# Patient Record
Sex: Female | Born: 1970
Health system: Southern US, Community
[De-identification: ages and names within clinical notes are randomized; demographics above are authoritative.]

## PROBLEM LIST (undated history)

## (undated) DIAGNOSIS — I639 Cerebral infarction, unspecified: Secondary | ICD-10-CM

## (undated) DIAGNOSIS — O34219 Maternal care for unspecified type scar from previous cesarean delivery: Secondary | ICD-10-CM

## (undated) DIAGNOSIS — F445 Conversion disorder with seizures or convulsions: Secondary | ICD-10-CM

## (undated) DIAGNOSIS — G43909 Migraine, unspecified, not intractable, without status migrainosus: Secondary | ICD-10-CM

## (undated) DIAGNOSIS — E785 Hyperlipidemia, unspecified: Secondary | ICD-10-CM

## (undated) DIAGNOSIS — D649 Anemia, unspecified: Secondary | ICD-10-CM

## (undated) DIAGNOSIS — F419 Anxiety disorder, unspecified: Secondary | ICD-10-CM

## (undated) DIAGNOSIS — G4730 Sleep apnea, unspecified: Secondary | ICD-10-CM

## (undated) DIAGNOSIS — F329 Major depressive disorder, single episode, unspecified: Secondary | ICD-10-CM

## (undated) DIAGNOSIS — F32A Depression, unspecified: Secondary | ICD-10-CM

## (undated) DIAGNOSIS — R0600 Dyspnea, unspecified: Secondary | ICD-10-CM

## (undated) DIAGNOSIS — G2581 Restless legs syndrome: Secondary | ICD-10-CM

## (undated) DIAGNOSIS — K589 Irritable bowel syndrome without diarrhea: Secondary | ICD-10-CM

## (undated) DIAGNOSIS — I10 Essential (primary) hypertension: Secondary | ICD-10-CM

## (undated) DIAGNOSIS — K219 Gastro-esophageal reflux disease without esophagitis: Secondary | ICD-10-CM

## (undated) HISTORY — DX: Migraine, unspecified, not intractable, without status migrainosus: G43.909

## (undated) HISTORY — DX: Gastro-esophageal reflux disease without esophagitis: K21.9

## (undated) HISTORY — PX: DILATION AND CURETTAGE OF UTERUS: SHX78

## (undated) HISTORY — DX: Hyperlipidemia, unspecified: E78.5

## (undated) HISTORY — DX: Irritable bowel syndrome, unspecified: K58.9

## (undated) HISTORY — DX: Maternal care for unspecified type scar from previous cesarean delivery: O34.219

---

## 1997-02-18 HISTORY — PX: HERNIA REPAIR: SHX51

## 1997-04-01 ENCOUNTER — Inpatient Hospital Stay (HOSPITAL_COMMUNITY): Admission: AD | Admit: 1997-04-01 | Discharge: 1997-04-02 | Payer: Self-pay | Admitting: Obstetrics and Gynecology

## 1997-04-18 ENCOUNTER — Inpatient Hospital Stay (HOSPITAL_COMMUNITY): Admission: AD | Admit: 1997-04-18 | Discharge: 1997-04-20 | Payer: Self-pay | Admitting: Obstetrics and Gynecology

## 1997-04-21 ENCOUNTER — Inpatient Hospital Stay (HOSPITAL_COMMUNITY): Admission: AD | Admit: 1997-04-21 | Discharge: 1997-04-21 | Payer: Self-pay | Admitting: Obstetrics and Gynecology

## 1997-05-26 ENCOUNTER — Other Ambulatory Visit: Admission: RE | Admit: 1997-05-26 | Discharge: 1997-05-26 | Payer: Self-pay | Admitting: Obstetrics and Gynecology

## 1997-11-17 ENCOUNTER — Encounter: Admission: RE | Admit: 1997-11-17 | Discharge: 1998-02-08 | Payer: Self-pay | Admitting: Anesthesiology

## 1997-11-23 ENCOUNTER — Encounter: Payer: Self-pay | Admitting: Anesthesiology

## 1997-11-23 ENCOUNTER — Ambulatory Visit (HOSPITAL_COMMUNITY): Admission: RE | Admit: 1997-11-23 | Discharge: 1997-11-23 | Payer: Self-pay | Admitting: Anesthesiology

## 2000-02-28 ENCOUNTER — Other Ambulatory Visit: Admission: RE | Admit: 2000-02-28 | Discharge: 2000-02-28 | Payer: Self-pay | Admitting: Obstetrics and Gynecology

## 2001-04-09 ENCOUNTER — Other Ambulatory Visit: Admission: RE | Admit: 2001-04-09 | Discharge: 2001-04-09 | Payer: Self-pay | Admitting: Obstetrics and Gynecology

## 2001-06-16 ENCOUNTER — Ambulatory Visit (HOSPITAL_COMMUNITY): Admission: RE | Admit: 2001-06-16 | Discharge: 2001-06-16 | Payer: Self-pay | Admitting: Obstetrics and Gynecology

## 2002-02-01 ENCOUNTER — Other Ambulatory Visit: Admission: RE | Admit: 2002-02-01 | Discharge: 2002-02-01 | Payer: Self-pay | Admitting: Obstetrics and Gynecology

## 2002-02-20 ENCOUNTER — Inpatient Hospital Stay (HOSPITAL_COMMUNITY): Admission: AD | Admit: 2002-02-20 | Discharge: 2002-02-20 | Payer: Self-pay | Admitting: Obstetrics and Gynecology

## 2002-02-20 ENCOUNTER — Encounter: Payer: Self-pay | Admitting: Obstetrics and Gynecology

## 2003-03-16 ENCOUNTER — Other Ambulatory Visit: Admission: RE | Admit: 2003-03-16 | Discharge: 2003-03-16 | Payer: Self-pay | Admitting: Obstetrics and Gynecology

## 2003-03-18 ENCOUNTER — Ambulatory Visit (HOSPITAL_COMMUNITY): Admission: RE | Admit: 2003-03-18 | Discharge: 2003-03-18 | Payer: Self-pay | Admitting: Obstetrics and Gynecology

## 2004-02-19 HISTORY — PX: KNEE ARTHROSCOPY: SHX127

## 2004-02-28 ENCOUNTER — Ambulatory Visit: Payer: Self-pay

## 2004-04-19 ENCOUNTER — Observation Stay: Payer: Self-pay | Admitting: Obstetrics & Gynecology

## 2004-04-27 ENCOUNTER — Inpatient Hospital Stay: Payer: Self-pay

## 2004-08-22 ENCOUNTER — Ambulatory Visit: Payer: Self-pay | Admitting: Unknown Physician Specialty

## 2004-09-28 ENCOUNTER — Ambulatory Visit: Payer: Self-pay | Admitting: Unknown Physician Specialty

## 2004-09-30 ENCOUNTER — Emergency Department: Payer: Self-pay | Admitting: Emergency Medicine

## 2005-06-01 ENCOUNTER — Ambulatory Visit: Payer: Self-pay | Admitting: Family Medicine

## 2005-07-10 ENCOUNTER — Emergency Department (HOSPITAL_COMMUNITY): Admission: EM | Admit: 2005-07-10 | Discharge: 2005-07-10 | Payer: Self-pay | Admitting: Emergency Medicine

## 2005-08-01 ENCOUNTER — Ambulatory Visit: Payer: Self-pay | Admitting: Family Medicine

## 2005-08-27 ENCOUNTER — Ambulatory Visit: Payer: Self-pay | Admitting: Family Medicine

## 2006-01-09 ENCOUNTER — Inpatient Hospital Stay: Payer: Self-pay | Admitting: Obstetrics & Gynecology

## 2006-10-14 ENCOUNTER — Ambulatory Visit: Payer: Self-pay | Admitting: Family Medicine

## 2007-01-21 ENCOUNTER — Ambulatory Visit: Payer: Self-pay | Admitting: Obstetrics & Gynecology

## 2007-01-22 ENCOUNTER — Ambulatory Visit: Payer: Self-pay | Admitting: Obstetrics & Gynecology

## 2008-01-09 ENCOUNTER — Ambulatory Visit: Payer: Self-pay

## 2008-01-15 ENCOUNTER — Observation Stay: Payer: Self-pay | Admitting: Obstetrics & Gynecology

## 2008-01-20 ENCOUNTER — Inpatient Hospital Stay: Payer: Self-pay

## 2008-03-23 ENCOUNTER — Ambulatory Visit: Payer: Self-pay | Admitting: Surgery

## 2008-03-28 ENCOUNTER — Ambulatory Visit: Payer: Self-pay | Admitting: Surgery

## 2008-03-29 ENCOUNTER — Ambulatory Visit: Payer: Self-pay | Admitting: Surgery

## 2008-08-05 ENCOUNTER — Ambulatory Visit: Payer: Self-pay | Admitting: Family Medicine

## 2008-09-02 ENCOUNTER — Ambulatory Visit: Payer: Self-pay | Admitting: Family Medicine

## 2008-11-14 ENCOUNTER — Encounter: Payer: Self-pay | Admitting: Family Medicine

## 2008-11-18 ENCOUNTER — Encounter: Payer: Self-pay | Admitting: Family Medicine

## 2008-12-19 ENCOUNTER — Encounter: Payer: Self-pay | Admitting: Family Medicine

## 2009-03-07 ENCOUNTER — Ambulatory Visit: Payer: Self-pay | Admitting: Family Medicine

## 2009-07-13 ENCOUNTER — Encounter: Payer: Self-pay | Admitting: Obstetrics and Gynecology

## 2009-08-10 ENCOUNTER — Encounter: Payer: Self-pay | Admitting: Maternal & Fetal Medicine

## 2009-08-18 ENCOUNTER — Encounter: Payer: Self-pay | Admitting: Maternal & Fetal Medicine

## 2009-11-17 ENCOUNTER — Inpatient Hospital Stay: Payer: Self-pay | Admitting: Obstetrics and Gynecology

## 2009-11-21 LAB — PATHOLOGY REPORT

## 2011-02-15 ENCOUNTER — Ambulatory Visit: Payer: Self-pay | Admitting: Vascular Surgery

## 2011-02-19 HISTORY — DX: Maternal care for unspecified type scar from previous cesarean delivery: O34.219

## 2011-02-19 LAB — HM MAMMOGRAPHY: HM MAMMO: NORMAL

## 2011-02-19 NOTE — L&D Delivery Note (Signed)
Delivery Note At 12:57 PM a viable female was delivered via Vaginal, Spontaneous Delivery (Presentation: Left Occiput Anterior).  APGAR: 7,8 ; weight 5 lb 5 oz (2410 g).   Placenta status: Manual removal of placenta with succenturiate lobe , Cord: 3 vessels with the following complications: None.  Cord pH: pending Cord blood collected for private collection.  Anesthesia: Epidural  Episiotomy: None Lacerations: None Suture Repair: n/a Est. Blood Loss (mL): 400  Mom to postpartum.  Baby to NICU.  Alicia Mattie H. 01/24/2012, 1:25 PM

## 2011-02-27 ENCOUNTER — Ambulatory Visit: Payer: Self-pay | Admitting: Obstetrics & Gynecology

## 2011-07-25 ENCOUNTER — Observation Stay: Payer: Self-pay

## 2011-07-25 LAB — ELECTROLYTE PANEL
Anion Gap: 9 (ref 7–16)
Chloride: 107 mmol/L (ref 98–107)
Co2: 25 mmol/L (ref 21–32)
Potassium: 4 mmol/L (ref 3.5–5.1)
Sodium: 141 mmol/L (ref 136–145)

## 2011-07-25 LAB — TSH: Thyroid Stimulating Horm: 0.386 u[IU]/mL — ABNORMAL LOW

## 2012-01-17 ENCOUNTER — Inpatient Hospital Stay: Payer: Self-pay | Admitting: Obstetrics & Gynecology

## 2012-01-18 ENCOUNTER — Inpatient Hospital Stay (HOSPITAL_COMMUNITY)
Admission: AD | Admit: 2012-01-18 | Discharge: 2012-01-25 | DRG: 372 | Disposition: A | Discharge status: Home / Self Care | Payer: BC Managed Care – PPO | Source: Ambulatory Visit | Attending: Family Medicine | Admitting: Family Medicine

## 2012-01-18 ENCOUNTER — Inpatient Hospital Stay (HOSPITAL_COMMUNITY): Payer: BC Managed Care – PPO

## 2012-01-18 ENCOUNTER — Encounter (HOSPITAL_COMMUNITY): Payer: Self-pay | Admitting: Family Medicine

## 2012-01-18 DIAGNOSIS — O34219 Maternal care for unspecified type scar from previous cesarean delivery: Secondary | ICD-10-CM | POA: Diagnosis present

## 2012-01-18 DIAGNOSIS — O09529 Supervision of elderly multigravida, unspecified trimester: Secondary | ICD-10-CM

## 2012-01-18 DIAGNOSIS — O411090 Infection of amniotic sac and membranes, unspecified, unspecified trimester, not applicable or unspecified: Secondary | ICD-10-CM

## 2012-01-18 DIAGNOSIS — O4290 Premature rupture of membranes, unspecified as to length of time between rupture and onset of labor, unspecified weeks of gestation: Secondary | ICD-10-CM

## 2012-01-18 DIAGNOSIS — O42919 Preterm premature rupture of membranes, unspecified as to length of time between rupture and onset of labor, unspecified trimester: Secondary | ICD-10-CM

## 2012-01-18 LAB — OB RESULTS CONSOLE ABO/RH: RH Type: POSITIVE

## 2012-01-18 LAB — CBC WITH DIFFERENTIAL/PLATELET
Basophil #: 0 10*3/uL (ref 0.0–0.1)
Eosinophil %: 0.3 %
Lymphocyte #: 0.9 10*3/uL — ABNORMAL LOW (ref 1.0–3.6)
MCV: 78 fL — ABNORMAL LOW (ref 80–100)
Monocyte #: 0.3 x10 3/mm (ref 0.2–0.9)
Neutrophil %: 87.5 %
Platelet: 251 10*3/uL (ref 150–440)
RBC: 3.67 10*6/uL — ABNORMAL LOW (ref 3.80–5.20)
RDW: 16 % — ABNORMAL HIGH (ref 11.5–14.5)
WBC: 9.1 10*3/uL (ref 3.6–11.0)

## 2012-01-18 LAB — RPR: RPR Ser Ql: NONREACTIVE

## 2012-01-18 LAB — WET PREP, GENITAL: Trich, Wet Prep: NONE SEEN

## 2012-01-18 LAB — OB RESULTS CONSOLE GBS: GBS: NEGATIVE

## 2012-01-18 LAB — CBC
Hemoglobin: 9 g/dL — ABNORMAL LOW (ref 12.0–15.0)
MCH: 25.8 pg — ABNORMAL LOW (ref 26.0–34.0)
Platelets: 205 10*3/uL (ref 150–400)
RBC: 3.49 MIL/uL — ABNORMAL LOW (ref 3.87–5.11)
WBC: 12.9 10*3/uL — ABNORMAL HIGH (ref 4.0–10.5)

## 2012-01-18 LAB — CREATININE, SERUM: GFR calc Af Amer: >90 mL/min (ref >90)

## 2012-01-18 LAB — OB RESULTS CONSOLE PLATELET COUNT: Platelets: 251 10*3/uL

## 2012-01-18 LAB — OB RESULTS CONSOLE HGB/HCT, BLOOD: Hemoglobin: 10 g/dL

## 2012-01-18 LAB — RAPID HIV SCREEN (WH-MAU): Rapid HIV Screen: NONREACTIVE

## 2012-01-18 LAB — OB RESULTS CONSOLE HIV ANTIBODY (ROUTINE TESTING): HIV: NONREACTIVE

## 2012-01-18 LAB — OB RESULTS CONSOLE ANTIBODY SCREEN: Antibody Screen: NEGATIVE

## 2012-01-18 LAB — PREPARE RBC (CROSSMATCH)

## 2012-01-18 MED ORDER — LACTATED RINGERS IV SOLN: 500.0000 mL | INTRAVENOUS | Status: DC | PRN

## 2012-01-18 MED ORDER — BETAMETHASONE SOD PHOS & ACET 6 (3-3) MG/ML IJ SUSP
12.0000 mg | Freq: Once | INTRAMUSCULAR | Status: AC
  Administered 2012-01-18: 12 mg via INTRAMUSCULAR
  Filled 2012-01-18: qty 2

## 2012-01-18 MED ORDER — LACTATED RINGERS IV SOLN: INTRAVENOUS | Status: DC

## 2012-01-18 MED ORDER — OXYCODONE-ACETAMINOPHEN 5-325 MG PO TABS: 1.0000 | ORAL_TABLET | Freq: Once | ORAL | Status: DC

## 2012-01-18 MED ORDER — CITRIC ACID-SODIUM CITRATE 334-500 MG/5ML PO SOLN: 30.0000 mL | ORAL | Status: DC | PRN

## 2012-01-18 MED ORDER — BUTORPHANOL TARTRATE 1 MG/ML IJ SOLN
2.0000 mg | Freq: Once | INTRAMUSCULAR | Status: AC
Start: 2012-01-18 — End: 2012-01-18
  Administered 2012-01-18: 2 mg via INTRAVENOUS

## 2012-01-18 MED ORDER — IBUPROFEN 600 MG PO TABS: 600.0000 mg | ORAL_TABLET | Freq: Four times a day (QID) | ORAL | Status: DC | PRN

## 2012-01-18 MED ORDER — FLEET ENEMA 7-19 GM/118ML RE ENEM: 1.0000 | ENEMA | RECTAL | Status: DC | PRN

## 2012-01-18 MED ORDER — LACTATED RINGERS IV SOLN
INTRAVENOUS | Status: DC
  Administered 2012-01-18 – 2012-01-19 (×3): via INTRAVENOUS

## 2012-01-18 MED ORDER — OXYCODONE HCL 5 MG PO TABS
5.0000 mg | ORAL_TABLET | Freq: Once | ORAL | Status: DC
Start: 2012-01-18 — End: 2012-01-18

## 2012-01-18 MED ORDER — ONDANSETRON HCL 4 MG/2ML IJ SOLN: 4.0000 mg | Freq: Four times a day (QID) | INTRAMUSCULAR | Status: DC | PRN

## 2012-01-18 MED ORDER — CLINDAMYCIN PHOSPHATE 900 MG/50ML IV SOLN
900.0000 mg | Freq: Three times a day (TID) | INTRAVENOUS | Status: DC
  Administered 2012-01-18 – 2012-01-20 (×7): 900 mg via INTRAVENOUS
  Filled 2012-01-18 (×8): qty 50

## 2012-01-18 MED ORDER — OXYTOCIN BOLUS FROM INFUSION: 500.0000 mL | INTRAVENOUS | Status: DC

## 2012-01-18 MED ORDER — ZOLPIDEM TARTRATE 5 MG PO TABS
5.0000 mg | ORAL_TABLET | Freq: Every evening | ORAL | Status: DC | PRN
  Administered 2012-01-18 – 2012-01-23 (×5): 5 mg via ORAL
  Filled 2012-01-18 (×5): qty 1

## 2012-01-18 MED ORDER — DEXTROSE 5 % IV SOLN
160.0000 mg | Freq: Three times a day (TID) | INTRAVENOUS | Status: DC
  Administered 2012-01-18 – 2012-01-20 (×6): 160 mg via INTRAVENOUS
  Filled 2012-01-18 (×8): qty 4

## 2012-01-18 MED ORDER — OXYTOCIN 40 UNITS IN LACTATED RINGERS INFUSION - SIMPLE MED: 62.5000 mL/h | INTRAVENOUS | Status: DC

## 2012-01-18 MED ORDER — AZITHROMYCIN 500 MG PO TABS
1000.0000 mg | ORAL_TABLET | Freq: Once | ORAL | Status: AC
  Administered 2012-01-18: 1000 mg via ORAL
  Filled 2012-01-18: qty 2

## 2012-01-18 MED ORDER — BUTORPHANOL TARTRATE 1 MG/ML IJ SOLN
INTRAMUSCULAR | Status: AC
  Administered 2012-01-18: 2 mg via INTRAVENOUS
  Filled 2012-01-18: qty 2

## 2012-01-18 MED ORDER — LIDOCAINE HCL (PF) 1 % IJ SOLN: 30.0000 mL | INTRAMUSCULAR | Status: DC | PRN

## 2012-01-18 MED ORDER — OXYCODONE-ACETAMINOPHEN 5-325 MG PO TABS: 1.0000 | ORAL_TABLET | ORAL | Status: DC | PRN

## 2012-01-18 MED ORDER — ACETAMINOPHEN 325 MG PO TABS: 650.0000 mg | ORAL_TABLET | ORAL | Status: DC | PRN

## 2012-01-18 NOTE — Consult Note (Signed)
Neonatology Consult  Note:  At the request of the patients obstetrician Dr. Shawnie Pons I met with the surrogate parents of the patient (surrogate mother asleep secondary to stadol).  Birth mother is a 41 y.o. G11 P8 female with EDC 03/07/12 at 33.[redacted] weeks gestation who was admitted for PPROM. Her current obstetrical history is significant for advanced maternal age, prior c-section for twins in 2011, and placenta succenturia.   Pt is a surrogate mother; biological mother with FH of tetrology of Fallot. Fetal echo was scheduled in Sept at [facility] but records not immediately available.  Is on latency antibiotics and betamethasone with the first dose given at [facility] prior to txfer, at 2215 on 11/29. We reviewed initial delivery room management, including CPAP, [place], and low but certainly possible need for intubation for surfactant administration.  We discussed feeding immaturity and need for full po intake with multiple days of good weight gain and no apnea or bradycardia before discharge.  We reviewed increased risk of jaundice, infection, and temperature instability.   Discussed likely length of stay.  Couple have a 47 year old daughter who was a former 33 week prematurity, who is doing well now.  She was in the NICU about 1 month and had problems related to jaundice, feeding and aeneas.  Also have a 41 year old at home.  Reside in [place]. Thank you for allowing [place] to participate in her care.  Please call with questions. Face to face time 30 min.  John Giovanni, DO  Neonatologist

## 2012-01-18 NOTE — Progress Notes (Signed)
Patient ID: Alicia [name], female   DOB: [birth year], 41 y.o.   MRN: [id number] Called to evaluate patient complaining of lowe abdominal pain radiating to both flank. Patient describes generalized cramping pain, no contractions with the addition of a sharp pain in lower abdomen. She reports good fetal movement, no vaginal bleeding.  Filed Vitals:   01/18/12 0826  BP: 111/77  Pulse: 111  Temp: 98.1 F (36.7 C)  Resp: 18   Abd: soft, gravid, NT, non-tender over lower abdomen, no rebound, no guarding Ext: NT, equal in size FHT: baseline 120, moderate variability, +accels, no decels Toco: no contractions  A/P 41 yo with PPROM at 33 weeks and plans to [facility] -FHT category I - will give stadol for pain management - continue close monitoring - Discussed concern for uterine rupture given location of pain. Also discussed possibility of repeat c-section without trial of labor. Patient understands but remains very hesitant about repeat c-section and desires to wait it out a little more

## 2012-01-18 NOTE — Progress Notes (Signed)
ANTIBIOTIC CONSULT NOTE - INITIAL  Pharmacy Consult for gentamicin Indication: PPROM  Allergies  Allergen Reactions  . Penicillins Hives and Swelling    Patient Measurements: Height: 5\' 8"  (172.7 cm) Weight: 185 lb (83.915 kg) IBW/kg (Calculated) : 63.9  Adjusted Body Weight: 70 kg  Vital Signs: Temp: 98 F (36.7 C) (11/30 0609) Temp src: Oral (11/30 0609) BP: 109/57 mmHg (11/30 0609) Pulse Rate: 105  (11/30 0609)  Labs:  Basename 01/18/12 0020  WBC --  HGB 10.0  PLT 251  LABCREA --  CREATININE --   Microbiology: Recent Results (from the past 720 hour(s))  WET PREP, GENITAL     Status: Abnormal   Collection Time   01/18/12  5:35 AM      Component Value Range Status Comment   Yeast Wet Prep HPF POC NONE SEEN  NONE SEEN Final    Trich, Wet Prep NONE SEEN  NONE SEEN Final    Clue Cells Wet Prep HPF POC RARE (*) NONE SEEN Final    WBC, Wet Prep HPF POC FEW (*) NONE SEEN Final MODERATE BACTERIA SEEN   Medications started at [facility] prior to transfer: Clindamycin 900 mg IV q8h last dose at 2300 on 11/29  Gentamicin last dose at 0018 on 11/30 (dose not known at this time)  Assessment: Pt is a 41 yo G11P9 at 33w gestation admitted at [facility] for PPROM. Pt transferred to [facility] due to full NICU at [facility]. Being continued on clindamycin and gentamicin for PPROM.   Goal of Therapy:  Gentamicin peak 6-8 mcg/ml  Trough <1 mcg/ml  Plan:  1. Gentamicin 160mg  IV q8h, first dose at 0800 on 11/30 2. Will check SCr with am labs 3. Will check levels if continued after 3 days  Alicia [name] [place] 01/18/2012,6:16 AM

## 2012-01-18 NOTE — Progress Notes (Signed)
Biological parents at the bedside - providing support to the mother.

## 2012-01-18 NOTE — Progress Notes (Signed)
Patient reports feeling much better. Pain has subsided. She reports good fetal movement, occasional contractions, persistent leakage of fluid and no vaginal bleeding.  Filed Vitals:   01/18/12 1521  BP: 115/47  Pulse: 119  Temp: 98.1 F (36.7 C)  Resp: 20    Abd: Soft, gravid, NT, no reproducible pain on exam Ext: NT, equal in size FHT: baseline 120, moderate variability, +accels, no decels Toco: rare contractions  A/P 41 yo at 33w with PPROM and previous c-section with desire to TOLAC - Fetal status remains category I - Continue current management care - Will return to regular diet  - Second dose of steroids this evening - will check CBG in am

## 2012-01-18 NOTE — H&P (Signed)
I examined pt and agree with documentation above and resident plan of care. MUHAMMAD,Dan Dissinger  

## 2012-01-18 NOTE — Progress Notes (Signed)
Patient doing well this morning, reports occasional contractions. Good fetal movement, no vaginal bleeding.  Filed Vitals:   01/18/12 0703  BP: 110/52  Pulse: 100  Temp:   Resp: 18   Physical exam:  Abd: soft, gravid, NT Ext: NT, equal in size EFM: baseline 125, moderate variability, positive accels, no decels Toco: rare contractions  A/P 41 yo [id number] at 33 weeks with PPROM - Continue monitoring for si/sx of chorio - Continue latency antibiotics- will add 1 dose of azythromycin 1 gm - Second dose of BMX due this evening - Patient with previous c-section secondary to breech twins and desires TOLAC. Reviewed risks and benefits of TOLAC. Patient verbalized understanding and wishes to proceed. Will have patient sign the consent form.

## 2012-01-18 NOTE — H&P (Signed)
Subjective:  GAYLYN BERISH is a 41 y.o. G11 P24 female with EDC 03/07/12 at 33.[redacted] weeks gestation who is being admitted for PPROM.  Her current obstetrical history is significant for advanced maternal age, prior c-section for twins in 2011, and placenta succenturia. Pt presented to [facility] and was transferred here to [facility] due to [facility] N[facility] being full; neonatalogist there recommended transfer and pt was accepted by Dr. Shawnie Pons. Patient reports contractions since around 0200, no bleeding and pain only with contractions, rated 1/10. Fetal Movement: normal.   Followed by Consuella Lose in [place]. Reports no complications with this pregnancy.  ROS: As above. Additionally denies headache/blurry vision, fever/chillls, RUQ pain, new/worse swelling. Denies bleeding. ROM as above.  Objective:   Vital signs in last 24 hours: Temp:  [98.6 F (37 C)] 98.6 F (37 C) (11/30 0405) Pulse Rate:  [101] 101  (11/30 0405) Resp:  [16] 16  (11/30 0405) BP: (97)/(71) 97/71 mmHg (11/30 0405)  General:   alert, cooperative and no distress  Skin:   normal  HEENT:  PERRLA and extra ocular movement intact  Lungs:   clear to auscultation bilaterally and no wheezes  Heart:   regular rate and rhythm, S1, S2 normal, no murmur, click, rub or gallop  Breasts:   exam deferred  Abdomen:  soft, non-tender; bowel sounds normal; no masses,  no organomegaly  Pelvis:  adequate for delivery  FHT: 120 BPM, moderate variability, accels present, rare variable decels  Uterine Size: size equals dates  Presentations: unsure  Cervix: Digital exam deferred due to PPROM; cx visualized 1-2cm by sterile speculum exam                       Lab Review  A, Rh+  AFP:NML  [facility] prenatal screen negative for trisomy 21, 18, and 13  One hour GTT: Normal    Assessment/Plan:  41 y.o. [id number] at 33.[redacted] weeks gestation, presenting with PPROM.  Pregnancy complicated by [facility], prior c-section for twins in 2011, and placenta  succenturia. Pt is a surrogate mother; biological mother with FH of tetrology of Fallot. Fetal echo was scheduled in Sept at [facility] but records not immediately available.  Plan: -admit to birthing suites -abx for PPROM: gentamycin and clindamycin -betamethasone for fetal lung maturity; first dose at [facility] prior to txfer, at 2215 on 11/29 -[place] for presentation -labs  -rapid GBS and HIV screens, RPR  -wet prep, GC/Chlamydia -expectant management  Above was discussed with Roney Marion, CNM and Dr. Shawnie Pons. Bobbye Morton, MD PGY-1, [facility]

## 2012-01-19 DIAGNOSIS — O34219 Maternal care for unspecified type scar from previous cesarean delivery: Secondary | ICD-10-CM

## 2012-01-19 DIAGNOSIS — O4290 Premature rupture of membranes, unspecified as to length of time between rupture and onset of labor, unspecified weeks of gestation: Principal | ICD-10-CM

## 2012-01-19 LAB — GLUCOSE, CAPILLARY: Glucose-Capillary: 132 mg/dL — ABNORMAL HIGH (ref 70–99)

## 2012-01-19 MED ORDER — ACETAMINOPHEN 325 MG PO TABS
650.0000 mg | ORAL_TABLET | Freq: Four times a day (QID) | ORAL | Status: DC | PRN
  Administered 2012-01-19 – 2012-01-24 (×4): 650 mg via ORAL
  Filled 2012-01-19 (×4): qty 2

## 2012-01-19 MED ORDER — GLYBURIDE 2.5 MG PO TABS
2.5000 mg | ORAL_TABLET | Freq: Two times a day (BID) | ORAL | Status: DC
  Administered 2012-01-19 – 2012-01-20 (×4): 2.5 mg via ORAL
  Filled 2012-01-19 (×5): qty 1

## 2012-01-19 MED ORDER — DOCUSATE SODIUM 100 MG PO CAPS
100.0000 mg | ORAL_CAPSULE | Freq: Every day | ORAL | Status: DC
  Administered 2012-01-19 – 2012-01-23 (×5): 100 mg via ORAL
  Filled 2012-01-19 (×5): qty 1

## 2012-01-19 MED ORDER — CALCIUM CARBONATE ANTACID 500 MG PO CHEW: 2.0000 | CHEWABLE_TABLET | ORAL | Status: DC | PRN

## 2012-01-19 MED ORDER — PRENATAL MULTIVITAMIN CH
1.0000 | ORAL_TABLET | Freq: Every day | ORAL | Status: DC
  Administered 2012-01-19 – 2012-01-23 (×5): 1 via ORAL
  Filled 2012-01-19 (×5): qty 1

## 2012-01-19 NOTE — Progress Notes (Signed)
Pt. Sitting on side of bed, back from BR for void & nose bleed - (humidfier needed - per pt.).EFM adjusted - FHR - 130's.

## 2012-01-19 NOTE — Progress Notes (Signed)
Patient ID: Alicia [name], female   DOB: September 14, 1970, 41 y.o.   MRN: [id number] FACULTY PRACTICE ANTEPARTUM(COMPREHENSIVE) NOTE  Alicia [name] is a 41 y.o. [id number] at [redacted]w[redacted]d who is admitted for PPROM.   Fetal presentation is cephalic. Length of Stay:  1  Days  Subjective: Patient reports having a "rough night" secondary to lack of sleep Patient reports the fetal movement as active. Patient reports uterine contraction  activity as none. Patient reports  vaginal bleeding as none. Patient describes fluid per vagina as Other clear and occasionally light pink when she wipes.  Vitals:  Blood pressure 135/70, pulse 140, temperature 98.5 F (36.9 C), temperature source Oral, resp. rate 20, height 5\' 8"  (1.727 m), weight 83.915 kg (185 lb), SpO2 96.00%. Physical Examination:  General appearance - alert, well appearing, and in no distress Fundal Height:  size equals dates Pelvic Exam:  examination not indicated Cervical Exam: Not evaluated.  Extremities: no edema, redness or tenderness in the calves or thighs with DTRs 2+ bilaterally Membranes:ruptured  Fetal Monitoring:  Baseline: 120 bpm, Variability: Good {> 6 bpm), Accelerations: Reactive, Decelerations: Absent and TOCO: 2-3 contractions over night  Labs:  Recent Results (from the past 24 hour(s))  ABO/RH   Collection Time   01/18/12  6:34 AM      Component Value Range   ABO/RH(D) A POS    RPR   Collection Time   01/18/12  6:40 AM      Component Value Range   RPR NON REACTIVE  NON REACTIVE  TYPE AND SCREEN   Collection Time   01/18/12  6:40 AM      Component Value Range   ABO/RH(D) A POS     Antibody Screen NEG     Sample Expiration 01/21/2012     Unit Number [id number]     Blood Component Type RED CELLS,LR     Unit division 00     Status of Unit ALLOCATED     Transfusion Status OK TO TRANSFUSE     Crossmatch Result Compatible     Unit Number [id number]     Blood Component Type RED CELLS,LR     Unit  division 00     Status of Unit ALLOCATED     Transfusion Status OK TO TRANSFUSE     Crossmatch Result Compatible    RAPID HIV SCREEN [facility])   Collection Time   01/18/12  6:40 AM      Component Value Range   SUDS Rapid HIV Screen NON REACTIVE  NON REACTIVE  CREATININE, SERUM   Collection Time   01/18/12  6:40 AM      Component Value Range   Creatinine, Ser 0.55  0.50 - 1.10 mg/dL   GFR calc non Af Amer >90  >90 mL/min   GFR calc Af Amer >90  >90 mL/min  PREPARE RBC (CROSSMATCH)   Collection Time   01/18/12  7:00 AM      Component Value Range   Order Confirmation ORDER PROCESSED BY BLOOD BANK    CBC   Collection Time   01/18/12  1:36 PM      Component Value Range   WBC 12.9 (*) 4.0 - 10.5 K/uL   RBC 3.49 (*) 3.87 - 5.11 MIL/uL   Hemoglobin 9.0 (*) 12.0 - 15.0 g/dL   HCT 13.2 (*) 44.0 - 10.2 %   MCV 79.7  78.0 - 100.0 fL   MCH 25.8 (*) 26.0 - 34.0 pg   MCHC 32.4  30.0 -  36.0 g/dL   RDW 16.1 (*) 09.6 - 04.5 %   Platelets 205  150 - 400 K/uL  GLUCOSE, CAPILLARY   Collection Time   01/19/12  5:59 AM      Component Value Range   Glucose-Capillary 132 (*) 70 - 99 mg/dL    Imaging Studies:     Currently EPIC will not allow sonographic studies to automatically populate into notes.  In the meantime, copy and paste results into note or free text.  Medications:  Scheduled    . [COMPLETED] azithromycin  1,000 mg Oral Once  . [COMPLETED] betamethasone acetate-betamethasone sodium phosphate  12 mg Intramuscular Once  . [COMPLETED] butorphanol  2 mg Intravenous Once  . clindamycin (CLEOCIN) IV  900 mg Intravenous Q8H  . gentamicin  160 mg Intravenous Q8H  . [DISCONTINUED] oxyCODONE  5 mg Oral Once  . [DISCONTINUED] oxyCODONE-acetaminophen  1 tablet Oral Once   I have reviewed the patient's current medications.  ASSESSMENT: There is no problem list on file for this patient.   PLAN: 41 yo with PPROM at [redacted]w[redacted]d s/p betamethasone yesterday evening - Fetal status category I -  No si/sx of chorioamnionitis - Abdominal pain from yesterday is resolved - fasting CBG 132 - Continue latency antibiotics and close monitoring  [name] 01/19/2012,6:29 AM

## 2012-01-20 LAB — GLUCOSE, CAPILLARY
Glucose-Capillary: 80 mg/dL (ref 70–99)
Glucose-Capillary: 129 mg/dL — ABNORMAL HIGH (ref 70–99)
Glucose-Capillary: 72 mg/dL (ref 70–99)
Glucose-Capillary: 91 mg/dL (ref 70–99)

## 2012-01-20 MED ORDER — CEPHALEXIN 500 MG PO CAPS
500.0000 mg | ORAL_CAPSULE | Freq: Four times a day (QID) | ORAL | Status: DC
  Administered 2012-01-20 – 2012-01-23 (×15): 500 mg via ORAL
  Filled 2012-01-20 (×18): qty 1

## 2012-01-20 NOTE — Progress Notes (Signed)
Patient ID: Alicia [name], female   DOB: 1971/01/11, 41 y.o.   MRN: [id number]  FACULTY PRACTICE ANTEPARTUM(COMPREHENSIVE) NOTE  Alicia [name] is a 41 y.o. [id number] at [redacted]w[redacted]d  who is admitted for PPROM .   Length of Stay:  2  Days  Subjective: Patient feels better.  Splept better.  No abdominal pain. Patient reports the fetal movement as active. Patient reports uterine contraction  activity as none. Patient reports  vaginal bleeding as none. Patient describes fluid per vagina as Clear.  Vitals:  Blood pressure 100/51, pulse 99, temperature 97.7 F (36.5 C), temperature source Oral, resp. rate 18, height 5\' 8"  (1.727 m), weight 83.915 kg (185 lb), SpO2 99.00%. Physical Examination:  General appearance - alert, well appearing, and in no distress Mental status - normal mood, behavior, speech, dress, motor activity, and thought processes Abdomen - soft, no fundal tenderness Extremities - no edema, redness or tenderness in the calves or thighs, Homan's sign negative bilaterally  Fetal Monitoring:  Baseline: 130 bpm, Variability: Good {> 6 bpm), Accelerations: Reactive and Decelerations: Absent  Labs:  Recent Results (from the past 24 hour(s))  GLUCOSE, CAPILLARY   Collection Time   01/19/12 11:58 AM      Component Value Range   Glucose-Capillary 112 (*) 70 - 99 mg/dL  GLUCOSE, CAPILLARY   Collection Time   01/19/12  2:27 PM      Component Value Range   Glucose-Capillary 99  70 - 99 mg/dL  GLUCOSE, CAPILLARY   Collection Time   01/19/12  8:27 PM      Component Value Range   Glucose-Capillary 129 (*) 70 - 99 mg/dL  GLUCOSE, CAPILLARY   Collection Time   01/20/12  7:04 AM      Component Value Range   Glucose-Capillary 80  70 - 99 mg/dL     Medications:  Scheduled    . cephALEXin  500 mg Oral Q6H  . docusate sodium  100 mg Oral Daily  . glyBURIDE  2.5 mg Oral Q12H  . prenatal multivitamin  1 tablet Oral Daily  . [DISCONTINUED] clindamycin (CLEOCIN) IV  900 mg  Intravenous Q8H  . [DISCONTINUED] gentamicin  160 mg Intravenous Q8H    ASSESSMENT: Patient Active Problem List  Diagnosis  . Preterm premature rupture of membranes (PPROM) delivered, current hospitalization  . Previous cesarean delivery, delivered, with or without mention of antepartum condition    PLAN: Alicia [name] is a 41 y.o. [id number] at [redacted]w[redacted]d  who is admitted for PPROM No evidence of chorioamnionitis. CBG controlled on glyburide.  If fasting drops, will decrease evening dose to 1.25 mg Change antibiotics to po.  Pt has remote history of hives, itching at age 35 from PCN.  Discussed with patient very low cross reactivity to cephalosporins.  We will try pt on Keflex 500 qid.  If there is any allergic reaction then change to clndamycin 300 mg tid.  Ellinore Merced H. 01/20/2012,7:16 AM

## 2012-01-21 LAB — CBC
Hemoglobin: 8.7 g/dL — ABNORMAL LOW (ref 12.0–15.0)
MCH: 25.5 pg — ABNORMAL LOW (ref 26.0–34.0)
MCV: 80.6 fL (ref 78.0–100.0)
Platelets: 231 10*3/uL (ref 150–400)
RBC: 3.41 MIL/uL — ABNORMAL LOW (ref 3.87–5.11)
WBC: 10.5 10*3/uL (ref 4.0–10.5)

## 2012-01-21 LAB — TYPE AND SCREEN
ABO/RH(D): A POS
Antibody Screen: NEGATIVE
Unit division: 0

## 2012-01-21 LAB — GLUCOSE, CAPILLARY
Glucose-Capillary: 122 mg/dL — ABNORMAL HIGH (ref 70–99)
Glucose-Capillary: 119 mg/dL — ABNORMAL HIGH (ref 70–99)

## 2012-01-21 MED ORDER — GLYBURIDE 1.25 MG PO TABS
1.2500 mg | ORAL_TABLET | Freq: Two times a day (BID) | ORAL | Status: DC
Start: 2012-01-21 — End: 2012-01-24
  Administered 2012-01-21: 11:00:00 via ORAL
  Administered 2012-01-21 – 2012-01-22 (×2): 2.5 mg via ORAL
  Administered 2012-01-22 – 2012-01-23 (×3): 1.25 mg via ORAL
  Filled 2012-01-21 (×9): qty 1

## 2012-01-21 NOTE — Progress Notes (Signed)
Pt reports having more discharge than usual and appears to be pinkish in color, which is a change for her. Dr. Debroah Loop notified and pt placed on EFM and toco.

## 2012-01-21 NOTE — Progress Notes (Signed)
Received call from biological mother/Maureen who states her attorney is requesting a fax number in order to send the hospital a copy of the official birth order.  CSW gave her the fax number to [employer] and asked bedside RN to place it in patient's paper chart when it comes through.

## 2012-01-21 NOTE — Progress Notes (Signed)
01/21/12 1200  Clinical Encounter Type  Visited With Patient (pt is surrogate; adoptive mother Seward Speck also pres)  Visit Type Initial;Spiritual support;Social support  Referral From Nurse  Recommendations Spiritual Care will follow pt and adoptive family for support  Spiritual Encounters  Spiritual Needs Emotional  Stress Factors  Patient Stress Factors Loss of control (Struggling with feelings about unexpected complications)    Made initial visit with Nicole Cella, who is the surrogate mother of this baby, and Seward Speck, who is the adoptive mother.  Marisue Humble is an OB from [place] and has two children at home.  The complications of this pregnancy have brought Nicole Cella and Marisue Humble closer together physically and emotionally as they navigate new territory and get more deeply acquainted.  Provided pastoral listening and reflection, intro to chaplain services, and encouragement to consider whether there is any kind of blessing or ritual that would be helpful to any configuration of family through the planned delivery on Saturday and NICU stay thereafter.  Both Jaylee and Marisue Humble expressed gratitude.  Spiritual Care will continue to follow for support.  [address] [place], [place] [phone]

## 2012-01-21 NOTE — Clinical Social Work Psychosocial (Signed)
    Clinical Social Work Department BRIEF PSYCHOSOCIAL ASSESSMENT 01/20/2012  Patient:  Alicia [name], Alicia [name]     Account Number:  [id number]     Admit date:  01/18/2012  Clinical Social Worker:  Almeta Monas  Date/Time:  01/20/2012 02:30 PM  Referred by:  RN  Date Referred:  01/20/2012 Referred for  Other - See comment   Other Referral:   Surrogacy   Interview type:  Patient Other interview type:    PSYCHOSOCIAL DATA Living Status:  FAMILY Admitted from facility:   Level of care:   Primary support name:  Charlton Haws Primary support relationship to patient:  SPOUSE Degree of support available:   Good supports    CURRENT CONCERNS Current Concerns  None Noted   Other Concerns:    SOCIAL WORK ASSESSMENT / PLAN CSW met with surrogate mother and biological mother to provide support as surrogate mother has now been hospitalized until delivery.  CSW ensured that biological mother has necessary paperwork for the surrogacy.   Assessment/plan status:  Psychosocial Support/Ongoing Assessment of Needs Other assessment/ plan:   Information/referral to community resources:   None needed    PATIENT'S/FAMILY'S RESPONSE TO PLAN OF CARE: Surrogate mother and biological mother are both in good spirits.  They were scheduled to appear before the judge to sign paperwork this Thursday, but since surrogate mother has been hospitalized, they are requesting that paperwork be signed and notorized by CSW in order for them to submit it to the judge.  CSW notorized signatures for surrogate parents and biological mother.  Biological father is in [place] and will have his signature notorized as well.  Both parties have good supports and appear to be coping well with the situation.  Biological mother states her daughter was born prematurely at 97 weeks and spent a month in the NICU in [place].  She and her husband are prepared to travel back and forth from [place] to [place] to be with baby and their two children at  home.  All parties were appreciative of CSW's visit and assistance and state no further questions or needs at this time.  Biological mother understands that CSW will need a copy of the official birth order once it is signed by the judge.

## 2012-01-21 NOTE — Progress Notes (Signed)
Patient ID: ATIYAH BAUER, female   DOB: Aug 18, 1970, 41 y.o.   MRN: [id number]  FACULTY PRACTICE ANTEPARTUM(COMPREHENSIVE) NOTE  DANNIELLE BASKINS is a 41 y.o. [id number] at [redacted]w[redacted]d  who is admitted for PPROM.   Length of Stay:  3  Days  Subjective: Pt is feeling well. Patient reports the fetal movement as active. Patient reports uterine contraction  activity as none. Patient reports  vaginal bleeding as none. Patient describes fluid per vagina as Other blood tinged fluid (pad examined and it is light pink).  Vitals:  Blood pressure 100/48, pulse 100, temperature 97.9 F (36.6 C), temperature source Oral, resp. rate 18, height 5\' 8"  (1.727 m), weight 83.915 kg (185 lb), SpO2 99.00%. Physical Examination:  General appearance - alert, well appearing, and in no distress Mental status - normal mood, behavior, speech, dress, motor activity, and thought processes Chest - clear to auscultation, no wheezes, rales or rhonchi, symmetric air entry Heart - normal rate and regular rhythm Abdomen - soft, no fundal tenderness Extremities - no pedal edema noted, no edema, redness or tenderness in the calves or thighs, Homan's sign negative bilaterally  Fetal Monitoring:  Baseline: 140 bpm, Variability: Good {> 6 bpm), Accelerations: Reactive and Decelerations: Absent  Labs:  Recent Results (from the past 24 hour(s))  GLUCOSE, CAPILLARY   Collection Time   01/20/12 10:39 AM      Component Value Range   Glucose-Capillary 129 (*) 70 - 99 mg/dL   Comment 1 Notify RN     Comment 2 Documented in Chart    GLUCOSE, CAPILLARY   Collection Time   01/20/12  4:19 PM      Component Value Range   Glucose-Capillary 72  70 - 99 mg/dL   Comment 1 Notify RN     Comment 2 Documented in Chart    GLUCOSE, CAPILLARY   Collection Time   01/20/12  7:32 PM      Component Value Range   Glucose-Capillary 91  70 - 99 mg/dL   Comment 1 Notify RN     Comment 2 Documented in Chart    GLUCOSE, CAPILLARY   Collection  Time   01/21/12  6:19 AM      Component Value Range   Glucose-Capillary 65 (*) 70 - 99 mg/dL    Medications:  Scheduled    . cephALEXin  500 mg Oral Q6H  . docusate sodium  100 mg Oral Daily  . glyBURIDE  1.25 mg Oral Q12H  . prenatal multivitamin  1 tablet Oral Daily  . [DISCONTINUED] glyBURIDE  2.5 mg Oral Q12H    ASSESSMENT: Patient Active Problem List  Diagnosis  . Preterm premature rupture of membranes (PPROM) delivered, current hospitalization  . Previous cesarean delivery, delivered, with or without mention of antepartum condition   CBG (last 3)   Basename 01/21/12 0619 01/20/12 1932 01/20/12 1619  GLUCAP 65* 91 72      PLAN: MARGUERITA STAPP is a 41 y.o. [id number] at [redacted]w[redacted]d  who is admitted for PPROM.   1.  Pt tolerating oral Keflex well. 2.  CBG are on the low side.  Will decrease glyburide to 1.25 mg bid.  Will d/c if cbg remain low. 3.  Discussed induction on Saturday morning at 4 a.m. with foley bulb and pitocin.  LEGGETT,KELLY H. 01/21/2012,10:15 AM

## 2012-01-21 NOTE — Progress Notes (Signed)
Pt's fasting CBG was 65. Pt asymptomatic. Pt given juice to drink. No acute distress noted at this time.  Will continue to monitor pt.

## 2012-01-21 NOTE — Progress Notes (Signed)
UR completed 

## 2012-01-22 LAB — VITAMIN B12: Vitamin B-12: 260 pg/mL (ref 211–911)

## 2012-01-22 LAB — GLUCOSE, CAPILLARY
Glucose-Capillary: 86 mg/dL (ref 70–99)
Glucose-Capillary: 162 mg/dL — ABNORMAL HIGH (ref 70–99)
Glucose-Capillary: 129 mg/dL — ABNORMAL HIGH (ref 70–99)
Glucose-Capillary: 128 mg/dL — ABNORMAL HIGH (ref 70–99)

## 2012-01-22 LAB — FERRITIN: Ferritin: 4 ng/mL — ABNORMAL LOW (ref 10–291)

## 2012-01-22 MED ORDER — FERUMOXYTOL INJECTION 510 MG/17 ML
510.0000 mg | Freq: Once | INTRAVENOUS | Status: AC
  Administered 2012-01-22: 510 mg via INTRAVENOUS
  Filled 2012-01-22: qty 17

## 2012-01-22 MED ORDER — LACTATED RINGERS IV SOLN
Freq: Once | INTRAVENOUS | Status: AC
Start: 2012-01-22 — End: 2012-01-23
  Administered 2012-01-23: 23:00:00 via INTRAVENOUS

## 2012-01-22 MED ORDER — TETANUS-DIPHTH-ACELL PERTUSSIS 5-2.5-18.5 LF-MCG/0.5 IM SUSP
0.5000 mL | Freq: Once | INTRAMUSCULAR | Status: AC
  Administered 2012-01-23: 0.5 mL via INTRAMUSCULAR
  Filled 2012-01-22: qty 0.5

## 2012-01-22 NOTE — Progress Notes (Signed)
Patient ID: SHANEEKA SCARBORO, female   DOB: 11-26-1970, 41 y.o.   MRN: [id number]  FACULTY PRACTICE ANTEPARTUM(COMPREHENSIVE) NOTE  MEEKAH MATH is a 41 y.o. [id number] at [redacted]w[redacted]d  who is admitted for PPROM .   Length of Stay:  4  Days  Subjective: No Complaints Patient reports the fetal movement as active. Patient reports uterine contraction  activity as none. Patient reports  vaginal bleeding as none. Patient describes fluid per vagina as Clear.  Vitals:  Blood pressure 104/51, pulse 97, temperature 97.9 F (36.6 C), temperature source Oral, resp. rate 20, height 5\' 8"  (1.727 m), weight 175 lb 12.8 oz (79.742 kg), SpO2 99.00%. Physical Examination:  General appearance - alert, well appearing, and in no distress Abdomen - soft, nontender Extremities - no edema, redness or tenderness in the calves or thighs, Homan's sign negative bilaterally  Fetal Monitoring:  Baseline: 130 bpm, Variability: Good {> 6 bpm), Accelerations: Reactive and Decelerations: Absent  Labs:  Recent Results (from the past 24 hour(s))  GLUCOSE, CAPILLARY   Collection Time   01/21/12  3:25 PM      Component Value Range   Glucose-Capillary 122 (*) 70 - 99 mg/dL   Comment 1 Notify RN     Comment 2 Documented in Chart    GLUCOSE, CAPILLARY   Collection Time   01/21/12  7:53 PM      Component Value Range   Glucose-Capillary 119 (*) 70 - 99 mg/dL   Comment 1 Notify RN     Comment 2 Documented in Chart    GLUCOSE, CAPILLARY   Collection Time   01/22/12  6:50 AM      Component Value Range   Glucose-Capillary 86  70 - 99 mg/dL  GLUCOSE, CAPILLARY   Collection Time   01/22/12 11:14 AM      Component Value Range   Glucose-Capillary 162 (*) 70 - 99 mg/dL   Comment 1 Notify RN     Comment 2 Documented in Chart     CBC    Component Value Date/Time   WBC 10.5 01/21/2012 1003   RBC 3.41* 01/21/2012 1003   HGB 8.7* 01/21/2012 1003   HGB 10.0 01/18/2012 0020   HCT 27.5* 01/21/2012 1003   PLT 231 01/21/2012  1003   PLT 251 01/18/2012 0020   MCV 80.6 01/21/2012 1003   MCH 25.5* 01/21/2012 1003   MCHC 31.6 01/21/2012 1003   RDW 16.5* 01/21/2012 1003    Medications:  Scheduled    . cephALEXin  500 mg Oral Q6H  . docusate sodium  100 mg Oral Daily  . ferumoxytol  510 mg Intravenous Once  . glyBURIDE  1.25 mg Oral Q12H  . prenatal multivitamin  1 tablet Oral Daily  . TDaP  0.5 mL Intramuscular Once    ASSESSMENT: Patient Active Problem List  Diagnosis  . Premature rupture of membranes in pregnancy, antepartum  . Previous cesarean delivery, delivered, with or without mention of antepartum condition    PLAN: CARMALITA WAKEFIELD is a 41 y.o. [id number] at [redacted]w[redacted]d  who is admitted for PPROM  1.  Continue antibiotics 2.  Anemia--will give one dose of IV iron 3.  Dtap today 4.  Flu tomorrow 5.  Plan induction on Saturday morning.  Donnajean Chesnut H. 01/22/2012,2:29 PM

## 2012-01-22 NOTE — Progress Notes (Signed)
Pt requesting NOT to keep and maintain IV site.  No orders at this time to maintain IV site.  IV removed intact.

## 2012-01-22 NOTE — Progress Notes (Signed)
IV started to give medication through.

## 2012-01-23 LAB — CBC WITH DIFFERENTIAL/PLATELET
Basophils Absolute: 0 10*3/uL (ref 0.0–0.1)
Basophils Relative: 0 % (ref 0–1)
Eosinophils Absolute: 0 10*3/uL (ref 0.0–0.7)
MCH: 25.4 pg — ABNORMAL LOW (ref 26.0–34.0)
MCHC: 31.4 g/dL (ref 30.0–36.0)
Neutro Abs: 7.2 10*3/uL (ref 1.7–7.7)
Neutrophils Relative %: 80 % — ABNORMAL HIGH (ref 43–77)
Platelets: 237 10*3/uL (ref 150–400)

## 2012-01-23 LAB — GLUCOSE, CAPILLARY
Glucose-Capillary: 79 mg/dL (ref 70–99)
Glucose-Capillary: 75 mg/dL (ref 70–99)
Glucose-Capillary: 99 mg/dL (ref 70–99)

## 2012-01-23 MED ORDER — CLINDAMYCIN PHOSPHATE 900 MG/50ML IV SOLN
900.0000 mg | Freq: Three times a day (TID) | INTRAVENOUS | Status: DC
  Filled 2012-01-23: qty 50

## 2012-01-23 MED ORDER — TERBUTALINE SULFATE 1 MG/ML IJ SOLN: 0.2500 mg | Freq: Once | INTRAMUSCULAR | Status: AC | PRN

## 2012-01-23 MED ORDER — OXYTOCIN 40 UNITS IN LACTATED RINGERS INFUSION - SIMPLE MED
1.0000 m[IU]/min | INTRAVENOUS | Status: DC
  Administered 2012-01-23: 1 m[IU]/min via INTRAVENOUS
  Administered 2012-01-24: 9 m[IU]/min via INTRAVENOUS
  Filled 2012-01-23: qty 1000

## 2012-01-23 MED ORDER — GENTAMICIN SULFATE 40 MG/ML IJ SOLN
Freq: Three times a day (TID) | INTRAVENOUS | Status: DC
  Administered 2012-01-23 – 2012-01-24 (×2): via INTRAVENOUS
  Filled 2012-01-23 (×4): qty 4.25

## 2012-01-23 MED ORDER — ACETAMINOPHEN 325 MG PO TABS
650.0000 mg | ORAL_TABLET | ORAL | Status: DC
  Administered 2012-01-24: 650 mg via ORAL
  Filled 2012-01-23: qty 2

## 2012-01-23 NOTE — Progress Notes (Addendum)
Alicia [name] is a 42 y.o. [id number] at [redacted]w[redacted]d admitted for PPROM. Now with low grade temp and abdominal/uterine tenderness.   Subjective:   Objective: BP 73/48  Pulse 154  Temp 99.3 F (37.4 C) (Oral)  Resp 18  Ht 5\' 8"  (1.727 m)  Wt 79.742 kg (175 lb 12.8 oz)  BMI 26.73 kg/m2  SpO2 99%      FHT:  FHR: 145 bpm, variability: moderate,  accelerations:  Present,  decelerations:  Absent UC:   none SVE:   Dilation: 1 Effacement (%): Thick Station: -2 Exam by:: Makenzy Krist,cnm  Labs: Lab Results  Component Value Date   WBC 9.0 01/23/2012   HGB 9.1* 01/23/2012   HCT 29.0* 01/23/2012   MCV 81.0 01/23/2012   PLT 237 01/23/2012    Assessment / Plan: IOL 2/2 PROM with presumed chorio  Labor: Foley bulb inserted. Will start pitocin Preeclampsia:  NA Fetal Wellbeing:  Category I Pain Control:  Labor support without medications and desires epidural when needed.  I/D:  n/a Anticipated MOD:  NSVD  Tawnya Crook 01/23/2012, 10:39 PM

## 2012-01-23 NOTE — Progress Notes (Signed)
Patient ID: Alicia [name], female   DOB: [birth year], 41 y.o.   MRN: [id number]  I was called to her room because she was feeling some shaking chills and her temp has changed to 99. She was given tylenol by the nurse. On exam I find her left upper uterus to be very tender. We have discussed the treatment of chorioamnionitis is delivery and antibiotics. The baby currently looks great. U/S at bedside confirms vertex lie.

## 2012-01-23 NOTE — Progress Notes (Signed)
ANTIBIOTIC CONSULT NOTE - INITIAL  Pharmacy Consult for Gentamicin Indication: R/O Chorioamnionitis  Allergies  Allergen Reactions  . Penicillins Hives and Swelling    Patient Measurements: Height: 5\' 8"  (172.7 cm) Weight: 175 lb 12.8 oz (79.742 kg) IBW/kg (Calculated) : 63.9  Adjusted Body Weight: 68.6 kg  Vital Signs: Temp: 99.3 F (37.4 C) (12/05 2112) Temp src: Oral (12/05 2112) BP: 95/47 mmHg (12/05 1952) Pulse Rate: 112  (12/05 1952) I  Labs:  Basename 01/23/12 0915 01/21/12 1003  WBC 9.0 10.5  HGB 9.1* 8.7*  PLT 237 231  LABCREA -- --  CREATININE -- --   Estimated Creatinine Clearance: 102.6 ml/min (by C-G formula based on Cr of 0.55).   Medical History: History reviewed. No pertinent past medical history.  Medications:  Gent/Clindamycin for [facility] 11/30-12/2/13. Clindamycin 900mg  iv q8h restarted 12-5 @ 2200. Assessment: 41yo F at 33weeks admitted with PPROM (transfer from [facility]) and now developed maternal temp while on oral Keflex. Gentamicin and Clindamycin re-started for presumed chorioamnionitis. Goal of Therapy:  Gentamicin peaks 6-8 mcg/ml and troughs < 7mcg/ml.  Plan:  1. Gentamicin 170mg  iv q8h (mixed with Clindamycin). 2. Will continue to follow and assess need for levels as clinically indicated.  Thanks! Claybon Jabs 01/23/2012,10:08 PM

## 2012-01-23 NOTE — Progress Notes (Signed)
Patient ID: Alicia [name], female   DOB: [birth year], 41 y.o.   MRN: [id number]  FACULTY PRACTICE ANTEPARTUM(COMPREHENSIVE) NOTE  Alicia [name] is a 41 y.o. [id number] at [redacted]w[redacted]d  who is admitted for PPROM.   Length of Stay:  5  Days  Subjective: Patient has mild abdominal tenderness. Patient reports the fetal movement as active. Patient reports uterine contraction  activity as none. Patient reports  vaginal bleeding as blood tinged fulid.. Patient describes fluid per vagina as Other pink tinge but mostly clear.  Vitals:  Blood pressure 99/44, pulse 80, temperature 98.3 F (36.8 C), temperature source Oral, resp. rate 20, height 5\' 8"  (1.727 m), weight 175 lb 12.8 oz (79.742 kg), SpO2 99.00%. Physical Examination:  General appearance - alert, well appearing, and in no distress Abdomen - soft, very mild tenderness on fundus but overall a benign exam. No guarding or rebound Extremities - no edema, redness or tenderness in the calves or thighs, Homan's sign negative bilaterally  Fetal Monitoring:  Baseline: 130 bpm, Variability: Good {> 6 bpm), Accelerations: Reactive and Decelerations: one varialbe with one contraction on NST pm on 01/22/12  Labs:  Recent Results (from the past 24 hour(s))  GLUCOSE, CAPILLARY   Collection Time   01/22/12 11:14 AM      Component Value Range   Glucose-Capillary 162 (*) 70 - 99 mg/dL   Comment 1 Notify RN     Comment 2 Documented in Chart    FERRITIN   Collection Time   01/22/12  2:25 PM      Component Value Range   Ferritin 4 (*) 10 - 291 ng/mL  VITAMIN B12   Collection Time   01/22/12  2:25 PM      Component Value Range   Vitamin B-12 260  211 - 911 pg/mL  GLUCOSE, CAPILLARY   Collection Time   01/22/12  3:43 PM      Component Value Range   Glucose-Capillary 129 (*) 70 - 99 mg/dL  GLUCOSE, CAPILLARY   Collection Time   01/22/12  9:50 PM      Component Value Range   Glucose-Capillary 128 (*) 70 - 99 mg/dL  GLUCOSE, CAPILLARY   Collection  Time   01/23/12  6:52 AM      Component Value Range   Glucose-Capillary 79  70 - 99 mg/dL    Medications:  Scheduled    . cephALEXin  500 mg Oral Q6H  . docusate sodium  100 mg Oral Daily  . [COMPLETED] ferumoxytol  510 mg Intravenous Once  . glyBURIDE  1.25 mg Oral Q12H  . lactated ringers   Intravenous Once  . prenatal multivitamin  1 tablet Oral Daily  . TDaP  0.5 mL Intramuscular Once    ASSESSMENT: Patient Active Problem List  Diagnosis  . Premature rupture of membranes in pregnancy, antepartum  . Previous cesarean delivery, delivered, with or without mention of antepartum condition    PLAN: RONAN DION is a 41 y.o. [id number] at [redacted]w[redacted]d  who is admitted for PPROM  1.  Continue bid NST 2.  Iron deficiency anemia.  Ferritin = 4.  S/p IV iron yesterday. 3.  Still needs tDap (was ordered yesterday). 4.  CBC 5.  Will watch for signs of chorio and deliver if indicated.  LEGGETT,KELLY H. 01/23/2012,8:18 AM

## 2012-01-24 ENCOUNTER — Encounter (HOSPITAL_COMMUNITY): Payer: Self-pay | Admitting: *Deleted

## 2012-01-24 ENCOUNTER — Encounter (HOSPITAL_COMMUNITY): Payer: Self-pay | Admitting: Anesthesiology

## 2012-01-24 ENCOUNTER — Inpatient Hospital Stay (HOSPITAL_COMMUNITY): Payer: BC Managed Care – PPO | Admitting: Anesthesiology

## 2012-01-24 LAB — HEPATITIS B SURFACE ANTIGEN: Hepatitis B Surface Ag: NEGATIVE

## 2012-01-24 LAB — RPR: RPR Ser Ql: NONREACTIVE

## 2012-01-24 MED ORDER — DIPHENHYDRAMINE HCL 50 MG/ML IJ SOLN: 12.5000 mg | INTRAMUSCULAR | Status: DC | PRN

## 2012-01-24 MED ORDER — SODIUM BICARBONATE 8.4 % IV SOLN
INTRAVENOUS | Status: DC | PRN
  Administered 2012-01-24: 5 mL via EPIDURAL

## 2012-01-24 MED ORDER — BENZOCAINE-MENTHOL 20-0.5 % EX AERO: 1.0000 "application " | INHALATION_SPRAY | CUTANEOUS | Status: DC | PRN

## 2012-01-24 MED ORDER — PHENYLEPHRINE 40 MCG/ML (10ML) SYRINGE FOR IV PUSH (FOR BLOOD PRESSURE SUPPORT): 80.0000 ug | PREFILLED_SYRINGE | INTRAVENOUS | Status: DC | PRN

## 2012-01-24 MED ORDER — IBUPROFEN 600 MG PO TABS
600.0000 mg | ORAL_TABLET | Freq: Four times a day (QID) | ORAL | Status: DC | PRN
  Administered 2012-01-24: 600 mg via ORAL
  Filled 2012-01-24: qty 1

## 2012-01-24 MED ORDER — DIPHENHYDRAMINE HCL 25 MG PO CAPS: 25.0000 mg | ORAL_CAPSULE | Freq: Four times a day (QID) | ORAL | Status: DC | PRN

## 2012-01-24 MED ORDER — CLINDAMYCIN PHOSPHATE 900 MG/50ML IV SOLN: 900.0000 mg | Freq: Three times a day (TID) | INTRAVENOUS | Status: DC

## 2012-01-24 MED ORDER — EPHEDRINE 5 MG/ML INJ
10.0000 mg | INTRAVENOUS | Status: DC | PRN
  Filled 2012-01-24: qty 4

## 2012-01-24 MED ORDER — BUPIVACAINE HCL (PF) 0.25 % IJ SOLN
INTRAMUSCULAR | Status: DC | PRN
  Administered 2012-01-24 (×2): 5 mL via EPIDURAL

## 2012-01-24 MED ORDER — ONDANSETRON HCL 4 MG/2ML IJ SOLN: 4.0000 mg | Freq: Four times a day (QID) | INTRAMUSCULAR | Status: DC | PRN

## 2012-01-24 MED ORDER — PRENATAL MULTIVITAMIN CH
1.0000 | ORAL_TABLET | Freq: Every day | ORAL | Status: DC
  Administered 2012-01-24: 1 via ORAL
  Filled 2012-01-24: qty 1

## 2012-01-24 MED ORDER — IBUPROFEN 600 MG PO TABS
600.0000 mg | ORAL_TABLET | Freq: Four times a day (QID) | ORAL | Status: DC
  Administered 2012-01-24 – 2012-01-25 (×3): 600 mg via ORAL
  Filled 2012-01-24 (×3): qty 1

## 2012-01-24 MED ORDER — MEASLES, MUMPS & RUBELLA VAC ~~LOC~~ INJ: 0.5000 mL | INJECTION | Freq: Once | SUBCUTANEOUS | Status: DC

## 2012-01-24 MED ORDER — EPHEDRINE 5 MG/ML INJ: 10.0000 mg | INTRAVENOUS | Status: DC | PRN

## 2012-01-24 MED ORDER — TETANUS-DIPHTH-ACELL PERTUSSIS 5-2.5-18.5 LF-MCG/0.5 IM SUSP: 0.5000 mL | Freq: Once | INTRAMUSCULAR | Status: DC

## 2012-01-24 MED ORDER — FENTANYL CITRATE 0.05 MG/ML IJ SOLN
INTRAMUSCULAR | Status: AC
  Filled 2012-01-24: qty 2

## 2012-01-24 MED ORDER — GENTAMICIN SULFATE 40 MG/ML IJ SOLN
160.0000 mg | Freq: Three times a day (TID) | INTRAVENOUS | Status: DC
  Filled 2012-01-24 (×2): qty 4

## 2012-01-24 MED ORDER — ONDANSETRON HCL 4 MG PO TABS: 4.0000 mg | ORAL_TABLET | ORAL | Status: DC | PRN

## 2012-01-24 MED ORDER — ONDANSETRON HCL 4 MG/2ML IJ SOLN: 4.0000 mg | INTRAMUSCULAR | Status: DC | PRN

## 2012-01-24 MED ORDER — LACTATED RINGERS IV SOLN: 500.0000 mL | INTRAVENOUS | Status: DC | PRN

## 2012-01-24 MED ORDER — GENTAMICIN SULFATE 40 MG/ML IJ SOLN
Freq: Three times a day (TID) | INTRAVENOUS | Status: DC
  Administered 2012-01-24 – 2012-01-25 (×2): via INTRAVENOUS
  Filled 2012-01-24 (×3): qty 4

## 2012-01-24 MED ORDER — FENTANYL CITRATE 0.05 MG/ML IJ SOLN
100.0000 ug | Freq: Once | INTRAMUSCULAR | Status: AC
  Administered 2012-01-24: 100 ug via EPIDURAL

## 2012-01-24 MED ORDER — LIDOCAINE HCL (PF) 1 % IJ SOLN: 30.0000 mL | INTRAMUSCULAR | Status: DC | PRN

## 2012-01-24 MED ORDER — LANOLIN HYDROUS EX OINT: TOPICAL_OINTMENT | CUTANEOUS | Status: DC | PRN

## 2012-01-24 MED ORDER — LACTATED RINGERS IV SOLN
INTRAVENOUS | Status: DC
  Administered 2012-01-24: 09:00:00 via INTRAVENOUS

## 2012-01-24 MED ORDER — DIBUCAINE 1 % RE OINT: 1.0000 "application " | TOPICAL_OINTMENT | RECTAL | Status: DC | PRN

## 2012-01-24 MED ORDER — PHENYLEPHRINE 40 MCG/ML (10ML) SYRINGE FOR IV PUSH (FOR BLOOD PRESSURE SUPPORT)
80.0000 ug | PREFILLED_SYRINGE | INTRAVENOUS | Status: DC | PRN
  Filled 2012-01-24: qty 5

## 2012-01-24 MED ORDER — OXYTOCIN BOLUS FROM INFUSION: 500.0000 mL | INTRAVENOUS | Status: DC

## 2012-01-24 MED ORDER — FENTANYL 2.5 MCG/ML BUPIVACAINE 1/10 % EPIDURAL INFUSION (WH - ANES)
14.0000 mL/h | INTRAMUSCULAR | Status: DC
  Administered 2012-01-24: 14 mL/h via EPIDURAL
  Filled 2012-01-24 (×2): qty 125

## 2012-01-24 MED ORDER — WITCH HAZEL-GLYCERIN EX PADS: 1.0000 "application " | MEDICATED_PAD | CUTANEOUS | Status: DC | PRN

## 2012-01-24 MED ORDER — CITRIC ACID-SODIUM CITRATE 334-500 MG/5ML PO SOLN: 30.0000 mL | ORAL | Status: DC | PRN

## 2012-01-24 MED ORDER — OXYCODONE-ACETAMINOPHEN 5-325 MG PO TABS
1.0000 | ORAL_TABLET | ORAL | Status: DC | PRN
  Administered 2012-01-24 – 2012-01-25 (×4): 1 via ORAL
  Filled 2012-01-24: qty 1
  Filled 2012-01-24: qty 2
  Filled 2012-01-24 (×2): qty 1

## 2012-01-24 MED ORDER — OXYTOCIN 40 UNITS IN LACTATED RINGERS INFUSION - SIMPLE MED: 62.5000 mL/h | INTRAVENOUS | Status: DC

## 2012-01-24 MED ORDER — SIMETHICONE 80 MG PO CHEW: 80.0000 mg | CHEWABLE_TABLET | ORAL | Status: DC | PRN

## 2012-01-24 MED ORDER — LACTATED RINGERS IV SOLN
500.0000 mL | Freq: Once | INTRAVENOUS | Status: AC
  Administered 2012-01-24: 500 mL via INTRAVENOUS

## 2012-01-24 MED ORDER — OXYCODONE-ACETAMINOPHEN 5-325 MG PO TABS
1.0000 | ORAL_TABLET | ORAL | Status: DC | PRN
  Administered 2012-01-24: 2 via ORAL
  Filled 2012-01-24: qty 2

## 2012-01-24 NOTE — Anesthesia Postprocedure Evaluation (Signed)
  Anesthesia Post-op Note  Patient: Alicia [name]  Procedure(s) Performed: * No procedures listed *  Patient Location: Women's Unit  Anesthesia Type:Epidural  Level of Consciousness: awake  Airway and Oxygen Therapy: Patient Spontanous Breathing  Post-op Pain: mild  Post-op Assessment: Patient's Cardiovascular Status Stable and Respiratory Function Stable  Post-op Vital Signs: stable  Complications: No apparent anesthesia complications

## 2012-01-24 NOTE — Anesthesia Preprocedure Evaluation (Signed)
Anesthesia Evaluation  Patient identified by MRN, date of birth, ID band Patient awake    Reviewed: Allergy & Precautions, H&P , NPO status , Patient's Chart, lab work & pertinent test results  Airway Mallampati: I TM Distance: >3 FB Neck ROM: full    Dental No notable dental hx.    Pulmonary neg pulmonary ROS,    Pulmonary exam normal       Cardiovascular negative cardio ROS      Neuro/Psych negative neurological ROS  negative psych ROS   GI/Hepatic negative GI ROS, Neg liver ROS,   Endo/Other  negative endocrine ROS  Renal/GU negative Renal ROS  negative genitourinary   Musculoskeletal negative musculoskeletal ROS (+)   Abdominal Normal abdominal exam  (+)   Peds negative pediatric ROS (+)  Hematology negative hematology ROS (+)   Anesthesia Other Findings   Reproductive/Obstetrics (+) Pregnancy                           Anesthesia Physical Anesthesia Plan  ASA: II  Anesthesia Plan: Epidural   Post-op Pain Management:    Induction:   Airway Management Planned:   Additional Equipment:   Intra-op Plan:   Post-operative Plan:   Informed Consent: I have reviewed the patients History and Physical, chart, labs and discussed the procedure including the risks, benefits and alternatives for the proposed anesthesia with the patient or authorized representative who has indicated his/her understanding and acceptance.     Plan Discussed with:   Anesthesia Plan Comments:         Anesthesia Quick Evaluation  

## 2012-01-24 NOTE — Progress Notes (Signed)
Alicia [name] is a 41 y.o. [id number] at [redacted]w[redacted]d admitted for PPROM at 33 weeks.  Subjective: Pt comfortable with epidural.  Having some pain between shoulder blades.  Objective: BP 121/63  Pulse 115  Temp 99 F (37.2 C) (Oral)  Resp 18  Ht 5\' 8"  (1.727 m)  Wt 175 lb 12.8 oz (79.742 kg)  BMI 26.73 kg/m2  SpO2 99%      FHT:  FHR: 145 bpm, variability: moderate,  accelerations:  Present,  decelerations:  Present occasional variable UC:   regular, every 4 minutes; MVUs are 150 SVE:   Dilation: 5.5 Effacement (%): 50 Station: -3 Exam by:: Alicia [name]  Labs: Lab Results  Component Value Date   WBC 9.0 01/23/2012   HGB 9.1* 01/23/2012   HCT 29.0* 01/23/2012   MCV 81.0 01/23/2012   PLT 237 01/23/2012    Assessment / Plan: Induction of labor due to chorioamnioitis,  progressing well on pitocin  Labor: slow progression, not having adequate contraction yet. Preeclampsia:  no signs or symptoms of toxicity Fetal Wellbeing:  Category I Pain Control:  Epidural I/D:  temp 99.  On gent and clinda Anticipated MOD:  NSVD  Alicia [name] H. 01/24/2012, 10:00 AM

## 2012-01-25 LAB — CBC
MCH: 25.6 pg — ABNORMAL LOW (ref 26.0–34.0)
MCV: 79.5 fL (ref 78.0–100.0)
Platelets: 175 10*3/uL (ref 150–400)
RDW: 16.6 % — ABNORMAL HIGH (ref 11.5–15.5)

## 2012-01-25 LAB — BASIC METABOLIC PANEL
BUN: 7 mg/dL (ref 6–23)
CO2: 26 mEq/L (ref 19–32)
Calcium: 9.2 mg/dL (ref 8.4–10.5)
Creatinine, Ser: 0.88 mg/dL (ref 0.50–1.10)
GFR calc Af Amer: >90 mL/min (ref >90)

## 2012-01-25 MED ORDER — OXYCODONE-ACETAMINOPHEN 5-325 MG PO TABS: 1.0000 | ORAL_TABLET | ORAL | Status: DC | PRN

## 2012-01-25 MED ORDER — IBUPROFEN 600 MG PO TABS: 600.0000 mg | ORAL_TABLET | Freq: Four times a day (QID) | ORAL | Status: DC

## 2012-01-25 NOTE — Discharge Summary (Signed)
Obstetric Discharge Summary Reason for Admission: rupture of membranes Prenatal Procedures: ultrasound Intrapartum Procedures: spontaneous vaginal delivery Postpartum Procedures: manual placenta extraction Complications-Operative and Postpartum: as above Hemoglobin  Date Value Range Status  01/25/2012 10.0* 12.0 - 15.0 g/dL Final  40/98/1191 47.8   Final     HCT  Date Value Range Status  01/25/2012 31.1* 36.0 - 46.0 % Final    Physical Exam:  General: alert, cooperative and no distress Lochia: appropriate Uterine Fundus: firm Incision: na DVT Evaluation: No evidence of DVT seen on physical exam.  Discharge Diagnoses: preterm delivery  Discharge Information: Date: 01/25/2012 Activity: pelvic rest Diet: routine Medications: Ibuprofen and Percocet Condition: stable Instructions: refer to practice specific booklet Discharge to: home Follow-up Information    Follow up with Physician at [facility]. In 6 weeks.         Newborn Data: Live born female  Birth Weight: 5 lb 5 oz (2410 g) APGAR: 7, 8  Home with surrogate delivery.  EURE,LUTHER H 01/25/2012, 9:16 AM

## 2012-01-25 NOTE — Plan of Care (Signed)
Problem: Phase I Progression Outcomes Goal: Initial discharge plan identified Outcome: Completed/Met Date Met:  01/25/12 Pain control understands Self care F/u care

## 2012-01-25 NOTE — Progress Notes (Signed)
Discharge instructions reviewed with patient.  Patient states understanding of home care and signs/symptoms to report to MD.  No home equipment needed.  Ambulated to car with staff without incident and discharged home with husband.

## 2012-01-27 LAB — TYPE AND SCREEN
ABO/RH(D): A POS
Antibody Screen: NEGATIVE
Unit division: 0
Unit division: 0

## 2012-08-18 LAB — HM PAP SMEAR: HM PAP: NORMAL

## 2012-11-06 ENCOUNTER — Ambulatory Visit (INDEPENDENT_AMBULATORY_CARE_PROVIDER_SITE_OTHER): Payer: BC Managed Care – PPO | Admitting: Internal Medicine

## 2012-11-06 ENCOUNTER — Encounter: Payer: Self-pay | Admitting: Internal Medicine

## 2012-11-06 VITALS — BP 112/72 | HR 99 | Ht 69.0 in | Wt 170.2 lb

## 2012-11-06 DIAGNOSIS — R002 Palpitations: Secondary | ICD-10-CM

## 2012-11-06 DIAGNOSIS — R079 Chest pain, unspecified: Secondary | ICD-10-CM

## 2012-11-06 DIAGNOSIS — R0600 Dyspnea, unspecified: Secondary | ICD-10-CM

## 2012-11-06 DIAGNOSIS — R0609 Other forms of dyspnea: Secondary | ICD-10-CM

## 2012-11-06 NOTE — Progress Notes (Signed)
OFFICE NOTE  Chief Complaint:  Chest pain, epigastric pain, dyspnea on exertion  Primary Care Physician: Tommy Rainwater, MD  HPI:  Alicia [name] is a pleasant 42 year old female with a history of 3 pregnancies, the second was 20 and the superior chest there is a history of anxiety, insomnia, questionable IBS and restless leg syndrome, GERD and hyperlipidemia not on medication. She is concerned about symptoms of chest pressure and associated shortness of breath with exertion. She reports her shortness of breath is significant enough that she avoids going up stairs in her house. She also reports some substernal chest heaviness as well as some upper mid epigastric pain. In 2007 she had similar symptoms and underwent an abdominal ultrasound. I looked for results of that was only able to find an ultrasound in 2005 which was essentially normal. At that time however, she reported that she was told that her artery and vein "twisted or wrapped around each other". The organs are located on the right side to the abdominal cavity therefore I think sinus inversus is unlikely. Interestingly, there is a history of congenital heart disease in her father. She also reported significant difficulty with each pregnancy including marked shortness of breath and tachycardia. In 2011 she underwent an echocardiogram which was reportedly normal. She is here for evaluation of possible congenital heart disease.  PMHx:  Past Medical History  Diagnosis Date  . Hyperlipidemia   . IBS (irritable bowel syndrome)   . GERD (gastroesophageal reflux disease)     Past Surgical History  Procedure Laterality Date  . Cesarean section  2011  . Knee surgery  2006  . Hernia repair  1999  . Dilation and curettage of uterus  2003, 2005, 2008    FAMHx:  Family History  Problem Relation Age of Onset  . Hypertension Mother   . Hyperlipidemia Mother   . Heart Problems Father     hole in heart  . Prostate cancer Maternal  Grandfather     SOCHx:   reports that she has never smoked. She has never used smokeless tobacco. She reports that she does not drink alcohol or use illicit drugs.  ALLERGIES:  Allergies  Allergen Reactions  . Penicillins Hives and Swelling    ROS: A comprehensive review of systems was negative except for: Constitutional: positive for fatigue Cardiovascular: positive for dyspnea Gastrointestinal: positive for abdominal pain and reflux symptoms Behavioral/Psych: positive for anxiety  HOME MEDS: Current Outpatient Prescriptions  Medication Sig Dispense Refill  . ALPRAZolam (XANAX) 0.5 MG tablet Take 0.5 mg by mouth as needed.      . cyclobenzaprine (FLEXERIL) 10 MG tablet Take 10 mg by mouth daily.      . fenofibrate micronized (ANTARA) 130 MG capsule Take 130 mg by mouth daily before breakfast.      . ibuprofen (ADVIL,MOTRIN) 600 MG tablet Take 1 tablet (600 mg total) by mouth every 6 (six) hours.  30 tablet  0  . KARIVA 0.15-0.02/0.01 MG (21/5) tablet daily.      . Multiple Vitamins-Minerals (CENTRUM PO) Take by mouth daily.      Marland Kitchen PARoxetine (PAXIL) 10 MG tablet Take 10 mg by mouth daily.      Marland Kitchen zolpidem (AMBIEN) 10 MG tablet Take 1 tablet by mouth at bedtime as needed.       No current facility-administered medications for this visit.    LABS/IMAGING: No results found for this or any previous visit (from the past 48 hour(s)). No results found.  VITALS:  BP 112/72  Pulse 99  Ht 5\' 9"  (1.753 m)  Wt 170 lb 3.2 oz (77.202 kg)  BMI 25.12 kg/m2  EXAM: General appearance: alert and no distress Neck: no adenopathy, no carotid bruit, no JVD, supple, symmetrical, trachea midline and thyroid not enlarged, symmetric, no tenderness/mass/nodules Lungs: clear to auscultation bilaterally Heart: regular rate and rhythm, S1, S2 normal, no murmur, click, rub or gallop Abdomen: soft, non-tender; bowel sounds normal; no masses,  no organomegaly Extremities: extremities normal,  atraumatic, no cyanosis or edema Pulses: 2+ and symmetric Skin: Skin color, texture, turgor normal. No rashes or lesions Neurologic: Grossly normal  EKG: Sinus rhythm at 99, fusion beat  ASSESSMENT: 1. Progressive dyspnea on exertion 2. Chest and upper abdominal pain 3. Anxiety  PLAN: 1.   Mrs. Alicia [name] is describing increasing shortness of breath which is lifestyle limiting. Alicia [name] describes intermittent chest heaviness and a separate upper mid epigastric pain. These are not necessarily associated with exertion, relieved by rest or after a meal. She is concerned given her father's history of congenital heart disease that there may have been some congenital heart defect that is missed. She has very few risk factors for acquired coronary artery disease. She was on hormone therapy to help with pregnancies given her advanced gestational age. This could be a risk factor for heart disease. I do not appreciate any murmurs on exam today. However given her dyspnea, would like to have her undergo an echocardiogram with bubble study as well as a cardiometabolic stress test. A plan to see her back to discuss results of these studies when they're completed.  Chrystie Nose, MD, [facility] Attending Cardiologist [facility]  HILTY,Kenneth C 11/06/2012, 1:26 PM

## 2012-11-06 NOTE — Patient Instructions (Addendum)
Your physician has requested that you have an echocardiogram. Echocardiography is a painless test that uses sound waves to create images of your heart. It provides your doctor with information about the size and shape of your heart and how well your heart's chambers and valves are working. This procedure takes approximately one hour. There are no restrictions for this procedure.  Dr. Rennis Golden has ordered a metabolic test that also looks at your lung function.   The Cardiopulmonary Exercise Test is a highly sensitive, non-invasive stress test. It is considered a stress test because the exercise stresses your body's systems by making them work faster and harder. A disease or condition that affects the heart, lungs or muscles will limit how much faster and harder these systems can work. A CPET assesses how well the heart, lungs, and muscles are working individually, and how these systems are working in unison. Your heart and lungs work together to deliver oxygen to your muscles, where it is used to make energy, and to remove carbon dioxide from your body.  The full cardiopulmonary system is assessed during a CPET by measuring the amount of oxygen your body is using, the amount of carbon dioxide it is producing, your breathing pattern, and electrocardiogram (EKG) while you are riding a stationary bicycle.  The traditional treadmill stress test only relies on the EKG, which only partially assesses the heart and nothing else. Besides detecting problems in multiple body systems, the CPET is also used to monitor changes in your disease condition, the effect of certain medications on your body, and if medical therapy is improving your condition  A Pulmonary Function Test is a series of five different tests that measure the different volumes within your lungs, how easily you can move air in and out of your lungs, and how well your lungs are able to transfer oxygen into the rest of your body. The results of these tests  can help your doctor assess how your lungs are functioning at rest. If there is a problem, the results will help your doctor determine what is wrong and the appropriate course of treatment.   We will schedule a follow up visit with Dr. Rennis Golden after he has reviewed the results of these tests.

## 2012-11-13 ENCOUNTER — Ambulatory Visit: Payer: Self-pay | Admitting: Obstetrics & Gynecology

## 2012-11-16 ENCOUNTER — Encounter (INDEPENDENT_AMBULATORY_CARE_PROVIDER_SITE_OTHER): Payer: BC Managed Care – PPO

## 2012-11-16 DIAGNOSIS — R0602 Shortness of breath: Secondary | ICD-10-CM

## 2012-11-17 ENCOUNTER — Encounter: Payer: Self-pay | Admitting: Internal Medicine

## 2012-11-18 ENCOUNTER — Ambulatory Visit (HOSPITAL_COMMUNITY)
Admission: RE | Admit: 2012-11-18 | Discharge: 2012-11-18 | Disposition: A | Discharge status: Home / Self Care | Payer: BC Managed Care – PPO | Source: Ambulatory Visit | Attending: Cardiology | Admitting: Cardiology

## 2012-11-18 DIAGNOSIS — R0609 Other forms of dyspnea: Secondary | ICD-10-CM | POA: Insufficient documentation

## 2012-11-18 DIAGNOSIS — R0989 Other specified symptoms and signs involving the circulatory and respiratory systems: Secondary | ICD-10-CM | POA: Insufficient documentation

## 2012-11-18 DIAGNOSIS — R0600 Dyspnea, unspecified: Secondary | ICD-10-CM

## 2012-11-18 DIAGNOSIS — R079 Chest pain, unspecified: Secondary | ICD-10-CM

## 2012-11-18 DIAGNOSIS — R002 Palpitations: Secondary | ICD-10-CM | POA: Insufficient documentation

## 2012-11-18 NOTE — Progress Notes (Signed)
2D Echo Performed 11/18/2012    Rafeal Skibicki, RCS  

## 2012-11-25 ENCOUNTER — Encounter: Payer: Self-pay | Admitting: Internal Medicine

## 2012-11-25 ENCOUNTER — Ambulatory Visit (INDEPENDENT_AMBULATORY_CARE_PROVIDER_SITE_OTHER): Payer: BC Managed Care – PPO | Admitting: Internal Medicine

## 2012-11-25 VITALS — BP 112/68 | HR 84 | Ht 68.0 in | Wt 168.8 lb

## 2012-11-25 DIAGNOSIS — R0600 Dyspnea, unspecified: Secondary | ICD-10-CM

## 2012-11-25 DIAGNOSIS — R0609 Other forms of dyspnea: Secondary | ICD-10-CM

## 2012-11-25 DIAGNOSIS — R079 Chest pain, unspecified: Secondary | ICD-10-CM

## 2012-11-25 DIAGNOSIS — R000 Tachycardia, unspecified: Secondary | ICD-10-CM

## 2012-11-25 MED ORDER — METOPROLOL SUCCINATE ER 25 MG PO TB24: 12.5000 mg | ORAL_TABLET | Freq: Every day | ORAL | Status: DC

## 2012-11-25 NOTE — Progress Notes (Signed)
OFFICE NOTE  Chief Complaint:  Chest pain, epigastric pain, dyspnea on exertion  Primary Care Physician: Tommy Rainwater, MD  HPI:  Alicia [name] is a pleasant 42 year old female with a history of 3 pregnancies, the second was 20 and the superior chest there is a history of anxiety, insomnia, questionable IBS and restless leg syndrome, GERD and hyperlipidemia not on medication. She is concerned about symptoms of chest pressure and associated shortness of breath with exertion. She reports her shortness of breath is significant enough that she avoids going up stairs in her house. She also reports some substernal chest heaviness as well as some upper mid epigastric pain. In 2007 she had similar symptoms and underwent an abdominal ultrasound. I looked for results of that was only able to find an ultrasound in 2005 which was essentially normal. At that time however, she reported that she was told that her artery and vein "twisted or wrapped around each other". The organs are located on the right side to the abdominal cavity therefore I think sinus inversus is unlikely. Interestingly, there is a history of congenital heart disease in her father. She also reported significant difficulty with each pregnancy including marked shortness of breath and tachycardia. In 2011 she underwent an echocardiogram which was reportedly normal. She is here for evaluation of possible congenital heart disease.  Ms. Gaylyn Cheers underwent an echocardiogram and a CPET with PFTs. Her echocardiogram was performed on 11/18/2012. This demonstrated normal systolic and diastolic function with normal chamber sizes and no valvular abnormalities. She also underwent CPET with full PFTs.  Her PFTs were normal including normal FEV1, FEV1/BC, VC, TLC, and DLCO.  With regards to metabolic testing, she exercised to an RER of 1.08, peak the VO2 was 67%, peak heart rate was 90%. He heart rate in the VO2 curves showed no specific ischemic  response. However there was a sharp increase in heart rate throughout exercise without a clear change in slope at the anaerobic threshold. EKG demonstrated PVCs with exercise she experienced lightheadedness at peak exercise.  This is consistent with the symptoms that she feels when she is walking up a long flight of stairs or doing heavy exertion. Those episodes are associated with some dizziness and/or lightheadedness and a feeling of possibly passing out.  PMHx:  Past Medical History  Diagnosis Date  . Hyperlipidemia   . IBS (irritable bowel syndrome)   . GERD (gastroesophageal reflux disease)     Past Surgical History  Procedure Laterality Date  . Cesarean section  2011  . Knee surgery  2006  . Hernia repair  1999  . Dilation and curettage of uterus  2003, 2005, 2008    FAMHx:  Family History  Problem Relation Age of Onset  . Hypertension Mother   . Hyperlipidemia Mother   . Heart Problems Father     hole in heart  . Prostate cancer Maternal Grandfather     SOCHx:   reports that she has never smoked. She has never used smokeless tobacco. She reports that she does not drink alcohol or use illicit drugs.  ALLERGIES:  Allergies  Allergen Reactions  . Penicillins Hives and Swelling    ROS: A comprehensive review of systems was negative except for: Constitutional: positive for fatigue Cardiovascular: positive for dyspnea Gastrointestinal: positive for abdominal pain and reflux symptoms Behavioral/Psych: positive for anxiety  HOME MEDS: Current Outpatient Prescriptions  Medication Sig Dispense Refill  . ALPRAZolam (XANAX) 0.5 MG tablet Take 0.5 mg by mouth as  needed.      . cyclobenzaprine (FLEXERIL) 10 MG tablet Take 10 mg by mouth daily.      . fenofibrate micronized (ANTARA) 130 MG capsule Take 130 mg by mouth daily before breakfast.      . ibuprofen (ADVIL,MOTRIN) 600 MG tablet Take 1 tablet (600 mg total) by mouth every 6 (six) hours.  30 tablet  0  . KARIVA  0.15-0.02/0.01 MG (21/5) tablet daily.      . metoprolol succinate (TOPROL XL) 25 MG 24 hr tablet Take 0.5 tablets (12.5 mg total) by mouth daily.  45 tablet  11  . Multiple Vitamins-Minerals (CENTRUM PO) Take by mouth daily.      Marland Kitchen PARoxetine (PAXIL) 10 MG tablet Take 10 mg by mouth daily.      Marland Kitchen zolpidem (AMBIEN) 10 MG tablet Take 1 tablet by mouth at bedtime as needed.       No current facility-administered medications for this visit.    LABS/IMAGING: No results found for this or any previous visit (from the past 48 hour(s)). No results found.  VITALS: BP 112/68  Pulse 84  Ht 5\' 8"  (1.727 m)  Wt 168 lb 12.8 oz (76.567 kg)  BMI 25.67 kg/m2  EXAM: deferred  EKG: deferred  ASSESSMENT: 1. Progressive dyspnea on exertion - low risk cardiometabolic testing and normal echocardiogram 2. Chest and upper abdominal pain - atypical, sharp, unlikely angina due to low risk stress test 3. Anxiety  PLAN: 1.   Mrs. Alicia [name] has a low risk met test, but she did have a low PO2. Her heart rate was quite high with exercise and there was a significant gap between her heart rate and VO2 curves, mostly due to elevated heart rate. She did have PVCs noted with exercise and did become somewhat dizzy at the end of her exercise. She continues to have sharp, quick "electric" chest pains which come and go. These did not seem to be angina and are reassuring due to a low risk stress test. I think she can work on increasing her VO2 was some exercise and better conditioning as well as mild weight loss. In the short term, I think she would benefit from a beta blocker to lower heart rate, increased diastolic filling time and suppress some of her PVCs. Given her low normal blood pressure, I would like to start at a low dose to see if she tolerates it. I will prescribe Toprol-XL 12.5 mg daily. Plan to see her back in one month to see she's had any improvement in her symptoms.  Chrystie Nose, MD, [facility] Attending  Cardiologist [facility]  HILTY,Kenneth C 11/25/2012, 6:03 PM

## 2012-11-25 NOTE — Patient Instructions (Signed)
Your physician has recommended you make the following change in your medication: START taking metoprolol succinate 12.5 mg once daily (1/2 of a 25mg  tablet)  Your physician recommends that you schedule a follow-up appointment in: 1 month.

## 2013-01-07 LAB — HM COLONOSCOPY

## 2013-03-21 HISTORY — PX: ABDOMINOPLASTY: SHX5355

## 2013-08-30 LAB — HEMOGLOBIN A1C: Hgb A1c MFr Bld: 5.7 % (ref 4.0–6.0)

## 2013-08-30 LAB — LIPID PANEL
Cholesterol: 206 mg/dL — AB (ref 0–200)
HDL: 40 mg/dL (ref 35–70)
LDL Cholesterol: 114 mg/dL
Triglycerides: 261 mg/dL — AB (ref 40–160)

## 2013-09-01 DIAGNOSIS — R7989 Other specified abnormal findings of blood chemistry: Secondary | ICD-10-CM | POA: Insufficient documentation

## 2013-09-01 DIAGNOSIS — E229 Hyperfunction of pituitary gland, unspecified: Secondary | ICD-10-CM

## 2013-09-21 ENCOUNTER — Ambulatory Visit: Payer: Self-pay | Admitting: Family Medicine

## 2013-12-20 ENCOUNTER — Encounter: Payer: Self-pay | Admitting: Internal Medicine

## 2014-01-18 HISTORY — PX: REDUCTION MAMMAPLASTY: SUR839

## 2014-06-28 NOTE — H&P (Signed)
L&D Evaluation:  History:   HPI 95MW [id number] at 8121w6d by D=1st trimester U/S who is a surrogate carrier for a couple in [place] and conceived via IVF and embryo transfer, presents with c/o LOF tonight.  No contractions or bleeding.  Good FM.  [facility], AMA (biological mom 44yo) harmony testing negative, prior C/S x 1 preceeded by 7 vaginal deliveries, plans VBAC for this pregnancy.  Biological family history of heart defect, but fetal echo negative @ 22wks.   A+, GBS unknown.    Presents with leaking fluid    Patient's Medical History No Chronic Illness  AMA    Patient's Surgical History Previous C-Section    Medications Pre Natal Vitamins    Allergies PCN    Social History none    Family History Non-Contributory   ROS:   ROS All systems were reviewed.  HEENT, CNS, GI, GU, Respiratory, CV, Renal and Musculoskeletal systems were found to be normal.   Exam:   Vital Signs stable    General no apparent distress    Mental Status clear    Chest normal effort    Abdomen gravid, non-tender    Estimated Fetal Weight Average for gestational age    Pelvic no external lesions, cervix not examined    Mebranes Ruptured, gross rupture of membranes, + nitrazine    Description clear    FHT normal rate with no decels    Ucx absent    Skin dry   Impression:   Impression Grand multip at 7721w6d with PPROM   Plan:   Comments - BMZ x 2 - latentcy antibiotics clinda and gent IV x 2d then clinda PO x 5d (due to pcn allergy) - monitor on L&D x 24hrs for fetal surveillance and contractions, if remains reassuring will then plan to transfer to floor for continued inpatient management - daily monitoring for signs of labor and/or chorio - will plan for delivery at 34wks, unless labor, chorio or non-reassuring surveillance - pt aware of full SCN situation, desires to [facility] and if needed will transfer baby after delivery/stabilization - Dr. Tiburcio PeaHarris aware per pt  request   Electronic Signatures: Garnette GunnerStansbury Clipp, Ali LoweEryn K (MD)  (Signed [phone]-Nov-13 22:47)  Authored: L&D Evaluation   Last Updated: 29-Nov-13 22:47 by Garnette GunnerStansbury Clipp, Ali LoweEryn K (MD)

## 2014-08-14 ENCOUNTER — Encounter: Payer: Self-pay | Admitting: Family Medicine

## 2014-08-14 DIAGNOSIS — G2581 Restless legs syndrome: Secondary | ICD-10-CM | POA: Insufficient documentation

## 2014-08-14 DIAGNOSIS — E559 Vitamin D deficiency, unspecified: Secondary | ICD-10-CM | POA: Insufficient documentation

## 2014-08-14 DIAGNOSIS — K219 Gastro-esophageal reflux disease without esophagitis: Secondary | ICD-10-CM | POA: Insufficient documentation

## 2014-08-14 DIAGNOSIS — E663 Overweight: Secondary | ICD-10-CM | POA: Insufficient documentation

## 2014-08-14 DIAGNOSIS — M5136 Other intervertebral disc degeneration, lumbar region: Secondary | ICD-10-CM | POA: Insufficient documentation

## 2014-08-14 DIAGNOSIS — M2669 Other specified disorders of temporomandibular joint: Secondary | ICD-10-CM | POA: Insufficient documentation

## 2014-08-14 DIAGNOSIS — R739 Hyperglycemia, unspecified: Secondary | ICD-10-CM | POA: Insufficient documentation

## 2014-08-14 DIAGNOSIS — K648 Other hemorrhoids: Secondary | ICD-10-CM | POA: Insufficient documentation

## 2014-08-14 DIAGNOSIS — K581 Irritable bowel syndrome with constipation: Secondary | ICD-10-CM | POA: Insufficient documentation

## 2014-08-14 DIAGNOSIS — H9319 Tinnitus, unspecified ear: Secondary | ICD-10-CM | POA: Insufficient documentation

## 2014-08-14 DIAGNOSIS — E781 Pure hyperglyceridemia: Secondary | ICD-10-CM | POA: Insufficient documentation

## 2014-08-14 DIAGNOSIS — G4700 Insomnia, unspecified: Secondary | ICD-10-CM | POA: Insufficient documentation

## 2014-08-14 DIAGNOSIS — F329 Major depressive disorder, single episode, unspecified: Secondary | ICD-10-CM | POA: Insufficient documentation

## 2014-08-15 ENCOUNTER — Encounter: Payer: Self-pay | Admitting: Family Medicine

## 2014-08-15 ENCOUNTER — Encounter (INDEPENDENT_AMBULATORY_CARE_PROVIDER_SITE_OTHER): Payer: Self-pay

## 2014-08-15 ENCOUNTER — Ambulatory Visit (INDEPENDENT_AMBULATORY_CARE_PROVIDER_SITE_OTHER): Payer: BLUE CROSS/BLUE SHIELD | Admitting: Family Medicine

## 2014-08-15 VITALS — BP 116/58 | HR 118 | Temp 98.9°F | Resp 16 | Ht 68.0 in | Wt 180.2 lb

## 2014-08-15 DIAGNOSIS — E229 Hyperfunction of pituitary gland, unspecified: Secondary | ICD-10-CM

## 2014-08-15 DIAGNOSIS — Z79899 Other long term (current) drug therapy: Secondary | ICD-10-CM | POA: Diagnosis not present

## 2014-08-15 DIAGNOSIS — G8929 Other chronic pain: Secondary | ICD-10-CM | POA: Diagnosis not present

## 2014-08-15 DIAGNOSIS — M542 Cervicalgia: Secondary | ICD-10-CM | POA: Diagnosis not present

## 2014-08-15 DIAGNOSIS — K219 Gastro-esophageal reflux disease without esophagitis: Secondary | ICD-10-CM | POA: Diagnosis not present

## 2014-08-15 DIAGNOSIS — F418 Other specified anxiety disorders: Secondary | ICD-10-CM | POA: Diagnosis not present

## 2014-08-15 DIAGNOSIS — K581 Irritable bowel syndrome with constipation: Secondary | ICD-10-CM

## 2014-08-15 DIAGNOSIS — G4700 Insomnia, unspecified: Secondary | ICD-10-CM

## 2014-08-15 DIAGNOSIS — E781 Pure hyperglyceridemia: Secondary | ICD-10-CM

## 2014-08-15 DIAGNOSIS — K589 Irritable bowel syndrome without diarrhea: Secondary | ICD-10-CM | POA: Diagnosis not present

## 2014-08-15 DIAGNOSIS — R000 Tachycardia, unspecified: Secondary | ICD-10-CM

## 2014-08-15 DIAGNOSIS — E559 Vitamin D deficiency, unspecified: Secondary | ICD-10-CM

## 2014-08-15 DIAGNOSIS — F329 Major depressive disorder, single episode, unspecified: Secondary | ICD-10-CM

## 2014-08-15 DIAGNOSIS — F419 Anxiety disorder, unspecified: Secondary | ICD-10-CM

## 2014-08-15 DIAGNOSIS — F32A Depression, unspecified: Secondary | ICD-10-CM

## 2014-08-15 DIAGNOSIS — R7989 Other specified abnormal findings of blood chemistry: Secondary | ICD-10-CM

## 2014-08-15 MED ORDER — ZOLPIDEM TARTRATE 10 MG PO TABS: 10.0000 mg | ORAL_TABLET | Freq: Every evening | ORAL | Status: DC | PRN

## 2014-08-15 MED ORDER — PAROXETINE HCL 10 MG PO TABS: 10.0000 mg | ORAL_TABLET | Freq: Two times a day (BID) | ORAL | Status: DC

## 2014-08-15 MED ORDER — ALPRAZOLAM 0.5 MG PO TABS: 0.5000 mg | ORAL_TABLET | Freq: Two times a day (BID) | ORAL | Status: DC

## 2014-08-15 MED ORDER — PANTOPRAZOLE SODIUM 40 MG PO TBEC: 40.0000 mg | DELAYED_RELEASE_TABLET | Freq: Every day | ORAL | Status: DC

## 2014-08-15 MED ORDER — CYCLOBENZAPRINE HCL 10 MG PO TABS: 10.0000 mg | ORAL_TABLET | Freq: Every day | ORAL | Status: DC
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
## 2014-08-25 LAB — LIPID PANEL
Cholesterol: 215 mg/dL — AB (ref 0–200)
HDL: 38 mg/dL (ref 35–70)
LDL Cholesterol: 116 mg/dL
Triglycerides: 303 mg/dL — AB (ref 40–160)

## 2014-08-25 LAB — BASIC METABOLIC PANEL
BUN: 18 mg/dL (ref 4–21)
Creatinine: 0.9 mg/dL (ref 0.5–1.1)
Glucose: 120 mg/dL
Sodium: 140 mmol/L (ref 137–147)

## 2014-08-25 LAB — HEMOGLOBIN A1C: Hgb A1c MFr Bld: 6 % (ref 4.0–6.0)

## 2014-08-29 ENCOUNTER — Other Ambulatory Visit: Payer: Self-pay | Admitting: Family Medicine

## 2014-08-29 ENCOUNTER — Encounter: Payer: Self-pay | Admitting: Family Medicine

## 2014-08-29 DIAGNOSIS — D649 Anemia, unspecified: Secondary | ICD-10-CM

## 2014-08-29 DIAGNOSIS — D500 Iron deficiency anemia secondary to blood loss (chronic): Secondary | ICD-10-CM | POA: Insufficient documentation

## 2014-08-29 NOTE — Progress Notes (Signed)
[facility] on [facility] additional bloodwork. Notified patient of blood work results.

## 2014-09-06 ENCOUNTER — Encounter: Payer: Self-pay | Admitting: Family Medicine

## 2014-09-12 ENCOUNTER — Encounter: Payer: Self-pay | Admitting: Family Medicine

## 2014-11-22 ENCOUNTER — Ambulatory Visit (INDEPENDENT_AMBULATORY_CARE_PROVIDER_SITE_OTHER): Payer: BLUE CROSS/BLUE SHIELD | Admitting: Family Medicine

## 2014-11-22 ENCOUNTER — Encounter: Payer: Self-pay | Admitting: Family Medicine

## 2014-11-22 VITALS — BP 106/58 | HR 134 | Temp 98.7°F | Resp 18 | Ht 68.0 in | Wt 183.5 lb

## 2014-11-22 DIAGNOSIS — N920 Excessive and frequent menstruation with regular cycle: Secondary | ICD-10-CM

## 2014-11-22 DIAGNOSIS — Z23 Encounter for immunization: Secondary | ICD-10-CM | POA: Diagnosis not present

## 2014-11-22 DIAGNOSIS — R42 Dizziness and giddiness: Secondary | ICD-10-CM

## 2014-11-22 DIAGNOSIS — D500 Iron deficiency anemia secondary to blood loss (chronic): Secondary | ICD-10-CM | POA: Diagnosis not present

## 2014-11-22 NOTE — Progress Notes (Signed)
Name: Alicia [name]   MRN: [id number]    DOB: [birth year]   Date:11/22/2014       Progress Note  Subjective  Chief Complaint  Chief Complaint  Patient presents with  . Medication Refill    follow-up  . Anemia    low iron reading  . Depression  . Gastrophageal Reflux  . Insomnia    HPI  Iron deficiency anemia: going on for many years, however worse since in 2013 when she had a blood transfusion in 01/2012 while pregnant with surrogate baby.  She still has heavy cycles, lasts 4 days, and symptoms of iron deficiency anemia are severe now. Fatigue, palpitation, near syncope ( vision closes in like she is falling asleep for a brief second - usually when driving and worse the week after her cycle), denies pica, but has irritability and decrease in exercise tolerance. She had a hgb checked at work last week and it was 7.0  GERD: had EGD back in 2013, we will send her back to Dr. Shelle Iron because of severe iron deficiency anemi   Patient Active Problem List   Diagnosis Date Noted  . Menorrhagia with regular cycle 11/22/2014  . Iron deficiency anemia due to chronic blood loss 08/29/2014  . Insomnia, persistent 08/14/2014  . Anxiety and depression 08/14/2014  . Temporomandibular joint sounds on opening and/or closing the jaw 08/14/2014  . Degenerative disc disease, lumbar 08/14/2014  . Bleeding internal hemorrhoids 08/14/2014  . Gastric reflux 08/14/2014  . Blood glucose elevated 08/14/2014  . Irritable bowel syndrome with constipation 08/14/2014  . Hypertriglyceridemia 08/14/2014  . Overweight 08/14/2014  . Restless legs syndrome 08/14/2014  . Tinnitus 08/14/2014  . Vitamin D deficiency 08/14/2014  . Tachycardia 11/25/2012  . DOE (dyspnea on exertion) 11/06/2012  . Previous cesarean delivery, delivered, with or without mention of antepartum condition 01/19/2012    Past Surgical History  Procedure Laterality Date  . Cesarean section  2011  . Knee surgery  2006  . Hernia  repair  1999  . Dilation and curettage of uterus  2003, 2005, 2008    Family History  Problem Relation Age of Onset  . Hypertension Mother   . Hyperlipidemia Mother   . Heart Problems Father     hole in heart  . Prostate cancer Maternal Grandfather     Social History   Social History  . Marital Status: Married    Spouse Name: N/A  . Number of Children: 4  . 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 Smoking status: Never Smoker   . Smokeless tobacco: Never Used  . Alcohol Use: No  . Drug Use: No  . Sexual Activity:    Partners: Male   Other Topics Concern  . Not on file   Social History Narrative     Current outpatient prescriptions:  .  ALPRAZolam (XANAX) 0.5 MG tablet, Take 1 tablet (0.5 mg total) by mouth 2 (two) times daily., Disp: 60 tablet, Rfl: 3 .  cyclobenzaprine (FLEXERIL) 10 MG tablet, Take 1 tablet (10 mg total) by mouth daily., Disp: 30 tablet, Rfl: 5 .  ibuprofen (ADVIL,MOTRIN) 600 MG tablet, Take 1 tablet (600 mg total) by mouth every 6 (six) hours., Disp: 30 tablet, Rfl: 0 .  Multiple Vitamins-Minerals (CENTRUM PO), Take by mouth daily., Disp: , Rfl:  .  pantoprazole (PROTONIX) 40 MG tablet, Take 1 tablet (40 mg total) by mouth daily., Disp: 30  tablet, Rfl: 5 .  PARoxetine (PAXIL) 10 MG tablet, Take 1 tablet (10 mg total) by mouth 2 (two) times daily., Disp: 60 tablet, Rfl: 5 .  polyethylene glycol (MIRALAX / GLYCOLAX) packet, Take 17 g by mouth daily., Disp: , Rfl:  .  zolpidem (AMBIEN) 10 MG tablet, Take 1 tablet (10 mg total) by mouth at bedtime as needed., Disp: 30 tablet, Rfl: 5  Allergies  Allergen Reactions  . Azithromycin Diarrhea  . Penicillins Hives and Swelling     ROS  Constitutional: Negative for fever or weight change.  Respiratory: Negative for cough , mild  shortness of breath.   Cardiovascular: Negative for chest pain , positive for  palpitations.   Gastrointestinal: Negative for abdominal pain, no bowel changes.  Musculoskeletal: Negative for gait problem or joint swelling.  Skin: Negative for rash.  Neurological: positive for dizziness , negative for headache.  No other specific complaints in a complete review of systems (except as listed in HPI above).  Objective  Filed Vitals:   11/22/14 0811  BP: 106/58  Pulse: 134  Temp: 98.7 F (37.1 C)  TempSrc: Oral  Resp: 18  Height:  (1.727 m)  Weight: 183 lb 8 oz (83.235 kg)  SpO2: 97%    Body mass index is 27.91 kg/(m^2).  Physical Exam  Constitutional: Patient appears well-developed and well-nourished. No distress. Not pale HEENT: head atraumatic, normocephalic, pupils equal and reactive to light, neck supple, throat within normal limits Cardiovascular: tachycardia ( TSH ordered in July - she will faxed me results - done at her work )  regular rhythm and normal heart sounds.  No murmur heard. No BLE edema. Normal capillary refill Pulmonary/Chest: Effort normal and breath sounds normal. No respiratory distress. Abdominal: Soft.  There is no tenderness. Psychiatric: Patient has a normal mood and affect. behavior is normal. Judgment and thought content normal.  Recent Results (from the past 2160 hour(s))  Basic metabolic panel     Status: None   Collection Time: 08/25/14 12:00 AM  Result Value Ref Range   Glucose 120 mg/dL  Lipid panel     Status: Abnormal   Collection Time: 08/25/14 12:00 AM  Result Value Ref Range   Triglycerides 303 (A) 40 - 160 mg/dL   Cholesterol 098 (A) 0 - 200 mg/dL   HDL 38 35 - 70 mg/dL   LDL Cholesterol 119 mg/dL  Hemoglobin J4N     Status: None   Collection Time: 08/25/14 12:00 AM  Result Value Ref Range   Hgb A1c MFr Bld 6.0 4.0 - 6.0 %  Basic metabolic panel     Status: None   Collection Time: 08/25/14 12:00 AM  Result Value Ref Range   BUN 18 4 - 21 mg/dL   Creatinine 0.9 0.5 - 1.1 mg/dL   Sodium 829 562 - 130 mmol/L      PHQ2/9: Depression screen [facility] 2/9 11/22/2014 08/15/2014  Decreased Interest 0 0  Down, Depressed, Hopeless 0 0  PHQ - 2 Score 0 0     Fall Risk: Fall Risk  11/22/2014 08/15/2014  Falls in the past year? No Yes  Number falls in past yr: - 1  Injury with Fall? - Yes     Functional Status Survey: Is the patient deaf or have difficulty hearing?: No Does the patient have difficulty seeing, even when wearing glasses/contacts?: Yes (contacts/glasses) Does the patient have difficulty concentrating, remembering, or making decisions?: No Does the patient have difficulty walking or climbing stairs?: No  Does the patient have difficulty dressing or bathing?: No Does the patient have difficulty doing errands alone such as visiting a doctor's office or shopping?: No   Assessment & Plan  1. Menorrhagia with regular cycle  Keep follow up with GYN, endometrial ablation is a good idea  2. Needs flu shot  - Flu Vaccine QUAD 36+ mos PF IM (Fluarix & Fluzone Quad PF)  3. Iron deficiency anemia due to chronic blood loss  Unable to take any po supplementation, makes her IBS worse, refer to hematologist for possible iron infusion  - Ambulatory referral to Gastroenterology - Ambulatory referral to Hematology  4. Dizziness  Likely from iron deficiency anemia, advised her not to drive, at least in am's when symptoms are severe. If no resolution after hgb level improves we will refer her to neuro.

## 2014-11-30 ENCOUNTER — Inpatient Hospital Stay: Payer: BLUE CROSS/BLUE SHIELD | Attending: Oncology | Admitting: Oncology

## 2014-11-30 VITALS — BP 100/65 | HR 98 | Temp 99.1°F | Resp 16 | Wt 186.3 lb

## 2014-11-30 DIAGNOSIS — Z809 Family history of malignant neoplasm, unspecified: Secondary | ICD-10-CM | POA: Diagnosis not present

## 2014-11-30 DIAGNOSIS — K219 Gastro-esophageal reflux disease without esophagitis: Secondary | ICD-10-CM | POA: Diagnosis not present

## 2014-11-30 DIAGNOSIS — Z79899 Other long term (current) drug therapy: Secondary | ICD-10-CM | POA: Diagnosis not present

## 2014-11-30 DIAGNOSIS — R5383 Other fatigue: Secondary | ICD-10-CM | POA: Diagnosis not present

## 2014-11-30 DIAGNOSIS — R531 Weakness: Secondary | ICD-10-CM | POA: Diagnosis not present

## 2014-11-30 DIAGNOSIS — D509 Iron deficiency anemia, unspecified: Secondary | ICD-10-CM | POA: Insufficient documentation

## 2014-11-30 DIAGNOSIS — K589 Irritable bowel syndrome without diarrhea: Secondary | ICD-10-CM | POA: Insufficient documentation

## 2014-11-30 DIAGNOSIS — E785 Hyperlipidemia, unspecified: Secondary | ICD-10-CM | POA: Diagnosis not present

## 2014-11-30 NOTE — Progress Notes (Signed)
Patient has history of anemia and received a blood transfusion in 01/2012 she is not able to tolerate oral iron due to causing stomach pain also constipation.  The symptoms that she has are fatigue, muscle pain at night, and also reports episodes of syncope.

## 2014-12-07 ENCOUNTER — Inpatient Hospital Stay: Payer: BLUE CROSS/BLUE SHIELD

## 2014-12-07 VITALS — BP 100/65 | HR 92 | Temp 97.2°F | Resp 16

## 2014-12-07 DIAGNOSIS — D500 Iron deficiency anemia secondary to blood loss (chronic): Secondary | ICD-10-CM

## 2014-12-07 DIAGNOSIS — D509 Iron deficiency anemia, unspecified: Secondary | ICD-10-CM | POA: Diagnosis not present

## 2014-12-07 MED ORDER — SODIUM CHLORIDE 0.9 % IV SOLN
510.0000 mg | Freq: Once | INTRAVENOUS | Status: AC
  Administered 2014-12-07: 510 mg via INTRAVENOUS
  Filled 2014-12-07: qty 17

## 2014-12-07 MED ORDER — SODIUM CHLORIDE 0.9 % IV SOLN
Freq: Once | INTRAVENOUS | Status: AC
  Administered 2014-12-07: 14:00:00 via INTRAVENOUS
  Filled 2014-12-07: qty 1000

## 2014-12-14 ENCOUNTER — Inpatient Hospital Stay: Payer: BLUE CROSS/BLUE SHIELD

## 2014-12-14 VITALS — BP 127/81 | HR 90 | Temp 98.0°F | Resp 18

## 2014-12-14 DIAGNOSIS — D500 Iron deficiency anemia secondary to blood loss (chronic): Secondary | ICD-10-CM

## 2014-12-14 DIAGNOSIS — D509 Iron deficiency anemia, unspecified: Secondary | ICD-10-CM | POA: Diagnosis not present

## 2014-12-14 MED ORDER — SODIUM CHLORIDE 0.9 % IV SOLN
Freq: Once | INTRAVENOUS | Status: AC
  Administered 2014-12-14: 14:00:00 via INTRAVENOUS
  Filled 2014-12-14: qty 1000

## 2014-12-14 MED ORDER — SODIUM CHLORIDE 0.9 % IV SOLN
510.0000 mg | Freq: Once | INTRAVENOUS | Status: AC
  Administered 2014-12-14: 510 mg via INTRAVENOUS
  Filled 2014-12-14: qty 17

## 2014-12-14 NOTE — Progress Notes (Signed)
[facility]  Telephone:([phone]) [phone] [phone]  ID: Farris Has OB: 16-Mar-1970  MR#: [id number]  QIO#:[id number]  Patient Care Team: Alba Cory, MD as PCP - General (Family Medicine)  CHIEF COMPLAINT:  Chief Complaint  Patient presents with  . New Evaluation  . Anemia    INTERVAL HISTORY: Patient is a 44 year old female who has a long-standing history of iron deficiency anemia requiring a blood transfusion in December 2013. She has been tried on oral iron supplementation but could not tolerate secondary to abdominal pain and constipation. Currently, she feels weak and fatigued but otherwise feels well. She has no neurologic complaints. She denies any recent fevers or illnesses. She has no chest pain or shortness of breath. She denies any nausea, vomiting, constipation, or diarrhea. She has no urinary complaints. Patient offers no further specific complaints today.  REVIEW OF SYSTEMS:   Review of Systems  Constitutional: Positive for malaise/fatigue.  Respiratory: Negative.  Negative for shortness of breath.   Cardiovascular: Negative.  Negative for chest pain.  Gastrointestinal: Negative.  Negative for blood in stool and melena.  Musculoskeletal: Negative.   Neurological: Positive for weakness.    As per HPI. Otherwise, a complete review of systems is negatve.  PAST MEDICAL HISTORY: Past Medical History  Diagnosis Date  . Hyperlipidemia   . IBS (irritable bowel syndrome)   . GERD (gastroesophageal reflux disease)     PAST SURGICAL HISTORY: Past Surgical History  Procedure Laterality Date  . Cesarean section  2011  . Knee surgery  2006  . Hernia repair  1999  . Dilation and curettage of uterus  2003, 2005, 2008    FAMILY HISTORY Family History  Problem Relation Age of Onset  . Hypertension Mother   . Hyperlipidemia Mother   . Heart Problems Father     hole in heart  . Prostate cancer Maternal Grandfather         ADVANCED DIRECTIVES:    HEALTH MAINTENANCE: Social History  Substance Use Topics  . Smoking status: Never Smoker   . Smokeless tobacco: Never Used  . Alcohol Use: No     Colonoscopy:  PAP:  Bone density:  Lipid panel:  Allergies  Allergen Reactions  . Azithromycin Diarrhea  . Penicillins Hives and Swelling    Current Outpatient Prescriptions  Medication Sig Dispense Refill  . ALPRAZolam (XANAX) 0.5 MG tablet Take 1 tablet (0.5 mg total) by mouth 2 (two) times daily. 60 tablet 3  . cyclobenzaprine (FLEXERIL) 10 MG tablet Take 1 tablet (10 mg total) by mouth daily. 30 tablet 5  . ibuprofen (ADVIL,MOTRIN) 600 MG tablet Take 1 tablet (600 mg total) by mouth every 6 (six) hours. 30 tablet 0  . Multiple Vitamins-Minerals (CENTRUM PO) Take by mouth daily.    . pantoprazole (PROTONIX) 40 MG tablet Take 1 tablet (40 mg total) by mouth daily. 30 tablet 5  . PARoxetine (PAXIL) 10 MG tablet Take 1 tablet (10 mg total) by mouth 2 (two) times daily. 60 tablet 5  . polyethylene glycol (MIRALAX / GLYCOLAX) packet Take 17 g by mouth daily.    Marland Kitchen zolpidem (AMBIEN) 10 MG tablet Take 1 tablet (10 mg total) by mouth at bedtime as needed. 30 tablet 5   No current facility-administered medications for this visit.    OBJECTIVE: Filed Vitals:   11/30/14 0854  BP: 100/65  Pulse: 98  Temp: 99.1 F (37.3 C)  Resp: 16     Body mass index is 28.33 kg/(m^2).  ECOG FS:0 - Asymptomatic  General: Well-developed, well-nourished, no acute distress. Eyes: Pink conjunctiva, anicteric sclera. HEENT: Normocephalic, moist mucous membranes, clear oropharnyx. Lungs: Clear to auscultation bilaterally. Heart: Regular rate and rhythm. No rubs, murmurs, or gallops. Abdomen: Soft, nontender, nondistended. No organomegaly noted, normoactive bowel sounds. Musculoskeletal: No edema, cyanosis, or clubbing. Neuro: Alert, answering all questions appropriately. Cranial nerves grossly intact. Skin: No  rashes or petechiae noted. Psych: Normal affect. Lymphatics: No cervical, calvicular, axillary or inguinal LAD.   LAB RESULTS:  Lab Results  Component Value Date   NA 140 08/25/2014   K 4.0 01/25/2012   CL 104 01/25/2012   CO2 26 01/25/2012   GLUCOSE 73 01/25/2012   BUN 18 08/25/2014   CREATININE 0.9 08/25/2014   CALCIUM 9.2 01/25/2012   GFRNONAA 81* 01/25/2012   GFRAA >90 01/25/2012    Lab Results  Component Value Date   WBC 8.9 01/25/2012   NEUTROABS 7.2 01/23/2012   HGB 10.0* 01/25/2012   HCT 31.1* 01/25/2012   MCV 79.5 01/25/2012   PLT 175 01/25/2012     STUDIES: No results found.  ASSESSMENT: Iron deficiency anemia.  PLAN:    1. Iron deficiency anemia: Recent hemoglobin of patient's primary care was found to be decreased. Her iron stores are also decreased with an iron saturation of 7%, total iron of 26, and a ferritin of 14. Patient will definitely benefit from 510 mg IV Feraheme. Return to clinic in 1 and 2 weeks for her infusions. Patient will then return to clinic in 10 weeks for repeat laboratory work and further evaluation. We will do a full anemia workup at that time as well.  Patient expressed understanding and was in agreement with this plan. She also understands that She can call clinic at any time with any questions, concerns, or complaints.    Jeralyn Ruthsimothy J Lenville Hibberd, MD   12/14/2014 4:17 PM

## 2014-12-16 ENCOUNTER — Encounter: Payer: Self-pay | Admitting: Family Medicine

## 2014-12-16 ENCOUNTER — Ambulatory Visit (INDEPENDENT_AMBULATORY_CARE_PROVIDER_SITE_OTHER): Payer: BLUE CROSS/BLUE SHIELD | Admitting: Family Medicine

## 2014-12-16 ENCOUNTER — Ambulatory Visit: Payer: BLUE CROSS/BLUE SHIELD | Admitting: Family Medicine

## 2014-12-16 VITALS — BP 118/68 | HR 105 | Temp 98.1°F | Resp 20 | Ht 68.0 in | Wt 176.9 lb

## 2014-12-16 DIAGNOSIS — F4321 Adjustment disorder with depressed mood: Secondary | ICD-10-CM | POA: Insufficient documentation

## 2014-12-16 DIAGNOSIS — F430 Acute stress reaction: Secondary | ICD-10-CM | POA: Diagnosis not present

## 2014-12-16 MED ORDER — ALPRAZOLAM ER 0.5 MG PO TB24: 0.5000 mg | ORAL_TABLET | Freq: Every day | ORAL | Status: DC

## 2014-12-16 NOTE — Progress Notes (Signed)
Name: Alicia [name]   MRN: [id number]    DOB: Aug 13, 1970   Date:12/16/2014       Progress Note  Subjective  Chief Complaint  Chief Complaint  Patient presents with  . Depression    death of husband, wants to talk    HPI  Acute grieve/stress : patient left before I was able to see her, but I called her on her cell phone. She told me her husband as healthy, but developed acute onset of severe headache and died within 24 hours. He was diagnosed with a brain aneurysm.  She has been taking her antidepressant medication and Alprazolam as prescribed, but is very anxious. Feels like her heart is always pounding in her chest. Feeling very nervous and anxious. Discussed options and we will try Alprazolam XR, advised her to come in to be seen if no improvement. I apologized for not being able to see her. We tried to work her in, however my last patient was a physical that required a procedure.   Patient Active Problem List   Diagnosis Date Noted  . Grieving 12/16/2014  . Acute stress reaction 12/16/2014  . Menorrhagia with regular cycle 11/22/2014  . Iron deficiency anemia due to chronic blood loss 08/29/2014  . Insomnia, persistent 08/14/2014  . Anxiety and depression 08/14/2014  . Temporomandibular joint sounds on opening and/or closing the jaw 08/14/2014  . Degenerative disc disease, lumbar 08/14/2014  . Bleeding internal hemorrhoids 08/14/2014  . Gastric reflux 08/14/2014  . Blood glucose elevated 08/14/2014  . Irritable bowel syndrome with constipation 08/14/2014  . Hypertriglyceridemia 08/14/2014  . Overweight 08/14/2014  . Restless legs syndrome 08/14/2014  . Tinnitus 08/14/2014  . Vitamin D deficiency 08/14/2014  . Tachycardia 11/25/2012  . DOE (dyspnea on exertion) 11/06/2012  . Previous cesarean delivery, delivered, with or without mention of antepartum condition 01/19/2012    Past Surgical History  Procedure Laterality Date  . Cesarean section  2011  . Knee  surgery  2006  . Hernia repair  1999  . Dilation and curettage of uterus  2003, 2005, 2008    Family History  Problem Relation Age of Onset  . Hypertension Mother   . Hyperlipidemia Mother   . Heart Problems Father     hole in heart  . Prostate cancer Maternal Grandfather     Social History   Social History  . Marital Status: Married    Spouse Name: N/A  . Number of Children: 4  . 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 Smoking status: Never Smoker   . Smokeless tobacco: Never Used  . Alcohol Use: No  . Drug Use: No  . Sexual Activity:    Partners: Male   Other Topics Concern  . Not on file   Social History Narrative     Current outpatient prescriptions:  .  ALPRAZolam (XANAX XR) 0.5 MG 24 hr tablet, Take 1 tablet (0.5 mg total) by mouth daily., Disp: 30 tablet, Rfl: 0 .  ALPRAZolam (XANAX) 0.5 MG tablet, Take 1 tablet (0.5 mg total) by mouth 2 (two) times daily., Disp: 60 tablet, Rfl: 3 .  cyclobenzaprine (FLEXERIL) 10 MG tablet, Take 1 tablet (10 mg total) by mouth daily., Disp: 30 tablet, Rfl: 5 .  ibuprofen (ADVIL,MOTRIN) 600 MG tablet, Take 1 tablet (600 mg total) by mouth every 6 (six) hours., Disp: 30 tablet, Rfl: 0 .  Multiple Vitamins-Minerals (CENTRUM PO), Take  by mouth daily., Disp: , Rfl:  .  pantoprazole (PROTONIX) 40 MG tablet, Take 1 tablet (40 mg total) by mouth daily., Disp: 30 tablet, Rfl: 5 .  PARoxetine (PAXIL) 10 MG tablet, Take 1 tablet (10 mg total) by mouth 2 (two) times daily., Disp: 60 tablet, Rfl: 5 .  polyethylene glycol (MIRALAX / GLYCOLAX) packet, Take 17 g by mouth daily., Disp: , Rfl:  .  zolpidem (AMBIEN) 10 MG tablet, Take 1 tablet (10 mg total) by mouth at bedtime as needed., Disp: 30 tablet, Rfl: 5  Allergies  Allergen Reactions  . Azithromycin Diarrhea  . Penicillins Hives and Swelling     ROS  Not done  Objective  Filed Vitals:   12/16/14 1156   BP: 118/68  Pulse: 105  Temp: 98.1 F (36.7 C)  TempSrc: Oral  Resp: 20  Height: 5\' 8"  (1.727 m)  Weight: 176 lb 14.4 oz (80.241 kg)  SpO2: 97%    Body mass index is 26.9 kg/(m^2).  Physical Exam  Not done  PHQ2/9: [facility] 11/22/2014 08/15/2014  Decreased Interest 0 0  Down, Depressed, Hopeless 0 0  PHQ - 2 Score 0 0    Fall Risk: Fall Risk  11/22/2014 08/15/2014  Falls in the past year? No Yes  Number falls in past yr: - 1  Injury with Fall? - Yes        Assessment & Plan  1. Grieving  - ALPRAZolam (XANAX XR) 0.5 MG 24 hr tablet; Take 1 tablet (0.5 mg total) by mouth daily.  Dispense: 30 tablet; Refill: 0  2. Acute stress reaction  - ALPRAZolam (XANAX XR) 0.5 MG 24 hr tablet; Take 1 tablet (0.5 mg total) by mouth daily.  Dispense: 30 tablet; Refill: 0  No charge, not seen. Phone encounter

## 2015-01-11 ENCOUNTER — Other Ambulatory Visit: Payer: Self-pay | Admitting: Family Medicine

## 2015-01-11 NOTE — Telephone Encounter (Signed)
Patient requesting refill. 

## 2015-01-30 ENCOUNTER — Encounter
Admission: RE | Admit: 2015-01-30 | Discharge: 2015-01-30 | Disposition: A | Discharge status: Home / Self Care | Payer: BLUE CROSS/BLUE SHIELD | Source: Ambulatory Visit | Attending: Obstetrics & Gynecology | Admitting: Obstetrics & Gynecology

## 2015-01-30 DIAGNOSIS — Z8249 Family history of ischemic heart disease and other diseases of the circulatory system: Secondary | ICD-10-CM | POA: Diagnosis not present

## 2015-01-30 DIAGNOSIS — K589 Irritable bowel syndrome without diarrhea: Secondary | ICD-10-CM | POA: Diagnosis not present

## 2015-01-30 DIAGNOSIS — Z880 Allergy status to penicillin: Secondary | ICD-10-CM | POA: Diagnosis not present

## 2015-01-30 DIAGNOSIS — N852 Hypertrophy of uterus: Secondary | ICD-10-CM | POA: Diagnosis not present

## 2015-01-30 DIAGNOSIS — N920 Excessive and frequent menstruation with regular cycle: Secondary | ICD-10-CM | POA: Diagnosis not present

## 2015-01-30 DIAGNOSIS — Z79899 Other long term (current) drug therapy: Secondary | ICD-10-CM | POA: Diagnosis not present

## 2015-01-30 DIAGNOSIS — D649 Anemia, unspecified: Secondary | ICD-10-CM | POA: Diagnosis not present

## 2015-01-30 HISTORY — DX: Anxiety disorder, unspecified: F41.9

## 2015-01-30 HISTORY — DX: Major depressive disorder, single episode, unspecified: F32.9

## 2015-01-30 HISTORY — DX: Depression, unspecified: F32.A

## 2015-01-30 HISTORY — DX: Anemia, unspecified: D64.9

## 2015-01-30 HISTORY — DX: Restless legs syndrome: G25.81

## 2015-01-30 LAB — CBC
HCT: 39.8 % (ref 35.0–47.0)
HEMOGLOBIN: 13.5 g/dL (ref 12.0–16.0)
MCH: 29.4 pg (ref 26.0–34.0)
MCHC: 34 g/dL (ref 32.0–36.0)
MCV: 86.3 fL (ref 80.0–100.0)
Platelets: 242 10*3/uL (ref 150–440)
RBC: 4.61 MIL/uL (ref 3.80–5.20)
RDW: 19.9 % — ABNORMAL HIGH (ref 11.5–14.5)
WBC: 8.1 10*3/uL (ref 3.6–11.0)

## 2015-01-30 LAB — ABO/RH: ABO/RH(D): A POS

## 2015-01-30 LAB — TYPE AND SCREEN
ABO/RH(D): A POS
ANTIBODY SCREEN: NEGATIVE

## 2015-01-30 NOTE — Patient Instructions (Signed)
  Your procedure is scheduled on: February 02, 2015 (Thursday) Report to Day Surgery.[facility](Medical Mall) Second Floor To find out your arrival time please call [phone] between 1PM - 3PM on February 01, 2015 (Wednesday).  Remember: Instructions that are not followed completely may result in serious medical risk, up to and including death, or upon the discretion of your surgeon and anesthesiologist your surgery may need to be rescheduled.    __x__ 1. Do not eat food or drink liquids after midnight. No gum chewing or hard candies.     ____ 2. No Alcohol for 24 hours before or after surgery.   ____ 3. Bring all medications with you on the day of surgery if instructed.    __x__ 4. Notify your doctor if there is any change in your medical condition     (cold, fever, infections).     Do not wear jewelry, make-up, hairpins, clips or nail polish.  Do not wear lotions, powders, or perfumes. You may wear deodorant.  Do not shave 48 hours prior to surgery. Men may shave face and neck.  Do not bring valuables to the hospital.    [facility] not [facility] for any belongings or valuables.               Contacts, dentures or bridgework may not be worn into surgery.  Leave your suitcase in the car. After surgery it may be brought to your room.  For patients admitted to the hospital, discharge time is determined by your                treatment team.   Patients discharged the day of surgery will not be allowed to drive home.   Please read over the following fact sheets that you were given:   Surgical Site Infection Prevention   ____ Take these medicines the morning of surgery with A SIP OF WATER:    1. Protonix  2. Xanax   3.  ____ Fleet Enema (as directed)   __x__ Use CHG Soap as directed  ____ Use inhalers on the day of surgery  ____ Stop metformin 2 days prior to surgery    ____ Take 1/2 of usual insulin dose the night before surgery and none on the morning of surgery.   ____  Stop Coumadin/Plavix/aspirin on   __x__ Stop Anti-inflammatories on (Stop Ibuprofen NOW)Tylenol ok to take for pain if needed)   ____ Stop supplements until after surgery.    ____ Bring C-Pap to the hospital.

## 2015-02-02 ENCOUNTER — Encounter
Admission: RE | Disposition: A | Discharge status: Home / Self Care | Payer: Self-pay | Source: Ambulatory Visit | Attending: Obstetrics & Gynecology

## 2015-02-02 ENCOUNTER — Ambulatory Visit: Payer: BLUE CROSS/BLUE SHIELD | Admitting: Anesthesiology

## 2015-02-02 ENCOUNTER — Observation Stay
Admission: RE | Admit: 2015-02-02 | Discharge: 2015-02-03 | Disposition: A | Discharge status: Home / Self Care | Payer: BLUE CROSS/BLUE SHIELD | Source: Ambulatory Visit | Attending: Obstetrics & Gynecology | Admitting: Obstetrics & Gynecology

## 2015-02-02 DIAGNOSIS — K589 Irritable bowel syndrome without diarrhea: Secondary | ICD-10-CM | POA: Insufficient documentation

## 2015-02-02 DIAGNOSIS — Z90710 Acquired absence of both cervix and uterus: Secondary | ICD-10-CM | POA: Diagnosis present

## 2015-02-02 DIAGNOSIS — Z79899 Other long term (current) drug therapy: Secondary | ICD-10-CM | POA: Insufficient documentation

## 2015-02-02 DIAGNOSIS — N920 Excessive and frequent menstruation with regular cycle: Secondary | ICD-10-CM | POA: Diagnosis not present

## 2015-02-02 DIAGNOSIS — N852 Hypertrophy of uterus: Secondary | ICD-10-CM | POA: Insufficient documentation

## 2015-02-02 DIAGNOSIS — Z880 Allergy status to penicillin: Secondary | ICD-10-CM | POA: Insufficient documentation

## 2015-02-02 DIAGNOSIS — D500 Iron deficiency anemia secondary to blood loss (chronic): Secondary | ICD-10-CM | POA: Diagnosis present

## 2015-02-02 DIAGNOSIS — Z8249 Family history of ischemic heart disease and other diseases of the circulatory system: Secondary | ICD-10-CM | POA: Insufficient documentation

## 2015-02-02 DIAGNOSIS — D649 Anemia, unspecified: Secondary | ICD-10-CM | POA: Insufficient documentation

## 2015-02-02 HISTORY — PX: LAPAROSCOPIC BILATERAL SALPINGECTOMY: SHX5889

## 2015-02-02 HISTORY — PX: CYSTOSCOPY: SHX5120

## 2015-02-02 HISTORY — PX: LAPAROSCOPIC HYSTERECTOMY: SHX1926

## 2015-02-02 LAB — POCT PREGNANCY, URINE: PREG TEST UR: NEGATIVE

## 2015-02-02 SURGERY — HYSTERECTOMY, TOTAL, LAPAROSCOPIC
Anesthesia: General

## 2015-02-02 MED ORDER — PROPOFOL 10 MG/ML IV BOLUS
INTRAVENOUS | Status: DC | PRN
  Administered 2015-02-02: 150 mg via INTRAVENOUS

## 2015-02-02 MED ORDER — PHENYLEPHRINE HCL 10 MG/ML IJ SOLN
INTRAMUSCULAR | Status: DC | PRN
  Administered 2015-02-02 (×3): 100 ug via INTRAVENOUS

## 2015-02-02 MED ORDER — OXYCODONE HCL 5 MG PO TABS: 5.0000 mg | ORAL_TABLET | Freq: Once | ORAL | Status: DC | PRN

## 2015-02-02 MED ORDER — FENTANYL CITRATE (PF) 100 MCG/2ML IJ SOLN
INTRAMUSCULAR | Status: AC
  Administered 2015-02-02: 50 ug via INTRAVENOUS
  Filled 2015-02-02: qty 2

## 2015-02-02 MED ORDER — LACTATED RINGERS IV SOLN
INTRAVENOUS | Status: DC
  Administered 2015-02-02: 16:00:00 via INTRAVENOUS

## 2015-02-02 MED ORDER — ONDANSETRON HCL 4 MG/2ML IJ SOLN
INTRAMUSCULAR | Status: DC | PRN
  Administered 2015-02-02: 4 mg via INTRAVENOUS

## 2015-02-02 MED ORDER — BISACODYL 10 MG RE SUPP: 10.0000 mg | Freq: Every day | RECTAL | Status: DC | PRN

## 2015-02-02 MED ORDER — FAMOTIDINE 20 MG PO TABS: 20.0000 mg | ORAL_TABLET | Freq: Once | ORAL | Status: DC

## 2015-02-02 MED ORDER — ROCURONIUM BROMIDE 100 MG/10ML IV SOLN
INTRAVENOUS | Status: DC | PRN
  Administered 2015-02-02: 20 mg via INTRAVENOUS
  Administered 2015-02-02 (×2): 10 mg via INTRAVENOUS

## 2015-02-02 MED ORDER — DEXAMETHASONE SODIUM PHOSPHATE 4 MG/ML IJ SOLN
INTRAMUSCULAR | Status: DC | PRN
  Administered 2015-02-02: 10 mg via INTRAVENOUS

## 2015-02-02 MED ORDER — OXYCODONE-ACETAMINOPHEN 5-325 MG PO TABS
1.0000 | ORAL_TABLET | ORAL | Status: DC | PRN
  Administered 2015-02-03 (×4): 2 via ORAL
  Filled 2015-02-02 (×4): qty 2

## 2015-02-02 MED ORDER — FAMOTIDINE 20 MG PO TABS
ORAL_TABLET | ORAL | Status: AC
  Filled 2015-02-02: qty 1

## 2015-02-02 MED ORDER — ACETAMINOPHEN 10 MG/ML IV SOLN
INTRAVENOUS | Status: AC
  Filled 2015-02-02: qty 100

## 2015-02-02 MED ORDER — BUPIVACAINE HCL (PF) 0.5 % IJ SOLN
INTRAMUSCULAR | Status: DC | PRN
  Administered 2015-02-02: 12 mL

## 2015-02-02 MED ORDER — MIDAZOLAM HCL 2 MG/2ML IJ SOLN
INTRAMUSCULAR | Status: DC | PRN
  Administered 2015-02-02: 2 mg via INTRAVENOUS

## 2015-02-02 MED ORDER — PANTOPRAZOLE SODIUM 40 MG PO TBEC: 40.0000 mg | DELAYED_RELEASE_TABLET | Freq: Every day | ORAL | Status: DC

## 2015-02-02 MED ORDER — PAROXETINE HCL 10 MG PO TABS
10.0000 mg | ORAL_TABLET | Freq: Two times a day (BID) | ORAL | Status: DC
  Administered 2015-02-02: 10 mg via ORAL
  Filled 2015-02-02 (×3): qty 1

## 2015-02-02 MED ORDER — FENTANYL CITRATE (PF) 100 MCG/2ML IJ SOLN
INTRAMUSCULAR | Status: DC | PRN
  Administered 2015-02-02 (×2): 100 ug via INTRAVENOUS

## 2015-02-02 MED ORDER — BUPIVACAINE HCL (PF) 0.5 % IJ SOLN
INTRAMUSCULAR | Status: AC
  Filled 2015-02-02: qty 30

## 2015-02-02 MED ORDER — SIMETHICONE 80 MG PO CHEW: 80.0000 mg | CHEWABLE_TABLET | Freq: Four times a day (QID) | ORAL | Status: DC | PRN

## 2015-02-02 MED ORDER — SENNOSIDES-DOCUSATE SODIUM 8.6-50 MG PO TABS: 1.0000 | ORAL_TABLET | Freq: Every evening | ORAL | Status: DC | PRN

## 2015-02-02 MED ORDER — CLINDAMYCIN PHOSPHATE 900 MG/50ML IV SOLN
INTRAVENOUS | Status: AC
  Administered 2015-02-02: 900 mg via INTRAVENOUS
  Filled 2015-02-02: qty 50

## 2015-02-02 MED ORDER — KETOROLAC TROMETHAMINE 30 MG/ML IJ SOLN
INTRAMUSCULAR | Status: AC
  Administered 2015-02-02: 30 mg via INTRAVENOUS
  Filled 2015-02-02: qty 1

## 2015-02-02 MED ORDER — LACTATED RINGERS IV SOLN
INTRAVENOUS | Status: DC
  Administered 2015-02-02 (×2): via INTRAVENOUS

## 2015-02-02 MED ORDER — DOCUSATE SODIUM 100 MG PO CAPS
100.0000 mg | ORAL_CAPSULE | Freq: Two times a day (BID) | ORAL | Status: DC
  Administered 2015-02-02: 100 mg via ORAL
  Filled 2015-02-02: qty 1

## 2015-02-02 MED ORDER — SODIUM CHLORIDE 0.9 % IJ SOLN
INTRAMUSCULAR | Status: AC
  Filled 2015-02-02: qty 3

## 2015-02-02 MED ORDER — LIDOCAINE HCL (CARDIAC) 20 MG/ML IV SOLN
INTRAVENOUS | Status: DC | PRN
  Administered 2015-02-02: 100 mg via INTRAVENOUS

## 2015-02-02 MED ORDER — CYCLOBENZAPRINE HCL 10 MG PO TABS
10.0000 mg | ORAL_TABLET | Freq: Every day | ORAL | Status: DC | PRN
  Filled 2015-02-02: qty 1

## 2015-02-02 MED ORDER — SUGAMMADEX SODIUM 200 MG/2ML IV SOLN
INTRAVENOUS | Status: DC | PRN
  Administered 2015-02-02: 147 mg via INTRAVENOUS

## 2015-02-02 MED ORDER — MORPHINE SULFATE (PF) 2 MG/ML IV SOLN
1.0000 mg | INTRAVENOUS | Status: DC | PRN
  Administered 2015-02-02 – 2015-02-03 (×4): 2 mg via INTRAVENOUS
  Filled 2015-02-02 (×4): qty 1

## 2015-02-02 MED ORDER — ONDANSETRON HCL 4 MG/2ML IJ SOLN: 4.0000 mg | Freq: Four times a day (QID) | INTRAMUSCULAR | Status: DC | PRN

## 2015-02-02 MED ORDER — ACETAMINOPHEN 325 MG PO TABS: 650.0000 mg | ORAL_TABLET | ORAL | Status: DC | PRN

## 2015-02-02 MED ORDER — FENTANYL CITRATE (PF) 100 MCG/2ML IJ SOLN
25.0000 ug | INTRAMUSCULAR | Status: DC | PRN
  Administered 2015-02-02: 50 ug via INTRAVENOUS

## 2015-02-02 MED ORDER — ONDANSETRON HCL 4 MG PO TABS
4.0000 mg | ORAL_TABLET | Freq: Four times a day (QID) | ORAL | Status: DC | PRN
Start: 2015-02-02 — End: 2015-02-03

## 2015-02-02 MED ORDER — SUCCINYLCHOLINE CHLORIDE 20 MG/ML IJ SOLN
INTRAMUSCULAR | Status: DC | PRN
  Administered 2015-02-02: 100 mg via INTRAVENOUS
  Administered 2015-02-02: 150 mg via INTRAVENOUS
  Administered 2015-02-02: 100 mg via INTRAVENOUS

## 2015-02-02 MED ORDER — ZOLPIDEM TARTRATE 5 MG PO TABS
5.0000 mg | ORAL_TABLET | Freq: Every evening | ORAL | Status: DC | PRN
  Administered 2015-02-03: 5 mg via ORAL
  Filled 2015-02-02: qty 1

## 2015-02-02 MED ORDER — KETOROLAC TROMETHAMINE 30 MG/ML IJ SOLN
30.0000 mg | Freq: Four times a day (QID) | INTRAMUSCULAR | Status: DC
  Administered 2015-02-02: 30 mg via INTRAVENOUS

## 2015-02-02 MED ORDER — OXYCODONE HCL 5 MG/5ML PO SOLN
5.0000 mg | Freq: Once | ORAL | Status: DC | PRN
Start: 2015-02-02 — End: 2015-02-02

## 2015-02-02 MED ORDER — SODIUM CHLORIDE 0.9 % IV SOLN
INTRAVENOUS | Status: DC | PRN
  Administered 2015-02-02: 08:00:00 via INTRAVENOUS

## 2015-02-02 MED ORDER — AMMONIA AROMATIC IN INHA
RESPIRATORY_TRACT | Status: AC
  Administered 2015-02-02: 14:00:00
  Filled 2015-02-02: qty 10

## 2015-02-02 MED ORDER — CLINDAMYCIN PHOSPHATE 900 MG/50ML IV SOLN
900.0000 mg | Freq: Once | INTRAVENOUS | Status: AC
  Administered 2015-02-02: 900 mg via INTRAVENOUS

## 2015-02-02 MED ORDER — ALPRAZOLAM 0.5 MG PO TABS
0.5000 mg | ORAL_TABLET | Freq: Two times a day (BID) | ORAL | Status: DC | PRN
  Administered 2015-02-02: 0.5 mg via ORAL
  Filled 2015-02-02: qty 1

## 2015-02-02 MED ORDER — KETOROLAC TROMETHAMINE 30 MG/ML IJ SOLN
30.0000 mg | Freq: Four times a day (QID) | INTRAMUSCULAR | Status: AC
  Administered 2015-02-02 – 2015-02-03 (×3): 30 mg via INTRAVENOUS
  Filled 2015-02-02 (×2): qty 1

## 2015-02-02 SURGICAL SUPPLY — 49 items
APL SRG 38 LTWT LNG FL B (MISCELLANEOUS) ×3
APPLICATOR ARISTA FLEXITIP XL (MISCELLANEOUS) ×1 IMPLANT
BAG URO DRAIN 2000ML W/SPOUT (MISCELLANEOUS) ×4 IMPLANT
BLADE SURG SZ11 CARB STEEL (BLADE) ×4 IMPLANT
CANISTER SUCT 1200ML W/VALVE (MISCELLANEOUS) ×4 IMPLANT
CATH FOLEY 2WAY  5CC 16FR (CATHETERS) ×1
CATH FOLEY 2WAY 5CC 16FR (CATHETERS) ×3
CATH URTH 16FR FL 2W BLN LF (CATHETERS) ×3 IMPLANT
CHLORAPREP W/TINT 26ML (MISCELLANEOUS) ×4 IMPLANT
DEFOGGER SCOPE WARMER CLEARIFY (MISCELLANEOUS) ×4 IMPLANT
DEVICE SUTURE ENDOST 10MM (ENDOMECHANICALS) ×1 IMPLANT
DRSG TEGADERM 2-3/8X2-3/4 SM (GAUZE/BANDAGES/DRESSINGS) IMPLANT
ENDOSTITCH 0 SINGLE 48 (SUTURE) ×8 IMPLANT
GAUZE SPONGE NON-WVN 2X2 STRL (MISCELLANEOUS) IMPLANT
GLOVE BIO SURGEON STRL SZ8 (GLOVE) ×20 IMPLANT
GLOVE INDICATOR 8.0 STRL GRN (GLOVE) ×8 IMPLANT
GOWN STRL REUS W/ TWL LRG LVL3 (GOWN DISPOSABLE) ×3 IMPLANT
GOWN STRL REUS W/ TWL XL LVL3 (GOWN DISPOSABLE) ×3 IMPLANT
GOWN STRL REUS W/TWL LRG LVL3 (GOWN DISPOSABLE) ×4
GOWN STRL REUS W/TWL XL LVL3 (GOWN DISPOSABLE) ×4
HEMOSTAT ARISTA ABSORB 1G (Miscellaneous) ×1 IMPLANT
IRRIGATION STRYKERFLOW (MISCELLANEOUS) ×3 IMPLANT
IRRIGATOR STRYKERFLOW (MISCELLANEOUS) ×4
IV LACTATED RINGERS 1000ML (IV SOLUTION) ×4 IMPLANT
KIT PINK PAD W/HEAD ARE REST (MISCELLANEOUS) ×4
KIT PINK PAD W/HEAD ARM REST (MISCELLANEOUS) ×3 IMPLANT
KIT RM TURNOVER CYSTO AR (KITS) ×4 IMPLANT
LABEL OR SOLS (LABEL) ×4 IMPLANT
LIQUID BAND (GAUZE/BANDAGES/DRESSINGS) ×4 IMPLANT
MANIPULATOR VCARE LG CRV RETR (MISCELLANEOUS) IMPLANT
MANIPULATOR VCARE STD CRV RETR (MISCELLANEOUS) IMPLANT
NEEDLE VERESS 14GA 120MM (NEEDLE) ×4 IMPLANT
NS IRRIG 500ML POUR BTL (IV SOLUTION) ×4 IMPLANT
OCCLUDER COLPOPNEUMO (BALLOONS) ×4 IMPLANT
PACK GYN LAPAROSCOPIC (MISCELLANEOUS) ×4 IMPLANT
PAD OB MATERNITY 4.3X12.25 (PERSONAL CARE ITEMS) ×4 IMPLANT
PAD PREP 24X41 OB/GYN DISP (PERSONAL CARE ITEMS) ×4 IMPLANT
SCISSORS METZENBAUM CVD 33 (INSTRUMENTS) ×1 IMPLANT
SET CYSTO W/LG BORE CLAMP LF (SET/KITS/TRAYS/PACK) ×4 IMPLANT
SHEARS HARMONIC ACE PLUS 36CM (ENDOMECHANICALS) ×4 IMPLANT
SLEEVE ENDOPATH XCEL 5M (ENDOMECHANICALS) ×4 IMPLANT
SPONGE LAP 18X18 5 PK (GAUZE/BANDAGES/DRESSINGS) ×3 IMPLANT
SPONGE VERSALON 2X2 STRL (MISCELLANEOUS)
SUT VIC AB 2-0 UR6 27 (SUTURE) IMPLANT
SYR 50ML LL SCALE MARK (SYRINGE) ×4 IMPLANT
SYRINGE 10CC LL (SYRINGE) ×4 IMPLANT
TROCAR ENDO BLADELESS 11MM (ENDOMECHANICALS) ×4 IMPLANT
TROCAR XCEL NON-BLD 5MMX100MML (ENDOMECHANICALS) ×4 IMPLANT
TUBING INSUFFLATOR HEATED (MISCELLANEOUS) ×4 IMPLANT

## 2015-02-02 NOTE — Op Note (Signed)
Operative Note:  PRE-OP DIAGNOSIS: MENORRHAGIA    POST-OP DIAGNOSIS: MENORRHAGIA    PROCEDURE: Procedure(s): HYSTERECTOMY TOTAL LAPAROSCOPIC LAPAROSCOPIC BILATERAL SALPINGECTOMY CYSTOSCOPY  SURGEON: Annamarie MajorPaul Dmauri Rosenow, MD, FACOG  ASSISTANT: Dr Bonney AidStaebler   ANESTHESIA: General endotracheal anesthesia  ESTIMATED BLOOD LOSS: 200   SPECIMENS: Uterus, Tubes.  COMPLICATIONS: None  DISPOSITION: stable to PACU  FINDINGS: Intraabdominal adhesions were not noted. Enlarged uterus, no lesions. Normal ovaries.  PROCEDURE:  The patient was taken to the OR where anesthesia was administed. She was prepped and draped in the normal sterile fashion in the dorsal lithotomy position in the [place] stirrups. A time out was performed. A Graves speculum was inserted, the cervix was grasped with a single tooth tenaculum and the endometrial cavity was sounded.  A V-Care uterine manipulator was inserted in the usual fashion without incident. Gloves were changed and attention was turned to the abdomen.   An infraumbilical transverse 5mm skin incision was made with the scalpel after local anesthesia applied to the skin. A Veress-step needle was inserted in the usual fashion and confirmed using the hanging drop technique. A pneumoperitoneum was obtained by insufflation of CO2 (opening pressure of 4mmHg) to 15mmHg. A diagnostic laparoscopy was performed yielding the previously described findings. Attention was turned to the left lower quadrant where after visualization of the inferior epigastric vessels a 5mm skin incision was made with the scalpel. A 5 mm laparoscopic port was inserted. The same procedure was repeated in the right lower quadrant with a 11mm trocar. Attention was turned to the left aspect of the uterus, where after visualization of the ureter, the round ligament was coagulated and transected using the 5mm Harmonic Scapel. The anterior and posterior leafs of the broad ligament were dissected off as the anterior  one was coagulated and transected in a caudal direction towards the cuff of the uterine manipulator. The vesicouterine reflection of the peritoneum was dissected with the harmonic scapel and the bladder flap was created bluntly. Attention was then turned to the left fallopian tube which was recognized by visualization of the fimbria. First the mesosalpinx and then the utero-ovarian ligament were coagulated and transected using the Harmonic Scapel. Attention was turned to the right aspect of the uterus where the same procedure was performed. The uterine vessels were sealed and transected bilaterally using first bipolar cautery and then the harmonic scapel. A 360 degree, circumferential colpotomy was done to completely amputate the uterus with cervix and tubes. Once the specimen was amputated it was delivered through the vagina.   The colpotomy was repaired in a simple interrupted ashion using a 0-Polysorb suture with an endo-stitch device.  Vaginal exam confirms complete closure.  The cavity was copiously irrigated. A survey of the pelvic cavity revealed adequate hemostasis and no injury to bowel, bladder, or ureter.  A diagnostic cystoscopy was performed using saline distension of bladder, with bilateral urine flow from each ureteral orifice.  No bladder lesions or injuries seen.  Foley cathater replaced.   At this point the procedure was finalized. All the instruments were removed from the patient's body. Gas was expelled and patient is leveled.  Incisions are closed with skin adhesive.    Patient goes to recovery room in stable condition.  All sponge, instrument, and needle counts are correct x2.

## 2015-02-02 NOTE — Discharge Instructions (Signed)
Total Laparoscopic Hysterectomy, Care After °Refer to this sheet in the next few weeks. These instructions provide you with information on caring for yourself after your procedure. Your health care provider may also give you more specific instructions. Your treatment has been planned according to current medical practices, but problems sometimes occur. Call your health care provider if you have any problems or questions after your procedure. °WHAT TO EXPECT AFTER THE PROCEDURE °· Pain and bruising at the incision sites. You will be given pain medicine to control it. °· Menopausal symptoms such as hot flashes, night sweats, and insomnia if your ovaries were removed. °· Sore throat from the breathing tube that was inserted during surgery. °HOME CARE INSTRUCTIONS °· Only take over-the-counter or prescription medicines for pain, discomfort, or fever as directed by your health care provider.   °· Do not take aspirin. It can cause bleeding.   °· Do not drive when taking pain medicine.   °· Follow your health care provider's advice regarding diet, exercise, lifting, driving, and general activities.   °· Resume your usual diet as directed and allowed.   °· Get plenty of rest and sleep.   °· Do not douche, use tampons, or have sexual intercourse for at least 6 weeks, or until your health care provider gives you permission.   °· Change your bandages (dressings) as directed by your health care provider.   °· Monitor your temperature and notify your health care provider of a fever.   °· Take showers instead of baths for 2-3 weeks.   °· Do not drink alcohol until your health care provider gives you permission.   °· If you develop constipation, you may take a mild laxative with your health care provider's permission. Bran foods may help with constipation problems. Drinking enough fluids to keep your urine clear or pale yellow may help as well.   °· Try to have someone home with you for 1-2 weeks to help around the house.    °· Keep all of your follow-up appointments as directed by your health care provider.   °SEEK MEDICAL CARE IF: °· You have swelling, redness, or increasing pain around your incision sites.   °· You have pus coming from your incision.   °· You notice a bad smell coming from your incision.   °· Your incision breaks open.   °· You feel dizzy or lightheaded.   °· You have pain or bleeding when you urinate.   °· You have persistent diarrhea.   °· You have persistent nausea and vomiting.   °· You have abnormal vaginal discharge.   °· You have a rash.   °· You have any type of abnormal reaction or develop an allergy to your medicine.   °· You have poor pain control with your prescribed medicine.   °SEEK IMMEDIATE MEDICAL CARE IF: °· You have chest pain or shortness of breath. °· You have severe abdominal pain that is not relieved with pain medicine. °· You have pain or swelling in your legs. °MAKE SURE YOU: °· Understand these instructions. °· Will watch your condition. °· Will get help right away if you are not doing well or get worse. °  °This information is not intended to replace advice given to you by your health care provider. Make sure you discuss any questions you have with your health care provider. °  °Document Released: 11/25/2012 Document Revised: 02/09/2013 Document Reviewed: 11/25/2012 °Elsevier Interactive Patient Education ©2016 [employer] ° °

## 2015-02-02 NOTE — H&P (Signed)
History and Physical Interval Note:  02/02/2015 7:16 AM  Alicia [name]  has presented today for surgery, with the diagnosis of MENORRHAGIA AND ANEMIA  The various methods of treatment have been discussed with the patient and family. After consideration of risks, benefits and other options for treatment, the patient has consented to  Procedure(s): HYSTERECTOMY TOTAL LAPAROSCOPIC (N/A) LAPAROSCOPIC BILATERAL SALPINGECTOMY (Bilateral) as a surgical intervention .  The patient's history has been reviewed, patient examined, no change in status, stable for surgery.  Pt has the following beta blocker history-  Not taking Beta Blocker.  I have reviewed the patient's chart and labs.  Questions were answered to the patient's satisfaction.    Talisa Petrak Renae FicklePAUL

## 2015-02-02 NOTE — Anesthesia Preprocedure Evaluation (Signed)
Anesthesia Evaluation  Patient identified by MRN, date of birth, ID band Patient awake    Reviewed: Allergy & Precautions, H&P , NPO status , Patient's Chart, lab work & pertinent test results  History of Anesthesia Complications Negative for: history of anesthetic complications  Airway Mallampati: III  TM Distance: >3 FB Neck ROM: full    Dental no notable dental hx. (+) Teeth Intact   Pulmonary shortness of breath,    Pulmonary exam normal breath sounds clear to auscultation       Cardiovascular Exercise Tolerance: Good (-) angina(-) Past MI and (-) DOE negative cardio ROS Normal cardiovascular exam Rhythm:regular Rate:Normal     Neuro/Psych PSYCHIATRIC DISORDERS Anxiety Depression negative neurological ROS     GI/Hepatic Neg liver ROS, GERD  Controlled,  Endo/Other  negative endocrine ROS  Renal/GU negative Renal ROS  negative genitourinary   Musculoskeletal  (+) Arthritis ,   Abdominal   Peds  Hematology negative hematology ROS (+)   Anesthesia Other Findings Past Medical History:   Hyperlipidemia                                               IBS (irritable bowel syndrome)                               GERD (gastroesophageal reflux disease)                       Depression                                                   Anxiety                                                      Anemia                                                       Restless leg syndrome                                       Past Surgical History:   CESAREAN SECTION                                 2011         KNEE SURGERY                                    Right 2006           Comment:Knee Arthroscopy   DILATION AND CURETTAGE OF UTERUS  2003, 200*   BREAST SURGERY                                  Bilateral December *     Comment:Breast Reduction   HERNIA REPAIR                                    1999        Comment:Umbilical   ABDOMINOPLASTY                                   Feb. 2015   BMI    Body Mass Index   24.63 kg/m 2      Reproductive/Obstetrics negative OB ROS                             Anesthesia Physical Anesthesia Plan  ASA: III  Anesthesia Plan: General ETT   Post-op Pain Management:    Induction:   Airway Management Planned:   Additional Equipment:   Intra-op Plan:   Post-operative Plan:   Informed Consent: I have reviewed the patients History and Physical, chart, labs and discussed the procedure including the risks, benefits and alternatives for the proposed anesthesia with the patient or authorized representative who has indicated his/her understanding and acceptance.   Dental Advisory Given  Plan Discussed with: Anesthesiologist, CRNA and Surgeon  Anesthesia Plan Comments:         Anesthesia Quick Evaluation

## 2015-02-02 NOTE — Progress Notes (Signed)
Day of Surgery Procedure(s) (LRB): HYSTERECTOMY TOTAL LAPAROSCOPIC (N/A) LAPAROSCOPIC BILATERAL SALPINGECTOMY (Bilateral) CYSTOSCOPY  Subjective: Patient reports min pain, tol liquids well.    Objective: I have reviewed patient's vital signs, intake and output and medications.  Abd: Min T, ND Incision: clean, dry and intact Extr: no calf T, no edema  Assessment: s/p Procedure(s): HYSTERECTOMY TOTAL LAPAROSCOPIC (N/A) LAPAROSCOPIC BILATERAL SALPINGECTOMY (Bilateral) CYSTOSCOPY: stable  Plan: Advance diet Encourage ambulation Advance to PO medication     Brienne Liguori PAUL 02/02/2015, 2:26 PM

## 2015-02-02 NOTE — Anesthesia Postprocedure Evaluation (Signed)
Anesthesia Post Note  Patient: Alicia HornsDorothy V [name]  Procedure(s) Performed: Procedure(s) (LRB): HYSTERECTOMY TOTAL LAPAROSCOPIC (N/A) LAPAROSCOPIC BILATERAL SALPINGECTOMY (Bilateral) CYSTOSCOPY  Patient location during evaluation: PACU Anesthesia Type: General Level of consciousness: awake and alert Pain management: pain level controlled Vital Signs Assessment: post-procedure vital signs reviewed and stable Respiratory status: spontaneous breathing, nonlabored ventilation, respiratory function stable and patient connected to nasal cannula oxygen Cardiovascular status: blood pressure returned to baseline and stable Postop Assessment: no signs of nausea or vomiting Anesthetic complications: no    Last Vitals:  Filed Vitals:   02/02/15 1028 02/02/15 1051  BP: 101/51 102/47  Pulse: 101 102  Temp: 37.1 C 36.4 C  Resp: 14     Last Pain:  Filed Vitals:   02/02/15 1051  PainSc: 3                  Brandy Zuba K Dreyton Roessner

## 2015-02-02 NOTE — Transfer of Care (Signed)
Immediate Anesthesia Transfer of Care Note  Patient: Alicia [name]  Procedure(s) Performed: Procedure(s): HYSTERECTOMY TOTAL LAPAROSCOPIC (N/A) LAPAROSCOPIC BILATERAL SALPINGECTOMY (Bilateral) CYSTOSCOPY  Patient Location: PACU  Anesthesia Type:General  Level of Consciousness: awake, alert  and oriented  Airway & Oxygen Therapy: Patient Spontanous Breathing and Patient connected to face mask oxygen  Post-op Assessment: Report given to RN and Post -op Vital signs reviewed and stable  Post vital signs: Reviewed and stable  Last Vitals:  Filed Vitals:   02/02/15 0643  BP: 131/66  Pulse: 102  Temp: 37.3 C  Resp: 16    Complications: No apparent anesthesia complications

## 2015-02-03 DIAGNOSIS — N920 Excessive and frequent menstruation with regular cycle: Secondary | ICD-10-CM | POA: Diagnosis not present

## 2015-02-03 LAB — SURGICAL PATHOLOGY

## 2015-02-03 LAB — HEMOGLOBIN: Hemoglobin: 11 g/dL — ABNORMAL LOW (ref 12.0–16.0)

## 2015-02-03 NOTE — Discharge Summary (Signed)
Gynecology Physician Postoperative Discharge Summary  Patient ID: Alicia [name] MRN: [id number] DOB/AGE: [birth year] 44 y.o.  Admit Date: 02/02/2015 Discharge Date: 02/03/2015  Preoperative Diagnoses: Enlarged uterus and Abnormal uterine bleeding  Procedures: Procedure(s) (LRB): HYSTERECTOMY TOTAL LAPAROSCOPIC (N/A) LAPAROSCOPIC BILATERAL SALPINGECTOMY (Bilateral) CYSTOSCOPY  Significant Labs: CBC Latest Ref Rng 02/03/2015 01/30/2015 01/25/2012  WBC 3.6 - 11.0 K/uL - 8.1 8.9  Hemoglobin 12.0 - 16.0 g/dL 11.0(L) 13.5 10.0(L)  Hematocrit 35.0 - 47.0 % - 39.8 31.1(L)  Platelets 150 - 440 K/uL - 242 175    Hospital Course:  Alicia [name] is a 44 y.o. [id number]  admitted for scheduled surgery.  She underwent the procedures as mentioned above, her operation was uncomplicated. For further details about surgery, please refer to the operative report. Patient had an uncomplicated postoperative course. By time of discharge on POD#1, her pain was controlled on oral pain medications; she was ambulating, voiding without difficulty, tolerating regular diet and passing flatus. She was deemed stable for discharge to home.   Discharge Exam: Blood pressure 110/52, pulse 88, temperature 98.7 F (37.1 C), temperature source Oral, resp. rate 20, height  (1.727 m), weight 162 lb (73.483 kg), last menstrual period 01/15/2015, SpO2 100 %. General appearance: alert and no distress  Resp: clear to auscultation bilaterally  Cardio: regular rate and rhythm  GI: soft, non-tender; bowel sounds normal; no masses, no organomegaly.  Incision: C/D/I, no erythema, no drainage noted Pelvic: scant blood on pad  Extremities: extremities normal, atraumatic, no cyanosis or edema and Homans sign is negative, no sign of DVT  Discharged Condition: Stable  Disposition: 01-Home or Self Care  Discharge Instructions    No straining or heavy lifting    May shower   May drive in 4-5 days       Medication  List    TAKE these medications        ALPRAZolam 0.5 MG tablet  Commonly known as:  XANAX  Take 1 tablet (0.5 mg total) by mouth 2 (two) times daily.     ALPRAZolam 0.5 MG 24 hr tablet  Commonly known as:  XANAX XR  take 1 tablet by mouth once daily     CENTRUM PO  Take 1 tablet by mouth daily. Reported on 02/02/2015     cyclobenzaprine 10 MG tablet  Commonly known as:  FLEXERIL  Take 1 tablet (10 mg total) by mouth daily.     ibuprofen 600 MG tablet  Commonly known as:  ADVIL,MOTRIN  Take 1 tablet (600 mg total) by mouth every 6 (six) hours.     pantoprazole 40 MG tablet  Commonly known as:  PROTONIX  Take 1 tablet (40 mg total) by mouth daily.     PARoxetine 10 MG tablet  Commonly known as:  PAXIL  Take 1 tablet (10 mg total) by mouth 2 (two) times daily.     polyethylene glycol packet  Commonly known as:  MIRALAX / GLYCOLAX  Take 17 g by mouth daily as needed. Reported on 02/02/2015     zolpidem 10 MG tablet  Commonly known as:  AMBIEN  Take 1 tablet (10 mg total) by mouth at bedtime as needed.           Follow-up Information    Follow up with Letitia Libra, MD In 2 weeks.   Specialty:  Obstetrics and Gynecology   Why:  As Scheduled, For Post Op   Contact information:   [address] [place] [address] [phone]  Annamarie MajorPaul Amaranta Mehl, MD

## 2015-02-08 ENCOUNTER — Inpatient Hospital Stay: Payer: BLUE CROSS/BLUE SHIELD

## 2015-02-08 ENCOUNTER — Inpatient Hospital Stay (HOSPITAL_BASED_OUTPATIENT_CLINIC_OR_DEPARTMENT_OTHER): Payer: BLUE CROSS/BLUE SHIELD | Admitting: Oncology

## 2015-02-08 ENCOUNTER — Inpatient Hospital Stay: Payer: BLUE CROSS/BLUE SHIELD | Attending: Oncology

## 2015-02-08 VITALS — BP 134/61 | HR 106 | Temp 97.6°F | Resp 20 | Wt 162.7 lb

## 2015-02-08 DIAGNOSIS — R5383 Other fatigue: Secondary | ICD-10-CM | POA: Diagnosis not present

## 2015-02-08 DIAGNOSIS — E785 Hyperlipidemia, unspecified: Secondary | ICD-10-CM | POA: Diagnosis not present

## 2015-02-08 DIAGNOSIS — K219 Gastro-esophageal reflux disease without esophagitis: Secondary | ICD-10-CM | POA: Diagnosis not present

## 2015-02-08 DIAGNOSIS — F329 Major depressive disorder, single episode, unspecified: Secondary | ICD-10-CM | POA: Diagnosis not present

## 2015-02-08 DIAGNOSIS — Z808 Family history of malignant neoplasm of other organs or systems: Secondary | ICD-10-CM | POA: Insufficient documentation

## 2015-02-08 DIAGNOSIS — G2581 Restless legs syndrome: Secondary | ICD-10-CM

## 2015-02-08 DIAGNOSIS — R531 Weakness: Secondary | ICD-10-CM

## 2015-02-08 DIAGNOSIS — D509 Iron deficiency anemia, unspecified: Secondary | ICD-10-CM | POA: Diagnosis not present

## 2015-02-08 DIAGNOSIS — F419 Anxiety disorder, unspecified: Secondary | ICD-10-CM | POA: Insufficient documentation

## 2015-02-08 LAB — FOLATE: FOLATE: 13.4 ng/mL (ref >5.9)

## 2015-02-08 LAB — VITAMIN B12: VITAMIN B 12: 613 pg/mL (ref 180–914)

## 2015-02-08 LAB — CBC
HEMATOCRIT: 38.2 % (ref 35.0–47.0)
HEMOGLOBIN: 13 g/dL (ref 12.0–16.0)
MCH: 29.5 pg (ref 26.0–34.0)
MCHC: 34.1 g/dL (ref 32.0–36.0)
MCV: 86.5 fL (ref 80.0–100.0)
Platelets: 233 10*3/uL (ref 150–440)
RBC: 4.41 MIL/uL (ref 3.80–5.20)
RDW: 18.5 % — ABNORMAL HIGH (ref 11.5–14.5)
WBC: 7.1 10*3/uL (ref 3.6–11.0)

## 2015-02-08 LAB — IRON AND TIBC
IRON: 50 ug/dL (ref 28–170)
Saturation Ratios: 17 % (ref 10.4–31.8)
TIBC: 287 ug/dL (ref 250–450)
UIBC: 237 ug/dL

## 2015-02-08 LAB — FERRITIN: Ferritin: 64 ng/mL (ref 11–307)

## 2015-02-08 LAB — LACTATE DEHYDROGENASE: LDH: 97 U/L — AB (ref 98–192)

## 2015-02-08 NOTE — Progress Notes (Signed)
[facility]  [phone] [phone]  ID: Alicia [name] OB: 12/23/1970  MR#: [id number]  AOZ#:[id number]  Patient Care Team: Alicia Cory, MD as PCP - General (Family Medicine)  CHIEF COMPLAINT:  Chief Complaint  Patient presents with   Anemia    follow up    INTERVAL HISTORY: Patient is a 44 year old female who has a long-standing history of iron deficiency anemia requiring a blood transfusion in December 2013. She has been tried on oral iron supplementation but could not tolerate secondary to abdominal pain and constipation. Currently she feels weak and fatigued, however she cannot contribute this to her anemia as she has had a stressful few weeks with her husband recently passing due to a stroke and she also had a hysterectomy last Thursday.  She has no neurologic complaints. She denies any recent fevers or illnesses. She has no chest pain or shortness of breath. She denies any nausea, vomiting, constipation, or diarrhea. She has no urinary complaints. Patient offers no further specific complaints today.  REVIEW OF SYSTEMS:   Review of Systems  Constitutional: Positive for malaise/fatigue.  Respiratory: Negative.  Negative for shortness of breath.   Cardiovascular: Negative.  Negative for chest pain.  Gastrointestinal: Negative.  Negative for blood in stool and melena.  Musculoskeletal: Negative.   Neurological: Positive for weakness.    As per HPI. Otherwise, a complete review of systems is negatve.  PAST MEDICAL HISTORY: Past Medical History  Diagnosis Date   Hyperlipidemia    IBS (irritable bowel syndrome)    GERD (gastroesophageal reflux disease)    Depression    Anxiety    Anemia    Restless leg syndrome     PAST SURGICAL HISTORY: Past Surgical History  Procedure Laterality Date   Cesarean section  2011   Knee surgery Right 2006    Knee Arthroscopy   Dilation and curettage of uterus  2003, 2005, 2008    Breast surgery Bilateral December 2015    Breast Reduction   Hernia repair  1999    Umbilical   Abdominoplasty  Feb. 2015   Laparoscopic hysterectomy N/A 02/02/2015    Procedure: HYSTERECTOMY TOTAL LAPAROSCOPIC;  Surgeon: Alicia Mustard, MD;  Location: [facility];  Service: Gynecology;  Laterality: N/A;   Laparoscopic bilateral salpingectomy Bilateral 02/02/2015    Procedure: LAPAROSCOPIC BILATERAL SALPINGECTOMY;  Surgeon: Alicia Mustard, MD;  Location: [facility];  Service: Gynecology;  Laterality: Bilateral;   Cystoscopy  02/02/2015    Procedure: CYSTOSCOPY;  Surgeon: Alicia Mustard, MD;  Location: [facility];  Service: Gynecology;;    FAMILY HISTORY Family History  Problem Relation Age of Onset   Hypertension Mother    Hyperlipidemia Mother    Heart Problems Father     hole in heart   Prostate cancer Maternal Grandfather        ADVANCED DIRECTIVES:    HEALTH MAINTENANCE: Social History  Substance Use Topics   Smoking status: Never Smoker    Smokeless tobacco: Never Used   Alcohol Use: No     Colonoscopy:  PAP:  Bone density:  Lipid panel:  Allergies  Allergen Reactions   Azithromycin Diarrhea   Peanuts [Peanut Oil] Swelling    "swelling of lips"    Penicillins Hives and Swelling    Current Outpatient Prescriptions  Medication Sig Dispense Refill   ALPRAZolam (XANAX) 0.5 MG tablet Take 1 tablet (0.5 mg total) by mouth 2 (two) times daily. (Patient taking differently: Take 0.5 mg by mouth 2 (  two) times daily as needed. ) 60 tablet 3   cyclobenzaprine (FLEXERIL) 10 MG tablet Take 1 tablet (10 mg total) by mouth daily. 30 tablet 5   Multiple Vitamins-Minerals (CENTRUM PO) Take 1 tablet by mouth daily. Reported on 02/02/2015     oxyCODONE-acetaminophen (PERCOCET/ROXICET) 5-325 MG tablet Take 1 tablet by mouth every 4 (four) hours as needed for severe pain.     pantoprazole (PROTONIX) 40 MG tablet Take 1 tablet (40 mg total) by mouth daily. (Patient  taking differently: Take 40 mg by mouth at bedtime. ) 30 tablet 5   PARoxetine (PAXIL) 10 MG tablet Take 1 tablet (10 mg total) by mouth 2 (two) times daily. 60 tablet 5   zolpidem (AMBIEN) 10 MG tablet Take 1 tablet (10 mg total) by mouth at bedtime as needed. 30 tablet 5   No current facility-administered medications for this visit.    OBJECTIVE: Filed Vitals:   02/08/15 1413  BP: 134/61  Pulse: 106  Temp: 97.6 F (36.4 C)  Resp: 20     Body mass index is 24.74 kg/(m^2).    ECOG FS:0 - Asymptomatic  General: Well-developed, well-nourished, no acute distress. Eyes: Pink conjunctiva, anicteric sclera. Lungs: Clear to auscultation bilaterally. Heart: Regular rate and rhythm. No rubs, murmurs or gallops. Abdomen: Soft, nontender, nondistended. No organomegaly noted, normoactive bowel sounds. Musculoskeletal: No edema, cyanosis, or clubbing. Neuro: Alert, answering all questions appropriately. Cranial nerves grossly intact. Skin: No rashes or petechiae noted. Psych: Normal affect.   LAB RESULTS:  Lab Results  Component Value Date   NA 140 08/25/2014   K 4.0 01/25/2012   CL 104 01/25/2012   CO2 26 01/25/2012   GLUCOSE 73 01/25/2012   BUN 18 08/25/2014   CREATININE 0.9 08/25/2014   CALCIUM 9.2 01/25/2012   GFRNONAA 81* 01/25/2012   GFRAA >90 01/25/2012    Lab Results  Component Value Date   WBC 7.1 02/08/2015   NEUTROABS 7.2 01/23/2012   HGB 13.0 02/08/2015   HCT 38.2 02/08/2015   MCV 86.5 02/08/2015   PLT 233 02/08/2015     STUDIES: No results found.  ASSESSMENT: Iron deficiency anemia.  PLAN:    1. Iron deficiency anemia: Hemoglobin today is 13.0. Her iron stores are also normal within normal limits with iron saturation of 17%, total iron of 50, and a ferritin of 64. Patient does not require IV Feraheme today. Patient will return to clinic in 4 months for repeat laboratory work and further evaluation. Her hysterectomy may have corrected the anemia  therefore if iron stores are normal at that time she may not any further follow-up.  Patient expressed understanding and was in agreement with this plan. She also understands that She can call clinic at any time with any questions, concerns, or complaints.    Alicia [name]   02/08/2015 4:30 PM   Patient was seen and evaluated and I agree with the assessment and plan as above.

## 2015-02-08 NOTE — Progress Notes (Signed)
Patient here today for follow up regarding anemia. Patient had hysterectomy performed last week, denies any concerns today.

## 2015-02-09 LAB — ERYTHROPOIETIN: ERYTHROPOIETIN: 11.1 m[IU]/mL (ref 2.6–18.5)

## 2015-02-09 LAB — ANA W/REFLEX: Anti Nuclear Antibody(ANA): NEGATIVE

## 2015-02-09 LAB — HAPTOGLOBIN: HAPTOGLOBIN: 123 mg/dL (ref 34–200)

## 2015-02-10 ENCOUNTER — Other Ambulatory Visit: Payer: Self-pay | Admitting: Family Medicine

## 2015-02-10 NOTE — Telephone Encounter (Signed)
Patient requesting refill. 

## 2015-02-14 ENCOUNTER — Encounter: Payer: Self-pay | Admitting: Family Medicine

## 2015-02-14 ENCOUNTER — Ambulatory Visit (INDEPENDENT_AMBULATORY_CARE_PROVIDER_SITE_OTHER): Payer: BLUE CROSS/BLUE SHIELD | Admitting: Family Medicine

## 2015-02-14 ENCOUNTER — Other Ambulatory Visit: Payer: Self-pay | Admitting: Nurse Practitioner

## 2015-02-14 VITALS — BP 110/62 | HR 104 | Temp 98.1°F | Resp 16 | Ht 68.0 in | Wt 162.0 lb

## 2015-02-14 DIAGNOSIS — R000 Tachycardia, unspecified: Secondary | ICD-10-CM | POA: Diagnosis not present

## 2015-02-14 DIAGNOSIS — G8929 Other chronic pain: Secondary | ICD-10-CM | POA: Insufficient documentation

## 2015-02-14 DIAGNOSIS — D500 Iron deficiency anemia secondary to blood loss (chronic): Secondary | ICD-10-CM

## 2015-02-14 DIAGNOSIS — E781 Pure hyperglyceridemia: Secondary | ICD-10-CM

## 2015-02-14 DIAGNOSIS — G4700 Insomnia, unspecified: Secondary | ICD-10-CM | POA: Diagnosis not present

## 2015-02-14 DIAGNOSIS — M542 Cervicalgia: Secondary | ICD-10-CM | POA: Diagnosis not present

## 2015-02-14 DIAGNOSIS — Z90710 Acquired absence of both cervix and uterus: Secondary | ICD-10-CM | POA: Diagnosis not present

## 2015-02-14 DIAGNOSIS — F411 Generalized anxiety disorder: Secondary | ICD-10-CM

## 2015-02-14 DIAGNOSIS — K219 Gastro-esophageal reflux disease without esophagitis: Secondary | ICD-10-CM

## 2015-02-14 DIAGNOSIS — F4321 Adjustment disorder with depressed mood: Secondary | ICD-10-CM

## 2015-02-14 MED ORDER — CYCLOBENZAPRINE HCL 10 MG PO TABS: 10.0000 mg | ORAL_TABLET | Freq: Every day | ORAL | Status: DC

## 2015-02-14 MED ORDER — ALPRAZOLAM 0.5 MG PO TABS: 0.5000 mg | ORAL_TABLET | Freq: Every evening | ORAL | Status: DC | PRN

## 2015-02-14 MED ORDER — PAROXETINE HCL 20 MG PO TABS: 20.0000 mg | ORAL_TABLET | Freq: Every day | ORAL | Status: DC

## 2015-02-14 MED ORDER — PANTOPRAZOLE SODIUM 40 MG PO TBEC: 40.0000 mg | DELAYED_RELEASE_TABLET | Freq: Every day | ORAL | Status: DC

## 2015-02-14 MED ORDER — ZOLPIDEM TARTRATE 10 MG PO TABS: 10.0000 mg | ORAL_TABLET | Freq: Every evening | ORAL | Status: DC | PRN

## 2015-02-14 MED ORDER — ALPRAZOLAM ER 1 MG PO TB24: 1.0000 mg | ORAL_TABLET | Freq: Every day | ORAL | Status: DC

## 2015-02-14 NOTE — Progress Notes (Signed)
Name: Alicia HornsDorothy V [name]   MRN: [id number]    DOB: Jul 09, 1970   Date:02/14/2015       Progress Note  Subjective  Chief Complaint  Chief Complaint  Patient presents with  . Medication Refill    6 month F/U  . Gastroesophageal Reflux    Unchanged  . Anxiety    Well controlled  . Insomnia    Medication helps, has trouble falling asleep, sleeps 6 hours nightly    HPI   Acute grieve/stress : Husband died suddenly of a brain aneurysm  on October 23 red, 2016. She has been taking her antidepressant medication and Alprazolam as prescribed, but is very anxious, agoraphobia is worse. Feels like her heart is always pounding in her chest. Feeling very nervous and anxious. We will increase dose of Alprazolam XR to 1 mg so she does not have to take as many of immediate release, currently taking every other day prn.  No side effects of medication, but seems to help her cope with stress.  Iron deficiency anemia: going on for many years, however worse since in 2013 when she had a blood transfusion in 01/2012 while pregnant with surrogate baby. She was getting iron infusion because she could not tolerate oral supplements, she had a hysterectomy Dec 15 th, 2016.   GERD: had EGD back in 2013, we will send her back to Dr. Shelle Ironein because of severe iron deficiency anemia , she was supposed to see him again but missed follow up. She will re-schedule it.   Insomnia: taking Ambien, since husband died it takes her 30 minutes before she fall asleep, able to stay asleep through the night.   Neck pain: she states usually at the end of the day, she has stiffness, and Flexeril helps with symptoms, no radiation   Patient Active Problem List   Diagnosis Date Noted  . GAD (generalized anxiety disorder) 02/14/2015  . Neck pain 02/14/2015  . History of hysterectomy 02/14/2015  . S/P hysterectomy 02/02/2015  . Grieving 12/16/2014  . Acute stress reaction 12/16/2014  . Iron deficiency anemia due to chronic blood loss  08/29/2014  . Insomnia, persistent 08/14/2014  . Major depression (HCC) 08/14/2014  . Temporomandibular joint sounds on opening and/or closing the jaw 08/14/2014  . Degenerative disc disease, lumbar 08/14/2014  . Bleeding internal hemorrhoids 08/14/2014  . Gastric reflux 08/14/2014  . Blood glucose elevated 08/14/2014  . Irritable bowel syndrome with constipation 08/14/2014  . Hypertriglyceridemia 08/14/2014  . Overweight 08/14/2014  . Restless legs syndrome 08/14/2014  . Tinnitus 08/14/2014  . Vitamin D deficiency 08/14/2014  . Tachycardia 11/25/2012  . DOE (dyspnea on exertion) 11/06/2012  . Previous cesarean delivery, delivered, with or without mention of antepartum condition 01/19/2012    Past Surgical History  Procedure Laterality Date  . Cesarean section  2011  . Knee surgery Right 2006    Knee Arthroscopy  . Dilation and curettage of uterus  2003, 2005, 2008  . Breast surgery Bilateral December 2015    Breast Reduction  . Hernia repair  1999    Umbilical  . Abdominoplasty  Feb. 2015  . Laparoscopic hysterectomy N/A 02/02/2015    Procedure: HYSTERECTOMY TOTAL LAPAROSCOPIC;  Surgeon: Nadara Mustardobert P Harris, MD;  Location: [facility];  Service: Gynecology;  Laterality: N/A;  . Laparoscopic bilateral salpingectomy Bilateral 02/02/2015    Procedure: LAPAROSCOPIC BILATERAL SALPINGECTOMY;  Surgeon: Nadara Mustardobert P Harris, MD;  Location: [facility];  Service: Gynecology;  Laterality: Bilateral;  . Cystoscopy  02/02/2015  Procedure: CYSTOSCOPY;  Surgeon: Nadara Mustard, MD;  Location: [facility];  Service: Gynecology;;    Family History  Problem Relation Age of Onset  . Hypertension Mother   . Hyperlipidemia Mother   . Heart Problems Father     hole in heart  . Prostate cancer Maternal Grandfather     Social History   Social History  . Marital Status: Widowed    Spouse Name: N/A  . Number of Children: 4  . Years of Education: N/A   Occupational History  . 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 Smoking status: Never Smoker   . Smokeless tobacco: Never Used  . Alcohol Use: No  . Drug Use: No  . Sexual Activity:    Partners: Male   Other Topics Concern  . Not on file   Social History Narrative     Current outpatient prescriptions:  .  ALPRAZolam (XANAX) 0.5 MG tablet, Take 1 tablet (0.5 mg total) by mouth at bedtime as needed for anxiety., Disp: 30 tablet, Rfl: 0 .  cyclobenzaprine (FLEXERIL) 10 MG tablet, Take 1 tablet (10 mg total) by mouth daily., Disp: 30 tablet, Rfl: 5 .  Multiple Vitamins-Minerals (CENTRUM PO), Take 1 tablet by mouth daily. Reported on 02/02/2015, Disp: , Rfl:  .  oxyCODONE-acetaminophen (PERCOCET/ROXICET) 5-325 MG tablet, Take 1 tablet by mouth every 4 (four) hours as needed for severe pain., Disp: , Rfl:  .  pantoprazole (PROTONIX) 40 MG tablet, Take 1 tablet (40 mg total) by mouth daily., Disp: 30 tablet, Rfl: 5 .  zolpidem (AMBIEN) 10 MG tablet, Take 1 tablet (10 mg total) by mouth at bedtime as needed., Disp: 30 tablet, Rfl: 5 .  ALPRAZolam (XANAX XR) 1 MG 24 hr tablet, Take 1 tablet (1 mg total) by mouth daily., Disp: 30 tablet, Rfl: 2 .  PARoxetine (PAXIL) 20 MG tablet, Take 1 tablet (20 mg total) by mouth daily., Disp: 30 tablet, Rfl: 5  Allergies  Allergen Reactions  . Azithromycin Diarrhea  . Peanuts [Peanut Oil] Swelling    "swelling of lips"   . Penicillins Hives and Swelling     ROS  Ten systems reviewed and is negative except as mentioned in HPI   Objective  Filed Vitals:   02/14/15 0914 02/14/15 0932  BP: 110/62   Pulse: 144 104  Temp: 98.1 F (36.7 C)   TempSrc: Oral   Resp: 16   Height: 5\' 8"  (1.727 m)   Weight: 162 lb (73.483 kg)   SpO2: 98%     Body mass index is 24.64 kg/(m^2).  Physical Exam  Constitutional: Patient appears well-developed and well-nourished. Obese  No distress.  HEENT: head atraumatic, normocephalic, pupils equal and reactive to light, neck  supple, throat within normal limits Cardiovascular: Normal rate, regular rhythm and normal heart sounds.  No murmur heard. No BLE edema. Pulmonary/Chest: Effort normal and breath sounds normal. No respiratory distress. Abdominal: Soft.  There is no tenderness. Psychiatric: Patient has a sad mood/crying a, behavior is normal. Judgment and thought content normal.  Recent Results (from the past 2160 hour(s))  CBC     Status: Abnormal   Collection Time: 01/30/15 10:43 AM  Result Value Ref Range   WBC 8.1 3.6 - 11.0 K/uL   RBC 4.61 3.80 - 5.20 MIL/uL   Hemoglobin 13.5 12.0 - 16.0 g/dL   HCT 16.1 09.6 - 04.5 %   MCV 86.3 80.0 - 100.0 fL   MCH 29.4 26.0 -  34.0 pg   MCHC 34.0 32.0 - 36.0 g/dL   RDW 96.0 (H) 45.4 - 09.8 %   Platelets 242 150 - 440 K/uL  Type and screen [facility]     Status: None   Collection Time: 01/30/15 10:43 AM  Result Value Ref Range   ABO/RH(D) A POS    Antibody Screen NEG    Sample Expiration 02/13/2015    Extend sample reason NO TRANSFUSIONS OR PREGNANCY IN THE PAST 3 MONTHS   ABO/Rh     Status: None   Collection Time: 01/30/15 10:44 AM  Result Value Ref Range   ABO/RH(D) A POS   Pregnancy, urine POC     Status: None   Collection Time: 02/02/15  6:35 AM  Result Value Ref Range   Preg Test, Ur NEGATIVE NEGATIVE    Comment:        THE SENSITIVITY OF THIS METHODOLOGY IS >24 mIU/mL   Surgical pathology     Status: None   Collection Time: 02/02/15  8:34 AM  Result Value Ref Range   SURGICAL PATHOLOGY      Surgical Pathology CASE: [id number] PATIENT: Lucrezia Europe Surgical Pathology Report     SPECIMEN SUBMITTED: A. Uterus, cervix, bilateral tubes  CLINICAL HISTORY: None provided  PRE-OPERATIVE DIAGNOSIS: Menorrhagia and anemia  POST-OPERATIVE DIAGNOSIS: None provided     DIAGNOSIS: A. UTERUS WITH CERVIX AND BILATERAL FALLOPIAN TUBES; HYSTERECTOMY WITH BILATERAL SALPINGECTOMY: - CERVIX WITHIN NORMAL LIMITS. -  PROLIFERATIVE ENDOMETRIUM. - BILATERAL FALLOPIAN TUBES WITHOUT PATHOLOGIC CHANGE.   GROSS DESCRIPTION:  A. The specimen is received in a formalin-filled container labeled with the patient's name and uterus with cervix, bilateral tubes.  Adnexa: bilateral tubes Weight: 184 grams Dimensions:      Fundus -6.3 x 6.2 x 4.8 cm      Cervix -4.7 x 3.7 cm Serosa: pink-tan with focal fibrous adhesion Cervix: smooth purple to pink Endocervix: trabecular pink-tan Endometrial cavity:      Dimensions -2.5 x 2.4 cm      Thickness -0.2 cm      Ot her findings -none noted Myometrium:     Thickness -2.5 cm     Other findings -none noted Adnexa:       Right fallopian tube           Measurements -7.3 cm in length x 1.5 cm in diameter           Other findings -fimbriated          Left fallopian tube            Measurements -7.6 cm in length x 1.7 cm in diameter           Other findings -fimbriated Other comments: none noted  Block summary:  1-representative anterior cervix with lower uterine segment 2-representative posterior cervix and lower uterine segment 3-representative anterior endomyometrium 4-representative posterior endomyometrium 5-representative cross-sections and longitudinal fimbriated end right fallopian tube 6-representative cross-sections and longitudinal fimbriated end left fallopian tube  Final Diagnosis performed by Glenice Bow, MD.  Electronically signed 02/03/2015 11:00:40AM    The electronic signature indicates that the named Attending Pathologist has evaluated the specimen  Tech nical component performed at [facility], [address], [place], [place] [address] Lab: [phone] Dir: Titus Dubin. Cato Mulligan, MD  Professional component performed at [facility], [facility], [address] [place], [place], [place] [address] Lab: [phone] Dir: Georgiann Cocker. Oneita Kras, MD    Hemoglobin     Status: Abnormal   Collection Time:  02/03/15  6:47 AM  Result Value Ref  Range   Hemoglobin 11.0 (L) 12.0 - 16.0 g/dL  CBC     Status: Abnormal   Collection Time: 02/08/15  1:33 PM  Result Value Ref Range   WBC 7.1 3.6 - 11.0 K/uL   RBC 4.41 3.80 - 5.20 MIL/uL   Hemoglobin 13.0 12.0 - 16.0 g/dL   HCT 16.1 09.6 - 04.5 %   MCV 86.5 80.0 - 100.0 fL   MCH 29.5 26.0 - 34.0 pg   MCHC 34.1 32.0 - 36.0 g/dL   RDW 40.9 (H) 81.1 - 91.4 %   Platelets 233 150 - 440 K/uL  Ferritin     Status: None   Collection Time: 02/08/15  1:33 PM  Result Value Ref Range   Ferritin 64 11 - 307 ng/mL  Iron and TIBC     Status: None   Collection Time: 02/08/15  1:33 PM  Result Value Ref Range   Iron 50 28 - 170 ug/dL   TIBC 782 956 - 213 ug/dL   Saturation Ratios 17 10.4 - 31.8 %   UIBC 237 ug/dL  Lactate dehydrogenase     Status: Abnormal   Collection Time: 02/08/15  1:33 PM  Result Value Ref Range   LDH 97 (L) 98 - 192 U/L  Erythropoietin     Status: None   Collection Time: 02/08/15  1:33 PM  Result Value Ref Range   Erythropoietin 11.1 2.6 - 18.5 mIU/mL    Comment: (NOTE) Performed At: [facility] [address] [place], [place] [id number] Mila Homer MD GE:[id number]   Vitamin B12     Status: None   Collection Time: 02/08/15  1:33 PM  Result Value Ref Range   Vitamin B-12 613 180 - 914 pg/mL    Comment: (NOTE) This assay is not validated for testing neonatal or myeloproliferative syndrome specimens for Vitamin B12 levels. Performed at [facility]   Haptoglobin     Status: None   Collection Time: 02/08/15  1:33 PM  Result Value Ref Range   Haptoglobin 123 34 - 200 mg/dL    Comment: (NOTE) Performed At: [facility] [address] [place], [place] [id number] Mila Homer MD UY:[id number]   Folic Acid     Status: None   Collection Time: 02/08/15  1:33 PM  Result Value Ref Range   Folate 13.4 >5.9 ng/mL  ANA w/Reflex     Status: None   Collection Time: 02/08/15  1:33 PM  Result Value Ref Range   Anit Nuclear  Antibody(ANA) Negative Negative    Comment: (NOTE) Performed At: [facility] [address] [place], [place] [id number] Mila Homer MD IR:[id number]     PHQ2/9: Depression screen [facility] 2/9 02/14/2015 11/22/2014 08/15/2014  Decreased Interest 3 0 0  Down, Depressed, Hopeless 1 0 0  PHQ - 2 Score 4 0 0  Altered sleeping 1 - -  Tired, decreased energy 0 - -  Change in appetite 1 - -  Feeling bad or failure about yourself  0 - -  Trouble concentrating 0 - -  Moving slowly or fidgety/restless 2 - -  Suicidal thoughts 0 - -  PHQ-9 Score 8 - -  Difficult doing work/chores Somewhat difficult - -     Fall Risk: Fall Risk  02/14/2015 11/22/2014 08/15/2014  Falls in the past year? No No Yes  Number falls in past yr: - - 1  Injury with Fall? - - Yes  Assessment & Plan  1. GAD (generalized anxiety disorder)  - PARoxetine (PAXIL) 20 MG tablet; Take 1 tablet (20 mg total) by mouth daily.  Dispense: 30 tablet; Refill: 5 - ALPRAZolam (XANAX) 0.5 MG tablet; Take 1 tablet (0.5 mg total) by mouth at bedtime as needed for anxiety.  Dispense: 30 tablet; Refill: 0 - ALPRAZolam (XANAX XR) 1 MG 24 hr tablet; Take 1 tablet (1 mg total) by mouth daily.  Dispense: 30 tablet; Refill: 2  2. Grieving  Still grieving  3. Hypertriglyceridemia  On diet only   4. Tachycardia  Partially from anxiety, seen by cardiologist in the past, discussed metoprolol and calcium channel blocker, but because bp being low it is on hold for now  5. Insomnia, persistent  - zolpidem (AMBIEN) 10 MG tablet; Take 1 tablet (10 mg total) by mouth at bedtime as needed.  Dispense: 30 tablet; Refill: 5  6. Neck pain  - cyclobenzaprine (FLEXERIL) 10 MG tablet; Take 1 tablet (10 mg total) by mouth daily.  Dispense: 30 tablet; Refill: 5  7. Gastric reflux  - pantoprazole (PROTONIX) 40 MG tablet; Take 1 tablet (40 mg total) by mouth daily.  Dispense: 30 tablet; Refill: 5  8. Iron deficiency anemia due to  chronic blood loss   9. History of hysterectomy

## 2015-03-17 ENCOUNTER — Ambulatory Visit: Payer: BLUE CROSS/BLUE SHIELD | Admitting: Family Medicine

## 2015-05-05 ENCOUNTER — Encounter: Payer: Self-pay | Admitting: Family Medicine

## 2015-05-05 ENCOUNTER — Ambulatory Visit (INDEPENDENT_AMBULATORY_CARE_PROVIDER_SITE_OTHER): Payer: BLUE CROSS/BLUE SHIELD | Admitting: Family Medicine

## 2015-05-05 VITALS — BP 116/80 | HR 122 | Temp 98.2°F | Resp 18 | Wt 151.8 lb

## 2015-05-05 DIAGNOSIS — R000 Tachycardia, unspecified: Secondary | ICD-10-CM

## 2015-05-05 DIAGNOSIS — Z79899 Other long term (current) drug therapy: Secondary | ICD-10-CM

## 2015-05-05 DIAGNOSIS — D500 Iron deficiency anemia secondary to blood loss (chronic): Secondary | ICD-10-CM

## 2015-05-05 DIAGNOSIS — G4700 Insomnia, unspecified: Secondary | ICD-10-CM

## 2015-05-05 DIAGNOSIS — E559 Vitamin D deficiency, unspecified: Secondary | ICD-10-CM

## 2015-05-05 DIAGNOSIS — F4321 Adjustment disorder with depressed mood: Secondary | ICD-10-CM

## 2015-05-05 DIAGNOSIS — E781 Pure hyperglyceridemia: Secondary | ICD-10-CM | POA: Diagnosis not present

## 2015-05-05 DIAGNOSIS — R739 Hyperglycemia, unspecified: Secondary | ICD-10-CM

## 2015-05-05 DIAGNOSIS — F411 Generalized anxiety disorder: Secondary | ICD-10-CM | POA: Diagnosis not present

## 2015-05-05 MED ORDER — ALPRAZOLAM ER 1 MG PO TB24: 1.0000 mg | ORAL_TABLET | Freq: Every day | ORAL | Status: DC

## 2015-05-05 MED ORDER — ALPRAZOLAM 0.5 MG PO TABS: 0.5000 mg | ORAL_TABLET | Freq: Every evening | ORAL | Status: DC | PRN

## 2015-05-05 NOTE — Addendum Note (Signed)
Addended by: [name], Lekita Kerekes F on: 05/05/2015 09:18 AM   Modules accepted: Orders

## 2015-05-05 NOTE — Progress Notes (Addendum)
Name: Alicia [name]   MRN: [id number]    DOB: [birth year]   Date:05/05/2015       Progress Note  Subjective  Chief Complaint  Chief Complaint  Patient presents with  . Anxiety    when asked if things are going well, she responded, "things are going."    HPI  Grieving /stress : Husband died suddenly of a brain aneurysm on October 23 red, 2016. She has been taking her antidepressant medication and Alprazolam as prescribed, she is still struggling, still having agoraphobia ( afraid of meeting people - and the ones that do not know that husband died). She is taking Alprazolam XR and has noticed decrease in panic attack, down to twice weekly. Family involvement has helped her ( two nieces and mother and father, plus sister-in-law ). Kids are doing okay, 45 yo and the oldest son are struggling the most.   Iron deficiency anemia: going on for many years, however worse since in 2013 when she had a blood transfusion in 01/2012 while pregnant with surrogate baby. She was getting iron infusion because she could not tolerate oral supplements, she had a hysterectomy Dec 15 th, 2016, and iron infusion was given shortly after hysterectomy. Seeing Dr. Orlie DakinFinnegan.   Hypertriglyceridemia: on diet only, avoiding fried food.   Hyperglycemia: not eating much, because of lack of appetite, she denies polyphagia, polydipsia or polyuria.   Insomnia: taking Ambien and is taking her less than 30 minutes to fall asleep    Patient Active Problem List   Diagnosis Date Noted  . GAD (generalized anxiety disorder) 02/14/2015  . Neck pain 02/14/2015  . History of hysterectomy 02/14/2015  . S/P hysterectomy 02/02/2015  . Grieving 12/16/2014  . Acute stress reaction 12/16/2014  . Iron deficiency anemia due to chronic blood loss 08/29/2014  . Insomnia, persistent 08/14/2014  . Major depression (HCC) 08/14/2014  . Temporomandibular joint sounds on opening and/or closing the jaw 08/14/2014  . Degenerative disc  disease, lumbar 08/14/2014  . Bleeding internal hemorrhoids 08/14/2014  . Gastric reflux 08/14/2014  . Blood glucose elevated 08/14/2014  . Irritable bowel syndrome with constipation 08/14/2014  . Hypertriglyceridemia 08/14/2014  . Overweight 08/14/2014  . Restless legs syndrome 08/14/2014  . Tinnitus 08/14/2014  . Vitamin D deficiency 08/14/2014  . Tachycardia 11/25/2012  . DOE (dyspnea on exertion) 11/06/2012  . Previous cesarean delivery, delivered, with or without mention of antepartum condition 01/19/2012    Past Surgical History  Procedure Laterality Date  . Cesarean section  2011  . Knee surgery Right 2006    Knee Arthroscopy  . Dilation and curettage of uterus  2003, 2005, 2008  . Breast surgery Bilateral December 2015    Breast Reduction  . Hernia repair  1999    Umbilical  . Abdominoplasty  Feb. 2015  . Laparoscopic hysterectomy N/A 02/02/2015    Procedure: HYSTERECTOMY TOTAL LAPAROSCOPIC;  Surgeon: Nadara Mustardobert P Harris, MD;  Location: [facility];  Service: Gynecology;  Laterality: N/A;  . Laparoscopic bilateral salpingectomy Bilateral 02/02/2015    Procedure: LAPAROSCOPIC BILATERAL SALPINGECTOMY;  Surgeon: Nadara Mustardobert P Harris, MD;  Location: [facility];  Service: Gynecology;  Laterality: Bilateral;  . Cystoscopy  02/02/2015    Procedure: CYSTOSCOPY;  Surgeon: Nadara Mustardobert P Harris, MD;  Location: [facility];  Service: Gynecology;;    Family History  Problem Relation Age of Onset  . Hypertension Mother   . Hyperlipidemia Mother   . Heart Problems Father     hole in heart  . Prostate cancer  Maternal Grandfather     Social History   Social History  . Marital Status: Widowed    Spouse Name: N/A  . Number of Children: 4  . 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 Smoking status: Never Smoker   . Smokeless tobacco: Never Used  . Alcohol Use: No  . Drug Use: No  . Sexual Activity:    Partners: Male    Other Topics Concern  . Not on file   Social History Narrative     Current outpatient prescriptions:  .  ALPRAZolam (XANAX XR) 1 MG 24 hr tablet, Take 1 tablet (1 mg total) by mouth daily., Disp: 30 tablet, Rfl: 2 .  ALPRAZolam (XANAX) 0.5 MG tablet, Take 1 tablet (0.5 mg total) by mouth at bedtime as needed for anxiety., Disp: 30 tablet, Rfl: 0 .  cyclobenzaprine (FLEXERIL) 10 MG tablet, Take 1 tablet (10 mg total) by mouth daily., Disp: 30 tablet, Rfl: 5 .  Multiple Vitamins-Minerals (CENTRUM PO), Take 1 tablet by mouth daily. Reported on 02/02/2015, Disp: , Rfl:  .  pantoprazole (PROTONIX) 40 MG tablet, Take 1 tablet (40 mg total) by mouth daily., Disp: 30 tablet, Rfl: 5 .  PARoxetine (PAXIL) 20 MG tablet, Take 1 tablet (20 mg total) by mouth daily., Disp: 30 tablet, Rfl: 5 .  zolpidem (AMBIEN) 10 MG tablet, Take 1 tablet (10 mg total) by mouth at bedtime as needed., Disp: 30 tablet, Rfl: 5  Allergies  Allergen Reactions  . Azithromycin Diarrhea  . Peanuts [Peanut Oil] Swelling    "swelling of lips"   . Penicillins Hives and Swelling     ROS  Constitutional: Negative for fever or weight change.  Respiratory: Negative for cough and shortness of breath.   Cardiovascular: Negative for chest pain or palpitations ( only with panic attack).  Gastrointestinal: Negative for abdominal pain, positive  bowel changes ( improved since hysterectomy, no longer constipated).  Musculoskeletal: Negative for gait problem or joint swelling.  Skin: Negative for rash.  Neurological: Negative for dizziness or headache.  No other specific complaints in a complete review of systems (except as listed in HPI above).  Objective  Filed Vitals:   05/05/15 0825  BP: 116/80  Pulse: 122  Temp: 98.2 F (36.8 C)  TempSrc: Oral  Resp: 18  Weight: 151 lb 12.8 oz (68.856 kg)  SpO2: 98%    Body mass index is 23.09 kg/(m^2).  Physical Exam  Constitutional: Patient appears well-developed and  well-nourished. Obese  No distress.  HEENT: head atraumatic, normocephalic, pupils equal and reactive to light, neck supple, throat within normal limits Cardiovascular: Normal rate, regular rhythm and normal heart sounds.  No murmur heard. No BLE edema. Pulmonary/Chest: Effort normal and breath sounds normal. No respiratory distress. Abdominal: Soft.  There is no tenderness. Psychiatric: Patient has a normal mood and affect. behavior is normal. Judgment and thought content normal.  Recent Results (from the past 2160 hour(s))  CBC     Status: Abnormal   Collection Time: 02/08/15  1:33 PM  Result Value Ref Range   WBC 7.1 3.6 - 11.0 K/uL   RBC 4.41 3.80 - 5.20 MIL/uL   Hemoglobin 13.0 12.0 - 16.0 g/dL   HCT 96.0 45.4 - 09.8 %   MCV 86.5 80.0 - 100.0 fL   MCH 29.5 26.0 - 34.0 pg   MCHC 34.1 32.0 - 36.0 g/dL   RDW 11.9 (H) 14.7 -  14.5 %   Platelets 233 150 - 440 K/uL  Ferritin     Status: None   Collection Time: 02/08/15  1:33 PM  Result Value Ref Range   Ferritin 64 11 - 307 ng/mL  Iron and TIBC     Status: None   Collection Time: 02/08/15  1:33 PM  Result Value Ref Range   Iron 50 28 - 170 ug/dL   TIBC 409 811 - 914 ug/dL   Saturation Ratios 17 10.4 - 31.8 %   UIBC 237 ug/dL  Lactate dehydrogenase     Status: Abnormal   Collection Time: 02/08/15  1:33 PM  Result Value Ref Range   LDH 97 (L) 98 - 192 U/L  Erythropoietin     Status: None   Collection Time: 02/08/15  1:33 PM  Result Value Ref Range   Erythropoietin 11.1 2.6 - 18.5 mIU/mL    Comment: (NOTE) Performed At: [facility] [address] [place], [place] [id number] Mila Homer MD [id number]   Vitamin B12     Status: None   Collection Time: 02/08/15  1:33 PM  Result Value Ref Range   Vitamin B-12 613 180 - 914 pg/mL    Comment: (NOTE) This assay is not validated for testing neonatal or myeloproliferative syndrome specimens for Vitamin B12 levels. Performed at [facility]   Haptoglobin      Status: None   Collection Time: 02/08/15  1:33 PM  Result Value Ref Range   Haptoglobin 123 34 - 200 mg/dL    Comment: (NOTE) Performed At: [facility] [address] [place], [place] [id number] Mila Homer MD [id number]:[id number]   Folic Acid     Status: None   Collection Time: 02/08/15  1:33 PM  Result Value Ref Range   Folate 13.4 >5.9 ng/mL  ANA w/Reflex     Status: None   Collection Time: 02/08/15  1:33 PM  Result Value Ref Range   Anit Nuclear Antibody(ANA) Negative Negative    Comment: (NOTE) Performed At: [facility] [address] [place], [place] [id number] Mila Homer MD [id number]      PHQ2/9: Depression screen [facility] 2/9 05/05/2015 02/14/2015 11/22/2014 08/15/2014  Decreased Interest 0 3 0 0  Down, Depressed, Hopeless 0 1 0 0  PHQ - 2 Score 0 4 0 0  Altered sleeping - 1 - -  Tired, decreased energy - 0 - -  Change in appetite - 1 - -  Feeling bad or failure about yourself  - 0 - -  Trouble concentrating - 0 - -  Moving slowly or fidgety/restless - 2 - -  Suicidal thoughts - 0 - -  PHQ-9 Score - 8 - -  Difficult doing work/chores - Somewhat difficult - -     Fall Risk: Fall Risk  05/05/2015 02/14/2015 11/22/2014 08/15/2014  Falls in the past year? No No No Yes  Number falls in past yr: - - - 1  Injury with Fall? - - - Yes     Functional Status Survey: Is the patient deaf or have difficulty hearing?: No Does the patient have difficulty seeing, even when wearing glasses/contacts?: No Does the patient have difficulty concentrating, remembering, or making decisions?: No Does the patient have difficulty walking or climbing stairs?: No Does the patient have difficulty dressing or bathing?: No Does the patient have difficulty doing errands alone such as visiting a doctor's office or shopping?: No    Assessment & Plan  1. GAD (generalized anxiety  disorder)  - ALPRAZolam (XANAX XR) 1 MG 24 hr tablet; Take 1 tablet (1 mg total) by  mouth daily.  Dispense: 30 tablet; Refill: 2 - ALPRAZolam (XANAX) 0.5 MG tablet; Take 1 tablet (0.5 mg total) by mouth at bedtime as needed for anxiety.  Dispense: 45 tablet; Refill: 0  2. Grieving  Not crying during the visit today  3. Hypertriglyceridemia  - Lipid panel  4. Insomnia, persistent  Continue medication   6. Iron deficiency anemia due to chronic blood loss  - CBC with Differential/Platelet - Ferritin  7. Vitamin D deficiency  - VITAMIN D 25 Hydroxy (Vit-D Deficiency, Fractures)  8. Hyperglycemia  - Hemoglobin A1c  9. Encounter for long-term (current) use of medications  - Comprehensive metabolic panel   9. Tachycardia  - TSH

## 2015-06-08 ENCOUNTER — Inpatient Hospital Stay: Payer: BLUE CROSS/BLUE SHIELD | Attending: Oncology

## 2015-06-08 ENCOUNTER — Inpatient Hospital Stay: Payer: BLUE CROSS/BLUE SHIELD

## 2015-06-08 ENCOUNTER — Inpatient Hospital Stay: Payer: BLUE CROSS/BLUE SHIELD | Admitting: Oncology

## 2015-06-12 ENCOUNTER — Other Ambulatory Visit: Payer: Self-pay

## 2015-06-12 DIAGNOSIS — F411 Generalized anxiety disorder: Secondary | ICD-10-CM

## 2015-06-12 NOTE — Telephone Encounter (Signed)
[facility] requesting a refill of this patient's Alprazolam XR 1mg .  Refill request was sent to Dr. Alba CoryKrichna Sowles for approval and submission.

## 2015-06-15 ENCOUNTER — Other Ambulatory Visit: Payer: Self-pay

## 2015-06-15 DIAGNOSIS — F411 Generalized anxiety disorder: Secondary | ICD-10-CM

## 2015-06-15 NOTE — Telephone Encounter (Signed)
Patient requesting refill. 

## 2015-06-15 NOTE — Telephone Encounter (Signed)
Patient stated that she lost her medication and is asking if we can send it to [facility] instead.  Pharmacies never filled the rx. Confirmed by both pharmacies [facility] [facility]  Refill request was sent to Dr. Alba CoryKrichna Sowles for approval and submission.

## 2015-06-15 NOTE — Telephone Encounter (Signed)
Patient called back and stated that her son has found her RX so she does not need a refill.

## 2015-07-31 ENCOUNTER — Ambulatory Visit (INDEPENDENT_AMBULATORY_CARE_PROVIDER_SITE_OTHER): Payer: BLUE CROSS/BLUE SHIELD | Admitting: Family Medicine

## 2015-07-31 ENCOUNTER — Encounter: Payer: Self-pay | Admitting: Family Medicine

## 2015-07-31 VITALS — BP 98/62 | HR 122 | Temp 98.2°F | Resp 18 | Ht 68.0 in | Wt 157.8 lb

## 2015-07-31 DIAGNOSIS — K219 Gastro-esophageal reflux disease without esophagitis: Secondary | ICD-10-CM | POA: Diagnosis not present

## 2015-07-31 DIAGNOSIS — G4700 Insomnia, unspecified: Secondary | ICD-10-CM

## 2015-07-31 DIAGNOSIS — E781 Pure hyperglyceridemia: Secondary | ICD-10-CM

## 2015-07-31 DIAGNOSIS — R739 Hyperglycemia, unspecified: Secondary | ICD-10-CM | POA: Diagnosis not present

## 2015-07-31 DIAGNOSIS — M542 Cervicalgia: Secondary | ICD-10-CM

## 2015-07-31 DIAGNOSIS — F411 Generalized anxiety disorder: Secondary | ICD-10-CM

## 2015-07-31 DIAGNOSIS — K581 Irritable bowel syndrome with constipation: Secondary | ICD-10-CM | POA: Diagnosis not present

## 2015-07-31 DIAGNOSIS — R000 Tachycardia, unspecified: Secondary | ICD-10-CM | POA: Diagnosis not present

## 2015-07-31 MED ORDER — ZOLPIDEM TARTRATE 10 MG PO TABS: 10.0000 mg | ORAL_TABLET | Freq: Every evening | ORAL | Status: DC | PRN

## 2015-07-31 MED ORDER — PAROXETINE HCL 20 MG PO TABS: 20.0000 mg | ORAL_TABLET | Freq: Every day | ORAL | Status: DC

## 2015-07-31 MED ORDER — RANITIDINE HCL 300 MG PO TABS: 150.0000 mg | ORAL_TABLET | Freq: Two times a day (BID) | ORAL | Status: DC

## 2015-07-31 MED ORDER — ALPRAZOLAM XR 1 MG PO TB24: 1.0000 mg | ORAL_TABLET | Freq: Every day | ORAL | Status: DC

## 2015-07-31 MED ORDER — CYCLOBENZAPRINE HCL 10 MG PO TABS: 10.0000 mg | ORAL_TABLET | Freq: Every day | ORAL | Status: DC

## 2015-07-31 MED ORDER — ALPRAZOLAM 0.5 MG PO TABS: 0.5000 mg | ORAL_TABLET | Freq: Every evening | ORAL | Status: DC | PRN

## 2015-07-31 MED ORDER — PANTOPRAZOLE SODIUM 40 MG PO TBEC: 40.0000 mg | DELAYED_RELEASE_TABLET | Freq: Every day | ORAL | Status: DC
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
## 2015-08-09 ENCOUNTER — Ambulatory Visit: Payer: BLUE CROSS/BLUE SHIELD | Admitting: Family Medicine

## 2015-08-10 LAB — HEMOGLOBIN A1C: Hemoglobin A1C: 5.47

## 2015-08-10 LAB — LIPID PANEL
Cholesterol: 204 mg/dL — AB (ref 0–200)
HDL: 42 mg/dL (ref 35–70)
LDL Cholesterol: 136 mg/dL
Triglycerides: 130 mg/dL (ref 40–160)

## 2015-08-10 LAB — BASIC METABOLIC PANEL: GLUCOSE: 106 mg/dL

## 2015-08-15 ENCOUNTER — Encounter: Payer: Self-pay | Admitting: Family Medicine

## 2015-11-01 ENCOUNTER — Encounter: Payer: Self-pay | Admitting: Family Medicine

## 2015-11-01 ENCOUNTER — Ambulatory Visit (INDEPENDENT_AMBULATORY_CARE_PROVIDER_SITE_OTHER): Payer: BLUE CROSS/BLUE SHIELD | Admitting: Family Medicine

## 2015-11-01 VITALS — BP 102/62 | HR 127 | Temp 98.5°F | Resp 18 | Ht 68.0 in | Wt 163.4 lb

## 2015-11-01 DIAGNOSIS — K581 Irritable bowel syndrome with constipation: Secondary | ICD-10-CM

## 2015-11-01 DIAGNOSIS — R739 Hyperglycemia, unspecified: Secondary | ICD-10-CM

## 2015-11-01 DIAGNOSIS — M542 Cervicalgia: Secondary | ICD-10-CM | POA: Diagnosis not present

## 2015-11-01 DIAGNOSIS — F411 Generalized anxiety disorder: Secondary | ICD-10-CM

## 2015-11-01 DIAGNOSIS — R000 Tachycardia, unspecified: Secondary | ICD-10-CM | POA: Diagnosis not present

## 2015-11-01 DIAGNOSIS — E781 Pure hyperglyceridemia: Secondary | ICD-10-CM

## 2015-11-01 DIAGNOSIS — E559 Vitamin D deficiency, unspecified: Secondary | ICD-10-CM

## 2015-11-01 DIAGNOSIS — G8929 Other chronic pain: Secondary | ICD-10-CM

## 2015-11-01 DIAGNOSIS — G4700 Insomnia, unspecified: Secondary | ICD-10-CM | POA: Diagnosis not present

## 2015-11-01 DIAGNOSIS — K219 Gastro-esophageal reflux disease without esophagitis: Secondary | ICD-10-CM

## 2015-11-01 DIAGNOSIS — Z23 Encounter for immunization: Secondary | ICD-10-CM | POA: Diagnosis not present

## 2015-11-01 MED ORDER — PAROXETINE HCL 40 MG PO TABS: 20.0000 mg | ORAL_TABLET | Freq: Every day | ORAL | 2 refills | Status: DC

## 2015-11-01 MED ORDER — ALPRAZOLAM XR 1 MG PO TB24: 1.0000 mg | ORAL_TABLET | Freq: Every day | ORAL | 2 refills | Status: DC

## 2015-11-01 MED ORDER — ATENOLOL 25 MG PO TABS: 12.5000 mg | ORAL_TABLET | Freq: Every day | ORAL | 1 refills | Status: DC

## 2015-11-01 MED ORDER — ALPRAZOLAM 0.5 MG PO TABS: 0.5000 mg | ORAL_TABLET | Freq: Every evening | ORAL | 0 refills | Status: DC | PRN

## 2015-11-01 NOTE — Progress Notes (Signed)
Name: Alicia [name]   MRN: [id number]    DOB: 1970/12/17   Date:11/01/2015       Progress Note  Subjective  Chief Complaint  Chief Complaint  Patient presents with  . Anxiety    pt here for 3 month follow up    HPI  Grieving /GAD : Husband died suddenly of a brain aneurysm on October 23 rd, 2016. She has been taking her antidepressant medication and Alprazolam as prescribed, she is still struggling, still having agoraphobia ( afraid of meeting people - and the ones that do not know that husband died). She is taking Alprazolam XR and has noticed decrease in panic attack, however over the past month she has noticed more stress - her older son had a child and makes her miss her husband more, also the anniversary of his death is coming around the corner, middle son trying to join the army. Discussed the need to increase Paxil instead of increasing amount of xanax 0.5 mg, she agrees.   Iron deficiency anemia: going on for many years, however worse since in 2013 when she had a blood transfusion in 01/2012 while pregnant with surrogate baby. She was getting iron infusion because she could not tolerate oral supplements, she had a hysterectomy Dec 15 th, 2016, and iron infusion was given shortly after hysterectomy. Last labs done in 07/2015 and ferritin and hgb were back to normal, no longer bleeding and that was the cause of her anemia  Hypertriglyceridemia: on diet only, avoiding fried food, eating a lot of peanut butter recently and has gained weight. LDL slightly up now, but HDL at goal.   Hyperglycemia: she has gained weight since last visit, she is having more of an appetite now, still not eating healthy all the time, but is snacking when hungry, she denies polyphagia, polydipsia or polyuria. Not eating regular meals, husband was the cook, kids eat at her parents house.   Insomnia: taking Ambien and is taking her less than 30 minutes to fall asleep . Able to stay asleep for at least 6  hours, wakes up because of her dogs, she can't fall asleep after that, but feels rested . Sleeping better at the new house.   Neck spasms: she takes Flexeril in the afternoon to improve her neck spasms.   IBS: doing better, since she has hysterectomy, she still has narrow stools, no blood or mucus in stools. Seen by Dr. Shelle Iron previously. Normal thyroid panel in 07/2014  GERD: taking Pantoprazole, under control with medication, no heartburn or regurgitation. She states she can't sleep if she does not take it . Symptoms are worse at night, we discussed possible side effects of PPI and she is trying to wean self off  Tachycardia: seen by Cardiologist in the past, they discussed  Starting on  beta-blocker but because low bp it was decided to stay as it is.    Patient Active Problem List   Diagnosis Date Noted  . GAD (generalized anxiety disorder) 02/14/2015  . Neck pain 02/14/2015  . History of hysterectomy 02/14/2015  . S/P hysterectomy 02/02/2015  . Grieving 12/16/2014  . Acute stress reaction 12/16/2014  . Iron deficiency anemia due to chronic blood loss 08/29/2014  . Insomnia, persistent 08/14/2014  . Major depression (HCC) 08/14/2014  . Temporomandibular joint sounds on opening and/or closing the jaw 08/14/2014  . Degenerative disc disease, lumbar 08/14/2014  . Bleeding internal hemorrhoids 08/14/2014  . Gastric reflux 08/14/2014  . Blood glucose elevated 08/14/2014  .  Irritable bowel syndrome with constipation 08/14/2014  . Hypertriglyceridemia 08/14/2014  . Overweight 08/14/2014  . Restless legs syndrome 08/14/2014  . Tinnitus 08/14/2014  . Vitamin D deficiency 08/14/2014  . Tachycardia 11/25/2012  . DOE (dyspnea on exertion) 11/06/2012  . Previous cesarean delivery, delivered, with or without mention of antepartum condition 01/19/2012    Past Surgical History:  Procedure Laterality Date  . ABDOMINOPLASTY  Feb. 2015  . BREAST SURGERY Bilateral December 2015   Breast  Reduction  . CESAREAN SECTION  2011  . CYSTOSCOPY  02/02/2015   Procedure: CYSTOSCOPY;  Surgeon: Nadara Mustard, MD;  Location: [facility] ORS;  Service: Gynecology;;  . Joya Gaskins AND CURETTAGE OF UTERUS  2003, 2005, 2008  . HERNIA REPAIR  1999   Umbilical  . KNEE SURGERY Right 2006   Knee Arthroscopy  . LAPAROSCOPIC BILATERAL SALPINGECTOMY Bilateral 02/02/2015   Procedure: LAPAROSCOPIC BILATERAL SALPINGECTOMY;  Surgeon: Nadara Mustard, MD;  Location: [facility] ORS;  Service: Gynecology;  Laterality: Bilateral;  . LAPAROSCOPIC HYSTERECTOMY N/A 02/02/2015   Procedure: HYSTERECTOMY TOTAL LAPAROSCOPIC;  Surgeon: Nadara Mustard, MD;  Location: [facility] ORS;  Service: Gynecology;  Laterality: N/A;    Family History  Problem Relation Age of Onset  . Hypertension Mother   . Hyperlipidemia Mother   . Heart Problems Father     hole in heart  . Prostate cancer Maternal Grandfather     Social History   Social History  . Marital status: Widowed    Spouse name: N/A  . Number of children: 4  . 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 Smoking status: Never Smoker  . Smokeless tobacco: Never Used  . Alcohol use No  . Drug use: No  . Sexual activity: Yes    Partners: Male   Other Topics Concern  . Not on file   Social History Narrative  . No narrative on file     Current Outpatient Prescriptions:  .  ALPRAZolam (XANAX) 0.5 MG tablet, Take 1 tablet (0.5 mg total) by mouth at bedtime as needed for anxiety., Disp: 45 tablet, Rfl: 0 .  ALPRAZOLAM XR 1 MG 24 hr tablet, Take 1 tablet (1 mg total) by mouth daily., Disp: 30 tablet, Rfl: 2 .  cyclobenzaprine (FLEXERIL) 10 MG tablet, Take 1 tablet (10 mg total) by mouth daily., Disp: 30 tablet, Rfl: 5 .  Multiple Vitamins-Minerals (CENTRUM PO), Take 1 tablet by mouth daily. Reported on 02/02/2015, Disp: , Rfl:  .  pantoprazole (PROTONIX) 40 MG tablet, Take 1 tablet (40 mg total) by  mouth daily., Disp: 30 tablet, Rfl: 5 .  PARoxetine (PAXIL) 40 MG tablet, Take 0.5 tablets (20 mg total) by mouth daily., Disp: 30 tablet, Rfl: 2 .  ranitidine (ZANTAC) 300 MG tablet, Take 0.5 tablets (150 mg total) by mouth 2 (two) times daily., Disp: 60 tablet, Rfl: 5 .  zolpidem (AMBIEN) 10 MG tablet, Take 1 tablet (10 mg total) by mouth at bedtime as needed., Disp: 30 tablet, Rfl: 5  Allergies  Allergen Reactions  . Azithromycin Diarrhea  . Peanuts [Peanut Oil] Swelling    "swelling of lips"   . Penicillins Hives and Swelling     ROS  Constitutional: Negative for fever , positive for weight change.  Respiratory: Negative for cough and shortness of breath.   Cardiovascular: Negative for chest pain or palpitations.  Gastrointestinal: Negative for abdominal pain, no bowel changes.  Musculoskeletal: Negative for  gait problem or joint swelling.  Skin: Negative for rash.  Neurological: Negative for dizziness or headache.  No other specific complaints in a complete review of systems (except as listed in HPI above).  Objective  Vitals:   11/01/15 0809  BP: 102/62  Pulse: (!) 127  Resp: 18  Temp: 98.5 F (36.9 C)  SpO2: 98%  Weight: 163 lb 7 oz (74.1 kg)  Height: 5\' 8"  (1.727 m)    Body mass index is 24.85 kg/m.  Physical Exam  Constitutional: Patient appears well-developed and well-nourished.  No distress.  HEENT: head atraumatic, normocephalic, pupils equal and reactive to light,  neck supple, throat within normal limits Cardiovascular: Normal rate, regular rhythm and normal heart sounds.  No murmur heard. No BLE edema. Pulmonary/Chest: Effort normal and breath sounds normal. No respiratory distress. Abdominal: Soft.  There is no tenderness. Psychiatric: Patient has a normal mood and affect. behavior is normal. Judgment and thought content normal.   Recent Results (from the past 2160 hour(s))  Basic metabolic panel     Status: None   Collection Time: 08/10/15 12:00  AM  Result Value Ref Range   Glucose 106 mg/dL  Lipid panel     Status: Abnormal   Collection Time: 08/10/15 12:00 AM  Result Value Ref Range   Triglycerides 130 40 - 160 mg/dL   Cholesterol 161204 (A) 0 - 200 mg/dL   HDL 42 35 - 70 mg/dL   LDL Cholesterol 096136 mg/dL  Hemoglobin E4VA1c     Status: None   Collection Time: 08/10/15 12:00 AM  Result Value Ref Range   Hemoglobin A1C 5.47       PHQ2/9: Depression screen [facility] 11/01/2015 07/31/2015 05/05/2015 02/14/2015 11/22/2014  Decreased Interest 0 2 0 3 0  Down, Depressed, Hopeless 0 1 0 1 0  PHQ - 2 Score 0 3 0 4 0  Altered sleeping - 2 - 1 -  Tired, decreased energy - 0 - 0 -  Change in appetite - 0 - 1 -  Feeling bad or failure about yourself  - 0 - 0 -  Trouble concentrating - 0 - 0 -  Moving slowly or fidgety/restless - 0 - 2 -  Suicidal thoughts - 0 - 0 -  PHQ-9 Score - 5 - 8 -  Difficult doing work/chores - Somewhat difficult - Somewhat difficult -     Fall Risk: Fall Risk  11/01/2015 07/31/2015 05/05/2015 02/14/2015 11/22/2014  Falls in the past year? Yes No No No No  Number falls in past yr: 2 or more - - - -  Injury with Fall? No - - - -     Functional Status Survey: Is the patient deaf or have difficulty hearing?: No Does the patient have difficulty seeing, even when wearing glasses/contacts?: No Does the patient have difficulty concentrating, remembering, or making decisions?: No Does the patient have difficulty walking or climbing stairs?: No Does the patient have difficulty dressing or bathing?: No Does the patient have difficulty doing errands alone such as visiting a doctor's office or shopping?: No    Assessment & Plan  1. GAD (generalized anxiety disorder)  Discussed risk of long term use of BZD's, we will increase paxil - ALPRAZOLAM XR 1 MG 24 hr tablet; Take 1 tablet (1 mg total) by mouth daily.  Dispense: 30 tablet; Refill: 2 - PARoxetine (PAXIL) 40 MG tablet; Take 0.5 tablets (20 mg total) by mouth  daily.  Dispense: 30 tablet; Refill: 2 - ALPRAZolam Prudy Feeler(XANAX)  0.5 MG tablet; Take 1 tablet (0.5 mg total) by mouth at bedtime as needed for anxiety.  Dispense: 45 tablet; Refill: 0  2. Hypertriglyceridemia  Discussed life style changes  3. Insomnia, persistent  Continue medication   4. Hyperglycemia  Last hgbA1C was 5.4%  5. Irritable bowel syndrome with constipation    6. Gastric reflux  Try to wean off Pantoprazole  7. Vitamin D deficiency  Continue supplementation   8. Chronic neck pain  Doing okay with Flexeril   9. Needs flu shot  - Flu Vaccine QUAD 36+ mos IM  10. Tachycardia  Discussed possible side effects, take first dose on a Friday night, stay hydrated and monitor for orthostatic changes - atenolol (TENORMIN) 25 MG tablet; Take 0.5-1 tablets (12.5-25 mg total) by mouth daily.  Dispense: 30 tablet; Refill: 1

## 2016-01-19 DIAGNOSIS — R569 Unspecified convulsions: Secondary | ICD-10-CM | POA: Diagnosis not present

## 2016-01-20 ENCOUNTER — Emergency Department (HOSPITAL_COMMUNITY): Payer: BLUE CROSS/BLUE SHIELD

## 2016-01-20 ENCOUNTER — Other Ambulatory Visit: Payer: Self-pay | Admitting: Family Medicine

## 2016-01-20 ENCOUNTER — Encounter (HOSPITAL_COMMUNITY): Payer: Self-pay | Admitting: Emergency Medicine

## 2016-01-20 ENCOUNTER — Telehealth: Payer: Self-pay | Admitting: Family Medicine

## 2016-01-20 ENCOUNTER — Observation Stay (HOSPITAL_COMMUNITY)
Admission: EM | Admit: 2016-01-20 | Discharge: 2016-01-21 | Disposition: A | Discharge status: Home / Self Care | Payer: BLUE CROSS/BLUE SHIELD | Attending: Internal Medicine | Admitting: Internal Medicine

## 2016-01-20 ENCOUNTER — Observation Stay (HOSPITAL_COMMUNITY): Payer: BLUE CROSS/BLUE SHIELD

## 2016-01-20 DIAGNOSIS — K219 Gastro-esophageal reflux disease without esophagitis: Secondary | ICD-10-CM | POA: Diagnosis not present

## 2016-01-20 DIAGNOSIS — Z881 Allergy status to other antibiotic agents status: Secondary | ICD-10-CM | POA: Insufficient documentation

## 2016-01-20 DIAGNOSIS — Z90710 Acquired absence of both cervix and uterus: Secondary | ICD-10-CM | POA: Diagnosis not present

## 2016-01-20 DIAGNOSIS — G2581 Restless legs syndrome: Secondary | ICD-10-CM

## 2016-01-20 DIAGNOSIS — R29818 Other symptoms and signs involving the nervous system: Secondary | ICD-10-CM | POA: Diagnosis not present

## 2016-01-20 DIAGNOSIS — D72829 Elevated white blood cell count, unspecified: Secondary | ICD-10-CM | POA: Diagnosis not present

## 2016-01-20 DIAGNOSIS — R000 Tachycardia, unspecified: Secondary | ICD-10-CM | POA: Insufficient documentation

## 2016-01-20 DIAGNOSIS — R569 Unspecified convulsions: Secondary | ICD-10-CM | POA: Diagnosis not present

## 2016-01-20 DIAGNOSIS — Z880 Allergy status to penicillin: Secondary | ICD-10-CM | POA: Insufficient documentation

## 2016-01-20 DIAGNOSIS — E559 Vitamin D deficiency, unspecified: Secondary | ICD-10-CM | POA: Diagnosis not present

## 2016-01-20 DIAGNOSIS — R531 Weakness: Secondary | ICD-10-CM | POA: Diagnosis not present

## 2016-01-20 DIAGNOSIS — R079 Chest pain, unspecified: Secondary | ICD-10-CM | POA: Insufficient documentation

## 2016-01-20 DIAGNOSIS — Z91010 Allergy to peanuts: Secondary | ICD-10-CM | POA: Diagnosis not present

## 2016-01-20 DIAGNOSIS — I10 Essential (primary) hypertension: Secondary | ICD-10-CM | POA: Diagnosis not present

## 2016-01-20 DIAGNOSIS — F411 Generalized anxiety disorder: Secondary | ICD-10-CM | POA: Diagnosis not present

## 2016-01-20 DIAGNOSIS — Z79899 Other long term (current) drug therapy: Secondary | ICD-10-CM

## 2016-01-20 DIAGNOSIS — G894 Chronic pain syndrome: Secondary | ICD-10-CM | POA: Diagnosis not present

## 2016-01-20 DIAGNOSIS — K589 Irritable bowel syndrome without diarrhea: Secondary | ICD-10-CM | POA: Diagnosis not present

## 2016-01-20 DIAGNOSIS — M5136 Other intervertebral disc degeneration, lumbar region: Secondary | ICD-10-CM | POA: Insufficient documentation

## 2016-01-20 DIAGNOSIS — F329 Major depressive disorder, single episode, unspecified: Secondary | ICD-10-CM | POA: Insufficient documentation

## 2016-01-20 DIAGNOSIS — Z791 Long term (current) use of non-steroidal anti-inflammatories (NSAID): Secondary | ICD-10-CM | POA: Insufficient documentation

## 2016-01-20 DIAGNOSIS — R4182 Altered mental status, unspecified: Secondary | ICD-10-CM | POA: Diagnosis not present

## 2016-01-20 DIAGNOSIS — F445 Conversion disorder with seizures or convulsions: Secondary | ICD-10-CM

## 2016-01-20 DIAGNOSIS — E785 Hyperlipidemia, unspecified: Secondary | ICD-10-CM | POA: Diagnosis not present

## 2016-01-20 LAB — BASIC METABOLIC PANEL
ANION GAP: 10 (ref 5–15)
BUN: 19 mg/dL (ref 6–20)
CO2: 24 mmol/L (ref 22–32)
Calcium: 8.9 mg/dL (ref 8.9–10.3)
Chloride: 102 mmol/L (ref 101–111)
Creatinine, Ser: 0.86 mg/dL (ref 0.44–1.00)
GFR calc Af Amer: >60 mL/min (ref >60)
GFR calc non Af Amer: >60 mL/min (ref >60)
GLUCOSE: 117 mg/dL — AB (ref 65–99)
POTASSIUM: 4.3 mmol/L (ref 3.5–5.1)
Sodium: 136 mmol/L (ref 135–145)

## 2016-01-20 LAB — RAPID URINE DRUG SCREEN, HOSP PERFORMED
AMPHETAMINES: NOT DETECTED
BENZODIAZEPINES: POSITIVE — AB
Barbiturates: NOT DETECTED
Cocaine: NOT DETECTED
Opiates: NOT DETECTED
TETRAHYDROCANNABINOL: NOT DETECTED

## 2016-01-20 LAB — CBC
HEMATOCRIT: 38.4 % (ref 36.0–46.0)
HEMATOCRIT: 41.5 % (ref 36.0–46.0)
HEMOGLOBIN: 14.6 g/dL (ref 12.0–15.0)
Hemoglobin: 13 g/dL (ref 12.0–15.0)
MCH: 30 pg (ref 26.0–34.0)
MCH: 31.1 pg (ref 26.0–34.0)
MCHC: 33.9 g/dL (ref 30.0–36.0)
MCHC: 35.2 g/dL (ref 30.0–36.0)
MCV: 88.3 fL (ref 78.0–100.0)
MCV: 88.5 fL (ref 78.0–100.0)
Platelets: 222 10*3/uL (ref 150–400)
Platelets: 263 10*3/uL (ref 150–400)
RBC: 4.34 MIL/uL (ref 3.87–5.11)
RBC: 4.7 MIL/uL (ref 3.87–5.11)
RDW: 13.5 % (ref 11.5–15.5)
RDW: 13.8 % (ref 11.5–15.5)
WBC: 10.9 10*3/uL — ABNORMAL HIGH (ref 4.0–10.5)
WBC: 12.3 10*3/uL — ABNORMAL HIGH (ref 4.0–10.5)

## 2016-01-20 LAB — PROTIME-INR
INR: 0.92
Prothrombin Time: 12.4 seconds (ref 11.4–15.2)

## 2016-01-20 LAB — HEPATIC FUNCTION PANEL
ALK PHOS: 73 U/L (ref 38–126)
ALT: 24 U/L (ref 14–54)
AST: 28 U/L (ref 15–41)
Albumin: 3.9 g/dL (ref 3.5–5.0)
BILIRUBIN INDIRECT: 0.2 mg/dL — AB (ref 0.3–0.9)
BILIRUBIN TOTAL: 0.5 mg/dL (ref 0.3–1.2)
Bilirubin, Direct: 0.3 mg/dL (ref 0.1–0.5)
TOTAL PROTEIN: 6.5 g/dL (ref 6.5–8.1)

## 2016-01-20 LAB — I-STAT CHEM 8, ED
BUN: 26 mg/dL — ABNORMAL HIGH (ref 6–20)
Calcium, Ion: 1.1 mmol/L — ABNORMAL LOW (ref 1.15–1.40)
Chloride: 102 mmol/L (ref 101–111)
Creatinine, Ser: 0.9 mg/dL (ref 0.44–1.00)
GLUCOSE: 119 mg/dL — AB (ref 65–99)
HEMATOCRIT: 41 % (ref 36.0–46.0)
HEMOGLOBIN: 13.9 g/dL (ref 12.0–15.0)
POTASSIUM: 4.3 mmol/L (ref 3.5–5.1)
Sodium: 139 mmol/L (ref 135–145)
TCO2: 28 mmol/L (ref 0–100)

## 2016-01-20 LAB — URINALYSIS, ROUTINE W REFLEX MICROSCOPIC
BILIRUBIN URINE: NEGATIVE
Glucose, UA: NEGATIVE mg/dL
HGB URINE DIPSTICK: NEGATIVE
KETONES UR: NEGATIVE mg/dL
Leukocytes, UA: NEGATIVE
NITRITE: NEGATIVE
Protein, ur: NEGATIVE mg/dL
SPECIFIC GRAVITY, URINE: 1.01 (ref 1.005–1.030)
pH: 7 (ref 5.0–8.0)

## 2016-01-20 LAB — TROPONIN I: Troponin I: <0.03 ng/mL (ref <0.03)

## 2016-01-20 LAB — I-STAT TROPONIN, ED: TROPONIN I, POC: 0 ng/mL (ref 0.00–0.08)

## 2016-01-20 LAB — I-STAT BETA HCG BLOOD, ED (MC, WL, AP ONLY): I-stat hCG, quantitative: <5 m[IU]/mL (ref <5)

## 2016-01-20 LAB — CBG MONITORING, ED
GLUCOSE-CAPILLARY: 119 mg/dL — AB (ref 65–99)
Glucose-Capillary: 115 mg/dL — ABNORMAL HIGH (ref 65–99)

## 2016-01-20 LAB — ETHANOL

## 2016-01-20 LAB — GLUCOSE, CAPILLARY: Glucose-Capillary: 98 mg/dL (ref 65–99)

## 2016-01-20 LAB — APTT: aPTT: 28 seconds (ref 24–36)

## 2016-01-20 MED ORDER — FOLIC ACID 1 MG PO TABS: 1.0000 mg | ORAL_TABLET | Freq: Every day | ORAL | Status: DC

## 2016-01-20 MED ORDER — FAMOTIDINE 20 MG PO TABS: 20.0000 mg | ORAL_TABLET | Freq: Two times a day (BID) | ORAL | Status: DC

## 2016-01-20 MED ORDER — IOPAMIDOL (ISOVUE-370) INJECTION 76%
100.0000 mL | Freq: Once | INTRAVENOUS | Status: AC | PRN
  Administered 2016-01-20: 100 mL via INTRAVENOUS

## 2016-01-20 MED ORDER — ENOXAPARIN SODIUM 40 MG/0.4ML ~~LOC~~ SOLN
40.0000 mg | SUBCUTANEOUS | Status: DC
  Administered 2016-01-20: 40 mg via SUBCUTANEOUS
  Filled 2016-01-20: qty 0.4

## 2016-01-20 MED ORDER — PAROXETINE HCL 20 MG PO TABS
20.0000 mg | ORAL_TABLET | Freq: Every day | ORAL | Status: DC
  Filled 2016-01-20: qty 1

## 2016-01-20 MED ORDER — ACETAMINOPHEN 650 MG RE SUPP
650.0000 mg | RECTAL | Status: DC | PRN
  Administered 2016-01-20: 650 mg via RECTAL
  Filled 2016-01-20: qty 1

## 2016-01-20 MED ORDER — ATENOLOL 25 MG PO TABS: 12.5000 mg | ORAL_TABLET | Freq: Every day | ORAL | Status: DC

## 2016-01-20 MED ORDER — SODIUM CHLORIDE 0.9 % IV SOLN
1000.0000 mg | Freq: Once | INTRAVENOUS | Status: AC
  Administered 2016-01-20: 1000 mg via INTRAVENOUS
  Filled 2016-01-20: qty 10

## 2016-01-20 MED ORDER — SODIUM CHLORIDE 0.9 % IV SOLN
75.0000 mL/h | INTRAVENOUS | Status: DC
  Administered 2016-01-20: 75 mL/h via INTRAVENOUS

## 2016-01-20 MED ORDER — ONDANSETRON HCL 4 MG/2ML IJ SOLN: 4.0000 mg | Freq: Four times a day (QID) | INTRAMUSCULAR | Status: DC | PRN

## 2016-01-20 MED ORDER — ADULT MULTIVITAMIN W/MINERALS CH: 1.0000 | ORAL_TABLET | Freq: Every day | ORAL | Status: DC

## 2016-01-20 MED ORDER — LORAZEPAM 1 MG PO TABS: 1.0000 mg | ORAL_TABLET | Freq: Four times a day (QID) | ORAL | Status: DC | PRN

## 2016-01-20 MED ORDER — ONDANSETRON HCL 4 MG PO TABS: 4.0000 mg | ORAL_TABLET | Freq: Four times a day (QID) | ORAL | Status: DC | PRN

## 2016-01-20 MED ORDER — SODIUM CHLORIDE 0.9 % IV BOLUS (SEPSIS)
1000.0000 mL | Freq: Once | INTRAVENOUS | Status: AC
  Administered 2016-01-20: 1000 mL via INTRAVENOUS

## 2016-01-20 MED ORDER — LORAZEPAM 2 MG/ML IJ SOLN
0.0000 mg | Freq: Four times a day (QID) | INTRAMUSCULAR | Status: DC
  Administered 2016-01-20: 1 mg via INTRAVENOUS
  Administered 2016-01-20 – 2016-01-21 (×3): 2 mg via INTRAVENOUS
  Filled 2016-01-20 (×4): qty 1

## 2016-01-20 MED ORDER — SODIUM CHLORIDE 0.9 % IV SOLN
1000.0000 mg | Freq: Once | INTRAVENOUS | Status: DC
  Filled 2016-01-20: qty 10

## 2016-01-20 MED ORDER — POLYETHYLENE GLYCOL 3350 17 G PO PACK: 17.0000 g | PACK | Freq: Every day | ORAL | Status: DC | PRN

## 2016-01-20 MED ORDER — LORAZEPAM 2 MG/ML IJ SOLN
1.0000 mg | Freq: Once | INTRAMUSCULAR | Status: AC
  Administered 2016-01-20: 1 mg via INTRAVENOUS

## 2016-01-20 MED ORDER — ACETAMINOPHEN 325 MG PO TABS: 650.0000 mg | ORAL_TABLET | ORAL | Status: DC | PRN

## 2016-01-20 MED ORDER — THIAMINE HCL 100 MG/ML IJ SOLN: 100.0000 mg | Freq: Every day | INTRAMUSCULAR | Status: DC

## 2016-01-20 MED ORDER — ALPRAZOLAM 0.5 MG PO TABS: 0.5000 mg | ORAL_TABLET | Freq: Every evening | ORAL | Status: DC | PRN

## 2016-01-20 MED ORDER — VITAMIN B-1 100 MG PO TABS: 100.0000 mg | ORAL_TABLET | Freq: Every day | ORAL | Status: DC

## 2016-01-20 MED ORDER — ALPRAZOLAM ER 1 MG PO TB24: 1.0000 mg | ORAL_TABLET | Freq: Every day | ORAL | Status: DC

## 2016-01-20 MED ORDER — LORAZEPAM 2 MG/ML IJ SOLN
1.0000 mg | Freq: Four times a day (QID) | INTRAMUSCULAR | Status: DC | PRN
  Administered 2016-01-20: 2 mg via INTRAVENOUS
  Filled 2016-01-20: qty 1

## 2016-01-20 MED ORDER — IBUPROFEN 200 MG PO TABS: 600.0000 mg | ORAL_TABLET | Freq: Four times a day (QID) | ORAL | Status: DC | PRN

## 2016-01-20 MED ORDER — GADOBENATE DIMEGLUMINE 529 MG/ML IV SOLN
15.0000 mL | Freq: Once | INTRAVENOUS | Status: AC | PRN
  Administered 2016-01-20: 15 mL via INTRAVENOUS

## 2016-01-20 MED ORDER — PANTOPRAZOLE SODIUM 40 MG PO TBEC: 40.0000 mg | DELAYED_RELEASE_TABLET | Freq: Every day | ORAL | Status: DC

## 2016-01-20 MED ORDER — LORAZEPAM 2 MG/ML IJ SOLN: 0.0000 mg | Freq: Two times a day (BID) | INTRAMUSCULAR | Status: DC

## 2016-01-20 MED ORDER — PAROXETINE HCL 20 MG PO TABS: 20.0000 mg | ORAL_TABLET | Freq: Every day | ORAL | Status: DC

## 2016-01-20 NOTE — Progress Notes (Signed)
Subjective: Unresponsive to verbal and tactile  Stimulation. Patient reportedly had seizure- like activity starting at about 4 PM today with repeated episodes observed subsequently. No clear postictal state was described. She was given repeated doses of  Ativan for acute management of possible seizure activity.  Objective: Current vital signs: BP 121/72 (BP Location: Left Arm)   Pulse (!) 117   Temp 98.7 F (37.1 C) (Oral)   Resp 16   Ht 5\' 8"  (1.727 m)   Wt 74.1 kg (163 lb 5.8 oz)   LMP 01/15/2015   SpO2 97%   BMI 24.84 kg/m   Neurologic Exam: Patient was resting comfortably with no acute distress. She was not arouseable with verbal and tactile stimulation. No seizure activity was noted. Muscle tone was flaccid throughout.  Medications: I have reviewed the patient's current medications.  Assessment/Plan: Patient continues to have seizure- like activity. Whether she is experiencing actual seizures or exhibiting pseudoseizures is unclear. CT scan of her head  and MRI of the brain were unremarkable.  Recommendations: 1. Loading dose of Keppra 1000 mg IV, followed by 500 mg every 12 hours 2. Stat EEG to rule out status epilepticus as continued long-term monitoring 3. Recommend transfer to stepdown unit for closer monitoring and management  C.R. Roseanne RenoStewart, MD [facility] [phone]  01/20/2016  10:11 PM

## 2016-01-20 NOTE — ED Notes (Signed)
Patient exhibiting full strength in all extremities while attempting to get out of bed to pee. She is talking without slurred speech, and following commands. Patient assisted to bedside commode with 3 staff assist.

## 2016-01-20 NOTE — ED Notes (Signed)
Boyfriend in waiting room

## 2016-01-20 NOTE — Progress Notes (Signed)
On change of shift it was reported to this writer that patient had started having seizure like activities/ jerking starting approximately 1830. Attending and neurologist (on call) were then notified. As we moved on with report, the father came out and stated she is having another seizure. Upon entry the patient was jerking her upper body for approximately 10-15 seconds. V/S were stable, CBG was 98 and physical assessment WNL. Neurologist, Dr. Mervin Kung. Stewart was notified by this writer as well as the RRT. Patient received Keppra 1 gram IV as Dr Roseanne RenoStewart assessed her. EEG was also ordered. [facility] Patient was transferred to 3S04 after EEG was done.

## 2016-01-20 NOTE — ED Notes (Signed)
[facility]

## 2016-01-20 NOTE — Progress Notes (Signed)
Called by nursing regarding patient - having shaking episodes, about 10-15 minutes apart, lasting for about 30s at a time. Pt not unresponsive during this time - per nursing, pt verbalizes "pain" during these episodes. Will continue to observe for now.  Alicia HeritageJacob J Stinson, DO 01/20/2016 6:15 PM

## 2016-01-20 NOTE — ED Provider Notes (Signed)
[facility]-EMERGENCY DEPT Provider Note   CSN: [id number] Arrival date & time: 01/20/16  0011 By signing my name below, I, Bridgette Habermann, attest that this documentation has been prepared under the direction and in the presence of Glynn Octave, MD. Electronically Signed: Bridgette Habermann, ED Scribe. 01/20/16. 12:54 AM.  History   Chief Complaint Chief Complaint  Patient presents with  . Seizures   LEVEL 5 CAVEAT: HPI and ROS limited due to AMS.  HPI Comments: Alicia [name] is a 45 y.o. female with h/o anxiety and depression who presents to the Emergency Department by EMS for seizures onset just PTA (~11:15pm yesterday). Per spouse, pt woke up from her sleep and was complaining of chest pain then subsequently had a seizure. Spouse notes that pt's seizure lasted about 4-5 minutes. Pt was not given anything en route; however, EMS reports pt had 2 focal seizures and woke up a few times and was confused. Pt denies drug use or alcohol use. No known h/o seizures. No known fevers.   The history is provided by the patient, the EMS personnel and the spouse. The history is limited by the condition of the patient. No language interpreter was used.   Past Medical History:  Diagnosis Date  . Anemia   . Anxiety   . Depression   . GERD (gastroesophageal reflux disease)   . Hyperlipidemia   . IBS (irritable bowel syndrome)   . Restless leg syndrome     Patient Active Problem List   Diagnosis Date Noted  . GAD (generalized anxiety disorder) 02/14/2015  . Neck pain 02/14/2015  . History of hysterectomy 02/14/2015  . S/P hysterectomy 02/02/2015  . Grieving 12/16/2014  . Acute stress reaction 12/16/2014  . Iron deficiency anemia due to chronic blood loss 08/29/2014  . Insomnia, persistent 08/14/2014  . Major depression (HCC) 08/14/2014  . Temporomandibular joint sounds on opening and/or closing the jaw 08/14/2014  . Degenerative disc disease, lumbar 08/14/2014  . Bleeding internal hemorrhoids 08/14/2014   . Gastric reflux 08/14/2014  . Blood glucose elevated 08/14/2014  . Irritable bowel syndrome with constipation 08/14/2014  . Hypertriglyceridemia 08/14/2014  . Overweight 08/14/2014  . Restless legs syndrome 08/14/2014  . Tinnitus 08/14/2014  . Vitamin D deficiency 08/14/2014  . Tachycardia 11/25/2012  . DOE (dyspnea on exertion) 11/06/2012  . Previous cesarean delivery, delivered, with or without mention of antepartum condition 01/19/2012    Past Surgical History:  Procedure Laterality Date  . ABDOMINOPLASTY  Feb. 2015  . BREAST SURGERY Bilateral December 2015   Breast Reduction  . CESAREAN SECTION  2011  . CYSTOSCOPY  02/02/2015   Procedure: CYSTOSCOPY;  Surgeon: Nadara Mustard, MD;  Location: [facility] ORS;  Service: Gynecology;;  . Joya Gaskins AND CURETTAGE OF UTERUS  2003, 2005, 2008  . HERNIA REPAIR  1999   Umbilical  . KNEE SURGERY Right 2006   Knee Arthroscopy  . LAPAROSCOPIC BILATERAL SALPINGECTOMY Bilateral 02/02/2015   Procedure: LAPAROSCOPIC BILATERAL SALPINGECTOMY;  Surgeon: Nadara Mustard, MD;  Location: [facility] ORS;  Service: Gynecology;  Laterality: Bilateral;  . LAPAROSCOPIC HYSTERECTOMY N/A 02/02/2015   Procedure: HYSTERECTOMY TOTAL LAPAROSCOPIC;  Surgeon: Nadara Mustard, MD;  Location: [facility] ORS;  Service: Gynecology;  Laterality: N/A;    OB History    Gravida Para Term Preterm AB Living   11 9 6 3 2 10    SAB TAB Ectopic Multiple Live Births   2 0 0 1 10       Home Medications    Prior  to Admission medications   Medication Sig Start Date End Date Taking? Authorizing Provider  ALPRAZolam Prudy Feeler(XANAX) 0.5 MG tablet Take 1 tablet (0.5 mg total) by mouth at bedtime as needed for anxiety. 11/01/15   Alba CoryKrichna Sowles, MD  ALPRAZOLAM XR 1 MG 24 hr tablet Take 1 tablet (1 mg total) by mouth daily. 11/01/15   Alba CoryKrichna Sowles, MD  atenolol (TENORMIN) 25 MG tablet Take 0.5-1 tablets (12.5-25 mg total) by mouth daily. 11/01/15   Alba CoryKrichna Sowles, MD  cyclobenzaprine (FLEXERIL) 10  MG tablet Take 1 tablet (10 mg total) by mouth daily. 07/31/15   Alba CoryKrichna Sowles, MD  Multiple Vitamins-Minerals (CENTRUM PO) Take 1 tablet by mouth daily. Reported on 02/02/2015    Historical Provider, MD  pantoprazole (PROTONIX) 40 MG tablet Take 1 tablet (40 mg total) by mouth daily. 07/31/15   Alba CoryKrichna Sowles, MD  PARoxetine (PAXIL) 40 MG tablet Take 0.5 tablets (20 mg total) by mouth daily. 11/01/15   Alba CoryKrichna Sowles, MD  ranitidine (ZANTAC) 300 MG tablet Take 0.5 tablets (150 mg total) by mouth 2 (two) times daily. 07/31/15   Alba CoryKrichna Sowles, MD  zolpidem (AMBIEN) 10 MG tablet Take 1 tablet (10 mg total) by mouth at bedtime as needed. 07/31/15   Alba CoryKrichna Sowles, MD    Family History Family History  Problem Relation Age of Onset  . Hypertension Mother   . Hyperlipidemia Mother   . Heart Problems Father     hole in heart  . Prostate cancer Maternal Grandfather     Social History Social History  Substance Use Topics  . Smoking status: Never Smoker  . Smokeless tobacco: Never Used  . Alcohol use No     Allergies   Azithromycin; Peanuts [peanut oil]; and Penicillins   Review of Systems Review of Systems  Unable to perform ROS: Mental status change   Physical Exam Updated Vital Signs BP 131/69   Pulse 105   Temp 98 F (36.7 C) (Oral)   Resp 20   LMP 01/15/2015   SpO2 94%   Physical Exam  Constitutional: She is oriented to person, place, and time. She appears well-developed and well-nourished. No distress.  Minimally verbal, not answering questions. Poorly cooperative.  HENT:  Head: Normocephalic and atraumatic.  Mouth/Throat: Oropharynx is clear and moist. No oropharyngeal exudate.  Eyes: Conjunctivae and EOM are normal. Pupils are equal, round, and reactive to light.  Pupils dilated.  Neck: Normal range of motion. Neck supple.  No meningismus.  Cardiovascular: Normal rate, regular rhythm, normal heart sounds and intact distal pulses.   No murmur heard. Pulmonary/Chest:  Effort normal and breath sounds normal. No respiratory distress.  Abdominal: Soft. There is no tenderness. There is no rebound and no guarding.  Musculoskeletal: Normal range of motion. She exhibits no edema or tenderness.  Neurological: She is alert and oriented to person, place, and time. No cranial nerve deficit. She exhibits normal muscle tone. Coordination normal.  Poor effort on neuro exam throughout. No appreciable facial assymmetry. Decreased grip strength on left compared to right. Difficulty holding left leg off the bed. 5/5 strength on the right hand and right leg. No tongue trauma or incontinence.  Skin: Skin is warm.  Psychiatric: She has a normal mood and affect. Her behavior is normal.  Nursing note and vitals reviewed.   ED Treatments / Results  DIAGNOSTIC STUDIES: Oxygen Saturation is 94% on RA, poor by my interpretation.    COORDINATION OF CARE: 12:47 AM Discussed treatment plan with pt at bedside.  Labs (all labs ordered are listed, but only abnormal results are displayed) Labs Reviewed  BASIC METABOLIC PANEL - Abnormal; Notable for the following:       Result Value   Glucose, Bld 117 (*)    All other components within normal limits  CBC - Abnormal; Notable for the following:    WBC 12.3 (*)    All other components within normal limits  RAPID URINE DRUG SCREEN, HOSP PERFORMED - Abnormal; Notable for the following:    Benzodiazepines POSITIVE (*)    All other components within normal limits  HEPATIC FUNCTION PANEL - Abnormal; Notable for the following:    Indirect Bilirubin 0.2 (*)    All other components within normal limits  CBC - Abnormal; Notable for the following:    WBC 10.9 (*)    All other components within normal limits  CBG MONITORING, ED - Abnormal; Notable for the following:    Glucose-Capillary 115 (*)    All other components within normal limits  CBG MONITORING, ED - Abnormal; Notable for the following:    Glucose-Capillary 119 (*)    All other  components within normal limits  I-STAT CHEM 8, ED - Abnormal; Notable for the following:    BUN 26 (*)    Glucose, Bld 119 (*)    Calcium, Ion 1.10 (*)    All other components within normal limits  PROTIME-INR  APTT  ETHANOL  URINALYSIS, ROUTINE W REFLEX MICROSCOPIC (NOT AT [facility])  TROPONIN I  I-STAT TROPOININ, ED  I-STAT CHEM 8, ED  I-STAT TROPOININ, ED  I-STAT BETA HCG BLOOD, ED ([facility], [facility], [facility] ONLY)    EKG  EKG Interpretation None       Radiology No results found.  Procedures Procedures (including critical care time)  Medications Ordered in ED Medications - No data to display   Initial Impression / Assessment and Plan / ED Course  I have reviewed the triage vital signs and the nursing notes.  Pertinent labs & imaging results that were available during my care of the patient were reviewed by me and considered in my medical decision making (see chart for details).  Clinical Course   Patient presents via EMS with new onset seizure witnessed by significant other. Apparently woke up complaining of chest pain and then had seizure activity for 5 minutes. EMS reports episodes of confusion and twitching during transport. She had weakness on her left side.  Code stroke was activated on arrival. CT head is negative. CTA chest obtained given neuro deficits and complaint of chest pain.  Patient is minimally verbal appears to have some left-sided weakness  Dr. Roseanne RenoStewart feels patient likely has psychological issues and possible substance abuse issues affecting her presentation.  CT is negative for dissection. Troponin is negative. Patient remains tachycardic. Drug screen is positive for benzodiazepines which she is prescribed. Denies any alcohol or drug use.  She remains somnolent, not at baseline per her boyfriend. Still with weakness on the left side. Per neurology will plan admission for further workup and MRI.  Patient remains tachycardic. Question possible component of  alcohol or benzodiazepine withdrawal. Discussed with Dr. Julian ReilGardner who will admit to observation  ED ECG REPORT   Date: 01/20/2016  Rate: 103  Rhythm: sinus tachycardia  QRS Axis: normal  Intervals: normal  ST/T Wave abnormalities: normal  Conduction Disutrbances:none  Narrative Interpretation:   Old EKG Reviewed: none available  I have personally reviewed the EKG tracing and agree with the computerized printout as noted.  Final Clinical Impressions(s) / ED Diagnoses   Final diagnoses:  Seizure (HCC)  Altered mental status, unspecified altered mental status type   2:48 AM Head CT unremarkable. Spoke to pt's boyfriend who witnessed seizure. He stated that pt was completely asymptomatic prior. Pt states she is still experiencing chest pain which she described as "burning". Pt still feels somewhat weak on her left side. Pt denies alcohol use and drug use. Pt is minimally verbal at this time.   New Prescriptions New Prescriptions   No medications on file   I personally performed the services described in this documentation, which was scribed in my presence. The recorded information has been reviewed and is accurate.     Glynn Octave, MD 01/20/16 [phone]

## 2016-01-20 NOTE — Telephone Encounter (Signed)
Patient's father contacted doctor on-call for her primary (Dr. Carlynn PurlSowles) Pt had seizure last night They did ct scans and MRIs Several seizures back-to-back, still having seizures He wants to know what the next step is She is currently hospitalized at [facility] They are trying to get neurologist but changing shifts Father is frustrated I called nursing unit, they are getting rapid response team and contacting neurologist

## 2016-01-20 NOTE — H&P (Addendum)
History and Physical  Alicia [name] ZOX:[id number] DOB: [birth year] DOA: 01/20/2016  Referring physician: Dr Consuella Loseancourt, ED physician PCP: Ruel FavorsKrichna F Sowles, MD  Outpatient Specialists: none  Chief Complaint: altered mental status, seizures  HPI: Alicia [name] is a 45 y.o. female with a history of chronic NSAID has a pains for anxiety and depression, GERD, hyperlipidemia, IBS. Patient is hypersomnolent due to altered mental status and medications given in the emergency department and for MRI, therefore the patient is nonconversant at this point and history is obtained by the chart. Patient was brought to the emergency department by EMS after having 2-3 self-limiting, generalized convulsions and unresponsiveness. The first one started at around approximately 11:15 PM last night and lasted for about 4-5 minutes. The patient had 2 other episodes with EMS in route to the hospital. On arrival, she appeared to have reduced movement of her left upper extremity compared to her right UE stroke was activated. CT of her head was normal, as was her MRI. Patient did receive 2 mg of Ativan prior to the MRI and 2mg  more for alcohol/benzodiazepine withdrawal prophylaxis.   Emergency Department Course: Slightly elevated white count at 12.3. BUN slightly elevated at 24. UA is normal. UDS shows benzodiazepine. Alcohol level is normal. MRI and CT were unremarkable.  Review of Systems:  Unable to obtain  Past Medical History:  Diagnosis Date  . Anemia   . Anxiety   . Depression   . GERD (gastroesophageal reflux disease)   . Hyperlipidemia   . IBS (irritable bowel syndrome)   . Restless leg syndrome    Past Surgical History:  Procedure Laterality Date  . ABDOMINOPLASTY  Feb. 2015  . BREAST SURGERY Bilateral December 2015   Breast Reduction  . CESAREAN SECTION  2011  . CYSTOSCOPY  02/02/2015   Procedure: CYSTOSCOPY;  Surgeon: Nadara Mustardobert P Harris, MD;  Location: [facility] ORS;  Service: Gynecology;;  .  Joya GaskinsILATION AND CURETTAGE OF UTERUS  2003, 2005, 2008  . HERNIA REPAIR  1999   Umbilical  . KNEE SURGERY Right 2006   Knee Arthroscopy  . LAPAROSCOPIC BILATERAL SALPINGECTOMY Bilateral 02/02/2015   Procedure: LAPAROSCOPIC BILATERAL SALPINGECTOMY;  Surgeon: Nadara Mustardobert P Harris, MD;  Location: [facility] ORS;  Service: Gynecology;  Laterality: Bilateral;  . LAPAROSCOPIC HYSTERECTOMY N/A 02/02/2015   Procedure: HYSTERECTOMY TOTAL LAPAROSCOPIC;  Surgeon: Nadara Mustardobert P Harris, MD;  Location: [facility] ORS;  Service: Gynecology;  Laterality: N/A;   Social History:  reports that she has never smoked. She has never used smokeless tobacco. She reports that she does not drink alcohol or use drugs. Patient lives at Home  Allergies  Allergen Reactions  . Azithromycin Diarrhea  . Peanuts [Peanut Oil] Swelling    "swelling of lips"   . Penicillins Hives and Swelling    Family History  Problem Relation Age of Onset  . Hypertension Mother   . Hyperlipidemia Mother   . Heart Problems Father     hole in heart  . Prostate cancer Maternal Grandfather      Prior to Admission medications   Medication Sig Start Date End Date Taking? Authorizing Provider  ALPRAZolam Prudy Feeler(XANAX) 0.5 MG tablet Take 1 tablet (0.5 mg total) by mouth at bedtime as needed for anxiety. 11/01/15   Alba CoryKrichna Sowles, MD  ALPRAZOLAM XR 1 MG 24 hr tablet Take 1 tablet (1 mg total) by mouth daily. 11/01/15   Alba CoryKrichna Sowles, MD  atenolol (TENORMIN) 25 MG tablet Take 0.5-1 tablets (12.5-25 mg total) by mouth daily. 11/01/15  Alba Cory, MD  cyclobenzaprine (FLEXERIL) 10 MG tablet Take 1 tablet (10 mg total) by mouth daily. 07/31/15   Alba Cory, MD  Multiple Vitamins-Minerals (CENTRUM PO) Take 1 tablet by mouth daily. Reported on 02/02/2015    Historical Provider, MD  pantoprazole (PROTONIX) 40 MG tablet Take 1 tablet (40 mg total) by mouth daily. 07/31/15   Alba Cory, MD  PARoxetine (PAXIL) 40 MG tablet Take 0.5 tablets (20 mg total) by mouth daily.  11/01/15   Alba Cory, MD  ranitidine (ZANTAC) 300 MG tablet Take 0.5 tablets (150 mg total) by mouth 2 (two) times daily. 07/31/15   Alba Cory, MD  zolpidem (AMBIEN) 10 MG tablet Take 1 tablet (10 mg total) by mouth at bedtime as needed. 07/31/15   Alba Cory, MD    Physical Exam: BP (!) 130/46 (BP Location: Right Arm)   Pulse (!) 124   Temp 98.7 F (37.1 C) (Oral)   Resp 20   Ht 5\' 8"  (1.727 m)   Wt 74.1 kg (163 lb 5.8 oz)   LMP 01/15/2015   SpO2 100%   BMI 24.84 kg/m   General: Middle-aged Caucasian female. Somnolent - arouses with stimulation, but respect to sleep quickly.. No acute cardiopulmonary distress.  HEENT: Normocephalic atraumatic.  Right and left ears normal in appearance. Sclerae anicteric and noninjected. Dry mucosal membranes. No mucosal lesions.  Neck: Neck supple without lymphadenopathy. No carotid bruits. No masses palpated.  Cardiovascular: Regular rate with normal S1-S2 sounds. No murmurs, rubs, gallops auscultated. No JVD.  Respiratory: Good respiratory effort with no wheezes, rales, rhonchi. Lungs clear to auscultation bilaterally.  No accessory muscle use. Abdomen: Soft, nontender, nondistended. Active bowel sounds. No masses or hepatosplenomegaly  Skin: No rashes, lesions, or ulcerations.  Dry, warm to touch. 2+ dorsalis pedis and radial pulses. Musculoskeletal: No calf or leg pain. All major joints not erythematous nontender.  No upper or lower joint deformation.  Good ROM.  No contractures  Psychiatric: Unable to assess Neurologic: Unable to obtain at this point, but has equal grip strength bilaterally           Labs on Admission: I have personally reviewed following labs and imaging studies  CBC:  Recent Labs Lab 01/20/16 0026 01/20/16 0049  WBC 12.3*  --   HGB 14.6 13.9  HCT 41.5 41.0  MCV 88.3  --   PLT 263  --    Basic Metabolic Panel:  Recent Labs Lab 01/20/16 0026 01/20/16 0049  NA 136 139  K 4.3 4.3  CL 102 102  CO2  24  --   GLUCOSE 117* 119*  BUN 19 26*  CREATININE 0.86 0.90  CALCIUM 8.9  --    GFR: Estimated Creatinine Clearance: 79.6 mL/min (by C-G formula based on SCr of 0.9 mg/dL). Liver Function Tests:  Recent Labs Lab 01/20/16 0052  AST 28  ALT 24  ALKPHOS 73  BILITOT 0.5  PROT 6.5  ALBUMIN 3.9   No results for input(s): LIPASE, AMYLASE in the last 168 hours. No results for input(s): AMMONIA in the last 168 hours. Coagulation Profile:  Recent Labs Lab 01/20/16 0043  INR 0.92   Cardiac Enzymes:  Recent Labs Lab 01/20/16 0341  TROPONINI <0.03   BNP (last 3 results) No results for input(s): PROBNP in the last 8760 hours. HbA1C: No results for input(s): HGBA1C in the last 72 hours. CBG:  Recent Labs Lab 01/20/16 0036 01/20/16 0623  GLUCAP 115* 119*   Lipid Profile: No results for  input(s): CHOL, HDL, LDLCALC, TRIG, CHOLHDL, LDLDIRECT in the last 72 hours. Thyroid Function Tests: No results for input(s): TSH, T4TOTAL, FREET4, T3FREE, THYROIDAB in the last 72 hours. Anemia Panel: No results for input(s): VITAMINB12, FOLATE, FERRITIN, TIBC, IRON, RETICCTPCT in the last 72 hours. Urine analysis:    Component Value Date/Time   COLORURINE YELLOW 01/20/2016 0048   APPEARANCEUR CLEAR 01/20/2016 0048   LABSPEC 1.010 01/20/2016 0048   PHURINE 7.0 01/20/2016 0048   GLUCOSEU NEGATIVE 01/20/2016 0048   HGBUR NEGATIVE 01/20/2016 0048   BILIRUBINUR NEGATIVE 01/20/2016 0048   KETONESUR NEGATIVE 01/20/2016 0048   PROTEINUR NEGATIVE 01/20/2016 0048   NITRITE NEGATIVE 01/20/2016 0048   LEUKOCYTESUR NEGATIVE 01/20/2016 0048   Sepsis Labs: @LABRCNTIP (procalcitonin:4,lacticidven:4) )No results found for this or any previous visit (from the past 240 hour(s)).   Radiological Exams on Admission: Mr Laqueta Jean And Wo Contrast  Result Date: 01/20/2016 CLINICAL DATA:  Initial evaluation for acute seizure, left-sided weakness. EXAM: MRI HEAD WITHOUT AND WITH CONTRAST TECHNIQUE:  Multiplanar, multiecho pulse sequences of the brain and surrounding structures were obtained without and with intravenous contrast. CONTRAST:  15mL MULTIHANCE GADOBENATE DIMEGLUMINE 529 MG/ML IV SOLN COMPARISON:  Prior CT from earlier the same day. FINDINGS: Brain: Study degraded by motion artifact and patient positioning. Cerebral volume within normal limits for age. No significant cerebral white matter disease. No abnormal foci of restricted diffusion to suggest acute or subacute ischemia. Gray-white matter differentiation maintained. No evidence for acute or chronic intracranial hemorrhage. No encephalomalacia to suggest chronic infarction. No mass lesion, midline shift or mass effect. No hydrocephalus. No extra-axial fluid collection. No intrinsic temporal lobe abnormality. No abnormal enhancement. Major dural sinuses are patent. Pituitary gland and suprasellar region within normal limits. Midline structures intact and normal. Vascular: Major intracranial vascular flow voids are well preserved and normal. Skull and upper cervical spine: Craniocervical junction within normal limits. Visualized upper cervical spine normal. Bone marrow signal intensity within normal limits. No scalp soft tissue abnormality. Sinuses/Orbits: Globes and orbital soft tissues within normal limits. Paranasal sinuses are clear. No significant mastoid effusion. Inner ear structures grossly normal. IMPRESSION: Normal brain MRI.  No acute intracranial process identified. Electronically Signed   By: Rise Mu M.D.   On: 01/20/2016 06:38   Ct Angio Chest/abd/pel For Dissection W And/or Wo Contrast  Result Date: 01/20/2016 CLINICAL DATA:  Acute onset of generalized chest pain and left-sided weakness. Initial encounter. EXAM: CT ANGIOGRAPHY CHEST, ABDOMEN AND PELVIS TECHNIQUE: Multidetector CT imaging through the chest, abdomen and pelvis was performed using the standard protocol during bolus administration of intravenous  contrast. Multiplanar reconstructed images and MIPs were obtained and reviewed to evaluate the vascular anatomy. CONTRAST:  100 mL of Isovue 370 IV contrast COMPARISON:  CTA of the chest performed 06/01/2005 FINDINGS: CTA CHEST FINDINGS Cardiovascular: There is no evidence of significant pulmonary embolus. The heart is unremarkable in appearance. No calcific atherosclerotic disease is seen. There is no evidence of aortic dissection. There is no evidence of aneurysmal dilatation. The great vessels are unremarkable in appearance. Mediastinum/Nodes: Trace pericardial fluid remains within normal limits. No mediastinal lymphadenopathy is seen. The visualized portions of the thyroid gland are unremarkable. No axillary lymphadenopathy is appreciated. Lungs/Pleura: Bibasilar atelectasis is noted. No pleural effusion or pneumothorax is seen. No masses are identified. Musculoskeletal: No acute osseous abnormalities are identified. The visualized musculature is unremarkable in appearance. Review of the MIP images confirms the above findings. CTA ABDOMEN AND PELVIS FINDINGS VASCULAR Aorta: There is no evidence  for aortic dissection. There is no evidence of aneurysmal dilatation. No calcific atherosclerotic disease is seen. Celiac: The celiac trunk remains intact. SMA: The superior mesenteric artery is unremarkable in appearance. Renals: The renal arteries are intact bilaterally. IMA: The inferior mesenteric artery remains intact. Inflow: The common, external and internal iliac arteries are grossly unremarkable. The common femoral arteries and their proximal branches are grossly unremarkable. Veins: The venous vasculature is grossly unremarkable. The inferior vena cava tracks to the left of the abdominal aorta, and is unremarkable in appearance. Review of the MIP images confirms the above findings. NON-VASCULAR Hepatobiliary: The liver is unremarkable in appearance. The gallbladder is unremarkable in appearance. The common bile  duct remains normal in caliber. Pancreas: The pancreas is within normal limits. Spleen: The spleen is unremarkable in appearance. Adrenals/Urinary Tract: The adrenal glands are unremarkable in appearance. The kidneys are within normal limits. There is no evidence of hydronephrosis. No renal or ureteral stones are identified. No perinephric stranding is seen. Stomach/Bowel: The stomach is unremarkable in appearance. The small bowel is within normal limits. The appendix is normal in caliber, without evidence of appendicitis. The colon is unremarkable in appearance. Lymphatic: No retroperitoneal or pelvic sidewall lymphadenopathy is seen. Reproductive: The bladder is mildly distended and grossly unremarkable. The patient is status post hysterectomy. The ovaries are grossly symmetric. No suspicious adnexal masses are seen. Other: No additional soft tissue abnormalities are seen. Musculoskeletal: No acute osseous abnormalities are identified. The visualized musculature is unremarkable in appearance. Review of the MIP images confirms the above findings. IMPRESSION: 1. No evidence of significant pulmonary embolus. 2. No evidence of aortic dissection. No evidence of aneurysmal dilatation. No calcific atherosclerotic disease seen. 3. Bibasilar atelectasis noted.  Lungs otherwise clear. Electronically Signed   By: Roanna RaiderJeffery  Chang M.D.   On: 01/20/2016 02:43   Ct Head Code Stroke W/o Cm  Result Date: 01/20/2016 CLINICAL DATA:  Code stroke. Initial evaluation for acute left-sided weakness status post seizure. EXAM: CT HEAD WITHOUT CONTRAST TECHNIQUE: Contiguous axial images were obtained from the base of the skull through the vertex without intravenous contrast. COMPARISON:  None. FINDINGS: Brain: Cerebral volume within normal limits. Gray-white matter differentiation maintained. No acute intracranial hemorrhage. No evidence for acute large vessel territory infarct. No mass lesion, midline shift, or mass effect. No  hydrocephalus. No extra-axial fluid collection. Vascular: No hyperdense vessel. Skull: Scalp soft tissues within normal limits.  Calvarium intact. Sinuses/Orbits: Globes and orbital soft tissues within normal limits. Paranasal sinuses are clear. No mastoid effusion. Other: Negative ASPECTS (Alberta Stroke Program Early CT Score) - Ganglionic level infarction (caudate, lentiform nuclei, internal capsule, insula, M1-M3 cortex): 7 - Supraganglionic infarction (M4-M6 cortex): 3 Total score (0-10 with 10 being normal): 10 IMPRESSION: 1. No acute intracranial process identified. 2. ASPECTS is 10 Critical Value/emergent results were called by telephone at the time of interpretation on 01/20/2016 at 1:40 am to Dr. Glynn OctaveSTEPHEN RANCOUR , who verbally acknowledged these results. For discussion with Dr. Homero FellersFrank or, these results will also be conveyed to the stroke neurologist, Dr. Roseanne RenoStewart as well. Electronically Signed   By: Rise MuBenjamin  McClintock M.D.   On: 01/20/2016 01:43    Assessment/Plan: Principal Problem:   Altered mental status Active Problems:   Seizure (HCC)   Chronic prescription benzodiazepine use   Leukocytosis    This patient was discussed with the ED physician, including pertinent vitals, physical exam findings, labs, and imaging.  We also discussed care given by the ED provider.  #1 altered mental  status  Observation on telemetry  At this point, unable to assess patient mental status and how altered she is. Certainly her current state is secondary to sedation from benzodiazepines. We'll hold sedating medications, however be careful of sending her into withdrawal. We'll continue to watch for clearing as the patient could have been in a postictal state. #2 seizure  Appreciate neurology assistance  MRI negative  Continue neuro checks  Repeat CBC and BMP #3 chronic prescription benzodiazepine use  Continue benzodiazepines to avoid withdrawal #4 leukocytosis  Likely secondary to seizures. No  evidence of infection  DVT prophylaxis: Lovenox Consultants: Neurology Code Status: Full code presumed Family Communication: None  Disposition Plan: He should be able to return home following admission   Levie Heritage, DO [facility] Pager [phone]  If 7PM-7AM, please contact night-coverage [email or web address] Password [email or web address]

## 2016-01-20 NOTE — ED Notes (Signed)
44M updated on pt's pending arrival, up to floor with EMT, pt resting sleeping, NAD, calm, VSS[facility] on monitor. HR improved.

## 2016-01-20 NOTE — ED Notes (Signed)
Pt sleeping/ resting on L side, NAD, calm while left alone. Agitated with staff involvement. [facility]. BP soft while lying on L side, will continue to monitor.

## 2016-01-20 NOTE — Procedures (Signed)
ELECTROENCEPHALOGRAM REPORT  Patient: Alicia [name]       Room #: 1O103S04  EEG No. ID: [id number] AgeOdette Horns: 45 y.o.        Sex: female Referring Physician: Manus RuddSTINSON, J Report Date:  01/20/2016        Interpreting Physician: Aline BrochureSTEWART,Travante Knee R  History: Odette HornsDorothy V [name] is an 45 y.o. female history of restless leg syndrome, IBS, hyperlipidemia, depression and anxiety who presented with new onset seizure-like activity, with recurrent episodes since admission. Study is being performed to rule out encephalopathic state as well as to rule out seizure activity. Recurrent pseudoseizures also a consideration.  Indications for study:  Rule out encephalopathy; rule out seizures versus pseudoseizures.  Technique: This is an 18 channel routine scalp EEG performed at the bedside with bipolar and monopolar montages arranged in accordance to the international 10/20 system of electrode placement.   Description: patient was minimally responsive to verbal and tactile sensation at the time of this study. Predominant activity consisted of low amplitude diffuse 1-2 Hz delta activity with superimposed low amplitude fast beta activity diffusely. Photic stimulation was not performed. Patient showed no evidence of sleeping state during the EEG recording. No epileptiform discharges were recorded. There was no abnormal slowing of cerebral activity.  Interpretation: this EEG study showed minimal generalized slowing of cerebral activity which is likely due to sedating medication given earlier for seizure-like activity. There was no evidence of an acute encephalopathic state, nor evidence of seizure activity recorded.   Venetia MaxonR Belal Scallon M.D. [facility] [phone]

## 2016-01-20 NOTE — ED Notes (Signed)
MD at bedside. 

## 2016-01-20 NOTE — Significant Event (Signed)
Rapid Response Event Note  Overview: Time Called: 1950 Arrival Time: 1952 Event Type: Neurologic  Initial Focused Assessment: Seizure like activity   Interventions: Physical exam, IV Keppra, SDU order, and EEG  Plan of Care (if not transferred):  Event Summary:  Called to assist with care of patient having an active seizure. On arrival, patient was lying supine in the bed facing to the right side. Skin was warm and dry. Heart sound regular but tachycardiac 120-140. Patient began having another witness seizure that last about 5-8 seconds. Airway was patent. Seizure activity appeared more on the right side. Pupils were dilated. A few minutes after seizure, patient open her eyes and was about to answer a few questions. No facial twitching, incontinence, labored breathing, diaphoresis, confusion, oral trauma, or any signs of postictal symptoms. Dr. Roseanne RenoStewart and Langston MaskerKaren Sofia, [facility] was made aware of patient condition. Orders received for Keppra, EEG and SDU. We will continue to assist with care as needed.    Alicia [name], Alicia [name]

## 2016-01-20 NOTE — ED Notes (Signed)
Pt lethargic, arousable to stimulus/voice, semi-cooperative, interactive, calmer, follows some commands, answers some questions, NAD at this time, 96% RA, no dyspnea noted[facility] explained. Pt to MRI via stretcher. VSS.

## 2016-01-20 NOTE — ED Notes (Signed)
Pt NPO d/t failed swallow screen, fluid challenge order delayed

## 2016-01-20 NOTE — ED Notes (Signed)
Pt boyfriend Viann FishJohn Coonts has taken possession of pts earrings.

## 2016-01-20 NOTE — ED Triage Notes (Signed)
Per [facility]: Patient to ED from home c/o new, sudden onset seizure activity - per pt's SO, she woke up sitting in bed and stated that her chest was hurting then had 4-5 minutes of full body convulsions/grand-mal seizure activity (NO HX of seizures). Per EMS, pt appeared to have 2 focal seizures en route - woke up a few times on her own, was very confused, couldn't squeeze with L hand, nor both feet, and had minimal squeeze with R hand. Unresponsive since, airway intact, resp e/u. EMS VS: 110/60, HR 70 initially, increased to 110 en route, CBG 104, 98% RA.

## 2016-01-20 NOTE — ED Notes (Addendum)
Mentions HA and nausea when asked. Pt passively reluctant and resistant with exam.

## 2016-01-20 NOTE — Consult Note (Signed)
Admission H&P   Chief Complaint:  Altered mental status seizure-like activity.  HPI: Alicia [name] is an 45 y.o. female with a history of hyperlipidemia, GERD, irritable bowel syndrome, risks leg syndrome, depression and anxiety, presenting with altered mental status with seizure like activity. Patient reportedly was sitting up in bed after waking up and complaining of chest pain followed by generalized convulsions and unresponsiveness. She has no previous history of seizure activity. She reportedly had 2 episodes of apparent seizure activity in route to the hospital. She appear to have reduced movement of left upper extremity compared to right upper extremity. Code stroke was activated. CT scan of the head was obtained which was unremarkable. No further seizure-like activity occurred in the ED. No focal deficits were noted on exam. She was afebrile. WBC count was 12.3. BUN slightly elevated at 24. Urinalysis and urine drug screen are pending.   Past Medical History:  Diagnosis Date  . Anemia   . Anxiety   . Depression   . GERD (gastroesophageal reflux disease)   . Hyperlipidemia   . IBS (irritable bowel syndrome)   . Restless leg syndrome     Past Surgical History:  Procedure Laterality Date  . ABDOMINOPLASTY  Feb. 2015  . BREAST SURGERY Bilateral December 2015   Breast Reduction  . CESAREAN SECTION  2011  . CYSTOSCOPY  02/02/2015   Procedure: CYSTOSCOPY;  Surgeon: Gae Dry, MD;  Location: [facility];  Service: Gynecology;;  . Brigitte Pulse AND CURETTAGE OF UTERUS  2003, 2005, 2008  . HERNIA REPAIR  [id number]   Umbilical  . KNEE SURGERY Right 2006   Knee Arthroscopy  . LAPAROSCOPIC BILATERAL SALPINGECTOMY Bilateral 02/02/2015   Procedure: LAPAROSCOPIC BILATERAL SALPINGECTOMY;  Surgeon: Gae Dry, MD;  Location: [facility];  Service: Gynecology;  Laterality: Bilateral;  . LAPAROSCOPIC HYSTERECTOMY N/A 02/02/2015   Procedure: HYSTERECTOMY TOTAL LAPAROSCOPIC;  Surgeon: Gae Dry, MD;  Location: [facility];  Service: Gynecology;  Laterality: N/A;    Family History  Problem Relation Age of Onset  . Hypertension Mother   . Hyperlipidemia Mother   . Heart Problems Father     hole in heart  . Prostate cancer Maternal Grandfather    Social History:  reports that she has never smoked. She has never used smokeless tobacco. She reports that she does not drink alcohol or use drugs.  Allergies:  Allergies  Allergen Reactions  . Azithromycin Diarrhea  . Peanuts [Peanut Oil] Swelling    "swelling of lips"   . Penicillins Hives and Swelling    Medications: Outpatient medications were reviewed by me.  ROS: Unavailable due to patient's altered mental status.  Physical Examination: Blood pressure 107/72, pulse 115, temperature 98 F (36.7 C), temperature source Oral, resp. rate 19, height '5\' 8"'  (1.727 m), weight 74.1 kg (163 lb 5.8 oz), last menstrual period 01/15/2015, SpO2 98 %.  HEENT-  Normocephalic, no lesions, without obvious abnormality.  Normal external eye and conjunctiva.  Normal TM's bilaterally.  Normal auditory canals and external ears. Normal external nose, mucus membranes and septum.  Normal pharynx. Neck supple with no masses, nodes, nodules or enlargement. Cardiovascular - tachycardic with regular rhythm, normal S1 and S2, no murmur or gallop Lungs - chest clear, no wheezing, rales, normal symmetric air entry Abdomen - soft, non-tender; bowel sounds normal; no masses,  no organomegaly Extremities - no joint deformities, effusion, or inflammation and no edema  Neurologic Examination: Mental Status: Slightly lethargic, oriented(?), no acute distress.  Minimal  speech output. Able to follow commands, but slowly and deliberately. Cranial Nerves: II-Visual fields were normal. III/IV/VI-Pupils were equal and reacted normally to light. Extraocular movements were full and conjugate on right left lateral gaze.    V/VII-no facial numbness and no facial  weakness. VIII-normal. X-low volume of speech with no dysarthria. XI: trapezius strength/neck flexion strength normal bilaterally XII-midline tongue extension with normal strength. Motor: 5/5 bilaterally with normal tone and bulk Sensory: Normal throughout. Deep Tendon Reflexes: 2+ and symmetric. Plantars: Mute bilaterally Cerebellar: Normal finger-to-nose testing. Carotid auscultation: Normal  Results for orders placed or performed during the hospital encounter of 01/20/16 (from the past 48 hour(s))  Basic metabolic panel - if new onset seizures     Status: Abnormal   Collection Time: 01/20/16 12:26 AM  Result Value Ref Range   Sodium 136 135 - 145 mmol/L   Potassium 4.3 3.5 - 5.1 mmol/L   Chloride 102 101 - 111 mmol/L   CO2 24 22 - 32 mmol/L   Glucose, Bld 117 (H) 65 - 99 mg/dL   BUN 19 6 - 20 mg/dL   Creatinine, Ser 0.86 0.44 - 1.00 mg/dL   Calcium 8.9 8.9 - 10.3 mg/dL   GFR calc non Af Amer >60 >60 mL/min   GFR calc Af Amer >60 >60 mL/min    Comment: (NOTE) The eGFR has been calculated using the CKD EPI equation. This calculation has not been validated in all clinical situations. eGFR's persistently <60 mL/min signify possible Chronic Kidney Disease.    Anion gap 10 5 - 15  CBC - if new onset seizures     Status: Abnormal   Collection Time: 01/20/16 12:26 AM  Result Value Ref Range   WBC 12.3 (H) 4.0 - 10.5 K/uL   RBC 4.70 3.87 - 5.11 MIL/uL   Hemoglobin 14.6 12.0 - 15.0 g/dL   HCT 41.5 36.0 - 46.0 %   MCV 88.3 78.0 - 100.0 fL   MCH 31.1 26.0 - 34.0 pg   MCHC 35.2 30.0 - 36.0 g/dL   RDW 13.5 11.5 - 15.5 %   Platelets 263 150 - 400 K/uL  CBG monitoring, ED     Status: Abnormal   Collection Time: 01/20/16 12:36 AM  Result Value Ref Range   Glucose-Capillary 115 (H) 65 - 99 mg/dL  Protime-INR     Status: None   Collection Time: 01/20/16 12:43 AM  Result Value Ref Range   Prothrombin Time 12.4 11.4 - 15.2 seconds   INR 0.92   APTT     Status: None   Collection  Time: 01/20/16 12:43 AM  Result Value Ref Range   aPTT 28 24 - 36 seconds  I-stat troponin, ED     Status: None   Collection Time: 01/20/16 12:47 AM  Result Value Ref Range   Troponin i, poc 0.00 0.00 - 0.08 ng/mL   Comment 3            Comment: Due to the release kinetics of cTnI, a negative result within the first hours of the onset of symptoms does not rule out myocardial infarction with certainty. If myocardial infarction is still suspected, repeat the test at appropriate intervals.   Ethanol     Status: None   Collection Time: 01/20/16 12:47 AM  Result Value Ref Range   Alcohol, Ethyl (B) <5 <5 mg/dL    Comment:        LOWEST DETECTABLE LIMIT FOR SERUM ALCOHOL IS 5 mg/dL FOR MEDICAL PURPOSES ONLY  I-Stat Chem 8, ED     Status: Abnormal   Collection Time: 01/20/16 12:49 AM  Result Value Ref Range   Sodium 139 135 - 145 mmol/L   Potassium 4.3 3.5 - 5.1 mmol/L   Chloride 102 101 - 111 mmol/L   BUN 26 (H) 6 - 20 mg/dL   Creatinine, Ser 0.90 0.44 - 1.00 mg/dL   Glucose, Bld 119 (H) 65 - 99 mg/dL   Calcium, Ion 1.10 (L) 1.15 - 1.40 mmol/L   TCO2 28 0 - 100 mmol/L   Hemoglobin 13.9 12.0 - 15.0 g/dL   HCT 41.0 36.0 - 46.0 %  Hepatic function panel     Status: Abnormal   Collection Time: 01/20/16 12:52 AM  Result Value Ref Range   Total Protein 6.5 6.5 - 8.1 g/dL   Albumin 3.9 3.5 - 5.0 g/dL   AST 28 15 - 41 U/L   ALT 24 14 - 54 U/L   Alkaline Phosphatase 73 38 - 126 U/L   Total Bilirubin 0.5 0.3 - 1.2 mg/dL   Bilirubin, Direct 0.3 0.1 - 0.5 mg/dL   Indirect Bilirubin 0.2 (L) 0.3 - 0.9 mg/dL   Ct Head Code Stroke W/o Cm  Result Date: 01/20/2016 CLINICAL DATA:  Code stroke. Initial evaluation for acute left-sided weakness status post seizure. EXAM: CT HEAD WITHOUT CONTRAST TECHNIQUE: Contiguous axial images were obtained from the base of the skull through the vertex without intravenous contrast. COMPARISON:  None. FINDINGS: Brain: Cerebral volume within normal limits.  Gray-white matter differentiation maintained. No acute intracranial hemorrhage. No evidence for acute large vessel territory infarct. No mass lesion, midline shift, or mass effect. No hydrocephalus. No extra-axial fluid collection. Vascular: No hyperdense vessel. Skull: Scalp soft tissues within normal limits.  Calvarium intact. Sinuses/Orbits: Globes and orbital soft tissues within normal limits. Paranasal sinuses are clear. No mastoid effusion. Other: Negative ASPECTS (Ider Stroke Program Early CT Score) - Ganglionic level infarction (caudate, lentiform nuclei, internal capsule, insula, M1-M3 cortex): 7 - Supraganglionic infarction (M4-M6 cortex): 3 Total score (0-10 with 10 being normal): 10 IMPRESSION: 1. No acute intracranial process identified. 2. ASPECTS is 10 Critical Value/emergent results were called by telephone at the time of interpretation on 01/20/2016 at 1:40 am to Dr. Ezequiel Essex , who verbally acknowledged these results. For discussion with Dr. Pilar Plate or, these results will also be conveyed to the stroke neurologist, Dr. Nicole Kindred as well. Electronically Signed   By: Jeannine Boga M.D.   On: 01/20/2016 01:43    Assessment/Plan 45 year old lady with no history of seizure disorder presenting with new onset seizure-like activity as well as altered mental status. No further seizure activity has occurred since arriving in the ED. She has no focal deficits on exam and CT scan of her head was unremarkable. Cannot rule out psychophysiologic factors contributing to her symptomatology.  Recommendations: 1. If patient's mental status returned to baseline, admitted for observation and workup for possible new onset seizure disorder, including MRI of the brain without and with contrast, and EEG. 2. If mental status returns to baseline, she may be evaluated outpatient neurology for seizure workup 3. No anticonvulsant medication is indicated at this point. 4. If admitted, we will continue to  follow this patient with you.  C.R. Nicole Kindred, MD [facility] [phone]  01/20/2016, 1:56 AM

## 2016-01-20 NOTE — ED Notes (Signed)
[facility]

## 2016-01-20 NOTE — ED Notes (Signed)
RN noticed pt's HR increase to 140 on monitor, went into room to find patient attempting to get out of bed, stating she had to pee, while her boyfriend was trying to get her to stay in bed. Patient repeatedly saying that she wanted to pee and I along with 2 other RNs helped her onto bedpan. Pt remained uncooperative and tried to get out of bed while sitting on bedpan. Stated that she could go ahead and pee, but pt sts she that she can't. MD made aware.

## 2016-01-20 NOTE — Progress Notes (Addendum)
Observe two new shakiness about 10-15 minutes      apart lasting 20- 30 seconds.  The shakiness looks like seizure activity. Patient dad requested that I paged neurology. Neurology was called and demographic and attending MD information was given as well as history of present illness. Awaiting neurology to stop by. Charge nurse came in as well to answer some questions

## 2016-01-20 NOTE — Progress Notes (Signed)
   01/20/16 1636  Vitals  BP 121/75  BP Location Left Arm  BP Method Automatic  Patient Position (if appropriate) Lying  Pulse Rate (!) 115  Pulse Rate Source Monitor  Resp 16  Oxygen Therapy  SpO2 98 %  O2 Device Room Air   I was paged to the patient. Family member reported that the patient is having chest pain. patient dad and boyfriend was holding her on the side of the bed. Charge RN was called with a NT. Patient start shaking and was unconscious for about 30 seconds. She was lay back to the bed by Staff. Vital Sign was check after seizure and 12 lead EKG was completed. Dr Adrian BlackwaterStinson, J. was noticed and no further order was given. Above Vital sign is after seizure.

## 2016-01-20 NOTE — Progress Notes (Signed)
STAT EEG performed and complete. Overnight video LTM EEG started per Dr Roseanne RenoStewart for evaluation of possible ongoing seizures.

## 2016-01-21 DIAGNOSIS — R4182 Altered mental status, unspecified: Secondary | ICD-10-CM

## 2016-01-21 DIAGNOSIS — F445 Conversion disorder with seizures or convulsions: Secondary | ICD-10-CM | POA: Diagnosis not present

## 2016-01-21 DIAGNOSIS — Z79899 Other long term (current) drug therapy: Secondary | ICD-10-CM | POA: Diagnosis not present

## 2016-01-21 DIAGNOSIS — D72829 Elevated white blood cell count, unspecified: Secondary | ICD-10-CM

## 2016-01-21 DIAGNOSIS — R569 Unspecified convulsions: Secondary | ICD-10-CM | POA: Diagnosis not present

## 2016-01-21 DIAGNOSIS — G2581 Restless legs syndrome: Secondary | ICD-10-CM

## 2016-01-21 DIAGNOSIS — G894 Chronic pain syndrome: Secondary | ICD-10-CM | POA: Diagnosis not present

## 2016-01-21 LAB — MRSA PCR SCREENING: MRSA by PCR: NEGATIVE

## 2016-01-21 MED ORDER — SODIUM CHLORIDE 0.9 % IV SOLN
500.0000 mg | Freq: Two times a day (BID) | INTRAVENOUS | Status: DC
  Filled 2016-01-21 (×2): qty 5

## 2016-01-21 NOTE — Procedures (Addendum)
LTM-EEG Report  HISTORY: Continuous video-EEG monitoring performed for 45 year old with altered mental status and recurrent seizure-like episodes, cEEG to characterize events.  ACQUISITION: International 10-20 system for electrode placement; 18 channels with additional eyes linked to ipsilateral ears and EKG. Additional T1-T2 electrodes were used. Continuous video recording obtained.   EEG NUMBER:   MEDICATIONS: Day 1: Xanax, Keppra  DAY #1: from 2221 01/20/16 to 0933 01/21/16   BACKGROUND: An overall low voltage continuous recording with good spontaneous variability and [] reactivity. Waking background consisted of a low voltage 9-10 Hz posterior dominant rhythm bilaterally with low voltage beta activity in the bilateral frontocentral regions and some low voltage theta activity diffusely. Sleep was captured with normal stage II sleep architecture.   EPILEPTIFORM/PERIODIC ACTIVITY: none  SEIZURES: none EVENTS: There were 4 clinical events reported by family during this initial recording period. On video, the patient appeared restless with some non-rhythmic movements of the legs. She was not clearly responsive to family during some of these events. One event in the morning hours depicted some non-rhythmic shaking of the head, arms, and legs. There was a normal waking EEG background with this events with no epileptiform discharges. EKG: no significant arrhythmia  SUMMARY: This was a normal continuous video EEG with no epileptiform discharges or seizures. There were 4 clinical events of restlessness, leg movements, or head and leg shaking identified without an electrographic correlate.

## 2016-01-21 NOTE — Progress Notes (Signed)
LTM EEG D/C'd per Dr Roxy Mannsster. No skin breakdown noted. No complaints or concerns from the patient or her family in room.

## 2016-01-21 NOTE — Discharge Summary (Signed)
Physician Discharge Summary  Alicia HornsDorothy V Rinehart ZOX:[id number] DOB: Oct 09, 1970 DOA: 01/20/2016  PCP: Ruel FavorsKrichna F Sowles, MD  Admit date: 01/20/2016 Discharge date: 01/21/2016  Time spent: 35 minutes  Recommendations for Outpatient Follow-up:  Psychogenic Nonepileptic seizure -Per Dr.Timothy Genevie AnnJames Oster, Neurology; Nonepileptic seizures (NES): Continuous EEG confirms that her spells are not epileptic in nature. The EEG has been discontinued. he will stop her Keppra. She is not to receive any further Ativan for these spells and this has been d/w her RN. Her father and fiance are at the bedside and he had a long discussion with them about nonepileptic seizures.  -Father Mr. Yetta BarreRinehart confirmed that he would be staying with his daughter to ensure her safety, as well as her older son would be present. States he understands the trigger speaking with Dr.Timothy Genevie AnnJames Oster, Neurology.  -Father assures me patient has a therapist which she will follow-up with  Altered mental status -Scheduled follow-up in one week with Dr. Read DriversKirchna Sowles altered mental status, chronic pain syndrome, restless leg syndrome. Consider tapering off benzodiazepine  Chronic pain syndrome -Patient to continue her outpatient pain medication regimen. -PCP to monitor use of pain medication. -Father counseled on minimizing narcotics and benzodiazepines:  HLD  -Currently not on medication -PCP to decide when/if to start medication  HTN -Controlled not on medication:PCP to decide when/if to start medication  Restless leg syndrome -Controlled not on medication:PCP to decide when/if to start medication  Seizures -Per neurology workup symptoms not consistent with seizure activity.  -Resolved  Chronic prescription benzodiazepine use -PCP to decide when/if to start medication     Discharge Diagnoses:  Principal Problem:   Altered mental status Active Problems:   Seizure (HCC)   Chronic prescription benzodiazepine  use   Leukocytosis   Seizure-like activity (HCC)   Discharge Condition: Stable  Diet recommendation: Regular  Filed Weights   01/20/16 0040 01/20/16 2219  Weight: 74.1 kg (163 lb 5.8 oz) 87.5 kg (192 lb 14.4 oz)    History of present illness:  [age] y.o.WF PMHx Anxiety and Depression, Chronic pain syndrome, Chronic NSAID use,  GERD, hyperlipidemia, IBS, Restless leg syndrome,.   Patient is hypersomnolent due to altered mental status and medications given in the emergency department and for MRI, therefore the patient is nonconversant at this point and history is obtained by the chart. Patient was brought to the emergency department by EMS after having 2-3self-limiting, generalized convulsions and unresponsiveness. The first one started at around approximately 11:15 PM last night and lasted for about 4-5 minutes. The patient had 2 other episodes with EMS in route to the hospital. On arrival, she appeared to have reduced movement of her left upper extremity compared to her right Verlee RossettiUEstroke was activated. CT of her head was normal, as was her MRI. Patient did receive 2 mg of Ativan prior to the MRI and 2mg more for alcohol/benzodiazepine withdrawal prophylaxis.  Hospitalization patient received workup by neurology for seizures. Workup revealed patient's symptoms were not consistent with seizure activity. Patient's symptoms most consistent with pseudoseizures. Father confirmed that patient has history of mental health issues and has been seeing a mental health provider.   Procedures: 12/2 MRI brain: Normal 12/2 CT angiogram PE protocol: Negative for PE.-Bibasilar atelectasis 12/30 Continuous EEG: Negative for seizures  Consultations: Neurology      Discharge Exam: Vitals:   01/21/16 0700 01/21/16 0800 01/21/16 1155 01/21/16 1200  BP: 98/60 106/72 (!) 96/47 (!) 94/49  Pulse: 96 98 93 92  Resp: 16 15 15  14  Temp: 97.3 F (36.3 C)  99.3 F (37.4 C)   TempSrc: Axillary  Axillary    SpO2: 99% 100% 99% 100%  Weight:      Height:        General: A/O 4, NAD Cardiovascular: Irregular rhythm and rate, negative murmurs rubs gallops, normal S1/S2 Respiratory: Clear to auscultation bilateral  Discharge Instructions     Medication List    TAKE these medications   ALPRAZOLAM XR 1 MG 24 hr tablet Generic drug:  ALPRAZolam Take 1 tablet (1 mg total) by mouth daily. What changed:  Another medication with the same name was removed. Continue taking this medication, and follow the directions you see here.   atenolol 25 MG tablet Commonly known as:  TENORMIN Take 0.5-1 tablets (12.5-25 mg total) by mouth daily.   CENTRUM PO Take 1 tablet by mouth daily. Reported on 02/02/2015   cyclobenzaprine 10 MG tablet Commonly known as:  FLEXERIL Take 1 tablet (10 mg total) by mouth daily.   pantoprazole 40 MG tablet Commonly known as:  PROTONIX Take 1 tablet (40 mg total) by mouth daily.   PARoxetine 20 MG tablet Commonly known as:  PAXIL Take 20 mg by mouth daily. What changed:  Another medication with the same name was removed. Continue taking this medication, and follow the directions you see here.   ranitidine 300 MG tablet Commonly known as:  ZANTAC Take 0.5 tablets (150 mg total) by mouth 2 (two) times daily.   zolpidem 10 MG tablet Commonly known as:  AMBIEN Take 1 tablet (10 mg total) by mouth at bedtime as needed. What changed:  reasons to take this      Allergies  Allergen Reactions  . Azithromycin Diarrhea  . Peanuts [Peanut Oil] Swelling    "swelling of lips"   . Penicillins Hives and Swelling    Has patient had a PCN reaction causing immediate rash, facial/tongue/throat swelling, SOB or lightheadedness with hypotension: Yes Has patient had a PCN reaction causing severe rash involving mucus membranes or skin necrosis: No Has patient had a PCN reaction that required hospitalization No Has patient had a PCN reaction occurring within the last 10  years: No If all of the above answers are "NO", then may proceed with Cephalosporin use.      The results of significant diagnostics from this hospitalization (including imaging, microbiology, ancillary and laboratory) are listed below for reference.    Significant Diagnostic Studies: Mr Laqueta Jean And Wo Contrast  Result Date: 01/20/2016 CLINICAL DATA:  Initial evaluation for acute seizure, left-sided weakness. EXAM: MRI HEAD WITHOUT AND WITH CONTRAST TECHNIQUE: Multiplanar, multiecho pulse sequences of the brain and surrounding structures were obtained without and with intravenous contrast. CONTRAST:  15mL MULTIHANCE GADOBENATE DIMEGLUMINE 529 MG/ML IV SOLN COMPARISON:  Prior CT from earlier the same day. FINDINGS: Brain: Study degraded by motion artifact and patient positioning. Cerebral volume within normal limits for age. No significant cerebral white matter disease. No abnormal foci of restricted diffusion to suggest acute or subacute ischemia. Gray-white matter differentiation maintained. No evidence for acute or chronic intracranial hemorrhage. No encephalomalacia to suggest chronic infarction. No mass lesion, midline shift or mass effect. No hydrocephalus. No extra-axial fluid collection. No intrinsic temporal lobe abnormality. No abnormal enhancement. Major dural sinuses are patent. Pituitary gland and suprasellar region within normal limits. Midline structures intact and normal. Vascular: Major intracranial vascular flow voids are well preserved and normal. Skull and upper cervical spine: Craniocervical junction within normal limits. Visualized upper cervical  spine normal. Bone marrow signal intensity within normal limits. No scalp soft tissue abnormality. Sinuses/Orbits: Globes and orbital soft tissues within normal limits. Paranasal sinuses are clear. No significant mastoid effusion. Inner ear structures grossly normal. IMPRESSION: Normal brain MRI.  No acute intracranial process identified.  Electronically Signed   By: Rise Mu M.D.   On: 01/20/2016 06:38   Ct Angio Chest/abd/pel For Dissection W And/or Wo Contrast  Result Date: 01/20/2016 CLINICAL DATA:  Acute onset of generalized chest pain and left-sided weakness. Initial encounter. EXAM: CT ANGIOGRAPHY CHEST, ABDOMEN AND PELVIS TECHNIQUE: Multidetector CT imaging through the chest, abdomen and pelvis was performed using the standard protocol during bolus administration of intravenous contrast. Multiplanar reconstructed images and MIPs were obtained and reviewed to evaluate the vascular anatomy. CONTRAST:  100 mL of Isovue 370 IV contrast COMPARISON:  CTA of the chest performed 06/01/2005 FINDINGS: CTA CHEST FINDINGS Cardiovascular: There is no evidence of significant pulmonary embolus. The heart is unremarkable in appearance. No calcific atherosclerotic disease is seen. There is no evidence of aortic dissection. There is no evidence of aneurysmal dilatation. The great vessels are unremarkable in appearance. Mediastinum/Nodes: Trace pericardial fluid remains within normal limits. No mediastinal lymphadenopathy is seen. The visualized portions of the thyroid gland are unremarkable. No axillary lymphadenopathy is appreciated. Lungs/Pleura: Bibasilar atelectasis is noted. No pleural effusion or pneumothorax is seen. No masses are identified. Musculoskeletal: No acute osseous abnormalities are identified. The visualized musculature is unremarkable in appearance. Review of the MIP images confirms the above findings. CTA ABDOMEN AND PELVIS FINDINGS VASCULAR Aorta: There is no evidence for aortic dissection. There is no evidence of aneurysmal dilatation. No calcific atherosclerotic disease is seen. Celiac: The celiac trunk remains intact. SMA: The superior mesenteric artery is unremarkable in appearance. Renals: The renal arteries are intact bilaterally. IMA: The inferior mesenteric artery remains intact. Inflow: The common, external and  internal iliac arteries are grossly unremarkable. The common femoral arteries and their proximal branches are grossly unremarkable. Veins: The venous vasculature is grossly unremarkable. The inferior vena cava tracks to the left of the abdominal aorta, and is unremarkable in appearance. Review of the MIP images confirms the above findings. NON-VASCULAR Hepatobiliary: The liver is unremarkable in appearance. The gallbladder is unremarkable in appearance. The common bile duct remains normal in caliber. Pancreas: The pancreas is within normal limits. Spleen: The spleen is unremarkable in appearance. Adrenals/Urinary Tract: The adrenal glands are unremarkable in appearance. The kidneys are within normal limits. There is no evidence of hydronephrosis. No renal or ureteral stones are identified. No perinephric stranding is seen. Stomach/Bowel: The stomach is unremarkable in appearance. The small bowel is within normal limits. The appendix is normal in caliber, without evidence of appendicitis. The colon is unremarkable in appearance. Lymphatic: No retroperitoneal or pelvic sidewall lymphadenopathy is seen. Reproductive: The bladder is mildly distended and grossly unremarkable. The patient is status post hysterectomy. The ovaries are grossly symmetric. No suspicious adnexal masses are seen. Other: No additional soft tissue abnormalities are seen. Musculoskeletal: No acute osseous abnormalities are identified. The visualized musculature is unremarkable in appearance. Review of the MIP images confirms the above findings. IMPRESSION: 1. No evidence of significant pulmonary embolus. 2. No evidence of aortic dissection. No evidence of aneurysmal dilatation. No calcific atherosclerotic disease seen. 3. Bibasilar atelectasis noted.  Lungs otherwise clear. Electronically Signed   By: Roanna Raider M.D.   On: 01/20/2016 02:43   Ct Head Code Stroke W/o Cm  Result Date: 01/20/2016 CLINICAL DATA:  Code stroke. Initial evaluation  for acute left-sided weakness status post seizure. EXAM: CT HEAD WITHOUT CONTRAST TECHNIQUE: Contiguous axial images were obtained from the base of the skull through the vertex without intravenous contrast. COMPARISON:  None. FINDINGS: Brain: Cerebral volume within normal limits. Gray-white matter differentiation maintained. No acute intracranial hemorrhage. No evidence for acute large vessel territory infarct. No mass lesion, midline shift, or mass effect. No hydrocephalus. No extra-axial fluid collection. Vascular: No hyperdense vessel. Skull: Scalp soft tissues within normal limits.  Calvarium intact. Sinuses/Orbits: Globes and orbital soft tissues within normal limits. Paranasal sinuses are clear. No mastoid effusion. Other: Negative ASPECTS ([place]berta Stroke Program Early CT Score) - Ganglionic level infarction (caudate, lentiform nuclei, internal capsule, insula, M1-M3 cortex): 7 - Supraganglionic infarction (M4-M6 cortex): 3 Total score (0-10 with 10 being normal): 10 IMPRESSION: 1. No acute intracranial process identified. 2. ASPECTS is 10 Critical Value/emergent results were called by telephone at the time of interpretation on 01/20/2016 at 1:40 am to Dr. Glynn OctaveSTEPHEN RANCOUR , who verbally acknowledged these results. For discussion with Dr. Homero FellersFrank or, these results will also be conveyed to the stroke neurologist, Dr. Roseanne RenoStewart as well. Electronically Signed   By: Rise MuBenjamin  McClintock M.D.   On: 01/20/2016 01:43    Microbiology: Recent Results (from the past 240 hour(s))  MRSA PCR Screening     Status: None   Collection Time: 01/21/16  2:19 AM  Result Value Ref Range Status   MRSA by PCR NEGATIVE NEGATIVE Final    Comment:        The GeneXpert MRSA Assay (FDA approved for NASAL specimens only), is one component of a comprehensive MRSA colonization surveillance program. It is not intended to diagnose MRSA infection nor to guide or monitor treatment for MRSA infections.      Labs: Basic Metabolic  Panel:  Recent Labs Lab 01/20/16 0026 01/20/16 0049  NA 136 139  K 4.3 4.3  CL 102 102  CO2 24  --   GLUCOSE 117* 119*  BUN 19 26*  CREATININE 0.86 0.90  CALCIUM 8.9  --    Liver Function Tests:  Recent Labs Lab 01/20/16 0052  AST 28  ALT 24  ALKPHOS 73  BILITOT 0.5  PROT 6.5  ALBUMIN 3.9   No results for input(s): LIPASE, AMYLASE in the last 168 hours. No results for input(s): AMMONIA in the last 168 hours. CBC:  Recent Labs Lab 01/20/16 0026 01/20/16 0049 01/20/16 0749  WBC 12.3*  --  10.9*  HGB 14.6 13.9 13.0  HCT 41.5 41.0 38.4  MCV 88.3  --  88.5  PLT 263  --  222   Cardiac Enzymes:  Recent Labs Lab 01/20/16 0341  TROPONINI <0.03   BNP: BNP (last 3 results) No results for input(s): BNP in the last 8760 hours.  ProBNP (last 3 results) No results for input(s): PROBNP in the last 8760 hours.  CBG:  Recent Labs Lab 01/20/16 0036 01/20/16 0623 01/20/16 1959  GLUCAP 115* 119* 98       Signed:  Carolyne Littlesurtis Cinzia Devos, MD [facility] [phone] pager

## 2016-01-21 NOTE — Progress Notes (Signed)
Pt education completed to include future appointments, current prescriptions and medications, and doctors discharge instructions. Pt alert and oriented, vital signs stable. Pt discharged with nursing staff and father via wheel chair. Temp: 99.3 F (37.4 C) (12/03 1155) Temp Source: Axillary (12/03 1155) BP: 109/73 (12/03 1300) Pulse Rate: 126 (12/03 1300)  Jessica PriestSarah E Madysin Crisp 01/21/2016 3:27 PM

## 2016-01-21 NOTE — Telephone Encounter (Signed)
Please contact family and see how she is doing. Thank you

## 2016-01-21 NOTE — Progress Notes (Signed)
Neurology Progress Note  Subjective: Chart reviewed.  In brief, this is a 45-yo woman who was brought to the ED in the early morning hours of 01/20/16 after having seizure-like activity at home. Her fiance reported that the patient sat up in bed, c/o chest pain, then had generalized convulsive activity followed by unresponsiveness. EMS was called and reported two additional episodes of sz-like activity en route to hospital followed by apparent decreased movement of the L side which prompted them to activate CODE STROKE.   She was seen by neurology on-call (Dr. Roseanne RenoStewart) in the ED. He did not feel that her presentation was suggestive of stroke and documented her to be lethargic with minimal speech output but able to follow commands with a normal elemental neuro exam. He felt that there was likely a functional component to her presentation and recommended MRI brain and EEG. The patient was admitted to the floor. MRI was normal.   Yesterday afternoon while on the floor, she was noted to have several sz-like episodes. Notes have been reviewed. The first was apparently preceded by chest pain. When the RN came to check on her the patient "start [sic] shaking and was unconscious for about 30 seconds." She continued to have shaking episodes about 10-15 minutes apart lasting about 30 seconds each. Per the attending MD note, RN reported that the patient was unresponsive with the episodes yet verbalizing pain during them. RRT responded and witnessed an episode that lasted 5-8 seconds that was felt to involve the right side more than the left. Shortly after the seizure she was able to answer questions with "No facial twitching, incontinence, labored breathing, diaphoresis, confusion, oral trauma, or any signs of postictal symptoms." Neurology assessed the patient and ordered Keppra and EEG. Routine EEG was normal and showed no postictal slowing or epileptiform activity. Continuous video EEG was performed overnight and  captured several spells, none of which was associated with any electrical correlate on the EEG. She had another spell this morning and was given Ativan 2 mg shortly before my assessment. She has been sleeping since getting the Ativan.   Current Meds:   Current Facility-Administered Medications:  .  0.9 %  sodium chloride infusion, 75 mL/hr, Intravenous, Continuous, Rhona RaiderJacob J Stinson, DO, Last Rate: 75 mL/hr at 01/21/16 0000, 75 mL/hr at 01/21/16 0000 .  acetaminophen (TYLENOL) tablet 650 mg, 650 mg, Oral, Q4H PRN **OR** acetaminophen (TYLENOL) suppository 650 mg, 650 mg, Rectal, Q4H PRN, Rhona RaiderJacob J Stinson, DO, 650 mg at 01/20/16 1815 .  atenolol (TENORMIN) tablet 12.5-25 mg, 12.5-25 mg, Oral, Daily, Rhona RaiderJacob J Stinson, DO .  enoxaparin (LOVENOX) injection 40 mg, 40 mg, Subcutaneous, Q24H, Rhona RaiderJacob J Stinson, DO, 40 mg at 01/20/16 1749 .  famotidine (PEPCID) tablet 20 mg, 20 mg, Oral, BID, Rhona RaiderJacob J Stinson, DO .  folic acid (FOLVITE) tablet 1 mg, 1 mg, Oral, Daily, Glynn OctaveStephen Rancour, MD .  ibuprofen (ADVIL,MOTRIN) tablet 600 mg, 600 mg, Oral, Q6H PRN, Rhona RaiderJacob J Stinson, DO .  levETIRAcetam (KEPPRA) 500 mg in sodium chloride 0.9 % 100 mL IVPB, 500 mg, Intravenous, Q12H, Drema Dallasurtis J Woods, MD .  LORazepam (ATIVAN) injection 0-4 mg, 0-4 mg, Intravenous, Q6H, 2 mg at 01/21/16 1055 **FOLLOWED BY** [START ON 01/22/2016] LORazepam (ATIVAN) injection 0-4 mg, 0-4 mg, Intravenous, Q12H, Glynn OctaveStephen Rancour, MD .  LORazepam (ATIVAN) tablet 1 mg, 1 mg, Oral, Q6H PRN **OR** LORazepam (ATIVAN) injection 1 mg, 1 mg, Intravenous, Q6H PRN, Glynn OctaveStephen Rancour, MD, 2 mg at 01/20/16 0436 .  multivitamin with  minerals tablet 1 tablet, 1 tablet, Oral, Daily, Glynn Octave, MD .  ondansetron (ZOFRAN) tablet 4 mg, 4 mg, Oral, Q6H PRN **OR** ondansetron (ZOFRAN) injection 4 mg, 4 mg, Intravenous, Q6H PRN, Rhona Raider Stinson, DO .  pantoprazole (PROTONIX) EC tablet 40 mg, 40 mg, Oral, Daily, Rhona Raider Stinson, DO .  PARoxetine (PAXIL) tablet 20 mg, 20  mg, Oral, Daily, Rhona Raider Stinson, DO .  polyethylene glycol (MIRALAX / GLYCOLAX) packet 17 g, 17 g, Oral, Daily PRN, Rhona Raider Stinson, DO .  thiamine (VITAMIN B-1) tablet 100 mg, 100 mg, Oral, Daily **OR** thiamine (B-1) injection 100 mg, 100 mg, Intravenous, Daily, Glynn Octave, MD  Objective:  Temp:  [97.3 F (36.3 C)-98.8 F (37.1 C)] 97.3 F (36.3 C) (12/03 0700) Pulse Rate:  [96-117] 98 (12/03 0800) Resp:  [13-20] 15 (12/03 0800) BP: (98-130)/(50-83) 106/72 (12/03 0800) SpO2:  [97 %-100 %] 100 % (12/03 0800) Weight:  [87.5 kg (192 lb 14.4 oz)] 87.5 kg (192 lb 14.4 oz) (12/02 2219)  General: Sleeping after getting Ativan. Exam deferred.   Labs: Lab Results  Component Value Date   WBC 10.9 (H) 01/20/2016   HGB 13.0 01/20/2016   HCT 38.4 01/20/2016   PLT 222 01/20/2016   GLUCOSE 119 (H) 01/20/2016   CHOL 204 (A) 08/10/2015   TRIG 130 08/10/2015   HDL 42 08/10/2015   LDLCALC 136 08/10/2015   ALT 24 01/20/2016   AST 28 01/20/2016   NA 139 01/20/2016   K 4.3 01/20/2016   CL 102 01/20/2016   CREATININE 0.90 01/20/2016   BUN 26 (H) 01/20/2016   CO2 24 01/20/2016   TSH 0.386 (L) 07/25/2011   INR 0.92 01/20/2016   HGBA1C 5.47 08/10/2015   CBC Latest Ref Rng & Units 01/20/2016 01/20/2016 01/20/2016  WBC 4.0 - 10.5 K/uL 10.9(H) - 12.3(H)  Hemoglobin 12.0 - 15.0 g/dL 16.1 09.6 04.5  Hematocrit 36.0 - 46.0 % 38.4 41.0 41.5  Platelets 150 - 400 K/uL 222 - 263    Lab Results  Component Value Date   HGBA1C 5.47 08/10/2015   Lab Results  Component Value Date   ALT 24 01/20/2016   AST 28 01/20/2016   ALKPHOS 73 01/20/2016   BILITOT 0.5 01/20/2016    Radiology:  I have personally and independently reviewed the MRI brain without contrast from 01/20/16. This is normal.     Other diagnostic studies:  Routine EEG 01/20/16: Minimal generalized slowing c/w sedation.   Continuous EEG 12/2-12/3/17: "This was a normal continuous video EEG with no epileptiform discharges or  seizures. There were 4 clinical events of restlessness, leg movements, or head and leg shaking identified without an electrographic correlate."  A/P:   1. Nonepileptic seizures (NES): Continuous EEG confirms that her spells are not epileptic in nature. The EEG has been discontinued. I will stop her Keppra. She is not to receive any further Ativan for these spells and this has been d/w her RN. Her father and fiance are at the bedside and I had a long discussion with them about nonepileptic seizures.   Her father reports that she has been under extreme amounts of stress for the past several years. Her husband died suddenly one year ago. She has a son with autism who has had frequent issues at school. Another son is away in basic training. She also has substantial stress at work. This is in the setting of a history of anxiety and depression which are common factors in nonepileptic seizures. I  discussed that in many cases, NES are subconscious reactions to extreme stress and that they are usually not volitional in nature. They were concerned that her spells were causing permanent injury to her brain and were reassured that they were not. NES do not affect the body or cause damage unless the individual somehow sustains traumatic injury as a result of the episodes. They were advised that NES are managed by ensuring optimal control of underlying mood disorders such as anxiety and depression coupled with improved stress management. This often requires pharmacologic management of mood disorders and engaging with a therapist for intervention such as cognitive-behavioral therapy. They verbalize understanding and are in agreement with the plan as noted. They were given the opportunity to ask any questions and these were addressed to their satisfaction.    The patient has not engaged with her caregivers much at all but will speak with her family and fiance. I have enlisted her father to speak to her once she is more alert  again. Once she is more alert she can likely go home. Her father thinks that she has a therapist and she will need to see them to address her NES.   I do not have any further recommendations and no further testing is required. I am signing off. Call if any new issues arise.   A total of 40 minutes was spent, greater than 50% of which was spent counseling the patient's father and fiance.   Rhona Leavensimothy Daemon Dowty, MD [facility]

## 2016-01-22 ENCOUNTER — Telehealth: Payer: Self-pay

## 2016-01-22 NOTE — Telephone Encounter (Signed)
Spoke with patient she states she was released last night from Hospital. Also she does not need to see a Neurologist any longer per patient. She is coming in to see Dr. Carlynn PurlSowles on February 06, 2016 and that her seizures stop finally on Sunday night.

## 2016-01-24 ENCOUNTER — Encounter: Payer: Self-pay | Admitting: Family Medicine

## 2016-01-24 ENCOUNTER — Ambulatory Visit (INDEPENDENT_AMBULATORY_CARE_PROVIDER_SITE_OTHER): Payer: BLUE CROSS/BLUE SHIELD | Admitting: Family Medicine

## 2016-01-24 VITALS — BP 106/62 | HR 103 | Temp 98.3°F | Resp 18 | Ht 68.0 in | Wt 171.2 lb

## 2016-01-24 DIAGNOSIS — K219 Gastro-esophageal reflux disease without esophagitis: Secondary | ICD-10-CM | POA: Diagnosis not present

## 2016-01-24 DIAGNOSIS — G4700 Insomnia, unspecified: Secondary | ICD-10-CM | POA: Diagnosis not present

## 2016-01-24 DIAGNOSIS — F411 Generalized anxiety disorder: Secondary | ICD-10-CM

## 2016-01-24 DIAGNOSIS — R000 Tachycardia, unspecified: Secondary | ICD-10-CM

## 2016-01-24 DIAGNOSIS — M542 Cervicalgia: Secondary | ICD-10-CM

## 2016-01-24 DIAGNOSIS — R569 Unspecified convulsions: Secondary | ICD-10-CM | POA: Diagnosis not present

## 2016-01-24 MED ORDER — CYCLOBENZAPRINE HCL 10 MG PO TABS: 10.0000 mg | ORAL_TABLET | Freq: Every day | ORAL | 5 refills | Status: DC

## 2016-01-24 MED ORDER — PANTOPRAZOLE SODIUM 40 MG PO TBEC: 40.0000 mg | DELAYED_RELEASE_TABLET | Freq: Every day | ORAL | 5 refills | Status: DC

## 2016-01-24 MED ORDER — ZOLPIDEM TARTRATE 10 MG PO TABS: 10.0000 mg | ORAL_TABLET | Freq: Every evening | ORAL | 5 refills | Status: DC | PRN

## 2016-01-24 MED ORDER — ATENOLOL 25 MG PO TABS: ORAL_TABLET | ORAL | 5 refills | Status: DC

## 2016-01-24 MED ORDER — PAROXETINE HCL 40 MG PO TABS
40.0000 mg | ORAL_TABLET | Freq: Every day | ORAL | 5 refills | Status: DC
Start: 2016-01-24 — End: 2016-07-02

## 2016-01-24 MED ORDER — ALPRAZOLAM XR 1 MG PO TB24: 1.0000 mg | ORAL_TABLET | Freq: Every day | ORAL | 2 refills | Status: DC

## 2016-01-24 NOTE — Progress Notes (Signed)
Name: Alicia [name]   MRN: [id number]    DOB: [birth year]   Date:01/24/2016       Progress Note  Subjective  Chief Complaint  Chief Complaint  Patient presents with  . Follow-up    3 month F/U, Patient states since stopping the Protonix her chest pain has increasely gotten worst  . Hospitalization Follow-up    Patient went in for Chest pain at [facility] which then she started having seizures. They performed EKG, blood work and imaging which all came back normal. They feel like her seizures were coming from her reflux.    HPI  Hospital follow up: she states that her GERD symptoms was getting worse since he started to wean self off Pantoprazole, but over the past few weeks symptoms got worse because 45 yo joined the [employer] and is struggling with boot camp. She is worried about him. She states that when she is stressed her stomach gets worse. She developed some indigestion Friday after lunch and got worse after a late dinner, but woke up in the middle of the night with burning sensation on epigastric area followed by crushing sensation that caused difficulty breathing. Her boyfriend called 911. She developed seizure like activity, but CT, MRI brain and CT chest , abdomen and pelvis was negative. Neurologist did EEG and negative for seizure activity. She is on a bland diet since discharged from hospital. We will continue depression and anxiety medication and resume Pantoprazole, discussed returning for follow up in 2 weeks but because of cost she would like refills of all her medications  Neck spasms: she takes Flexeril in the afternoon to improve her neck spasms.   Insomnia: still taking Ambien, explained high dose for female and to avoid all sedating medications at the same time.   Patient Active Problem List   Diagnosis Date Noted  . Psychogenic nonepileptic seizure   . Chronic pain syndrome   . Restless leg syndrome   . Altered mental status 01/20/2016  . Chronic prescription  benzodiazepine use 01/20/2016  . Leukocytosis 01/20/2016  . Seizure-like activity ([place])   . GAD (generalized anxiety disorder) 02/14/2015  . Neck pain 02/14/2015  . History of hysterectomy 02/14/2015  . S/P hysterectomy 02/02/2015  . Grieving 12/16/2014  . Iron deficiency anemia due to chronic blood loss 08/29/2014  . Insomnia, persistent 08/14/2014  . Major depression ([place]) 08/14/2014  . Temporomandibular joint sounds on opening and/or closing the jaw 08/14/2014  . Degenerative disc disease, lumbar 08/14/2014  . Bleeding internal hemorrhoids 08/14/2014  . Gastric reflux 08/14/2014  . Blood glucose elevated 08/14/2014  . Irritable bowel syndrome with constipation 08/14/2014  . Hypertriglyceridemia 08/14/2014  . Overweight 08/14/2014  . Restless legs syndrome 08/14/2014  . Tinnitus 08/14/2014  . Vitamin D deficiency 08/14/2014  . Tachycardia 11/25/2012  . DOE (dyspnea on exertion) 11/06/2012  . Previous cesarean delivery, delivered, with or without mention of antepartum condition 01/19/2012    Past Surgical History:  Procedure Laterality Date  . ABDOMINOPLASTY  Feb. 2015  . BREAST SURGERY Bilateral December 2015   Breast Reduction  . CESAREAN SECTION  2011  . CYSTOSCOPY  02/02/2015   Procedure: CYSTOSCOPY;  Surgeon: Gae Dry, MD;  Location: [facility] ORS;  Service: Gynecology;;  . Brigitte Pulse AND CURETTAGE OF UTERUS  2003, 2005, 2008  . HERNIA REPAIR  [id number]   Umbilical  . KNEE SURGERY Right 2006   Knee Arthroscopy  . LAPAROSCOPIC BILATERAL SALPINGECTOMY Bilateral 02/02/2015   Procedure: LAPAROSCOPIC BILATERAL SALPINGECTOMY;  Surgeon: Gae Dry, MD;  Location: [facility] ORS;  Service: Gynecology;  Laterality: Bilateral;  . LAPAROSCOPIC HYSTERECTOMY N/A 02/02/2015   Procedure: HYSTERECTOMY TOTAL LAPAROSCOPIC;  Surgeon: Gae Dry, MD;  Location: [facility] ORS;  Service: Gynecology;  Laterality: N/A;    Family History  Problem Relation Age of Onset  . Hypertension Mother    . Hyperlipidemia Mother   . Heart Problems Father     hole in heart  . Prostate cancer Maternal Grandfather     Social History   Social History  . Marital status: Widowed    Spouse name: N/A  . Number of children: 4  . Years of education: N/A   Occupational History  . accountant [employer]   Social History Main Topics  . Smoking status: Never Smoker  . Smokeless tobacco: Never Used  . Alcohol use No  . Drug use: No  . Sexual activity: Yes    Partners: Male   Other Topics Concern  . Not on file   Social History Narrative  . No narrative on file     Current Outpatient Prescriptions:  .  ALPRAZOLAM XR 1 MG 24 hr tablet, Take 1 tablet (1 mg total) by mouth daily., Disp: 30 tablet, Rfl: 2 .  atenolol (TENORMIN) 25 MG tablet, take 1/2-1 tablet by mouth once daily, Disp: 30 tablet, Rfl: 1 .  cyclobenzaprine (FLEXERIL) 10 MG tablet, Take 1 tablet (10 mg total) by mouth daily., Disp: 30 tablet, Rfl: 5 .  Multiple Vitamins-Minerals (CENTRUM PO), Take 1 tablet by mouth daily. Reported on 02/02/2015, Disp: , Rfl:  .  PARoxetine (PAXIL) 20 MG tablet, Take 20 mg by mouth daily., Disp: , Rfl: 0 .  zolpidem (AMBIEN) 10 MG tablet, Take 1 tablet (10 mg total) by mouth at bedtime as needed. (Patient taking differently: Take 10 mg by mouth at bedtime as needed for sleep. ), Disp: 30 tablet, Rfl: 5 .  pantoprazole (PROTONIX) 40 MG tablet, Take 1 tablet (40 mg total) by mouth daily., Disp: 30 tablet, Rfl: 5  Allergies  Allergen Reactions  . Azithromycin Diarrhea  . Peanuts [Peanut Oil] Swelling    "swelling of lips"   . Penicillins Hives and Swelling    Has patient had a PCN reaction causing immediate rash, facial/tongue/throat swelling, SOB or lightheadedness with hypotension: Yes Has patient had a PCN reaction causing severe rash involving mucus membranes or skin necrosis: No Has patient had a PCN reaction that required hospitalization No Has patient had a PCN reaction occurring  within the last 10 years: No If all of the above answers are "NO", then may proceed with Cephalosporin use.     ROS  Constitutional: Negative for fever or weight change [facility] weight likely not accurate ).  Respiratory: Negative for cough and shortness of breath.   Cardiovascular: Negative for chest pain ( resolved ) or palpitations.  Gastrointestinal: Negative for abdominal pain, no bowel changes.  Musculoskeletal: Negative for gait problem or joint swelling.  Skin: Negative for rash.  Neurological: Negative for dizziness or headache.  No other specific complaints in a complete review of systems (except as listed in HPI above).  Objective  Vitals:   01/24/16 1411  BP: 106/62  Pulse: (!) 103  Resp: 18  Temp: 98.3 F (36.8 C)  TempSrc: Oral  SpO2: 97%  Weight: 171 lb 3.2 oz (77.7 kg)  Height: '5\' 8"'  (1.727 m)    Body mass index is 26.03 kg/m.  Physical Exam  Constitutional:  Patient appears well-developed and well-nourished. No distress.  HEENT: head atraumatic, normocephalic, pupils equal and reactive to light, neck supple, throat within normal limits Cardiovascular: Normal rate, regular rhythm and normal heart sounds.  No murmur heard. No BLE edema. Pulmonary/Chest: Effort normal and breath sounds normal. No respiratory distress. Abdominal: Soft.  There is tenderness on epigastric area. Psychiatric: Patient has a normal mood and affect. behavior is normal. Judgment and thought content normal.  Recent Results (from the past 2160 hour(s))  Basic metabolic panel - if new onset seizures     Status: Abnormal   Collection Time: 01/20/16 12:26 AM  Result Value Ref Range   Sodium 136 135 - 145 mmol/L   Potassium 4.3 3.5 - 5.1 mmol/L   Chloride 102 101 - 111 mmol/L   CO2 24 22 - 32 mmol/L   Glucose, Bld 117 (H) 65 - 99 mg/dL   BUN 19 6 - 20 mg/dL   Creatinine, Ser 0.86 0.44 - 1.00 mg/dL   Calcium 8.9 8.9 - 10.3 mg/dL   GFR calc non Af Amer >60 >60 mL/min   GFR calc  Af Amer >60 >60 mL/min    Comment: (NOTE) The eGFR has been calculated using the CKD EPI equation. This calculation has not been validated in all clinical situations. eGFR's persistently <60 mL/min signify possible Chronic Kidney Disease.    Anion gap 10 5 - 15  CBC - if new onset seizures     Status: Abnormal   Collection Time: 01/20/16 12:26 AM  Result Value Ref Range   WBC 12.3 (H) 4.0 - 10.5 K/uL   RBC 4.70 3.87 - 5.11 MIL/uL   Hemoglobin 14.6 12.0 - 15.0 g/dL   HCT 41.5 36.0 - 46.0 %   MCV 88.3 78.0 - 100.0 fL   MCH 31.1 26.0 - 34.0 pg   MCHC 35.2 30.0 - 36.0 g/dL   RDW 13.5 11.5 - 15.5 %   Platelets 263 150 - 400 K/uL  CBG monitoring, ED     Status: Abnormal   Collection Time: 01/20/16 12:36 AM  Result Value Ref Range   Glucose-Capillary 115 (H) 65 - 99 mg/dL  Protime-INR     Status: None   Collection Time: 01/20/16 12:43 AM  Result Value Ref Range   Prothrombin Time 12.4 11.4 - 15.2 seconds   INR 0.92   APTT     Status: None   Collection Time: 01/20/16 12:43 AM  Result Value Ref Range   aPTT 28 24 - 36 seconds  I-stat troponin, ED     Status: None   Collection Time: 01/20/16 12:47 AM  Result Value Ref Range   Troponin i, poc 0.00 0.00 - 0.08 ng/mL   Comment 3            Comment: Due to the release kinetics of cTnI, a negative result within the first hours of the onset of symptoms does not rule out myocardial infarction with certainty. If myocardial infarction is still suspected, repeat the test at appropriate intervals.   Ethanol     Status: None   Collection Time: 01/20/16 12:47 AM  Result Value Ref Range   Alcohol, Ethyl (B) <5 <5 mg/dL    Comment:        LOWEST DETECTABLE LIMIT FOR SERUM ALCOHOL IS 5 mg/dL FOR MEDICAL PURPOSES ONLY   Urine rapid drug screen (hosp performed)not at [facility]     Status: Abnormal   Collection Time: 01/20/16 12:48 AM  Result Value Ref Range  Opiates NONE DETECTED NONE DETECTED   Cocaine NONE DETECTED NONE DETECTED    Benzodiazepines POSITIVE (A) NONE DETECTED   Amphetamines NONE DETECTED NONE DETECTED   Tetrahydrocannabinol NONE DETECTED NONE DETECTED   Barbiturates NONE DETECTED NONE DETECTED    Comment:        DRUG SCREEN FOR MEDICAL PURPOSES ONLY.  IF CONFIRMATION IS NEEDED FOR ANY PURPOSE, NOTIFY LAB WITHIN 5 DAYS.        LOWEST DETECTABLE LIMITS FOR URINE DRUG SCREEN Drug Class       Cutoff (ng/mL) Amphetamine      1000 Barbiturate      200 Benzodiazepine   409 Tricyclics       811 Opiates          300 Cocaine          300 THC              50   Urinalysis, Routine w reflex microscopic (not at [facility])     Status: None   Collection Time: 01/20/16 12:48 AM  Result Value Ref Range   Color, Urine YELLOW YELLOW   APPearance CLEAR CLEAR   Specific Gravity, Urine 1.010 1.005 - 1.030   pH 7.0 5.0 - 8.0   Glucose, UA NEGATIVE NEGATIVE mg/dL   Hgb urine dipstick NEGATIVE NEGATIVE   Bilirubin Urine NEGATIVE NEGATIVE   Ketones, ur NEGATIVE NEGATIVE mg/dL   Protein, ur NEGATIVE NEGATIVE mg/dL   Nitrite NEGATIVE NEGATIVE   Leukocytes, UA NEGATIVE NEGATIVE    Comment: MICROSCOPIC NOT DONE ON URINES WITH NEGATIVE PROTEIN, BLOOD, LEUKOCYTES, NITRITE, OR GLUCOSE <1000 mg/dL.  I-Stat Chem 8, ED     Status: Abnormal   Collection Time: 01/20/16 12:49 AM  Result Value Ref Range   Sodium 139 135 - 145 mmol/L   Potassium 4.3 3.5 - 5.1 mmol/L   Chloride 102 101 - 111 mmol/L   BUN 26 (H) 6 - 20 mg/dL   Creatinine, Ser 0.90 0.44 - 1.00 mg/dL   Glucose, Bld 119 (H) 65 - 99 mg/dL   Calcium, Ion 1.10 (L) 1.15 - 1.40 mmol/L   TCO2 28 0 - 100 mmol/L   Hemoglobin 13.9 12.0 - 15.0 g/dL   HCT 41.0 36.0 - 46.0 %  Hepatic function panel     Status: Abnormal   Collection Time: 01/20/16 12:52 AM  Result Value Ref Range   Total Protein 6.5 6.5 - 8.1 g/dL   Albumin 3.9 3.5 - 5.0 g/dL   AST 28 15 - 41 U/L   ALT 24 14 - 54 U/L   Alkaline Phosphatase 73 38 - 126 U/L   Total Bilirubin 0.5 0.3 - 1.2 mg/dL    Bilirubin, Direct 0.3 0.1 - 0.5 mg/dL   Indirect Bilirubin 0.2 (L) 0.3 - 0.9 mg/dL  I-Stat Beta hCG blood, ED ([facility], [facility], [facility] only)     Status: None   Collection Time: 01/20/16  2:38 AM  Result Value Ref Range   I-stat hCG, quantitative <5.0 <5 mIU/mL   Comment 3            Comment:   GEST. AGE      CONC.  (mIU/mL)   <=1 WEEK        5 - 50     2 WEEKS       50 - 500     3 WEEKS       100 - 10,000     4 WEEKS     1,000 -  30,000        FEMALE AND NON-PREGNANT FEMALE:     LESS THAN 5 mIU/mL   Troponin I     Status: None   Collection Time: 01/20/16  3:41 AM  Result Value Ref Range   Troponin I <0.03 <0.03 ng/mL  CBG monitoring, ED     Status: Abnormal   Collection Time: 01/20/16  6:23 AM  Result Value Ref Range   Glucose-Capillary 119 (H) 65 - 99 mg/dL  CBC     Status: Abnormal   Collection Time: 01/20/16  7:49 AM  Result Value Ref Range   WBC 10.9 (H) 4.0 - 10.5 K/uL   RBC 4.34 3.87 - 5.11 MIL/uL   Hemoglobin 13.0 12.0 - 15.0 g/dL   HCT 38.4 36.0 - 46.0 %   MCV 88.5 78.0 - 100.0 fL   MCH 30.0 26.0 - 34.0 pg   MCHC 33.9 30.0 - 36.0 g/dL   RDW 13.8 11.5 - 15.5 %   Platelets 222 150 - 400 K/uL  Glucose, capillary     Status: None   Collection Time: 01/20/16  7:59 PM  Result Value Ref Range   Glucose-Capillary 98 65 - 99 mg/dL  MRSA PCR Screening     Status: None   Collection Time: 01/21/16  2:19 AM  Result Value Ref Range   MRSA by PCR NEGATIVE NEGATIVE    Comment:        The GeneXpert MRSA Assay (FDA approved for NASAL specimens only), is one component of a comprehensive MRSA colonization surveillance program. It is not intended to diagnose MRSA infection nor to guide or monitor treatment for MRSA infections.       PHQ2/9: Depression screen [facility] 2/9 01/24/2016 11/01/2015 07/31/2015 05/05/2015 02/14/2015  Decreased Interest 0 0 2 0 3  Down, Depressed, Hopeless 0 0 1 0 1  PHQ - 2 Score 0 0 3 0 4  Altered sleeping - - 2 - 1  Tired, decreased energy - - 0 - 0  Change in  appetite - - 0 - 1  Feeling bad or failure about yourself  - - 0 - 0  Trouble concentrating - - 0 - 0  Moving slowly or fidgety/restless - - 0 - 2  Suicidal thoughts - - 0 - 0  PHQ-9 Score - - 5 - 8  Difficult doing work/chores - - Somewhat difficult - Somewhat difficult     Fall Risk: Fall Risk  01/24/2016 11/01/2015 07/31/2015 05/05/2015 02/14/2015  Falls in the past year? No Yes No No No  Number falls in past yr: - 2 or more - - -  Injury with Fall? - No - - -    Assessment & Plan  1. Gastric reflux  She states chest pain was likely from her GERD flare because we were weaning her off Pantoprazole, she is also under more stressed because her 45 yo just went to boot camp 3 weeks ago and he is struggling during boot camp. He wants to quit. It is causing a lot of stress.   We will resume Pantoprazole - pantoprazole (PROTONIX) 40 MG tablet; Take 1 tablet (40 mg total) by mouth daily.  Dispense: 30 tablet; Refill: 5  2. Seizure-like activity ([place])  Had a normal EEG, seen by neurologist during hospital stay, sent home without medication for seizures and is doing well since.   3. GAD (generalized anxiety disorder)  - ALPRAZOLAM XR 1 MG 24 hr tablet; Take 1 tablet (1 mg total) by  mouth daily.  Dispense: 30 tablet; Refill: 2 - PARoxetine (PAXIL) 40 MG tablet; Take 1 tablet (40 mg total) by mouth daily.  Dispense: 30 tablet; Refill: 5  4. Neck pain  - cyclobenzaprine (FLEXERIL) 10 MG tablet; Take 1 tablet (10 mg total) by mouth daily.  Dispense: 30 tablet; Refill: 5  5. Tachycardia  - atenolol (TENORMIN) 25 MG tablet; take 1/2-1 tablet by mouth once daily  Dispense: 30 tablet; Refill: 5  6. Insomnia, persistent  - zolpidem (AMBIEN) 10 MG tablet; Take 1 tablet (10 mg total) by mouth at bedtime as needed.  Dispense: 30 tablet; Refill: 5

## 2016-01-25 ENCOUNTER — Ambulatory Visit: Payer: BLUE CROSS/BLUE SHIELD | Admitting: Family Medicine

## 2016-02-06 ENCOUNTER — Ambulatory Visit: Payer: BLUE CROSS/BLUE SHIELD | Admitting: Family Medicine

## 2016-02-07 NOTE — Telephone Encounter (Signed)
Erroneous Entry  

## 2016-02-17 ENCOUNTER — Other Ambulatory Visit: Payer: Self-pay | Admitting: Family Medicine

## 2016-02-17 DIAGNOSIS — F411 Generalized anxiety disorder: Secondary | ICD-10-CM

## 2016-03-16 ENCOUNTER — Other Ambulatory Visit: Payer: Self-pay | Admitting: Family Medicine

## 2016-03-16 DIAGNOSIS — M542 Cervicalgia: Secondary | ICD-10-CM

## 2016-03-16 DIAGNOSIS — R000 Tachycardia, unspecified: Secondary | ICD-10-CM

## 2016-04-25 ENCOUNTER — Encounter: Payer: Self-pay | Admitting: Family Medicine

## 2016-04-25 ENCOUNTER — Ambulatory Visit (INDEPENDENT_AMBULATORY_CARE_PROVIDER_SITE_OTHER): Payer: BLUE CROSS/BLUE SHIELD | Admitting: Family Medicine

## 2016-04-25 VITALS — BP 106/64 | HR 138 | Temp 98.3°F | Resp 16 | Ht 68.0 in | Wt 179.4 lb

## 2016-04-25 DIAGNOSIS — R000 Tachycardia, unspecified: Secondary | ICD-10-CM

## 2016-04-25 DIAGNOSIS — R7303 Prediabetes: Secondary | ICD-10-CM

## 2016-04-25 DIAGNOSIS — Z79899 Other long term (current) drug therapy: Secondary | ICD-10-CM | POA: Diagnosis not present

## 2016-04-25 DIAGNOSIS — F411 Generalized anxiety disorder: Secondary | ICD-10-CM | POA: Diagnosis not present

## 2016-04-25 DIAGNOSIS — E781 Pure hyperglyceridemia: Secondary | ICD-10-CM | POA: Diagnosis not present

## 2016-04-25 DIAGNOSIS — E559 Vitamin D deficiency, unspecified: Secondary | ICD-10-CM | POA: Diagnosis not present

## 2016-04-25 DIAGNOSIS — Z113 Encounter for screening for infections with a predominantly sexual mode of transmission: Secondary | ICD-10-CM

## 2016-04-25 DIAGNOSIS — G4700 Insomnia, unspecified: Secondary | ICD-10-CM

## 2016-04-25 DIAGNOSIS — K581 Irritable bowel syndrome with constipation: Secondary | ICD-10-CM | POA: Diagnosis not present

## 2016-04-25 MED ORDER — CARVEDILOL 3.125 MG PO TABS: 3.1250 mg | ORAL_TABLET | Freq: Two times a day (BID) | ORAL | 2 refills | Status: DC

## 2016-04-25 MED ORDER — ALPRAZOLAM XR 1 MG PO TB24: 1.0000 mg | ORAL_TABLET | Freq: Every day | ORAL | 2 refills | Status: DC

## 2016-04-25 NOTE — Progress Notes (Signed)
Name: Alicia HornsDorothy V [name]   MRN: [id number]    DOB: November 10, 1970   Date:04/25/2016       Progress Note  Subjective  Chief Complaint  Chief Complaint  Patient presents with  . Medication Refill    3 month F/U  . Hypertension    Denies any symptoms  . Anxiety    Stable  . Insomnia    Doing well, sleeping on average-6 hours nightly  . Gastroesophageal Reflux    HPI  Grieving /GAD : Husband died suddenly of a brain aneurysm on October 23 rd, 2016. She has been taking her antidepressant medication and Alprazolam as prescribed. Coping better with life. Third son Reuel Boom( Daniel ) just finished boot camp, youngest Molli Hazard( Matthew ) is now smaller private school - got aggressive at school and was kicked out January 2018.  She is taking Alprazolam XR and has noticed decrease in panic attack. She is still dating, still grieving, but coping better  Hypertriglyceridemia: on diet only, avoiding fried food, eating a lot of peanut butter recently and has gained weight. LDL slightly up now, but HDL at goal.   Hyperglycemia: she has gained weight since last visit, she is having more of an appetite now, still not eating healthy all the time, but is snacking when hungry, she denies polyphagia, polydipsia or polyuria. Not eating regular meals, husband was the cook, kids eat at her parents house. She states she has been stress eating at work - sweets.     Insomnia: taking Ambien and is taking her less than 30 minutes to fall asleep . Able to stay asleep for at least 6 hours, wakes up because of her dogs and has difficulty falling back asleep.   Neck spasms: she takes Flexeril in the afternoon to improve her neck spasms.   IBS: doing better, since she has hysterectomy, she still has narrow stools, no blood or mucus in stools. Seen by Dr. Shelle Ironein previously. Normal thyroid panel in 07/2014  GERD: taking Pantoprazole, she has occasional symptoms now, back to baseline, [facility] but symptoms are not that  severe anymore.   Tachycardia: seen by Cardiologist in the past, they discussed We will try Coreg prn  Patient Active Problem List   Diagnosis Date Noted  . Psychogenic nonepileptic seizure   . Chronic pain syndrome   . Restless leg syndrome   . Altered mental status 01/20/2016  . Chronic prescription benzodiazepine use 01/20/2016  . Leukocytosis 01/20/2016  . Seizure-like activity (HCC)   . GAD (generalized anxiety disorder) 02/14/2015  . Neck pain 02/14/2015  . History of hysterectomy 02/14/2015  . S/P hysterectomy 02/02/2015  . Grieving 12/16/2014  . Iron deficiency anemia due to chronic blood loss 08/29/2014  . Insomnia, persistent 08/14/2014  . Major depression (HCC) 08/14/2014  . Temporomandibular joint sounds on opening and/or closing the jaw 08/14/2014  . Degenerative disc disease, lumbar 08/14/2014  . Bleeding internal hemorrhoids 08/14/2014  . Gastric reflux 08/14/2014  . Blood glucose elevated 08/14/2014  . Irritable bowel syndrome with constipation 08/14/2014  . Hypertriglyceridemia 08/14/2014  . Overweight 08/14/2014  . Restless legs syndrome 08/14/2014  . Tinnitus 08/14/2014  . Vitamin D deficiency 08/14/2014  . Tachycardia 11/25/2012  . DOE (dyspnea on exertion) 11/06/2012  . Previous cesarean delivery, delivered, with or without mention of antepartum condition 01/19/2012    Past Surgical History:  Procedure Laterality Date  . ABDOMINOPLASTY  Feb. 2015  . BREAST SURGERY Bilateral December 2015   Breast Reduction  .  CESAREAN SECTION  2011  . CYSTOSCOPY  02/02/2015   Procedure: CYSTOSCOPY;  Surgeon: Nadara Mustard, MD;  Location: [facility] ORS;  Service: Gynecology;;  . Joya Gaskins AND CURETTAGE OF UTERUS  2003, 2005, 2008  . HERNIA REPAIR  1999   Umbilical  . KNEE SURGERY Right 2006   Knee Arthroscopy  . LAPAROSCOPIC BILATERAL SALPINGECTOMY Bilateral 02/02/2015   Procedure: LAPAROSCOPIC BILATERAL SALPINGECTOMY;  Surgeon: Nadara Mustard, MD;  Location: [facility]  ORS;  Service: Gynecology;  Laterality: Bilateral;  . LAPAROSCOPIC HYSTERECTOMY N/A 02/02/2015   Procedure: HYSTERECTOMY TOTAL LAPAROSCOPIC;  Surgeon: Nadara Mustard, MD;  Location: [facility] ORS;  Service: Gynecology;  Laterality: N/A;    Family History  Problem Relation Age of Onset  . Hypertension Mother   . Hyperlipidemia Mother   . Heart Problems Father     hole in heart  . Prostate cancer Maternal Grandfather     Social History   Social History  . Marital status: Widowed    Spouse name: N/A  . Number of children: 4  . 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 Smoking status: Never Smoker  . Smokeless tobacco: Never Used  . Alcohol use No  . Drug use: No  . Sexual activity: Yes    Partners: Male   Other Topics Concern  . Not on file   Social History Narrative  . No narrative on file     Current Outpatient Prescriptions:  .  ALPRAZOLAM XR 1 MG 24 hr tablet, Take 1 tablet (1 mg total) by mouth daily., Disp: 30 tablet, Rfl: 2 .  cyclobenzaprine (FLEXERIL) 10 MG tablet, Take 1 tablet (10 mg total) by mouth daily., Disp: 30 tablet, Rfl: 5 .  Multiple Vitamins-Minerals (CENTRUM PO), Take 1 tablet by mouth daily. Reported on 02/02/2015, Disp: , Rfl:  .  pantoprazole (PROTONIX) 40 MG tablet, Take 1 tablet (40 mg total) by mouth daily., Disp: 30 tablet, Rfl: 5 .  PARoxetine (PAXIL) 40 MG tablet, Take 1 tablet (40 mg total) by mouth daily., Disp: 30 tablet, Rfl: 5 .  zolpidem (AMBIEN) 10 MG tablet, Take 1 tablet (10 mg total) by mouth at bedtime as needed., Disp: 30 tablet, Rfl: 5 .  carvedilol (COREG) 3.125 MG tablet, Take 1 tablet (3.125 mg total) by mouth 2 (two) times daily with a meal., Disp: 60 tablet, Rfl: 2  Allergies  Allergen Reactions  . Azithromycin Diarrhea  . Peanuts [Peanut Oil] Swelling    "swelling of lips"   . Penicillins Hives and Swelling    Has patient had a PCN reaction causing  immediate rash, facial/tongue/throat swelling, SOB or lightheadedness with hypotension: Yes Has patient had a PCN reaction causing severe rash involving mucus membranes or skin necrosis: No Has patient had a PCN reaction that required hospitalization No Has patient had a PCN reaction occurring within the last 10 years: No If all of the above answers are "NO", then may proceed with Cephalosporin use.     ROS  Constitutional: Negative for fever or weight change.  Respiratory: Negative for cough and shortness of breath.   Cardiovascular: Negative for chest pain or palpitations.  Gastrointestinal: Negative for abdominal pain, no bowel changes.  Musculoskeletal: Negative for gait problem or joint swelling.  Skin: Negative for rash.  Neurological: Negative for dizziness or headache.  No other specific complaints in a complete review of systems (except as listed in HPI above).  Objective  Vitals:   04/25/16 0809  BP: 106/64  Pulse: (!) 138  Resp: 16  Temp: 98.3 F (36.8 C)  TempSrc: Oral  SpO2: 95%  Weight: 179 lb 6.4 oz (81.4 kg)  Height: 5\' 8"  (1.727 m)    Body mass index is 27.28 kg/m.  Physical Exam  Constitutional: Patient appears well-developed and well-nourished. Obese No distress.  HEENT: head atraumatic, normocephalic, pupils equal and reactive to light,  neck supple, throat within normal limits Cardiovascular: Normal rate, tachycardia but normal heart sounds.  No murmur heard. No BLE edema. Pulmonary/Chest: Effort normal and breath sounds normal. No respiratory distress. Abdominal: Soft.  There is no tenderness. Psychiatric: Patient has a normal mood and affect. behavior is normal. Judgment and thought content normal.   PHQ2/9: Depression screen [facility] 2/9 04/25/2016 01/24/2016 11/01/2015 07/31/2015 05/05/2015  Decreased Interest 0 0 0 2 0  Down, Depressed, Hopeless 0 0 0 1 0  PHQ - 2 Score 0 0 0 3 0  Altered sleeping - - - 2 -  Tired, decreased energy - - - 0 -   Change in appetite - - - 0 -  Feeling bad or failure about yourself  - - - 0 -  Trouble concentrating - - - 0 -  Moving slowly or fidgety/restless - - - 0 -  Suicidal thoughts - - - 0 -  PHQ-9 Score - - - 5 -  Difficult doing work/chores - - - Somewhat difficult -    Fall Risk: Fall Risk  04/25/2016 01/24/2016 11/01/2015 07/31/2015 05/05/2015  Falls in the past year? No No Yes No No  Number falls in past yr: - - 2 or more - -  Injury with Fall? - - No - -     Functional Status Survey: Is the patient deaf or have difficulty hearing?: No Does the patient have difficulty seeing, even when wearing glasses/contacts?: No Does the patient have difficulty concentrating, remembering, or making decisions?: No Does the patient have difficulty walking or climbing stairs?: No Does the patient have difficulty dressing or bathing?: No Does the patient have difficulty doing errands alone such as visiting a doctor's office or shopping?: No    Assessment & Plan  1. GAD (generalized anxiety disorder)  Stable - ALPRAZOLAM XR 1 MG 24 hr tablet; Take 1 tablet (1 mg total) by mouth daily.  Dispense: 30 tablet; Refill: 2  2. Tachycardia  We will stop Atenolol since she has only been taking half dose and HR is very high, but bp is low - carvedilol (COREG) 3.125 MG tablet; Take 1 tablet (3.125 mg total) by mouth 2 (two) times daily with a meal.  Dispense: 60 tablet; Refill: 2  3. Insomnia, persistent  Continue medication   4. Irritable bowel syndrome with constipation  Stable  5. Vitamin D deficiency  Discussed otc supplementation  5. Vitamin D deficiency  - VITAMIN D 25 Hydroxy (Vit-D Deficiency, Fractures)  6. Hypertriglyceridemia  - Lipid panel Continue life style modification   7. Pre-diabetes  - Insulin, fasting - Hemoglobin A1c  8. Encounter for long-term (current) use of high-risk medication  - COMPLETE METABOLIC PANEL WITH GFR - CBC with Differential/Platelet  9.  Routine screening for STI (sexually transmitted infection)  - HIV antibody - RPR

## 2016-04-26 ENCOUNTER — Other Ambulatory Visit: Payer: Self-pay

## 2016-04-26 DIAGNOSIS — Z113 Encounter for screening for infections with a predominantly sexual mode of transmission: Secondary | ICD-10-CM

## 2016-04-26 DIAGNOSIS — R000 Tachycardia, unspecified: Secondary | ICD-10-CM

## 2016-04-26 DIAGNOSIS — E559 Vitamin D deficiency, unspecified: Secondary | ICD-10-CM

## 2016-04-26 DIAGNOSIS — R7303 Prediabetes: Secondary | ICD-10-CM

## 2016-04-26 DIAGNOSIS — Z79899 Other long term (current) drug therapy: Secondary | ICD-10-CM

## 2016-04-27 LAB — CMP12+LP+TP+TSH+6AC+CBC/D/PLT
ALK PHOS: 79 IU/L (ref 39–117)
ALT: 13 IU/L (ref 0–32)
AST: 13 IU/L (ref 0–40)
Albumin/Globulin Ratio: 1.6 (ref 1.2–2.2)
Albumin: 4.1 g/dL (ref 3.5–5.5)
BILIRUBIN TOTAL: 0.6 mg/dL (ref 0.0–1.2)
BUN / CREAT RATIO: 17 (ref 9–23)
BUN: 17 mg/dL (ref 6–24)
Basophils Absolute: 0 10*3/uL (ref 0.0–0.2)
Basos: 0 %
CHOL/HDL RATIO: 5.3 ratio — AB (ref 0.0–4.4)
Calcium: 9.1 mg/dL (ref 8.7–10.2)
Chloride: 100 mmol/L (ref 96–106)
Cholesterol, Total: 242 mg/dL — ABNORMAL HIGH (ref 100–199)
Creatinine, Ser: 1.01 mg/dL — ABNORMAL HIGH (ref 0.57–1.00)
EOS (ABSOLUTE): 0.1 10*3/uL (ref 0.0–0.4)
EOS: 1 %
Estimated CHD Risk: 1.4 times avg. — ABNORMAL HIGH (ref 0.0–1.0)
Free Thyroxine Index: 1.2 (ref 1.2–4.9)
GFR, EST AFRICAN AMERICAN: 78 mL/min/{1.73_m2} (ref >59)
GFR, EST NON AFRICAN AMERICAN: 67 mL/min/{1.73_m2} (ref >59)
GGT: 15 IU/L (ref 0–60)
GLOBULIN, TOTAL: 2.6 g/dL (ref 1.5–4.5)
GLUCOSE: 103 mg/dL — AB (ref 65–99)
HDL: 46 mg/dL (ref >39)
HEMATOCRIT: 41.4 % (ref 34.0–46.6)
HEMOGLOBIN: 13.8 g/dL (ref 11.1–15.9)
IMMATURE GRANS (ABS): 0 10*3/uL (ref 0.0–0.1)
Immature Granulocytes: 0 %
Iron: 127 ug/dL (ref 27–159)
LDH: 159 IU/L (ref 119–226)
LDL CALC: 132 mg/dL — AB (ref 0–99)
LYMPHS: 21 %
Lymphocytes Absolute: 1.5 10*3/uL (ref 0.7–3.1)
MCH: 29.7 pg (ref 26.6–33.0)
MCHC: 33.3 g/dL (ref 31.5–35.7)
MCV: 89 fL (ref 79–97)
MONOCYTES: 3 %
Monocytes Absolute: 0.3 10*3/uL (ref 0.1–0.9)
Neutrophils Absolute: 5.5 10*3/uL (ref 1.4–7.0)
Neutrophils: 75 %
PHOSPHORUS: 3.4 mg/dL (ref 2.5–4.5)
POTASSIUM: 4.2 mmol/L (ref 3.5–5.2)
Platelets: 237 10*3/uL (ref 150–379)
RBC: 4.64 x10E6/uL (ref 3.77–5.28)
RDW: 13.9 % (ref 12.3–15.4)
Sodium: 142 mmol/L (ref 134–144)
T3 Uptake Ratio: 23 % — ABNORMAL LOW (ref 24–39)
T4 TOTAL: 5.4 ug/dL (ref 4.5–12.0)
TRIGLYCERIDES: 321 mg/dL — AB (ref 0–149)
TSH: 1.86 u[IU]/mL (ref 0.450–4.500)
Total Protein: 6.7 g/dL (ref 6.0–8.5)
URIC ACID: 4.7 mg/dL (ref 2.5–7.1)
VLDL CHOLESTEROL CAL: 64 mg/dL — AB (ref 5–40)
WBC: 7.3 10*3/uL (ref 3.4–10.8)

## 2016-04-27 LAB — HIV ANTIBODY (ROUTINE TESTING W REFLEX): HIV SCREEN 4TH GENERATION: NONREACTIVE

## 2016-04-27 LAB — VITAMIN D 25 HYDROXY (VIT D DEFICIENCY, FRACTURES): Vit D, 25-Hydroxy: 17.7 ng/mL — ABNORMAL LOW (ref 30.0–100.0)

## 2016-04-27 LAB — RPR QUALITATIVE: RPR Ser Ql: NONREACTIVE

## 2016-04-27 LAB — HGB A1C W/O EAG: Hgb A1c MFr Bld: 5.5 % (ref 4.8–5.6)

## 2016-04-29 ENCOUNTER — Other Ambulatory Visit: Payer: Self-pay | Admitting: Family Medicine

## 2016-04-29 MED ORDER — VITAMIN D (ERGOCALCIFEROL) 1.25 MG (50000 UNIT) PO CAPS: 50000.0000 [IU] | ORAL_CAPSULE | ORAL | 0 refills | Status: DC

## 2016-06-02 ENCOUNTER — Other Ambulatory Visit: Payer: Self-pay | Admitting: Family Medicine

## 2016-06-02 DIAGNOSIS — F411 Generalized anxiety disorder: Secondary | ICD-10-CM

## 2016-07-02 ENCOUNTER — Ambulatory Visit (INDEPENDENT_AMBULATORY_CARE_PROVIDER_SITE_OTHER): Payer: BLUE CROSS/BLUE SHIELD | Admitting: Family Medicine

## 2016-07-02 ENCOUNTER — Encounter: Payer: Self-pay | Admitting: Family Medicine

## 2016-07-02 VITALS — BP 102/60 | HR 117 | Temp 98.2°F | Resp 16 | Wt 185.8 lb

## 2016-07-02 DIAGNOSIS — M542 Cervicalgia: Secondary | ICD-10-CM | POA: Diagnosis not present

## 2016-07-02 DIAGNOSIS — F411 Generalized anxiety disorder: Secondary | ICD-10-CM | POA: Diagnosis not present

## 2016-07-02 DIAGNOSIS — W57XXXA Bitten or stung by nonvenomous insect and other nonvenomous arthropods, initial encounter: Secondary | ICD-10-CM | POA: Diagnosis not present

## 2016-07-02 DIAGNOSIS — G4700 Insomnia, unspecified: Secondary | ICD-10-CM

## 2016-07-02 DIAGNOSIS — E781 Pure hyperglyceridemia: Secondary | ICD-10-CM

## 2016-07-02 DIAGNOSIS — R7303 Prediabetes: Secondary | ICD-10-CM

## 2016-07-02 DIAGNOSIS — K219 Gastro-esophageal reflux disease without esophagitis: Secondary | ICD-10-CM

## 2016-07-02 DIAGNOSIS — R000 Tachycardia, unspecified: Secondary | ICD-10-CM

## 2016-07-02 MED ORDER — ICOSAPENT ETHYL 1 G PO CAPS: 2.0000 g | ORAL_CAPSULE | Freq: Two times a day (BID) | ORAL | 5 refills | Status: DC

## 2016-07-02 MED ORDER — PANTOPRAZOLE SODIUM 40 MG PO TBEC: 40.0000 mg | DELAYED_RELEASE_TABLET | Freq: Every day | ORAL | 5 refills | Status: DC

## 2016-07-02 MED ORDER — PAROXETINE HCL 40 MG PO TABS: 40.0000 mg | ORAL_TABLET | Freq: Every day | ORAL | 5 refills | Status: DC

## 2016-07-02 MED ORDER — CYCLOBENZAPRINE HCL 10 MG PO TABS: 10.0000 mg | ORAL_TABLET | Freq: Every day | ORAL | 5 refills | Status: DC

## 2016-07-02 MED ORDER — DOXYCYCLINE HYCLATE 100 MG PO TABS: 100.0000 mg | ORAL_TABLET | Freq: Two times a day (BID) | ORAL | 0 refills | Status: DC

## 2016-07-02 MED ORDER — ZOLPIDEM TARTRATE 10 MG PO TABS: 10.0000 mg | ORAL_TABLET | Freq: Every evening | ORAL | 5 refills | Status: DC | PRN

## 2016-07-02 MED ORDER — ALPRAZOLAM XR 1 MG PO TB24: 1.0000 mg | ORAL_TABLET | Freq: Every day | ORAL | 2 refills | Status: DC

## 2016-07-02 NOTE — Progress Notes (Signed)
Name: Alicia [name]   MRN: [id number]    DOB: [birth year]   Date:07/02/2016       Progress Note  Subjective  Chief Complaint  Chief Complaint  Patient presents with  . Medication Refill    3 month F/U  . Insect Bite    Patient states she found 2 ticks on her last week they were embeded. Removed them now she has a red rash that is widening. Would like checked out, on left axillary part and left shoulder.   Marland Kitchen Anxiety  . Tachycardia    Has been dizzy  . Insomnia    Has been normal sleeping 6-7 hours with medication  . Gastroesophageal Reflux    Unchanged.     HPI  GAD: Husband died suddenly of a brain aneurysm on October 23 rd, 2016. She has been taking her antidepressant medication and Alprazolam as prescribed. Coping better with life. Third son Reuel Boom ) just finished boot camp, youngest Molli Hazard ) is now smaller private school - got aggressive at school and was kicked out January 2018, she remarried her high school sweet heart 06/07/2016 ( he is still in [place], but will move him) .  She is taking Alprazolam XR and has noticed decrease in panic attack.  Hypertriglyceridemia: she has not been compliant with her diet, she states going back and forth to [place] and eating out more often   Hyperglycemia: she has gained weight since last visit, she is having more of an appetite now, still not eating healthy all the time, but is snacking when hungry, she denies polyphagia, polydipsia or polyuria.     Insomnia: taking Ambien and is taking her less than 30 minutes to fall asleep . Able to stay asleep for at least 6 hours, wakes up because of her dogs and has difficulty falling back asleep.   Neck spasms: she takes Flexeril in the afternoon to improve her neck spasms.   IBS: doing better, since she has hysterectomy, she still has narrow stools, no blood or mucus in stools. Seen by Dr. Shelle Iron previously.  GERD: taking Pantoprazole, she has occasional symptoms now, back to baseline,  went to [facility] December 2017 but symptoms are not that severe anymore.   Tachycardia: seen by Cardiologist in the past, they discussed medication, she is now on Coreg usually qhs and feeling better  Tick bite: multiple bites one week ago, but the spot on her back is larger than a quarter, may have been attached for over 2 days, having headaches and some nausea. She denies joint aches  Patient Active Problem List   Diagnosis Date Noted  . Psychogenic nonepileptic seizure   . Chronic pain syndrome   . Restless leg syndrome   . Altered mental status 01/20/2016  . Chronic prescription benzodiazepine use 01/20/2016  . Leukocytosis 01/20/2016  . Seizure-like activity (HCC)   . GAD (generalized anxiety disorder) 02/14/2015  . Neck pain 02/14/2015  . History of hysterectomy 02/14/2015  . S/P hysterectomy 02/02/2015  . Grieving 12/16/2014  . Iron deficiency anemia due to chronic blood loss 08/29/2014  . Insomnia, persistent 08/14/2014  . Major depression (HCC) 08/14/2014  . Temporomandibular joint sounds on opening and/or closing the jaw 08/14/2014  . Degenerative disc disease, lumbar 08/14/2014  . Bleeding internal hemorrhoids 08/14/2014  . Gastric reflux 08/14/2014  . Blood glucose elevated 08/14/2014  . Irritable bowel syndrome with constipation 08/14/2014  . Hypertriglyceridemia 08/14/2014  . Overweight 08/14/2014  . Restless legs syndrome 08/14/2014  .  Tinnitus 08/14/2014  . Vitamin D deficiency 08/14/2014  . Tachycardia 11/25/2012  . DOE (dyspnea on exertion) 11/06/2012  . Previous cesarean delivery, delivered, with or without mention of antepartum condition 01/19/2012    Past Surgical History:  Procedure Laterality Date  . ABDOMINOPLASTY  Feb. 2015  . BREAST SURGERY Bilateral December 2015   Breast Reduction  . CESAREAN SECTION  07-04-2009  . CYSTOSCOPY  02/02/2015   Procedure: CYSTOSCOPY;  Surgeon: Nadara Mustard, MD;  Location: [facility] ORS;  Service: Gynecology;;  . Joya Gaskins AND  CURETTAGE OF UTERUS  2003, 20052008/05/17  . HERNIA REPAIR  1999   Umbilical  . KNEE SURGERY Right 07-04-2004   Knee Arthroscopy  . LAPAROSCOPIC BILATERAL SALPINGECTOMY Bilateral 02/02/2015   Procedure: LAPAROSCOPIC BILATERAL SALPINGECTOMY;  Surgeon: Nadara Mustard, MD;  Location: [facility] ORS;  Service: Gynecology;  Laterality: Bilateral;  . LAPAROSCOPIC HYSTERECTOMY N/A 02/02/2015   Procedure: HYSTERECTOMY TOTAL LAPAROSCOPIC;  Surgeon: Nadara Mustard, MD;  Location: [facility] ORS;  Service: Gynecology;  Laterality: N/A;    Family History  Problem Relation Age of Onset  . Hypertension Mother   . Hyperlipidemia Mother   . Heart Problems Father        hole in heart  . Prostate cancer Maternal Grandfather     Social History   Social History  . Marital status: Married    Spouse name: Jonny Ruiz   . Number of children: 4  . 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 Smoking status: Never Smoker  . Smokeless tobacco: Never Used  . Alcohol use No  . Drug use: No  . Sexual activity: Yes    Partners: Male   Other Topics Concern  . Not on file   Social History Narrative   First husband died suddenly in 07-05-2014   Remarried 06/07/2016     Current Outpatient Prescriptions:  .  carvedilol (COREG) 3.125 MG tablet, Take 1 tablet (3.125 mg total) by mouth 2 (two) times daily with a meal., Disp: 60 tablet, Rfl: 2 .  cyclobenzaprine (FLEXERIL) 10 MG tablet, Take 1 tablet (10 mg total) by mouth daily., Disp: 30 tablet, Rfl: 5 .  Multiple Vitamins-Minerals (CENTRUM PO), Take 1 tablet by mouth daily. Reported on 02/02/2015, Disp: , Rfl:  .  pantoprazole (PROTONIX) 40 MG tablet, Take 1 tablet (40 mg total) by mouth daily., Disp: 30 tablet, Rfl: 5 .  PARoxetine (PAXIL) 40 MG tablet, Take 1 tablet (40 mg total) by mouth daily., Disp: 30 tablet, Rfl: 5 .  Vitamin D, Ergocalciferol, (DRISDOL) 50000 units CAPS capsule, Take 1 capsule (50,000 Units  total) by mouth every 7 (seven) days., Disp: 12 capsule, Rfl: 0 .  zolpidem (AMBIEN) 10 MG tablet, Take 1 tablet (10 mg total) by mouth at bedtime as needed., Disp: 30 tablet, Rfl: 5 .  ALPRAZOLAM XR 1 MG 24 hr tablet, Take 1 tablet (1 mg total) by mouth daily. Fill June 6th and monthly thereafter, Disp: 30 tablet, Rfl: 2 .  doxycycline (VIBRA-TABS) 100 MG tablet, Take 1 tablet (100 mg total) by mouth 2 (two) times daily., Disp: 28 tablet, Rfl: 0 .  Icosapent Ethyl (VASCEPA) 1 g CAPS, Take 2 g by mouth 2 (two) times daily., Disp: 120 capsule, Rfl: 5  Allergies  Allergen Reactions  . Azithromycin Diarrhea  . Peanuts [Peanut Oil] Swelling    "swelling of lips"   . Penicillins Hives and Swelling  Has patient had a PCN reaction causing immediate rash, facial/tongue/throat swelling, SOB or lightheadedness with hypotension: Yes Has patient had a PCN reaction causing severe rash involving mucus membranes or skin necrosis: No Has patient had a PCN reaction that required hospitalization No Has patient had a PCN reaction occurring within the last 10 years: No If all of the above answers are "NO", then may proceed with Cephalosporin use.     ROS  Constitutional: Negative for fever , positive for weight change.  Respiratory: Negative for cough and shortness of breath.   Cardiovascular: Negative for chest pain or palpitations.  Gastrointestinal: Negative for abdominal pain, no bowel changes, she has noticed nausea  Musculoskeletal: Negative for gait problem or joint swelling.  Skin:positive for rash, irritation from tick bite.  Neurological: Negative for dizziness , positive for headache.  No other specific complaints in a complete review of systems (except as listed in HPI above).  Objective  Vitals:   07/02/16 1153  BP: 102/60  Pulse: (!) 117  Resp: 16  Temp: 98.2 F (36.8 C)  TempSrc: Oral  SpO2: 95%  Weight: 185 lb 12.8 oz (84.3 kg)    Body mass index is 28.25 kg/m.  Physical  Exam  Constitutional: Patient appears well-developed and well-nourished. Overweight   No distress.  HEENT: head atraumatic, normocephalic, pupils equal and reactive to light,  neck supple, throat within normal limits Skin: excoriation on her back, from scratching also has the mark from recent removal of ticks on lower abdomen and left axilla, she has an area of erythema and induration on left scapular area, but not bulls eye Cardiovascular: Normal rate, regular rhythm and normal heart sounds.  No murmur heard. No BLE edema. Pulmonary/Chest: Effort normal and breath sounds normal. No respiratory distress. Abdominal: Soft.  There is no tenderness. Psychiatric: Patient has a normal mood and affect. behavior is normal. Judgment and thought content normal.  Recent Results (from the past 2160 hour(s))  CMP12+LP+TP+TSH+6AC+CBC/D/Plt     Status: Abnormal   Collection Time: 04/26/16  8:36 AM  Result Value Ref Range   Glucose 103 (H) 65 - 99 mg/dL   Uric Acid 4.7 2.5 - 7.1 mg/dL    Comment:            Therapeutic target for gout patients: <6.0   BUN 17 6 - 24 mg/dL   Creatinine, Ser 0.981.01 (H) 0.57 - 1.00 mg/dL   GFR calc non Af Amer 67 >59 mL/min/1.73   GFR calc Af Amer 78 >59 mL/min/1.73   BUN/Creatinine Ratio 17 9 - 23   Sodium 142 134 - 144 mmol/L   Potassium 4.2 3.5 - 5.2 mmol/L   Chloride 100 96 - 106 mmol/L   Calcium 9.1 8.7 - 10.2 mg/dL   Phosphorus 3.4 2.5 - 4.5 mg/dL   Total Protein 6.7 6.0 - 8.5 g/dL   Albumin 4.1 3.5 - 5.5 g/dL   Globulin, Total 2.6 1.5 - 4.5 g/dL   Albumin/Globulin Ratio 1.6 1.2 - 2.2   Bilirubin Total 0.6 0.0 - 1.2 mg/dL   Alkaline Phosphatase 79 39 - 117 IU/L   LDH 159 119 - 226 IU/L   AST 13 0 - 40 IU/L   ALT 13 0 - 32 IU/L   GGT 15 0 - 60 IU/L   Iron 127 27 - 159 ug/dL   Cholesterol, Total 119242 (H) 100 - 199 mg/dL   Triglycerides 147321 (H) 0 - 149 mg/dL   HDL 46 >82>39 mg/dL   VLDL  Cholesterol Cal 64 (H) 5 - 40 mg/dL   LDL Calculated 161 (H) 0 - 99 mg/dL    Chol/HDL Ratio 5.3 (H) 0.0 - 4.4 ratio units    Comment:                                   T. Chol/HDL Ratio                                             Men  Women                               1/2 Avg.Risk  3.4    3.3                                   Avg.Risk  5.0    4.4                                2X Avg.Risk  9.6    7.1                                3X Avg.Risk 23.4   11.0    Estimated CHD Risk 1.4 (H) 0.0 - 1.0  times avg.    Comment:                                   T. Chol/HDL Ratio                                             Men  Women                               1/2 Avg.Risk  3.4    3.3                                   Avg.Risk  5.0    4.4                                2X Avg.Risk  9.6    7.1                                3X Avg.Risk 23.4   11.0 The CHD Risk is based on the T. Chol/HDL ratio.  Other factors affect CHD Risk such as hypertension, smoking, diabetes, severe obesity, and family history of pre- mature CHD.    TSH 1.860 0.450 - 4.500 uIU/mL   T4, Total 5.4 4.5 - 12.0 ug/dL   T3 Uptake Ratio 23 (L) 24 - 39 %   Free Thyroxine Index 1.2 1.2 - 4.9  WBC 7.3 3.4 - 10.8 x10E3/uL   RBC 4.64 3.77 - 5.28 x10E6/uL   Hemoglobin 13.8 11.1 - 15.9 g/dL   Hematocrit 98.1 19.1 - 46.6 %   MCV 89 79 - 97 fL   MCH 29.7 26.6 - 33.0 pg   MCHC 33.3 31.5 - 35.7 g/dL   RDW 47.8 29.5 - 62.1 %   Platelets 237 150 - 379 x10E3/uL   Neutrophils 75 Not Estab. %   Lymphs 21 Not Estab. %   Monocytes 3 Not Estab. %   Eos 1 Not Estab. %   Basos 0 Not Estab. %   Neutrophils Absolute 5.5 1.4 - 7.0 x10E3/uL   Lymphocytes Absolute 1.5 0.7 - 3.1 x10E3/uL   Monocytes Absolute 0.3 0.1 - 0.9 x10E3/uL   EOS (ABSOLUTE) 0.1 0.0 - 0.4 x10E3/uL   Basophils Absolute 0.0 0.0 - 0.2 x10E3/uL   Immature Granulocytes 0 Not Estab. %   Immature Grans (Abs) 0.0 0.0 - 0.1 x10E3/uL  Hgb A1c w/o eAG     Status: None   Collection Time: 04/26/16  8:36 AM  Result Value Ref Range   Hgb A1c MFr Bld 5.5  4.8 - 5.6 %    Comment:          Pre-diabetes: 5.7 - 6.4          Diabetes: >6.4          Glycemic control for adults with diabetes: <7.0   RPR Qual     Status: None   Collection Time: 04/26/16  8:36 AM  Result Value Ref Range   RPR Ser Ql Non Reactive Non Reactive  VITAMIN D 25 Hydroxy (Vit-D Deficiency, Fractures)     Status: Abnormal   Collection Time: 04/26/16  8:36 AM  Result Value Ref Range   Vit D, 25-Hydroxy 17.7 (L) 30.0 - 100.0 ng/mL    Comment: Vitamin D deficiency has been defined by the Institute of Medicine and an Endocrine Society practice guideline as a level of serum 25-OH vitamin D less than 20 ng/mL (1,2). The Endocrine Society went on to further define vitamin D insufficiency as a level between 21 and 29 ng/mL (2). 1. IOM (Institute of Medicine). 2010. Dietary reference    intakes for calcium and D. [place]: The    [employer]. 2. Holick MF, Binkley Medora, Bischoff-Ferrari HA, et al.    Evaluation, treatment, and prevention of vitamin D    deficiency: an Endocrine Society clinical practice    guideline. JCEM. 2011 Jul; 96(7):1911-30.   HIV antibody     Status: None   Collection Time: 04/26/16  8:36 AM  Result Value Ref Range   HIV Screen 4th Generation wRfx Non Reactive Non Reactive      PHQ2/9: Depression screen [facility] 2/9 07/02/2016 04/25/2016 01/24/2016 11/01/2015 07/31/2015  Decreased Interest 0 0 0 0 2  Down, Depressed, Hopeless 0 0 0 0 1  PHQ - 2 Score 0 0 0 0 3  Altered sleeping - - - - 2  Tired, decreased energy - - - - 0  Change in appetite - - - - 0  Feeling bad or failure about yourself  - - - - 0  Trouble concentrating - - - - 0  Moving slowly or fidgety/restless - - - - 0  Suicidal thoughts - - - - 0  PHQ-9 Score - - - - 5  Difficult doing work/chores - - - - Somewhat difficult     Fall Risk: Fall Risk  07/02/2016 04/25/2016 01/24/2016 11/01/2015 07/31/2015  Falls in the past year? No No No Yes No  Number falls in past yr: - - -  2 or more -  Injury with Fall? - - - No -     Functional Status Survey: Is the patient deaf or have difficulty hearing?: No Does the patient have difficulty seeing, even when wearing glasses/contacts?: No Does the patient have difficulty concentrating, remembering, or making decisions?: No Does the patient have difficulty walking or climbing stairs?: No Does the patient have difficulty dressing or bathing?: No Does the patient have difficulty doing errands alone such as visiting a doctor's office or shopping?: No    Assessment & Plan  1. Tick bite, initial encounter  She has nausea, headache - doxycycline (VIBRA-TABS) 100 MG tablet; Take 1 tablet (100 mg total) by mouth 2 (two) times daily.  Dispense: 28 tablet; Refill: 0  2. GAD (generalized anxiety disorder)  - PARoxetine (PAXIL) 40 MG tablet; Take 1 tablet (40 mg total) by mouth daily.  Dispense: 30 tablet; Refill: 5 - ALPRAZOLAM XR 1 MG 24 hr tablet; Take 1 tablet (1 mg total) by mouth daily. Fill June 6th and monthly thereafter  Dispense: 30 tablet; Refill: 2  3. Insomnia, persistent  - zolpidem (AMBIEN) 10 MG tablet; Take 1 tablet (10 mg total) by mouth at bedtime as needed.  Dispense: 30 tablet; Refill: 5  4. Pre-diabetes  Discussed life style modification  5. Hypertriglyceridemia  She has not been eating healthy, we will add Vascepa  - Icosapent Ethyl (VASCEPA) 1 g CAPS; Take 2 g by mouth 2 (two) times daily.  Dispense: 120 capsule; Refill: 5  6. Gastric reflux  - pantoprazole (PROTONIX) 40 MG tablet; Take 1 tablet (40 mg total) by mouth daily.  Dispense: 30 tablet; Refill: 5  7. Neck pain  - cyclobenzaprine (FLEXERIL) 10 MG tablet; Take 1 tablet (10 mg total) by mouth daily.  Dispense: 30 tablet; Refill: 5  8. Tachycardia  She has been unable to take coreg twice daily, taking it at night, and symptoms are under control

## 2016-07-16 ENCOUNTER — Other Ambulatory Visit: Payer: Self-pay | Admitting: Family Medicine

## 2016-07-16 ENCOUNTER — Encounter: Payer: Self-pay | Admitting: Family Medicine

## 2016-07-16 DIAGNOSIS — K219 Gastro-esophageal reflux disease without esophagitis: Secondary | ICD-10-CM

## 2016-07-16 NOTE — Telephone Encounter (Signed)
Patient requesting refill of Protonix to [facility].  

## 2016-07-16 NOTE — Telephone Encounter (Signed)
Protonix was refilled on 07/02/2016 [facility]. Please call the patient and let her know this. If she is unable to obtain the refills, please contact the pharmacy to determine if there is an issue with the script or if [facility] just [facility]. Thank you!

## 2016-07-18 ENCOUNTER — Encounter: Payer: Self-pay | Admitting: Family Medicine

## 2016-07-18 ENCOUNTER — Other Ambulatory Visit: Payer: Self-pay | Admitting: Family Medicine

## 2016-07-18 DIAGNOSIS — K219 Gastro-esophageal reflux disease without esophagitis: Secondary | ICD-10-CM

## 2016-07-18 MED ORDER — PANTOPRAZOLE SODIUM 40 MG PO TBEC: 40.0000 mg | DELAYED_RELEASE_TABLET | Freq: Every day | ORAL | 5 refills | Status: DC

## 2016-07-29 ENCOUNTER — Other Ambulatory Visit: Payer: Self-pay | Admitting: Family Medicine

## 2016-07-29 DIAGNOSIS — G4700 Insomnia, unspecified: Secondary | ICD-10-CM

## 2016-08-12 ENCOUNTER — Other Ambulatory Visit: Payer: Self-pay | Admitting: Family Medicine

## 2016-08-12 DIAGNOSIS — F411 Generalized anxiety disorder: Secondary | ICD-10-CM

## 2016-09-05 ENCOUNTER — Other Ambulatory Visit: Payer: Self-pay | Admitting: Family Medicine

## 2016-09-05 DIAGNOSIS — F411 Generalized anxiety disorder: Secondary | ICD-10-CM

## 2016-09-05 NOTE — Telephone Encounter (Signed)
Patient requesting refill of Alprazolam to [facility].  

## 2016-09-12 ENCOUNTER — Other Ambulatory Visit: Payer: Self-pay | Admitting: Family Medicine

## 2016-09-12 DIAGNOSIS — F411 Generalized anxiety disorder: Secondary | ICD-10-CM

## 2016-09-13 ENCOUNTER — Other Ambulatory Visit: Payer: Self-pay | Admitting: Family Medicine

## 2016-09-13 DIAGNOSIS — M542 Cervicalgia: Secondary | ICD-10-CM

## 2016-09-13 NOTE — Telephone Encounter (Signed)
Patient requesting refill of Flexeril to [facility].  

## 2016-09-28 ENCOUNTER — Other Ambulatory Visit: Payer: Self-pay | Admitting: Family Medicine

## 2016-09-28 DIAGNOSIS — G4700 Insomnia, unspecified: Secondary | ICD-10-CM

## 2016-09-30 NOTE — Telephone Encounter (Signed)
Patient requesting refill of Ambien to [facility]. 

## 2016-10-10 ENCOUNTER — Ambulatory Visit (INDEPENDENT_AMBULATORY_CARE_PROVIDER_SITE_OTHER): Payer: BLUE CROSS/BLUE SHIELD | Admitting: Family Medicine

## 2016-10-10 ENCOUNTER — Encounter: Payer: Self-pay | Admitting: Family Medicine

## 2016-10-10 VITALS — BP 108/64 | HR 104 | Temp 98.0°F | Resp 16 | Ht 68.0 in | Wt 189.1 lb

## 2016-10-10 DIAGNOSIS — R7303 Prediabetes: Secondary | ICD-10-CM | POA: Diagnosis not present

## 2016-10-10 DIAGNOSIS — E781 Pure hyperglyceridemia: Secondary | ICD-10-CM | POA: Diagnosis not present

## 2016-10-10 DIAGNOSIS — M542 Cervicalgia: Secondary | ICD-10-CM

## 2016-10-10 DIAGNOSIS — K581 Irritable bowel syndrome with constipation: Secondary | ICD-10-CM | POA: Diagnosis not present

## 2016-10-10 DIAGNOSIS — Z23 Encounter for immunization: Secondary | ICD-10-CM | POA: Diagnosis not present

## 2016-10-10 DIAGNOSIS — G4700 Insomnia, unspecified: Secondary | ICD-10-CM | POA: Diagnosis not present

## 2016-10-10 DIAGNOSIS — E559 Vitamin D deficiency, unspecified: Secondary | ICD-10-CM

## 2016-10-10 DIAGNOSIS — F411 Generalized anxiety disorder: Secondary | ICD-10-CM | POA: Diagnosis not present

## 2016-10-10 DIAGNOSIS — R000 Tachycardia, unspecified: Secondary | ICD-10-CM

## 2016-10-10 DIAGNOSIS — K219 Gastro-esophageal reflux disease without esophagitis: Secondary | ICD-10-CM

## 2016-10-10 DIAGNOSIS — R1013 Epigastric pain: Secondary | ICD-10-CM | POA: Diagnosis not present

## 2016-10-10 LAB — CBC WITH DIFFERENTIAL/PLATELET
BASOS ABS: 0 {cells}/uL (ref 0–200)
Basophils Relative: 0 %
EOS ABS: 102 {cells}/uL (ref 15–500)
Eosinophils Relative: 1 %
HEMATOCRIT: 40.7 % (ref 35.0–45.0)
Hemoglobin: 13.9 g/dL (ref 11.7–15.5)
LYMPHS PCT: 20 %
Lymphs Abs: 2040 cells/uL (ref 850–3900)
MCH: 30.8 pg (ref 27.0–33.0)
MCHC: 34.2 g/dL (ref 32.0–36.0)
MCV: 90.2 fL (ref 80.0–100.0)
MONO ABS: 408 {cells}/uL (ref 200–950)
MPV: 9 fL (ref 7.5–12.5)
Monocytes Relative: 4 %
NEUTROS PCT: 75 %
Neutro Abs: 7650 cells/uL (ref 1500–7800)
Platelets: 261 10*3/uL (ref 140–400)
RBC: 4.51 MIL/uL (ref 3.80–5.10)
RDW: 13.8 % (ref 11.0–15.0)
WBC: 10.2 10*3/uL (ref 3.8–10.8)

## 2016-10-10 MED ORDER — PAROXETINE HCL 40 MG PO TABS: 40.0000 mg | ORAL_TABLET | Freq: Every day | ORAL | 1 refills | Status: DC

## 2016-10-10 MED ORDER — ZOLPIDEM TARTRATE 10 MG PO TABS: ORAL_TABLET | ORAL | 2 refills | Status: DC

## 2016-10-10 MED ORDER — ICOSAPENT ETHYL 1 G PO CAPS: 2.0000 g | ORAL_CAPSULE | Freq: Two times a day (BID) | ORAL | 1 refills | Status: DC

## 2016-10-10 MED ORDER — CYCLOBENZAPRINE HCL 10 MG PO TABS: 10.0000 mg | ORAL_TABLET | Freq: Every day | ORAL | 1 refills | Status: DC

## 2016-10-10 MED ORDER — CARVEDILOL 3.125 MG PO TABS: 3.1250 mg | ORAL_TABLET | Freq: Two times a day (BID) | ORAL | 1 refills | Status: DC

## 2016-10-10 MED ORDER — ALPRAZOLAM XR 1 MG PO TB24: 1.0000 mg | ORAL_TABLET | Freq: Every day | ORAL | 2 refills | Status: DC
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
## 2016-10-10 NOTE — Addendum Note (Signed)
Addended by: Alba Cory F on: 10/10/2016 09:56 AM   Modules accepted: Orders

## 2016-10-11 LAB — COMPLETE METABOLIC PANEL WITH GFR
ALT: 19 U/L (ref 6–29)
AST: 13 U/L (ref 10–35)
Albumin: 4.3 g/dL (ref 3.6–5.1)
Alkaline Phosphatase: 66 U/L (ref 33–115)
BUN: 14 mg/dL (ref 7–25)
CALCIUM: 9.1 mg/dL (ref 8.6–10.2)
CHLORIDE: 104 mmol/L (ref 98–110)
CO2: 28 mmol/L (ref 20–32)
CREATININE: 0.97 mg/dL (ref 0.50–1.10)
GFR, EST AFRICAN AMERICAN: 81 mL/min (ref >=60)
GFR, EST NON AFRICAN AMERICAN: 70 mL/min (ref >=60)
Glucose, Bld: 105 mg/dL — ABNORMAL HIGH (ref 65–99)
POTASSIUM: 4.6 mmol/L (ref 3.5–5.3)
Sodium: 140 mmol/L (ref 135–146)
Total Bilirubin: 0.4 mg/dL (ref 0.2–1.2)
Total Protein: 6.6 g/dL (ref 6.1–8.1)

## 2016-10-11 LAB — LIPID PANEL
Cholesterol: 233 mg/dL — ABNORMAL HIGH (ref <200)
HDL: 45 mg/dL — ABNORMAL LOW (ref >50)
LDL CALC: 127 mg/dL — AB (ref <100)
TRIGLYCERIDES: 305 mg/dL — AB (ref <150)
Total CHOL/HDL Ratio: 5.2 Ratio — ABNORMAL HIGH (ref <5.0)
VLDL: 61 mg/dL — ABNORMAL HIGH (ref <30)

## 2016-10-11 LAB — HEMOGLOBIN A1C
HEMOGLOBIN A1C: 5.4 % (ref <5.7)
Mean Plasma Glucose: 108 mg/dL

## 2016-10-11 LAB — H. PYLORI BREATH TEST: H. PYLORI BREATH TEST: NOT DETECTED

## 2016-10-11 LAB — INSULIN, FASTING: INSULIN FASTING, SERUM: 14.4 u[IU]/mL (ref 2.0–19.6)

## 2016-10-12 LAB — VITAMIN D 25 HYDROXY (VIT D DEFICIENCY, FRACTURES): Vit D, 25-Hydroxy: 24 ng/mL — ABNORMAL LOW (ref 30–100)

## 2016-11-10 DIAGNOSIS — S60221A Contusion of right hand, initial encounter: Secondary | ICD-10-CM | POA: Diagnosis not present

## 2016-11-13 ENCOUNTER — Other Ambulatory Visit: Payer: Self-pay

## 2016-11-13 ENCOUNTER — Encounter: Payer: Self-pay | Admitting: Gastroenterology

## 2016-11-13 ENCOUNTER — Telehealth: Payer: Self-pay | Admitting: Gastroenterology

## 2016-11-13 ENCOUNTER — Ambulatory Visit (INDEPENDENT_AMBULATORY_CARE_PROVIDER_SITE_OTHER): Payer: BLUE CROSS/BLUE SHIELD | Admitting: Gastroenterology

## 2016-11-13 VITALS — BP 95/67 | HR 105 | Temp 98.1°F | Ht 68.0 in | Wt 193.2 lb

## 2016-11-13 DIAGNOSIS — R1310 Dysphagia, unspecified: Secondary | ICD-10-CM

## 2016-11-13 DIAGNOSIS — Z1211 Encounter for screening for malignant neoplasm of colon: Secondary | ICD-10-CM

## 2016-11-13 DIAGNOSIS — Z1212 Encounter for screening for malignant neoplasm of rectum: Secondary | ICD-10-CM

## 2016-11-13 MED ORDER — POLYETHYLENE GLYCOL 3350 17 GM/SCOOP PO POWD: ORAL | 3 refills | Status: DC

## 2016-11-13 NOTE — Telephone Encounter (Signed)
Patient LVM and has a question regarding the bowel prep.

## 2016-11-13 NOTE — Progress Notes (Signed)
Alicia Mood MD, MRCP(U.K) [address]  [address]  [place], [place] [address]  Main: [phone]  Fax: [phone]   Gastroenterology Consultation  Referring Provider:     Alba Cory, MD Primary Care Physician:  Alba Cory, MD Primary Gastroenterologist:  Dr. Wyline [name]  Reason for Consultation:     GERD,dyspepsia.         HPI:   Alicia [name] Note Alicia [name] is a 46 y.o. y/o female referred for consultation & management  by Dr. Carlynn Purl, Danna Hefty, MD.    She has been referred for GERD and dyspepsia. CT chest/abdomen and pelvis in 01/2016 was normal .   Labs 09/2016 :  H pylori breath test -negative ,CBC,CMP-normal .Last attempt at colonoscopy in 2014 was incomplete due to poor prep.  She says she is here today to see me   In 2015 started having reflux after a tummy tuck . She describes her symptoms as food coming back up , water comes up, but Gatorade or other drinks go down . After a few ounces feels bloated. She does have heartburn to the point that she cant breath. She takes Protonix every day , in the morning with her food. Unsure of the dose. Likely 20 mg . She was taken off the meds because of the concern for side effects and she ended up in the hospital due to recurrence of symptoms. She has had issues when food slows down in her throat and causes pain. Does have coughing at times.    She says that she does not have a bowel movement for a few days, for many years, when she does have a bowel movement , small and hard to push out, after a few days has a big bowel movement and then feels better. When she does not have a bowle movement feels like she looks. No family history of colon cancer, mother had colon polyps. Not on any medications. Did try mirlax  Past Medical History:  Diagnosis Date  . Anemia   . Anxiety   . Depression   . GERD (gastroesophageal reflux disease)   . Hyperlipidemia   . IBS (irritable bowel syndrome)   . Restless leg syndrome     Past  Surgical History:  Procedure Laterality Date  . ABDOMINOPLASTY  Feb. 2015  . BREAST SURGERY Bilateral December 2015   Breast Reduction  . CESAREAN SECTION  2011  . CYSTOSCOPY  02/02/2015   Procedure: CYSTOSCOPY;  Surgeon: Nadara Mustard, MD;  Location: [facility] ORS;  Service: Gynecology;;  . Joya Gaskins AND CURETTAGE OF UTERUS  2003, 2005, 2008  . HERNIA REPAIR  1999   Umbilical  . KNEE SURGERY Right 2006   Knee Arthroscopy  . LAPAROSCOPIC BILATERAL SALPINGECTOMY Bilateral 02/02/2015   Procedure: LAPAROSCOPIC BILATERAL SALPINGECTOMY;  Surgeon: Nadara Mustard, MD;  Location: [facility] ORS;  Service: Gynecology;  Laterality: Bilateral;  . LAPAROSCOPIC HYSTERECTOMY N/A 02/02/2015   Procedure: HYSTERECTOMY TOTAL LAPAROSCOPIC;  Surgeon: Nadara Mustard, MD;  Location: [facility] ORS;  Service: Gynecology;  Laterality: N/A;    Prior to Admission medications   Medication Sig Start Date End Date Taking? Authorizing Provider  ALPRAZOLAM XR 1 MG 24 hr tablet Take 1 tablet (1 mg total) by mouth daily. Fill June 6th and monthly thereafter 10/10/16   Alba Cory, MD  carvedilol (COREG) 3.125 MG tablet Take 1 tablet (3.125 mg total) by mouth 2 (two) times daily with a meal. 10/10/16   Carlynn Purl, Danna Hefty, MD  cyclobenzaprine (FLEXERIL) 10 MG tablet  Take 1 tablet (10 mg total) by mouth daily. 10/10/16   Alba Cory, MD  doxycycline (VIBRA-TABS) 100 MG tablet  11/10/16   [provider]  Icosapent Ethyl (VASCEPA) 1 g CAPS Take 2 g by mouth 2 (two) times daily. 10/10/16   Alba Cory, MD  Multiple Vitamins-Minerals (CENTRUM PO) Take 1 tablet by mouth daily. Reported on 02/02/2015    [provider]  pantoprazole (PROTONIX) 40 MG tablet Take 1 tablet (40 mg total) by mouth daily. 07/18/16   Doren Custard, FNP  PARoxetine (PAXIL) 40 MG tablet Take 1 tablet (40 mg total) by mouth daily. 10/10/16   Alba Cory, MD  zolpidem (AMBIEN) 10 MG tablet take 1 tablet by mouth at bedtime if needed 10/10/16    Alba Cory, MD    Family History  Problem Relation Age of Onset  . Hypertension Mother   . Hyperlipidemia Mother   . Heart Problems Father        hole in heart  . Prostate cancer Maternal Grandfather      Social History  Substance Use Topics  . Smoking status: Never Smoker  . Smokeless tobacco: Never Used  . Alcohol use No    Allergies as of 11/13/2016 - Review Complete 10/10/2016  Allergen Reaction Noted  . Azithromycin Diarrhea 08/14/2014  . Peanuts [peanut oil] Swelling 01/30/2015  . Penicillins Hives and Swelling 01/18/2012    Review of Systems:    All systems reviewed and negative except where noted in HPI.   Physical Exam:  LMP 01/15/2015 (Exact Date)  Patient's last menstrual period was 01/15/2015 (exact date). Psych:  Alert and cooperative. Normal [name] and affect. General:   Alert,  Well-developed, well-nourished, pleasant and cooperative in NAD Head:  Normocephalic and atraumatic. Eyes:  Sclera clear, no icterus.   Conjunctiva pink. Ears:  Normal auditory acuity. Nose:  No deformity, discharge, or lesions. Mouth:  No deformity or lesions,oropharynx pink & moist. Neck:  Supple; no masses or thyromegaly. Lungs:  Respirations even and unlabored.  Clear throughout to auscultation.   No wheezes, crackles, or rhonchi. No acute distress. Heart:  Regular rate and rhythm; no murmurs, clicks, rubs, or gallops. Abdomen:  Normal bowel sounds.  No bruits.  Soft, non-tender and non-distended without masses, hepatosplenomegaly or hernias noted.  No guarding or rebound tenderness.    Extremities:  No clubbing or edema.  No cyanosis. Neurologic:  Alert and oriented x3;  grossly normal neurologically. Skin:  Intact without significant lesions or rashes. No jaundice. Lymph Nodes:  No significant cervical adenopathy. Psych:  Alert and cooperative. Normal [name] and affect.  Imaging Studies: No results found.  Assessment and Plan:   Alicia [name] is a 46 y.o. y/o  female has been referred for GERD and IBS-C. Presently not on a PPI, will commence on PPI and advised her to take it before her breakfast rather than with her breakfast. If her symptoms resolve then would suggest GERD as the cause. In addition she has no fiber in her diet , history of constipation    1. Protonix 40 mg once daily  2. EGD to r/o stricture  3. Colonoscopy for colorectal cancer screening due to family history of colon polyps 4. Daily miralax-= if no better at next visit will try a course of linzess. 5. Patient information on high fiber diet .    I have discussed alternative options, risks & benefits,  which include, but are not limited to, bleeding, infection, perforation,respiratory complication & drug  reaction.  The patient agrees with this plan & written consent will be obtained.    Follow up in 6 weeks   Dr Alicia Mood MD,MRCP(U.K)

## 2016-11-15 ENCOUNTER — Telehealth: Payer: Self-pay

## 2016-11-15 NOTE — Telephone Encounter (Signed)
Returned patients call. LVM for call back

## 2016-11-20 ENCOUNTER — Other Ambulatory Visit: Payer: Self-pay

## 2016-11-20 ENCOUNTER — Telehealth: Payer: Self-pay | Admitting: Gastroenterology

## 2016-11-20 MED ORDER — NA SULFATE-K SULFATE-MG SULF 17.5-3.13-1.6 GM/177ML PO SOLN: 1.0000 | Freq: Once | ORAL | 0 refills | Status: AC

## 2016-11-20 NOTE — Telephone Encounter (Signed)
Please send Rx for bowel prep to [facility].

## 2016-11-22 DIAGNOSIS — S60221D Contusion of right hand, subsequent encounter: Secondary | ICD-10-CM | POA: Diagnosis not present

## 2016-11-26 ENCOUNTER — Encounter: Payer: Self-pay | Admitting: *Deleted

## 2016-11-26 ENCOUNTER — Ambulatory Visit: Payer: BLUE CROSS/BLUE SHIELD | Admitting: Certified Registered"

## 2016-11-26 ENCOUNTER — Encounter
Admission: RE | Disposition: A | Discharge status: Home / Self Care | Payer: Self-pay | Source: Ambulatory Visit | Attending: Gastroenterology

## 2016-11-26 ENCOUNTER — Ambulatory Visit
Admission: RE | Admit: 2016-11-26 | Discharge: 2016-11-26 | Disposition: A | Discharge status: Home / Self Care | Payer: BLUE CROSS/BLUE SHIELD | Source: Ambulatory Visit | Attending: Gastroenterology | Admitting: Gastroenterology

## 2016-11-26 DIAGNOSIS — K6389 Other specified diseases of intestine: Secondary | ICD-10-CM

## 2016-11-26 DIAGNOSIS — F419 Anxiety disorder, unspecified: Secondary | ICD-10-CM | POA: Insufficient documentation

## 2016-11-26 DIAGNOSIS — G2581 Restless legs syndrome: Secondary | ICD-10-CM | POA: Diagnosis not present

## 2016-11-26 DIAGNOSIS — Z86010 Personal history of colonic polyps: Secondary | ICD-10-CM | POA: Diagnosis not present

## 2016-11-26 DIAGNOSIS — K5730 Diverticulosis of large intestine without perforation or abscess without bleeding: Secondary | ICD-10-CM | POA: Insufficient documentation

## 2016-11-26 DIAGNOSIS — Z8719 Personal history of other diseases of the digestive system: Secondary | ICD-10-CM | POA: Diagnosis not present

## 2016-11-26 DIAGNOSIS — K640 First degree hemorrhoids: Secondary | ICD-10-CM | POA: Diagnosis not present

## 2016-11-26 DIAGNOSIS — R1310 Dysphagia, unspecified: Secondary | ICD-10-CM

## 2016-11-26 DIAGNOSIS — Z880 Allergy status to penicillin: Secondary | ICD-10-CM | POA: Insufficient documentation

## 2016-11-26 DIAGNOSIS — Z888 Allergy status to other drugs, medicaments and biological substances status: Secondary | ICD-10-CM | POA: Diagnosis not present

## 2016-11-26 DIAGNOSIS — D123 Benign neoplasm of transverse colon: Secondary | ICD-10-CM | POA: Diagnosis not present

## 2016-11-26 DIAGNOSIS — K589 Irritable bowel syndrome without diarrhea: Secondary | ICD-10-CM | POA: Diagnosis not present

## 2016-11-26 DIAGNOSIS — K5790 Diverticulosis of intestine, part unspecified, without perforation or abscess without bleeding: Secondary | ICD-10-CM | POA: Diagnosis not present

## 2016-11-26 DIAGNOSIS — Z1212 Encounter for screening for malignant neoplasm of rectum: Secondary | ICD-10-CM

## 2016-11-26 DIAGNOSIS — K219 Gastro-esophageal reflux disease without esophagitis: Secondary | ICD-10-CM | POA: Insufficient documentation

## 2016-11-26 DIAGNOSIS — K648 Other hemorrhoids: Secondary | ICD-10-CM | POA: Diagnosis not present

## 2016-11-26 DIAGNOSIS — K2970 Gastritis, unspecified, without bleeding: Secondary | ICD-10-CM

## 2016-11-26 DIAGNOSIS — K529 Noninfective gastroenteritis and colitis, unspecified: Secondary | ICD-10-CM | POA: Diagnosis not present

## 2016-11-26 DIAGNOSIS — Z1211 Encounter for screening for malignant neoplasm of colon: Secondary | ICD-10-CM

## 2016-11-26 DIAGNOSIS — F329 Major depressive disorder, single episode, unspecified: Secondary | ICD-10-CM | POA: Diagnosis not present

## 2016-11-26 DIAGNOSIS — Z79899 Other long term (current) drug therapy: Secondary | ICD-10-CM | POA: Insufficient documentation

## 2016-11-26 DIAGNOSIS — K635 Polyp of colon: Secondary | ICD-10-CM | POA: Diagnosis not present

## 2016-11-26 DIAGNOSIS — K222 Esophageal obstruction: Secondary | ICD-10-CM | POA: Diagnosis not present

## 2016-11-26 DIAGNOSIS — E785 Hyperlipidemia, unspecified: Secondary | ICD-10-CM | POA: Diagnosis not present

## 2016-11-26 HISTORY — PX: ESOPHAGOGASTRODUODENOSCOPY (EGD) WITH PROPOFOL: SHX5813

## 2016-11-26 HISTORY — PX: COLONOSCOPY WITH PROPOFOL: SHX5780

## 2016-11-26 SURGERY — ESOPHAGOGASTRODUODENOSCOPY (EGD) WITH PROPOFOL
Anesthesia: General

## 2016-11-26 MED ORDER — MIDAZOLAM HCL 2 MG/2ML IJ SOLN
INTRAMUSCULAR | Status: AC
  Filled 2016-11-26: qty 2

## 2016-11-26 MED ORDER — PROPOFOL 10 MG/ML IV BOLUS
INTRAVENOUS | Status: DC | PRN
Start: 2016-11-26 — End: 2016-11-26
  Administered 2016-11-26: 70 mg via INTRAVENOUS
  Administered 2016-11-26: 30 mg via INTRAVENOUS

## 2016-11-26 MED ORDER — SODIUM CHLORIDE 0.9 % IV SOLN
INTRAVENOUS | Status: DC
  Administered 2016-11-26: 1000 mL via INTRAVENOUS

## 2016-11-26 MED ORDER — LIDOCAINE HCL (CARDIAC) 20 MG/ML IV SOLN
INTRAVENOUS | Status: DC | PRN
  Administered 2016-11-26: 50 mg via INTRAVENOUS

## 2016-11-26 MED ORDER — PROPOFOL 500 MG/50ML IV EMUL
INTRAVENOUS | Status: DC | PRN
  Administered 2016-11-26: 150 ug/kg/min via INTRAVENOUS

## 2016-11-26 MED ORDER — PROPOFOL 500 MG/50ML IV EMUL
INTRAVENOUS | Status: AC
  Filled 2016-11-26: qty 50

## 2016-11-26 MED ORDER — MIDAZOLAM HCL 2 MG/2ML IJ SOLN
INTRAMUSCULAR | Status: DC | PRN
  Administered 2016-11-26: 2 mg via INTRAVENOUS

## 2016-11-26 MED ORDER — LABETALOL HCL 5 MG/ML IV SOLN
INTRAVENOUS | Status: DC | PRN
  Administered 2016-11-26: 5 mg via INTRAVENOUS

## 2016-11-26 MED ORDER — LABETALOL HCL 5 MG/ML IV SOLN
INTRAVENOUS | Status: AC
  Filled 2016-11-26: qty 4

## 2016-11-26 NOTE — Anesthesia Post-op Follow-up Note (Signed)
Anesthesia QCDR form completed.        

## 2016-11-26 NOTE — Anesthesia Procedure Notes (Addendum)
Performed by: Aleigha Gilani Pre-anesthesia Checklist: Patient identified, Emergency Drugs available, Suction available, Patient being monitored and Timeout performed Patient Re-evaluated:Patient Re-evaluated prior to induction Oxygen Delivery Method: Nasal cannula Induction Type: IV induction Ventilation: Nasal airway inserted- appropriate to patient size        

## 2016-11-26 NOTE — Op Note (Addendum)
[facility] Gastroenterology Patient Name: Alicia [name] Procedure Date: 11/26/2016 9:09 AM MRN: [id number] Account #: [id number] Date of Birth: 12-Apr-1970 Admit Type: Outpatient Age: 46 Room: [facility] ENDO ROOM 1 Gender: Female Note Status: Finalized Procedure:            Colonoscopy Indications:          High risk colon cancer surveillance: Personal history                        of colonic polyps Providers:            Wyline Mood MD, MD Referring MD:         [name]. Sowles, MD (Referring MD) Medicines:            Monitored Anesthesia Care Complications:        No immediate complications. Procedure:            Pre-Anesthesia Assessment:                       - Prior to the procedure, a History and Physical was                        performed, and patient medications, allergies and                        sensitivities were reviewed. The patient's tolerance of                        previous anesthesia was reviewed.                       - The risks and benefits of the procedure and the                        sedation options and risks were discussed with the                        patient. All questions were answered and informed                        consent was obtained.                       - ASA Grade Assessment: III - A patient with severe                        systemic disease.                       After obtaining informed consent, the colonoscope was                        passed under direct vision. Throughout the procedure,                        the patient's blood pressure, pulse, and oxygen                        saturations were monitored continuously. The  Colonoscope was introduced through the anus and                        advanced to the the cecum, identified by the                        appendiceal orifice, IC valve and transillumination.                        The colonoscopy was performed with ease. The patient                      tolerated the procedure well. The quality of the bowel                        preparation was good. Findings:      The perianal and digital rectal examinations were normal.      Non-bleeding internal hemorrhoids were found during retroflexion. The       hemorrhoids were medium-sized and Grade I (internal hemorrhoids that do       not prolapse).      A few small-mouthed diverticula were found in the sigmoid colon.      A 6 mm polyp was found in the transverse colon. The polyp was sessile.       The polyp was removed with a cold snare. Resection and retrieval were       complete.      A localized area of mildly erythematous mucosa was found in the sigmoid       colon. This was biopsied with a cold forceps for histology.      The exam was otherwise without abnormality on direct and retroflexion       views. Impression:           - Non-bleeding internal hemorrhoids.                       - Diverticulosis in the sigmoid colon.                       - One 6 mm polyp in the transverse colon, removed with                        a cold biopsy forceps. Resected and retrieved.                       - Erythematous mucosa in the sigmoid colon. Biopsied.                       - The examination was otherwise normal on direct and                        retroflexion views. Recommendation:       - Discharge patient to home (with escort).                       - Resume previous diet.                       - Continue present medications.                       -  Await pathology results.                       - Repeat colonoscopy in 5 years for surveillance.                       - Return to GI office in 6 weeks. Procedure Code(s):    --- Professional ---                       [phone], Colonoscopy, flexible; with removal of tumor(s),                        polyp(s), or other lesion(s) by snare technique                       [id number], 59, Colonoscopy, flexible; with biopsy, single                         or multiple Diagnosis Code(s):    --- Professional ---                       Z86.010, Personal history of colonic polyps                       K64.0, First degree hemorrhoids                       D12.3, Benign neoplasm of transverse colon (hepatic                        flexure or splenic flexure)                       K63.89, Other specified diseases of intestine                       K57.30, Diverticulosis of large intestine without                        perforation or abscess without bleeding CPT copyright 2016 American Medical Association. All rights reserved. The codes documented in this report are preliminary and upon coder review may  be revised to meet current compliance requirements. Wyline Mood, MD Wyline Mood MD, MD 11/26/2016 9:50:23 AM This report has been signed electronically. Number of Addenda: 0 Note Initiated On: 11/26/2016 9:09 AM Scope Withdrawal Time: 0 hours 14 minutes 58 seconds  Total Procedure Duration: 0 hours 20 minutes 40 seconds       [facility]

## 2016-11-26 NOTE — Transfer of Care (Signed)
Immediate Anesthesia Transfer of Care Note  Patient: Alicia [name] Note Alicia [name]  Procedure(s) Performed: ESOPHAGOGASTRODUODENOSCOPY (EGD) WITH PROPOFOL (N/A ) COLONOSCOPY WITH PROPOFOL (N/A )  Patient Location: PACU  Anesthesia Type:General  Level of Consciousness: sedated  Airway & Oxygen Therapy: Patient Spontanous Breathing and Patient connected to nasal cannula oxygen  Post-op Assessment: Report given to RN and Post -op Vital signs reviewed and stable  Post vital signs: Reviewed and stable  Last Vitals:  Vitals:   11/26/16 0953 11/26/16 0956  BP: 103/67 103/67  Pulse: 90 93  Resp: 15 17  Temp: 36.6 C   SpO2: 97% 97%    Last Pain:  Vitals:   11/26/16 0953  TempSrc: Tympanic         Complications: No apparent anesthesia complications

## 2016-11-26 NOTE — H&P (Signed)
Wyline Mood MD [address] [place], [place] [address] Phone: [phone] Fax : [phone]  Primary Care Physician:  Alba Cory, MD Primary Gastroenterologist:  Dr. Wyline Mood   Pre-Procedure History & Physical: HPI:  Alicia [name] is a 46 y.o. female is here for an endoscopy and colonoscopy.   Past Medical History:  Diagnosis Date  . Anemia   . Anxiety   . Depression   . GERD (gastroesophageal reflux disease)   . Hyperlipidemia   . IBS (irritable bowel syndrome)   . Restless leg syndrome     Past Surgical History:  Procedure Laterality Date  . ABDOMINOPLASTY  Feb. 2015  . BREAST SURGERY Bilateral December 2015   Breast Reduction  . CESAREAN SECTION  2011  . CYSTOSCOPY  02/02/2015   Procedure: CYSTOSCOPY;  Surgeon: Nadara Mustard, MD;  Location: [facility] ORS;  Service: Gynecology;;  . Joya Gaskins AND CURETTAGE OF UTERUS  2003, 2005, 2008  . HERNIA REPAIR  1999   Umbilical  . KNEE SURGERY Right 2006   Knee Arthroscopy  . LAPAROSCOPIC BILATERAL SALPINGECTOMY Bilateral 02/02/2015   Procedure: LAPAROSCOPIC BILATERAL SALPINGECTOMY;  Surgeon: Nadara Mustard, MD;  Location: [facility] ORS;  Service: Gynecology;  Laterality: Bilateral;  . LAPAROSCOPIC HYSTERECTOMY N/A 02/02/2015   Procedure: HYSTERECTOMY TOTAL LAPAROSCOPIC;  Surgeon: Nadara Mustard, MD;  Location: [facility] ORS;  Service: Gynecology;  Laterality: N/A;    Prior to Admission medications   Medication Sig Start Date End Date Taking? Authorizing Provider  ALPRAZOLAM XR 1 MG 24 hr tablet Take 1 tablet (1 mg total) by mouth daily. Fill June 6th and monthly thereafter 10/10/16   Alba Cory, MD  carvedilol (COREG) 3.125 MG tablet Take 1 tablet (3.125 mg total) by mouth 2 (two) times daily with a meal. 10/10/16   Carlynn Purl, Danna Hefty, MD  cyclobenzaprine (FLEXERIL) 10 MG tablet Take 1 tablet (10 mg total) by mouth daily. 10/10/16   Alba Cory, MD  Icosapent Ethyl (VASCEPA) 1 g CAPS Take 2 g by mouth 2 (two)  times daily. 10/10/16   Alba Cory, MD  Multiple Vitamins-Minerals (CENTRUM PO) Take 1 tablet by mouth daily. Reported on 02/02/2015    [provider]  pantoprazole (PROTONIX) 40 MG tablet Take 1 tablet (40 mg total) by mouth daily. 07/18/16   Doren Custard, FNP  PARoxetine (PAXIL) 40 MG tablet Take 1 tablet (40 mg total) by mouth daily. 10/10/16   Alba Cory, MD  polyethylene glycol powder (GLYCOLAX/MIRALAX) powder 17 grams daily 11/13/16   Wyline Mood, MD  zolpidem Remus Loffler) 10 MG tablet take 1 tablet by mouth at bedtime if needed 10/10/16   Alba Cory, MD    Allergies as of 11/14/2016 - Review Complete 11/13/2016  Allergen Reaction Noted  . Azithromycin Diarrhea 08/14/2014  . Peanuts [peanut oil] Swelling 01/30/2015  . Penicillins Hives and Swelling 01/18/2012    Family History  Problem Relation Age of Onset  . Hypertension Mother   . Hyperlipidemia Mother   . Heart Problems Father        hole in heart  . Prostate cancer Maternal Grandfather     Social History   Social History  . Marital status: Married    Spouse name: Jonny Ruiz   . Number of children: 4  . 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 Smoking status: Never Smoker  . Smokeless tobacco: Never Used  . Alcohol use No  .  Drug use: No  . Sexual activity: Yes    Partners: Male   Other Topics Concern  . Not on file   Social History Narrative   First husband died suddenly in 07/09/14   Remarried 06/07/2016    Review of Systems: See HPI, otherwise negative ROS  Physical Exam: BP 131/66   Pulse (!) 107   Temp (!) 97 F (36.1 C) (Tympanic)   Resp 16   Ht  (1.727 m)   Wt 193 lb (87.5 kg)   LMP 01/15/2015 (Exact Date)   SpO2 99%   BMI 29.35 kg/m  General:   Alert,  pleasant and cooperative in NAD Head:  Normocephalic and atraumatic. Neck:  Supple; no masses or thyromegaly. Lungs:  Clear throughout to auscultation.     Heart:  Regular rate and rhythm. Abdomen:  Soft, nontender and nondistended. Normal bowel sounds, without guarding, and without rebound.   Neurologic:  Alert and  oriented x4;  grossly normal neurologically.  Impression/Plan: Alicia [name] is here for an endoscopy and colonoscopy to be performed for dysphagia and surveillance due to prior history of colon polyps.   Risks, benefits, limitations, and alternatives regarding  endoscopy, colonoscopy and possible dilation  have been reviewed with the patient.  Questions have been answered.  All parties agreeable.   Wyline Mood, MD  11/26/2016, 9:05 AM

## 2016-11-26 NOTE — Op Note (Signed)
[facility] Gastroenterology Patient Name: Alicia [name] Procedure Date: 11/26/2016 9:09 AM MRN: [id number] Account #: [id number] Date of Birth: 16-Jul-1970 Admit Type: Outpatient Age: 46 Room: [facility] ENDO ROOM 1 Gender: Female Note Status: Finalized Procedure:            Upper GI endoscopy Indications:          Dysphagia Providers:            Wyline Mood MD, MD Referring MD:         [name]. Sowles, MD (Referring MD) Medicines:            Monitored Anesthesia Care Complications:        No immediate complications. Procedure:            Pre-Anesthesia Assessment:                       - ASA Grade Assessment: III - A patient with severe                        systemic disease.                       After obtaining informed consent, the endoscope was                        passed under direct vision. Throughout the procedure,                        the patient's blood pressure, pulse, and oxygen                        saturations were monitored continuously. The Endoscope                        was introduced through the mouth, and advanced to the                        third part of duodenum. The upper GI endoscopy was                        accomplished with ease. The patient tolerated the                        procedure well. Findings:      The examined duodenum was normal.      Patchy mild inflammation characterized by congestion (edema) and       erythema was found in the gastric antrum. Biopsies were taken with a       cold forceps for histology.      A mild Schatzki ring (acquired) was found at the gastroesophageal       junction. A TTS dilator was passed through the scope. Dilation with a       15-16.5-18 mm balloon dilator was performed to 18 mm. The dilation site       was examined and showed no change.      Normal mucosa was found in the entire esophagus. Biopsies were taken       with a cold forceps for histology. Impression:           - Normal  examined duodenum.                       -  Gastritis. Biopsied.                       - Mild Schatzki ring. Dilated.                       - Normal mucosa was found in the entire esophagus.                        Biopsied. Recommendation:       - Await pathology results.                       - Perform a colonoscopy today. Procedure Code(s):    --- Professional ---                       [phone], Esophagogastroduodenoscopy, flexible, transoral;                        with transendoscopic balloon dilation of esophagus                        (less than 30 mm diameter)                       43239, Esophagogastroduodenoscopy, flexible, transoral;                        with biopsy, single or multiple Diagnosis Code(s):    --- Professional ---                       K29.70, Gastritis, unspecified, without bleeding                       K22.2, Esophageal obstruction                       R13.10, Dysphagia, unspecified CPT copyright 2016 American Medical Association. All rights reserved. The codes documented in this report are preliminary and upon coder review may  be revised to meet current compliance requirements. Wyline Mood, MD Wyline Mood MD, MD 11/26/2016 9:25:04 AM This report has been signed electronically. Number of Addenda: 0 Note Initiated On: 11/26/2016 9:09 AM      [facility]

## 2016-11-26 NOTE — Anesthesia Preprocedure Evaluation (Signed)
Anesthesia Evaluation  Patient identified by MRN, date of birth, ID band Patient awake    Reviewed: Allergy & Precautions, H&P , NPO status , Patient's Chart, lab work & pertinent test results, reviewed documented beta blocker date and time   Airway Mallampati: II   Neck ROM: full    Dental  (+) Poor Dentition   Pulmonary neg pulmonary ROS,    Pulmonary exam normal        Cardiovascular + DOE  negative cardio ROS Normal cardiovascular exam Rhythm:regular Rate:Normal     Neuro/Psych negative neurological ROS  negative psych ROS   GI/Hepatic negative GI ROS, Neg liver ROS, GERD  Medicated,  Endo/Other  negative endocrine ROS  Renal/GU negative Renal ROS  negative genitourinary   Musculoskeletal   Abdominal   Peds  Hematology negative hematology ROS (+) anemia ,   Anesthesia Other Findings Past Medical History: No date: Anemia No date: Anxiety No date: Depression No date: GERD (gastroesophageal reflux disease) No date: Hyperlipidemia No date: IBS (irritable bowel syndrome) No date: Restless leg syndrome Past Surgical History: Feb. 2015: ABDOMINOPLASTY December 2015: BREAST SURGERY; Bilateral     Comment:  Breast Reduction 2011: CESAREAN SECTION 02/02/2015: CYSTOSCOPY     Comment:  Procedure: CYSTOSCOPY;  Surgeon: Nadara Mustard, MD;                Location: [facility] ORS;  Service: Gynecology;; 2003, 2005, 2008: DILATION AND CURETTAGE OF UTERUS 1999: HERNIA REPAIR     Comment:  Umbilical 2006: KNEE SURGERY; Right     Comment:  Knee Arthroscopy 02/02/2015: LAPAROSCOPIC BILATERAL SALPINGECTOMY; Bilateral     Comment:  Procedure: LAPAROSCOPIC BILATERAL SALPINGECTOMY;                Surgeon: Nadara Mustard, MD;  Location: [facility] ORS;                Service: Gynecology;  Laterality: Bilateral; 02/02/2015: LAPAROSCOPIC HYSTERECTOMY; N/A     Comment:  Procedure: HYSTERECTOMY TOTAL LAPAROSCOPIC;  Surgeon:       Nadara Mustard, MD;  Location: [facility] ORS;  Service:               Gynecology;  Laterality: N/A; BMI    Body Mass Index:  29.35 kg/m     Reproductive/Obstetrics negative OB ROS                             Anesthesia Physical Anesthesia Plan  ASA: III  Anesthesia Plan: General   Post-op Pain Management:    Induction:   PONV Risk Score and Plan:   Airway Management Planned:   Additional Equipment:   Intra-op Plan:   Post-operative Plan:   Informed Consent: I have reviewed the patients History and Physical, chart, labs and discussed the procedure including the risks, benefits and alternatives for the proposed anesthesia with the patient or authorized representative who has indicated his/her understanding and acceptance.   Dental Advisory Given  Plan Discussed with: CRNA  Anesthesia Plan Comments:         Anesthesia Quick Evaluation

## 2016-11-27 ENCOUNTER — Encounter: Payer: Self-pay | Admitting: Gastroenterology

## 2016-11-27 ENCOUNTER — Encounter: Payer: Self-pay | Admitting: Family Medicine

## 2016-11-27 NOTE — Anesthesia Postprocedure Evaluation (Signed)
Anesthesia Post Note  Patient: Alicia [name] Note [name]  Procedure(s) Performed: ESOPHAGOGASTRODUODENOSCOPY (EGD) WITH PROPOFOL (N/A ) COLONOSCOPY WITH PROPOFOL (N/A )  Patient location during evaluation: PACU Anesthesia Type: General Level of consciousness: awake and alert Pain management: pain level controlled Vital Signs Assessment: post-procedure vital signs reviewed and stable Respiratory status: spontaneous breathing, nonlabored ventilation, respiratory function stable and patient connected to nasal cannula oxygen Cardiovascular status: blood pressure returned to baseline and stable Postop Assessment: no apparent nausea or vomiting Anesthetic complications: no     Last Vitals:  Vitals:   11/26/16 1043 11/26/16 1053  BP: 101/70 98/63  Pulse: 84 80  Resp: 17 17  Temp:    SpO2: 100% 98%    Last Pain:  Vitals:   11/26/16 0953  TempSrc: Tympanic                 [name]

## 2016-11-28 LAB — SURGICAL PATHOLOGY

## 2016-12-18 ENCOUNTER — Encounter (HOSPITAL_COMMUNITY): Payer: Self-pay | Admitting: Emergency Medicine

## 2016-12-18 ENCOUNTER — Inpatient Hospital Stay (HOSPITAL_COMMUNITY)
Admission: EM | Admit: 2016-12-18 | Discharge: 2016-12-20 | DRG: 042 | Disposition: A | Discharge status: Home Health | Payer: BLUE CROSS/BLUE SHIELD | Attending: Internal Medicine | Admitting: Internal Medicine

## 2016-12-18 DIAGNOSIS — E669 Obesity, unspecified: Secondary | ICD-10-CM | POA: Diagnosis present

## 2016-12-18 DIAGNOSIS — Z881 Allergy status to other antibiotic agents status: Secondary | ICD-10-CM | POA: Diagnosis not present

## 2016-12-18 DIAGNOSIS — R825 Elevated urine levels of drugs, medicaments and biological substances: Secondary | ICD-10-CM | POA: Diagnosis present

## 2016-12-18 DIAGNOSIS — Z91010 Allergy to peanuts: Secondary | ICD-10-CM

## 2016-12-18 DIAGNOSIS — F329 Major depressive disorder, single episode, unspecified: Secondary | ICD-10-CM | POA: Diagnosis present

## 2016-12-18 DIAGNOSIS — I6389 Other cerebral infarction: Secondary | ICD-10-CM

## 2016-12-18 DIAGNOSIS — D500 Iron deficiency anemia secondary to blood loss (chronic): Secondary | ICD-10-CM | POA: Diagnosis not present

## 2016-12-18 DIAGNOSIS — R29700 NIHSS score 0: Secondary | ICD-10-CM | POA: Diagnosis not present

## 2016-12-18 DIAGNOSIS — E119 Type 2 diabetes mellitus without complications: Secondary | ICD-10-CM | POA: Diagnosis not present

## 2016-12-18 DIAGNOSIS — I10 Essential (primary) hypertension: Secondary | ICD-10-CM | POA: Diagnosis not present

## 2016-12-18 DIAGNOSIS — Z880 Allergy status to penicillin: Secondary | ICD-10-CM | POA: Diagnosis not present

## 2016-12-18 DIAGNOSIS — R42 Dizziness and giddiness: Secondary | ICD-10-CM | POA: Diagnosis not present

## 2016-12-18 DIAGNOSIS — I081 Rheumatic disorders of both mitral and tricuspid valves: Secondary | ICD-10-CM | POA: Diagnosis not present

## 2016-12-18 DIAGNOSIS — H8149 Vertigo of central origin, unspecified ear: Secondary | ICD-10-CM | POA: Diagnosis not present

## 2016-12-18 DIAGNOSIS — I63442 Cerebral infarction due to embolism of left cerebellar artery: Principal | ICD-10-CM | POA: Diagnosis present

## 2016-12-18 DIAGNOSIS — I313 Pericardial effusion (noninflammatory): Secondary | ICD-10-CM | POA: Diagnosis not present

## 2016-12-18 DIAGNOSIS — R404 Transient alteration of awareness: Secondary | ICD-10-CM | POA: Diagnosis not present

## 2016-12-18 DIAGNOSIS — K219 Gastro-esophageal reflux disease without esophagitis: Secondary | ICD-10-CM | POA: Diagnosis present

## 2016-12-18 DIAGNOSIS — R270 Ataxia, unspecified: Secondary | ICD-10-CM | POA: Diagnosis not present

## 2016-12-18 DIAGNOSIS — Z6831 Body mass index (BMI) 31.0-31.9, adult: Secondary | ICD-10-CM | POA: Diagnosis not present

## 2016-12-18 DIAGNOSIS — R109 Unspecified abdominal pain: Secondary | ICD-10-CM | POA: Diagnosis not present

## 2016-12-18 DIAGNOSIS — I639 Cerebral infarction, unspecified: Secondary | ICD-10-CM | POA: Diagnosis present

## 2016-12-18 DIAGNOSIS — I63532 Cerebral infarction due to unspecified occlusion or stenosis of left posterior cerebral artery: Secondary | ICD-10-CM | POA: Diagnosis not present

## 2016-12-18 DIAGNOSIS — I6350 Cerebral infarction due to unspecified occlusion or stenosis of unspecified cerebral artery: Secondary | ICD-10-CM | POA: Diagnosis not present

## 2016-12-18 DIAGNOSIS — K589 Irritable bowel syndrome without diarrhea: Secondary | ICD-10-CM | POA: Diagnosis present

## 2016-12-18 DIAGNOSIS — F419 Anxiety disorder, unspecified: Secondary | ICD-10-CM | POA: Diagnosis not present

## 2016-12-18 DIAGNOSIS — I6521 Occlusion and stenosis of right carotid artery: Secondary | ICD-10-CM | POA: Diagnosis not present

## 2016-12-18 DIAGNOSIS — R040 Epistaxis: Secondary | ICD-10-CM | POA: Diagnosis present

## 2016-12-18 DIAGNOSIS — E785 Hyperlipidemia, unspecified: Secondary | ICD-10-CM | POA: Diagnosis present

## 2016-12-18 DIAGNOSIS — I340 Nonrheumatic mitral (valve) insufficiency: Secondary | ICD-10-CM | POA: Diagnosis not present

## 2016-12-18 DIAGNOSIS — I6300 Cerebral infarction due to thrombosis of unspecified precerebral artery: Secondary | ICD-10-CM | POA: Diagnosis not present

## 2016-12-18 DIAGNOSIS — I63432 Cerebral infarction due to embolism of left posterior cerebral artery: Secondary | ICD-10-CM | POA: Diagnosis not present

## 2016-12-18 DIAGNOSIS — G2581 Restless legs syndrome: Secondary | ICD-10-CM | POA: Diagnosis present

## 2016-12-18 DIAGNOSIS — R51 Headache: Secondary | ICD-10-CM | POA: Diagnosis not present

## 2016-12-18 DIAGNOSIS — D649 Anemia, unspecified: Secondary | ICD-10-CM | POA: Diagnosis present

## 2016-12-18 HISTORY — DX: Cerebral infarction, unspecified: I63.9

## 2016-12-18 HISTORY — DX: Conversion disorder with seizures or convulsions: F44.5

## 2016-12-18 NOTE — ED Triage Notes (Signed)
Spouse reports 2 minutes of LOC. Refusing to sit back for labs, EKG in triage

## 2016-12-18 NOTE — ED Triage Notes (Signed)
REFUSING EKG IN TRIAGE

## 2016-12-18 NOTE — ED Triage Notes (Signed)
Pt reports episode of GERD tonight, states abdominal pain and nausea. Reports nose bleed and episode of anxiety resulting in hyperventilation and dizziness. 20 G IV R hand, 4MG  zofran given by EMS for nausea. Given 400 ML NS bolus with EMS.

## 2016-12-19 ENCOUNTER — Inpatient Hospital Stay (HOSPITAL_COMMUNITY): Payer: BLUE CROSS/BLUE SHIELD

## 2016-12-19 ENCOUNTER — Emergency Department (HOSPITAL_COMMUNITY): Payer: BLUE CROSS/BLUE SHIELD

## 2016-12-19 ENCOUNTER — Encounter (HOSPITAL_COMMUNITY): Payer: Self-pay | Admitting: General Practice

## 2016-12-19 DIAGNOSIS — K589 Irritable bowel syndrome without diarrhea: Secondary | ICD-10-CM | POA: Diagnosis present

## 2016-12-19 DIAGNOSIS — F419 Anxiety disorder, unspecified: Secondary | ICD-10-CM | POA: Diagnosis present

## 2016-12-19 DIAGNOSIS — Z6831 Body mass index (BMI) 31.0-31.9, adult: Secondary | ICD-10-CM | POA: Diagnosis not present

## 2016-12-19 DIAGNOSIS — I63432 Cerebral infarction due to embolism of left posterior cerebral artery: Secondary | ICD-10-CM | POA: Diagnosis present

## 2016-12-19 DIAGNOSIS — Z91010 Allergy to peanuts: Secondary | ICD-10-CM | POA: Diagnosis not present

## 2016-12-19 DIAGNOSIS — R29700 NIHSS score 0: Secondary | ICD-10-CM | POA: Diagnosis present

## 2016-12-19 DIAGNOSIS — I10 Essential (primary) hypertension: Secondary | ICD-10-CM | POA: Diagnosis present

## 2016-12-19 DIAGNOSIS — I6389 Other cerebral infarction: Secondary | ICD-10-CM | POA: Diagnosis not present

## 2016-12-19 DIAGNOSIS — I6521 Occlusion and stenosis of right carotid artery: Secondary | ICD-10-CM | POA: Diagnosis not present

## 2016-12-19 DIAGNOSIS — R825 Elevated urine levels of drugs, medicaments and biological substances: Secondary | ICD-10-CM | POA: Diagnosis present

## 2016-12-19 DIAGNOSIS — I639 Cerebral infarction, unspecified: Secondary | ICD-10-CM | POA: Diagnosis not present

## 2016-12-19 DIAGNOSIS — I63442 Cerebral infarction due to embolism of left cerebellar artery: Secondary | ICD-10-CM | POA: Diagnosis present

## 2016-12-19 DIAGNOSIS — E785 Hyperlipidemia, unspecified: Secondary | ICD-10-CM | POA: Diagnosis present

## 2016-12-19 DIAGNOSIS — G2581 Restless legs syndrome: Secondary | ICD-10-CM | POA: Diagnosis present

## 2016-12-19 DIAGNOSIS — R040 Epistaxis: Secondary | ICD-10-CM | POA: Diagnosis present

## 2016-12-19 DIAGNOSIS — R42 Dizziness and giddiness: Secondary | ICD-10-CM | POA: Diagnosis not present

## 2016-12-19 DIAGNOSIS — I313 Pericardial effusion (noninflammatory): Secondary | ICD-10-CM | POA: Diagnosis not present

## 2016-12-19 DIAGNOSIS — R109 Unspecified abdominal pain: Secondary | ICD-10-CM | POA: Diagnosis not present

## 2016-12-19 DIAGNOSIS — I081 Rheumatic disorders of both mitral and tricuspid valves: Secondary | ICD-10-CM | POA: Diagnosis not present

## 2016-12-19 DIAGNOSIS — K219 Gastro-esophageal reflux disease without esophagitis: Secondary | ICD-10-CM | POA: Diagnosis present

## 2016-12-19 DIAGNOSIS — I340 Nonrheumatic mitral (valve) insufficiency: Secondary | ICD-10-CM | POA: Diagnosis not present

## 2016-12-19 DIAGNOSIS — D649 Anemia, unspecified: Secondary | ICD-10-CM | POA: Diagnosis present

## 2016-12-19 DIAGNOSIS — Z881 Allergy status to other antibiotic agents status: Secondary | ICD-10-CM | POA: Diagnosis not present

## 2016-12-19 DIAGNOSIS — E669 Obesity, unspecified: Secondary | ICD-10-CM | POA: Diagnosis present

## 2016-12-19 DIAGNOSIS — I63532 Cerebral infarction due to unspecified occlusion or stenosis of left posterior cerebral artery: Secondary | ICD-10-CM | POA: Diagnosis not present

## 2016-12-19 DIAGNOSIS — D500 Iron deficiency anemia secondary to blood loss (chronic): Secondary | ICD-10-CM | POA: Diagnosis not present

## 2016-12-19 DIAGNOSIS — F329 Major depressive disorder, single episode, unspecified: Secondary | ICD-10-CM | POA: Diagnosis present

## 2016-12-19 DIAGNOSIS — E119 Type 2 diabetes mellitus without complications: Secondary | ICD-10-CM | POA: Diagnosis present

## 2016-12-19 DIAGNOSIS — I6300 Cerebral infarction due to thrombosis of unspecified precerebral artery: Secondary | ICD-10-CM | POA: Diagnosis not present

## 2016-12-19 DIAGNOSIS — R51 Headache: Secondary | ICD-10-CM | POA: Diagnosis not present

## 2016-12-19 DIAGNOSIS — Z880 Allergy status to penicillin: Secondary | ICD-10-CM | POA: Diagnosis not present

## 2016-12-19 DIAGNOSIS — R270 Ataxia, unspecified: Secondary | ICD-10-CM | POA: Diagnosis not present

## 2016-12-19 LAB — COMPREHENSIVE METABOLIC PANEL
ALT: 16 U/L (ref 14–54)
AST: 19 U/L (ref 15–41)
Albumin: 4 g/dL (ref 3.5–5.0)
Alkaline Phosphatase: 67 U/L (ref 38–126)
Anion gap: 10 (ref 5–15)
BUN: 13 mg/dL (ref 6–20)
CHLORIDE: 103 mmol/L (ref 101–111)
CO2: 22 mmol/L (ref 22–32)
Calcium: 8.9 mg/dL (ref 8.9–10.3)
Creatinine, Ser: 1.03 mg/dL — ABNORMAL HIGH (ref 0.44–1.00)
GFR calc Af Amer: >60 mL/min (ref >60)
GLUCOSE: 126 mg/dL — AB (ref 65–99)
Potassium: 3.8 mmol/L (ref 3.5–5.1)
SODIUM: 135 mmol/L (ref 135–145)
Total Bilirubin: 0.9 mg/dL (ref 0.3–1.2)
Total Protein: 6.6 g/dL (ref 6.5–8.1)

## 2016-12-19 LAB — LIPID PANEL
CHOL/HDL RATIO: 6.2 ratio
CHOLESTEROL: 230 mg/dL — AB (ref 0–200)
HDL: 37 mg/dL — AB (ref >40)
LDL Cholesterol: 117 mg/dL — ABNORMAL HIGH (ref 0–99)
TRIGLYCERIDES: 378 mg/dL — AB (ref <150)
VLDL: 76 mg/dL — AB (ref 0–40)

## 2016-12-19 LAB — CBC
HCT: 39.6 % (ref 36.0–46.0)
Hemoglobin: 13.7 g/dL (ref 12.0–15.0)
MCH: 30.2 pg (ref 26.0–34.0)
MCHC: 34.6 g/dL (ref 30.0–36.0)
MCV: 87.4 fL (ref 78.0–100.0)
PLATELETS: 271 10*3/uL (ref 150–400)
RBC: 4.53 MIL/uL (ref 3.87–5.11)
RDW: 12.9 % (ref 11.5–15.5)
WBC: 14.8 10*3/uL — ABNORMAL HIGH (ref 4.0–10.5)

## 2016-12-19 LAB — RAPID URINE DRUG SCREEN, HOSP PERFORMED
Amphetamines: NOT DETECTED
BARBITURATES: NOT DETECTED
BENZODIAZEPINES: POSITIVE — AB
COCAINE: NOT DETECTED
Opiates: POSITIVE — AB
TETRAHYDROCANNABINOL: NOT DETECTED

## 2016-12-19 LAB — URINALYSIS, ROUTINE W REFLEX MICROSCOPIC
Bilirubin Urine: NEGATIVE
GLUCOSE, UA: NEGATIVE mg/dL
HGB URINE DIPSTICK: NEGATIVE
KETONES UR: NEGATIVE mg/dL
Leukocytes, UA: NEGATIVE
Nitrite: NEGATIVE
PROTEIN: NEGATIVE mg/dL
Specific Gravity, Urine: 1.011 (ref 1.005–1.030)
pH: 7 (ref 5.0–8.0)

## 2016-12-19 LAB — I-STAT BETA HCG BLOOD, ED (MC, WL, AP ONLY)

## 2016-12-19 LAB — ECHOCARDIOGRAM COMPLETE
Height: 66 in
Weight: 3136 oz

## 2016-12-19 LAB — LIPASE, BLOOD: LIPASE: 26 U/L (ref 11–51)

## 2016-12-19 LAB — I-STAT TROPONIN, ED: Troponin i, poc: 0 ng/mL (ref 0.00–0.08)

## 2016-12-19 LAB — HEMOGLOBIN A1C
Hgb A1c MFr Bld: 5.5 % (ref 4.8–5.6)
Mean Plasma Glucose: 111.15 mg/dL

## 2016-12-19 LAB — PROTIME-INR
INR: 1.02
PROTHROMBIN TIME: 13.3 s (ref 11.4–15.2)

## 2016-12-19 LAB — SEDIMENTATION RATE: SED RATE: 7 mm/h (ref 0–22)

## 2016-12-19 MED ORDER — SODIUM CHLORIDE 0.9 % IV SOLN
INTRAVENOUS | Status: DC
  Administered 2016-12-19: 02:00:00 via INTRAVENOUS

## 2016-12-19 MED ORDER — ASPIRIN 325 MG PO TABS
325.0000 mg | ORAL_TABLET | Freq: Every day | ORAL | Status: DC
  Filled 2016-12-19: qty 1

## 2016-12-19 MED ORDER — MECLIZINE HCL 25 MG PO TABS
50.0000 mg | ORAL_TABLET | Freq: Once | ORAL | Status: AC
  Administered 2016-12-19: 50 mg via ORAL
  Filled 2016-12-19: qty 2

## 2016-12-19 MED ORDER — KETOROLAC TROMETHAMINE 30 MG/ML IJ SOLN
30.0000 mg | Freq: Once | INTRAMUSCULAR | Status: AC
  Administered 2016-12-19: 30 mg via INTRAVENOUS
  Filled 2016-12-19: qty 1

## 2016-12-19 MED ORDER — ACETAMINOPHEN 325 MG PO TABS
650.0000 mg | ORAL_TABLET | Freq: Three times a day (TID) | ORAL | Status: DC
  Administered 2016-12-19 – 2016-12-20 (×2): 650 mg via ORAL
  Filled 2016-12-19 (×2): qty 2

## 2016-12-19 MED ORDER — STROKE: EARLY STAGES OF RECOVERY BOOK
Freq: Once | Status: DC
  Filled 2016-12-19: qty 1

## 2016-12-19 MED ORDER — MORPHINE SULFATE (PF) 4 MG/ML IV SOLN
1.0000 mg | INTRAVENOUS | Status: DC | PRN
  Administered 2016-12-19 – 2016-12-20 (×5): 2 mg via INTRAVENOUS
  Filled 2016-12-19 (×4): qty 1

## 2016-12-19 MED ORDER — ASPIRIN 300 MG RE SUPP: 300.0000 mg | Freq: Every day | RECTAL | Status: DC

## 2016-12-19 MED ORDER — ALPRAZOLAM ER 1 MG PO TB24: 1.0000 mg | ORAL_TABLET | Freq: Every day | ORAL | Status: DC

## 2016-12-19 MED ORDER — CYCLOBENZAPRINE HCL 10 MG PO TABS: 10.0000 mg | ORAL_TABLET | Freq: Every day | ORAL | Status: DC

## 2016-12-19 MED ORDER — ONDANSETRON HCL 4 MG/2ML IJ SOLN
4.0000 mg | Freq: Once | INTRAMUSCULAR | Status: AC
  Administered 2016-12-19: 4 mg via INTRAVENOUS
  Filled 2016-12-19: qty 2

## 2016-12-19 MED ORDER — ICOSAPENT ETHYL 1 G PO CAPS: 2.0000 g | ORAL_CAPSULE | Freq: Two times a day (BID) | ORAL | Status: DC

## 2016-12-19 MED ORDER — METOCLOPRAMIDE HCL 5 MG/ML IJ SOLN
10.0000 mg | Freq: Once | INTRAMUSCULAR | Status: AC
  Administered 2016-12-19: 10 mg via INTRAVENOUS
  Filled 2016-12-19: qty 2

## 2016-12-19 MED ORDER — ACETAMINOPHEN 160 MG/5ML PO SOLN: 650.0000 mg | ORAL | Status: DC | PRN

## 2016-12-19 MED ORDER — ACETAMINOPHEN 325 MG PO TABS
650.0000 mg | ORAL_TABLET | ORAL | Status: DC | PRN
  Administered 2016-12-19: 650 mg via ORAL
  Filled 2016-12-19: qty 2

## 2016-12-19 MED ORDER — KETOROLAC TROMETHAMINE 15 MG/ML IJ SOLN
15.0000 mg | Freq: Once | INTRAMUSCULAR | Status: AC
  Administered 2016-12-19: 15 mg via INTRAVENOUS
  Filled 2016-12-19: qty 1

## 2016-12-19 MED ORDER — GI COCKTAIL ~~LOC~~
30.0000 mL | Freq: Once | ORAL | Status: AC
  Administered 2016-12-19: 30 mL via ORAL
  Filled 2016-12-19: qty 30

## 2016-12-19 MED ORDER — ATORVASTATIN CALCIUM 80 MG PO TABS
80.0000 mg | ORAL_TABLET | Freq: Every day | ORAL | Status: DC
  Administered 2016-12-20: 80 mg via ORAL
  Filled 2016-12-19: qty 1
  Filled 2016-12-19: qty 2

## 2016-12-19 MED ORDER — MORPHINE SULFATE (PF) 4 MG/ML IV SOLN
INTRAVENOUS | Status: AC
  Filled 2016-12-19: qty 1

## 2016-12-19 MED ORDER — PAROXETINE HCL 20 MG PO TABS: 40.0000 mg | ORAL_TABLET | Freq: Every day | ORAL | Status: DC

## 2016-12-19 MED ORDER — ASPIRIN EC 325 MG PO TBEC
325.0000 mg | DELAYED_RELEASE_TABLET | Freq: Every day | ORAL | Status: DC
  Administered 2016-12-19 – 2016-12-20 (×2): 325 mg via ORAL
  Filled 2016-12-19 (×2): qty 1

## 2016-12-19 MED ORDER — PANTOPRAZOLE SODIUM 40 MG PO TBEC
40.0000 mg | DELAYED_RELEASE_TABLET | Freq: Every day | ORAL | Status: DC
  Administered 2016-12-19: 40 mg via ORAL
  Filled 2016-12-19: qty 1

## 2016-12-19 MED ORDER — SODIUM CHLORIDE 0.9 % IV BOLUS (SEPSIS)
1000.0000 mL | Freq: Once | INTRAVENOUS | Status: AC
  Administered 2016-12-19: 1000 mL via INTRAVENOUS

## 2016-12-19 MED ORDER — ONDANSETRON HCL 4 MG/2ML IJ SOLN
4.0000 mg | Freq: Four times a day (QID) | INTRAMUSCULAR | Status: DC | PRN
  Administered 2016-12-19 (×2): 4 mg via INTRAVENOUS
  Filled 2016-12-19 (×2): qty 2

## 2016-12-19 MED ORDER — DIPHENHYDRAMINE HCL 50 MG/ML IJ SOLN
25.0000 mg | Freq: Once | INTRAMUSCULAR | Status: AC
  Administered 2016-12-19: 25 mg via INTRAVENOUS
  Filled 2016-12-19: qty 1

## 2016-12-19 MED ORDER — SODIUM CHLORIDE 0.9 % IV SOLN
INTRAVENOUS | Status: DC
  Administered 2016-12-19: 18:00:00 via INTRAVENOUS

## 2016-12-19 MED ORDER — ZOLPIDEM TARTRATE 5 MG PO TABS: 5.0000 mg | ORAL_TABLET | Freq: Every evening | ORAL | Status: DC | PRN

## 2016-12-19 MED ORDER — PANTOPRAZOLE SODIUM 40 MG IV SOLR
40.0000 mg | Freq: Two times a day (BID) | INTRAVENOUS | Status: DC
  Administered 2016-12-20: 40 mg via INTRAVENOUS
  Filled 2016-12-19: qty 40

## 2016-12-19 MED ORDER — PANTOPRAZOLE SODIUM 40 MG IV SOLR
40.0000 mg | Freq: Once | INTRAVENOUS | Status: AC
  Administered 2016-12-19: 40 mg via INTRAVENOUS
  Filled 2016-12-19: qty 40

## 2016-12-19 MED ORDER — ACETAMINOPHEN 650 MG RE SUPP: 650.0000 mg | RECTAL | Status: DC | PRN

## 2016-12-19 MED ORDER — SODIUM CHLORIDE 0.9 % IV SOLN
INTRAVENOUS | Status: AC
  Administered 2016-12-19: 13:00:00 via INTRAVENOUS

## 2016-12-19 MED ORDER — ENOXAPARIN SODIUM 40 MG/0.4ML ~~LOC~~ SOLN: 40.0000 mg | SUBCUTANEOUS | Status: DC

## 2016-12-19 MED ORDER — IOPAMIDOL (ISOVUE-370) INJECTION 76%
INTRAVENOUS | Status: AC
  Filled 2016-12-19: qty 50

## 2016-12-19 MED ORDER — LORAZEPAM 2 MG/ML IJ SOLN
1.0000 mg | Freq: Once | INTRAMUSCULAR | Status: AC
  Administered 2016-12-19: 1 mg via INTRAVENOUS
  Filled 2016-12-19: qty 1

## 2016-12-19 MED ORDER — POLYETHYLENE GLYCOL 3350 17 G PO PACK: 17.0000 g | PACK | Freq: Every day | ORAL | Status: DC

## 2016-12-19 MED ORDER — IOPAMIDOL (ISOVUE-370) INJECTION 76%
50.0000 mL | Freq: Once | INTRAVENOUS | Status: AC | PRN
  Administered 2016-12-19: 50 mL via INTRAVENOUS

## 2016-12-19 NOTE — H&P (Signed)
History and Physical    Alicia [name] Alicia [name] ZOX:[id number] DOB: 1970-09-01 DOA: 12/18/2016  PCP: Alba Cory, MD  Patient coming from: Home  I have personally briefly reviewed patient's old medical records in [facility]  Chief Complaint: Dizziness  HPI: Alicia [name] is a 46 y.o. female with medical history significant of anxiety, depression, psychogenic non-epileptic seizures.  Patient presents to the ED with c/o dizziness.  Symptoms onset between 9 and 10pm.  Sitting watching TV when symptoms onset.  "everything kept moving".   ED Course: MRI obtained which shows small cerebellar infarct.   Review of Systems: As per HPI otherwise 10 point review of systems negative.   Past Medical History:  Diagnosis Date  . Anemia   . Anxiety   . Depression   . GERD (gastroesophageal reflux disease)   . Hyperlipidemia   . IBS (irritable bowel syndrome)   . Restless leg syndrome     Past Surgical History:  Procedure Laterality Date  . ABDOMINOPLASTY  Feb. 2015  . BREAST SURGERY Bilateral December 2015   Breast Reduction  . CESAREAN SECTION  2011  . COLONOSCOPY WITH PROPOFOL N/A 11/26/2016   Procedure: COLONOSCOPY WITH PROPOFOL;  Surgeon: Wyline Mood, MD;  Location: [facility] ENDOSCOPY;  Service: Gastroenterology;  Laterality: N/A;  . CYSTOSCOPY  02/02/2015   Procedure: CYSTOSCOPY;  Surgeon: Nadara Mustard, MD;  Location: [facility] ORS;  Service: Gynecology;;  . Joya Gaskins AND CURETTAGE OF UTERUS  2003, 2005, 2008  . ESOPHAGOGASTRODUODENOSCOPY (EGD) WITH PROPOFOL N/A 11/26/2016   Procedure: ESOPHAGOGASTRODUODENOSCOPY (EGD) WITH PROPOFOL;  Surgeon: Wyline Mood, MD;  Location: [facility] ENDOSCOPY;  Service: Gastroenterology;  Laterality: N/A;  . HERNIA REPAIR  1999   Umbilical  . KNEE SURGERY Right 2006   Knee Arthroscopy  . LAPAROSCOPIC BILATERAL SALPINGECTOMY Bilateral 02/02/2015   Procedure: LAPAROSCOPIC BILATERAL SALPINGECTOMY;  Surgeon: Nadara Mustard, MD;  Location: [facility]  ORS;  Service: Gynecology;  Laterality: Bilateral;  . LAPAROSCOPIC HYSTERECTOMY N/A 02/02/2015   Procedure: HYSTERECTOMY TOTAL LAPAROSCOPIC;  Surgeon: Nadara Mustard, MD;  Location: [facility] ORS;  Service: Gynecology;  Laterality: N/A;     reports that she has never smoked. She has never used smokeless tobacco. She reports that she does not drink alcohol or use drugs.  Allergies  Allergen Reactions  . Azithromycin Diarrhea  . Peanuts [Peanut Oil] Swelling    "swelling of lips"   . Penicillins Hives and Swelling    Has patient had a PCN reaction causing immediate rash, facial/tongue/throat swelling, SOB or lightheadedness with hypotension: Yes Has patient had a PCN reaction causing severe rash involving mucus membranes or skin necrosis: No Has patient had a PCN reaction that required hospitalization No Has patient had a PCN reaction occurring within the last 10 years: No If all of the above answers are "NO", then may proceed with Cephalosporin use.    Family History  Problem Relation Age of Onset  . Hypertension Mother   . Hyperlipidemia Mother   . Heart Problems Father        hole in heart  . Prostate cancer Maternal Grandfather      Prior to Admission medications   Medication Sig Start Date End Date Taking? Authorizing Provider  ALPRAZOLAM XR 1 MG 24 hr tablet Take 1 tablet (1 mg total) by mouth daily. Fill June 6th and monthly thereafter 10/10/16   Alba Cory, MD  carvedilol (COREG) 3.125 MG tablet Take 1 tablet (3.125 mg total) by mouth 2 (two) times daily with  a meal. 10/10/16   Sowles, Danna Hefty, MD  cyclobenzaprine (FLEXERIL) 10 MG tablet Take 1 tablet (10 mg total) by mouth daily. 10/10/16   Alba Cory, MD  Icosapent Ethyl (VASCEPA) 1 g CAPS Take 2 g by mouth 2 (two) times daily. 10/10/16   Alba Cory, MD  Multiple Vitamins-Minerals (CENTRUM PO) Take 1 tablet by mouth daily. Reported on 02/02/2015    [provider]  pantoprazole (PROTONIX) 40 MG tablet Take  1 tablet (40 mg total) by mouth daily. 07/18/16   Doren Custard, FNP  PARoxetine (PAXIL) 40 MG tablet Take 1 tablet (40 mg total) by mouth daily. 10/10/16   Alba Cory, MD  polyethylene glycol powder (GLYCOLAX/MIRALAX) powder 17 grams daily 11/13/16   Wyline Mood, MD  zolpidem Remus Loffler) 10 MG tablet take 1 tablet by mouth at bedtime if needed 10/10/16   Alba Cory, MD    Physical Exam: Vitals:   12/19/16 0430 12/19/16 0445 12/19/16 0500 12/19/16 0515  BP: (!) 136/117 128/84 120/77 116/73  Pulse: (!) 109 99 (!) 104 (!) 103  Resp: 14 (!) 21 (!) 21 (!) 26  Temp:      TempSrc:      SpO2: 98% 93% 94% 93%  Weight:      Height:        Constitutional: NAD, calm, comfortable Eyes: PERRL, lids and conjunctivae normal ENMT: Mucous membranes are moist. Posterior pharynx clear of any exudate or lesions.Normal dentition.  Neck: normal, supple, no masses, no thyromegaly Respiratory: clear to auscultation bilaterally, no wheezing, no crackles. Normal respiratory effort. No accessory muscle use.  Cardiovascular: Regular rate and rhythm, no murmurs / rubs / gallops. No extremity edema. 2+ pedal pulses. No carotid bruits.  Abdomen: no tenderness, no masses palpated. No hepatosplenomegaly. Bowel sounds positive.  Musculoskeletal: no clubbing / cyanosis. No joint deformity upper and lower extremities. Good ROM, no contractures. Normal muscle tone.  Skin: no rashes, lesions, ulcers. No induration Neurologic: Inconsistent neurologic exam. Psychiatric: Normal judgment and insight. Alert and oriented x 3. Normal mood.    Labs on Admission: I have personally reviewed following labs and imaging studies  CBC:  Recent Labs Lab 12/19/16 0009  WBC 14.8*  HGB 13.7  HCT 39.6  MCV 87.4  PLT 271   Basic Metabolic Panel:  Recent Labs Lab 12/19/16 0009  NA 135  K 3.8  CL 103  CO2 22  GLUCOSE 126*  BUN 13  CREATININE 1.03*  CALCIUM 8.9   GFR: Estimated Creatinine Clearance: 79 mL/min (A)  (by C-G formula based on SCr of 1.03 mg/dL (H)). Liver Function Tests:  Recent Labs Lab 12/19/16 0009  AST 19  ALT 16  ALKPHOS 67  BILITOT 0.9  PROT 6.6  ALBUMIN 4.0    Recent Labs Lab 12/19/16 0009  LIPASE 26   No results for input(s): AMMONIA in the last 168 hours. Coagulation Profile: No results for input(s): INR, PROTIME in the last 168 hours. Cardiac Enzymes: No results for input(s): CKTOTAL, CKMB, CKMBINDEX, TROPONINI in the last 168 hours. BNP (last 3 results) No results for input(s): PROBNP in the last 8760 hours. HbA1C: No results for input(s): HGBA1C in the last 72 hours. CBG: No results for input(s): GLUCAP in the last 168 hours. Lipid Profile: No results for input(s): CHOL, HDL, LDLCALC, TRIG, CHOLHDL, LDLDIRECT in the last 72 hours. Thyroid Function Tests: No results for input(s): TSH, T4TOTAL, FREET4, T3FREE, THYROIDAB in the last 72 hours. Anemia Panel: No results for input(s): VITAMINB12, FOLATE, FERRITIN,  TIBC, IRON, RETICCTPCT in the last 72 hours. Urine analysis:    Component Value Date/Time   COLORURINE STRAW (A) 12/19/2016 0310   APPEARANCEUR CLEAR 12/19/2016 0310   LABSPEC 1.011 12/19/2016 0310   PHURINE 7.0 12/19/2016 0310   GLUCOSEU NEGATIVE 12/19/2016 0310   HGBUR NEGATIVE 12/19/2016 0310   BILIRUBINUR NEGATIVE 12/19/2016 0310   KETONESUR NEGATIVE 12/19/2016 0310   PROTEINUR NEGATIVE 12/19/2016 0310   NITRITE NEGATIVE 12/19/2016 0310   LEUKOCYTESUR NEGATIVE 12/19/2016 0310    Radiological Exams on Admission: Mr Brain Wo Contrast  Result Date: 12/19/2016 CLINICAL DATA:  Ataxia EXAM: MRI HEAD WITHOUT CONTRAST TECHNIQUE: Multiplanar, multiecho pulse sequences of the brain and surrounding structures were obtained without intravenous contrast. COMPARISON:  Brain MRI 01/20/2016 FINDINGS: Brain: The midline structures are normal. There is diffusion restriction within the medial left cerebellum and is in the left occipital lobe. No mass lesion,  hydrocephalus, dural abnormality or extra-axial collection. There is hyperintense T2-weighted signal at the sites of ischemia but otherwise the parenchymal signal of the brain is normal. No age-advanced or lobar predominant atrophy. No chronic microhemorrhage or superficial siderosis. Vascular: Major intracranial arterial and venous sinus flow voids are preserved. Skull and upper cervical spine: The visualized skull base, calvarium, upper cervical spine and extracranial soft tissues are normal. Sinuses/Orbits: No fluid levels or advanced mucosal thickening. No mastoid or middle ear effusion. Normal orbits. IMPRESSION: 1. Multifocal acute ischemia within the left posterior circulation, including the medial left cerebellum and left occipital lobe. No contralateral for anterior circulation ischemia. 2. No hemorrhage or mass effect. Electronically Signed   By: Deatra RobinsonKevin  Herman M.D.   On: 12/19/2016 03:18   Dg Abd Acute W/chest  Result Date: 12/19/2016 CLINICAL DATA:  46 year old female with abdominal pain. Recent endoscopy. EXAM: DG ABDOMEN ACUTE W/ 1V CHEST COMPARISON:  Abdominal ultrasound dated 11/13/2012 FINDINGS: The lungs are clear. Minimal bibasilar atelectatic changes/ scarring noted. There is no focal consolidation, pleural effusion, or pneumothorax. The cardiac silhouette is within normal limits. There is moderate stool throughout the colon. No bowel dilatation or evidence of obstruction. No free air or radiopaque calculi. The osseous structures and soft tissues are grossly unremarkable. IMPRESSION: 1. No acute cardiopulmonary process. 2. No bowel obstruction.  No free air. Electronically Signed   By: Elgie CollardArash  Radparvar M.D.   On: 12/19/2016 01:12    EKG: Independently reviewed.  Assessment/Plan Active Problems:   Acute arterial ischemic stroke, multifocal, posterior circulation (HCC)    1. Cerebellar stroke - 1. Likely embolic per neurology 2. 2d echo 3. CTA head and neck 4. Tele monitor 5. ASA  325 6. A1C, FLP 7. PT/OT/SLP 2. Unexplained Opiates in urine - no opiates given in ED, I dont see any on her home med rec either  DVT prophylaxis: Lovenox Code Status: Full Family Communication: Husband at bedside Disposition Plan: Home after admit Consults called: Neuro Admission status: Admit to inpatient   Hillary BowGARDNER, Pete Schnitzer M. DO [facility] Pager [phone]  If 7AM-7PM, please contact day team taking care of patient [email or web address] Password [email or web address]  12/19/2016, 5:18 AM

## 2016-12-19 NOTE — ED Notes (Signed)
Patient transported to CT scan . 

## 2016-12-19 NOTE — ED Notes (Signed)
Patient transported to MRI 

## 2016-12-19 NOTE — ED Notes (Signed)
Patient refused Tylenol suppository for her headache .

## 2016-12-19 NOTE — Progress Notes (Signed)
MD (Shorr) page notified regarding pt continuing to have 10/10 headache and nausea. Pt has received 3 doses of prn morphine and zofran together. Pt is refusing to eat; Rn informed pt & family regarding eating ; since pain medicine can increase nausea. Pt has not yet thrown up. Pt family  is stating concern regarding the headaches and would like MD to re-assess patient.

## 2016-12-19 NOTE — Consult Note (Signed)
Neurology Consultation Reason for Consult: Stroke Referring Physician: Ward, K  CC: dizziness  History is obtained from:patient  HPI: Alicia [name] is a 46 y.o. female who presents with dizziness that started sometime between 53 and 10 PM.  She states that she was sitting watching TV, and its of the became hard to see things because "everything kept Moving."  She then came into the emergency department and because of her symptoms an MRI was obtained which shows a small cerebellar infarct.  Of note, she was able to stand up and walk to the bedside commode earlier and felt like she is able to do, she did have some dizziness.  She is not very cooperative with providing history.  LKW: 9pm tpa given?: no, mild symptoms    ROS: A 14 point ROS was performed and is negative except as noted in the HPI.  Past Medical History:  Diagnosis Date  . Anemia   . Anxiety   . Depression   . GERD (gastroesophageal reflux disease)   . Hyperlipidemia   . IBS (irritable bowel syndrome)   . Restless leg syndrome      Family History  Problem Relation Age of Onset  . Hypertension Mother   . Hyperlipidemia Mother   . Heart Problems Father        hole in heart  . Prostate cancer Maternal Grandfather      Social History:  reports that she has never smoked. She has never used smokeless tobacco. She reports that she does not drink alcohol or use drugs.   Exam: Current vital signs: BP 118/81 (BP Location: Left Arm)   Pulse 95   Temp 98.3 F (36.8 C) (Oral)   Resp 18   Ht 5\' 9"  (1.753 m)   Wt 83.9 kg (185 lb)   LMP 01/15/2015 (Exact Date)   SpO2 100%   BMI 27.32 kg/m  Vital signs in last 24 hours: Temp:  [98.3 F (36.8 C)] 98.3 F (36.8 C) (10/31 2317) Pulse Rate:  [95-130] 95 (11/01 0323) Resp:  [18-25] 18 (11/01 0323) BP: (103-149)/(50-118) 118/81 (11/01 0323) SpO2:  [94 %-100 %] 100 % (11/01 0323) Weight:  [83.9 kg (185 lb)] 83.9 kg (185 lb) (10/31 2317)   Physical Exam   Constitutional: Appears well-developed and well-nourished.  Psych: Affect appropriate to situation Eyes: No scleral injection HENT: No OP obstrucion Head: Normocephalic.  Cardiovascular: Normal rate and regular rhythm.  Respiratory: Effort normal and breath sounds normal to anterior ascultation GI: Soft.  No distension. There is no tenderness.  Skin: WDI  Neuro: Mental Status: Patient is awake, alert, oriented to person, place, month, year, and situation. Patient is able to give a clear and coherent history. No signs of aphasia or neglect Cranial Nerves: II: Visual Fields are full. Pupils are equal, round, and reactive to light.   III,IV, VI: EOMI without ptosis.  She has  nystagmus, but keeps her eyes tightly closed which makes assessing this difficult. V: Facial sensation is symmetric to temperature VII: Facial movement is symmetric.  VIII: hearing is intact to voice X: Uvula elevates symmetrically XI: Shoulder shrug is symmetric. XII: tongue is midline without atrophy or fasciculations.  Motor: Tone is normal. Bulk is normal.  She gives poor effort, but has no drift in any extremity. Sensory: Sensation is diminished on the right side. Deep Tendon Reflexes: 2+ and symmetric in the biceps and patellae.  Plantars: Toes are downgoing bilaterally.  Cerebellar: She does not open eyes or cooperate  very well with finger-nose-finger testing, appears relatively symmetric in the arms but she does have difficulty with heel-knee-shin on the left   I have reviewed labs in epic and the results pertinent to this consultation are: CMP-unremarkable  I have reviewed the images obtained: MRI brain - posterior circulation infarcts on the left  Impression: 46 year old female with likely embolic left posterior circulation infarcts.  She will need to be admitted for PT and evaluation for cause of the stroke.  Recommendations: 1. HgbA1c, fasting lipid panel 2. MRI of the brain without  contrast 3. Frequent neuro checks 4. Echocardiogram 5.  CTA head and neck 6. Prophylactic therapy-Antiplatelet med: Aspirin - dose 325mg  PO or 300mg  PR 7. Risk factor modification 8. Telemetry monitoring 9. PT consult, OT consult, Speech consult 10. please page stroke NP  Or  PA  Or MD  from 8am -4 pm as this patient will be followed by the stroke team at this point.   You can look them up on [email or web address]    Ritta SlotMcNeill Sireen Halk, MD [facility] [phone]  If 7pm- 7am, please page neurology on call as listed in AMION.

## 2016-12-19 NOTE — Progress Notes (Signed)
  Echocardiogram 2D Echocardiogram has been performed.  Roselia Snipe L Androw 12/19/2016, 2:31 PM

## 2016-12-19 NOTE — ED Notes (Signed)
Report given to 3W RN

## 2016-12-19 NOTE — Evaluation (Signed)
Physical Therapy Evaluation Patient Details Name: Elta GuadeloupeDorothy Van Note [name] MRN: [id number] DOB: [birth year] Today's Date: 12/19/2016   History of Present Illness  Pt is a 46 y/o female admitted following sudden onset dizziness. Imaging revealed acute infart in L cerebellar and L occipital lobe. PMH includes GERD, depression, anxiety, restless leg syndrome, seizures, R arthroscopic knee surgery.   Clinical Impression  Pt admitted secondary to problem above with deficits below. PTA, pt was independent with functional mobility. Upon eval, pt experiencing dizziness, decreased balance, and decreased coordination. [facility] and required max A for steadying. Pt also demonstrating L lateral lean in sitting, however, able to self correct with cues. Pt's husband present throughout session and was very supportive and reports he can provide 24/7 assist. Recommending CIR at d/c to increase independence and safety with functional mobility as pt was very independent before. Will continue to follow acutely to progress mobility according to pt tolerance.     Follow Up Recommendations CIR;Supervision/Assistance - 24 hour    Equipment Recommendations  Wheelchair (measurements PT);Wheelchair cushion (measurements PT);3in1 (PT)    Recommendations for Other Services       Precautions / Restrictions Precautions Precautions: Fall Restrictions Weight Bearing Restrictions: No      Mobility  Bed Mobility Overal bed mobility: Needs Assistance Bed Mobility: Supine to Sit;Sit to Supine     Supine to sit: Mod assist Sit to supine: Min assist   General bed mobility comments: Mod A for trunk elevation. Heavy lean to L upon sitting and required assist and verbal cues for midline orientation. Pt required extended seated rest secondary to dizziness.   Transfers Overall transfer level: Needs assistance Equipment used: 1 person hand held assist Transfers: Sit to/from [employer]Stand;Stand Pivot  Transfers Sit to Stand: Max assist Stand pivot transfers: Max assist       General transfer comment: Pt very guarded mobility secondary to dizziness. Max A for lift assist and steadying throughout transfer. Husband also [facility].   Ambulation/Gait             General Gait Details: Unable to test secondary to dizziness.   Stairs            Wheelchair Mobility    Modified Rankin (Stroke Patients Only) Modified Rankin (Stroke Patients Only) Pre-Morbid Rankin Score: No symptoms Modified Rankin: Moderately severe disability     Balance Overall balance assessment: Needs assistance Sitting-balance support: No upper extremity supported;Feet supported Sitting balance-Leahy Scale: Poor Sitting balance - Comments: min guard to min A to maintain sitting balance.    Standing balance support: During functional activity;Single extremity supported Standing balance-Leahy Scale: Poor Standing balance comment: Reliant on UE and external assist for balance.                              Pertinent Vitals/Pain Pain Assessment: 0-10 Pain Score: 4  Pain Location: headache  Pain Descriptors / Indicators: Dull Pain Intervention(s): Limited activity within patient's tolerance;Monitored during session;Repositioned    Home Living Family/patient expects to be discharged to:: Private residence Living Arrangements: Spouse/significant other;Children Available Help at Discharge: Family;Available 24 hours/day Type of Home: House Home Access: Stairs to enter Entrance Stairs-Rails: Right Entrance Stairs-Number of Steps: 6 Home Layout: One level Home Equipment: None      Prior Function Level of Independence: Independent               Hand Dominance  Extremity/Trunk Assessment   Upper Extremity Assessment Upper Extremity Assessment: Defer to OT evaluation    Lower Extremity Assessment Lower Extremity Assessment: Generalized weakness  (Unable to fully assess secondary to dizziness. )    Cervical / Trunk Assessment Cervical / Trunk Assessment: Other exceptions Cervical / Trunk Exceptions: Leaning to the L at some points, however, could self correct with cues.   Communication   Communication: No difficulties  Cognition Arousal/Alertness: Lethargic Behavior During Therapy: Anxious Overall Cognitive Status: Difficult to assess                                 General Comments: Pt closing eyes, and very dizzy throughout session. Required extended time to respond to questions, but feel this was because of dizziness.       General Comments General comments (skin integrity, edema, etc.): Pt's husband present in room and assisted with history.     Exercises     Assessment/Plan    PT Assessment Patient needs continued PT services  PT Problem List Decreased balance;Decreased mobility;Decreased activity tolerance;Decreased coordination;Decreased cognition;Decreased knowledge of use of DME;Decreased safety awareness;Decreased knowledge of precautions;Pain       PT Treatment Interventions DME instruction;Gait training;Therapeutic exercise;Therapeutic activities;Functional mobility training;Stair training;Balance training;Neuromuscular re-education;Patient/family education;Cognitive remediation    PT Goals (Current goals can be found in the Care Plan section)  Acute Rehab PT Goals Patient Stated Goal: to get rid of dizziness  PT Goal Formulation: With patient/family Time For Goal Achievement: 01/02/17 Potential to Achieve Goals: Good    Frequency Min 4X/week   Barriers to discharge        Co-evaluation               AM-PAC PT "6 Clicks" Daily Activity  Outcome Measure Difficulty turning over in bed (including adjusting bedclothes, sheets and blankets)?: Unable Difficulty moving from lying on back to sitting on the side of the bed? : Unable Difficulty sitting down on and standing up from a  chair with arms (e.g., wheelchair, bedside commode, etc,.)?: Unable Help needed moving to and from a bed to chair (including a wheelchair)?: A Lot Help needed walking in hospital room?: A Lot Help needed climbing 3-5 steps with a railing? : Total 6 Click Score: 8    End of Session Equipment Utilized During Treatment: Gait belt Activity Tolerance: Treatment limited secondary to medical complications (Comment) (dizziness ) Patient left: in bed;with call bell/phone within reach;with family/visitor present Nurse Communication: Mobility status PT Visit Diagnosis: Unsteadiness on feet (R26.81);Other symptoms and signs involving the nervous system (R29.898)    Time: 1610-9604 PT Time Calculation (min) (ACUTE ONLY): 24 min   Charges:   PT Evaluation $PT Eval Moderate Complexity: 1 Mod PT Treatments $Therapeutic Activity: 8-22 mins   PT G Codes:        Gladys Damme, PT, DPT  Acute Rehabilitation Services  Pager: [phone]   [name] 12/19/2016, 4:58 PM

## 2016-12-19 NOTE — Progress Notes (Signed)
PROGRESS NOTE   Alicia [name] Note Braun  ZOX:[id number]    DOB: January 29, 1971    DOA: 12/18/2016  PCP: Alba Cory, MD   I have briefly reviewed patients previous medical records in [facility]  Brief Narrative:  46 year old married female with PMH of GERD, status post EGD and esophageal dilatation few weeks ago, HTN, HLD, anxiety and depression presented to ED with acute onset of dizziness, vertigo that started between 9-10 PM on day of admission. This was associated with epistaxis, several episodes of vomiting consisting of blood (possibly swallowed blood), headache. MRI brain confirmed left posterior circulation infarcts, likely embolic. Admitted for acute stroke. Neurology consulted.  Assessment & Plan:   Active Problems:   Acute arterial ischemic stroke, multifocal, posterior circulation (HCC)   Acute left posterior circulation CVA (medial left cerebellum and left occipital lobe) - Resultant dizziness, vertigo and headache. - Etiology: Suspect embolic, unclear source. - MRI head: Multifocal acute ischemia within the left posterior circulation including the medial left cerebellum and left occipital lobe. No hemorrhage or mass effect. - CTA head: No emergent large vessel occlusion. Normal major intracranial arteries. Asymmetric attenuated opacification of the mid to distal left pica relative to right - CT and neck: Normal - LDL: 117. - Hemoglobin A1c: 5.5. - 2-D echo: Pending - Therapies evaluation: Pending. - Not on antithrombotic spread to admission. Aspirin 325 MG daily started for secondary stroke reflexes but patient refused to take this morning stating that her reflux would get worse. Counseled appropriately. Patient passed swallow screen in ED. - ? Need for TEE if TTE negative. - Neurology consultation appreciated. Discussed with Neuro hospitalist and Stroke MD  Headache - Patient denies history of chronic headaches. - Could be related to acute left posterior  circulation infarcts.  - Discussed with neuro hospitalist: Trial of scheduled Tylenol and if not effective then when necessary low-dose IV morphine. Avoid NSAIDs due to risk for hemorrhagic transformation of strokes.  Essential hypertension - Allow permissive hypertension for approximately 48 hours and then gradually control. Hold carvedilol for now.  Hyperlipidemia - LDL 117. Goal <70. Start statins. - Improved. Monitor.  Glucose intolerance - A1c 5.5.  Epistaxis - Unclear etiology. No acute findings on anterior nasal exam. DC Lovenox and use SCDs for DVT prophylaxis.  Nausea and vomiting - Possibly related to symptomatic acute left posterior circulation strokes. Vomiting of blood was likely related to swallowed epistaxis. No emesis since last night. Diet as tolerated. IV PPI. Monitor closely. - KUB without acute findings.  GERD - As per spouse, recently underwent EGD and what appears to be dilatation for severe reflux. - Given severity of her symptoms, temporarily change Protonix to IV 40 mg twice a day. Diet as tolerated.  Anxiety and depression - Reviewed home medications, not on anything for this. Received a dose of Ativan when necessary in ED. Monitor.  Unexplained opiates in urine - No opiates listed on home medications or given in the ED. Patient denies use of opiates recently.   DVT prophylaxis: Discontinued Lovenox and started SCDs. Code Status: Full.  Family Communication: Discussed in detail with patient's spouse and mother at bedside in ED. Disposition: DC home pending stroke workup, therapies evaluation and recommendations.   Consultants:  Neurology   Procedures:  None  Antimicrobials:  None    Subjective: Seen early this morning while still in ED. Patient uncomfortable and crying and tearful due to headache. Refuses to answer any questions. History obtained from spouse at bedside. She was in  usual state of health until approximately 9-10 PM on 10/31 when  she started having dizziness, vertigo, headache, epistaxis, nausea and vomiting blood. After medications received last night in ED, nausea and vomiting resolved. No further epistaxis. Ongoing headache with temporary relief after a dose of Toradol. As per discussion with RN after a couple of hours, headache improved and reported patient refusing aspirin due to reflux symptoms.  ROS: Severe reflux.  Objective:  Vitals:   12/19/16 0747 12/19/16 0845 12/19/16 0919 12/19/16 0931  BP: 118/74  (!) 106/47   Pulse: (!) 136 (!) 111 (!) 104   Resp: 20  17   Temp:   99.1 F (37.3 C)   TempSrc:   Axillary   SpO2: 90% 98% 97%   Weight:    88.9 kg (196 lb)  Height:    5\' 6"  (1.676 m)    Examination:  General exam: Young female, moderately built and nourished, lying uncomfortably supine in the gurney in the ED in painful distress. ENT: Minimal dried blood around left anterior nare but no evidence of active bleeding or acute abnormalities. Respiratory system: Clear to auscultation. Respiratory effort normal. Cardiovascular system: S1 & S2 heard, RRR. No JVD, murmurs, rubs, gallops or clicks. No pedal edema. Telemetry: SR-ST in the 110s-120s. Gastrointestinal system: Abdomen is nondistended, soft and nontender. No organomegaly or masses felt. Normal bowel sounds heard. Central nervous system: Alert and oriented. No cranial nerve deficits.  Extremities: Symmetric 5 x 5 power. Skin: No rashes, lesions or ulcers Psychiatry: Judgement and insight appear normal. Mood & affect anxious and tearful.  Reviewed: I have personally reviewed following labs and imaging studies  CBC:  Recent Labs Lab 12/19/16 0009  WBC 14.8*  HGB 13.7  HCT 39.6  MCV 87.4  PLT 271   Basic Metabolic Panel:  Recent Labs Lab 12/19/16 0009  NA 135  K 3.8  CL 103  CO2 22  GLUCOSE 126*  BUN 13  CREATININE 1.03*  CALCIUM 8.9   Liver Function Tests:  Recent Labs Lab 12/19/16 0009  AST 19  ALT 16  ALKPHOS 67    BILITOT 0.9  PROT 6.6  ALBUMIN 4.0   Coagulation Profile:  Recent Labs Lab 12/19/16 0640  INR 1.02   HbA1C:  Recent Labs  12/19/16 0009  HGBA1C 5.5   CBG: No results for input(s): GLUCAP in the last 168 hours.  No results found for this or any previous visit (from the past 240 hour(s)).       Radiology Studies: Ct Angio Head W Or Wo Contrast  Result Date: 12/19/2016 CLINICAL DATA:  Ataxia.  Acute infarct demonstrated on earlier MRI. EXAM: CT ANGIOGRAPHY HEAD AND NECK TECHNIQUE: Multidetector CT imaging of the head and neck was performed using the standard protocol during bolus administration of intravenous contrast. Multiplanar CT image reconstructions and MIPs were obtained to evaluate the vascular anatomy. Carotid stenosis measurements (when applicable) are obtained utilizing NASCET criteria, using the distal internal carotid diameter as the denominator. CONTRAST:  50 mL Isovue 370 COMPARISON:  Brain MRI 12/19/2016 FINDINGS: CT HEAD FINDINGS Brain: No mass lesion, intraparenchymal hemorrhage or extra-axial collection. No evidence of acute cortical infarct. Brain parenchyma and CSF-containing spaces are normal for age. Skull: Normal visualized skull base, calvarium and extracranial soft tissues. Sinuses/Orbits: No sinus fluid levels or advanced mucosal thickening. No mastoid effusion. Normal orbits. Review of the MIP images confirms the above findings CTA NECK FINDINGS Aortic arch: There is no calcific atherosclerosis of the aortic arch. There  is no aneurysm, dissection or hemodynamically significant stenosis of the visualized ascending aorta and aortic arch. Conventional 3 vessel aortic branching pattern. The visualized proximal subclavian arteries are normal. Right carotid system: The right common carotid origin is widely patent. There is no common carotid or internal carotid artery dissection or aneurysm. No hemodynamically significant stenosis. Left carotid system: The left  common carotid origin is widely patent. There is no common carotid or internal carotid artery dissection or aneurysm. No hemodynamically significant stenosis. Vertebral arteries: The vertebral system is right dominant. Both vertebral artery origins are normal. Both vertebral arteries are normal to their confluence with the basilar artery. Skeleton: There is no bony spinal canal stenosis. No lytic or blastic lesions. Other neck: The nasopharynx is clear. The oropharynx and hypopharynx are normal. The epiglottis is normal. The supraglottic larynx, glottis and subglottic larynx are normal. No retropharyngeal collection. The parapharyngeal spaces are preserved. The parotid and submandibular glands are normal. No sialolithiasis or salivary ductal dilatation. The thyroid gland is normal. There is no cervical lymphadenopathy. Upper chest: No pneumothorax or pleural effusion. No nodules or masses. Review of the MIP images confirms the above findings CTA HEAD FINDINGS Anterior circulation: --Intracranial internal carotid arteries: Normal. --Anterior cerebral arteries: Normal. --Middle cerebral arteries: Normal. --Posterior communicating arteries: Absent bilaterally. Posterior circulation: --Posterior cerebral arteries: Normal. --Superior cerebellar arteries: Normal. --Basilar artery: Normal. --Anterior inferior cerebellar arteries: Not visualized, which is not uncommon. --Posterior inferior cerebellar arteries: There is attenuated opacification of the left PICA mid to distal portion. Venous sinuses: As permitted by contrast timing, patent. Anatomic variants: None Delayed phase: No parenchymal contrast enhancement. Review of the MIP images confirms the above findings IMPRESSION: 1. No emergent large vessel occlusion. Normal major intracranial arteries. 2. Asymmetrically attenuated opacification of the mid-to-distal left PICA relative to the right, which is the artery supplying the vascular territory of the cerebellar infarcts  seen on MRI. However, assessment of distal portions of the small posterior fossa arteries is not entirely reliable and this may be only normal variation. 3. Normal CTA of the neck. Electronically Signed   By: Deatra RobinsonKevin  Herman M.D.   On: 12/19/2016 06:45   Ct Angio Neck W Or Wo Contrast  Result Date: 12/19/2016 CLINICAL DATA:  Ataxia.  Acute infarct demonstrated on earlier MRI. EXAM: CT ANGIOGRAPHY HEAD AND NECK TECHNIQUE: Multidetector CT imaging of the head and neck was performed using the standard protocol during bolus administration of intravenous contrast. Multiplanar CT image reconstructions and MIPs were obtained to evaluate the vascular anatomy. Carotid stenosis measurements (when applicable) are obtained utilizing NASCET criteria, using the distal internal carotid diameter as the denominator. CONTRAST:  50 mL Isovue 370 COMPARISON:  Brain MRI 12/19/2016 FINDINGS: CT HEAD FINDINGS Brain: No mass lesion, intraparenchymal hemorrhage or extra-axial collection. No evidence of acute cortical infarct. Brain parenchyma and CSF-containing spaces are normal for age. Skull: Normal visualized skull base, calvarium and extracranial soft tissues. Sinuses/Orbits: No sinus fluid levels or advanced mucosal thickening. No mastoid effusion. Normal orbits. Review of the MIP images confirms the above findings CTA NECK FINDINGS Aortic arch: There is no calcific atherosclerosis of the aortic arch. There is no aneurysm, dissection or hemodynamically significant stenosis of the visualized ascending aorta and aortic arch. Conventional 3 vessel aortic branching pattern. The visualized proximal subclavian arteries are normal. Right carotid system: The right common carotid origin is widely patent. There is no common carotid or internal carotid artery dissection or aneurysm. No hemodynamically significant stenosis. Left carotid system: The left  common carotid origin is widely patent. There is no common carotid or internal carotid artery  dissection or aneurysm. No hemodynamically significant stenosis. Vertebral arteries: The vertebral system is right dominant. Both vertebral artery origins are normal. Both vertebral arteries are normal to their confluence with the basilar artery. Skeleton: There is no bony spinal canal stenosis. No lytic or blastic lesions. Other neck: The nasopharynx is clear. The oropharynx and hypopharynx are normal. The epiglottis is normal. The supraglottic larynx, glottis and subglottic larynx are normal. No retropharyngeal collection. The parapharyngeal spaces are preserved. The parotid and submandibular glands are normal. No sialolithiasis or salivary ductal dilatation. The thyroid gland is normal. There is no cervical lymphadenopathy. Upper chest: No pneumothorax or pleural effusion. No nodules or masses. Review of the MIP images confirms the above findings CTA HEAD FINDINGS Anterior circulation: --Intracranial internal carotid arteries: Normal. --Anterior cerebral arteries: Normal. --Middle cerebral arteries: Normal. --Posterior communicating arteries: Absent bilaterally. Posterior circulation: --Posterior cerebral arteries: Normal. --Superior cerebellar arteries: Normal. --Basilar artery: Normal. --Anterior inferior cerebellar arteries: Not visualized, which is not uncommon. --Posterior inferior cerebellar arteries: There is attenuated opacification of the left PICA mid to distal portion. Venous sinuses: As permitted by contrast timing, patent. Anatomic variants: None Delayed phase: No parenchymal contrast enhancement. Review of the MIP images confirms the above findings IMPRESSION: 1. No emergent large vessel occlusion. Normal major intracranial arteries. 2. Asymmetrically attenuated opacification of the mid-to-distal left PICA relative to the right, which is the artery supplying the vascular territory of the cerebellar infarcts seen on MRI. However, assessment of distal portions of the small posterior fossa arteries is  not entirely reliable and this may be only normal variation. 3. Normal CTA of the neck. Electronically Signed   By: Deatra Robinson M.D.   On: 12/19/2016 06:45   Mr Brain Wo Contrast  Result Date: 12/19/2016 CLINICAL DATA:  Ataxia EXAM: MRI HEAD WITHOUT CONTRAST TECHNIQUE: Multiplanar, multiecho pulse sequences of the brain and surrounding structures were obtained without intravenous contrast. COMPARISON:  Brain MRI 01/20/2016 FINDINGS: Brain: The midline structures are normal. There is diffusion restriction within the medial left cerebellum and is in the left occipital lobe. No mass lesion, hydrocephalus, dural abnormality or extra-axial collection. There is hyperintense T2-weighted signal at the sites of ischemia but otherwise the parenchymal signal of the brain is normal. No age-advanced or lobar predominant atrophy. No chronic microhemorrhage or superficial siderosis. Vascular: Major intracranial arterial and venous sinus flow voids are preserved. Skull and upper cervical spine: The visualized skull base, calvarium, upper cervical spine and extracranial soft tissues are normal. Sinuses/Orbits: No fluid levels or advanced mucosal thickening. No mastoid or middle ear effusion. Normal orbits. IMPRESSION: 1. Multifocal acute ischemia within the left posterior circulation, including the medial left cerebellum and left occipital lobe. No contralateral for anterior circulation ischemia. 2. No hemorrhage or mass effect. Electronically Signed   By: Deatra Robinson M.D.   On: 12/19/2016 03:18   Dg Abd Acute W/chest  Result Date: 12/19/2016 CLINICAL DATA:  46 year old female with abdominal pain. Recent endoscopy. EXAM: DG ABDOMEN ACUTE W/ 1V CHEST COMPARISON:  Abdominal ultrasound dated 11/13/2012 FINDINGS: The lungs are clear. Minimal bibasilar atelectatic changes/ scarring noted. There is no focal consolidation, pleural effusion, or pneumothorax. The cardiac silhouette is within normal limits. There is moderate stool  throughout the colon. No bowel dilatation or evidence of obstruction. No free air or radiopaque calculi. The osseous structures and soft tissues are grossly unremarkable. IMPRESSION: 1. No acute cardiopulmonary process. 2.  No bowel obstruction.  No free air. Electronically Signed   By: Elgie Collard M.D.   On: 12/19/2016 01:12        Scheduled Meds: .  stroke: mapping our early stages of recovery book   Does not apply Once  . acetaminophen  650 mg Oral TID  . aspirin  300 mg Rectal Daily   Or  . aspirin  325 mg Oral Daily  . atorvastatin  80 mg Oral q1800  . morphine      . pantoprazole  40 mg Oral Daily   Continuous Infusions: . sodium chloride 125 mL/hr at 12/19/16 0130     LOS: 0 days     Lacole Komorowski, MD, FACP, FHM. [facility] Pager [phone]  If 7PM-7AM, please contact night-coverage [email or web address] Password [email or web address] 12/19/2016, 11:39 AM

## 2016-12-19 NOTE — Progress Notes (Signed)
Rehab Admissions Coordinator Note:  Patient was screened by Clois DupesBoyette, Holliday Sheaffer Godwin for appropriateness for an Inpatient Acute Rehab Consult per PT recommendation.  At this time, we are recommending an inpt rehab consult. Please place order for an inpt rehab consult if pt would likt to be considered for an inpt rehab admission.   Clois DupesBoyette, Derrek Puff Godwin 12/19/2016, 8:03 PM  I can be reached at [phone].

## 2016-12-19 NOTE — ED Notes (Signed)
Pt is tearful and crying stating she just wants to go home. Refusing ICE or heat pack. MD aware of pts headache. Family at bedside. Lights dimmed.

## 2016-12-19 NOTE — Progress Notes (Signed)
PT Cancellation [name]  Patient Details Name: Elta GuadeloupeDorothy Van [name] [name] MRN: [id number] DOB: 05-25-1970   Cancelled Treatment:    Reason Eval/Treat Not Completed: Other (comment) Pt currently sleeping and family requesting to allow pt to sleep. Will follow up as schedule allows.   [name], PT, DPT  Acute Rehabilitation Services  Pager: [phone]  Lehman PromBrittany S Camika Marsico 12/19/2016, 10:53 AM

## 2016-12-19 NOTE — Progress Notes (Signed)
    [facility] has been requested to perform a transesophageal echocardiogram on 11/02 for CVA.  After careful review of history and examination, the risks and benefits of transesophageal echocardiogram have been explained including risks of esophageal damage, perforation (1:10,000 risk), bleeding, pharyngeal hematoma as well as other potential complications associated with conscious sedation including aspiration, arrhythmia, respiratory failure and death. Alternatives to treatment were discussed, questions were answered. Patient is willing to proceed.   Leanna BattlesBarrett, Rhonda, PA-C 12/19/2016 2:59 PM

## 2016-12-19 NOTE — ED Provider Notes (Signed)
TIME SEEN: 12:14 AM  CHIEF COMPLAINT: Vertigo  HPI: Patient is a 46 year old female with history of GERD, hyperlipidemia, depression and anxiety who presents to the emergency department with sudden onset vertigo.  She states around 9 - 10 PM while watching the TV she felt like her "eyes were jumping" and that nothing was staying still.  She states she been feeling very nauseated and began vomiting.  After vomiting so much she began to have a left-sided nosebleed which has stopped.  She also started having upper abdominal pain which is typical of her GERD.  No fevers, diarrhea.  Complaining of mild diffuse headache.  No head injury.  Not on blood thinners.  Patient has been reports that she recently had an endoscopy and had "something stretched".  Patient began having anxiety attack in the waiting room was hyperventilating.  She had a syncopal events.  No chest pain or shortness of breath.  No numbness, tingling or focal weakness.  ROS: See HPI Constitutional: no fever  Eyes: no drainage  ENT: no runny nose   Cardiovascular:  no chest pain  Resp: no SOB  GI: no vomiting GU: no dysuria Integumentary: no rash  Allergy: no hives  Musculoskeletal: no leg swelling  Neurological: no slurred speech ROS otherwise negative  PAST MEDICAL HISTORY/PAST SURGICAL HISTORY:  Past Medical History:  Diagnosis Date  . Anemia   . Anxiety   . Depression   . GERD (gastroesophageal reflux disease)   . Hyperlipidemia   . IBS (irritable bowel syndrome)   . Restless leg syndrome     MEDICATIONS:  Prior to Admission medications   Medication Sig Start Date End Date Taking? Authorizing Provider  ALPRAZOLAM XR 1 MG 24 hr tablet Take 1 tablet (1 mg total) by mouth daily. Fill June 6th and monthly thereafter 10/10/16   Alba Cory, MD  carvedilol (COREG) 3.125 MG tablet Take 1 tablet (3.125 mg total) by mouth 2 (two) times daily with a meal. 10/10/16   Carlynn Purl, Danna Hefty, MD  cyclobenzaprine (FLEXERIL) 10 MG  tablet Take 1 tablet (10 mg total) by mouth daily. 10/10/16   Alba Cory, MD  Icosapent Ethyl (VASCEPA) 1 g CAPS Take 2 g by mouth 2 (two) times daily. 10/10/16   Alba Cory, MD  Multiple Vitamins-Minerals (CENTRUM PO) Take 1 tablet by mouth daily. Reported on 02/02/2015    [provider]  pantoprazole (PROTONIX) 40 MG tablet Take 1 tablet (40 mg total) by mouth daily. 07/18/16   Doren Custard, FNP  PARoxetine (PAXIL) 40 MG tablet Take 1 tablet (40 mg total) by mouth daily. 10/10/16   Alba Cory, MD  polyethylene glycol powder (GLYCOLAX/MIRALAX) powder 17 grams daily 11/13/16   Wyline Mood, MD  zolpidem [facility]) 10 MG tablet take 1 tablet by mouth at bedtime if needed 10/10/16   Alba Cory, MD    ALLERGIES:  Allergies  Allergen Reactions  . Azithromycin Diarrhea  . Peanuts [Peanut Oil] Swelling    "swelling of lips"   . Penicillins Hives and Swelling    Has patient had a PCN reaction causing immediate rash, facial/tongue/throat swelling, SOB or lightheadedness with hypotension: Yes Has patient had a PCN reaction causing severe rash involving mucus membranes or skin necrosis: No Has patient had a PCN reaction that required hospitalization No Has patient had a PCN reaction occurring within the last 10 years: No If all of the above answers are "NO", then may proceed with Cephalosporin use.    SOCIAL HISTORY:  Social History  Substance Use Topics  . Smoking status: Never Smoker  . Smokeless tobacco: Never Used  . Alcohol use No    FAMILY HISTORY: Family History  Problem Relation Age of Onset  . Hypertension Mother   . Hyperlipidemia Mother   . Heart Problems Father        hole in heart  . Prostate cancer Maternal Grandfather     EXAM: BP 117/84 (BP Location: Right Arm)   Pulse (!) 124   Temp 98.3 F (36.8 C) (Oral)   Resp (!) 25   Ht 5\' 9"  (1.753 m)   Wt 83.9 kg (185 lb)   LMP 01/15/2015 (Exact Date)   SpO2 95%   BMI 27.32 kg/m   CONSTITUTIONAL: Alert and oriented and responds appropriately to questions.  Very anxious, uncooperative, appears uncomfortable HEAD: Normocephalic, atraumatic EYES: Conjunctivae clear, pupils appear equal, EOMI, unable to test for nystagmus as patient is not able to keep her eyes open for very long due to being very symptomatic ENT: normal nose; moist mucous membranes, dried blood in the left nostril without any active bleeding, no blood in the posterior oropharynx NECK: Supple, no meningismus, no nuchal rigidity, no LAD  CARD: Regular and tachycardic; S1 and S2 appreciated; no murmurs, no clicks, no rubs, no gallops RESP: Normal chest excursion without splinting, patient is hyperventilating; breath sounds clear and equal bilaterally; no wheezes, no rhonchi, no rales, no hypoxia or respiratory distress, speaking full sentences ABD/GI: Normal bowel sounds; non-distended; soft, and only tender in the epigastric region, no rebound, no guarding, no peritoneal signs, no hepatosplenomegaly BACK:  The back appears normal and is non-tender to palpation, there is no CVA tenderness EXT: Normal ROM in all joints; non-tender to palpation; no edema; normal capillary refill; no cyanosis, no calf tenderness or swelling    SKIN: Normal color for age and race; warm; no rash NEURO: Moves all extremities equally, reports normal sensation diffusely, cranial nerves II through XII appear intact, normal speech, no pronator drift, unable to test for nystagmus or ataxia or dysmetria due to patient being very symptomatic and uncooperative; NIHSS 0; patient has been able to ambulate in the room without ataxic gait PSYCH: Patient is very anxious.  MEDICAL DECISION MAKING: Patient here with what sounds like vertigo.  She has no significant risk factors for stroke so I suspect this is peripheral vertigo but given how severely symptomatic patient has, we will obtain an MRI of her brain as I have suspicion that we may need to  admit her to the hospital for symptom control.  I suspect her syncopal event was from hyperventilation from a panic attack.  She did have a left-sided nosebleed but is not bleeding currently.  I do not see any focal neurologic deficits on exam.  NIHSS is 0.  Not a TPA candidate given this stroke scale.  No chest pain or shortness of breath.  She does have upper abdominal tenderness that she reports is similar to her GERD but did have an endoscopy.  We will obtain acute abdominal series.  Will obtain labs, urine.  Will give IV fluids, IV benzodiazepines, Zofran.  ED PROGRESS: 1:20 AM  Patient reports abdominal pain improving with Protonix and GI cocktail.  Nausea also resolved.  Still having dizziness.  Will give oral meclizine.  Labs show mild leukocytosis but otherwise unremarkable.  Acute abdominal series negative.  Urine and MRI of the brain pending.   3:10 AM  Pt is completely uncooperative and does not answer questions when  asked.  Her husband is equally uncooperative and equally unhelpful.  This significantly limits the patient's examination.  I think that her syncopal event today was vasovagal in nature.  Doubt ACS, PE, dissection.  Patient still complaining of some nausea.  Will give second dose of Zofran.  Complaining of mild headache.  Will give Toradol.  She reports her dizziness has resolved.    3:40 AM  Pt's MRI shows multifocal acute ischemia within the left posterior circulation including the left cerebellum and left occipital lobe.  No hemorrhage or mass-effect.  Will discuss with neurology on call.  Discussed this with patient and family.  Patient now resting comfortably.  She has no history of hypertension, diabetes, tobacco use.  No history of arrhythmia.  No history of previous stroke.   4:15 AM  Dr. Amada JupiterKirkpatrick with neurology is seeing the patient currently.  He agrees that he would not have given TPA to the patient since her NIH stroke scale was 0.  Recommends medical  admission.  4:28 AM Discussed patient's case with hospitalist, Dr. Julian ReilGardner.  I have recommended admission and patient (and family if present) agree with this plan. Admitting physician will place admission orders.   I reviewed all nursing notes, vitals, pertinent previous records, EKGs, lab and urine results, imaging (as available).    EKG Interpretation  Date/Time:  Thursday December 19 2016 04:13:57 EDT Ventricular Rate:  99 PR Interval:    QRS Duration: 96 QT Interval:  368 QTC Calculation: 473 R Axis:   80 Text Interpretation:  Sinus rhythm Confirmed by Rochele RaringWard, Kristen [phone]([id number]) on 12/19/2016 4:18:42 AM         Ward, Layla MawKristen N, DO 12/19/16 [id number]

## 2016-12-19 NOTE — Progress Notes (Signed)
Patient got up out of bed without calling out for help to the bathroom. RN instructed patient how to call out next time. Patient verbalized that she understands how to call for help and will do so next time.

## 2016-12-19 NOTE — Progress Notes (Signed)
Pt. Arrived to unit Alert and Oriented X4 . Pt. Stated she had a Headache. Pt. And family was Oriented to the room and equipment.

## 2016-12-19 NOTE — ED Notes (Signed)
Patient currently at MRI

## 2016-12-19 NOTE — Progress Notes (Signed)
STROKE TEAM PROGRESS NOTE   SUBJECTIVE (INTERVAL HISTORY) Her mother and husband are at the bedside.  Patient is laying in bed in NAD Overall she feels her condition is unchanged.  Voices no new complaints. No new events reported overnight.  Patient interviewed, denies any hx of smoking, BCP, clotting disorders or migraines. Admits to hx of miscarriages x 2.   POC reviewed with patient and family at length. Verbalize good understanding of the information given regarding patients status and future testing.  OBJECTIVE No results for input(s): GLUCAP in the last 168 hours.  Recent Labs Lab 12/19/16 0009  NA 135  K 3.8  CL 103  CO2 22  GLUCOSE 126*  BUN 13  CREATININE 1.03*  CALCIUM 8.9    Recent Labs Lab 12/19/16 0009  AST 19  ALT 16  ALKPHOS 67  BILITOT 0.9  PROT 6.6  ALBUMIN 4.0    Recent Labs Lab 12/19/16 0009  WBC 14.8*  HGB 13.7  HCT 39.6  MCV 87.4  PLT 271   No results for input(s): CKTOTAL, CKMB, CKMBINDEX, TROPONINI in the last 168 hours.  Recent Labs  12/19/16 0640  LABPROT 13.3  INR 1.02    Recent Labs  12/19/16 0310  COLORURINE STRAW*  LABSPEC 1.011  PHURINE 7.0  GLUCOSEU NEGATIVE  HGBUR NEGATIVE  BILIRUBINUR NEGATIVE  KETONESUR NEGATIVE  PROTEINUR NEGATIVE  NITRITE NEGATIVE  LEUKOCYTESUR NEGATIVE       Component Value Date/Time   CHOL 230 (H) 12/19/2016 0009   CHOL 242 (H) 04/26/2016 0836   TRIG 378 (H) 12/19/2016 0009   HDL 37 (L) 12/19/2016 0009   HDL 46 04/26/2016 0836   CHOLHDL 6.2 12/19/2016 0009   VLDL 76 (H) 12/19/2016 0009   LDLCALC 117 (H) 12/19/2016 0009   LDLCALC 132 (H) 04/26/2016 0836   Lab Results  Component Value Date   HGBA1C 5.5 12/19/2016      Component Value Date/Time   LABOPIA POSITIVE (A) 12/19/2016 0310   COCAINSCRNUR NONE DETECTED 12/19/2016 0310   LABBENZ POSITIVE (A) 12/19/2016 0310   AMPHETMU NONE DETECTED 12/19/2016 0310   THCU NONE DETECTED 12/19/2016 0310   LABBARB NONE DETECTED  12/19/2016 0310    No results for input(s): ETH in the last 168 hours.  IMAGING: I have personally reviewed the radiological images below and agree with the radiology interpretations.  Ct Angio Head W Or Wo Contrast  Result Date: 12/19/2016 CLINICAL DATA:  Ataxia.  Acute infarct demonstrated on earlier MRI. EXAM: CT ANGIOGRAPHY HEAD AND NECK TECHNIQUE: Multidetector CT imaging of the head and neck was performed using the standard protocol during bolus administration of intravenous contrast. Multiplanar CT image reconstructions and MIPs were obtained to evaluate the vascular anatomy. Carotid stenosis measurements (when applicable) are obtained utilizing NASCET criteria, using the distal internal carotid diameter as the denominator. CONTRAST:  50 mL Isovue 370 COMPARISON:  Brain MRI 12/19/2016 FINDINGS: CT HEAD FINDINGS Brain: No mass lesion, intraparenchymal hemorrhage or extra-axial collection. No evidence of acute cortical infarct. Brain parenchyma and CSF-containing spaces are normal for age. Skull: Normal visualized skull base, calvarium and extracranial soft tissues. Sinuses/Orbits: No sinus fluid levels or advanced mucosal thickening. No mastoid effusion. Normal orbits. Review of the MIP images confirms the above findings CTA NECK FINDINGS Aortic arch: There is no calcific atherosclerosis of the aortic arch. There is no aneurysm, dissection or hemodynamically significant stenosis of the visualized ascending aorta and aortic arch. Conventional 3 vessel aortic branching pattern. The visualized proximal subclavian arteries  are normal. Right carotid system: The right common carotid origin is widely patent. There is no common carotid or internal carotid artery dissection or aneurysm. No hemodynamically significant stenosis. Left carotid system: The left common carotid origin is widely patent. There is no common carotid or internal carotid artery dissection or aneurysm. No hemodynamically significant  stenosis. Vertebral arteries: The vertebral system is right dominant. Both vertebral artery origins are normal. Both vertebral arteries are normal to their confluence with the basilar artery. Skeleton: There is no bony spinal canal stenosis. No lytic or blastic lesions. Other neck: The nasopharynx is clear. The oropharynx and hypopharynx are normal. The epiglottis is normal. The supraglottic larynx, glottis and subglottic larynx are normal. No retropharyngeal collection. The parapharyngeal spaces are preserved. The parotid and submandibular glands are normal. No sialolithiasis or salivary ductal dilatation. The thyroid gland is normal. There is no cervical lymphadenopathy. Upper chest: No pneumothorax or pleural effusion. No nodules or masses. Review of the MIP images confirms the above findings CTA HEAD FINDINGS Anterior circulation: --Intracranial internal carotid arteries: Normal. --Anterior cerebral arteries: Normal. --Middle cerebral arteries: Normal. --Posterior communicating arteries: Absent bilaterally. Posterior circulation: --Posterior cerebral arteries: Normal. --Superior cerebellar arteries: Normal. --Basilar artery: Normal. --Anterior inferior cerebellar arteries: Not visualized, which is not uncommon. --Posterior inferior cerebellar arteries: There is attenuated opacification of the left PICA mid to distal portion. Venous sinuses: As permitted by contrast timing, patent. Anatomic variants: None Delayed phase: No parenchymal contrast enhancement. Review of the MIP images confirms the above findings IMPRESSION: 1. No emergent large vessel occlusion. Normal major intracranial arteries. 2. Asymmetrically attenuated opacification of the mid-to-distal left PICA relative to the right, which is the artery supplying the vascular territory of the cerebellar infarcts seen on MRI. However, assessment of distal portions of the small posterior fossa arteries is not entirely reliable and this may be only normal  variation. 3. Normal CTA of the neck. Electronically Signed   By: Ulyses Jarred M.D.   On: 12/19/2016 06:45   Ct Angio Neck W Or Wo Contrast  Result Date: 12/19/2016 CLINICAL DATA:  Ataxia.  Acute infarct demonstrated on earlier MRI. EXAM: CT ANGIOGRAPHY HEAD AND NECK TECHNIQUE: Multidetector CT imaging of the head and neck was performed using the standard protocol during bolus administration of intravenous contrast. Multiplanar CT image reconstructions and MIPs were obtained to evaluate the vascular anatomy. Carotid stenosis measurements (when applicable) are obtained utilizing NASCET criteria, using the distal internal carotid diameter as the denominator. CONTRAST:  50 mL Isovue 370 COMPARISON:  Brain MRI 12/19/2016 FINDINGS: CT HEAD FINDINGS Brain: No mass lesion, intraparenchymal hemorrhage or extra-axial collection. No evidence of acute cortical infarct. Brain parenchyma and CSF-containing spaces are normal for age. Skull: Normal visualized skull base, calvarium and extracranial soft tissues. Sinuses/Orbits: No sinus fluid levels or advanced mucosal thickening. No mastoid effusion. Normal orbits. Review of the MIP images confirms the above findings CTA NECK FINDINGS Aortic arch: There is no calcific atherosclerosis of the aortic arch. There is no aneurysm, dissection or hemodynamically significant stenosis of the visualized ascending aorta and aortic arch. Conventional 3 vessel aortic branching pattern. The visualized proximal subclavian arteries are normal. Right carotid system: The right common carotid origin is widely patent. There is no common carotid or internal carotid artery dissection or aneurysm. No hemodynamically significant stenosis. Left carotid system: The left common carotid origin is widely patent. There is no common carotid or internal carotid artery dissection or aneurysm. No hemodynamically significant stenosis. Vertebral arteries: The vertebral system  is right dominant. Both vertebral  artery origins are normal. Both vertebral arteries are normal to their confluence with the basilar artery. Skeleton: There is no bony spinal canal stenosis. No lytic or blastic lesions. Other neck: The nasopharynx is clear. The oropharynx and hypopharynx are normal. The epiglottis is normal. The supraglottic larynx, glottis and subglottic larynx are normal. No retropharyngeal collection. The parapharyngeal spaces are preserved. The parotid and submandibular glands are normal. No sialolithiasis or salivary ductal dilatation. The thyroid gland is normal. There is no cervical lymphadenopathy. Upper chest: No pneumothorax or pleural effusion. No nodules or masses. Review of the MIP images confirms the above findings CTA HEAD FINDINGS Anterior circulation: --Intracranial internal carotid arteries: Normal. --Anterior cerebral arteries: Normal. --Middle cerebral arteries: Normal. --Posterior communicating arteries: Absent bilaterally. Posterior circulation: --Posterior cerebral arteries: Normal. --Superior cerebellar arteries: Normal. --Basilar artery: Normal. --Anterior inferior cerebellar arteries: Not visualized, which is not uncommon. --Posterior inferior cerebellar arteries: There is attenuated opacification of the left PICA mid to distal portion. Venous sinuses: As permitted by contrast timing, patent. Anatomic variants: None Delayed phase: No parenchymal contrast enhancement. Review of the MIP images confirms the above findings IMPRESSION: 1. No emergent large vessel occlusion. Normal major intracranial arteries. 2. Asymmetrically attenuated opacification of the mid-to-distal left PICA relative to the right, which is the artery supplying the vascular territory of the cerebellar infarcts seen on MRI. However, assessment of distal portions of the small posterior fossa arteries is not entirely reliable and this may be only normal variation. 3. Normal CTA of the neck. Electronically Signed   By: Ulyses Jarred M.D.   On:  12/19/2016 06:45   Mr Brain Wo Contrast  Result Date: 12/19/2016 CLINICAL DATA:  Ataxia EXAM: MRI HEAD WITHOUT CONTRAST TECHNIQUE: Multiplanar, multiecho pulse sequences of the brain and surrounding structures were obtained without intravenous contrast. COMPARISON:  Brain MRI 01/20/2016 FINDINGS: Brain: The midline structures are normal. There is diffusion restriction within the medial left cerebellum and is in the left occipital lobe. No mass lesion, hydrocephalus, dural abnormality or extra-axial collection. There is hyperintense T2-weighted signal at the sites of ischemia but otherwise the parenchymal signal of the brain is normal. No age-advanced or lobar predominant atrophy. No chronic microhemorrhage or superficial siderosis. Vascular: Major intracranial arterial and venous sinus flow voids are preserved. Skull and upper cervical spine: The visualized skull base, calvarium, upper cervical spine and extracranial soft tissues are normal. Sinuses/Orbits: No fluid levels or advanced mucosal thickening. No mastoid or middle ear effusion. Normal orbits. IMPRESSION: 1. Multifocal acute ischemia within the left posterior circulation, including the medial left cerebellum and left occipital lobe. No contralateral for anterior circulation ischemia. 2. No hemorrhage or mass effect. Electronically Signed   By: Ulyses Jarred M.D.   On: 12/19/2016 03:18   Dg Abd Acute W/chest  Result Date: 12/19/2016 CLINICAL DATA:  46 year old female with abdominal pain. Recent endoscopy. EXAM: DG ABDOMEN ACUTE W/ 1V CHEST COMPARISON:  Abdominal ultrasound dated 11/13/2012 FINDINGS: The lungs are clear. Minimal bibasilar atelectatic changes/ scarring noted. There is no focal consolidation, pleural effusion, or pneumothorax. The cardiac silhouette is within normal limits. There is moderate stool throughout the colon. No bowel dilatation or evidence of obstruction. No free air or radiopaque calculi. The osseous structures and soft  tissues are grossly unremarkable. IMPRESSION: 1. No acute cardiopulmonary process. 2. No bowel obstruction.  No free air. Electronically Signed   By: Anner Crete M.D.   On: 12/19/2016 01:12    PHYSICAL EXAM Temp:  [  98.3 F (36.8 C)-99.1 F (37.3 C)] 98.4 F (36.9 C) (11/01 1300) Pulse Rate:  [94-136] 94 (11/01 1100) Resp:  [14-29] 16 (11/01 1300) BP: (103-149)/(47-118) 119/60 (11/01 1300) SpO2:  [90 %-100 %] 97 % (11/01 1300) Weight:  [83.9 kg (185 lb)-88.9 kg (196 lb)] 88.9 kg (196 lb) (11/01 0931)  General - Well nourished, well developed middle-aged Caucasian lady including the forehead, in no apparent distress Respiratory - Unlabored breathing noted on exam Cardiovascular - Regular rate and rhythm   Neuro: Exam is partially functional,non focal exam, patient is not completely cooperative with testing. Mental Status: Patient is awake, alert, oriented to person, place, month, year, and situation. Patient is able to give a clear and coherent history. No signs of aphasia or neglect Cranial Nerves: II: Visual Fields are full. Pupils are equal, round, and reactive to light.   III,IV, VI: EOMI without ptosis.  She has nystagmus, but keeps her eyes mostly closed which makes assessing this difficult. She is complaining of "blurred vision and a blind spot" V: Facial sensation is symmetric to touch VII: Facial movement is symmetric.  VIII: hearing is intact to voice X: Uvula elevates symmetrically XI: Shoulder shrug is symmetric. XII: tongue is midline without atrophy or fasciculations.  Motor: Tone is normal. Bulk is normal.  She gives poor effort on strength testin, but has no drift in any extremity. Sensory: Sensation is diminished on the right side including the forehead region. Does not appear to be organic, true numbness from the stroke Cerebellar: She does not open eyes or cooperate very well with finger-nose-finger testing, appears relatively symmetric in the arms    ASSESSMENT AND PLAN: [name] Note Debruler is a 46 y.o. female with PMH of anxiety, depression, psychogenic non-epileptic seizures.  Patient presents to the ED with c/o dizziness and blurred vision.  Acute, Small, Left Cerebellar and Left Occipital Infarcts: secondary to embolic source   Resultant:           Patient complains of Blurred vision and Right sided                                               Numbness on exam today   ECHO: Study Conclusions - Left ventricle: The cavity size was normal. Systolic function was   normal. The estimated ejection fraction was in the range of 55%   to 60%. Wall motion was normal; there were no regional wall   motion abnormalities. Impressions: - No cardiac source of emboli was indentified.   TEE:                                    PENDING IN AM   B/L LE Venous U/S:           PENDING    VTE prophylaxis: SCD's   Diet Heart Room service appropriate? Yes; Fluid consistency: Thin  Diet NPO time specified  Diet NPO time specified    No antithrombotic prior to admission, now on aspirin 325 mg daily.   Please discharge patient on  aspirin 325 mg daily  Patient counseled to be compliant with her antithrombotic medications        Therapy recommendations:  PENDING       Disposition:   PENDING  Recommendations:   TEE in AM with Loop Recorder Placement if TEE negative  Checking Labs: Antiphos Antibody, ANA ESR  Checking Venous Dopplers LE's  Ordered ASA 325 mg and Lipitor 80 mg daily  Ongoing aggressive stroke risk factor management        Follow up Appointments:    PCP follow up   Follow up with [facility], Dr Leonie Man in 6 weeks  Diabetes:  HgbA1c  5.5  goal < 7.0  Controlled  Hypertension: Stable Permissive hypertension (OK if <220/120) for 24-48 hours post stroke and then gradually normalized within 5-7 days.  Long term BP goal normotensive. May slowly restart home B/P medications after  48 hours with close PCP follow up.  Hyperlipidemia:  Home meds:  NONE  LDL  117 , goal < 70  Now on  Lipitor to 80 mg daily  Continue statin at discharge  Other Stroke Risk Factors:  ETOH use  Obesity, Body mass index is 31.64 kg/m.   Other Active Problems:  Epistaxis  GERD with N/V  Anxiety/Depression  +UDS  Hospital day # 0  Renie Ora Stroke Neurology Team 12/19/2016 6:50 PM I have personally examined this patient, reviewed notes, independently viewed imaging studies, participated in medical decision making and plan of care.ROS completed by me personally and pertinent positives fully documented  I have made any additions or clarifications directly to the above note. Agree with note above.  She presented with dizziness, vertigo, blurred vision and left-sided numbness and brain MRI shows tiny left cerebellar and occipital infarcts of embolic etiology.  She has no significant known vascular risk factors except mild obesity, smoking and hyperlipidemia.  Recommend start aspirin and statin.  Check the in the morning and if negative loop recorder placement for cryptogenic stroke.  Physical occupational therapy consult.  Long discussion with the patient at the bedside and answered questions.  Discussed with Dr. Algis Liming.  Greater than 50% time during this 35-minute visit was spent on counseling and coordination of care about her cryptogenic stroke and answering questions.  Antony Contras, MD Medical Director [facility] Pager: [phone] 12/19/2016 7:42 PM  To contact Stroke Continuity provider, please refer to [email or web address] After hours, contact General Neurology

## 2016-12-19 NOTE — Care Management Note (Signed)
Case Management Note  Patient Details  Name: Alicia [name] MRN: [id number] Date of Birth: [birth year]  Subjective/Objective:  Pt admitted with CVA. She is from home with her spouse.                  Action/Plan: Awaiting PT/OT recommendations. CM following for d/c needs, physician orders.   Expected Discharge Date:                  Expected Discharge Plan:     In-House Referral:     Discharge planning Services     Post Acute Care Choice:    Choice offered to:     DME Arranged:    DME Agency:     HH Arranged:    HH Agency:     Status of Service:  In process, will continue to follow  If discussed at Long Length of Stay Meetings, dates discussed:    Additional Comments:  Kermit BaloKelli F Brownie Gockel, RN 12/19/2016, 1:44 PM

## 2016-12-19 NOTE — Evaluation (Signed)
Speech Language Pathology Evaluation Patient Details Name: Alicia [name] Alicia [name] MRN: [id number] DOB: January 04, 1971 Today's Date: 12/19/2016 Time: [id number] SLP Time Calculation (min) (ACUTE ONLY): 13 min  Problem List:  Patient Active Problem List   Diagnosis Date Noted  . Acute arterial ischemic stroke, multifocal, posterior circulation (HCC) 12/19/2016  . Psychogenic nonepileptic seizure   . Chronic pain syndrome   . Restless leg syndrome   . Altered mental status 01/20/2016  . Chronic prescription benzodiazepine use 01/20/2016  . Leukocytosis 01/20/2016  . Seizure-like activity (HCC)   . GAD (generalized anxiety disorder) 02/14/2015  . Neck pain 02/14/2015  . History of hysterectomy 02/14/2015  . S/P hysterectomy 02/02/2015  . Grieving 12/16/2014  . Iron deficiency anemia due to chronic blood loss 08/29/2014  . Insomnia, persistent 08/14/2014  . Major depression (HCC) 08/14/2014  . Temporomandibular joint sounds on opening and/or closing the jaw 08/14/2014  . Degenerative disc disease, lumbar 08/14/2014  . Bleeding internal hemorrhoids 08/14/2014  . Gastric reflux 08/14/2014  . Blood glucose elevated 08/14/2014  . Irritable bowel syndrome with constipation 08/14/2014  . Hypertriglyceridemia 08/14/2014  . Overweight 08/14/2014  . Restless legs syndrome 08/14/2014  . Tinnitus 08/14/2014  . Vitamin D deficiency 08/14/2014  . Tachycardia 11/25/2012  . DOE (dyspnea on exertion) 11/06/2012  . Previous cesarean delivery, delivered, with or without mention of antepartum condition 01/19/2012   Past Medical History:  Past Medical History:  Diagnosis Date  . Anemia   . Anxiety   . Depression   . GERD (gastroesophageal reflux disease)   . Hyperlipidemia   . IBS (irritable bowel syndrome)   . Restless leg syndrome    Past Surgical History:  Past Surgical History:  Procedure Laterality Date  . ABDOMINOPLASTY  Feb. 2015  . BREAST SURGERY Bilateral December 2015   Breast  Reduction  . CESAREAN SECTION  2011  . COLONOSCOPY WITH PROPOFOL N/A 11/26/2016   Procedure: COLONOSCOPY WITH PROPOFOL;  Surgeon: Wyline MoodAnna, Kiran, MD;  [facility];  Service: Gastroenterology;  Laterality: N/A;  . CYSTOSCOPY  02/02/2015   Procedure: CYSTOSCOPY;  Surgeon: Nadara Mustardobert P Harris, MD;  Location: [facility] ORS;  Service: Gynecology;;  . Alicia GaskinsILATION AND CURETTAGE OF UTERUS  2003, 2005, 2008  . ESOPHAGOGASTRODUODENOSCOPY (EGD) WITH PROPOFOL N/A 11/26/2016   Procedure: ESOPHAGOGASTRODUODENOSCOPY (EGD) WITH PROPOFOL;  Surgeon: Wyline MoodAnna, Kiran, MD;  [facility];  Service: Gastroenterology;  Laterality: N/A;  . HERNIA REPAIR  1999   Umbilical  . KNEE SURGERY Right 2006   Knee Arthroscopy  . LAPAROSCOPIC BILATERAL SALPINGECTOMY Bilateral 02/02/2015   Procedure: LAPAROSCOPIC BILATERAL SALPINGECTOMY;  Surgeon: Nadara Mustardobert P Harris, MD;  Location: [facility] ORS;  Service: Gynecology;  Laterality: Bilateral;  . LAPAROSCOPIC HYSTERECTOMY N/A 02/02/2015   Procedure: HYSTERECTOMY TOTAL LAPAROSCOPIC;  Surgeon: Nadara Mustardobert P Harris, MD;  Location: [facility] ORS;  Service: Gynecology;  Laterality: N/A;   HPI:  Alicia [name] [name] a 46 y.o.femalewith medical history significant of anxiety, depression, psychogenic non-epileptic seizures. Patient presents to the ED with c/o dizziness. MRI shows small cerebellar infarct.    Assessment / Plan / Recommendation Clinical Impression  Cognitive-linguistic evaluation complete. Patient scoring WFL on all areas of the Cognistat however required moderate-max verbal encouragement to participate, eyes remaining closed throughout evaluation, and patient with delayed responses. Noted to be occassionally perseverative outside of evaluation. Strongly suspect that behaviors are related to a combination of fatigue, anxiety, and baseline psychiatric component however cannot r/o cerebellar cognitive affective syndrome impacting attention and executive functioning. No f/u skilled  SLP treatment warranted at this time however  education complete with patient and family regarding recommendations to f/u to MD after d/c if deficits do not improve in the short term.     SLP Assessment  SLP Recommendation/Assessment: Patient does not need any further Speech Lanaguage Pathology Services SLP Visit Diagnosis: Cognitive communication deficit (R41.841)    Follow Up Recommendations  None    Frequency and Duration           SLP Evaluation Cognition  Overall Cognitive Status: Difficult to assess (see clinical impression)       Comprehension  Auditory Comprehension Overall Auditory Comprehension: Appears within functional limits for tasks assessed Reading Comprehension Reading Status: Not tested    Expression Expression Primary Mode of Expression: Verbal Verbal Expression Overall Verbal Expression: Appears within functional limits for tasks assessed   Oral / Motor  Oral Motor/Sensory Function Overall Oral Motor/Sensory Function: Within functional limits Motor Speech Overall Motor Speech: Appears within functional limits for tasks assessed               Alicia Lango MA, CCC-SLP [phone]          Alicia [name] 12/19/2016, 4:03 PM

## 2016-12-19 NOTE — ED Notes (Signed)
MD Paged regarding 10/10 headache.

## 2016-12-19 NOTE — ED Notes (Signed)
Patient transported to X-ray 

## 2016-12-19 NOTE — ED Notes (Signed)
Admitting md at bedside

## 2016-12-20 ENCOUNTER — Inpatient Hospital Stay (HOSPITAL_COMMUNITY): Payer: BLUE CROSS/BLUE SHIELD | Admitting: Certified Registered Nurse Anesthetist

## 2016-12-20 ENCOUNTER — Inpatient Hospital Stay (HOSPITAL_COMMUNITY): Payer: BLUE CROSS/BLUE SHIELD

## 2016-12-20 ENCOUNTER — Encounter (HOSPITAL_COMMUNITY)
Admission: EM | Disposition: A | Discharge status: Home Health | Payer: Self-pay | Source: Home / Self Care | Attending: Internal Medicine

## 2016-12-20 ENCOUNTER — Encounter (HOSPITAL_COMMUNITY): Payer: Self-pay | Admitting: Physical Medicine and Rehabilitation

## 2016-12-20 DIAGNOSIS — I340 Nonrheumatic mitral (valve) insufficiency: Secondary | ICD-10-CM

## 2016-12-20 DIAGNOSIS — I63532 Cerebral infarction due to unspecified occlusion or stenosis of left posterior cerebral artery: Secondary | ICD-10-CM

## 2016-12-20 DIAGNOSIS — I6389 Other cerebral infarction: Secondary | ICD-10-CM

## 2016-12-20 DIAGNOSIS — R51 Headache: Secondary | ICD-10-CM

## 2016-12-20 DIAGNOSIS — I6300 Cerebral infarction due to thrombosis of unspecified precerebral artery: Secondary | ICD-10-CM

## 2016-12-20 HISTORY — PX: LOOP RECORDER INSERTION: EP1214

## 2016-12-20 HISTORY — PX: TEE WITHOUT CARDIOVERSION: SHX5443

## 2016-12-20 LAB — BASIC METABOLIC PANEL
ANION GAP: 8 (ref 5–15)
BUN: 10 mg/dL (ref 6–20)
CALCIUM: 8.4 mg/dL — AB (ref 8.9–10.3)
CO2: 25 mmol/L (ref 22–32)
CREATININE: 1.01 mg/dL — AB (ref 0.44–1.00)
Chloride: 106 mmol/L (ref 101–111)
Glucose, Bld: 103 mg/dL — ABNORMAL HIGH (ref 65–99)
Potassium: 3.7 mmol/L (ref 3.5–5.1)
SODIUM: 139 mmol/L (ref 135–145)

## 2016-12-20 LAB — CBC
HEMATOCRIT: 34.8 % — AB (ref 36.0–46.0)
Hemoglobin: 11.7 g/dL — ABNORMAL LOW (ref 12.0–15.0)
MCH: 30.2 pg (ref 26.0–34.0)
MCHC: 33.6 g/dL (ref 30.0–36.0)
MCV: 89.7 fL (ref 78.0–100.0)
PLATELETS: 205 10*3/uL (ref 150–400)
RBC: 3.88 MIL/uL (ref 3.87–5.11)
RDW: 13.2 % (ref 11.5–15.5)
WBC: 6.2 10*3/uL (ref 4.0–10.5)

## 2016-12-20 LAB — ANTINUCLEAR ANTIBODIES, IFA: ANTINUCLEAR ANTIBODIES, IFA: NEGATIVE

## 2016-12-20 SURGERY — ECHOCARDIOGRAM, TRANSESOPHAGEAL
Anesthesia: Monitor Anesthesia Care

## 2016-12-20 SURGERY — LOOP RECORDER INSERTION
Anesthesia: LOCAL

## 2016-12-20 MED ORDER — LIDOCAINE-EPINEPHRINE 1 %-1:100000 IJ SOLN
INTRAMUSCULAR | Status: DC | PRN
  Administered 2016-12-20: 20 mL

## 2016-12-20 MED ORDER — LIDOCAINE-EPINEPHRINE 1 %-1:100000 IJ SOLN
INTRAMUSCULAR | Status: AC
  Filled 2016-12-20: qty 2

## 2016-12-20 MED ORDER — LACTATED RINGERS IV SOLN
INTRAVENOUS | Status: AC | PRN
  Administered 2016-12-20: 13:00:00 via INTRAVENOUS
  Administered 2016-12-20: 1000 mL via INTRAVENOUS

## 2016-12-20 MED ORDER — PROPOFOL 500 MG/50ML IV EMUL
INTRAVENOUS | Status: DC | PRN
  Administered 2016-12-20: 150 ug/kg/min via INTRAVENOUS

## 2016-12-20 MED ORDER — ACETAMINOPHEN 325 MG PO TABS: 650.0000 mg | ORAL_TABLET | Freq: Four times a day (QID) | ORAL | Status: DC | PRN

## 2016-12-20 MED ORDER — TOPIRAMATE 25 MG PO TABS
50.0000 mg | ORAL_TABLET | Freq: Every day | ORAL | Status: DC
  Administered 2016-12-20: 50 mg via ORAL
  Filled 2016-12-20: qty 2

## 2016-12-20 MED ORDER — ASPIRIN 325 MG PO TBEC: 325.0000 mg | DELAYED_RELEASE_TABLET | Freq: Every day | ORAL | 0 refills | Status: DC

## 2016-12-20 MED ORDER — ATORVASTATIN CALCIUM 80 MG PO TABS: 80.0000 mg | ORAL_TABLET | Freq: Every day | ORAL | 0 refills | Status: DC

## 2016-12-20 MED ORDER — PROPOFOL 10 MG/ML IV BOLUS
INTRAVENOUS | Status: DC | PRN
  Administered 2016-12-20: 25 mg via INTRAVENOUS
  Administered 2016-12-20 (×3): 20 mg via INTRAVENOUS

## 2016-12-20 MED ORDER — LIDOCAINE 2% (20 MG/ML) 5 ML SYRINGE
INTRAMUSCULAR | Status: DC | PRN
  Administered 2016-12-20: 20 mg via INTRAVENOUS

## 2016-12-20 MED ORDER — TOPIRAMATE 50 MG PO TABS: 50.0000 mg | ORAL_TABLET | Freq: Every day | ORAL | 0 refills | Status: DC

## 2016-12-20 SURGICAL SUPPLY — 2 items
LOOP REVEAL LINQSYS (Prosthesis & Implant Heart) ×1 IMPLANT
PACK LOOP INSERTION (CUSTOM PROCEDURE TRAY) ×2 IMPLANT

## 2016-12-20 NOTE — Interval H&P Note (Signed)
History and Physical Interval Alicia:  12/20/2016 12:57 PM  Alicia [name] Alicia [name]  has presented today for surgery, with the diagnosis of STROKE  The various methods of treatment have been discussed with the patient and family. After consideration of risks, benefits and other options for treatment, the patient has consented to  Procedure(s): TRANSESOPHAGEAL ECHOCARDIOGRAM (TEE) (N/A) as a surgical intervention .  The patient's history has been reviewed, patient examined, no change in status, stable for surgery.  I have reviewed the patient's chart and labs.  Questions were answered to the patient's satisfaction.     Tobias AlexanderKatarina [name]

## 2016-12-20 NOTE — Care Management Note (Signed)
Case Management Note  Patient Details  Name: Alicia [name] Note Alicia [name] MRN: [id number] Date of Birth: 1970/03/29  Subjective/Objective:                    Action/Plan: Pt refused CIR. Pt is discharging home with [facility] services. CM met with the patient and provided a list of [facility] agencies. They selected [facility]. Nanine Means is not in network with [facility]. Cory with [facility] notified and able to accept the referral. Pt informed and agreeable to use [facility]. Pt with orders for rolling walker. Jermaine with [facility] D[facility] notified and delivered the equipment to the room.  Husband to provide transportation home.   Expected Discharge Date:  12/20/16               Expected Discharge Plan:  [place]  In-House Referral:     Discharge planning Services  CM Consult  Post Acute Care Choice:  Durable Medical Equipment, Home Health Choice offered to:  Patient, Spouse  DME Arranged:  Walker rolling DME Agency:  [place] Arranged:  PT, OT [facility] Agency:  [place]  Status of Service:  Completed, signed off  If discussed at [place] of Stay Meetings, dates discussed:    Additional Comments:  Pollie Friar, RN 12/20/2016, 4:39 PM

## 2016-12-20 NOTE — Discharge Summary (Addendum)
Physician Discharge Summary  Ashani Pumphrey Note Buser PZW:[id number] DOB: 1970-12-15  PCP: Steele Sizer, MD  Admit date: 12/18/2016 Discharge date: 12/20/2016  Recommendations for Outpatient Follow-up:  1. Dr. Steele Sizer, PCP in 3 days. Please follow results of outstanding hypercoagulable panel and blood culture results that were sent from the hospital. 2. Dr. Antony Contras, Neurology in 6 weeks.  Home Health: PT and OT Equipment/Devices: Rolling walker.  Discharge Condition: Improved and stable  CODE STATUS: Full  Diet recommendation: Heart healthy diet.  Discharge Diagnoses:  Active Problems:   Cerebrovascular accident [facility])   Brief Summary: 46 year old married female with PMH of GERD, status post EGD and esophageal dilatation few weeks ago, HTN, HLD, anxiety and depression presented to ED with acute onset of dizziness, vertigo that started between 9-10 PM on day of admission. This was associated with epistaxis, several episodes of vomiting consisting of blood (possibly swallowed blood), headache. MRI brain confirmed left posterior circulation infarcts, likely embolic. Admitted for acute stroke. Neurology consulted.  Assessment & Plan:   Active Problems:   Acute arterial ischemic stroke, multifocal, posterior circulation (HCC)   Acute left posterior circulation CVA (medial left cerebellum and left occipital lobe) - Resultant dizziness, vertigo, headache, blurred vision and right-sided numbness. - Etiology: Suspect embolic, unclear source. - MRI head: Multifocal acute ischemia within the left posterior circulation including the medial left cerebellum and left occipital lobe. No hemorrhage or mass effect. - CTA head: No emergent large vessel occlusion. Normal major intracranial arteries. Asymmetric attenuated opacification of the mid to distal left pica relative to right - CTA neck: Normal - LDL: 117. - Hemoglobin A1c: 5.5. - 2-D echo: LVEF 55-60 percent. No cardiac  source of emboli identified. - TEE: Normal. No PFO. As discussed with Dr. Leonie Man, no need for lower extremity venous Dopplers. - Hypercoagulable workup: ANA negative. ESR 7. Rest to be followed as outpatient. - Therapies evaluation: PT & OT recommended rehabilitation consultation which was done. Rehabilitation was agreeable to CIR but patient and spouse declined and decided that they wanted to go home with home health therapies. Discussed with case management to arrange home health therapies. - Not on antithrombotic prior to admission. Aspirin 325 MG daily started for secondary stroke prophylaxis. Continue aspirin. Counseled importance of compliance with antithrombotic. - Neurology was consulted and assisted with evaluation and management. As per neurology, patient is interested in participating in the [facility] registry and they will coordinate.  Headache - Patient denies history of chronic headaches. - Better than on admission but still having intermittently. Discussed with Dr. Leonie Man, could be related to posterior circulation stroke or possible migraine. He recommended starting Topamax 50 MG daily at discharge and then gradually increase if needed as outpatient. Discussed extensively with patient/family.  Essential hypertension - Carvedilol was temporarily held to allow permissive hypertension. May resume at discharge.  Hyperlipidemia - LDL 117. Goal <70.  - Lipitor 80 mg started, continue. Consider checking LFTs in a couple of weeks as outpatient.  Glucose intolerance - A1c 5.5.  Epistaxis - Unclear etiology. No acute findings on anterior nasal exam or throat exam. Resolved. May consider outpatient ENT consultation to further evaluate.  Nausea and vomiting - Possibly related to symptomatic acute left posterior circulation strokes. Vomiting of blood was likely related to swallowed epistaxis. No emesis since last night. Diet as tolerated. IV PPI. Monitor closely. - KUB without acute  findings. - Resolved without recurrence.  GERD - As per spouse, recently underwent EGD and what appears to be  dilatation for severe reflux. - Continue prior home dose of PPI at discharge.  Anxiety and depression - Reviewed home medications, not on anything for this. Outpatient evaluation as needed.  Unexplained opiates in urine - No opiates listed on home medications or given in the ED. Patient denies use of opiates recently. Unclear etiology.  Obesity/Body mass index is 31.64 kg/m.  Anemia - Possibly dilutional. Do not suspect that she had that much bleeding from her epistaxis to explain approximately 2 g Hb drop. As per spouse, she had about half a cup of bleeding. Follow CBCs in a couple of days as outpatient. No other source of bleeding reported.   Consultants:  Neurology  Cardiology  Procedures:  TEE Loop recorder placement  Discharge Instructions  Discharge Instructions    Ambulatory referral to Neurology    Complete by:  As directed    An appointment is requested in approximately: 6 weeks Follow up with stroke clinic (Dr Leonie Man preferred, if not available, then consider Cecille Rubin, Dr Terie Purser or Jaynee Eagles whoever is available) at [facility] in about 6-8 weeks. Thanks.   Call MD for:    Complete by:  As directed    Recurrent strokelike symptoms.   Call MD for:  difficulty breathing, headache or visual disturbances    Complete by:  As directed    Call MD for:  extreme fatigue    Complete by:  As directed    Call MD for:  persistant dizziness or light-headedness    Complete by:  As directed    Call MD for:  persistant nausea and vomiting    Complete by:  As directed    Call MD for:  severe uncontrolled pain    Complete by:  As directed    Diet - low sodium heart healthy    Complete by:  As directed    Increase activity slowly    Complete by:  As directed        Medication List    STOP taking these medications   polyethylene glycol powder  powder Commonly known as:  GLYCOLAX/MIRALAX     TAKE these medications   acetaminophen 325 MG tablet Commonly known as:  TYLENOL Take 2 tablets (650 mg total) by mouth every 6 (six) hours as needed for mild pain, moderate pain or headache (or temp > 37.5 C (99.5 F)).   aspirin 325 MG EC tablet Take 1 tablet (325 mg total) by mouth daily.   atorvastatin 80 MG tablet Commonly known as:  LIPITOR Take 1 tablet (80 mg total) by mouth daily at 6 PM.   carvedilol 3.125 MG tablet Commonly known as:  COREG Take 1 tablet (3.125 mg total) by mouth 2 (two) times daily with a meal.   pantoprazole 40 MG tablet Commonly known as:  PROTONIX Take 1 tablet (40 mg total) by mouth daily.   topiramate 50 MG tablet Commonly known as:  TOPAMAX Take 1 tablet (50 mg total) by mouth daily.      Follow-up Information    Garvin Fila, MD. Schedule an appointment as soon as possible for a visit in 6 week(s).   Specialties:  Neurology, Radiology Contact information: [address] [place] [address] [phone]        Steele Sizer, MD. Schedule an appointment as soon as possible for a visit in 3 day(s).   Specialty:  Family Medicine Why:  To be seen with repeat labs (CBC). Contact information: [address] [place] [address] [phone]          Allergies  Allergen Reactions  . Azithromycin Diarrhea  . Peanuts [Peanut Oil] Swelling    "swelling of lips"   . Penicillins Hives and Swelling    Has patient had a PCN reaction causing immediate rash, facial/tongue/throat swelling, SOB or lightheadedness with hypotension: Yes Has patient had a PCN reaction causing severe rash involving mucus membranes or skin necrosis: No Has patient had a PCN reaction that required hospitalization No Has patient had a PCN reaction occurring within the last 10 years: No If all of the above answers are "NO", then may proceed with Cephalosporin use.       Procedures/Studies: Ct Angio Head W Or Wo Contrast  Result Date: 12/19/2016 CLINICAL DATA:  Ataxia.  Acute infarct demonstrated on earlier MRI. EXAM: CT ANGIOGRAPHY HEAD AND NECK TECHNIQUE: Multidetector CT imaging of the head and neck was performed using the standard protocol during bolus administration of intravenous contrast. Multiplanar CT image reconstructions and MIPs were obtained to evaluate the vascular anatomy. Carotid stenosis measurements (when applicable) are obtained utilizing NASCET criteria, using the distal internal carotid diameter as the denominator. CONTRAST:  50 mL Isovue 370 COMPARISON:  Brain MRI 12/19/2016 FINDINGS: CT HEAD FINDINGS Brain: No mass lesion, intraparenchymal hemorrhage or extra-axial collection. No evidence of acute cortical infarct. Brain parenchyma and CSF-containing spaces are normal for age. Skull: Normal visualized skull base, calvarium and extracranial soft tissues. Sinuses/Orbits: No sinus fluid levels or advanced mucosal thickening. No mastoid effusion. Normal orbits. Review of the MIP images confirms the above findings CTA NECK FINDINGS Aortic arch: There is no calcific atherosclerosis of the aortic arch. There is no aneurysm, dissection or hemodynamically significant stenosis of the visualized ascending aorta and aortic arch. Conventional 3 vessel aortic branching pattern. The visualized proximal subclavian arteries are normal. Right carotid system: The right common carotid origin is widely patent. There is no common carotid or internal carotid artery dissection or aneurysm. No hemodynamically significant stenosis. Left carotid system: The left common carotid origin is widely patent. There is no common carotid or internal carotid artery dissection or aneurysm. No hemodynamically significant stenosis. Vertebral arteries: The vertebral system is right dominant. Both vertebral artery origins are normal. Both vertebral arteries are normal to their confluence  with the basilar artery. Skeleton: There is no bony spinal canal stenosis. No lytic or blastic lesions. Other neck: The nasopharynx is clear. The oropharynx and hypopharynx are normal. The epiglottis is normal. The supraglottic larynx, glottis and subglottic larynx are normal. No retropharyngeal collection. The parapharyngeal spaces are preserved. The parotid and submandibular glands are normal. No sialolithiasis or salivary ductal dilatation. The thyroid gland is normal. There is no cervical lymphadenopathy. Upper chest: No pneumothorax or pleural effusion. No nodules or masses. Review of the MIP images confirms the above findings CTA HEAD FINDINGS Anterior circulation: --Intracranial internal carotid arteries: Normal. --Anterior cerebral arteries: Normal. --Middle cerebral arteries: Normal. --Posterior communicating arteries: Absent bilaterally. Posterior circulation: --Posterior cerebral arteries: Normal. --Superior cerebellar arteries: Normal. --Basilar artery: Normal. --Anterior inferior cerebellar arteries: Not visualized, which is not uncommon. --Posterior inferior cerebellar arteries: There is attenuated opacification of the left PICA mid to distal portion. Venous sinuses: As permitted by contrast timing, patent. Anatomic variants: None Delayed phase: No parenchymal contrast enhancement. Review of the MIP images confirms the above findings IMPRESSION: 1. No emergent large vessel occlusion. Normal major intracranial arteries. 2. Asymmetrically attenuated opacification of the mid-to-distal left PICA relative to the right, which is   the artery supplying the vascular territory of the cerebellar infarcts seen on MRI. However, assessment of distal portions of the small posterior fossa arteries is not entirely reliable and this may be only normal variation. 3. Normal CTA of the neck. Electronically Signed   By: Kevin  Herman M.D.   On: 12/19/2016 06:45   Ct Angio Neck W Or Wo Contrast  Result Date:  12/19/2016 CLINICAL DATA:  Ataxia.  Acute infarct demonstrated on earlier MRI. EXAM: CT ANGIOGRAPHY HEAD AND NECK TECHNIQUE: Multidetector CT imaging of the head and neck was performed using the standard protocol during bolus administration of intravenous contrast. Multiplanar CT image reconstructions and MIPs were obtained to evaluate the vascular anatomy. Carotid stenosis measurements (when applicable) are obtained utilizing NASCET criteria, using the distal internal carotid diameter as the denominator. CONTRAST:  50 mL Isovue 370 COMPARISON:  Brain MRI 12/19/2016 FINDINGS: CT HEAD FINDINGS Brain: No mass lesion, intraparenchymal hemorrhage or extra-axial collection. No evidence of acute cortical infarct. Brain parenchyma and CSF-containing spaces are normal for age. Skull: Normal visualized skull base, calvarium and extracranial soft tissues. Sinuses/Orbits: No sinus fluid levels or advanced mucosal thickening. No mastoid effusion. Normal orbits. Review of the MIP images confirms the above findings CTA NECK FINDINGS Aortic arch: There is no calcific atherosclerosis of the aortic arch. There is no aneurysm, dissection or hemodynamically significant stenosis of the visualized ascending aorta and aortic arch. Conventional 3 vessel aortic branching pattern. The visualized proximal subclavian arteries are normal. Right carotid system: The right common carotid origin is widely patent. There is no common carotid or internal carotid artery dissection or aneurysm. No hemodynamically significant stenosis. Left carotid system: The left common carotid origin is widely patent. There is no common carotid or internal carotid artery dissection or aneurysm. No hemodynamically significant stenosis. Vertebral arteries: The vertebral system is right dominant. Both vertebral artery origins are normal. Both vertebral arteries are normal to their confluence with the basilar artery. Skeleton: There is no bony spinal canal stenosis. No  lytic or blastic lesions. Other neck: The nasopharynx is clear. The oropharynx and hypopharynx are normal. The epiglottis is normal. The supraglottic larynx, glottis and subglottic larynx are normal. No retropharyngeal collection. The parapharyngeal spaces are preserved. The parotid and submandibular glands are normal. No sialolithiasis or salivary ductal dilatation. The thyroid gland is normal. There is no cervical lymphadenopathy. Upper chest: No pneumothorax or pleural effusion. No nodules or masses. Review of the MIP images confirms the above findings CTA HEAD FINDINGS Anterior circulation: --Intracranial internal carotid arteries: Normal. --Anterior cerebral arteries: Normal. --Middle cerebral arteries: Normal. --Posterior communicating arteries: Absent bilaterally. Posterior circulation: --Posterior cerebral arteries: Normal. --Superior cerebellar arteries: Normal. --Basilar artery: Normal. --Anterior inferior cerebellar arteries: Not visualized, which is not uncommon. --Posterior inferior cerebellar arteries: There is attenuated opacification of the left PICA mid to distal portion. Venous sinuses: As permitted by contrast timing, patent. Anatomic variants: None Delayed phase: No parenchymal contrast enhancement. Review of the MIP images confirms the above findings IMPRESSION: 1. No emergent large vessel occlusion. Normal major intracranial arteries. 2. Asymmetrically attenuated opacification of the mid-to-distal left PICA relative to the right, which is the artery supplying the vascular territory of the cerebellar infarcts seen on MRI. However, assessment of distal portions of the small posterior fossa arteries is not entirely reliable and this may be only normal variation. 3. Normal CTA of the neck. Electronically Signed   By: Kevin  Herman M.D.   On: 12/19/2016 06:45   Mr Brain Wo Contrast    Result Date: 12/19/2016 CLINICAL DATA:  Ataxia EXAM: MRI HEAD WITHOUT CONTRAST TECHNIQUE: Multiplanar, multiecho  pulse sequences of the brain and surrounding structures were obtained without intravenous contrast. COMPARISON:  Brain MRI 01/20/2016 FINDINGS: Brain: The midline structures are normal. There is diffusion restriction within the medial left cerebellum and is in the left occipital lobe. No mass lesion, hydrocephalus, dural abnormality or extra-axial collection. There is hyperintense T2-weighted signal at the sites of ischemia but otherwise the parenchymal signal of the brain is normal. No age-advanced or lobar predominant atrophy. No chronic microhemorrhage or superficial siderosis. Vascular: Major intracranial arterial and venous sinus flow voids are preserved. Skull and upper cervical spine: The visualized skull base, calvarium, upper cervical spine and extracranial soft tissues are normal. Sinuses/Orbits: No fluid levels or advanced mucosal thickening. No mastoid or middle ear effusion. Normal orbits. IMPRESSION: 1. Multifocal acute ischemia within the left posterior circulation, including the medial left cerebellum and left occipital lobe. No contralateral for anterior circulation ischemia. 2. No hemorrhage or mass effect. Electronically Signed   By: Kevin  Herman M.D.   On: 12/19/2016 03:18   Dg Abd Acute W/chest  Result Date: 12/19/2016 CLINICAL DATA:  46-year-old female with abdominal pain. Recent endoscopy. EXAM: DG ABDOMEN ACUTE W/ 1V CHEST COMPARISON:  Abdominal ultrasound dated 11/13/2012 FINDINGS: The lungs are clear. Minimal bibasilar atelectatic changes/ scarring noted. There is no focal consolidation, pleural effusion, or pneumothorax. The cardiac silhouette is within normal limits. There is moderate stool throughout the colon. No bowel dilatation or evidence of obstruction. No free air or radiopaque calculi. The osseous structures and soft tissues are grossly unremarkable. IMPRESSION: 1. No acute cardiopulmonary process. 2. No bowel obstruction.  No free air. Electronically Signed   By: Arash   Radparvar M.D.   On: 12/19/2016 01:12      Subjective: Patient had some recurrence of headache overnight. No headache this morning. No further nausea, vomiting or epistaxis. Post TEE/loop recorder placement, had some headache again. No dizziness, lightheadedness reported.  Discharge Exam:  Vitals:   12/20/16 1400 12/20/16 1405 12/20/16 1410 12/20/16 1415  BP:  (!) 120/41    Pulse: 90 95 (!) 105 96  Resp: (!) 21 17 (!) 29 (!) 24  Temp:      TempSrc:      SpO2: 99% 98% 97% 96%  Weight:      Height:      Temperature 98.2F.  General: Pt lying comfortably in bed & appears in no obvious distress. She looked much improved compared to when seen in the ED yesterday. Cardiovascular: S1 & S2 heard, RRR, S1/S2 +. No murmurs, rubs, gallops or clicks. No JVD or pedal edema. Telemetry: Sinus rhythm. Respiratory: Clear to auscultation without wheezing, rhonchi or crackles. No increased work of breathing. Abdominal:  Non distended, non tender & soft. No organomegaly or masses appreciated. Normal bowel sounds heard. CNS: Alert and oriented. No focal deficits. (As per neurology, not fully cooperative with exam and findings partially functional, nonfocal exam) Extremities: no edema, no cyanosis Psychiatry: Appeared calm and cooperative this morning without anxiety.    The results of significant diagnostics from this hospitalization (including imaging, microbiology, ancillary and laboratory) are listed below for reference.     Microbiology: Recent Results (from the past 240 hour(s))  Culture, blood (routine x 2)     Status: None (Preliminary result)   Collection Time: 12/19/16  6:40 AM  Result Value Ref Range Status   Specimen Description BLOOD LEFT ARM  Final   Special   Requests   Final    BOTTLES DRAWN AEROBIC AND ANAEROBIC Blood Culture adequate volume   Culture NO GROWTH 1 DAY  Final   Report Status PENDING  Incomplete  Culture, blood (routine x 2)     Status: None (Preliminary result)    Collection Time: 12/19/16  6:50 AM  Result Value Ref Range Status   Specimen Description BLOOD RIGHT HAND  Final   Special Requests   Final    IN PEDIATRIC BOTTLE Blood Culture results may not be optimal due to an excessive volume of blood received in culture bottles   Culture NO GROWTH 1 DAY  Final   Report Status PENDING  Incomplete     Labs: CBC:  Recent Labs Lab 12/19/16 0009 12/20/16 0440  WBC 14.8* 6.2  HGB 13.7 11.7*  HCT 39.6 34.8*  MCV 87.4 89.7  PLT 271 009   Basic Metabolic Panel:  Recent Labs Lab 12/19/16 0009 12/20/16 0440  NA 135 139  K 3.8 3.7  CL 103 106  CO2 22 25  GLUCOSE 126* 103*  BUN 13 10  CREATININE 1.03* 1.01*  CALCIUM 8.9 8.4*   Liver Function Tests:  Recent Labs Lab 12/19/16 0009  AST 19  ALT 16  ALKPHOS 67  BILITOT 0.9  PROT 6.6  ALBUMIN 4.0    Hgb A1c  Recent Labs  12/19/16 0009  HGBA1C 5.5   Lipid Profile  Recent Labs  12/19/16 0009  CHOL 230*  HDL 37*  LDLCALC 117*  TRIG 378*  CHOLHDL 6.2   Urinalysis    Component Value Date/Time   COLORURINE STRAW (A) 12/19/2016 0310   APPEARANCEUR CLEAR 12/19/2016 0310   LABSPEC 1.011 12/19/2016 0310   PHURINE 7.0 12/19/2016 0310   GLUCOSEU NEGATIVE 12/19/2016 0310   HGBUR NEGATIVE 12/19/2016 0310   BILIRUBINUR NEGATIVE 12/19/2016 0310   KETONESUR NEGATIVE 12/19/2016 0310   PROTEINUR NEGATIVE 12/19/2016 0310   NITRITE NEGATIVE 12/19/2016 0310   LEUKOCYTESUR NEGATIVE 12/19/2016 0310    Discussed in detail and multiple times with spouse. Updated care and answered all questions.  Time coordinating discharge: Over 30 minutes  SIGNED:  Vernell Leep, MD, FACP, [place]. [facility] Pager [phone]  If 7PM-7AM, please contact night-coverage [email or web address] Password [facility] 12/20/2016, 4:39 PM

## 2016-12-20 NOTE — H&P (View-Only) (Signed)
STROKE TEAM PROGRESS NOTE   SUBJECTIVE (INTERVAL HISTORY) Her mother and husband are at the bedside.  Patient is laying in bed in NAD Overall she feels her condition is unchanged.  Voices no new complaints. No new events reported overnight.  Patient interviewed, denies any hx of smoking, BCP, clotting disorders or migraines. Admits to hx of miscarriages x 2.   POC reviewed with patient and family at length. Verbalize good understanding of the information given regarding patients status and future testing.  OBJECTIVE No results for input(s): GLUCAP in the last 168 hours.  Recent Labs Lab 12/19/16 0009  NA 135  K 3.8  CL 103  CO2 22  GLUCOSE 126*  BUN 13  CREATININE 1.03*  CALCIUM 8.9    Recent Labs Lab 12/19/16 0009  AST 19  ALT 16  ALKPHOS 67  BILITOT 0.9  PROT 6.6  ALBUMIN 4.0    Recent Labs Lab 12/19/16 0009  WBC 14.8*  HGB 13.7  HCT 39.6  MCV 87.4  PLT 271   No results for input(s): CKTOTAL, CKMB, CKMBINDEX, TROPONINI in the last 168 hours.  Recent Labs  12/19/16 0640  LABPROT 13.3  INR 1.02    Recent Labs  12/19/16 0310  COLORURINE STRAW*  LABSPEC 1.011  PHURINE 7.0  GLUCOSEU NEGATIVE  HGBUR NEGATIVE  BILIRUBINUR NEGATIVE  KETONESUR NEGATIVE  PROTEINUR NEGATIVE  NITRITE NEGATIVE  LEUKOCYTESUR NEGATIVE       Component Value Date/Time   CHOL 230 (H) 12/19/2016 0009   CHOL 242 (H) 04/26/2016 0836   TRIG 378 (H) 12/19/2016 0009   HDL 37 (L) 12/19/2016 0009   HDL 46 04/26/2016 0836   CHOLHDL 6.2 12/19/2016 0009   VLDL 76 (H) 12/19/2016 0009   LDLCALC 117 (H) 12/19/2016 0009   LDLCALC 132 (H) 04/26/2016 0836   Lab Results  Component Value Date   HGBA1C 5.5 12/19/2016      Component Value Date/Time   LABOPIA POSITIVE (A) 12/19/2016 0310   COCAINSCRNUR NONE DETECTED 12/19/2016 0310   LABBENZ POSITIVE (A) 12/19/2016 0310   AMPHETMU NONE DETECTED 12/19/2016 0310   THCU NONE DETECTED 12/19/2016 0310   LABBARB NONE DETECTED  12/19/2016 0310    No results for input(s): ETH in the last 168 hours.  IMAGING: I have personally reviewed the radiological images below and agree with the radiology interpretations.  Ct Angio Head W Or Wo Contrast  Result Date: 12/19/2016 CLINICAL DATA:  Ataxia.  Acute infarct demonstrated on earlier MRI. EXAM: CT ANGIOGRAPHY HEAD AND NECK TECHNIQUE: Multidetector CT imaging of the head and neck was performed using the standard protocol during bolus administration of intravenous contrast. Multiplanar CT image reconstructions and MIPs were obtained to evaluate the vascular anatomy. Carotid stenosis measurements (when applicable) are obtained utilizing NASCET criteria, using the distal internal carotid diameter as the denominator. CONTRAST:  50 mL Isovue 370 COMPARISON:  Brain MRI 12/19/2016 FINDINGS: CT HEAD FINDINGS Brain: No mass lesion, intraparenchymal hemorrhage or extra-axial collection. No evidence of acute cortical infarct. Brain parenchyma and CSF-containing spaces are normal for age. Skull: Normal visualized skull base, calvarium and extracranial soft tissues. Sinuses/Orbits: No sinus fluid levels or advanced mucosal thickening. No mastoid effusion. Normal orbits. Review of the MIP images confirms the above findings CTA NECK FINDINGS Aortic arch: There is no calcific atherosclerosis of the aortic arch. There is no aneurysm, dissection or hemodynamically significant stenosis of the visualized ascending aorta and aortic arch. Conventional 3 vessel aortic branching pattern. The visualized proximal subclavian arteries  are normal. Right carotid system: The right common carotid origin is widely patent. There is no common carotid or internal carotid artery dissection or aneurysm. No hemodynamically significant stenosis. Left carotid system: The left common carotid origin is widely patent. There is no common carotid or internal carotid artery dissection or aneurysm. No hemodynamically significant  stenosis. Vertebral arteries: The vertebral system is right dominant. Both vertebral artery origins are normal. Both vertebral arteries are normal to their confluence with the basilar artery. Skeleton: There is no bony spinal canal stenosis. No lytic or blastic lesions. Other neck: The nasopharynx is clear. The oropharynx and hypopharynx are normal. The epiglottis is normal. The supraglottic larynx, glottis and subglottic larynx are normal. No retropharyngeal collection. The parapharyngeal spaces are preserved. The parotid and submandibular glands are normal. No sialolithiasis or salivary ductal dilatation. The thyroid gland is normal. There is no cervical lymphadenopathy. Upper chest: No pneumothorax or pleural effusion. No nodules or masses. Review of the MIP images confirms the above findings CTA HEAD FINDINGS Anterior circulation: --Intracranial internal carotid arteries: Normal. --Anterior cerebral arteries: Normal. --Middle cerebral arteries: Normal. --Posterior communicating arteries: Absent bilaterally. Posterior circulation: --Posterior cerebral arteries: Normal. --Superior cerebellar arteries: Normal. --Basilar artery: Normal. --Anterior inferior cerebellar arteries: Not visualized, which is not uncommon. --Posterior inferior cerebellar arteries: There is attenuated opacification of the left PICA mid to distal portion. Venous sinuses: As permitted by contrast timing, patent. Anatomic variants: None Delayed phase: No parenchymal contrast enhancement. Review of the MIP images confirms the above findings IMPRESSION: 1. No emergent large vessel occlusion. Normal major intracranial arteries. 2. Asymmetrically attenuated opacification of the mid-to-distal left PICA relative to the right, which is the artery supplying the vascular territory of the cerebellar infarcts seen on MRI. However, assessment of distal portions of the small posterior fossa arteries is not entirely reliable and this may be only normal  variation. 3. Normal CTA of the neck. Electronically Signed   By: Ulyses Jarred M.D.   On: 12/19/2016 06:45   Ct Angio Neck W Or Wo Contrast  Result Date: 12/19/2016 CLINICAL DATA:  Ataxia.  Acute infarct demonstrated on earlier MRI. EXAM: CT ANGIOGRAPHY HEAD AND NECK TECHNIQUE: Multidetector CT imaging of the head and neck was performed using the standard protocol during bolus administration of intravenous contrast. Multiplanar CT image reconstructions and MIPs were obtained to evaluate the vascular anatomy. Carotid stenosis measurements (when applicable) are obtained utilizing NASCET criteria, using the distal internal carotid diameter as the denominator. CONTRAST:  50 mL Isovue 370 COMPARISON:  Brain MRI 12/19/2016 FINDINGS: CT HEAD FINDINGS Brain: No mass lesion, intraparenchymal hemorrhage or extra-axial collection. No evidence of acute cortical infarct. Brain parenchyma and CSF-containing spaces are normal for age. Skull: Normal visualized skull base, calvarium and extracranial soft tissues. Sinuses/Orbits: No sinus fluid levels or advanced mucosal thickening. No mastoid effusion. Normal orbits. Review of the MIP images confirms the above findings CTA NECK FINDINGS Aortic arch: There is no calcific atherosclerosis of the aortic arch. There is no aneurysm, dissection or hemodynamically significant stenosis of the visualized ascending aorta and aortic arch. Conventional 3 vessel aortic branching pattern. The visualized proximal subclavian arteries are normal. Right carotid system: The right common carotid origin is widely patent. There is no common carotid or internal carotid artery dissection or aneurysm. No hemodynamically significant stenosis. Left carotid system: The left common carotid origin is widely patent. There is no common carotid or internal carotid artery dissection or aneurysm. No hemodynamically significant stenosis. Vertebral arteries: The vertebral system  is right dominant. Both vertebral  artery origins are normal. Both vertebral arteries are normal to their confluence with the basilar artery. Skeleton: There is no bony spinal canal stenosis. No lytic or blastic lesions. Other neck: The nasopharynx is clear. The oropharynx and hypopharynx are normal. The epiglottis is normal. The supraglottic larynx, glottis and subglottic larynx are normal. No retropharyngeal collection. The parapharyngeal spaces are preserved. The parotid and submandibular glands are normal. No sialolithiasis or salivary ductal dilatation. The thyroid gland is normal. There is no cervical lymphadenopathy. Upper chest: No pneumothorax or pleural effusion. No nodules or masses. Review of the MIP images confirms the above findings CTA HEAD FINDINGS Anterior circulation: --Intracranial internal carotid arteries: Normal. --Anterior cerebral arteries: Normal. --Middle cerebral arteries: Normal. --Posterior communicating arteries: Absent bilaterally. Posterior circulation: --Posterior cerebral arteries: Normal. --Superior cerebellar arteries: Normal. --Basilar artery: Normal. --Anterior inferior cerebellar arteries: Not visualized, which is not uncommon. --Posterior inferior cerebellar arteries: There is attenuated opacification of the left PICA mid to distal portion. Venous sinuses: As permitted by contrast timing, patent. Anatomic variants: None Delayed phase: No parenchymal contrast enhancement. Review of the MIP images confirms the above findings IMPRESSION: 1. No emergent large vessel occlusion. Normal major intracranial arteries. 2. Asymmetrically attenuated opacification of the mid-to-distal left PICA relative to the right, which is the artery supplying the vascular territory of the cerebellar infarcts seen on MRI. However, assessment of distal portions of the small posterior fossa arteries is not entirely reliable and this may be only normal variation. 3. Normal CTA of the neck. Electronically Signed   By: Ulyses Jarred M.D.   On:  12/19/2016 06:45   Mr Brain Wo Contrast  Result Date: 12/19/2016 CLINICAL DATA:  Ataxia EXAM: MRI HEAD WITHOUT CONTRAST TECHNIQUE: Multiplanar, multiecho pulse sequences of the brain and surrounding structures were obtained without intravenous contrast. COMPARISON:  Brain MRI 01/20/2016 FINDINGS: Brain: The midline structures are normal. There is diffusion restriction within the medial left cerebellum and is in the left occipital lobe. No mass lesion, hydrocephalus, dural abnormality or extra-axial collection. There is hyperintense T2-weighted signal at the sites of ischemia but otherwise the parenchymal signal of the brain is normal. No age-advanced or lobar predominant atrophy. No chronic microhemorrhage or superficial siderosis. Vascular: Major intracranial arterial and venous sinus flow voids are preserved. Skull and upper cervical spine: The visualized skull base, calvarium, upper cervical spine and extracranial soft tissues are normal. Sinuses/Orbits: No fluid levels or advanced mucosal thickening. No mastoid or middle ear effusion. Normal orbits. IMPRESSION: 1. Multifocal acute ischemia within the left posterior circulation, including the medial left cerebellum and left occipital lobe. No contralateral for anterior circulation ischemia. 2. No hemorrhage or mass effect. Electronically Signed   By: Ulyses Jarred M.D.   On: 12/19/2016 03:18   Dg Abd Acute W/chest  Result Date: 12/19/2016 CLINICAL DATA:  46 year old female with abdominal pain. Recent endoscopy. EXAM: DG ABDOMEN ACUTE W/ 1V CHEST COMPARISON:  Abdominal ultrasound dated 11/13/2012 FINDINGS: The lungs are clear. Minimal bibasilar atelectatic changes/ scarring noted. There is no focal consolidation, pleural effusion, or pneumothorax. The cardiac silhouette is within normal limits. There is moderate stool throughout the colon. No bowel dilatation or evidence of obstruction. No free air or radiopaque calculi. The osseous structures and soft  tissues are grossly unremarkable. IMPRESSION: 1. No acute cardiopulmonary process. 2. No bowel obstruction.  No free air. Electronically Signed   By: Anner Crete M.D.   On: 12/19/2016 01:12    PHYSICAL EXAM Temp:  [  98.3 F (36.8 C)-99.1 F (37.3 C)] 98.4 F (36.9 C) (11/01 1300) Pulse Rate:  [94-136] 94 (11/01 1100) Resp:  [14-29] 16 (11/01 1300) BP: (103-149)/(47-118) 119/60 (11/01 1300) SpO2:  [90 %-100 %] 97 % (11/01 1300) Weight:  [83.9 kg (185 lb)-88.9 kg (196 lb)] 88.9 kg (196 lb) (11/01 0931)  General - Well nourished, well developed middle-aged Caucasian lady including the forehead, in no apparent distress Respiratory - Unlabored breathing noted on exam Cardiovascular - Regular rate and rhythm   Neuro: Exam is partially functional,non focal exam, patient is not completely cooperative with testing. Mental Status: Patient is awake, alert, oriented to person, place, month, year, and situation. Patient is able to give a clear and coherent history. No signs of aphasia or neglect Cranial Nerves: II: Visual Fields are full. Pupils are equal, round, and reactive to light.   III,IV, VI: EOMI without ptosis.  She has nystagmus, but keeps her eyes mostly closed which makes assessing this difficult. She is complaining of "blurred vision and a blind spot" V: Facial sensation is symmetric to touch VII: Facial movement is symmetric.  VIII: hearing is intact to voice X: Uvula elevates symmetrically XI: Shoulder shrug is symmetric. XII: tongue is midline without atrophy or fasciculations.  Motor: Tone is normal. Bulk is normal.  She gives poor effort on strength testin, but has no drift in any extremity. Sensory: Sensation is diminished on the right side including the forehead region. Does not appear to be organic, true numbness from the stroke Cerebellar: She does not open eyes or cooperate very well with finger-nose-finger testing, appears relatively symmetric in the arms    ASSESSMENT AND PLAN: [name] is a 46 y.o. female with PMH of anxiety, depression, psychogenic non-epileptic seizures.  Patient presents to the ED with c/o dizziness and blurred vision.  Acute, Small, Left Cerebellar and Left Occipital Infarcts: secondary to embolic source   Resultant:           Patient complains of Blurred vision and Right sided                                               Numbness on exam today   ECHO: Study Conclusions - Left ventricle: The cavity size was normal. Systolic function was   normal. The estimated ejection fraction was in the range of 55%   to 60%. Wall motion was normal; there were no regional wall   motion abnormalities. Impressions: - No cardiac source of emboli was indentified.   TEE:                                    PENDING IN AM   B/L LE Venous U/S:           PENDING    VTE prophylaxis: SCD's   Diet Heart Room service appropriate? Yes; Fluid consistency: Thin  Diet NPO time specified  Diet NPO time specified    No antithrombotic prior to admission, now on aspirin 325 mg daily.   Please discharge patient on  aspirin 325 mg daily  Patient counseled to be compliant with her antithrombotic medications        Therapy recommendations:  PENDING       Disposition:   PENDING  Recommendations:   TEE in AM with Loop Recorder Placement if TEE negative  Checking Labs: Antiphos Antibody, ANA ESR  Checking Venous Dopplers LE's  Ordered ASA 325 mg and Lipitor 80 mg daily  Ongoing aggressive stroke risk factor management        Follow up Appointments:    PCP follow up   Follow up with [facility], Dr Leonie Man in 6 weeks  Diabetes:  HgbA1c  5.5  goal < 7.0  Controlled  Hypertension: Stable Permissive hypertension (OK if <220/120) for 24-48 hours post stroke and then gradually normalized within 5-7 days.  Long term BP goal normotensive. May slowly restart home B/P medications after  48 hours with close PCP follow up.  Hyperlipidemia:  Home meds:  NONE  LDL  117 , goal < 70  Now on  Lipitor to 80 mg daily  Continue statin at discharge  Other Stroke Risk Factors:  ETOH use  Obesity, Body mass index is 31.64 kg/m.   Other Active Problems:  Epistaxis  GERD with N/V  Anxiety/Depression  +UDS  Hospital day # 0  Alicia [name] Stroke Neurology Team 12/19/2016 6:50 PM I have personally examined this patient, reviewed notes, independently viewed imaging studies, participated in medical decision making and plan of care.ROS completed by me personally and pertinent positives fully documented  I have made any additions or clarifications directly to the above note. Agree with note above.  She presented with dizziness, vertigo, blurred vision and left-sided numbness and brain MRI shows tiny left cerebellar and occipital infarcts of embolic etiology.  She has no significant known vascular risk factors except mild obesity, smoking and hyperlipidemia.  Recommend start aspirin and statin.  Check the in the morning and if negative loop recorder placement for cryptogenic stroke.  Physical occupational therapy consult.  Long discussion with the patient at the bedside and answered questions.  Discussed with Dr. Algis Liming.  Greater than 50% time during this 35-minute visit was spent on counseling and coordination of care about her cryptogenic stroke and answering questions.  Antony Contras, MD Medical Director [facility] Pager: [phone] 12/19/2016 7:42 PM  To contact Stroke Continuity provider, please refer to [email or web address] After hours, contact General Neurology

## 2016-12-20 NOTE — Anesthesia Postprocedure Evaluation (Signed)
Anesthesia Post Note  Patient: Alicia [name] [name]  Procedure(s) Performed: TRANSESOPHAGEAL ECHOCARDIOGRAM (TEE) (N/A )     Patient location during evaluation: Endoscopy Anesthesia Type: MAC Level of consciousness: awake and sedated Respiratory status: spontaneous breathing Cardiovascular status: stable Postop Assessment: no apparent nausea or vomiting Anesthetic complications: no    Last Vitals:  Vitals:   12/20/16 1400 12/20/16 1405  BP:  (!) 120/41  Pulse: 90 95  Resp: (!) 21 17  Temp:    SpO2: 99% 98%    Last Pain:  Vitals:   12/20/16 1315  TempSrc: Oral  PainSc:    Pain Goal: Patients Stated Pain Goal: 0 (12/20/16 0650)               [name] JR,JOHN Mateo Flow

## 2016-12-20 NOTE — Consult Note (Signed)
ELECTROPHYSIOLOGY CONSULT NOTE  Patient ID: Alicia [name] Note Pangelinan MRN: [id number], DOB/AGE: [birth year]   Admit date: 12/18/2016 Date of Consult: 12/20/2016  Primary Physician: Alba Cory, MD Primary Cardiologist: Rennis Golden (not seen since 2014) Reason for Consultation: Cryptogenic stroke; recommendations regarding Implantable Loop Recorder  History of Present Illness EP has been asked to evaluate Alicia [name] for placement of an implantable loop recorder to monitor for atrial fibrillation by Dr Pearlean Brownie.  The patient was admitted on 12/18/2016 with dizziness that started suddenly while watching TV. Imaging demonstrated acute small cerebellar and left occipital infarcts felt to be embolic 2/2 unknown source.  She has undergone workup for stroke including echocardiogram and carotid dopplers.  The patient has been monitored on telemetry which has demonstrated sinus rhythm with no arrhythmias.  Inpatient stroke work-up is to be completed with a TEE.   Echocardiogram this admission demonstrated EF 55-60%, no RWMA, LA 29.  Lab work is reviewed.  Prior to admission, the patient denies chest pain, shortness of breath, dizziness, palpitations, or syncope.  They are recovering from their stroke with plans to go to CIR at discharge.  Past Medical History:  Diagnosis Date  . Anemia   . Anxiety   . Depression   . GERD (gastroesophageal reflux disease)   . Hyperlipidemia   . IBS (irritable bowel syndrome)   . Psychogenic nonepileptic seizure    hx/notes 12/19/2016  . Restless leg syndrome   . Stroke (HCC) 12/18/2016   Acute arterial ischemic stroke, multifocal, posterior circulation [name] 12/19/2016     Surgical History:  Past Surgical History:  Procedure Laterality Date  . ABDOMINOPLASTY  Feb. 2015  . CESAREAN SECTION  2011  . COLONOSCOPY WITH PROPOFOL N/A 11/26/2016   Procedure: COLONOSCOPY WITH PROPOFOL;  Surgeon: Wyline Mood, MD;  Location: [facility] ENDOSCOPY;  Service:  Gastroenterology;  Laterality: N/A;  . CYSTOSCOPY  02/02/2015   Procedure: CYSTOSCOPY;  Surgeon: Nadara Mustard, MD;  Location: [facility] ORS;  Service: Gynecology;;  . Joya Gaskins AND CURETTAGE OF UTERUS  2003, 2005, 2008  . ESOPHAGOGASTRODUODENOSCOPY (EGD) WITH PROPOFOL N/A 11/26/2016   Procedure: ESOPHAGOGASTRODUODENOSCOPY (EGD) WITH PROPOFOL;  Surgeon: Wyline Mood, MD;  Location: [facility] ENDOSCOPY;  Service: Gastroenterology;  Laterality: N/A;  . HERNIA REPAIR  1999   Umbilical  . KNEE ARTHROSCOPY Right 2006  . LAPAROSCOPIC BILATERAL SALPINGECTOMY Bilateral 02/02/2015   Procedure: LAPAROSCOPIC BILATERAL SALPINGECTOMY;  Surgeon: Nadara Mustard, MD;  Location: [facility] ORS;  Service: Gynecology;  Laterality: Bilateral;  . LAPAROSCOPIC HYSTERECTOMY N/A 02/02/2015   Procedure: HYSTERECTOMY TOTAL LAPAROSCOPIC;  Surgeon: Nadara Mustard, MD;  Location: [facility] ORS;  Service: Gynecology;  Laterality: N/A;  . REDUCTION MAMMAPLASTY Bilateral December 2015     Prescriptions Prior to Admission  Medication Sig Dispense Refill Last Dose  . carvedilol (COREG) 3.125 MG tablet Take 1 tablet (3.125 mg total) by mouth 2 (two) times daily with a meal. 180 tablet 1 12/18/2016 at 0800  . pantoprazole (PROTONIX) 40 MG tablet Take 1 tablet (40 mg total) by mouth daily. 30 tablet 5 12/18/2016 at Unknown time  . polyethylene glycol powder (GLYCOLAX/MIRALAX) powder 17 grams daily (Patient not taking: Reported on 12/19/2016) 255 g 3 Not Taking at Unknown time    Inpatient Medications:  .  stroke: mapping our early stages of recovery book   Does not apply Once  . acetaminophen  650 mg Oral TID  . aspirin EC  325 mg Oral Daily  . atorvastatin  80 mg Oral q1800  .  pantoprazole (PROTONIX) IV  40 mg Intravenous Q12H    Allergies:  Allergies  Allergen Reactions  . Azithromycin Diarrhea  . Peanuts [Peanut Oil] Swelling    "swelling of lips"   . Penicillins Hives and Swelling    Has patient had a PCN reaction causing immediate  rash, facial/tongue/throat swelling, SOB or lightheadedness with hypotension: Yes Has patient had a PCN reaction causing severe rash involving mucus membranes or skin necrosis: No Has patient had a PCN reaction that required hospitalization No Has patient had a PCN reaction occurring within the last 10 years: No If all of the above answers are "NO", then may proceed with Cephalosporin use.    Social History   Social History  . Marital status: Married    Spouse name: Jonny Ruiz   . Number of children: 4  . 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 Smoking status: Never Smoker  . Smokeless tobacco: Never Used  . Alcohol use No  . Drug use: No  . Sexual activity: Yes    Partners: Male   Other Topics Concern  . Not on file   Social History Narrative   First husband died suddenly in 28-Jun-2014   Remarried 06/07/2016     Family History  Problem Relation Age of Onset  . Hypertension Mother   . Hyperlipidemia Mother   . Heart Problems Father        hole in heart and lower ventricles reversed  . Prostate cancer Maternal Grandfather   . Von Willebrand disease Maternal Uncle       Review of Systems: All other systems reviewed and are otherwise negative except as noted above.  Physical Exam: Vitals:   12/19/16 1300 12/19/16 2122 12/20/16 0125 12/20/16 0440  BP: 119/60 (!) 110/59 (!) 112/58 126/78  Pulse:  93 92 94  Resp: 16 18 18 18   Temp: 98.4 F (36.9 C)  98.2 F (36.8 C) 98.6 F (37 C)  TempSrc: Oral  Oral Oral  SpO2: 97% 96% 98% 99%  Weight:      Height:        GEN- The patient is well appearing, alert and oriented x 3 today.   Head- normocephalic, atraumatic Eyes-  Sclera clear, conjunctiva pink Ears- hearing intact Oropharynx- clear Neck- supple Lungs- Clear to ausculation bilaterally, normal work of breathing Heart- Regular rate and rhythm, no murmurs, rubs or gallops  GI- soft, NT, ND, +  BS Extremities- no clubbing, cyanosis, or edema MS- no significant deformity or atrophy Skin- no rash or lesion Psych- euthymic mood, full affect   Labs:   Lab Results  Component Value Date   WBC 6.2 12/20/2016   HGB 11.7 (L) 12/20/2016   HCT 34.8 (L) 12/20/2016   MCV 89.7 12/20/2016   PLT 205 12/20/2016    Recent Labs Lab 12/19/16 0009 12/20/16 0440  NA 135 139  K 3.8 3.7  CL 103 106  CO2 22 25  BUN 13 10  CREATININE 1.03* 1.01*  CALCIUM 8.9 8.4*  PROT 6.6  --   BILITOT 0.9  --   ALKPHOS 67  --   ALT 16  --   AST 19  --   GLUCOSE 126* 103*     Radiology/Studies: Ct Angio Head W Or Wo Contrast  Result Date: 12/19/2016 CLINICAL DATA:  Ataxia.  Acute infarct demonstrated on earlier MRI. EXAM: CT ANGIOGRAPHY HEAD AND NECK TECHNIQUE: Multidetector CT imaging of  the head and neck was performed using the standard protocol during bolus administration of intravenous contrast. Multiplanar CT image reconstructions and MIPs were obtained to evaluate the vascular anatomy. Carotid stenosis measurements (when applicable) are obtained utilizing NASCET criteria, using the distal internal carotid diameter as the denominator. CONTRAST:  50 mL Isovue 370 COMPARISON:  Brain MRI 12/19/2016 FINDINGS: CT HEAD FINDINGS Brain: No mass lesion, intraparenchymal hemorrhage or extra-axial collection. No evidence of acute cortical infarct. Brain parenchyma and CSF-containing spaces are normal for age. Skull: Normal visualized skull base, calvarium and extracranial soft tissues. Sinuses/Orbits: No sinus fluid levels or advanced mucosal thickening. No mastoid effusion. Normal orbits. Review of the MIP images confirms the above findings CTA NECK FINDINGS Aortic arch: There is no calcific atherosclerosis of the aortic arch. There is no aneurysm, dissection or hemodynamically significant stenosis of the visualized ascending aorta and aortic arch. Conventional 3 vessel aortic branching pattern. The visualized  proximal subclavian arteries are normal. Right carotid system: The right common carotid origin is widely patent. There is no common carotid or internal carotid artery dissection or aneurysm. No hemodynamically significant stenosis. Left carotid system: The left common carotid origin is widely patent. There is no common carotid or internal carotid artery dissection or aneurysm. No hemodynamically significant stenosis. Vertebral arteries: The vertebral system is right dominant. Both vertebral artery origins are normal. Both vertebral arteries are normal to their confluence with the basilar artery. Skeleton: There is no bony spinal canal stenosis. No lytic or blastic lesions. Other neck: The nasopharynx is clear. The oropharynx and hypopharynx are normal. The epiglottis is normal. The supraglottic larynx, glottis and subglottic larynx are normal. No retropharyngeal collection. The parapharyngeal spaces are preserved. The parotid and submandibular glands are normal. No sialolithiasis or salivary ductal dilatation. The thyroid gland is normal. There is no cervical lymphadenopathy. Upper chest: No pneumothorax or pleural effusion. No nodules or masses. Review of the MIP images confirms the above findings CTA HEAD FINDINGS Anterior circulation: --Intracranial internal carotid arteries: Normal. --Anterior cerebral arteries: Normal. --Middle cerebral arteries: Normal. --Posterior communicating arteries: Absent bilaterally. Posterior circulation: --Posterior cerebral arteries: Normal. --Superior cerebellar arteries: Normal. --Basilar artery: Normal. --Anterior inferior cerebellar arteries: Not visualized, which is not uncommon. --Posterior inferior cerebellar arteries: There is attenuated opacification of the left PICA mid to distal portion. Venous sinuses: As permitted by contrast timing, patent. Anatomic variants: None Delayed phase: No parenchymal contrast enhancement. Review of the MIP images confirms the above findings  IMPRESSION: 1. No emergent large vessel occlusion. Normal major intracranial arteries. 2. Asymmetrically attenuated opacification of the mid-to-distal left PICA relative to the right, which is the artery supplying the vascular territory of the cerebellar infarcts seen on MRI. However, assessment of distal portions of the small posterior fossa arteries is not entirely reliable and this may be only normal variation. 3. Normal CTA of the neck. Electronically Signed   By: Deatra Robinson M.D.   On: 12/19/2016 06:45   Ct Angio Neck W Or Wo Contrast  Result Date: 12/19/2016 CLINICAL DATA:  Ataxia.  Acute infarct demonstrated on earlier MRI. EXAM: CT ANGIOGRAPHY HEAD AND NECK TECHNIQUE: Multidetector CT imaging of the head and neck was performed using the standard protocol during bolus administration of intravenous contrast. Multiplanar CT image reconstructions and MIPs were obtained to evaluate the vascular anatomy. Carotid stenosis measurements (when applicable) are obtained utilizing NASCET criteria, using the distal internal carotid diameter as the denominator. CONTRAST:  50 mL Isovue 370 COMPARISON:  Brain MRI 12/19/2016 FINDINGS: CT  HEAD FINDINGS Brain: No mass lesion, intraparenchymal hemorrhage or extra-axial collection. No evidence of acute cortical infarct. Brain parenchyma and CSF-containing spaces are normal for age. Skull: Normal visualized skull base, calvarium and extracranial soft tissues. Sinuses/Orbits: No sinus fluid levels or advanced mucosal thickening. No mastoid effusion. Normal orbits. Review of the MIP images confirms the above findings CTA NECK FINDINGS Aortic arch: There is no calcific atherosclerosis of the aortic arch. There is no aneurysm, dissection or hemodynamically significant stenosis of the visualized ascending aorta and aortic arch. Conventional 3 vessel aortic branching pattern. The visualized proximal subclavian arteries are normal. Right carotid system: The right common carotid  origin is widely patent. There is no common carotid or internal carotid artery dissection or aneurysm. No hemodynamically significant stenosis. Left carotid system: The left common carotid origin is widely patent. There is no common carotid or internal carotid artery dissection or aneurysm. No hemodynamically significant stenosis. Vertebral arteries: The vertebral system is right dominant. Both vertebral artery origins are normal. Both vertebral arteries are normal to their confluence with the basilar artery. Skeleton: There is no bony spinal canal stenosis. No lytic or blastic lesions. Other neck: The nasopharynx is clear. The oropharynx and hypopharynx are normal. The epiglottis is normal. The supraglottic larynx, glottis and subglottic larynx are normal. No retropharyngeal collection. The parapharyngeal spaces are preserved. The parotid and submandibular glands are normal. No sialolithiasis or salivary ductal dilatation. The thyroid gland is normal. There is no cervical lymphadenopathy. Upper chest: No pneumothorax or pleural effusion. No nodules or masses. Review of the MIP images confirms the above findings CTA HEAD FINDINGS Anterior circulation: --Intracranial internal carotid arteries: Normal. --Anterior cerebral arteries: Normal. --Middle cerebral arteries: Normal. --Posterior communicating arteries: Absent bilaterally. Posterior circulation: --Posterior cerebral arteries: Normal. --Superior cerebellar arteries: Normal. --Basilar artery: Normal. --Anterior inferior cerebellar arteries: Not visualized, which is not uncommon. --Posterior inferior cerebellar arteries: There is attenuated opacification of the left PICA mid to distal portion. Venous sinuses: As permitted by contrast timing, patent. Anatomic variants: None Delayed phase: No parenchymal contrast enhancement. Review of the MIP images confirms the above findings IMPRESSION: 1. No emergent large vessel occlusion. Normal major intracranial arteries. 2.  Asymmetrically attenuated opacification of the mid-to-distal left PICA relative to the right, which is the artery supplying the vascular territory of the cerebellar infarcts seen on MRI. However, assessment of distal portions of the small posterior fossa arteries is not entirely reliable and this may be only normal variation. 3. Normal CTA of the neck. Electronically Signed   By: Deatra Robinson M.D.   On: 12/19/2016 06:45   Mr Brain Wo Contrast  Result Date: 12/19/2016 CLINICAL DATA:  Ataxia EXAM: MRI HEAD WITHOUT CONTRAST TECHNIQUE: Multiplanar, multiecho pulse sequences of the brain and surrounding structures were obtained without intravenous contrast. COMPARISON:  Brain MRI 01/20/2016 FINDINGS: Brain: The midline structures are normal. There is diffusion restriction within the medial left cerebellum and is in the left occipital lobe. No mass lesion, hydrocephalus, dural abnormality or extra-axial collection. There is hyperintense T2-weighted signal at the sites of ischemia but otherwise the parenchymal signal of the brain is normal. No age-advanced or lobar predominant atrophy. No chronic microhemorrhage or superficial siderosis. Vascular: Major intracranial arterial and venous sinus flow voids are preserved. Skull and upper cervical spine: The visualized skull base, calvarium, upper cervical spine and extracranial soft tissues are normal. Sinuses/Orbits: No fluid levels or advanced mucosal thickening. No mastoid or middle ear effusion. Normal orbits. IMPRESSION: 1. Multifocal acute ischemia within the  left posterior circulation, including the medial left cerebellum and left occipital lobe. No contralateral for anterior circulation ischemia. 2. No hemorrhage or mass effect. Electronically Signed   By: Deatra RobinsonKevin  Herman M.D.   On: 12/19/2016 03:18   Dg Abd Acute W/chest  Result Date: 12/19/2016 CLINICAL DATA:  46 year old female with abdominal pain. Recent endoscopy. EXAM: DG ABDOMEN ACUTE W/ 1V CHEST  COMPARISON:  Abdominal ultrasound dated 11/13/2012 FINDINGS: The lungs are clear. Minimal bibasilar atelectatic changes/ scarring noted. There is no focal consolidation, pleural effusion, or pneumothorax. The cardiac silhouette is within normal limits. There is moderate stool throughout the colon. No bowel dilatation or evidence of obstruction. No free air or radiopaque calculi. The osseous structures and soft tissues are grossly unremarkable. IMPRESSION: 1. No acute cardiopulmonary process. 2. No bowel obstruction.  No free air. Electronically Signed   By: Elgie CollardArash  Radparvar M.D.   On: 12/19/2016 01:12    12-lead ECG sinus rhythm (personally reviewed) All prior EKG's in EPIC reviewed with no documented atrial fibrillation  Telemetry SR/ST (personally reviewed)  Assessment and Plan:  1. Cryptogenic stroke The patient presents with cryptogenic stroke.  The patient has a TEE planned for this AM.  I spoke at length with the patient about monitoring for afib with an implantable loop recorder.  Risks, benefits, and alteratives to implantable loop recorder were discussed with the patient today.   At this time, the patient is very clear in their decision to proceed with implantable loop recorder.   Wound care was reviewed with the patient (keep incision clean and dry for 3 days).  Wound check scheduled and entered in AVS.  Please call with questions.   Gypsy BalsamAmber Seiler, NP 12/20/2016 12:02 PM  I have seen and examined this patient with Gypsy BalsamAmber Seiler.  Agree with above, note added to reflect my findings.  On exam, RRR, no murmurs, lungs clear. Presented to the hospital with vertigo, found to have cryptogenic stroke. Plan for LINQ implant. TEE negative and no other cause found. Risks and benefits discussed. Risks include but not limited to bleeding and infection. The patient understands the risks and has agreed to the procedure.    Maimuna Leaman M. Viren Lebeau MD 12/20/2016 1:47 PM

## 2016-12-20 NOTE — Progress Notes (Signed)
This RN went over Discharge with Pt. And Family . Pt. And family verbalized understanding. All questions and concerns addressed. Pt. Discharge home and taken down in a wheelchair.

## 2016-12-20 NOTE — Progress Notes (Signed)
STROKE TEAM PROGRESS NOTE   SUBJECTIVE (INTERVAL HISTORY) Her   husband isat the bedside.  Patient examined after  TEE which was normal and loop has been placed. No complaints OBJECTIVE No results for input(s): GLUCAP in the last 168 hours.  Recent Labs Lab 12/19/16 0009 12/20/16 0440  NA 135 139  K 3.8 3.7  CL 103 106  CO2 22 25  GLUCOSE 126* 103*  BUN 13 10  CREATININE 1.03* 1.01*  CALCIUM 8.9 8.4*    Recent Labs Lab 12/19/16 0009  AST 19  ALT 16  ALKPHOS 67  BILITOT 0.9  PROT 6.6  ALBUMIN 4.0    Recent Labs Lab 12/19/16 0009 12/20/16 0440  WBC 14.8* 6.2  HGB 13.7 11.7*  HCT 39.6 34.8*  MCV 87.4 89.7  PLT 271 205   No results for input(s): CKTOTAL, CKMB, CKMBINDEX, TROPONINI in the last 168 hours.  Recent Labs  12/19/16 0640  LABPROT 13.3  INR 1.02    Recent Labs  12/19/16 0310  COLORURINE STRAW*  LABSPEC 1.011  PHURINE 7.0  GLUCOSEU NEGATIVE  HGBUR NEGATIVE  BILIRUBINUR NEGATIVE  KETONESUR NEGATIVE  PROTEINUR NEGATIVE  NITRITE NEGATIVE  LEUKOCYTESUR NEGATIVE       Component Value Date/Time   CHOL 230 (H) 12/19/2016 0009   CHOL 242 (H) 04/26/2016 0836   TRIG 378 (H) 12/19/2016 0009   HDL 37 (L) 12/19/2016 0009   HDL 46 04/26/2016 0836   CHOLHDL 6.2 12/19/2016 0009   VLDL 76 (H) 12/19/2016 0009   LDLCALC 117 (H) 12/19/2016 0009   LDLCALC 132 (H) 04/26/2016 0836   Lab Results  Component Value Date   HGBA1C 5.5 12/19/2016      Component Value Date/Time   LABOPIA POSITIVE (A) 12/19/2016 0310   COCAINSCRNUR NONE DETECTED 12/19/2016 0310   LABBENZ POSITIVE (A) 12/19/2016 0310   AMPHETMU NONE DETECTED 12/19/2016 0310   THCU NONE DETECTED 12/19/2016 0310   LABBARB NONE DETECTED 12/19/2016 0310    No results for input(s): ETH in the last 168 hours.  IMAGING: I have personally reviewed the radiological images below and agree with the radiology interpretations.  Ct Angio Head W Or Wo Contrast  Result Date: 12/19/2016 CLINICAL  DATA:  Ataxia.  Acute infarct demonstrated on earlier MRI. EXAM: CT ANGIOGRAPHY HEAD AND NECK TECHNIQUE: Multidetector CT imaging of the head and neck was performed using the standard protocol during bolus administration of intravenous contrast. Multiplanar CT image reconstructions and MIPs were obtained to evaluate the vascular anatomy. Carotid stenosis measurements (when applicable) are obtained utilizing NASCET criteria, using the distal internal carotid diameter as the denominator. CONTRAST:  50 mL Isovue 370 COMPARISON:  Brain MRI 12/19/2016 FINDINGS: CT HEAD FINDINGS Brain: No mass lesion, intraparenchymal hemorrhage or extra-axial collection. No evidence of acute cortical infarct. Brain parenchyma and CSF-containing spaces are normal for age. Skull: Normal visualized skull base, calvarium and extracranial soft tissues. Sinuses/Orbits: No sinus fluid levels or advanced mucosal thickening. No mastoid effusion. Normal orbits. Review of the MIP images confirms the above findings CTA NECK FINDINGS Aortic arch: There is no calcific atherosclerosis of the aortic arch. There is no aneurysm, dissection or hemodynamically significant stenosis of the visualized ascending aorta and aortic arch. Conventional 3 vessel aortic branching pattern. The visualized proximal subclavian arteries are normal. Right carotid system: The right common carotid origin is widely patent. There is no common carotid or internal carotid artery dissection or aneurysm. No hemodynamically significant stenosis. Left carotid system: The left common carotid origin  is widely patent. There is no common carotid or internal carotid artery dissection or aneurysm. No hemodynamically significant stenosis. Vertebral arteries: The vertebral system is right dominant. Both vertebral artery origins are normal. Both vertebral arteries are normal to their confluence with the basilar artery. Skeleton: There is no bony spinal canal stenosis. No lytic or blastic  lesions. Other neck: The nasopharynx is clear. The oropharynx and hypopharynx are normal. The epiglottis is normal. The supraglottic larynx, glottis and subglottic larynx are normal. No retropharyngeal collection. The parapharyngeal spaces are preserved. The parotid and submandibular glands are normal. No sialolithiasis or salivary ductal dilatation. The thyroid gland is normal. There is no cervical lymphadenopathy. Upper chest: No pneumothorax or pleural effusion. No nodules or masses. Review of the MIP images confirms the above findings CTA HEAD FINDINGS Anterior circulation: --Intracranial internal carotid arteries: Normal. --Anterior cerebral arteries: Normal. --Middle cerebral arteries: Normal. --Posterior communicating arteries: Absent bilaterally. Posterior circulation: --Posterior cerebral arteries: Normal. --Superior cerebellar arteries: Normal. --Basilar artery: Normal. --Anterior inferior cerebellar arteries: Not visualized, which is not uncommon. --Posterior inferior cerebellar arteries: There is attenuated opacification of the left PICA mid to distal portion. Venous sinuses: As permitted by contrast timing, patent. Anatomic variants: None Delayed phase: No parenchymal contrast enhancement. Review of the MIP images confirms the above findings IMPRESSION: 1. No emergent large vessel occlusion. Normal major intracranial arteries. 2. Asymmetrically attenuated opacification of the mid-to-distal left PICA relative to the right, which is the artery supplying the vascular territory of the cerebellar infarcts seen on MRI. However, assessment of distal portions of the small posterior fossa arteries is not entirely reliable and this may be only normal variation. 3. Normal CTA of the neck. Electronically Signed   By: Ulyses Jarred M.D.   On: 12/19/2016 06:45   Ct Angio Neck W Or Wo Contrast  Result Date: 12/19/2016 CLINICAL DATA:  Ataxia.  Acute infarct demonstrated on earlier MRI. EXAM: CT ANGIOGRAPHY HEAD AND  NECK TECHNIQUE: Multidetector CT imaging of the head and neck was performed using the standard protocol during bolus administration of intravenous contrast. Multiplanar CT image reconstructions and MIPs were obtained to evaluate the vascular anatomy. Carotid stenosis measurements (when applicable) are obtained utilizing NASCET criteria, using the distal internal carotid diameter as the denominator. CONTRAST:  50 mL Isovue 370 COMPARISON:  Brain MRI 12/19/2016 FINDINGS: CT HEAD FINDINGS Brain: No mass lesion, intraparenchymal hemorrhage or extra-axial collection. No evidence of acute cortical infarct. Brain parenchyma and CSF-containing spaces are normal for age. Skull: Normal visualized skull base, calvarium and extracranial soft tissues. Sinuses/Orbits: No sinus fluid levels or advanced mucosal thickening. No mastoid effusion. Normal orbits. Review of the MIP images confirms the above findings CTA NECK FINDINGS Aortic arch: There is no calcific atherosclerosis of the aortic arch. There is no aneurysm, dissection or hemodynamically significant stenosis of the visualized ascending aorta and aortic arch. Conventional 3 vessel aortic branching pattern. The visualized proximal subclavian arteries are normal. Right carotid system: The right common carotid origin is widely patent. There is no common carotid or internal carotid artery dissection or aneurysm. No hemodynamically significant stenosis. Left carotid system: The left common carotid origin is widely patent. There is no common carotid or internal carotid artery dissection or aneurysm. No hemodynamically significant stenosis. Vertebral arteries: The vertebral system is right dominant. Both vertebral artery origins are normal. Both vertebral arteries are normal to their confluence with the basilar artery. Skeleton: There is no bony spinal canal stenosis. No lytic or blastic lesions. Other neck: The  nasopharynx is clear. The oropharynx and hypopharynx are normal. The  epiglottis is normal. The supraglottic larynx, glottis and subglottic larynx are normal. No retropharyngeal collection. The parapharyngeal spaces are preserved. The parotid and submandibular glands are normal. No sialolithiasis or salivary ductal dilatation. The thyroid gland is normal. There is no cervical lymphadenopathy. Upper chest: No pneumothorax or pleural effusion. No nodules or masses. Review of the MIP images confirms the above findings CTA HEAD FINDINGS Anterior circulation: --Intracranial internal carotid arteries: Normal. --Anterior cerebral arteries: Normal. --Middle cerebral arteries: Normal. --Posterior communicating arteries: Absent bilaterally. Posterior circulation: --Posterior cerebral arteries: Normal. --Superior cerebellar arteries: Normal. --Basilar artery: Normal. --Anterior inferior cerebellar arteries: Not visualized, which is not uncommon. --Posterior inferior cerebellar arteries: There is attenuated opacification of the left PICA mid to distal portion. Venous sinuses: As permitted by contrast timing, patent. Anatomic variants: None Delayed phase: No parenchymal contrast enhancement. Review of the MIP images confirms the above findings IMPRESSION: 1. No emergent large vessel occlusion. Normal major intracranial arteries. 2. Asymmetrically attenuated opacification of the mid-to-distal left PICA relative to the right, which is the artery supplying the vascular territory of the cerebellar infarcts seen on MRI. However, assessment of distal portions of the small posterior fossa arteries is not entirely reliable and this may be only normal variation. 3. Normal CTA of the neck. Electronically Signed   By: Ulyses Jarred M.D.   On: 12/19/2016 06:45   Mr Brain Wo Contrast  Result Date: 12/19/2016 CLINICAL DATA:  Ataxia EXAM: MRI HEAD WITHOUT CONTRAST TECHNIQUE: Multiplanar, multiecho pulse sequences of the brain and surrounding structures were obtained without intravenous contrast. COMPARISON:   Brain MRI 01/20/2016 FINDINGS: Brain: The midline structures are normal. There is diffusion restriction within the medial left cerebellum and is in the left occipital lobe. No mass lesion, hydrocephalus, dural abnormality or extra-axial collection. There is hyperintense T2-weighted signal at the sites of ischemia but otherwise the parenchymal signal of the brain is normal. No age-advanced or lobar predominant atrophy. No chronic microhemorrhage or superficial siderosis. Vascular: Major intracranial arterial and venous sinus flow voids are preserved. Skull and upper cervical spine: The visualized skull base, calvarium, upper cervical spine and extracranial soft tissues are normal. Sinuses/Orbits: No fluid levels or advanced mucosal thickening. No mastoid or middle ear effusion. Normal orbits. IMPRESSION: 1. Multifocal acute ischemia within the left posterior circulation, including the medial left cerebellum and left occipital lobe. No contralateral for anterior circulation ischemia. 2. No hemorrhage or mass effect. Electronically Signed   By: Ulyses Jarred M.D.   On: 12/19/2016 03:18   Dg Abd Acute W/chest  Result Date: 12/19/2016 CLINICAL DATA:  46 year old female with abdominal pain. Recent endoscopy. EXAM: DG ABDOMEN ACUTE W/ 1V CHEST COMPARISON:  Abdominal ultrasound dated 11/13/2012 FINDINGS: The lungs are clear. Minimal bibasilar atelectatic changes/ scarring noted. There is no focal consolidation, pleural effusion, or pneumothorax. The cardiac silhouette is within normal limits. There is moderate stool throughout the colon. No bowel dilatation or evidence of obstruction. No free air or radiopaque calculi. The osseous structures and soft tissues are grossly unremarkable. IMPRESSION: 1. No acute cardiopulmonary process. 2. No bowel obstruction.  No free air. Electronically Signed   By: Anner Crete M.D.   On: 12/19/2016 01:12    PHYSICAL EXAM Temp:  [98.2 F (36.8 C)-99 F (37.2 C)] 98.2 F (36.8  C) (11/02 1315) Pulse Rate:  [80-105] 96 (11/02 1415) Resp:  [17-29] 24 (11/02 1415) BP: (110-136)/(41-78) 120/41 (11/02 1405) SpO2:  [96 %-100 %]  96 % (11/02 1415) Weight:  [196 lb (88.9 kg)] 196 lb (88.9 kg) (11/02 1210)  General - Well nourished, well developed middle-aged Caucasian lady including the forehead, in no apparent distress Respiratory - Unlabored breathing noted on exam Cardiovascular - Regular rate and rhythm   Neuro: Exam is partially functional,non focal exam, patient is not completely cooperative with testing. Mental Status: Patient is awake, alert, oriented to person, place, month, year, and situation. Patient is able to give a clear and coherent history. No signs of aphasia or neglect Cranial Nerves: II: Visual Fields are full. Pupils are equal, round, and reactive to light.   III,IV, VI: EOMI without ptosis.  She has nystagmus, but keeps her eyes mostly closed which makes assessing this difficult. She is complaining of "blurred vision and a blind spot" V: Facial sensation is symmetric to touch VII: Facial movement is symmetric.  VIII: hearing is intact to voice X: Uvula elevates symmetrically XI: Shoulder shrug is symmetric. XII: tongue is midline without atrophy or fasciculations.  Motor: Tone is normal. Bulk is normal.  She gives poor effort on strength testin, but has no drift in any extremity. Sensory: Sensation is diminished on the right side including the forehead region. Does not appear to be organic, true numbness from the stroke Cerebellar: She does not open eyes or cooperate very well with finger-nose-finger testing, appears relatively symmetric in the arms   ASSESSMENT AND PLAN: Alicia [name] is a 46 y.o. female with PMH of anxiety, depression, psychogenic non-epileptic seizures.  Patient presents to the ED with c/o dizziness and blurred vision.  Acute, Small, Left Cerebellar and Left Occipital Infarcts: secondary to embolic  source   Resultant:           Patient complains of Blurred vision and Right sided                                               Numbness on exam today   ECHO: Study Conclusions - Left ventricle: The cavity size was normal. Systolic function was   normal. The estimated ejection fraction was in the range of 55%   to 60%. Wall motion was normal; there were no regional wall   motion abnormalities. Impressions: - No cardiac source of emboli was indentified.  TEE:                                    normal  B/L LE Venous U/S:           PENDING  ANA negative ESR 7 mm  VTE prophylaxis: SCD's        No antithrombotic prior to admission, now on aspirin 325 mg daily.   Please discharge patient on  aspirin 325 mg daily  Patient counseled to be compliant with her antithrombotic medications        Therapy recommendations:  CLR       Disposition:   CLR       Recommendations:      Checking Labs: Antiphos Antibody, ANA ESR  Checking Venous Dopplers LE's  Ordered ASA 325 mg and Lipitor 80 mg daily  Ongoing aggressive stroke risk factor management        Follow up Appointments:    PCP follow up   Follow up with  [facility], Dr Leonie Man in 6 weeks  Diabetes:  HgbA1c  5.5  goal < 7.0  Controlled  Hypertension: Stable Permissive hypertension (OK if <220/120) for 24-48 hours post stroke and then gradually normalized within 5-7 days.  Long term BP goal normotensive. May slowly restart home B/P medications after 48 hours with close PCP follow up.  Hyperlipidemia:  Home meds:  NONE  LDL  117 , goal < 70  Now on  Lipitor to 80 mg daily  Continue statin at discharge  Other Stroke Risk Factors:  ETOH use  Obesity, Body mass index is 31.64 kg/m.   Other Active Problems:  Epistaxis  GERD with N/V  Anxiety/Depression  +UDS  Hospital day # 1  Renie Ora Stroke Neurology Team 12/20/2016 3:07 PM  Continue aspirin for stroke  prevention and aggressive risk factor modification. Telemetry monitoring for 48 hours has not shown definite evidence of atrial fibrillation. Follow-up as an outpatient in stroke clinic in 6 weeks. Patient interested in participating in the [facility] and will be contacted for the same. Stroke team will sign off. Kindly call for questions. Discussed with the patient, husband and Dr. Girtha Hake, [place] Pager: [phone] 12/20/2016 3:07 PM  To contact Stroke Continuity provider, please refer to [email or web address] After hours, contact General Neurology

## 2016-12-20 NOTE — Progress Notes (Signed)
  Echocardiogram Echocardiogram Transesophageal has been performed.  Leta JunglingCooper, Grasiela Jonsson M 12/20/2016, 1:27 PM

## 2016-12-20 NOTE — Transfer of Care (Signed)
Immediate Anesthesia Transfer of Care Note  Patient: Alicia [name] [name]  Procedure(s) Performed: TRANSESOPHAGEAL ECHOCARDIOGRAM (TEE) (N/A )  Patient Location: Endoscopy Unit  Anesthesia Type:MAC  Level of Consciousness: drowsy and patient cooperative  Airway & Oxygen Therapy: Patient Spontanous Breathing and Patient connected to nasal cannula oxygen  Post-op Assessment: Report given to RN and Post -op Vital signs reviewed and stable  Post vital signs: Reviewed and stable  Last Vitals:  Vitals:   12/20/16 1210 12/20/16 1314  BP: 136/61   Pulse:  90  Resp: (!) 28 (!) 21  Temp: 37.2 C   SpO2: 100% 98%    Last Pain:  Vitals:   12/20/16 1210  TempSrc: Oral  PainSc:       Patients Stated Pain Goal: 0 (70/35/00 9381)  Complications: No apparent anesthesia complications

## 2016-12-20 NOTE — Discharge Instructions (Signed)

## 2016-12-20 NOTE — Progress Notes (Signed)
OT Cancellation Note  Patient Details Name: Elta GuadeloupeDorothy Van Note [name] MRN: [id number] DOB: 1971/01/13   Cancelled Treatment:    Reason Eval/Treat Not Completed: Patient at procedure or test/ unavailable  Gaye AlkenBailey A Sherisa Gilvin M.S., OTR/L  Pager: [phone]  12/20/2016, 12:12 PM

## 2016-12-20 NOTE — Progress Notes (Signed)
Rehab admissions - I met with patient and gave her rehab booklets.  Awaiting OT evaluation.  I have opened the case with [employer] requesting acute inpatient rehab admission.  Noted plans for TEE and Loop today.  I will update all once I hear back from insurance carrier.  Call me for questions.  #[phone]

## 2016-12-20 NOTE — CV Procedure (Signed)
     Transesophageal Echocardiogram [name]  Alicia [name] Alicia [name] [id number] 14-Apr-1970  Procedure: Transesophageal Echocardiogram Indications: Stroke  Procedure Details Consent: Obtained Time Out: Verified patient identification, verified procedure, site/side was marked, verified correct patient position, special equipment/implants available, Radiology Safety Procedures followed,  medications/allergies/relevent history reviewed, required imaging and test results available.  Performed  Medications: IV propofol and lidocaine administered by anesthesia staff for sedation.   Negative bubble study, no PFO. No thrombus in the left atrial appendage.   Complications: No apparent complications Patient did tolerate procedure well.  Alicia AlexanderKatarina Caroline Longie, MD, [facility] 12/20/2016, 2:04 PM

## 2016-12-20 NOTE — Progress Notes (Signed)
Physical Therapy Treatment Patient Details Name: Alicia [name] Note Alicia [name] MRN: [id number] DOB: 1970/07/13 Today's Date: 12/20/2016    History of Present Illness Pt is a 46 y/o female admitted following sudden onset dizziness. Imaging revealed acute infart in L cerebellar and L occipital lobe. PMH includes GERD, depression, anxiety, restless leg syndrome, seizures, R arthroscopic knee surgery.     PT Comments    Pt is progressing towards her goals, however mobility continues to be effected by dizziness, impaired balance and impaired coordination. Although, pt requires minA for mobility due to good strength and her young age, pt was working full time and independent PTA. Currently, pt requires maximal verbal and tactile cuing for opened eyes, upright head and gaze stabilization techniques as remediation techniques for her impaired level of balance and coordination secondary to cerebellar infarct. Pt is appropriate for acute rehab to continue to focus on neuro-reeducation, as well as balance and mobility training to return to her prior level of function working full time and caring for a family.   Follow Up Recommendations  CIR;Supervision/Assistance - 24 hour     Equipment Recommendations  3in1 (PT);Rolling walker with 5" wheels    Recommendations for Other Services       Precautions / Restrictions Precautions Precautions: Fall Restrictions Weight Bearing Restrictions: No    Mobility  Bed Mobility Overal bed mobility: Needs Assistance Bed Mobility: Supine to Sit;Sit to Supine     Supine to sit: Min guard     General bed mobility comments: minA for trunk to upright, L lateral lean in sitting with verbal cues for midline orientation able to come to upright but states when in midline "it feels like the world is tilted"  Transfers Overall transfer level: Needs assistance Equipment used: Rolling walker (2 wheeled) Transfers: Sit to/from Stand Sit to Stand: Min assist;+2  safety/equipment Stand pivot transfers: Min assist;+2 safety/equipment       General transfer comment: good power up to standing, requires minAx2 for steadying in RW for sit<>stand and stand pivot , pt prefers flexed cervical spine and eyes closed with mobility, vc for opened eyes, cervical extension and gaze stabilization to decrease dizziness  Ambulation/Gait Ambulation/Gait assistance: Min assist;+2 safety/equipment Ambulation Distance (Feet): 20 Feet Assistive device: Rolling walker (2 wheeled) Gait Pattern/deviations: Step-through pattern;Decreased step length - right;Decreased step length - left;Shuffle;Trunk flexed;Decreased weight shift to right (L lateral lean) Gait velocity: slowed Gait velocity interpretation: Below normal speed for age/gender General Gait Details: minAx2 for steadying gait with RW, pt able to walk to bathroom with maximal verbal cuing for upright posture, open eyes and gaze stabilization techniques, pt reports dizziness with ambulation       Modified Rankin (Stroke Patients Only) Modified Rankin (Stroke Patients Only) Pre-Morbid Rankin Score: No symptoms Modified Rankin: Moderately severe disability     Balance Overall balance assessment: Needs assistance Sitting-balance support: No upper extremity supported;Feet supported Sitting balance-Leahy Scale: Poor Sitting balance - Comments: L Lateral lean, pt feels off center when oriented to midline   Standing balance support: During functional activity;Single extremity supported Standing balance-Leahy Scale: Poor Standing balance comment: Reliant on UE and external assist for balance.                             Cognition Arousal/Alertness: Awake/alert Behavior During Therapy: Anxious Overall Cognitive Status: Impaired/Different from baseline Area of Impairment: Problem solving  Problem Solving: Slow processing General Comments: Pt continues to keep  eyes closed with most activity, occasionally requires extra time for processing          General Comments General comments (skin integrity, edema, etc.): Pt husband and mother present in room during session. Pt coordination continues to be impaired with L finger to nose assessment, and ataxic movement of L LE with gait.        Pertinent Vitals/Pain Pain Assessment: Faces Faces Pain Scale: Hurts a little bit Pain Location: headache  Pain Descriptors / Indicators: Dull Pain Intervention(s): Monitored during session;Limited activity within patient's tolerance           PT Goals (current goals can now be found in the care plan section) Acute Rehab PT Goals Patient Stated Goal: to get rid of dizziness  PT Goal Formulation: With patient/family Time For Goal Achievement: 01/02/17 Potential to Achieve Goals: Good Progress towards PT goals: Progressing toward goals    Frequency    Min 4X/week      PT Plan Current plan remains appropriate       AM-PAC PT "6 Clicks" Daily Activity  Outcome Measure  Difficulty turning over in bed (including adjusting bedclothes, sheets and blankets)?: Unable Difficulty moving from lying on back to sitting on the side of the bed? : Unable Difficulty sitting down on and standing up from a chair with arms (e.g., wheelchair, bedside commode, etc,.)?: Unable Help needed moving to and from a bed to chair (including a wheelchair)?: A Little Help needed walking in hospital room?: A Little Help needed climbing 3-5 steps with a railing? : Total 6 Click Score: 10    End of Session Equipment Utilized During Treatment: Gait belt Activity Tolerance: Patient tolerated treatment well Patient left: with call bell/phone within reach;with family/visitor present;in chair;with chair alarm set Nurse Communication: Mobility status PT Visit Diagnosis: Unsteadiness on feet (R26.81);Other symptoms and signs involving the nervous system (R29.898)     Time:  1610-9604 PT Time Calculation (min) (ACUTE ONLY): 47 min  Charges:  $Gait Training: 8-22 mins $Therapeutic Activity: 23-37 mins                    G Codes:       Alicia [name] PT, DPT Acute Rehabilitation  [phone] Pager [phone]     Alicia [name] 12/20/2016, 11:38 AM

## 2016-12-20 NOTE — Anesthesia Preprocedure Evaluation (Signed)
Anesthesia Evaluation  Patient identified by MRN, date of birth, ID band Patient awake    Reviewed: Allergy & Precautions, NPO status , Patient's Chart, lab work & pertinent test results, reviewed documented beta blocker date and time   Airway Mallampati: II       Dental no notable dental hx. (+) Teeth Intact   Pulmonary neg pulmonary ROS,    Pulmonary exam normal breath sounds clear to auscultation       Cardiovascular Normal cardiovascular exam Rhythm:Regular Rate:Normal     Neuro/Psych    GI/Hepatic GERD  Medicated and Controlled,  Endo/Other    Renal/GU      Musculoskeletal   Abdominal (+) + obese,   Peds  Hematology  (+) anemia ,   Anesthesia Other Findings   Reproductive/Obstetrics                             Anesthesia Physical Anesthesia Plan  ASA: II  Anesthesia Plan: MAC   Post-op Pain Management:    Induction: Intravenous  PONV Risk Score and Plan: 2 and Ondansetron, Dexamethasone and Treatment may vary due to age or medical condition  Airway Management Planned: Mask and Natural Airway  Additional Equipment:   Intra-op Plan:   Post-operative Plan:   Informed Consent: I have reviewed the patients History and Physical, chart, labs and discussed the procedure including the risks, benefits and alternatives for the proposed anesthesia with the patient or authorized representative who has indicated his/her understanding and acceptance.     Plan Discussed with: CRNA and Surgeon  Anesthesia Plan Comments:         Anesthesia Quick Evaluation

## 2016-12-20 NOTE — Consult Note (Signed)
Physical Medicine and Rehabilitation Consult   Reason for Consult: Cerebellar stroke with dizziness and N/V Referring Physician:  Dr. Waymon Amato   HPI: Alicia [name] is a 46 y.o. female with history of anxiety/depression, tachycardia, IBS-C psychogenic pseudoseizures who was admitted on 12/19/16 with nausea/vomiting, syncopal episodes and dizziness. Patient reported recent endoscopy for dilatation?and with panic attack in ED.   MRI brain done revealing multifocal acute ischemia L-PCA including left cerebellum and left occipital lobe.   CTA head/neck done revealing no emergent large vessel occlusion and normal CTA of neck. 2 D echo with EF 55-60% and no wall abnormality. Work up underway with TEE with loop recorder planned for today. Dr. Pearlean Brownie recommended ASA as well as coagulopathy work up to determine stroke etiology.    Therapy evaluations done revealing guarded movements due to dizziness and Cognistat testing WFL. She was able to walk to BR this am with NA and husband with rest breaks.  CIR recommended for follow up therapy.    Review of Systems  HENT: Positive for tinnitus (buzzing on and off). Negative for hearing loss.   Eyes: Positive for photophobia. Negative for blurred vision and double vision.  Respiratory: Positive for shortness of breath. Negative for cough.   Cardiovascular: Positive for chest pain (chronic due to tachycardia and hypotension) and palpitations (tachycardia ).  Gastrointestinal: Positive for constipation, nausea and vomiting.  Genitourinary: Negative for frequency and urgency.  Musculoskeletal: Negative for back pain, myalgias and neck pain.  Neurological: Positive for dizziness and headaches. Negative for sensory change and speech change.  Psychiatric/Behavioral: The patient is nervous/anxious.       Past Medical History:  Diagnosis Date  . Anemia   . Anxiety   . Depression   . GERD (gastroesophageal reflux disease)   . Hyperlipidemia   .  IBS (irritable bowel syndrome)   . Psychogenic nonepileptic seizure    hx/notes 12/19/2016  . Restless leg syndrome   . Stroke (HCC) 12/18/2016   Acute arterial ischemic stroke, multifocal, posterior circulation Alicia [name] 12/19/2016    Past Surgical History:  Procedure Laterality Date  . ABDOMINOPLASTY  Feb. 2015  . CESAREAN SECTION  2011  . COLONOSCOPY WITH PROPOFOL N/A 11/26/2016   Procedure: COLONOSCOPY WITH PROPOFOL;  Surgeon: Alicia Mood, MD;  Location: [facility] ENDOSCOPY;  Service: Gastroenterology;  Laterality: N/A;  . CYSTOSCOPY  02/02/2015   Procedure: CYSTOSCOPY;  Surgeon: Nadara Mustard, MD;  Location: [facility] ORS;  Service: Gynecology;;  . Alicia [name] AND CURETTAGE OF UTERUS  2003, 2005, 2008  . ESOPHAGOGASTRODUODENOSCOPY (EGD) WITH PROPOFOL N/A 11/26/2016   Procedure: ESOPHAGOGASTRODUODENOSCOPY (EGD) WITH PROPOFOL;  Surgeon: Alicia Mood, MD;  Location: [facility] ENDOSCOPY;  Service: Gastroenterology;  Laterality: N/A;  . HERNIA REPAIR  1999   Umbilical  . KNEE ARTHROSCOPY Right 2006  . LAPAROSCOPIC BILATERAL SALPINGECTOMY Bilateral 02/02/2015   Procedure: LAPAROSCOPIC BILATERAL SALPINGECTOMY;  Surgeon: Nadara Mustard, MD;  Location: [facility] ORS;  Service: Gynecology;  Laterality: Bilateral;  . LAPAROSCOPIC HYSTERECTOMY N/A 02/02/2015   Procedure: HYSTERECTOMY TOTAL LAPAROSCOPIC;  Surgeon: Nadara Mustard, MD;  Location: [facility] ORS;  Service: Gynecology;  Laterality: N/A;  . REDUCTION MAMMAPLASTY Bilateral December 2015    Family History  Problem Relation Age of Onset  . Hypertension Mother   . Hyperlipidemia Mother   . Heart Problems Father        hole in heart and lower ventricles reversed  . Prostate cancer Maternal Grandfather   . Von Willebrand disease Maternal Uncle  Social History:  Married. Works as an Airline pilot for [employer]. Independent PTA. Husband and mother supportive and can assist after discharge. She reports that she has never smoked. She has never used smokeless tobacco.  She reports that she does not drink alcohol or use drugs.    Allergies  Allergen Reactions  . Azithromycin Diarrhea  . Peanuts [Peanut Oil] Swelling    "swelling of lips"   . Penicillins Hives and Swelling    Has patient had a PCN reaction causing immediate rash, facial/tongue/throat swelling, SOB or lightheadedness with hypotension: Yes Has patient had a PCN reaction causing severe rash involving mucus membranes or skin necrosis: No Has patient had a PCN reaction that required hospitalization No Has patient had a PCN reaction occurring within the last 10 years: No If all of the above answers are "NO", then may proceed with Cephalosporin use.    Medications Prior to Admission  Medication Sig Dispense Refill  . carvedilol (COREG) 3.125 MG tablet Take 1 tablet (3.125 mg total) by mouth 2 (two) times daily with a meal. 180 tablet 1  . pantoprazole (PROTONIX) 40 MG tablet Take 1 tablet (40 mg total) by mouth daily. 30 tablet 5  . polyethylene glycol powder (GLYCOLAX/MIRALAX) powder 17 grams daily (Patient not taking: Reported on 12/19/2016) 255 g 3    Home: Home Living Family/patient expects to be discharged to:: Private residence Living Arrangements: Spouse/significant other, Children Available Help at Discharge: Family, Available 24 hours/day Type of Home: House Home Access: Stairs to enter Secretary/administrator of Steps: 6 Entrance Stairs-Rails: Right Home Layout: One level Bathroom Shower/Tub: Engineer, manufacturing systems: Standard Home Equipment: None  Lives With: Spouse  Functional History: Prior Function Level of Independence: Independent Functional Status:  Mobility: Bed Mobility Overal bed mobility: Needs Assistance Bed Mobility: Supine to Sit, Sit to Supine Supine to sit: Mod assist Sit to supine: Min assist General bed mobility comments: Mod A for trunk elevation. Heavy lean to L upon sitting and required assist and verbal cues for midline orientation. Pt  required extended seated rest secondary to dizziness.  Transfers Overall transfer level: Needs assistance Equipment used: 1 person hand held assist Transfers: Sit to/from Stand, Stand Pivot Transfers Sit to Stand: Max assist Stand pivot transfers: Max assist General transfer comment: Pt very guarded mobility secondary to dizziness. Max A for lift assist and steadying throughout transfer. Husband also helped with guidance to [facility].  Ambulation/Gait General Gait Details: Unable to test secondary to dizziness.     ADL:    Cognition: Cognition Overall Cognitive Status: Difficult to assess Orientation Level: Oriented X4 Cognition Arousal/Alertness: Lethargic Behavior During Therapy: Anxious Overall Cognitive Status: Difficult to assess General Comments: Pt closing eyes, and very dizzy throughout session. Required extended time to respond to questions, but feel this was because of dizziness.    Blood pressure 126/78, pulse 94, temperature 98.6 F (37 C), temperature source Oral, resp. rate 18, height 5\' 6"  (1.676 m), weight 88.9 kg (196 lb), last menstrual period 01/15/2015, SpO2 99 %. Physical Exam  Nursing note and vitals reviewed. Constitutional: She is oriented to person, place, and time. She appears well-developed and well-nourished. No distress.  HENT:  Head: Normocephalic and atraumatic.  Mouth/Throat: Oropharynx is clear and moist.  Eyes: Pupils are equal, round, and reactive to light. Conjunctivae and EOM are normal.  Neck: Normal range of motion. Neck supple.  Cardiovascular: Normal rate and regular rhythm.   Respiratory: Effort normal and breath sounds normal. No stridor. No respiratory distress.  She has no wheezes.  GI: Soft. She exhibits no distension. There is no tenderness.  Musculoskeletal: She exhibits no edema or tenderness.  Neurological: She is alert and oriented to person, place, and time. A cranial nerve deficit is present.  Needed cues to keep eyes open due to  vestibular symptoms as well as fatigue. Speech clear. Nystagmus to left lateral field and lack of vision in superior fields. Able to follow basic motor command--slow movements. Ataxia with left finger to nose and left heel to shin  Skin: Skin is warm and dry. She is not diaphoretic.  Psychiatric: Her speech is normal. Thought content normal. Her [name] appears anxious. She is slowed. Cognition and memory are normal.    Results for orders placed or performed during the hospital encounter of 12/18/16 (from the past 24 hour(s))  Sedimentation rate     Status: None   Collection Time: 12/19/16  2:53 PM  Result Value Ref Range   Sed Rate 7 0 - 22 mm/hr  CBC     Status: Abnormal   Collection Time: 12/20/16  4:40 AM  Result Value Ref Range   WBC 6.2 4.0 - 10.5 K/uL   RBC 3.88 3.87 - 5.11 MIL/uL   Hemoglobin 11.7 (L) 12.0 - 15.0 g/dL   HCT 16.1 (L) 09.6 - 04.5 %   MCV 89.7 78.0 - 100.0 fL   MCH 30.2 26.0 - 34.0 pg   MCHC 33.6 30.0 - 36.0 g/dL   RDW 40.9 81.1 - 91.4 %   Platelets 205 150 - 400 K/uL  Basic metabolic panel     Status: Abnormal   Collection Time: 12/20/16  4:40 AM  Result Value Ref Range   Sodium 139 135 - 145 mmol/L   Potassium 3.7 3.5 - 5.1 mmol/L   Chloride 106 101 - 111 mmol/L   CO2 25 22 - 32 mmol/L   Glucose, Bld 103 (H) 65 - 99 mg/dL   BUN 10 6 - 20 mg/dL   Creatinine, Ser 7.82 (H) 0.44 - 1.00 mg/dL   Calcium 8.4 (L) 8.9 - 10.3 mg/dL   GFR calc non Af Amer >60 >60 mL/min   GFR calc Af Amer >60 >60 mL/min   Anion gap 8 5 - 15   Ct Angio Head W Or Wo Contrast  Result Date: 12/19/2016 CLINICAL DATA:  Ataxia.  Acute infarct demonstrated on earlier MRI. EXAM: CT ANGIOGRAPHY HEAD AND NECK TECHNIQUE: Multidetector CT imaging of the head and neck was performed using the standard protocol during bolus administration of intravenous contrast. Multiplanar CT image reconstructions and MIPs were obtained to evaluate the vascular anatomy. Carotid stenosis measurements (when  applicable) are obtained utilizing NASCET criteria, using the distal internal carotid diameter as the denominator. CONTRAST:  50 mL Isovue 370 COMPARISON:  Brain MRI 12/19/2016 FINDINGS: CT HEAD FINDINGS Brain: No mass lesion, intraparenchymal hemorrhage or extra-axial collection. No evidence of acute cortical infarct. Brain parenchyma and CSF-containing spaces are normal for age. Skull: Normal visualized skull base, calvarium and extracranial soft tissues. Sinuses/Orbits: No sinus fluid levels or advanced mucosal thickening. No mastoid effusion. Normal orbits. Review of the MIP images confirms the above findings CTA NECK FINDINGS Aortic arch: There is no calcific atherosclerosis of the aortic arch. There is no aneurysm, dissection or hemodynamically significant stenosis of the visualized ascending aorta and aortic arch. Conventional 3 vessel aortic branching pattern. The visualized proximal subclavian arteries are normal. Right carotid system: The right common carotid origin is widely patent. There is no  common carotid or internal carotid artery dissection or aneurysm. No hemodynamically significant stenosis. Left carotid system: The left common carotid origin is widely patent. There is no common carotid or internal carotid artery dissection or aneurysm. No hemodynamically significant stenosis. Vertebral arteries: The vertebral system is right dominant. Both vertebral artery origins are normal. Both vertebral arteries are normal to their confluence with the basilar artery. Skeleton: There is no bony spinal canal stenosis. No lytic or blastic lesions. Other neck: The nasopharynx is clear. The oropharynx and hypopharynx are normal. The epiglottis is normal. The supraglottic larynx, glottis and subglottic larynx are normal. No retropharyngeal collection. The parapharyngeal spaces are preserved. The parotid and submandibular glands are normal. No sialolithiasis or salivary ductal dilatation. The thyroid gland is  normal. There is no cervical lymphadenopathy. Upper chest: No pneumothorax or pleural effusion. No nodules or masses. Review of the MIP images confirms the above findings CTA HEAD FINDINGS Anterior circulation: --Intracranial internal carotid arteries: Normal. --Anterior cerebral arteries: Normal. --Middle cerebral arteries: Normal. --Posterior communicating arteries: Absent bilaterally. Posterior circulation: --Posterior cerebral arteries: Normal. --Superior cerebellar arteries: Normal. --Basilar artery: Normal. --Anterior inferior cerebellar arteries: Not visualized, which is not uncommon. --Posterior inferior cerebellar arteries: There is attenuated opacification of the left PICA mid to distal portion. Venous sinuses: As permitted by contrast timing, patent. Anatomic variants: None Delayed phase: No parenchymal contrast enhancement. Review of the MIP images confirms the above findings IMPRESSION: 1. No emergent large vessel occlusion. Normal major intracranial arteries. 2. Asymmetrically attenuated opacification of the mid-to-distal left PICA relative to the right, which is the artery supplying the vascular territory of the cerebellar infarcts seen on MRI. However, assessment of distal portions of the small posterior fossa arteries is not entirely reliable and this may be only normal variation. 3. Normal CTA of the neck. Electronically Signed   By: Deatra Robinson M.D.   On: 12/19/2016 06:45   Ct Angio Neck W Or Wo Contrast  Result Date: 12/19/2016 CLINICAL DATA:  Ataxia.  Acute infarct demonstrated on earlier MRI. EXAM: CT ANGIOGRAPHY HEAD AND NECK TECHNIQUE: Multidetector CT imaging of the head and neck was performed using the standard protocol during bolus administration of intravenous contrast. Multiplanar CT image reconstructions and MIPs were obtained to evaluate the vascular anatomy. Carotid stenosis measurements (when applicable) are obtained utilizing NASCET criteria, using the distal internal carotid  diameter as the denominator. CONTRAST:  50 mL Isovue 370 COMPARISON:  Brain MRI 12/19/2016 FINDINGS: CT HEAD FINDINGS Brain: No mass lesion, intraparenchymal hemorrhage or extra-axial collection. No evidence of acute cortical infarct. Brain parenchyma and CSF-containing spaces are normal for age. Skull: Normal visualized skull base, calvarium and extracranial soft tissues. Sinuses/Orbits: No sinus fluid levels or advanced mucosal thickening. No mastoid effusion. Normal orbits. Review of the MIP images confirms the above findings CTA NECK FINDINGS Aortic arch: There is no calcific atherosclerosis of the aortic arch. There is no aneurysm, dissection or hemodynamically significant stenosis of the visualized ascending aorta and aortic arch. Conventional 3 vessel aortic branching pattern. The visualized proximal subclavian arteries are normal. Right carotid system: The right common carotid origin is widely patent. There is no common carotid or internal carotid artery dissection or aneurysm. No hemodynamically significant stenosis. Left carotid system: The left common carotid origin is widely patent. There is no common carotid or internal carotid artery dissection or aneurysm. No hemodynamically significant stenosis. Vertebral arteries: The vertebral system is right dominant. Both vertebral artery origins are normal. Both vertebral arteries are normal to their  confluence with the basilar artery. Skeleton: There is no bony spinal canal stenosis. No lytic or blastic lesions. Other neck: The nasopharynx is clear. The oropharynx and hypopharynx are normal. The epiglottis is normal. The supraglottic larynx, glottis and subglottic larynx are normal. No retropharyngeal collection. The parapharyngeal spaces are preserved. The parotid and submandibular glands are normal. No sialolithiasis or salivary ductal dilatation. The thyroid gland is normal. There is no cervical lymphadenopathy. Upper chest: No pneumothorax or pleural  effusion. No nodules or masses. Review of the MIP images confirms the above findings CTA HEAD FINDINGS Anterior circulation: --Intracranial internal carotid arteries: Normal. --Anterior cerebral arteries: Normal. --Middle cerebral arteries: Normal. --Posterior communicating arteries: Absent bilaterally. Posterior circulation: --Posterior cerebral arteries: Normal. --Superior cerebellar arteries: Normal. --Basilar artery: Normal. --Anterior inferior cerebellar arteries: Not visualized, which is not uncommon. --Posterior inferior cerebellar arteries: There is attenuated opacification of the left PICA mid to distal portion. Venous sinuses: As permitted by contrast timing, patent. Anatomic variants: None Delayed phase: No parenchymal contrast enhancement. Review of the MIP images confirms the above findings IMPRESSION: 1. No emergent large vessel occlusion. Normal major intracranial arteries. 2. Asymmetrically attenuated opacification of the mid-to-distal left PICA relative to the right, which is the artery supplying the vascular territory of the cerebellar infarcts seen on MRI. However, assessment of distal portions of the small posterior fossa arteries is not entirely reliable and this may be only normal variation. 3. Normal CTA of the neck. Electronically Signed   By: Deatra RobinsonKevin  Herman M.D.   On: 12/19/2016 06:45   Mr Brain Wo Contrast  Result Date: 12/19/2016 CLINICAL DATA:  Ataxia EXAM: MRI HEAD WITHOUT CONTRAST TECHNIQUE: Multiplanar, multiecho pulse sequences of the brain and surrounding structures were obtained without intravenous contrast. COMPARISON:  Brain MRI 01/20/2016 FINDINGS: Brain: The midline structures are normal. There is diffusion restriction within the medial left cerebellum and is in the left occipital lobe. No mass lesion, hydrocephalus, dural abnormality or extra-axial collection. There is hyperintense T2-weighted signal at the sites of ischemia but otherwise the parenchymal signal of the brain  is normal. No age-advanced or lobar predominant atrophy. No chronic microhemorrhage or superficial siderosis. Vascular: Major intracranial arterial and venous sinus flow voids are preserved. Skull and upper cervical spine: The visualized skull base, calvarium, upper cervical spine and extracranial soft tissues are normal. Sinuses/Orbits: No fluid levels or advanced mucosal thickening. No mastoid or middle ear effusion. Normal orbits. IMPRESSION: 1. Multifocal acute ischemia within the left posterior circulation, including the medial left cerebellum and left occipital lobe. No contralateral for anterior circulation ischemia. 2. No hemorrhage or mass effect. Electronically Signed   By: Deatra RobinsonKevin  Herman M.D.   On: 12/19/2016 03:18   Dg Abd Acute W/chest  Result Date: 12/19/2016 CLINICAL DATA:  46 year old female with abdominal pain. Recent endoscopy. EXAM: DG ABDOMEN ACUTE W/ 1V CHEST COMPARISON:  Abdominal ultrasound dated 11/13/2012 FINDINGS: The lungs are clear. Minimal bibasilar atelectatic changes/ scarring noted. There is no focal consolidation, pleural effusion, or pneumothorax. The cardiac silhouette is within normal limits. There is moderate stool throughout the colon. No bowel dilatation or evidence of obstruction. No free air or radiopaque calculi. The osseous structures and soft tissues are grossly unremarkable. IMPRESSION: 1. No acute cardiopulmonary process. 2. No bowel obstruction.  No free air. Electronically Signed   By: Elgie CollardArash  Radparvar M.D.   On: 12/19/2016 01:12    Assessment/Plan: Diagnosis: left PCA infarct with ataxia/oculo-vestibular deficits 1. Does the need for close, 24 hr/day medical supervision in concert  with the patient's rehab needs make it unreasonable for this patient to be served in a less intensive setting? Yes 2. Co-Morbidities requiring supervision/potential complications: IBS 3. Due to bladder management, bowel management, safety, skin/wound care, disease management,  medication administration, pain management and patient education, does the patient require 24 hr/day rehab nursing? Yes 4. Does the patient require coordinated care of a physician, rehab nurse, PT (1-2 hrs/day, 5 days/week) and OT (1-2 hrs/day, 5 days/week) to address physical and functional deficits in the context of the above medical diagnosis(es)? Yes Addressing deficits in the following areas: balance, endurance, locomotion, strength, transferring, bowel/bladder control, bathing, dressing, feeding, grooming, toileting and vestibular sx 5. Can the patient actively participate in an intensive therapy program of at least 3 hrs of therapy per day at least 5 days per week? Yes 6. The potential for patient to make measurable gains while on inpatient rehab is excellent 7. Anticipated functional outcomes upon discharge from inpatient rehab are modified independent  with PT, modified independent with OT, n/a with SLP. 8. Estimated rehab length of stay to reach the above functional goals is: 7-10 days 9. Anticipated D/C setting: Home 10. Anticipated post D/C treatments: HH therapy and Outpatient therapy 11. Overall Rehab/Functional Prognosis: excellent  RECOMMENDATIONS: This patient's condition is appropriate for continued rehabilitative care in the following setting: CIR Patient has agreed to participate in recommended program. Yes Note that insurance prior authorization may be required for reimbursement for recommended care.  Comment: Rehab Admissions Coordinator to follow up.  Thanks,  Ranelle Oyster, MD, Earlie Counts, PA-C 12/20/2016

## 2016-12-22 ENCOUNTER — Encounter (HOSPITAL_COMMUNITY): Payer: Self-pay | Admitting: Cardiology

## 2016-12-22 LAB — ANTIPHOSPHOLIPID SYNDROME EVAL, BLD
Anticardiolipin IgA: 10 APL U/mL (ref 0–11)
DRVVT: 33.1 s (ref 0.0–47.0)
PHOSPHATYDALSERINE, IGG: 2 {GPS'U} (ref 0–11)
PTT Lupus Anticoagulant: 32 s (ref 0.0–51.9)
Phosphatydalserine, IgA: 4 APS IgA (ref 0–20)
Phosphatydalserine, IgM: 3 MPS IgM (ref 0–25)

## 2016-12-23 ENCOUNTER — Encounter (HOSPITAL_COMMUNITY): Payer: Self-pay | Admitting: Cardiology

## 2016-12-23 DIAGNOSIS — I10 Essential (primary) hypertension: Secondary | ICD-10-CM | POA: Diagnosis not present

## 2016-12-23 DIAGNOSIS — F419 Anxiety disorder, unspecified: Secondary | ICD-10-CM | POA: Diagnosis not present

## 2016-12-23 DIAGNOSIS — R569 Unspecified convulsions: Secondary | ICD-10-CM | POA: Diagnosis not present

## 2016-12-23 DIAGNOSIS — I69398 Other sequelae of cerebral infarction: Secondary | ICD-10-CM | POA: Diagnosis not present

## 2016-12-23 DIAGNOSIS — G464 Cerebellar stroke syndrome: Secondary | ICD-10-CM | POA: Diagnosis not present

## 2016-12-24 DIAGNOSIS — I10 Essential (primary) hypertension: Secondary | ICD-10-CM | POA: Diagnosis not present

## 2016-12-24 DIAGNOSIS — G464 Cerebellar stroke syndrome: Secondary | ICD-10-CM | POA: Diagnosis not present

## 2016-12-24 DIAGNOSIS — R569 Unspecified convulsions: Secondary | ICD-10-CM | POA: Diagnosis not present

## 2016-12-24 DIAGNOSIS — I69398 Other sequelae of cerebral infarction: Secondary | ICD-10-CM | POA: Diagnosis not present

## 2016-12-24 DIAGNOSIS — F419 Anxiety disorder, unspecified: Secondary | ICD-10-CM | POA: Diagnosis not present

## 2016-12-24 LAB — CULTURE, BLOOD (ROUTINE X 2)
Culture: NO GROWTH
Culture: NO GROWTH
Special Requests: ADEQUATE

## 2016-12-25 ENCOUNTER — Ambulatory Visit: Payer: BLUE CROSS/BLUE SHIELD | Admitting: Family Medicine

## 2016-12-25 ENCOUNTER — Encounter: Payer: Self-pay | Admitting: Family Medicine

## 2016-12-25 VITALS — BP 140/100 | HR 99 | Temp 98.2°F | Resp 14 | Ht 68.0 in | Wt 188.2 lb

## 2016-12-25 DIAGNOSIS — E785 Hyperlipidemia, unspecified: Secondary | ICD-10-CM

## 2016-12-25 DIAGNOSIS — R269 Unspecified abnormalities of gait and mobility: Secondary | ICD-10-CM | POA: Diagnosis not present

## 2016-12-25 DIAGNOSIS — I69398 Other sequelae of cerebral infarction: Secondary | ICD-10-CM | POA: Diagnosis not present

## 2016-12-25 DIAGNOSIS — R519 Headache, unspecified: Secondary | ICD-10-CM

## 2016-12-25 DIAGNOSIS — R51 Headache: Secondary | ICD-10-CM

## 2016-12-25 DIAGNOSIS — I10 Essential (primary) hypertension: Secondary | ICD-10-CM | POA: Diagnosis not present

## 2016-12-25 MED ORDER — ICOSAPENT ETHYL 1 G PO CAPS: 2.0000 g | ORAL_CAPSULE | Freq: Two times a day (BID) | ORAL | 2 refills | Status: DC

## 2016-12-25 NOTE — Progress Notes (Signed)
Name: Alicia [name] Note Alicia [name]   MRN: [id number]    DOB: 1970/07/19   Date:12/25/2016       Progress Note  Subjective  Chief Complaint  Chief Complaint  Patient presents with  . hosptial follow up    [facility] discharge follow up: she was sitting watching TB at home on 12/18/2016 and at 9:01 her phone went off, she picked it up to look at it and developed acute onset of vertigo, followed by acute pain on nuchal area and occipital area ( like if something exploded on her head), followed by vomiting and epistaxis. Husband called 21 and she was transported to [facility], CT angiogram and MRI were abnormal. Showed mid to distal left PICA, she is having balance problems since the stroke, sometimes dysarthria but no weakness. Headache resolved. She has not been taking Vascepa ( she was not sure it she was supposed to) and not taking Topamax, she thoughts prn medication. Mother states she has pauses during sleep and we will check sleep study.    Patient Active Problem List   Diagnosis Date Noted  . Psychogenic nonepileptic seizure   . Chronic pain syndrome   . Restless leg syndrome   . Chronic prescription benzodiazepine use 01/20/2016  . Leukocytosis 01/20/2016  . Seizure-like activity ([place])   . GAD (generalized anxiety disorder) 02/14/2015  . Neck pain 02/14/2015  . History of hysterectomy 02/14/2015  . S/P hysterectomy 02/02/2015  . Grieving 12/16/2014  . Iron deficiency anemia due to chronic blood loss 08/29/2014  . Insomnia, persistent 08/14/2014  . Major depression ([place]) 08/14/2014  . Temporomandibular joint sounds on opening and/or closing the jaw 08/14/2014  . Degenerative disc disease, lumbar 08/14/2014  . Bleeding internal hemorrhoids 08/14/2014  . Gastric reflux 08/14/2014  . Blood glucose elevated 08/14/2014  . Irritable bowel syndrome with constipation 08/14/2014  . Hypertriglyceridemia 08/14/2014  . Overweight 08/14/2014  . Restless legs syndrome 08/14/2014  . Tinnitus  08/14/2014  . Vitamin D deficiency 08/14/2014  . Tachycardia 11/25/2012  . DOE (dyspnea on exertion) 11/06/2012  . Previous cesarean delivery, delivered, with or without mention of antepartum condition 01/19/2012    Past Surgical History:  Procedure Laterality Date  . ABDOMINOPLASTY  Feb. 2015  . CESAREAN SECTION  2011  . DILATION AND CURETTAGE OF UTERUS  2003, 2005, 2008  . HERNIA REPAIR  5093   Umbilical  . KNEE ARTHROSCOPY Right 2006  . REDUCTION MAMMAPLASTY Bilateral December 2015    Family History  Problem Relation Age of Onset  . Hypertension Mother   . Hyperlipidemia Mother   . Heart Problems Father        hole in heart and lower ventricles reversed  . Prostate cancer Maternal Grandfather   . Von Willebrand disease Maternal Uncle     Social History   Socioeconomic History  . Marital status: Married    Spouse name: Alicia [name]   . Number of children: 4  . Years of education: Not on file  . Highest education level: Not on file  Social Needs  . Financial resource strain: Not on file  . Food insecurity - worry: Not on file  . Food insecurity - inability: Not on file  . Transportation needs - medical: Not on file  . Transportation needs - non-medical: Not on file  Occupational History  . Occupation: Aeronautical engineer: [employer]  Tobacco Use  . Smoking status: Never Smoker  . Smokeless tobacco: Never Used  Substance and Sexual Activity  .  Alcohol use: No    Alcohol/week: 0.0 oz  . Drug use: No  . Sexual activity: Yes    Partners: Male  Other Topics Concern  . Not on file  Social History Narrative   First husband died suddenly in 04-22-2014   Remarried 06/07/2016     Current Outpatient Medications:  .  acetaminophen (TYLENOL) 325 MG tablet, Take 2 tablets (650 mg total) by mouth every 6 (six) hours as needed for mild pain, moderate pain or headache (or temp > 37.5 C (99.5 F))., Disp: , Rfl:  .  aspirin EC 325 MG EC tablet, Take 1 tablet (325 mg total) by  mouth daily., Disp: 30 tablet, Rfl: 0 .  atorvastatin (LIPITOR) 80 MG tablet, Take 1 tablet (80 mg total) by mouth daily at 6 PM., Disp: 30 tablet, Rfl: 0 .  carvedilol (COREG) 3.125 MG tablet, Take 1 tablet (3.125 mg total) by mouth 2 (two) times daily with a meal., Disp: 180 tablet, Rfl: 1 .  pantoprazole (PROTONIX) 40 MG tablet, Take 1 tablet (40 mg total) by mouth daily., Disp: 30 tablet, Rfl: 5 .  topiramate (TOPAMAX) 50 MG tablet, Take 1 tablet (50 mg total) by mouth daily., Disp: 30 tablet, Rfl: 0 .  ALPRAZOLAM XR 1 MG 24 hr tablet, Take 1 tablet daily by mouth., Disp: , Rfl: 1 .  cyclobenzaprine (FLEXERIL) 10 MG tablet, Take 10 mg daily as needed by mouth., Disp: , Rfl: 0 .  PARoxetine (PAXIL) 40 MG tablet, Take 40 mg daily by mouth., Disp: , Rfl: 0 .  zolpidem (AMBIEN) 10 MG tablet, Take 1 tablet at bedtime as needed by mouth., Disp: , Rfl:   Allergies  Allergen Reactions  . Azithromycin Diarrhea  . Penicillins Hives and Swelling    Has patient had a PCN reaction causing immediate rash, facial/tongue/throat swelling, SOB or lightheadedness with hypotension: Yes Has patient had a PCN reaction causing severe rash involving mucus membranes or skin necrosis: No Has patient had a PCN reaction that required hospitalization No Has patient had a PCN reaction occurring within the last 10 years: No If all of the above answers are "NO", then may proceed with Cephalosporin use.     ROS  Constitutional: Negative for fever or weight change.  Respiratory: Negative for cough and shortness of breath.   Cardiovascular: Negative for chest pain or palpitations.  Gastrointestinal: Negative for abdominal pain, no bowel changes.  Musculoskeletal: Positive  for gait problem no joint swelling.  Skin: Negative for rash.  Neurological: Negative for dizziness or headache. Balance problems No other specific complaints in a complete review of systems (except as listed in HPI above).  Objective  Vitals:    12/25/16 0813  BP: (!) 140/100  Pulse: 99  Resp: 14  Temp: 98.2 F (36.8 C)  TempSrc: Oral  SpO2: 98%  Weight: 188 lb 3.2 oz (85.4 kg)  Height: '5\' 8"'  (1.727 m)    Body mass index is 28.62 kg/m.  Physical Exam  Constitutional: Patient appears well-developed and well-nourished. Overweight  No distress.  HEENT: head atraumatic, normocephalic, pupils equal and reactive to light, ears : normal TM bilaterally,  neck supple, throat within normal limits Cardiovascular: Normal rate, regular rhythm and normal heart sounds.  No murmur heard. No BLE edema. Pulmonary/Chest: Effort normal and breath sounds normal. No respiratory distress. Abdominal: Soft.  There is no tenderness. Psychiatric: Patient has a normal mood and affect. behavior is normal. Judgment and thought content normal. Neurological exam: on wheelchair, very  ataxic when walking, romberg negative  Recent Results (from the past 2160 hour(s))  H. pylori breath test     Status: None   Collection Time: 10/10/16  9:56 AM  Result Value Ref Range   H. pylori Breath Test NOT DETECTED Not Detected    Comment:   Antimicrobials, proton pump inhibitors, and bismuth preparations are known to suppress H. pylori, and ingestion of these prior to H. pylori diagnostic testing may lead to false negative results. If clinically indicated, the test may be repeated on a new specimen obtained two weeks after discontinuing treatment.     CBC with Differential/Platelet     Status: None   Collection Time: 10/10/16 10:11 AM  Result Value Ref Range   WBC 10.2 3.8 - 10.8 K/uL   RBC 4.51 3.80 - 5.10 MIL/uL   Hemoglobin 13.9 11.7 - 15.5 g/dL   HCT 40.7 35.0 - 45.0 %   MCV 90.2 80.0 - 100.0 fL   MCH 30.8 27.0 - 33.0 pg   MCHC 34.2 32.0 - 36.0 g/dL   RDW 13.8 11.0 - 15.0 %   Platelets 261 140 - 400 K/uL   MPV 9.0 7.5 - 12.5 fL   Neutro Abs 7,650 1,500 - 7,800 cells/uL   Lymphs Abs 2,040 850 - 3,900 cells/uL   Monocytes Absolute 408 200 - 950  cells/uL   Eosinophils Absolute 102 15 - 500 cells/uL   Basophils Absolute 0 0 - 200 cells/uL   Neutrophils Relative % 75 %   Lymphocytes Relative 20 %   Monocytes Relative 4 %   Eosinophils Relative 1 %   Basophils Relative 0 %   Smear Review Criteria for review not met   COMPLETE METABOLIC PANEL WITH GFR     Status: Abnormal   Collection Time: 10/10/16 10:11 AM  Result Value Ref Range   Sodium 140 135 - 146 mmol/L   Potassium 4.6 3.5 - 5.3 mmol/L   Chloride 104 98 - 110 mmol/L   CO2 28 20 - 32 mmol/L    Comment: ** Please note change in reference range(s). **      Glucose, Bld 105 (H) 65 - 99 mg/dL   BUN 14 7 - 25 mg/dL   Creat 0.97 0.50 - 1.10 mg/dL   Total Bilirubin 0.4 0.2 - 1.2 mg/dL   Alkaline Phosphatase 66 33 - 115 U/L   AST 13 10 - 35 U/L   ALT 19 6 - 29 U/L   Total Protein 6.6 6.1 - 8.1 g/dL   Albumin 4.3 3.6 - 5.1 g/dL   Calcium 9.1 8.6 - 10.2 mg/dL   GFR, Est African American 81 >=60 mL/min   GFR, Est Non African American 70 >=60 mL/min  Insulin, fasting     Status: None   Collection Time: 10/10/16 10:11 AM  Result Value Ref Range   Insulin fasting, serum 14.4 2.0 - 19.6 uIU/mL    Comment:   This insulin assay shows strong cross-reactivity for some insulin analogs (lispro, aspart, and glargine) and much lower cross-reactivity with others (detemir, glulisine).   Stimulated Insulin reference intervals were established using the Siemens Immulite assay. These values are provided for general guidance only.   Hemoglobin A1c     Status: None   Collection Time: 10/10/16 10:11 AM  Result Value Ref Range   Hgb A1c MFr Bld 5.4 <5.7 %    Comment:   For the purpose of screening for the presence of diabetes:   <5.7%  Consistent with the absence of diabetes 5.7-6.4 %   Consistent with increased risk for diabetes (prediabetes) >=6.5 %     Consistent with diabetes   This assay result is consistent with a decreased risk of diabetes.   Currently, no consensus  exists regarding use of hemoglobin A1c for diagnosis of diabetes in children.   According to American Diabetes Association (ADA) guidelines, hemoglobin A1c <7.0% represents optimal control in non-pregnant diabetic patients. Different metrics may apply to specific patient populations. Standards of Medical Care in Diabetes (ADA).      Mean Plasma Glucose 108 mg/dL  Lipid panel     Status: Abnormal   Collection Time: 10/10/16 10:11 AM  Result Value Ref Range   Cholesterol 233 (H) <200 mg/dL   Triglycerides 305 (H) <150 mg/dL   HDL 45 (L) >50 mg/dL   Total CHOL/HDL Ratio 5.2 (H) <5.0 Ratio   VLDL 61 (H) <30 mg/dL   LDL Cholesterol 127 (H) <100 mg/dL  VITAMIN D 25 Hydroxy (Vit-D Deficiency, Fractures)     Status: Abnormal   Collection Time: 10/10/16 10:11 AM  Result Value Ref Range   Vit D, 25-Hydroxy 24 (L) 30 - 100 ng/mL    Comment: Vitamin D Status           25-OH Vitamin D        Deficiency                <20 ng/mL        Insufficiency         20 - 29 ng/mL        Optimal             > or = 30 ng/mL   For 25-OH Vitamin D testing on patients on D2-supplementation and patients for whom quantitation of D2 and D3 fractions is required, the QuestAssureD 25-OH VIT D, (D2,D3), LC/MS/MS is recommended: order code [id number] (patients > 2 yrs).   Surgical pathology     Status: None   Collection Time: 11/26/16  9:15 AM  Result Value Ref Range   SURGICAL PATHOLOGY      Surgical Pathology CASE: [id number] PATIENT: Sandford Craze Surgical Pathology Report     SPECIMEN SUBMITTED: A. Stomach, R/O gastritis; cbx B. Esophagus, R/O eosinophilic esophagitis; cbx C. Colon polyp, transverse; cold snare D. Sigmoid colon patchy erythema; cbx  CLINICAL HISTORY: None provided  PRE-OPERATIVE DIAGNOSIS: Dysphagia, screening colonoscopy  POST-OPERATIVE DIAGNOSIS: R/O gastritis; R/O eosinophilic esophagitis (EOE), esophageal dilation; patchy erythema in sigmoid colon; diverticulosis; colon  polyp; internal hemorrhoids     DIAGNOSIS: A. STOMACH; COLD BIOPSY: - ANTRAL AND OXYNTIC MUCOSA WITHOUT PATHOLOGIC CHANGES. - NEGATIVE FOR INTESTINAL METAPLASIA, DYSPLASIA, AND MALIGNANCY. - NEGATIVE FOR HELICOBACTER PYLORI IN HEMATOXYLIN AND EOSIN SECTIONS.  B. ESOPHAGUS; COLD BIOPSY: - SQUAMOUS MUCOSA WITHOUT INFLAMMATION OR REACTIVE CHANGES. - NEGATIVE FOR DYSPLASIA AND MALIGNANCY.  C. COLON POLYP, TRANSVERSE; COLD SNARE: - HYPERPLASTIC PO LYP. - NEGATIVE FOR DYSPLASIA AND MALIGNANCY.  D. SIGMOID COLON PATCHY ERYTHEMA; COLD BIOPSY: - FOCAL ACTIVE COLITIS WITH LYMPHOID AGGREGATES. - NEGATIVE FOR CHANGES SUGGESTIVE OF CHRONICITY. - NEGATIVE FOR DYSPLASIA AND MALIGNANCY.  Comment: In part D, both pieces show intact crypt architecture with neutrophilic inflammation mainly involving surface epithelium and lamina propria. There is no subepithelial collagen deposition or intraepithelial lymphocytosis. No viral inclusions or granulomas are identified. Diverticular colitis and medication effect could be considerations. Clinical correlation is suggested.   GROSS DESCRIPTION: A. Labeled: C BX stomach rule out gastritis Tissue fragment(s): 3  Size: 0.1-0.6 cm Description: pink-tan fragment  Entirely submitted in 1 cassette(s).   B. Labeled: C BX esophagus rule out eosinophilic esophagitis Tissue fragment(s): multiple Size: aggregate, 1.0 x 0.3 x 0.1 cm Description: pink-tan fragments  Entirely submitted  in one cassette(s).   C. Labeled: transverse colon polyp cold snare Tissue fragment(s): 1 Size: 0.4 cm Description: tan fragment  Entirely submitted in one cassette(s).   D. Labeled: sigmoid colon C BX at patchy erythema Tissue fragment(s): 2 Size: 0.3-0.4 cm Description: pink-tan fragments  Entirely submitted in 1 cassette(s).  Final Diagnosis performed by Bryan Lemma, MD.  Electronically signed 11/28/2016 7:27:53PM    The electronic signature indicates  that the named Attending Pathologist has evaluated the specimen  Technical component performed at [facility], [address] [place], [place] [address] Lab: [phone] Dir: Darrick Penna. Evette Doffing, MD  Professional component performed at [facility], [facility], [place], [place], [place] [address] Lab: [phone] Dir: Dellia Nims. Rubinas, MD    Lipase, blood     Status: None   Collection Time: 12/19/16 12:09 AM  Result Value Ref Range   Lipase 26 11 - 51 U/L  Comprehensive metabolic panel     Status: Abnormal   Collection Time: 12/19/16 12:09 AM  Result Value Ref Range   Sodium 135 135 - 145 mmol/L   Potassium 3.8 3.5 - 5.1 mmol/L   Chloride 103 101 - 111 mmol/L   CO2 22 22 - 32 mmol/L   Glucose, Bld 126 (H) 65 - 99 mg/dL   BUN 13 6 - 20 mg/dL   Creatinine, Ser 1.03 (H) 0.44 - 1.00 mg/dL   Calcium 8.9 8.9 - 10.3 mg/dL   Total Protein 6.6 6.5 - 8.1 g/dL   Albumin 4.0 3.5 - 5.0 g/dL   AST 19 15 - 41 U/L   ALT 16 14 - 54 U/L   Alkaline Phosphatase 67 38 - 126 U/L   Total Bilirubin 0.9 0.3 - 1.2 mg/dL   GFR calc non Af Amer >60 >60 mL/min   GFR calc Af Amer >60 >60 mL/min    Comment: (NOTE) The eGFR has been calculated using the CKD EPI equation. This calculation has not been validated in all clinical situations. eGFR's persistently <60 mL/min signify possible Chronic Kidney Disease.    Anion gap 10 5 - 15  CBC     Status: Abnormal   Collection Time: 12/19/16 12:09 AM  Result Value Ref Range   WBC 14.8 (H) 4.0 - 10.5 K/uL   RBC 4.53 3.87 - 5.11 MIL/uL   Hemoglobin 13.7 12.0 - 15.0 g/dL   HCT 39.6 36.0 - 46.0 %   MCV 87.4 78.0 - 100.0 fL   MCH 30.2 26.0 - 34.0 pg   MCHC 34.6 30.0 - 36.0 g/dL   RDW 12.9 11.5 - 15.5 %   Platelets 271 150 - 400 K/uL  Hemoglobin A1c     Status: None   Collection Time: 12/19/16 12:09 AM  Result Value Ref Range   Hgb A1c MFr Bld 5.5 4.8 - 5.6 %    Comment: (NOTE) Pre diabetes:          5.7%-6.4% Diabetes:               >6.4% Glycemic control for   <7.0% adults with diabetes    Mean Plasma Glucose 111.15 mg/dL  Lipid panel     Status: Abnormal   Collection Time: 12/19/16 12:09 AM  Result Value Ref Range   Cholesterol  230 (H) 0 - 200 mg/dL   Triglycerides 378 (H) <150 mg/dL   HDL 37 (L) >40 mg/dL   Total CHOL/HDL Ratio 6.2 RATIO   VLDL 76 (H) 0 - 40 mg/dL   LDL Cholesterol 117 (H) 0 - 99 mg/dL    Comment:        Total Cholesterol/HDL:CHD Risk Coronary Heart Disease Risk Table                     Men   Women  1/2 Average Risk   3.4   3.3  Average Risk       5.0   4.4  2 X Average Risk   9.6   7.1  3 X Average Risk  23.4   11.0        Use the calculated Patient Ratio above and the CHD Risk Table to determine the patient's CHD Risk.        ATP III CLASSIFICATION (LDL):  <100     mg/dL   Optimal  100-129  mg/dL   Near or Above                    Optimal  130-159  mg/dL   Borderline  160-189  mg/dL   High  >190     mg/dL   Very High   I-stat troponin, ED     Status: None   Collection Time: 12/19/16 12:21 AM  Result Value Ref Range   Troponin i, poc 0.00 0.00 - 0.08 ng/mL   Comment 3            Comment: Due to the release kinetics of cTnI, a negative result within the first hours of the onset of symptoms does not rule out myocardial infarction with certainty. If myocardial infarction is still suspected, repeat the test at appropriate intervals.   I-Stat Beta hCG blood, ED ([facility], [facility], [facility] only)     Status: None   Collection Time: 12/19/16 12:21 AM  Result Value Ref Range   I-stat hCG, quantitative <5.0 <5 mIU/mL   Comment 3            Comment:   GEST. AGE      CONC.  (mIU/mL)   <=1 WEEK        5 - 50     2 WEEKS       50 - 500     3 WEEKS       100 - 10,000     4 WEEKS     1,000 - 30,000        FEMALE AND NON-PREGNANT FEMALE:     LESS THAN 5 mIU/mL   Urinalysis, Routine w reflex microscopic     Status: Abnormal   Collection Time: 12/19/16  3:10 AM  Result Value Ref Range   Color,  Urine STRAW (A) YELLOW   APPearance CLEAR CLEAR   Specific Gravity, Urine 1.011 1.005 - 1.030   pH 7.0 5.0 - 8.0   Glucose, UA NEGATIVE NEGATIVE mg/dL   Hgb urine dipstick NEGATIVE NEGATIVE   Bilirubin Urine NEGATIVE NEGATIVE   Ketones, ur NEGATIVE NEGATIVE mg/dL   Protein, ur NEGATIVE NEGATIVE mg/dL   Nitrite NEGATIVE NEGATIVE   Leukocytes, UA NEGATIVE NEGATIVE  Rapid urine drug screen (hospital performed)     Status: Abnormal   Collection Time: 12/19/16  3:10 AM  Result Value Ref Range   Opiates POSITIVE (A) NONE DETECTED   Cocaine NONE DETECTED NONE  DETECTED   Benzodiazepines POSITIVE (A) NONE DETECTED   Amphetamines NONE DETECTED NONE DETECTED   Tetrahydrocannabinol NONE DETECTED NONE DETECTED   Barbiturates NONE DETECTED NONE DETECTED    Comment:        DRUG SCREEN FOR MEDICAL PURPOSES ONLY.  IF CONFIRMATION IS NEEDED FOR ANY PURPOSE, NOTIFY LAB WITHIN 5 DAYS.        LOWEST DETECTABLE LIMITS FOR URINE DRUG SCREEN Drug Class       Cutoff (ng/mL) Amphetamine      1000 Barbiturate      200 Benzodiazepine   229 Tricyclics       798 Opiates          300 Cocaine          300 THC              50   Protime-INR     Status: None   Collection Time: 12/19/16  6:40 AM  Result Value Ref Range   Prothrombin Time 13.3 11.4 - 15.2 seconds   INR 1.02   Culture, blood (routine x 2)     Status: None   Collection Time: 12/19/16  6:40 AM  Result Value Ref Range   Specimen Description BLOOD LEFT ARM    Special Requests      BOTTLES DRAWN AEROBIC AND ANAEROBIC Blood Culture adequate volume   Culture NO GROWTH 5 DAYS    Report Status 12/24/2016 FINAL   Culture, blood (routine x 2)     Status: None   Collection Time: 12/19/16  6:50 AM  Result Value Ref Range   Specimen Description BLOOD RIGHT HAND    Special Requests      IN PEDIATRIC BOTTLE Blood Culture results may not be optimal due to an excessive volume of blood received in culture bottles   Culture NO GROWTH 5 DAYS    Report  Status 12/24/2016 FINAL   ECHOCARDIOGRAM COMPLETE     Status: None   Collection Time: 12/19/16  2:31 PM  Result Value Ref Range   Weight 3,136 oz   Height 66 in   BP 119/60 mmHg  Antiphospholipid syndrome eval, bld     Status: None   Collection Time: 12/19/16  2:53 PM  Result Value Ref Range   Anticardiolipin IgA 10 0 - 11 APL U/mL    Comment: (NOTE)                          Negative:              <12                          Indeterminate:     12 - 20                          Low-Med Positive: >20 - 80                          High Positive:         >80 Performed At: [facility] [place], [place] [id number] Rush Farmer MD YC:[id number]    Anticardiolipin IgG <9 0 - 14 GPL U/mL    Comment: (NOTE)                          Negative:              <  15                          Indeterminate:     15 - 20                          Low-Med Positive: >20 - 80                          High Positive:         >80    Anticardiolipin IgM <9 0 - 12 MPL U/mL    Comment: (NOTE)                          Negative:              <13                          Indeterminate:     13 - 20                          Low-Med Positive: >20 - 80                          High Positive:         >80    PTT Lupus Anticoagulant 32.0 0.0 - 51.9 sec   DRVVT 33.1 0.0 - 47.0 sec   Phosphatydalserine, IgG 2 0 - 11 GPS IgG   Phosphatydalserine, IgM 3 0 - 25 MPS IgM   Phosphatydalserine, IgA 4 0 - 20 APS IgA   Lupus Anticoag Interp Comment:     Comment: No lupus anticoagulant was detected.  Antinuclear Antibodies, IFA     Status: None   Collection Time: 12/19/16  2:53 PM  Result Value Ref Range   ANA Ab, IFA Negative     Comment: (NOTE)                                     Negative   <1:80                                     Borderline  1:80                                     Positive   >1:80 Performed At: [facility] [place], [place] [id number] Lindon Romp MD FV:[id number]   Sedimentation rate     Status: None   Collection Time: 12/19/16  2:53 PM  Result Value Ref Range   Sed Rate 7 0 - 22 mm/hr  CBC     Status: Abnormal   Collection Time: 12/20/16  4:40 AM  Result Value Ref Range   WBC 6.2 4.0 - 10.5 K/uL   RBC 3.88 3.87 - 5.11 MIL/uL   Hemoglobin 11.7 (L) 12.0 - 15.0 g/dL   HCT 34.8 (L) 36.0 - 46.0 %   MCV 89.7 78.0 - 100.0 fL   MCH 30.2 26.0 - 34.0 pg   MCHC 33.6 30.0 -  36.0 g/dL   RDW 13.2 11.5 - 15.5 %   Platelets 205 150 - 400 K/uL  Basic metabolic panel     Status: Abnormal   Collection Time: 12/20/16  4:40 AM  Result Value Ref Range   Sodium 139 135 - 145 mmol/L   Potassium 3.7 3.5 - 5.1 mmol/L   Chloride 106 101 - 111 mmol/L   CO2 25 22 - 32 mmol/L   Glucose, Bld 103 (H) 65 - 99 mg/dL   BUN 10 6 - 20 mg/dL   Creatinine, Ser 1.01 (H) 0.44 - 1.00 mg/dL   Calcium 8.4 (L) 8.9 - 10.3 mg/dL   GFR calc non Af Amer >60 >60 mL/min   GFR calc Af Amer >60 >60 mL/min    Comment: (NOTE) The eGFR has been calculated using the CKD EPI equation. This calculation has not been validated in all clinical situations. eGFR's persistently <60 mL/min signify possible Chronic Kidney Disease.    Anion gap 8 5 - 15      PHQ2/9: Depression screen [facility] 2/9 10/10/2016 07/02/2016 04/25/2016 01/24/2016 11/01/2015  Decreased Interest 0 0 0 0 0  Down, Depressed, Hopeless 0 0 0 0 0  PHQ - 2 Score 0 0 0 0 0  Altered sleeping - - - - -  Tired, decreased energy - - - - -  Change in appetite - - - - -  Feeling bad or failure about yourself  - - - - -  Trouble concentrating - - - - -  Moving slowly or fidgety/restless - - - - -  Suicidal thoughts - - - - -  PHQ-9 Score - - - - -  Difficult doing work/chores - - - - -     Fall Risk: Fall Risk  12/25/2016 10/10/2016 07/02/2016 04/25/2016 01/24/2016  Falls in the past year? No No No No No  Number falls in past yr: - - - - -  Injury with Fall? - - - - -  [place]  1. 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 Dyslipidemia  Continue Lipitor  - Icosapent Ethyl (VASCEPA) 1 g CAPS; Take 2 g 2 (two) times daily by mouth.  Dispense: 120 capsule; Refill: 2  3. Essential hypertension  DBP is elevated today, continue coreg, at home bp has been at goal   4. Intermittent headache  No formal diagnosed of migraine, however Dr. Karn Cassis advised to start Topamax and titrate up since migraine can cause strokes.   - Ambulatory referral to Sleep Studies

## 2016-12-25 NOTE — Patient Instructions (Signed)
Start with 50 mg of Topamax at night for 3 nights, after that take two every night for 3 nights.  Take one in am and 2 at night for three nights, last dose will be 2 twice a day.

## 2016-12-26 ENCOUNTER — Telehealth: Payer: Self-pay

## 2016-12-26 DIAGNOSIS — R569 Unspecified convulsions: Secondary | ICD-10-CM | POA: Diagnosis not present

## 2016-12-26 DIAGNOSIS — I10 Essential (primary) hypertension: Secondary | ICD-10-CM | POA: Diagnosis not present

## 2016-12-26 DIAGNOSIS — F419 Anxiety disorder, unspecified: Secondary | ICD-10-CM | POA: Diagnosis not present

## 2016-12-26 DIAGNOSIS — G464 Cerebellar stroke syndrome: Secondary | ICD-10-CM | POA: Diagnosis not present

## 2016-12-26 DIAGNOSIS — I69398 Other sequelae of cerebral infarction: Secondary | ICD-10-CM | POA: Diagnosis not present

## 2016-12-26 NOTE — Telephone Encounter (Signed)
Please give verbal order.

## 2016-12-26 NOTE — Telephone Encounter (Signed)
Copied from [place] #[id number]. Topic: General - Other >> Dec 25, 2016  4:28 PM Darl Householder, RMA wrote: Reason for CRM: Timmothy Sours from [facility], requesting a verbal for OT once a week x2 weeks for ADL transfers and exercise and ok to D/C when goals are met, please call Timmothy Sours at [phone], he will fax orders

## 2016-12-27 NOTE — Telephone Encounter (Signed)
Gave verbal orders for Alicia [name] to Roe CoombsDon at [facility] on 12/27/2016 at 10:42 a.m.

## 2016-12-30 DIAGNOSIS — M79641 Pain in right hand: Secondary | ICD-10-CM | POA: Diagnosis not present

## 2016-12-31 DIAGNOSIS — F419 Anxiety disorder, unspecified: Secondary | ICD-10-CM | POA: Diagnosis not present

## 2016-12-31 DIAGNOSIS — R569 Unspecified convulsions: Secondary | ICD-10-CM | POA: Diagnosis not present

## 2016-12-31 DIAGNOSIS — I10 Essential (primary) hypertension: Secondary | ICD-10-CM | POA: Diagnosis not present

## 2016-12-31 DIAGNOSIS — G464 Cerebellar stroke syndrome: Secondary | ICD-10-CM | POA: Diagnosis not present

## 2016-12-31 DIAGNOSIS — I69398 Other sequelae of cerebral infarction: Secondary | ICD-10-CM | POA: Diagnosis not present

## 2017-01-01 ENCOUNTER — Telehealth: Payer: Self-pay

## 2017-01-01 DIAGNOSIS — I63549 Cerebral infarction due to unspecified occlusion or stenosis of unspecified cerebellar artery: Secondary | ICD-10-CM

## 2017-01-01 NOTE — Telephone Encounter (Signed)
[facility] due to patient is having seizure events during their physical therapy sessions. There has been at least 4 different occurences they have observed. [facility] has recommended the husband take his wife to the Emergency Room for evaluation but they have declined. During their last visit on Saturday 12/28/2016 she had another one and they have faxed all the notes to our office for Dr. Carlynn PurlSowles to review. But patient family is declining to take her to Emergency Room since she has an upcoming appointment with Neurology on Thursday November the 15th. Patient also has an upcoming appointment with Dr. Carlynn PurlSowles on 01/08/2017 and would like Dr. Carlynn PurlSowles to know what is going on since the husband has told them they have not been reporting her seizure activity to Dr. Carlynn PurlSowles.

## 2017-01-01 NOTE — Telephone Encounter (Signed)
Called [facility] today to inquire if they could add on a EEG to her appointment tomorrow and was told her appointment with Dr. Pearlean BrownieSethi isn't until December 6 th at 9 a.m. All the receptionist could see is that the patient has an upcoming wound check appointment tomorrow on [address]. [facility] needs a new referral for the EEG test to be added on so they are able to schedule this for the patient. Could you please add this order on the patient chart and call to speak to her.

## 2017-01-01 NOTE — Telephone Encounter (Signed)
Please contact patient and try to [facility], try to also contact neurologist, they may be able to schedule an EEG for her prior to her visit there

## 2017-01-02 ENCOUNTER — Other Ambulatory Visit: Payer: Self-pay | Admitting: Family Medicine

## 2017-01-02 ENCOUNTER — Ambulatory Visit (INDEPENDENT_AMBULATORY_CARE_PROVIDER_SITE_OTHER): Payer: Self-pay | Admitting: *Deleted

## 2017-01-02 DIAGNOSIS — S60221A Contusion of right hand, initial encounter: Secondary | ICD-10-CM | POA: Diagnosis not present

## 2017-01-02 DIAGNOSIS — I639 Cerebral infarction, unspecified: Secondary | ICD-10-CM

## 2017-01-02 DIAGNOSIS — R569 Unspecified convulsions: Secondary | ICD-10-CM

## 2017-01-02 NOTE — Telephone Encounter (Signed)
I entered the order, please make sure it is scheduled.

## 2017-01-03 ENCOUNTER — Telehealth: Payer: Self-pay

## 2017-01-03 ENCOUNTER — Other Ambulatory Visit: Payer: Self-pay

## 2017-01-03 DIAGNOSIS — R569 Unspecified convulsions: Secondary | ICD-10-CM

## 2017-01-03 LAB — CUP PACEART INCLINIC DEVICE CHECK
MDC IDC PG IMPLANT DT: 20181103
MDC IDC SESS DTM: 20181115175152

## 2017-01-03 NOTE — Telephone Encounter (Signed)
Copied from CRM [phone]. Topic: General - Other >> Jan 03, 2017  1:36 PM Gerrianne ScalePayne, Angela L wrote: Reason for CRM: Rhunette CroftDon Breasia Karges [phone] occupational [facility] for him to see patient next week   patient states that she had a seizure last week  and had a bad day yesterday due to going out to [facility] appointments  she postponed her appointment that she had today because of her legs being wobbly   >> Jan 03, 2017  1:43 PM Gerrianne ScalePayne, Angela L wrote: Rhunette Crofton Manolo Bosket [facility] want to continue more therapy on pt one day next week

## 2017-01-03 NOTE — Telephone Encounter (Signed)
Alicia [name] released EEG and sent the order to Cardiopulmonary where they review notes and schedule. They will contact patient with appt details.

## 2017-01-03 NOTE — Addendum Note (Signed)
Addended by: Sebastian AcheSAUNDERS, Brysun Eschmann S on: 01/03/2017 10:00 AM   Modules accepted: Level of Service

## 2017-01-03 NOTE — Telephone Encounter (Signed)
Verbal order given, pending EEG - ordered already placed, [facility]

## 2017-01-03 NOTE — Progress Notes (Signed)
Wound check in clinic s/p ILR implant. Dressing was removed by patient 4 days post procedure. Site without redness or edema. Small pinhole noted mid incision. Bacitracin applied, edges approximated with steri strips. Patient instructed to leave steri strips in place until next appt and to call if she notices any signs of infection. Patient verbalized understanding.  Normal device function. Battery status: Good. R-waves: 0.1643mV. 0 symptom episodes, 0 tachy episodes, 0 pause episodes, 0 brady episodes. 0 AF episodes (0% burden). Monthly summary reports and ROV with DC on 11/21 @ 1330 for wound recheck.

## 2017-01-07 ENCOUNTER — Other Ambulatory Visit: Payer: Self-pay

## 2017-01-07 ENCOUNTER — Telehealth: Payer: Self-pay

## 2017-01-07 DIAGNOSIS — I63549 Cerebral infarction due to unspecified occlusion or stenosis of unspecified cerebellar artery: Secondary | ICD-10-CM

## 2017-01-07 DIAGNOSIS — G464 Cerebellar stroke syndrome: Secondary | ICD-10-CM | POA: Diagnosis not present

## 2017-01-07 DIAGNOSIS — I69398 Other sequelae of cerebral infarction: Secondary | ICD-10-CM | POA: Diagnosis not present

## 2017-01-07 DIAGNOSIS — F419 Anxiety disorder, unspecified: Secondary | ICD-10-CM | POA: Diagnosis not present

## 2017-01-07 DIAGNOSIS — R569 Unspecified convulsions: Secondary | ICD-10-CM | POA: Diagnosis not present

## 2017-01-07 DIAGNOSIS — I10 Essential (primary) hypertension: Secondary | ICD-10-CM | POA: Diagnosis not present

## 2017-01-07 NOTE — Telephone Encounter (Signed)
Patient was informed that she has been scheduled to have her EEG on Friday, January 10, 2017 at 10am at [facility]. Patient was instructed not to have any caffeine, coffee or tea. Patient expressed verbal understanding and said thanks.

## 2017-01-08 ENCOUNTER — Ambulatory Visit (INDEPENDENT_AMBULATORY_CARE_PROVIDER_SITE_OTHER): Payer: Self-pay | Admitting: *Deleted

## 2017-01-08 ENCOUNTER — Encounter: Payer: Self-pay | Admitting: Family Medicine

## 2017-01-08 ENCOUNTER — Ambulatory Visit: Payer: BLUE CROSS/BLUE SHIELD | Admitting: Family Medicine

## 2017-01-08 VITALS — BP 110/80 | HR 99 | Resp 12 | Ht 68.0 in | Wt 180.0 lb

## 2017-01-08 DIAGNOSIS — R569 Unspecified convulsions: Secondary | ICD-10-CM | POA: Diagnosis not present

## 2017-01-08 DIAGNOSIS — I69398 Other sequelae of cerebral infarction: Secondary | ICD-10-CM

## 2017-01-08 DIAGNOSIS — I63549 Cerebral infarction due to unspecified occlusion or stenosis of unspecified cerebellar artery: Secondary | ICD-10-CM | POA: Diagnosis not present

## 2017-01-08 DIAGNOSIS — M25561 Pain in right knee: Secondary | ICD-10-CM

## 2017-01-08 DIAGNOSIS — R269 Unspecified abnormalities of gait and mobility: Secondary | ICD-10-CM | POA: Diagnosis not present

## 2017-01-08 DIAGNOSIS — I639 Cerebral infarction, unspecified: Secondary | ICD-10-CM

## 2017-01-08 DIAGNOSIS — R197 Diarrhea, unspecified: Secondary | ICD-10-CM | POA: Diagnosis not present

## 2017-01-08 MED ORDER — TOPIRAMATE 50 MG PO TABS: 50.0000 mg | ORAL_TABLET | Freq: Every day | ORAL | 0 refills | Status: DC

## 2017-01-08 MED ORDER — DICLOFENAC SODIUM 1 % TD GEL: 4.0000 g | Freq: Four times a day (QID) | TRANSDERMAL | 2 refills | Status: DC

## 2017-01-08 NOTE — Progress Notes (Signed)
Wound recheck in clinic.  Steri-strips removed.  Site without redness or edema.  Incision edges approximated, wound well healed.  Patient and husband educated about Carelink monitor and wound care.  ROV with Dr. Elberta Fortisamnitz PRN.

## 2017-01-08 NOTE — Progress Notes (Signed)
Name: Alicia [name] Note Randal   MRN: [id number]    DOB: 1971-02-01   Date:01/08/2017       Progress Note  Subjective  Chief Complaint  Chief Complaint  Patient presents with  . Cerebrovascular Accident    follow up  . Medication Problem    wants to split the dose of her aspirin. it is causing diarrhea and nausea.    [facility] discharge follow up: she was sitting watching TB at home on 12/18/2016 and at 9:01 her phone went off, she picked it up to look at it and developed acute onset of vertigo, followed by acute pain on nuchal area and occipital area ( like if something exploded on her head), followed by vomiting and epistaxis. Husband called 69 and she was transported to [facility], CT angiogram and MRI were abnormal. Showed mid to distal left PICA, she is having balance problems since the stroke, sometimes dysarthria but no weakness. Headache is still present at night time, mild dull ache. She is getting PT and OT three times a week, still walks veering to the left. She has noticed nausea with high dose os aspirin, she would like to take three 81 mg daily She has also noticed right medial knee pain and swelling for the past weeks ( she states started after stroke) but getting worse. No erythema or increase in warmth. She has noticed episodes of watery stools that lasts about 24 hours every 5-7 days. She had some seizure like activity since discharged, home health contacted [place] last week but she refused to go to [facility]. No episodes since, she has EEG already scheduled   Husband was here with her today  Patient Active Problem List   Diagnosis Date Noted  . Psychogenic nonepileptic seizure   . Chronic pain syndrome   . Restless leg syndrome   . Chronic prescription benzodiazepine use 01/20/2016  . Leukocytosis 01/20/2016  . Seizure-like activity ([place])   . GAD (generalized anxiety disorder) 02/14/2015  . Neck pain 02/14/2015  . History of hysterectomy 02/14/2015  . S/P hysterectomy 02/02/2015   . Grieving 12/16/2014  . Iron deficiency anemia due to chronic blood loss 08/29/2014  . Insomnia, persistent 08/14/2014  . Major depression ([place]) 08/14/2014  . Temporomandibular joint sounds on opening and/or closing the jaw 08/14/2014  . Degenerative disc disease, lumbar 08/14/2014  . Bleeding internal hemorrhoids 08/14/2014  . Gastric reflux 08/14/2014  . Blood glucose elevated 08/14/2014  . Irritable bowel syndrome with constipation 08/14/2014  . Hypertriglyceridemia 08/14/2014  . Overweight 08/14/2014  . Restless legs syndrome 08/14/2014  . Tinnitus 08/14/2014  . Vitamin D deficiency 08/14/2014  . Tachycardia 11/25/2012  . DOE (dyspnea on exertion) 11/06/2012  . Previous cesarean delivery, delivered, with or without mention of antepartum condition 01/19/2012    Past Surgical History:  Procedure Laterality Date  . ABDOMINOPLASTY  Feb. 2015  . CESAREAN SECTION  2011  . COLONOSCOPY WITH PROPOFOL N/A 11/26/2016   Procedure: COLONOSCOPY WITH PROPOFOL;  Surgeon: Jonathon Bellows, MD;  Location: [facility] ENDOSCOPY;  Service: Gastroenterology;  Laterality: N/A;  . CYSTOSCOPY  02/02/2015   Procedure: CYSTOSCOPY;  Surgeon: Gae Dry, MD;  Location: [facility] ORS;  Service: Gynecology;;  . Brigitte Pulse AND CURETTAGE OF UTERUS  2003, 2005, 2008  . ESOPHAGOGASTRODUODENOSCOPY (EGD) WITH PROPOFOL N/A 11/26/2016   Procedure: ESOPHAGOGASTRODUODENOSCOPY (EGD) WITH PROPOFOL;  Surgeon: Jonathon Bellows, MD;  Location: [facility] ENDOSCOPY;  Service: Gastroenterology;  Laterality: N/A;  . HERNIA REPAIR  [id number]   Umbilical  .  KNEE ARTHROSCOPY Right Apr 18, 2004  . LAPAROSCOPIC BILATERAL SALPINGECTOMY Bilateral 02/02/2015   Procedure: LAPAROSCOPIC BILATERAL SALPINGECTOMY;  Surgeon: Gae Dry, MD;  Location: [facility] ORS;  Service: Gynecology;  Laterality: Bilateral;  . LAPAROSCOPIC HYSTERECTOMY N/A 02/02/2015   Procedure: HYSTERECTOMY TOTAL LAPAROSCOPIC;  Surgeon: Gae Dry, MD;  Location: [facility] ORS;  Service: Gynecology;   Laterality: N/A;  . LOOP RECORDER INSERTION N/A 12/20/2016   Procedure: LOOP RECORDER INSERTION;  Surgeon: Constance Haw, MD;  Location: [place] CV LAB;  Service: Cardiovascular;  Laterality: N/A;  . REDUCTION MAMMAPLASTY Bilateral December 2015  . TEE WITHOUT CARDIOVERSION N/A 12/20/2016   Procedure: TRANSESOPHAGEAL ECHOCARDIOGRAM (TEE);  Surgeon: Louanne Spark, MD;  Location: [facility] ENDOSCOPY;  Service: Cardiovascular;  Laterality: N/A;    Family History  Problem Relation Age of Onset  . Hypertension Mother   . Hyperlipidemia Mother   . Heart Problems Father        hole in heart and lower ventricles reversed  . Prostate cancer Maternal Grandfather   . Von Willebrand disease Maternal Uncle     Social History   Socioeconomic History  . Marital status: Married    Spouse name: [name]   . Number of children: 4  . Years of education: Not on file  . Highest education level: Not on file  Social Needs  . Financial resource strain: Not on file  . Food insecurity - worry: Not on file  . Food insecurity - inability: Not on file  . Transportation needs - medical: Not on file  . Transportation needs - non-medical: Not on file  Occupational History  . Occupation: Aeronautical engineer: [employer]  Tobacco Use  . Smoking status: Never Smoker  . Smokeless tobacco: Never Used  Substance and Sexual Activity  . Alcohol use: No    Alcohol/week: 0.0 oz  . Drug use: No  . Sexual activity: Yes    Partners: Male  Other Topics Concern  . Not on file  Social History Narrative   First husband died suddenly in 04/18/2014   Remarried 06/07/2016     Current Outpatient Medications:  .  acetaminophen (TYLENOL) 325 MG tablet, Take 2 tablets (650 mg total) by mouth every 6 (six) hours as needed for mild pain, moderate pain or headache (or temp > 37.5 C (99.5 F))., Disp: , Rfl:  .  ALPRAZOLAM XR 1 MG 24 hr tablet, Take 1 tablet daily by mouth., Disp: , Rfl: 1 .  aspirin EC 325 MG EC  tablet, Take 1 tablet (325 mg total) by mouth daily., Disp: 30 tablet, Rfl: 0 .  atorvastatin (LIPITOR) 80 MG tablet, Take 1 tablet (80 mg total) by mouth daily at 6 PM., Disp: 30 tablet, Rfl: 0 .  carvedilol (COREG) 3.125 MG tablet, Take 1 tablet (3.125 mg total) by mouth 2 (two) times daily with a meal., Disp: 180 tablet, Rfl: 1 .  cyclobenzaprine (FLEXERIL) 10 MG tablet, Take 10 mg daily as needed by mouth., Disp: , Rfl: 0 .  Icosapent Ethyl (VASCEPA) 1 g CAPS, Take 2 g 2 (two) times daily by mouth., Disp: 120 capsule, Rfl: 2 .  pantoprazole (PROTONIX) 40 MG tablet, Take 1 tablet (40 mg total) by mouth daily., Disp: 30 tablet, Rfl: 5 .  PARoxetine (PAXIL) 40 MG tablet, Take 40 mg daily by mouth., Disp: , Rfl: 0 .  topiramate (TOPAMAX) 50 MG tablet, Take 1 tablet (50 mg total) by mouth daily., Disp: 30 tablet, Rfl: 0 .  zolpidem (AMBIEN) 10 MG tablet, Take 1 tablet at bedtime as needed by mouth., Disp: , Rfl:   Allergies  Allergen Reactions  . Azithromycin Diarrhea  . Penicillins Hives and Swelling    Has patient had a PCN reaction causing immediate rash, facial/tongue/throat swelling, SOB or lightheadedness with hypotension: Yes Has patient had a PCN reaction causing severe rash involving mucus membranes or skin necrosis: No Has patient had a PCN reaction that required hospitalization No Has patient had a PCN reaction occurring within the last 10 years: No If all of the above answers are "NO", then may proceed with Cephalosporin use.     ROS  Constitutional: Negative for fever, positive for  weight change.  Respiratory: Negative for cough and shortness of breath.   Cardiovascular: Negative for chest pain or palpitations.  Gastrointestinal: Negative for abdominal pain, positive for  bowel changes - has diarrhea every 4 days.   Musculoskeletal: Positive  for gait problem and  joint swelling - right medial knee .  Skin: Negative for rash.  Neurological: Negative for dizziness or  headache.  No other specific complaints in a complete review of systems (except as listed in HPI above).  Objective  Vitals:   01/08/17 1530  BP: 110/80  Pulse: 99  Resp: 12  SpO2: 98%  Weight: 180 lb (81.6 kg)  Height: _0  (1.727 m)    Body mass index is 27.37 kg/m.  Physical Exam  Constitutional: Patient appears well-developed and well-nourished. Overweight  No distress.  HEENT: head atraumatic, normocephalic, pupils equal and reactive to light, ears : normal TM bilaterally,  neck supple, throat within normal limits Cardiovascular: Normal rate, regular rhythm and normal heart sounds.  No murmur heard. No BLE edema. Pulmonary/Chest: Effort normal and breath sounds normal. No respiratory distress. Abdominal: Soft.  There is no tenderness. Psychiatric: Patient has a normal mood and affect. behavior is normal. Judgment and thought content normal. Neurological exam: on wheelchair, still has some ataxia -uses wall to assist gait,  romberg negative, she has some dysarthria and also difficulty finding words  Muscular Skeletal: pain with palpation of right medial aspect of right knee, decrease in flexion of knee, normal calf exam, no erythema, increase in warmth or ecchymosis.   Recent Results (from the past 2160 hour(s))  Surgical pathology     Status: None   Collection Time: 11/26/16  9:15 AM  Result Value Ref Range   SURGICAL PATHOLOGY      Surgical Pathology CASE: [id number] PATIENT: Sandford Craze Surgical Pathology Report     SPECIMEN SUBMITTED: A. Stomach, R/O gastritis; cbx B. Esophagus, R/O eosinophilic esophagitis; cbx C. Colon polyp, transverse; cold snare D. Sigmoid colon patchy erythema; cbx  CLINICAL HISTORY: None provided  PRE-OPERATIVE DIAGNOSIS: Dysphagia, screening colonoscopy  POST-OPERATIVE DIAGNOSIS: R/O gastritis; R/O eosinophilic esophagitis (EOE), esophageal dilation; patchy erythema in sigmoid colon; diverticulosis; colon polyp;  internal hemorrhoids     DIAGNOSIS: A. STOMACH; COLD BIOPSY: - ANTRAL AND OXYNTIC MUCOSA WITHOUT PATHOLOGIC CHANGES. - NEGATIVE FOR INTESTINAL METAPLASIA, DYSPLASIA, AND MALIGNANCY. - NEGATIVE FOR HELICOBACTER PYLORI IN HEMATOXYLIN AND EOSIN SECTIONS.  B. ESOPHAGUS; COLD BIOPSY: - SQUAMOUS MUCOSA WITHOUT INFLAMMATION OR REACTIVE CHANGES. - NEGATIVE FOR DYSPLASIA AND MALIGNANCY.  C. COLON POLYP, TRANSVERSE; COLD SNARE: - HYPERPLASTIC PO LYP. - NEGATIVE FOR DYSPLASIA AND MALIGNANCY.  D. SIGMOID COLON PATCHY ERYTHEMA; COLD BIOPSY: - FOCAL ACTIVE COLITIS WITH LYMPHOID AGGREGATES. - NEGATIVE FOR CHANGES SUGGESTIVE OF CHRONICITY. - NEGATIVE FOR DYSPLASIA AND MALIGNANCY.  Comment: In part D, both  pieces show intact crypt architecture with neutrophilic inflammation mainly involving surface epithelium and lamina propria. There is no subepithelial collagen deposition or intraepithelial lymphocytosis. No viral inclusions or granulomas are identified. Diverticular colitis and medication effect could be considerations. Clinical correlation is suggested.   GROSS DESCRIPTION: A. Labeled: C BX stomach rule out gastritis Tissue fragment(s): 3 Size: 0.1-0.6 cm Description: pink-tan fragment  Entirely submitted in 1 cassette(s).   B. Labeled: C BX esophagus rule out eosinophilic esophagitis Tissue fragment(s): multiple Size: aggregate, 1.0 x 0.3 x 0.1 cm Description: pink-tan fragments  Entirely submitted  in one cassette(s).   C. Labeled: transverse colon polyp cold snare Tissue fragment(s): 1 Size: 0.4 cm Description: tan fragment  Entirely submitted in one cassette(s).   D. Labeled: sigmoid colon C BX at patchy erythema Tissue fragment(s): 2 Size: 0.3-0.4 cm Description: pink-tan fragments  Entirely submitted in 1 cassette(s).  Final Diagnosis performed by Bryan Lemma, MD.  Electronically signed 11/28/2016 7:27:53PM    The electronic signature indicates that the  named Attending Pathologist has evaluated the specimen  Technical component performed at [facility], [address], [place], [place] [address] Lab: [phone] Dir: Darrick Penna. Evette Doffing, MD  Professional component performed at [facility], [facility], [place], [place], [place] [address] Lab: [phone] Dir: Dellia Nims. Rubinas, MD    Lipase, blood     Status: None   Collection Time: 12/19/16 12:09 AM  Result Value Ref Range   Lipase 26 11 - 51 U/L  Comprehensive metabolic panel     Status: Abnormal   Collection Time: 12/19/16 12:09 AM  Result Value Ref Range   Sodium 135 135 - 145 mmol/L   Potassium 3.8 3.5 - 5.1 mmol/L   Chloride 103 101 - 111 mmol/L   CO2 22 22 - 32 mmol/L   Glucose, Bld 126 (H) 65 - 99 mg/dL   BUN 13 6 - 20 mg/dL   Creatinine, Ser 1.03 (H) 0.44 - 1.00 mg/dL   Calcium 8.9 8.9 - 10.3 mg/dL   Total Protein 6.6 6.5 - 8.1 g/dL   Albumin 4.0 3.5 - 5.0 g/dL   AST 19 15 - 41 U/L   ALT 16 14 - 54 U/L   Alkaline Phosphatase 67 38 - 126 U/L   Total Bilirubin 0.9 0.3 - 1.2 mg/dL   GFR calc non Af Amer >60 >60 mL/min   GFR calc Af Amer >60 >60 mL/min    Comment: (NOTE) The eGFR has been calculated using the CKD EPI equation. This calculation has not been validated in all clinical situations. eGFR's persistently <60 mL/min signify possible Chronic Kidney Disease.    Anion gap 10 5 - 15  CBC     Status: Abnormal   Collection Time: 12/19/16 12:09 AM  Result Value Ref Range   WBC 14.8 (H) 4.0 - 10.5 K/uL   RBC 4.53 3.87 - 5.11 MIL/uL   Hemoglobin 13.7 12.0 - 15.0 g/dL   HCT 39.6 36.0 - 46.0 %   MCV 87.4 78.0 - 100.0 fL   MCH 30.2 26.0 - 34.0 pg   MCHC 34.6 30.0 - 36.0 g/dL   RDW 12.9 11.5 - 15.5 %   Platelets 271 150 - 400 K/uL  Hemoglobin A1c     Status: None   Collection Time: 12/19/16 12:09 AM  Result Value Ref Range   Hgb A1c MFr Bld 5.5 4.8 - 5.6 %    Comment: (NOTE) Pre diabetes:  5.7%-6.4% Diabetes:               >6.4% Glycemic control for   <7.0% adults with diabetes    Mean Plasma Glucose 111.15 mg/dL  Lipid panel     Status: Abnormal   Collection Time: 12/19/16 12:09 AM  Result Value Ref Range   Cholesterol 230 (H) 0 - 200 mg/dL   Triglycerides 378 (H) <150 mg/dL   HDL 37 (L) >40 mg/dL   Total CHOL/HDL Ratio 6.2 RATIO   VLDL 76 (H) 0 - 40 mg/dL   LDL Cholesterol 117 (H) 0 - 99 mg/dL    Comment:        Total Cholesterol/HDL:CHD Risk Coronary Heart Disease Risk Table                     Men   Women  1/2 Average Risk   3.4   3.3  Average Risk       5.0   4.4  2 X Average Risk   9.6   7.1  3 X Average Risk  23.4   11.0        Use the calculated Patient Ratio above and the CHD Risk Table to determine the patient's CHD Risk.        ATP III CLASSIFICATION (LDL):  <100     mg/dL   Optimal  100-129  mg/dL   Near or Above                    Optimal  130-159  mg/dL   Borderline  160-189  mg/dL   High  >190     mg/dL   Very High   I-stat troponin, ED     Status: None   Collection Time: 12/19/16 12:21 AM  Result Value Ref Range   Troponin i, poc 0.00 0.00 - 0.08 ng/mL   Comment 3            Comment: Due to the release kinetics of cTnI, a negative result within the first hours of the onset of symptoms does not rule out myocardial infarction with certainty. If myocardial infarction is still suspected, repeat the test at appropriate intervals.   I-Stat Beta hCG blood, ED ([facility], [facility], [facility] only)     Status: None   Collection Time: 12/19/16 12:21 AM  Result Value Ref Range   I-stat hCG, quantitative <5.0 <5 mIU/mL   Comment 3            Comment:   GEST. AGE      CONC.  (mIU/mL)   <=1 WEEK        5 - 50     2 WEEKS       50 - 500     3 WEEKS       100 - 10,000     4 WEEKS     1,000 - 30,000        FEMALE AND NON-PREGNANT FEMALE:     LESS THAN 5 mIU/mL   Urinalysis, Routine w reflex microscopic     Status: Abnormal   Collection Time: 12/19/16  3:10 AM  Result Value Ref Range   Color,  Urine STRAW (A) YELLOW   APPearance CLEAR CLEAR   Specific Gravity, Urine 1.011 1.005 - 1.030   pH 7.0 5.0 - 8.0   Glucose, UA NEGATIVE NEGATIVE mg/dL   Hgb urine dipstick NEGATIVE NEGATIVE   Bilirubin Urine NEGATIVE NEGATIVE   Ketones, ur NEGATIVE NEGATIVE  mg/dL   Protein, ur NEGATIVE NEGATIVE mg/dL   Nitrite NEGATIVE NEGATIVE   Leukocytes, UA NEGATIVE NEGATIVE  Rapid urine drug screen (hospital performed)     Status: Abnormal   Collection Time: 12/19/16  3:10 AM  Result Value Ref Range   Opiates POSITIVE (A) NONE DETECTED   Cocaine NONE DETECTED NONE DETECTED   Benzodiazepines POSITIVE (A) NONE DETECTED   Amphetamines NONE DETECTED NONE DETECTED   Tetrahydrocannabinol NONE DETECTED NONE DETECTED   Barbiturates NONE DETECTED NONE DETECTED    Comment:        DRUG SCREEN FOR MEDICAL PURPOSES ONLY.  IF CONFIRMATION IS NEEDED FOR ANY PURPOSE, NOTIFY LAB WITHIN 5 DAYS.        LOWEST DETECTABLE LIMITS FOR URINE DRUG SCREEN Drug Class       Cutoff (ng/mL) Amphetamine      1000 Barbiturate      200 Benzodiazepine   867 Tricyclics       619 Opiates          300 Cocaine          300 THC              50   Protime-INR     Status: None   Collection Time: 12/19/16  6:40 AM  Result Value Ref Range   Prothrombin Time 13.3 11.4 - 15.2 seconds   INR 1.02   Culture, blood (routine x 2)     Status: None   Collection Time: 12/19/16  6:40 AM  Result Value Ref Range   Specimen Description BLOOD LEFT ARM    Special Requests      BOTTLES DRAWN AEROBIC AND ANAEROBIC Blood Culture adequate volume   Culture NO GROWTH 5 DAYS    Report Status 12/24/2016 FINAL   Culture, blood (routine x 2)     Status: None   Collection Time: 12/19/16  6:50 AM  Result Value Ref Range   Specimen Description BLOOD RIGHT HAND    Special Requests      IN PEDIATRIC BOTTLE Blood Culture results may not be optimal due to an excessive volume of blood received in culture bottles   Culture NO GROWTH 5 DAYS    Report  Status 12/24/2016 FINAL   ECHOCARDIOGRAM COMPLETE     Status: None   Collection Time: 12/19/16  2:31 PM  Result Value Ref Range   Weight 3,136 oz   Height 66 in   BP 119/60 mmHg  Antiphospholipid syndrome eval, bld     Status: None   Collection Time: 12/19/16  2:53 PM  Result Value Ref Range   Anticardiolipin IgA 10 0 - 11 APL U/mL    Comment: (NOTE)                          Negative:              <12                          Indeterminate:     12 - 20                          Low-Med Positive: >20 - 80                          High Positive:         >80  Performed At: [facility] [place], [place] [id number] Rush Farmer MD [id number]    Anticardiolipin IgG <9 0 - 14 GPL U/mL    Comment: (NOTE)                          Negative:              <15                          Indeterminate:     15 - 20                          Low-Med Positive: >20 - 80                          High Positive:         >80    Anticardiolipin IgM <9 0 - 12 MPL U/mL    Comment: (NOTE)                          Negative:              <13                          Indeterminate:     13 - 20                          Low-Med Positive: >20 - 80                          High Positive:         >80    PTT Lupus Anticoagulant 32.0 0.0 - 51.9 sec   DRVVT 33.1 0.0 - 47.0 sec   Phosphatydalserine, IgG 2 0 - 11 GPS IgG   Phosphatydalserine, IgM 3 0 - 25 MPS IgM   Phosphatydalserine, IgA 4 0 - 20 APS IgA   Lupus Anticoag Interp Comment:     Comment: No lupus anticoagulant was detected.  Antinuclear Antibodies, IFA     Status: None   Collection Time: 12/19/16  2:53 PM  Result Value Ref Range   ANA Ab, IFA Negative     Comment: (NOTE)                                     Negative   <1:80                                     Borderline  1:80                                     Positive   >1:80 Performed At: [facility] [place], [place] [id number] Lindon Romp MD [id number]   Sedimentation rate     Status: None   Collection Time: 12/19/16  2:53 PM  Result Value Ref Range   Sed Rate 7 0 - 22 mm/hr  CBC     Status:  Abnormal   Collection Time: 12/20/16  4:40 AM  Result Value Ref Range   WBC 6.2 4.0 - 10.5 K/uL   RBC 3.88 3.87 - 5.11 MIL/uL   Hemoglobin 11.7 (L) 12.0 - 15.0 g/dL   HCT 34.8 (L) 36.0 - 46.0 %   MCV 89.7 78.0 - 100.0 fL   MCH 30.2 26.0 - 34.0 pg   MCHC 33.6 30.0 - 36.0 g/dL   RDW 13.2 11.5 - 15.5 %   Platelets 205 150 - 400 K/uL  Basic metabolic panel     Status: Abnormal   Collection Time: 12/20/16  4:40 AM  Result Value Ref Range   Sodium 139 135 - 145 mmol/L   Potassium 3.7 3.5 - 5.1 mmol/L   Chloride 106 101 - 111 mmol/L   CO2 25 22 - 32 mmol/L   Glucose, Bld 103 (H) 65 - 99 mg/dL   BUN 10 6 - 20 mg/dL   Creatinine, Ser 1.01 (H) 0.44 - 1.00 mg/dL   Calcium 8.4 (L) 8.9 - 10.3 mg/dL   GFR calc non Af Amer >60 >60 mL/min   GFR calc Af Amer >60 >60 mL/min    Comment: (NOTE) The eGFR has been calculated using the CKD EPI equation. This calculation has not been validated in all clinical situations. eGFR's persistently <60 mL/min signify possible Chronic Kidney Disease.    Anion gap 8 5 - 15  CUP PACEART INCLINIC DEVICE CHECK     Status: None   Collection Time: 01/02/17  5:51 PM  Result Value Ref Range   Date Time Interrogation Session [id number]    Pulse Generator Manufacturer MERM    Pulse Gen Model [id number] Reveal LINQ    Pulse Gen Serial Number [id number]    Clinic Name [facility] Pulse Generator Type ICM/ILR    Implantable Pulse Generator Implant Date [id number]    Battery Status OK    Eval Rhythm ST 105      PHQ2/9: Depression screen [facility] 2/9 10/10/2016 07/02/2016 04/25/2016 01/24/2016 11/01/2015  Decreased Interest 0 0 0 0 0  Down, Depressed, Hopeless 0 0 0 0 0  PHQ - 2 Score 0 0 0 0 0  Altered sleeping - - - - -  Tired, decreased energy - - - - -  Change in appetite - - - - -   Feeling bad or failure about yourself  - - - - -  Trouble concentrating - - - - -  Moving slowly or fidgety/restless - - - - -  Suicidal thoughts - - - - -  PHQ-9 Score - - - - -  Difficult doing work/chores - - - - -     Fall Risk: Fall Risk  01/08/2017 12/25/2016 10/10/2016 07/02/2016 04/25/2016  Falls in the past year? Yes No No No No  Number falls in past yr: 1 - - - -  Injury with Fall? Yes - - - -  Comment - - - - -     Functional Status Survey: Is the patient deaf or have difficulty hearing?: No Does the patient have difficulty seeing, even when wearing glasses/contacts?: No Does the patient have difficulty concentrating, remembering, or making decisions?: No Does the patient have difficulty walking or climbing stairs?: Yes Does the patient have difficulty dressing or bathing?: Yes Does the patient have difficulty doing errands alone such as visiting a doctor's office or shopping?: Yes    Assessment & Plan  1. Cerebrovascular accident (CVA) due to stenosis  of cerebellar artery, unspecified blood vessel laterality (HCC)  - topiramate (TOPAMAX) 50 MG tablet; Take 1 tablet (50 mg total) by mouth daily.  Dispense: 30 tablet; Refill: 0  2. Seizure-like activity (HCC)  - topiramate (TOPAMAX) 50 MG tablet; Take 1 tablet (50 mg total) by mouth daily.  Dispense: 30 tablet; Refill: 0  3. Abnormality of gait as late effect of cerebrovascular accident (CVA)  - topiramate (TOPAMAX) 50 MG tablet; Take 1 tablet (50 mg total) by mouth daily.  Dispense: 30 tablet; Refill: 0  4. Right medial knee pain  It could be a ligament tear, however cannot have surgery at this time, secondary to recent stroke, we will try a sleeve and topical medication - diclofenac sodium (VOLTAREN) 1 % GEL; Apply 4 g topically 4 (four) times daily. Right medial knee  Dispense: 100 g; Refill: 2  5. Diarrhea, unspecified type  She has noticed episodes of diarrhea every 4 days no blood in stools or mucus,  seems to be related to increase in activity level the day prior to diarrhea. She has a follow up on Monday with GI, advised her to discuss it with them.   She would like to try taking 81 mg three daily instead of 325 mg and I told her that it is okay

## 2017-01-10 ENCOUNTER — Ambulatory Visit: Payer: BLUE CROSS/BLUE SHIELD | Attending: Family Medicine

## 2017-01-10 DIAGNOSIS — I63549 Cerebral infarction due to unspecified occlusion or stenosis of unspecified cerebellar artery: Secondary | ICD-10-CM | POA: Diagnosis not present

## 2017-01-13 ENCOUNTER — Ambulatory Visit (INDEPENDENT_AMBULATORY_CARE_PROVIDER_SITE_OTHER): Payer: BLUE CROSS/BLUE SHIELD | Admitting: Gastroenterology

## 2017-01-13 ENCOUNTER — Encounter: Payer: Self-pay | Admitting: Gastroenterology

## 2017-01-13 VITALS — BP 88/60 | HR 73 | Temp 97.6°F | Ht 68.0 in | Wt 183.0 lb

## 2017-01-13 DIAGNOSIS — K219 Gastro-esophageal reflux disease without esophagitis: Secondary | ICD-10-CM | POA: Diagnosis not present

## 2017-01-13 NOTE — Progress Notes (Addendum)
Alicia MoodKiran Yanelle Sousa MD, MRCP(U.K) [address]  [address]  [place], [place] [address]  Main: [phone]  Fax: [phone]   Primary Care Physician: Alicia CorySowles, [name], MD  Primary Gastroenterologist:  Dr. [name]   No chief complaint on file.   HPI: Alicia [name] is a 46 y.o. female    Summary of history : She is here today to follow up to her initial visit when she was seen on 11/13/16 for GERD and dyspepsia. CT chest/abdomen and pelvis in 01/2016 was normal . Labs 09/2016 :  H pylori breath test -negative ,CBC,CMP-normal .Last attempt at colonoscopy in 2014 was incomplete due to poor prep.In 2015 started having reflux after a tummy tuck . She describes her symptoms as food coming back up , water comes up, but Gatorade or other drinks go down .She was not on a PPI at her initial visit and I started her on one.   Interval history   11/13/2016-  01/13/2017  11/26/16- EGD: Schatzki's ring dilated to 18 mm. Esophageal biopsies were negative for any abnormalities. Colonoscopy showed internal hemorrhoids, diverticulosis, small hyperplastic polyp, a localized erythematous patch in the sigmoid colon , biopsies of which showed active colitis with no chronic changes.    12/18/16 - acute stroke  Since starting the PPI has had no issues with dysphagia. " that medication did wonders" She is taking Protonix 40 mg once a day . Taking first thing in the morning on an empty stomach.   Since she had the stroke been having diarrhea - started on coreg, not on any miralax, placed on 325 asprin . , on lipitor and topomax.  The diarrhea has stopped .   Current Outpatient Medications  Medication Sig Dispense Refill  . acetaminophen (TYLENOL) 325 MG tablet Take 2 tablets (650 mg total) by mouth every 6 (six) hours as needed for mild pain, moderate pain or headache (or temp > 37.5 C (99.5 F)).    Marland Kitchen. ALPRAZOLAM XR 1 MG 24 hr tablet Take 1 tablet daily by mouth.  1  . aspirin EC 325 MG EC tablet Take  1 tablet (325 mg total) by mouth daily. 30 tablet 0  . atorvastatin (LIPITOR) 80 MG tablet Take 1 tablet (80 mg total) by mouth daily at 6 PM. 30 tablet 0  . carvedilol (COREG) 3.125 MG tablet Take 1 tablet (3.125 mg total) by mouth 2 (two) times daily with a meal. 180 tablet 1  . cyclobenzaprine (FLEXERIL) 10 MG tablet Take 10 mg daily as needed by mouth.  0  . diclofenac sodium (VOLTAREN) 1 % GEL Apply 4 g topically 4 (four) times daily. Right medial knee 100 g 2  . Icosapent Ethyl (VASCEPA) 1 g CAPS Take 2 g 2 (two) times daily by mouth. 120 capsule 2  . pantoprazole (PROTONIX) 40 MG tablet Take 1 tablet (40 mg total) by mouth daily. 30 tablet 5  . PARoxetine (PAXIL) 40 MG tablet Take 40 mg daily by mouth.  0  . topiramate (TOPAMAX) 50 MG tablet Take 1 tablet (50 mg total) by mouth daily. 30 tablet 0  . zolpidem (AMBIEN) 10 MG tablet Take 1 tablet at bedtime as needed by mouth.     No current facility-administered medications for this visit.     Allergies as of 01/13/2017 - Review Complete 01/08/2017  Allergen Reaction Noted  . Azithromycin Diarrhea 08/14/2014  . Penicillins Hives and Swelling 01/18/2012    ROS:  General: Negative for anorexia, weight loss, fever,  chills, fatigue, weakness. ENT: Negative for hoarseness, difficulty swallowing , nasal congestion. CV: Negative for chest pain, angina, palpitations, dyspnea on exertion, peripheral edema.  Respiratory: Negative for dyspnea at rest, dyspnea on exertion, cough, sputum, wheezing.  GI: See history of present illness. GU:  Negative for dysuria, hematuria, urinary incontinence, urinary frequency, nocturnal urination.  Endo: Negative for unusual weight change.    Physical Examination:   LMP 01/15/2015 (Exact Date)   General: Well-nourished, well-developed in no acute distress.  Eyes: No icterus. Conjunctivae pink. Mouth: Oropharyngeal mucosa moist and pink , no lesions erythema or exudate. Lungs: Clear to auscultation  bilaterally. Non-labored. Heart: Regular rate and rhythm, no murmurs rubs or gallops.  Abdomen: Bowel sounds are normal, nontender, nondistended, no hepatosplenomegaly or masses, no abdominal bruits or hernia , no rebound or guarding.   Extremities: No lower extremity edema. No clubbing or deformities. Neuro: Alert and oriented x 3.  Grossly intact. Skin: Warm and dry, no jaundice.   Psych: Alert and cooperative, normal [name] and affect.   Imaging Studies: Ct Angio Head W Or Wo Contrast  Result Date: 12/19/2016 CLINICAL DATA:  Ataxia.  Acute infarct demonstrated on earlier MRI. EXAM: CT ANGIOGRAPHY HEAD AND NECK TECHNIQUE: Multidetector CT imaging of the head and neck was performed using the standard protocol during bolus administration of intravenous contrast. Multiplanar CT image reconstructions and MIPs were obtained to evaluate the vascular anatomy. Carotid stenosis measurements (when applicable) are obtained utilizing NASCET criteria, using the distal internal carotid diameter as the denominator. CONTRAST:  50 mL Isovue 370 COMPARISON:  Brain MRI 12/19/2016 FINDINGS: CT HEAD FINDINGS Brain: No mass lesion, intraparenchymal hemorrhage or extra-axial collection. No evidence of acute cortical infarct. Brain parenchyma and CSF-containing spaces are normal for age. Skull: Normal visualized skull base, calvarium and extracranial soft tissues. Sinuses/Orbits: No sinus fluid levels or advanced mucosal thickening. No mastoid effusion. Normal orbits. Review of the MIP images confirms the above findings CTA NECK FINDINGS Aortic arch: There is no calcific atherosclerosis of the aortic arch. There is no aneurysm, dissection or hemodynamically significant stenosis of the visualized ascending aorta and aortic arch. Conventional 3 vessel aortic branching pattern. The visualized proximal subclavian arteries are normal. Right carotid system: The right common carotid origin is widely patent. There is no common carotid  or internal carotid artery dissection or aneurysm. No hemodynamically significant stenosis. Left carotid system: The left common carotid origin is widely patent. There is no common carotid or internal carotid artery dissection or aneurysm. No hemodynamically significant stenosis. Vertebral arteries: The vertebral system is right dominant. Both vertebral artery origins are normal. Both vertebral arteries are normal to their confluence with the basilar artery. Skeleton: There is no bony spinal canal stenosis. No lytic or blastic lesions. Other neck: The nasopharynx is clear. The oropharynx and hypopharynx are normal. The epiglottis is normal. The supraglottic larynx, glottis and subglottic larynx are normal. No retropharyngeal collection. The parapharyngeal spaces are preserved. The parotid and submandibular glands are normal. No sialolithiasis or salivary ductal dilatation. The thyroid gland is normal. There is no cervical lymphadenopathy. Upper chest: No pneumothorax or pleural effusion. No nodules or masses. Review of the MIP images confirms the above findings CTA HEAD FINDINGS Anterior circulation: --Intracranial internal carotid arteries: Normal. --Anterior cerebral arteries: Normal. --Middle cerebral arteries: Normal. --Posterior communicating arteries: Absent bilaterally. Posterior circulation: --Posterior cerebral arteries: Normal. --Superior cerebellar arteries: Normal. --Basilar artery: Normal. --Anterior inferior cerebellar arteries: Not visualized, which is not uncommon. --Posterior inferior cerebellar arteries: There is attenuated  opacification of the left PICA mid to distal portion. Venous sinuses: As permitted by contrast timing, patent. Anatomic variants: None Delayed phase: No parenchymal contrast enhancement. Review of the MIP images confirms the above findings IMPRESSION: 1. No emergent large vessel occlusion. Normal major intracranial arteries. 2. Asymmetrically attenuated opacification of the  mid-to-distal left PICA relative to the right, which is the artery supplying the vascular territory of the cerebellar infarcts seen on MRI. However, assessment of distal portions of the small posterior fossa arteries is not entirely reliable and this may be only normal variation. 3. Normal CTA of the neck. Electronically Signed   By: Deatra Robinson M.D.   On: 12/19/2016 06:45   Ct Angio Neck W Or Wo Contrast  Result Date: 12/19/2016 CLINICAL DATA:  Ataxia.  Acute infarct demonstrated on earlier MRI. EXAM: CT ANGIOGRAPHY HEAD AND NECK TECHNIQUE: Multidetector CT imaging of the head and neck was performed using the standard protocol during bolus administration of intravenous contrast. Multiplanar CT image reconstructions and MIPs were obtained to evaluate the vascular anatomy. Carotid stenosis measurements (when applicable) are obtained utilizing NASCET criteria, using the distal internal carotid diameter as the denominator. CONTRAST:  50 mL Isovue 370 COMPARISON:  Brain MRI 12/19/2016 FINDINGS: CT HEAD FINDINGS Brain: No mass lesion, intraparenchymal hemorrhage or extra-axial collection. No evidence of acute cortical infarct. Brain parenchyma and CSF-containing spaces are normal for age. Skull: Normal visualized skull base, calvarium and extracranial soft tissues. Sinuses/Orbits: No sinus fluid levels or advanced mucosal thickening. No mastoid effusion. Normal orbits. Review of the MIP images confirms the above findings CTA NECK FINDINGS Aortic arch: There is no calcific atherosclerosis of the aortic arch. There is no aneurysm, dissection or hemodynamically significant stenosis of the visualized ascending aorta and aortic arch. Conventional 3 vessel aortic branching pattern. The visualized proximal subclavian arteries are normal. Right carotid system: The right common carotid origin is widely patent. There is no common carotid or internal carotid artery dissection or aneurysm. No hemodynamically significant  stenosis. Left carotid system: The left common carotid origin is widely patent. There is no common carotid or internal carotid artery dissection or aneurysm. No hemodynamically significant stenosis. Vertebral arteries: The vertebral system is right dominant. Both vertebral artery origins are normal. Both vertebral arteries are normal to their confluence with the basilar artery. Skeleton: There is no bony spinal canal stenosis. No lytic or blastic lesions. Other neck: The nasopharynx is clear. The oropharynx and hypopharynx are normal. The epiglottis is normal. The supraglottic larynx, glottis and subglottic larynx are normal. No retropharyngeal collection. The parapharyngeal spaces are preserved. The parotid and submandibular glands are normal. No sialolithiasis or salivary ductal dilatation. The thyroid gland is normal. There is no cervical lymphadenopathy. Upper chest: No pneumothorax or pleural effusion. No nodules or masses. Review of the MIP images confirms the above findings CTA HEAD FINDINGS Anterior circulation: --Intracranial internal carotid arteries: Normal. --Anterior cerebral arteries: Normal. --Middle cerebral arteries: Normal. --Posterior communicating arteries: Absent bilaterally. Posterior circulation: --Posterior cerebral arteries: Normal. --Superior cerebellar arteries: Normal. --Basilar artery: Normal. --Anterior inferior cerebellar arteries: Not visualized, which is not uncommon. --Posterior inferior cerebellar arteries: There is attenuated opacification of the left PICA mid to distal portion. Venous sinuses: As permitted by contrast timing, patent. Anatomic variants: None Delayed phase: No parenchymal contrast enhancement. Review of the MIP images confirms the above findings IMPRESSION: 1. No emergent large vessel occlusion. Normal major intracranial arteries. 2. Asymmetrically attenuated opacification of the mid-to-distal left PICA relative to the right, which is  the artery supplying the  vascular territory of the cerebellar infarcts seen on MRI. However, assessment of distal portions of the small posterior fossa arteries is not entirely reliable and this may be only normal variation. 3. Normal CTA of the neck. Electronically Signed   By: Deatra Robinson M.D.   On: 12/19/2016 06:45   Mr Brain Wo Contrast  Result Date: 12/19/2016 CLINICAL DATA:  Ataxia EXAM: MRI HEAD WITHOUT CONTRAST TECHNIQUE: Multiplanar, multiecho pulse sequences of the brain and surrounding structures were obtained without intravenous contrast. COMPARISON:  Brain MRI 01/20/2016 FINDINGS: Brain: The midline structures are normal. There is diffusion restriction within the medial left cerebellum and is in the left occipital lobe. No mass lesion, hydrocephalus, dural abnormality or extra-axial collection. There is hyperintense T2-weighted signal at the sites of ischemia but otherwise the parenchymal signal of the brain is normal. No age-advanced or lobar predominant atrophy. No chronic microhemorrhage or superficial siderosis. Vascular: Major intracranial arterial and venous sinus flow voids are preserved. Skull and upper cervical spine: The visualized skull base, calvarium, upper cervical spine and extracranial soft tissues are normal. Sinuses/Orbits: No fluid levels or advanced mucosal thickening. No mastoid or middle ear effusion. Normal orbits. IMPRESSION: 1. Multifocal acute ischemia within the left posterior circulation, including the medial left cerebellum and left occipital lobe. No contralateral for anterior circulation ischemia. 2. No hemorrhage or mass effect. Electronically Signed   By: Deatra Robinson M.D.   On: 12/19/2016 03:18   Dg Abd Acute W/chest  Result Date: 12/19/2016 CLINICAL DATA:  46 year old female with abdominal pain. Recent endoscopy. EXAM: DG ABDOMEN ACUTE W/ 1V CHEST COMPARISON:  Abdominal ultrasound dated 11/13/2012 FINDINGS: The lungs are clear. Minimal bibasilar atelectatic changes/ scarring noted.  There is no focal consolidation, pleural effusion, or pneumothorax. The cardiac silhouette is within normal limits. There is moderate stool throughout the colon. No bowel dilatation or evidence of obstruction. No free air or radiopaque calculi. The osseous structures and soft tissues are grossly unremarkable. IMPRESSION: 1. No acute cardiopulmonary process. 2. No bowel obstruction.  No free air. Electronically Signed   By: Elgie Collard M.D.   On: 12/19/2016 01:12    Assessment and Plan:   Alicia [name] is a 46 y.o. y/o female here to follow up  for GERD .  Symptoms well controlled on PPI. Counseled her on lifestyle changes.   1. Continue  Protonix 40 mg once daily  2. GERD patient information   Dr Alicia Mood  MD,MRCP [facility]) Follow up in 6 months to decrease dose of PPI

## 2017-01-20 ENCOUNTER — Other Ambulatory Visit: Payer: Self-pay | Admitting: Orthopedic Surgery

## 2017-01-20 ENCOUNTER — Ambulatory Visit (INDEPENDENT_AMBULATORY_CARE_PROVIDER_SITE_OTHER): Payer: BLUE CROSS/BLUE SHIELD | Admitting: *Deleted

## 2017-01-20 ENCOUNTER — Ambulatory Visit
Admission: RE | Admit: 2017-01-20 | Discharge: 2017-01-20 | Disposition: A | Discharge status: Home / Self Care | Payer: BLUE CROSS/BLUE SHIELD | Source: Ambulatory Visit | Attending: Orthopedic Surgery | Admitting: Orthopedic Surgery

## 2017-01-20 DIAGNOSIS — M79661 Pain in right lower leg: Secondary | ICD-10-CM | POA: Diagnosis not present

## 2017-01-20 DIAGNOSIS — I639 Cerebral infarction, unspecified: Secondary | ICD-10-CM | POA: Diagnosis not present

## 2017-01-20 DIAGNOSIS — R600 Localized edema: Secondary | ICD-10-CM | POA: Diagnosis not present

## 2017-01-20 DIAGNOSIS — M25561 Pain in right knee: Secondary | ICD-10-CM | POA: Diagnosis not present

## 2017-01-20 NOTE — Progress Notes (Signed)
[facility] Summary Report / Loop Recorder 

## 2017-01-21 DIAGNOSIS — M25561 Pain in right knee: Secondary | ICD-10-CM | POA: Insufficient documentation

## 2017-01-23 ENCOUNTER — Encounter: Payer: Self-pay | Admitting: Neurology

## 2017-01-23 ENCOUNTER — Ambulatory Visit: Payer: BLUE CROSS/BLUE SHIELD | Admitting: Neurology

## 2017-01-23 VITALS — HR 65 | Ht 68.0 in | Wt 177.4 lb

## 2017-01-23 DIAGNOSIS — I639 Cerebral infarction, unspecified: Secondary | ICD-10-CM

## 2017-01-23 MED ORDER — ASPIRIN EC 81 MG PO TBEC: 81.0000 mg | DELAYED_RELEASE_TABLET | Freq: Every day | ORAL | 1 refills | Status: DC

## 2017-01-23 MED ORDER — ROSUVASTATIN CALCIUM 5 MG PO TABS: 2.5000 mg | ORAL_TABLET | Freq: Every day | ORAL | 3 refills | Status: DC

## 2017-01-23 MED ORDER — COENZYME Q10 30 MG PO CAPS: 200.0000 mg | ORAL_CAPSULE | Freq: Every day | ORAL | 3 refills | Status: DC

## 2017-01-23 MED ORDER — COENZYME Q10 30 MG PO CAPS: 200.0000 mg | ORAL_CAPSULE | Freq: Three times a day (TID) | ORAL | 3 refills | Status: DC

## 2017-01-23 NOTE — Progress Notes (Signed)
[facility] [address] [place]. [place] [address] [phone]       OFFICE FOLLOW-UP NOTE  [name] Note Alicia [name] Date of Birth:  1971-01-13 Medical Record Number:  [id number]   HPI: [name] is a 49 year Caucasian lady seen today for first office follow-up visit following hospital admission for stroke in November 2018. She is accompanied by her husband. History is obtained from them as well as review of electronic medical records. I personally reviewed imaging films.  Alicia [name] Note Scruggs is a 46 y.o. female who presents with dizziness that started sometime between 85 and 10 PM. On 12/18/2016  She states that she was sitting watching TV, and its of the became hard to see things because "everything kept Moving."  She then came into the emergency department and because of her symptoms an MRI was obtained which shows a small cerebellar infarct.Of note, she was able to stand up and walk to the bedside commode earlier and felt like she is able to do, she did have some dizziness. LKW: 9pm on 12/18/16.tpa given?: no, mild symptoms. Transthoracic echo showed normal ejection fraction. Transesophageal echo showed no cardiac source of  embolism orPFO. Lower extremity venous Dopplers were negative for DVT. ANA was negative. ESR was 7 mm. antiphospholipid   antibodies were negative. LDL cholesterol was 117 mg percent and hemoglobin A1c was 5.5. She had 48 hours of continuous telemetry monitoring which I reviewed and did not show any significant cardiac arrhythmias. Patient was started on Lipitor 80 mg and aspirin 325 mg. She states she still has some problems with blurred vision and depth perception as well as coordination particularly when she turns her head suddenly. She actually developed significant pain in her right calf and leg about a week after discharge. She has seen her primary care physician who gave her some muscle relaxant did not help. She has seen orthopedics surgeon  Alicia [name] x-rays of the knee which were unremarkable. His prescribed tramadol but she has not started it yet. She states her biggest problem right now is a difficulty walking as she she puts weight on her leg the pain gets worse. At times she has difficult day in sleeping at night. The patient also had trouble tolerating aspirin 325 due to nausea and hence has decreased the dose. She had loop recorder inserted and so for paroxysmal atrial fibrillation has not yet been found. The patient could not tolerate Lipitor due to cramps and diarrhea and stopped it 2 weeks ago. She has no prior history of deep vein thrombosis, pulmonary embolism. She had history of 2 miscarriages but antiphospholipid antibody test was negative. She does not smoke or do drugs. She is not on birth control pills and has no history of migraines. There is no history in the family of strokes or heart attacks at a young age.     ROS:   14 system review of systems is positive for  right calf and leg pain. Chills, fatigue, light sensitive, blurred vision, diarrhea, nausea, dizziness, headache, seizure, tremors, passing out, walking difficulty, joint pain and all other systems negative  PMH:  Past Medical History:  Diagnosis Date  . Anemia   . Anxiety   . Depression   . GERD (gastroesophageal reflux disease)   . Hyperlipidemia   . IBS (irritable bowel syndrome)   . Psychogenic nonepileptic seizure    hx/notes 12/19/2016  . Restless leg syndrome   . Stroke ([place]) 12/18/2016   Acute arterial ischemic stroke,  multifocal, posterior circulation Archie Endo 12/19/2016    Social History:  Social History   Socioeconomic History  . Marital status: Married    Spouse name: Alicia [name]   . Number of children: 4  . Years of education: Not on file  . Highest education level: Not on file  Social Needs  . Financial resource strain: Not on file  . Food insecurity - worry: Not on file  . Food insecurity - inability: Not on file  . Transportation  needs - medical: Not on file  . Transportation needs - non-medical: Not on file  Occupational History  . Occupation: Aeronautical engineer: [employer]  Tobacco Use  . Smoking status: Never Smoker  . Smokeless tobacco: Never Used  Substance and Sexual Activity  . Alcohol use: No    Alcohol/week: 0.0 oz  . Drug use: No  . Sexual activity: Yes    Partners: Male  Other Topics Concern  . Not on file  Social History Narrative   First husband died suddenly in Apr 11, 2014   Remarried 06/07/2016    Medications:   Current Outpatient Medications on File Prior to Visit  Medication Sig Dispense Refill  . acetaminophen (TYLENOL) 325 MG tablet Take 2 tablets (650 mg total) by mouth every 6 (six) hours as needed for mild pain, moderate pain or headache (or temp > 37.5 C (99.5 F)).    Marland Kitchen ALPRAZOLAM XR 1 MG 24 hr tablet Take 1 tablet daily by mouth.  1  . carvedilol (COREG) 12.5 MG tablet Coreg    . cyclobenzaprine (FLEXERIL) 10 MG tablet Take 10 mg daily as needed by mouth.  0  . diclofenac sodium (VOLTAREN) 1 % GEL Apply 4 g topically 4 (four) times daily. Right medial knee 100 g 2  . Icosapent Ethyl (VASCEPA) 1 g CAPS Take 2 g 2 (two) times daily by mouth. 120 capsule 2  . pantoprazole (PROTONIX) 40 MG tablet Take 1 tablet (40 mg total) by mouth daily. 30 tablet 5  . paroxetine (PAXIL) 10 MG/5ML suspension Paxil    . topiramate (TOPAMAX) 50 MG tablet Take 1 tablet (50 mg total) by mouth daily. 30 tablet 0  . traMADol (ULTRAM) 50 MG tablet take 1 tablet by mouth every 6 hours if needed  0  . zolpidem (AMBIEN) 10 MG tablet Take 1 tablet at bedtime as needed by mouth.     No current facility-administered medications on file prior to visit.     Allergies:   Allergies  Allergen Reactions  . Azithromycin Diarrhea  . Penicillins Hives and Swelling    Has patient had a PCN reaction causing immediate rash, facial/tongue/throat swelling, SOB or lightheadedness with hypotension: Yes Has patient had  a PCN reaction causing severe rash involving mucus membranes or skin necrosis: No Has patient had a PCN reaction that required hospitalization No Has patient had a PCN reaction occurring within the last 10 years: No If all of the above answers are "NO", then may proceed with Cephalosporin use.    Physical Exam General: well developed, well nourished middle-age Caucasian lady, seated, in no evident distress Head: head normocephalic and atraumatic.  Neck: supple with no carotid or supraclavicular bruits Cardiovascular: regular rate and rhythm, no murmurs Musculoskeletal: no deformity Skin:  no rash/petichiae Vascular:  Normal pulses all extremities Vitals:   01/23/17 0920  Pulse: 65   Neurologic Exam Mental Status: Awake and fully alert. Oriented to place and time. Recent and remote memory intact. Attention span, concentration and  fund of knowledge appropriate. Mood and affect appropriate.  Cranial Nerves: Fundoscopic exam reveals sharp disc margins. Pupils equal, briskly reactive to light. Extraocular movements full without nystagmus. Visual fields full to confrontation. Hearing intact. Facial sensation intact. Face, tongue, palate moves normally and symmetrically.  Motor: Normal bulk and tone. Normal strength in all tested extremity muscles except right leg testing limited due to significant leg pain.. Sensory.: intact to touch ,pinprick .position and vibratory sensation.  Coordination: Rapid alternating movements normal in all extremities. Finger-to-nose and heel-to-shin performed accurately bilaterally. Gait and Station: Arises from chair without difficulty. Stance is abnormal due to favoring the right leg. Unable to walk tandem due to pain. Difficulty standing on right leg unsupported due to pain Reflexes: 2+ and symmetric. Toes downgoing.   NIHSS  0 Modified Rankin  1   ASSESSMENT:  29 year Caucasian lady with cryptogenic left cerebellar and occipital infarcts in November 2018.  Vascular risk factors of mild hyperlipidemia only. New complaints of right calf and leg pain of unclear etiology.   PLAN: I had a long d/w patient and her husband about her recent cryptogenic stroke, risk for recurrent stroke/TIAs, personally independently reviewed imaging studies and stroke evaluation results and answered questions.Continue aspirin 81 mg daily  for secondary stroke prevention and maintain strict control of hypertension with blood pressure goal below 130/90, diabetes with hemoglobin A1c goal below 6.5% and lipids with LDL cholesterol goal below 70 mg/dL. I also advised the patient to eat a healthy diet with plenty of whole grains, cereals, fruits and vegetables, exercise regularly and maintain ideal body weight . Start Crestor 2.5 mg daily along with co-enzyme Q10 200 mg daily to minimize statin myalgias. Continue follow-up with her orthopedic doctor for right calf and leg pain. She may return to work part-time after January 3 for 2-4 weeks and then return for a time after that .she may participate in the young [place] stroke registry if interested Followup in the future with my nurse practitioner in 3 months or call earlier if necessary Greater than 50% of time during this 25 minute visit was spent on counseling,explanation of diagnosis of cryptogenic stroke , planning of further management, discussion with patient and family and coordination of care Antony Contras, MD  [facility] [address] [place], [place] [address]  Phone [phone] Fax [phone] Note: This document was prepared with digital dictation and possible smart phrase technology. Any transcriptional errors that result from this process are unintentional

## 2017-01-23 NOTE — Patient Instructions (Signed)
I had a long d/w patient and her husband about her recent cryptogenic stroke, risk for recurrent stroke/TIAs, personally independently reviewed imaging studies and stroke evaluation results and answered questions.Continue aspirin 81 mg daily  for secondary stroke prevention and maintain strict control of hypertension with blood pressure goal below 130/90, diabetes with hemoglobin A1c goal below 6.5% and lipids with LDL cholesterol goal below 70 mg/dL. I also advised the patient to eat a healthy diet with plenty of whole grains, cereals, fruits and vegetables, exercise regularly and maintain ideal body weight . Start Crestor 2.5 mg daily along with co-enzyme Q10 200 mg daily to minimize statin myalgias. Continue follow-up with her orthopedic doctor for right calf and leg pain. She may return to work part-time after January 3 for 2-4 weeks and then return for a time after that .she may participate in the young [facility] stroke registry if interested Followup in the future with my nurse practitioner in 3 months or call earlier if necessary  Stroke Prevention Some medical conditions and behaviors are associated with an increased chance of having a stroke. You may prevent a stroke by making healthy choices and managing medical conditions. How can I reduce my risk of having a stroke?  Stay physically active. Get at least 30 minutes of activity on most or all days.  Do not smoke. It may also be helpful to avoid exposure to secondhand smoke.  Limit alcohol use. Moderate alcohol use is considered to be: ? No more than 2 drinks per day for men. ? No more than 1 drink per day for nonpregnant women.  Eat healthy foods. This involves: ? Eating 5 or more servings of fruits and vegetables a day. ? Making dietary changes that address high blood pressure (hypertension), high cholesterol, diabetes, or obesity.  Manage your cholesterol levels. ? Making food choices that are high in fiber and low in saturated fat, trans  fat, and cholesterol may control cholesterol levels. ? Take any prescribed medicines to control cholesterol as directed by your health care provider.  Manage your diabetes. ? Controlling your carbohydrate and sugar intake is recommended to manage diabetes. ? Take any prescribed medicines to control diabetes as directed by your health care provider.  Control your hypertension. ? Making food choices that are low in salt (sodium), saturated fat, trans fat, and cholesterol is recommended to manage hypertension. ? Ask your health care provider if you need treatment to lower your blood pressure. Take any prescribed medicines to control hypertension as directed by your health care provider. ? If you are 7618-46 years of age, have your blood pressure checked every 3-5 years. If you are 440 years of age or older, have your blood pressure checked every year.  Maintain a healthy weight. ? Reducing calorie intake and making food choices that are low in sodium, saturated fat, trans fat, and cholesterol are recommended to manage weight.  Stop drug abuse.  Avoid taking birth control pills. ? Talk to your health care provider about the risks of taking birth control pills if you are over 46 years old, smoke, get migraines, or have ever had a blood clot.  Get evaluated for sleep disorders (sleep apnea). ? Talk to your health care provider about getting a sleep evaluation if you snore a lot or have excessive sleepiness.  Take medicines only as directed by your health care provider. ? For some people, aspirin or blood thinners (anticoagulants) are helpful in reducing the risk of forming abnormal blood clots that can lead  to stroke. If you have the irregular heart rhythm of atrial fibrillation, you should be on a blood thinner unless there is a good reason you cannot take them. ? Understand all your medicine instructions.  Make sure that other conditions (such as anemia or atherosclerosis) are addressed. Get  help right away if:  You have sudden weakness or numbness of the face, arm, or leg, especially on one side of the body.  Your face or eyelid droops to one side.  You have sudden confusion.  You have trouble speaking (aphasia) or understanding.  You have sudden trouble seeing in one or both eyes.  You have sudden trouble walking.  You have dizziness.  You have a loss of balance or coordination.  You have a sudden, severe headache with no known cause.  You have new chest pain or an irregular heartbeat. Any of these symptoms may represent a serious problem that is an emergency. Do not wait to see if the symptoms will go away. Get medical help at once. Call your local emergency services (911 in [place] Do not drive yourself to the hospital. This information is not intended to replace advice given to you by your health care provider. Make sure you discuss any questions you have with your health care provider. Document Released: 03/14/2004 Document Revised: 07/13/2015 Document Reviewed: 08/07/2012 Elsevier Interactive Patient Education  2017 [employer]

## 2017-01-24 ENCOUNTER — Telehealth: Payer: Self-pay | Admitting: Neurology

## 2017-01-24 NOTE — Telephone Encounter (Signed)
Rn call back about her topamax. Rn stated her PCP is prescribing her topamax now as of 12/2016. Pt stated Dr. Pearlean BrownieSEthi prescribed the med in the hospital. Rn stated per Dr. Pearlean BrownieSEthi she can continue the topamax daily as she has been taking it. Pt reported no side effects from the med. She is taking it at night. Pt ask if Dr. Pearlean BrownieSethi wants her PCP to be consulted on it. Rn stated the PCP should be consulted for any issues if she is having any side effects. Rn also stated she can call Dr. Pearlean BrownieSethi if she has more concerns. Pt verbalized understanding.  Rn also remind pt her crestor is at her pharmacy. Pt verbalized understanding. Rn stated she is to take crestor 2.5 mg daily. Pt verbalized understanding.

## 2017-01-24 NOTE — Telephone Encounter (Signed)
Patient was seen by Dr. Pearlean BrownieSethi yesterday and forgot to ask if she should continue taking topiramate (TOPAMAX) 50 MG tablet.

## 2017-01-28 LAB — CUP PACEART REMOTE DEVICE CHECK
Implantable Pulse Generator Implant Date: 20181103
MDC IDC SESS DTM: 20181202170617

## 2017-01-29 ENCOUNTER — Other Ambulatory Visit: Payer: Self-pay | Admitting: Orthopedic Surgery

## 2017-01-29 DIAGNOSIS — M25561 Pain in right knee: Secondary | ICD-10-CM

## 2017-01-30 ENCOUNTER — Other Ambulatory Visit: Payer: Self-pay

## 2017-01-30 NOTE — Telephone Encounter (Signed)
Refill request for general medication: Ambien 10 mg  Last office visit: 01/08/2017  Last physical exam: None indicated  Follow up visit: 03/06/2017

## 2017-01-31 ENCOUNTER — Ambulatory Visit
Admission: RE | Admit: 2017-01-31 | Discharge: 2017-01-31 | Disposition: A | Discharge status: Home / Self Care | Payer: BLUE CROSS/BLUE SHIELD | Source: Ambulatory Visit | Attending: Orthopedic Surgery | Admitting: Orthopedic Surgery

## 2017-01-31 DIAGNOSIS — X58XXXA Exposure to other specified factors, initial encounter: Secondary | ICD-10-CM | POA: Diagnosis not present

## 2017-01-31 DIAGNOSIS — M25561 Pain in right knee: Secondary | ICD-10-CM | POA: Diagnosis not present

## 2017-01-31 DIAGNOSIS — S83241A Other tear of medial meniscus, current injury, right knee, initial encounter: Secondary | ICD-10-CM | POA: Insufficient documentation

## 2017-01-31 DIAGNOSIS — M25461 Effusion, right knee: Secondary | ICD-10-CM | POA: Diagnosis not present

## 2017-01-31 MED ORDER — ZOLPIDEM TARTRATE 10 MG PO TABS: 10.0000 mg | ORAL_TABLET | Freq: Every evening | ORAL | 0 refills | Status: DC | PRN

## 2017-02-03 ENCOUNTER — Other Ambulatory Visit: Payer: Self-pay | Admitting: Family Medicine

## 2017-02-04 DIAGNOSIS — M25561 Pain in right knee: Secondary | ICD-10-CM | POA: Diagnosis not present

## 2017-02-19 ENCOUNTER — Ambulatory Visit (INDEPENDENT_AMBULATORY_CARE_PROVIDER_SITE_OTHER): Payer: BLUE CROSS/BLUE SHIELD | Admitting: *Deleted

## 2017-02-19 DIAGNOSIS — I639 Cerebral infarction, unspecified: Secondary | ICD-10-CM | POA: Diagnosis not present

## 2017-02-20 NOTE — Progress Notes (Signed)
[facility] Summary Report / Loop Recorder 

## 2017-02-21 ENCOUNTER — Encounter: Payer: Self-pay | Admitting: Family Medicine

## 2017-02-21 ENCOUNTER — Encounter: Payer: Self-pay | Admitting: Neurology

## 2017-02-25 ENCOUNTER — Ambulatory Visit: Payer: BLUE CROSS/BLUE SHIELD | Admitting: Family Medicine

## 2017-02-25 ENCOUNTER — Encounter: Payer: Self-pay | Admitting: Family Medicine

## 2017-02-25 VITALS — BP 90/70 | HR 105 | Resp 14 | Ht 68.0 in | Wt 170.5 lb

## 2017-02-25 DIAGNOSIS — I63549 Cerebral infarction due to unspecified occlusion or stenosis of unspecified cerebellar artery: Secondary | ICD-10-CM

## 2017-02-25 DIAGNOSIS — F411 Generalized anxiety disorder: Secondary | ICD-10-CM | POA: Diagnosis not present

## 2017-02-25 DIAGNOSIS — K219 Gastro-esophageal reflux disease without esophagitis: Secondary | ICD-10-CM | POA: Diagnosis not present

## 2017-02-25 DIAGNOSIS — R000 Tachycardia, unspecified: Secondary | ICD-10-CM | POA: Diagnosis not present

## 2017-02-25 DIAGNOSIS — G8929 Other chronic pain: Secondary | ICD-10-CM

## 2017-02-25 DIAGNOSIS — R269 Unspecified abnormalities of gait and mobility: Secondary | ICD-10-CM | POA: Diagnosis not present

## 2017-02-25 DIAGNOSIS — R569 Unspecified convulsions: Secondary | ICD-10-CM

## 2017-02-25 DIAGNOSIS — M542 Cervicalgia: Secondary | ICD-10-CM

## 2017-02-25 DIAGNOSIS — F5104 Psychophysiologic insomnia: Secondary | ICD-10-CM

## 2017-02-25 DIAGNOSIS — I69398 Other sequelae of cerebral infarction: Secondary | ICD-10-CM | POA: Diagnosis not present

## 2017-02-25 MED ORDER — CARVEDILOL 12.5 MG PO TABS: 12.5000 mg | ORAL_TABLET | Freq: Two times a day (BID) | ORAL | 0 refills | Status: DC

## 2017-02-25 MED ORDER — CYCLOBENZAPRINE HCL 10 MG PO TABS: 10.0000 mg | ORAL_TABLET | Freq: Every day | ORAL | 1 refills | Status: DC | PRN

## 2017-02-25 MED ORDER — ALPRAZOLAM XR 1 MG PO TB24: 1.0000 mg | ORAL_TABLET | Freq: Every day | ORAL | 2 refills | Status: DC

## 2017-02-25 MED ORDER — PAROXETINE HCL 40 MG PO TABS: 40.0000 mg | ORAL_TABLET | ORAL | 1 refills | Status: DC

## 2017-02-25 MED ORDER — ZOLPIDEM TARTRATE 10 MG PO TABS: 10.0000 mg | ORAL_TABLET | Freq: Every evening | ORAL | 0 refills | Status: DC | PRN

## 2017-02-25 MED ORDER — TOPIRAMATE 50 MG PO TABS: 50.0000 mg | ORAL_TABLET | Freq: Every evening | ORAL | 0 refills | Status: DC

## 2017-02-25 MED ORDER — PANTOPRAZOLE SODIUM 40 MG PO TBEC: 40.0000 mg | DELAYED_RELEASE_TABLET | Freq: Every day | ORAL | 1 refills | Status: DC

## 2017-02-25 NOTE — Addendum Note (Signed)
Addended by: Tommie RaymondBOOKER, CRYSTAL L on: 02/25/2017 01:45 PM   Modules accepted: Orders
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
## 2017-02-25 NOTE — Addendum Note (Signed)
Addended by: [name], Adreana Coull F on: 02/25/2017 01:45 PM   Modules accepted: Orders

## 2017-02-26 ENCOUNTER — Encounter: Payer: Self-pay | Admitting: Family Medicine

## 2017-02-26 ENCOUNTER — Encounter: Payer: Self-pay | Admitting: Neurology

## 2017-03-04 ENCOUNTER — Encounter: Payer: Self-pay | Admitting: Family Medicine

## 2017-03-04 LAB — CUP PACEART REMOTE DEVICE CHECK
Date Time Interrogation Session: 20190101170523
MDC IDC PG IMPLANT DT: 20181103

## 2017-03-06 ENCOUNTER — Telehealth: Payer: Self-pay

## 2017-03-06 ENCOUNTER — Ambulatory Visit: Payer: Self-pay | Admitting: Family Medicine

## 2017-03-06 NOTE — Telephone Encounter (Signed)
Micki RileySethi, Pramod S, MD      03/06/17 9:53 AM  Note    I spoke to Alicia [name] at [phone] from human resources at [employer] upon request from Alicia [name] after she had signed a release form about her job. We discussed her job requirements. I think it's reasonable to make job accommodations for a few weeks so that patient can ease into her previous job and in a month or so she should be able to return to doing most of her job responsibilities. Patient will start limited hours initially and then increase her hours and job responsibilities gradually as tolerated .If patient is not able to perform her job in about a month or so she may need to look for alternative employment. Patient had requested that she not be working alone given history of seizures. I reviewed the patient's chart and found this not to be accurate She had a diagnosis of nonepileptic spells and was admitted in December 2017 and had video EEG recording of these episodes.

## 2017-03-06 NOTE — Telephone Encounter (Signed)
Dr. Pearlean BrownieSethi call Kary KosCarla [place], at [phone], pts employer in human resources. Dr. Pearlean BrownieSethi vm was left to call back.

## 2017-03-06 NOTE — Telephone Encounter (Signed)
Error wrong  Office.

## 2017-03-06 NOTE — Telephone Encounter (Signed)
Left a message to call back at 12:30 p.m. 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 Kary KosCarla [place] contact person [employer] of [employer] [phone].

## 2017-03-06 NOTE — Telephone Encounter (Deleted)
I spoke to Alicia [name] at [phone] from human resources at [employer] upon request from Alicia HalstedDorothy [name] after she had signed a release form about her job. We discussed her job requirements. I think it's reasonable to make job accommodations for a few weeks so that patient can ease into her previous job and in a month or so she should be able to return to doing most of her job responsibilities. Patient will start limited hours initially and then increase her hours and job responsibilities gradually as tolerated .If patient is not able to perform her job in about a month or so she may need to look for alternative employment. Patient had requested that she not be working alone given history of seizures. I reviewed the patient's chart and found this not to be accurate She had a diagnosis of nonepileptic spells and was admitted in December 2017 and had video EEG recording of these episodes.

## 2017-03-07 ENCOUNTER — Telehealth: Payer: Self-pay

## 2017-03-07 NOTE — Telephone Encounter (Signed)
Copied from CRM [phone]. Topic: General - Call Back - No Documentation >> Mar 06, 2017  9:32 AM Phineas SemenJohnson, Debbora Ang, CMA wrote: Reason for CRM:  Per patient request we contacted the Kary Kosarla [place] (Office of [employer]) with [employer].  Our left a message for Albin FellingCarla to call Dr. Carlynn PurlSowles @ 12:30pm today in order to clarify the work restrictions for patient Rayford HalstedDorothy Mahler.  >> Mar 06, 2017  1:49 PM Landry MellowFoltz, Melissa J wrote: [phone] Albin FellingCarla returned your call

## 2017-03-12 NOTE — Telephone Encounter (Signed)
Called and left a message for Alicia [name] to call Dr. Carlynn PurlSowles back in regards to Mrs. Alicia [name].

## 2017-03-20 ENCOUNTER — Ambulatory Visit (INDEPENDENT_AMBULATORY_CARE_PROVIDER_SITE_OTHER): Payer: BLUE CROSS/BLUE SHIELD | Admitting: *Deleted

## 2017-03-20 DIAGNOSIS — I639 Cerebral infarction, unspecified: Secondary | ICD-10-CM | POA: Diagnosis not present

## 2017-03-21 NOTE — Progress Notes (Signed)
[facility] Summary Report / Loop Recorder 

## 2017-03-25 DIAGNOSIS — S83241A Other tear of medial meniscus, current injury, right knee, initial encounter: Secondary | ICD-10-CM | POA: Diagnosis not present

## 2017-03-25 DIAGNOSIS — M25561 Pain in right knee: Secondary | ICD-10-CM | POA: Diagnosis not present

## 2017-04-01 LAB — CUP PACEART REMOTE DEVICE CHECK
Date Time Interrogation Session: 20190131173538
MDC IDC PG IMPLANT DT: 20181103

## 2017-04-03 ENCOUNTER — Other Ambulatory Visit: Payer: Self-pay | Admitting: Family Medicine

## 2017-04-03 DIAGNOSIS — R000 Tachycardia, unspecified: Secondary | ICD-10-CM

## 2017-04-14 DIAGNOSIS — J111 Influenza due to unidentified influenza virus with other respiratory manifestations: Secondary | ICD-10-CM | POA: Diagnosis not present

## 2017-04-22 ENCOUNTER — Ambulatory Visit (INDEPENDENT_AMBULATORY_CARE_PROVIDER_SITE_OTHER): Payer: BLUE CROSS/BLUE SHIELD | Admitting: *Deleted

## 2017-04-22 DIAGNOSIS — I639 Cerebral infarction, unspecified: Secondary | ICD-10-CM

## 2017-04-23 NOTE — Progress Notes (Signed)
[facility] Summary Report / Loop Recorder 

## 2017-04-24 NOTE — Progress Notes (Deleted)
[facility]  PATIENT: Alicia [name] Note Weingartner DOB: 03-May-1970   REASON FOR VISIT: Follow-up for stroke HISTORY FROM:    HISTORY OF PRESENT ILLNESS: 01/23/17 PSMs. Alicia [name] is a 36 year Caucasian lady seen today for first office follow-up visit following hospital admission for stroke in November 2018. She is accompanied by her husband. History is obtained from them as well as review of electronic medical records. I personally reviewed imaging films. Alicia [name] Note [name] a 46 y.o.femalewho presents with dizziness that started sometime between 9 and 10 PM. On 10/31/2018She states that she was sitting watching TV, and its of the became hard to see things because "everything kept Moving."She then came into the emergency department and because of her symptoms an MRI was obtained which shows a small cerebellar infarct.Of note, she was able to stand up and walk to the bedside commode earlier and felt like she is able to do,she did have some dizziness. LKW:9pm on 12/18/16.tpa given?: no,mild symptoms. Transthoracic echo showed normal ejection fraction. Transesophageal echo showed no cardiac source of  embolism orPFO. Lower extremity venous Dopplers were negative for DVT. ANA was negative. ESR was 7 mm. antiphospholipid   antibodies were negative. LDL cholesterol was 117 mg percent and hemoglobin A1c was 5.5. She had 48 hours of continuous telemetry monitoring which I reviewed and did not show any significant cardiac arrhythmias. Patient was started on Lipitor 80 mg and aspirin 325 mg. She states she still has some problems with blurred vision and depth perception as well as coordination particularly when she turns her head suddenly. She actually developed significant pain in her right calf and leg about a week after discharge. She has seen her primary care physician who gave her some muscle relaxant did not help. She has seen orthopedics surgeon [place] x-rays of the knee which  were unremarkable. His prescribed tramadol but she has not started it yet. She states her biggest problem right now is a difficulty walking as she she puts weight on her leg the pain gets worse. At times she has difficult day in sleeping at night. The patient also had trouble tolerating aspirin 325 due to nausea and hence has decreased the dose. She had loop recorder inserted and so for paroxysmal atrial fibrillation has not yet been found. The patient could not tolerate Lipitor due to cramps and diarrhea and stopped it 2 weeks ago. She has no prior history of deep vein thrombosis, pulmonary embolism. She had history of 2 miscarriages but antiphospholipid antibody test was negative. She does not smoke or do drugs. She is not on birth control pills and has no history of migraines. There is no history in the family of strokes or heart attacks at a young age.    REVIEW OF SYSTEMS: Full 14 system review of systems performed and notable only for those listed, all others are neg:  Constitutional: neg  Cardiovascular: neg Ear/Nose/Throat: neg  Skin: neg Eyes: neg Respiratory: neg Gastroitestinal: neg  Hematology/Lymphatic: neg  Endocrine: neg Musculoskeletal:neg Allergy/Immunology: neg Neurological: neg Psychiatric: neg Sleep : neg   ALLERGIES: Allergies  Allergen Reactions  . Azithromycin Diarrhea  . Penicillins Hives and Swelling    Has patient had a PCN reaction causing immediate rash, facial/tongue/throat swelling, SOB or lightheadedness with hypotension: Yes Has patient had a PCN reaction causing severe rash involving mucus membranes or skin necrosis: No Has patient had a PCN reaction that required hospitalization No Has patient had a PCN reaction occurring within the last 10  years: No If all of the above answers are "NO", then may proceed with Cephalosporin use.    HOME MEDICATIONS: Outpatient Medications Prior to Visit  Medication Sig Dispense Refill  . ALPRAZOLAM XR 1 MG 24 hr  tablet Take 1 tablet (1 mg total) by mouth daily. 30 tablet 2  . aspirin EC 81 MG tablet Take 1 tablet (81 mg total) by mouth daily. 1 tablet 1  . carvedilol (COREG) 12.5 MG tablet TAKE 1 TABLET BY MOUTH TWICE DAILY WITH MEALS 180 tablet 0  . co-enzyme Q-10 30 MG capsule Take 7 capsules (210 mg total) by mouth daily. 60 capsule 3  . cyclobenzaprine (FLEXERIL) 10 MG tablet Take 1 tablet (10 mg total) by mouth daily as needed. 90 tablet 1  . diclofenac sodium (VOLTAREN) 1 % GEL Apply 4 g topically 4 (four) times daily. Right medial knee 100 g 2  . Icosapent Ethyl (VASCEPA) 1 g CAPS Take 2 g 2 (two) times daily by mouth. 120 capsule 2  . pantoprazole (PROTONIX) 40 MG tablet Take 1 tablet (40 mg total) by mouth daily. 90 tablet 1  . PARoxetine (PAXIL) 40 MG tablet Take 1 tablet (40 mg total) by mouth every morning. 90 tablet 1  . rosuvastatin (CRESTOR) 5 MG tablet Take 0.5 tablets (2.5 mg total) by mouth daily. 30 tablet 3  . topiramate (TOPAMAX) 50 MG tablet Take 1 tablet (50 mg total) by mouth every evening. 90 tablet 0  . traMADol (ULTRAM) 50 MG tablet take 1 tablet by mouth every 6 hours if needed  0  . zolpidem (AMBIEN) 10 MG tablet Take 1 tablet (10 mg total) by mouth at bedtime as needed. Fill 03/03/2017 90 tablet 0   No facility-administered medications prior to visit.     PAST MEDICAL HISTORY: Past Medical History:  Diagnosis Date  . Anemia   . Anxiety   . Depression   . GERD (gastroesophageal reflux disease)   . Hyperlipidemia   . IBS (irritable bowel syndrome)   . Psychogenic nonepileptic seizure    hx/notes 12/19/2016  . Restless leg syndrome   . Stroke ([place]) 12/18/2016   Acute arterial ischemic stroke, multifocal, posterior circulation Alicia [name] 12/19/2016    PAST SURGICAL HISTORY: Past Surgical History:  Procedure Laterality Date  . ABDOMINOPLASTY  Feb. 2015  . CESAREAN SECTION  2011  . COLONOSCOPY WITH PROPOFOL N/A 11/26/2016   Procedure: COLONOSCOPY WITH PROPOFOL;   Surgeon: Alicia Bellows, MD;  Location[facility] ENDOSCOPY;  Service: Gastroenterology;  Laterality: N/A;  . CYSTOSCOPY  02/02/2015   Procedure: CYSTOSCOPY;  Surgeon: Gae Dry, MD;  Location: [facility] ORS;  Service: Gynecology;;  . Brigitte Pulse AND CURETTAGE OF UTERUS  2003, 2005, 2008  . ESOPHAGOGASTRODUODENOSCOPY (EGD) WITH PROPOFOL N/A 11/26/2016   Procedure: ESOPHAGOGASTRODUODENOSCOPY (EGD) WITH PROPOFOL;  Surgeon: Alicia Bellows, MD;  Location: [facility] ENDOSCOPY;  Service: Gastroenterology;  Laterality: N/A;  . HERNIA REPAIR  [id number]   Umbilical  . KNEE ARTHROSCOPY Right 2006  . LAPAROSCOPIC BILATERAL SALPINGECTOMY Bilateral 02/02/2015   Procedure: LAPAROSCOPIC BILATERAL SALPINGECTOMY;  Surgeon: Gae Dry, MD;  Location: [facility] ORS;  Service: Gynecology;  Laterality: Bilateral;  . LAPAROSCOPIC HYSTERECTOMY N/A 02/02/2015   Procedure: HYSTERECTOMY TOTAL LAPAROSCOPIC;  Surgeon: Gae Dry, MD;  Location: [facility] ORS;  Service: Gynecology;  Laterality: N/A;  . LOOP RECORDER INSERTION N/A 12/20/2016   Procedure: LOOP RECORDER INSERTION;  Surgeon: Constance Haw, MD;  Location: [place] CV LAB;  Service: Cardiovascular;  Laterality: N/A;  . REDUCTION  MAMMAPLASTY Bilateral December 2015  . TEE WITHOUT CARDIOVERSION N/A 12/20/2016   Procedure: TRANSESOPHAGEAL ECHOCARDIOGRAM (TEE);  Surgeon: Arilynn Spark, MD;  Location: [facility] ENDOSCOPY;  Service: Cardiovascular;  Laterality: N/A;    FAMILY HISTORY: Family History  Problem Relation Age of Onset  . Hypertension Mother   . Hyperlipidemia Mother   . Heart Problems Father        hole in heart and lower ventricles reversed  . Prostate cancer Maternal Grandfather   . Von Willebrand disease Maternal Uncle     SOCIAL HISTORY: Social History   Socioeconomic History  . Marital status: Married    Spouse name: [name]   . Number of children: 4  . Years of education: Not on file  . Highest education level: Not on file  Social Needs  . Financial  resource strain: Not on file  . Food insecurity - worry: Not on file  . Food insecurity - inability: Not on file  . Transportation needs - medical: Not on file  . Transportation needs - non-medical: Not on file  Occupational History  . Occupation: Aeronautical engineer: [employer]  Tobacco Use  . Smoking status: Never Smoker  . Smokeless tobacco: Never Used  Substance and Sexual Activity  . Alcohol use: No    Alcohol/week: 0.0 oz  . Drug use: No  . Sexual activity: Yes    Partners: Male  Other Topics Concern  . Not on file  Social History Narrative   First husband died suddenly in 2014-04-23   Remarried 06/07/2016     PHYSICAL EXAM  There were no vitals filed for this visit. There is no height or weight on file to calculate BMI.  Generalized: Well developed, in no acute distress  Head: normocephalic and atraumatic,. Oropharynx benign  Neck: Supple, no carotid bruits  Cardiac: Regular rate rhythm, no murmur  Musculoskeletal: No deformity   Neurological examination   Mentation: Alert oriented to time, place, history taking. Attention span and concentration appropriate. Recent and remote memory intact.  Follows all commands speech and language fluent.   Cranial nerve II-XII: Fundoscopic exam reveals sharp disc margins.Pupils were equal round reactive to light extraocular movements were full, visual field were full on confrontational test. Facial sensation and strength were normal. hearing was intact to finger rubbing bilaterally. Uvula tongue midline. head turning and shoulder shrug were normal and symmetric.Tongue protrusion into cheek strength was normal. Motor: normal bulk and tone, full strength in the BUE, BLE, fine finger movements normal, no pronator drift. No focal weakness Sensory: normal and symmetric to light touch, pinprick, and  Vibration, proprioception  Coordination: finger-nose-finger, heel-to-shin bilaterally, no dysmetria Reflexes: Brachioradialis 2/2,  biceps 2/2, triceps 2/2, patellar 2/2, Achilles 2/2, plantar responses were flexor bilaterally. Gait and Station: Rising up from seated position without assistance, normal stance,  moderate stride, good arm swing, smooth turning, able to perform tiptoe, and heel walking without difficulty. Tandem gait is steady  DIAGNOSTIC DATA (LABS, IMAGING, TESTING) - I reviewed patient records, labs, notes, testing and imaging myself where available.  Lab Results  Component Value Date   WBC 6.2 12/20/2016   HGB 11.7 (L) 12/20/2016   HCT 34.8 (L) 12/20/2016   MCV 89.7 12/20/2016   PLT 205 12/20/2016      Component Value Date/Time   NA 139 12/20/2016 0440   NA 142 04/26/2016 0836   NA 141 07/25/2011 1955   K 3.7 12/20/2016 0440   K 4.0 07/25/2011 1955   CL  106 12/20/2016 0440   CL 107 07/25/2011 1955   CO2 25 12/20/2016 0440   CO2 25 07/25/2011 1955   GLUCOSE 103 (H) 12/20/2016 0440   BUN 10 12/20/2016 0440   BUN 17 04/26/2016 0836   CREATININE 1.01 (H) 12/20/2016 0440   CREATININE 0.97 10/10/2016 1011   CALCIUM 8.4 (L) 12/20/2016 0440   PROT 6.6 12/19/2016 0009   PROT 6.7 04/26/2016 0836   ALBUMIN 4.0 12/19/2016 0009   ALBUMIN 4.1 04/26/2016 0836   AST 19 12/19/2016 0009   ALT 16 12/19/2016 0009   ALKPHOS 67 12/19/2016 0009   BILITOT 0.9 12/19/2016 0009   BILITOT 0.6 04/26/2016 0836   GFRNONAA >60 12/20/2016 0440   GFRNONAA 70 10/10/2016 1011   GFRAA >60 12/20/2016 0440   GFRAA 81 10/10/2016 1011   Lab Results  Component Value Date   CHOL 230 (H) 12/19/2016   HDL 37 (L) 12/19/2016   LDLCALC 117 (H) 12/19/2016   TRIG 378 (H) 12/19/2016   CHOLHDL 6.2 12/19/2016   Lab Results  Component Value Date   HGBA1C 5.5 12/19/2016   Lab Results  Component Value Date   VITAMINB12 613 02/08/2015   Lab Results  Component Value Date   TSH 1.860 04/26/2016    ***  ASSESSMENT AND PLAN  47 y.o. year old female  has a past medical history of Anemia, Anxiety, Depression, GERD  (gastroesophageal reflux disease), Hyperlipidemia, IBS (irritable bowel syndrome), Psychogenic nonepileptic seizure, Restless leg syndrome, and Stroke ([place]) (12/18/2016). here with ***  38 year Caucasian lady with cryptogenic left cerebellar and occipital infarcts in November 2018. Vascular risk factors of mild hyperlipidemia only. New complaints of right calf and leg pain of unclear etiology.   PLAN: I had a long d/w patient and her husband about her recent cryptogenic stroke, risk for recurrent stroke/TIAs, personally independently reviewed imaging studies and stroke evaluation results and answered questions.Continue aspirin 81 mg daily  for secondary stroke prevention and maintain strict control of hypertension with blood pressure goal below 130/90, diabetes with hemoglobin A1c goal below 6.5% and lipids with LDL cholesterol goal below 70 mg/dL. I also advised the patient to eat a healthy diet with plenty of whole grains, cereals, fruits and vegetables, exercise regularly and maintain ideal body weight . Start Crestor 2.5 mg daily along with co-enzyme Q10 200 mg daily to minimize statin myalgias. Continue follow-up with her orthopedic doctor for right calf and leg pain. She may return to work part-time after January 3 for 2-4 weeks and then return for a time after that .she may participate in the young [place] stroke registry if interested Followup in the future with my nurse practitioner   Dennie Bible, [facility], [facility], [facility] [address] [place], [place] [address] [phone]

## 2017-04-25 ENCOUNTER — Ambulatory Visit: Payer: Self-pay | Admitting: Nurse Practitioner

## 2017-04-28 ENCOUNTER — Encounter: Payer: Self-pay | Admitting: Nurse Practitioner

## 2017-05-06 DIAGNOSIS — A084 Viral intestinal infection, unspecified: Secondary | ICD-10-CM | POA: Diagnosis not present

## 2017-05-19 ENCOUNTER — Encounter (HOSPITAL_COMMUNITY): Payer: Self-pay | Admitting: *Deleted

## 2017-05-19 ENCOUNTER — Emergency Department (HOSPITAL_COMMUNITY): Payer: BLUE CROSS/BLUE SHIELD

## 2017-05-19 ENCOUNTER — Emergency Department (HOSPITAL_COMMUNITY)
Admission: EM | Admit: 2017-05-19 | Discharge: 2017-05-20 | Disposition: A | Discharge status: Home / Self Care | Payer: BLUE CROSS/BLUE SHIELD | Attending: Physician Assistant | Admitting: Physician Assistant

## 2017-05-19 DIAGNOSIS — R51 Headache: Secondary | ICD-10-CM | POA: Diagnosis not present

## 2017-05-19 DIAGNOSIS — R52 Pain, unspecified: Secondary | ICD-10-CM | POA: Diagnosis not present

## 2017-05-19 DIAGNOSIS — G43009 Migraine without aura, not intractable, without status migrainosus: Secondary | ICD-10-CM | POA: Diagnosis not present

## 2017-05-19 DIAGNOSIS — Z79899 Other long term (current) drug therapy: Secondary | ICD-10-CM | POA: Diagnosis not present

## 2017-05-19 DIAGNOSIS — G4489 Other headache syndrome: Secondary | ICD-10-CM | POA: Diagnosis not present

## 2017-05-19 DIAGNOSIS — Z7982 Long term (current) use of aspirin: Secondary | ICD-10-CM | POA: Diagnosis not present

## 2017-05-19 LAB — CBC WITH DIFFERENTIAL/PLATELET
BASOS ABS: 0 10*3/uL (ref 0.0–0.1)
BASOS PCT: 0 %
EOS PCT: 0 %
Eosinophils Absolute: 0 10*3/uL (ref 0.0–0.7)
HCT: 36.3 % (ref 36.0–46.0)
HEMOGLOBIN: 12.2 g/dL (ref 12.0–15.0)
Lymphocytes Relative: 10 %
Lymphs Abs: 1.3 10*3/uL (ref 0.7–4.0)
MCH: 31.4 pg (ref 26.0–34.0)
MCHC: 33.6 g/dL (ref 30.0–36.0)
MCV: 93.6 fL (ref 78.0–100.0)
Monocytes Absolute: 0.3 10*3/uL (ref 0.1–1.0)
Monocytes Relative: 2 %
NEUTROS PCT: 88 %
Neutro Abs: 11.9 10*3/uL — ABNORMAL HIGH (ref 1.7–7.7)
Platelets: 256 10*3/uL (ref 150–400)
RBC: 3.88 MIL/uL (ref 3.87–5.11)
RDW: 13.7 % (ref 11.5–15.5)
WBC: 13.5 10*3/uL — AB (ref 4.0–10.5)

## 2017-05-19 LAB — COMPREHENSIVE METABOLIC PANEL
ALBUMIN: 3.6 g/dL (ref 3.5–5.0)
ALK PHOS: 61 U/L (ref 38–126)
ALT: 18 U/L (ref 14–54)
AST: 17 U/L (ref 15–41)
Anion gap: 8 (ref 5–15)
BUN: 15 mg/dL (ref 6–20)
CHLORIDE: 105 mmol/L (ref 101–111)
CO2: 25 mmol/L (ref 22–32)
CREATININE: 0.89 mg/dL (ref 0.44–1.00)
Calcium: 8.7 mg/dL — ABNORMAL LOW (ref 8.9–10.3)
GFR calc non Af Amer: >60 mL/min (ref >60)
GLUCOSE: 135 mg/dL — AB (ref 65–99)
Potassium: 3.7 mmol/L (ref 3.5–5.1)
SODIUM: 138 mmol/L (ref 135–145)
Total Bilirubin: 0.7 mg/dL (ref 0.3–1.2)
Total Protein: 6.3 g/dL — ABNORMAL LOW (ref 6.5–8.1)

## 2017-05-19 LAB — I-STAT BETA HCG BLOOD, ED (MC, WL, AP ONLY): I-stat hCG, quantitative: <5 m[IU]/mL (ref <5)

## 2017-05-19 MED ORDER — KETOROLAC TROMETHAMINE 15 MG/ML IJ SOLN
15.0000 mg | Freq: Once | INTRAMUSCULAR | Status: AC
  Administered 2017-05-19: 15 mg via INTRAVENOUS
  Filled 2017-05-19: qty 1

## 2017-05-19 MED ORDER — DIPHENHYDRAMINE HCL 50 MG/ML IJ SOLN
25.0000 mg | Freq: Once | INTRAMUSCULAR | Status: AC
  Administered 2017-05-19: 25 mg via INTRAVENOUS
  Filled 2017-05-19: qty 1

## 2017-05-19 MED ORDER — PROCHLORPERAZINE EDISYLATE 5 MG/ML IJ SOLN
10.0000 mg | Freq: Once | INTRAMUSCULAR | Status: AC
  Administered 2017-05-19: 10 mg via INTRAVENOUS
  Filled 2017-05-19: qty 2

## 2017-05-19 MED ORDER — ALPRAZOLAM 0.25 MG PO TABS
1.0000 mg | ORAL_TABLET | Freq: Once | ORAL | Status: AC
  Administered 2017-05-19: 1 mg via ORAL
  Filled 2017-05-19: qty 4

## 2017-05-19 MED ORDER — SODIUM CHLORIDE 0.9 % IV BOLUS
1000.0000 mL | Freq: Once | INTRAVENOUS | Status: AC
  Administered 2017-05-19: 1000 mL via INTRAVENOUS

## 2017-05-19 NOTE — ED Notes (Signed)
Call to EDP, order for CT.  Call to CT to expedite CT head

## 2017-05-19 NOTE — ED Provider Notes (Signed)
[facility] EMERGENCY DEPARTMENT Provider Note   CSN: [id number] Arrival date & time: 05/19/17  2050     History   Chief Complaint Chief Complaint  Patient presents with  . Headache    HPI Alberto Schoch Note Francesconi is a 47 y.o. female.  HPI   Patient is a 47 year old female presenting with history of psychogenic seizures, anxiety, depression, IBS, psychogenic nonepileptic seizures, restless leg syndrome.  Patient does have history of stroke in October.  Patient is presenting to the here today with acute headache.  Patient had multiple headaches in the past but says this 1 feels different.  It happened less than 6 hours ago.  When she returned home from work she had a headache.  Patient did not take her extended release Xanax this evening.  No neurologic symptoms.  She does reports headache bilateral frontal.  No fevers.  No nuchal rigidity.  On arrival on arrival patient crying, Moaning, clutching head.  Past Medical History:  Diagnosis Date  . Anemia   . Anxiety   . Depression   . GERD (gastroesophageal reflux disease)   . Hyperlipidemia   . IBS (irritable bowel syndrome)   . Psychogenic nonepileptic seizure    hx/notes 12/19/2016  . Restless leg syndrome   . Stroke (HCC) 12/18/2016   Acute arterial ischemic stroke, multifocal, posterior circulation [name] 12/19/2016    Patient Active Problem List   Diagnosis Date Noted  . Pain in right knee 01/21/2017  . Psychogenic nonepileptic seizure   . Chronic pain syndrome   . Restless leg syndrome   . Chronic prescription benzodiazepine use 01/20/2016  . Leukocytosis 01/20/2016  . Seizure-like activity (HCC)   . GAD (generalized anxiety disorder) 02/14/2015  . Neck pain 02/14/2015  . History of hysterectomy 02/14/2015  . S/P hysterectomy 02/02/2015  . Grieving 12/16/2014  . Iron deficiency anemia due to chronic blood loss 08/29/2014  . Insomnia, persistent 08/14/2014  . Major depression (HCC) 08/14/2014    . Temporomandibular joint sounds on opening and/or closing the jaw 08/14/2014  . Degenerative disc disease, lumbar 08/14/2014  . Bleeding internal hemorrhoids 08/14/2014  . Gastric reflux 08/14/2014  . Blood glucose elevated 08/14/2014  . Irritable bowel syndrome with constipation 08/14/2014  . Hypertriglyceridemia 08/14/2014  . Overweight 08/14/2014  . Restless legs syndrome 08/14/2014  . Tinnitus 08/14/2014  . Vitamin D deficiency 08/14/2014  . Tachycardia 11/25/2012  . DOE (dyspnea on exertion) 11/06/2012  . Previous cesarean delivery, delivered, with or without mention of antepartum condition 01/19/2012    Past Surgical History:  Procedure Laterality Date  . ABDOMINOPLASTY  Feb. 2015  . CESAREAN SECTION  2011  . COLONOSCOPY WITH PROPOFOL N/A 11/26/2016   Procedure: COLONOSCOPY WITH PROPOFOL;  Surgeon: Wyline Mood, MD;  Location: [facility] ENDOSCOPY;  Service: Gastroenterology;  Laterality: N/A;  . CYSTOSCOPY  02/02/2015   Procedure: CYSTOSCOPY;  Surgeon: Nadara Mustard, MD;  Location: [facility] ORS;  Service: Gynecology;;  . Joya Gaskins AND CURETTAGE OF UTERUS  2003, 2005, 2008  . ESOPHAGOGASTRODUODENOSCOPY (EGD) WITH PROPOFOL N/A 11/26/2016   Procedure: ESOPHAGOGASTRODUODENOSCOPY (EGD) WITH PROPOFOL;  Surgeon: Wyline Mood, MD;  Location: [facility] ENDOSCOPY;  Service: Gastroenterology;  Laterality: N/A;  . HERNIA REPAIR  1999   Umbilical  . KNEE ARTHROSCOPY Right 2006  . LAPAROSCOPIC BILATERAL SALPINGECTOMY Bilateral 02/02/2015   Procedure: LAPAROSCOPIC BILATERAL SALPINGECTOMY;  Surgeon: Nadara Mustard, MD;  Location: [facility] ORS;  Service: Gynecology;  Laterality: Bilateral;  . LAPAROSCOPIC HYSTERECTOMY N/A 02/02/2015   Procedure: HYSTERECTOMY  TOTAL LAPAROSCOPIC;  Surgeon: Nadara Mustard, MD;  Location: [facility] ORS;  Service: Gynecology;  Laterality: N/A;  . LOOP RECORDER INSERTION N/A 12/20/2016   Procedure: LOOP RECORDER INSERTION;  Surgeon: Regan Lemming, MD;  Location: [facility] INVASIVE CV  LAB;  Service: Cardiovascular;  Laterality: N/A;  . REDUCTION MAMMAPLASTY Bilateral December 2015  . TEE WITHOUT CARDIOVERSION N/A 12/20/2016   Procedure: TRANSESOPHAGEAL ECHOCARDIOGRAM (TEE);  Surgeon: Lars Masson, MD;  Location: [facility] ENDOSCOPY;  Service: Cardiovascular;  Laterality: N/A;     OB History    Gravida  11   Para  9   Term  6   Preterm  3   AB  2   Living  10     SAB  2   TAB  0   Ectopic  0   Multiple  1   Live Births  10            Home Medications    Prior to Admission medications   Medication Sig Start Date End Date Taking? Authorizing Provider  ALPRAZOLAM XR 1 MG 24 hr tablet Take 1 tablet (1 mg total) by mouth daily. 03/09/17   Alba Cory, MD  aspirin EC 81 MG tablet Take 1 tablet (81 mg total) by mouth daily. 01/23/17   Micki Riley, MD  carvedilol (COREG) 12.5 MG tablet TAKE 1 TABLET BY MOUTH TWICE DAILY WITH MEALS 04/03/17   Alba Cory, MD  co-enzyme Q-10 30 MG capsule Take 7 capsules (210 mg total) by mouth daily. 01/23/17   Micki Riley, MD  cyclobenzaprine (FLEXERIL) 10 MG tablet Take 1 tablet (10 mg total) by mouth daily as needed. 02/25/17   Alba Cory, MD  diclofenac sodium (VOLTAREN) 1 % GEL Apply 4 g topically 4 (four) times daily. Right medial knee 01/08/17   Alba Cory, MD  Icosapent Ethyl (VASCEPA) 1 g CAPS Take 2 g 2 (two) times daily by mouth. 12/25/16   Alba Cory, MD  pantoprazole (PROTONIX) 40 MG tablet Take 1 tablet (40 mg total) by mouth daily. 02/25/17   Alba Cory, MD  PARoxetine (PAXIL) 40 MG tablet Take 1 tablet (40 mg total) by mouth every morning. 02/25/17   Alba Cory, MD  rosuvastatin (CRESTOR) 5 MG tablet Take 0.5 tablets (2.5 mg total) by mouth daily. 01/23/17   Micki Riley, MD  topiramate (TOPAMAX) 50 MG tablet Take 1 tablet (50 mg total) by mouth every evening. 02/25/17   Alba Cory, MD  zolpidem (AMBIEN) 10 MG tablet Take 1 tablet (10 mg total) by mouth at bedtime as needed.  Fill 03/03/2017 02/25/17   Alba Cory, MD    Family History Family History  Problem Relation Age of Onset  . Hypertension Mother   . Hyperlipidemia Mother   . Heart Problems Father        hole in heart and lower ventricles reversed  . Prostate cancer Maternal Grandfather   . Von Willebrand disease Maternal Uncle     Social History Social History   Tobacco Use  . Smoking status: Never Smoker  . Smokeless tobacco: Never Used  Substance Use Topics  . Alcohol use: No    Alcohol/week: 0.0 oz  . Drug use: No     Allergies   Azithromycin and Penicillins   Review of Systems Review of Systems  Constitutional: Negative for activity change.  Respiratory: Negative for shortness of breath.   Cardiovascular: Negative for chest pain.  Gastrointestinal: Negative for abdominal pain.  Neurological: Positive  for headaches.  All other systems reviewed and are negative.    Physical Exam Updated Vital Signs BP 108/78 (BP Location: Right Arm)   Pulse (!) 112   Temp 97.9 F (36.6 C) (Oral)   Resp 16   LMP 01/15/2015 (Exact Date)   SpO2 100%   Physical Exam  Constitutional: She is oriented to person, place, and time. She appears well-developed and well-nourished.  Volitionally shaking.  HENT:  Head: Normocephalic and atraumatic.  Eyes: Pupils are equal, round, and reactive to light. EOM are normal. Right eye exhibits no discharge.  Cardiovascular: Normal rate, regular rhythm and normal heart sounds.  No murmur heard. Pulmonary/Chest: Effort normal and breath sounds normal. She has no wheezes. She has no rales.  Abdominal: Soft. She exhibits no distension. There is no tenderness.  Neurological: She is oriented to person, place, and time. She has normal strength. Coordination normal.  Cranial nerves II through XII grossly intact.  Pupils appear equal round reactive.  Patient has photophobia.  Tongue midline.  No facial asymmetry noted.  Skin: Skin is warm and dry. She is not  diaphoretic.  Psychiatric: She has a normal mood and affect.  Nursing note and vitals reviewed.    ED Treatments / Results  Labs (all labs ordered are listed, but only abnormal results are displayed) Labs Reviewed  CBC WITH DIFFERENTIAL/PLATELET  COMPREHENSIVE METABOLIC PANEL  I-STAT BETA HCG BLOOD, ED ([facility], [facility], [facility] ONLY)    EKG None  Radiology Ct Head Wo Contrast  Result Date: 05/19/2017 CLINICAL DATA:  Headache with nausea and vomiting. EXAM: CT HEAD WITHOUT CONTRAST TECHNIQUE: Contiguous axial images were obtained from the base of the skull through the vertex without intravenous contrast. COMPARISON:  CT head dated December 19, 2016. FINDINGS: Brain: No evidence of acute infarction, hemorrhage, hydrocephalus, extra-axial collection or mass lesion/mass effect. Vascular: No hyperdense vessel or unexpected calcification. Skull: Normal. Negative for fracture or focal lesion. Sinuses/Orbits: No acute finding. Other: None. IMPRESSION: 1. Normal noncontrast head CT. Electronically Signed   By: Obie Dredge M.D.   On: 05/19/2017 21:31    Procedures Procedures (including critical care time)  Medications Ordered in ED Medications  sodium chloride 0.9 % bolus 1,000 mL (has no administration in time range)  ketorolac (TORADOL) 15 MG/ML injection 15 mg (has no administration in time range)  prochlorperazine (COMPAZINE) injection 10 mg (has no administration in time range)  diphenhydrAMINE (BENADRYL) injection 25 mg (has no administration in time range)  ALPRAZolam (XANAX) tablet 1 mg (has no administration in time range)     Initial Impression / Assessment and Plan / ED Course  I have reviewed the triage vital signs and the nursing notes.  Pertinent labs & imaging results that were available during my care of the patient were reviewed by me and considered in my medical decision making (see chart for details).      Patient is a 47 year old female presenting with history of  psychogenic seizures, anxiety, depression, IBS, psychogenic nonepileptic seizures, restless leg syndrome.  Patient does have history of stroke in October.  Patient is presenting to the here today with acute headache.  Patient had multiple headaches in the past but says this 1 feels different.  It happened less than 6 hours ago.  When she returned home from work she had a headache.  Patient did not take her extended release Xanax this evening.  No neurologic symptoms.  She does reports headache bilateral frontal.  No fevers.  No nuchal rigidity.  On arrival on arrival patient crying, Moaning, clutching head.  9:37 PM Due to extremis on arrival, patient went straight to CT head.  It is negative.  I think this is reasonably ruled out subarachnoid hemorrhage given that it was done within 6 hours of onset of headache.  Patient has no neurologic symptoms.  No nuchal rigidity or fever.  Doubt encephalitis, meningitis.  I think patient likely has a bad headache or migraine.  Will treat with migraine cocktail.  Patient also has element of anxiety given the volitional shaking.  Will give her home Xanax.  Patient resting comfortably after migraine cocktail. Will plan to DC once she has woken up.   Final Clinical Impressions(s) / ED Diagnoses   Final diagnoses:  None    ED Discharge Orders    None       Abelino DerrickMackuen, Gustie Bobb Lyn, MD 05/20/17 [id number]

## 2017-05-19 NOTE — ED Notes (Signed)
Pt ambulatory to restroom without difficulty. Pt reports being tired. Husband at side.

## 2017-05-19 NOTE — ED Triage Notes (Signed)
[facility] and nausea/vomiting which began today 10min pta.  Per ems pt has been under some stress, possible stroke hx.  Per ems, no weakness or numbness.

## 2017-05-19 NOTE — Discharge Instructions (Signed)
We think you had a severe headache today.  We wwantyou to follow-up with your primary care physician and your neurologist.   Please return with any concerns.

## 2017-05-19 NOTE — ED Notes (Signed)
Pt is tearful, spouse at the bedside.  Pt is alert and oriented.  Pt reports HA which is severe, no neuro deficits noted, pt states that she was "upset and then HA began"

## 2017-05-26 ENCOUNTER — Ambulatory Visit (INDEPENDENT_AMBULATORY_CARE_PROVIDER_SITE_OTHER): Payer: BLUE CROSS/BLUE SHIELD | Admitting: *Deleted

## 2017-05-26 DIAGNOSIS — I639 Cerebral infarction, unspecified: Secondary | ICD-10-CM | POA: Diagnosis not present

## 2017-05-27 ENCOUNTER — Ambulatory Visit: Payer: BLUE CROSS/BLUE SHIELD | Admitting: Family Medicine

## 2017-05-27 ENCOUNTER — Encounter: Payer: Self-pay | Admitting: Family Medicine

## 2017-05-27 VITALS — BP 104/70 | HR 94 | Resp 16 | Ht 68.0 in | Wt 163.7 lb

## 2017-05-27 DIAGNOSIS — I69398 Other sequelae of cerebral infarction: Secondary | ICD-10-CM | POA: Diagnosis not present

## 2017-05-27 DIAGNOSIS — E781 Pure hyperglyceridemia: Secondary | ICD-10-CM

## 2017-05-27 DIAGNOSIS — Z9181 History of falling: Secondary | ICD-10-CM | POA: Diagnosis not present

## 2017-05-27 DIAGNOSIS — R269 Unspecified abnormalities of gait and mobility: Secondary | ICD-10-CM | POA: Diagnosis not present

## 2017-05-27 DIAGNOSIS — F5104 Psychophysiologic insomnia: Secondary | ICD-10-CM | POA: Diagnosis not present

## 2017-05-27 DIAGNOSIS — G4700 Insomnia, unspecified: Secondary | ICD-10-CM | POA: Diagnosis not present

## 2017-05-27 DIAGNOSIS — M25561 Pain in right knee: Secondary | ICD-10-CM

## 2017-05-27 DIAGNOSIS — G8929 Other chronic pain: Secondary | ICD-10-CM

## 2017-05-27 DIAGNOSIS — F411 Generalized anxiety disorder: Secondary | ICD-10-CM | POA: Diagnosis not present

## 2017-05-27 DIAGNOSIS — J069 Acute upper respiratory infection, unspecified: Secondary | ICD-10-CM

## 2017-05-27 DIAGNOSIS — G43009 Migraine without aura, not intractable, without status migrainosus: Secondary | ICD-10-CM | POA: Insufficient documentation

## 2017-05-27 DIAGNOSIS — M542 Cervicalgia: Secondary | ICD-10-CM

## 2017-05-27 DIAGNOSIS — R000 Tachycardia, unspecified: Secondary | ICD-10-CM | POA: Diagnosis not present

## 2017-05-27 MED ORDER — BENZONATATE 100 MG PO CAPS: 100.0000 mg | ORAL_CAPSULE | Freq: Two times a day (BID) | ORAL | 0 refills | Status: DC | PRN

## 2017-05-27 MED ORDER — ZOLPIDEM TARTRATE 10 MG PO TABS: 10.0000 mg | ORAL_TABLET | Freq: Every evening | ORAL | 0 refills | Status: DC | PRN

## 2017-05-27 MED ORDER — CARVEDILOL 3.125 MG PO TABS: 3.1250 mg | ORAL_TABLET | Freq: Two times a day (BID) | ORAL | 1 refills | Status: DC

## 2017-05-27 MED ORDER — ALPRAZOLAM XR 1 MG PO TB24: 1.0000 mg | ORAL_TABLET | Freq: Every day | ORAL | 2 refills | Status: DC
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
## 2017-05-27 NOTE — Progress Notes (Signed)
[facility] Summary Report / Loop Recorder 

## 2017-05-28 ENCOUNTER — Telehealth: Payer: Self-pay | Admitting: Neurology

## 2017-05-28 NOTE — Telephone Encounter (Signed)
I have called the pt to make her aware that Dr Pearlean BrownieSethi schedule is full and he wont be in next wk due to being in the hospital. The patient was informed that there was a opening on Eber Jonesarolyn NP schedule. Pt took apt for Friday at 8 am.

## 2017-05-28 NOTE — Telephone Encounter (Signed)
Message from Mychart, Generic sent at 05/28/2017 9:58 AM EDT -----    Appointment Request From: Elta Guadeloupeorothy Van Note [name]    With Provider: Delia HeadyPramod Sethi, MD [[facility]]    Preferred Date Range: 05/30/2017 - 06/05/2017    Preferred Times: Monday Morning, Monday Afternoon, Tuesday Morning, Tuesday Afternoon, Wednesday Morning, Wednesday Afternoon, Thursday Morning, Thursday Afternoon, Friday Morning, Friday Afternoon    Reason for visit: Request an Appointment    Comments:  Long term effects of stroke not improving, getting worse.

## 2017-05-29 NOTE — Progress Notes (Signed)
[facility]  PATIENT: Alicia [name] Note Ignasiak DOB: 1970/05/15   REASON FOR VISIT: follow up for history of stroke, new complaints of inability to focus at work [name] alone at visit   [name] 4/12/2019CM [name], 47 year old female returns for follow-up with history of stroke in November 2019.  Patient claims she got lost on the way here and was 20 minutes late for her appointment.  She reports since her stroke she has had more difficulty trying to focus especially at the end of the day.  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  In terms of her stroke she has not had recurrent stroke or TIA symptoms.  She remains on aspirin for secondary stroke prevention with minimal bruising and no bleeding.  She is also on Crestor for lipid management.  She continues to have a problem with her right lower leg and physical therapy has concluded.  She sees Ortho in [place].  Blood pressure in the office today 130/83.  She has a history of depression and is on Paxil by her primary care she also takes Ambien for insomnia.  Loop recorder so far no atrial fibrillation.  She was seen in the emergency room on 05/19/2017 for acute headache.  She was given a migraine type cocktail with relief.  She claims she has a history of migraines in the past she has been on Topamax in the past for these.  CT of the head was read as normal.  She has a history of anxiety depression and nonepileptic seizures.  She returns for reevaluation  01/23/17 PSMs. [name] is a 63 year Caucasian lady seen today for first office follow-up visit following hospital admission for stroke in November 2018. She is accompanied by her husband. History is obtained from them as well as review of electronic medical records. I personally reviewed imaging films. [name] Note [name] a 47 y.o.femalewho presents with dizziness that started sometime between  9 and 10 PM. On 10/31/2018She states that she was sitting watching TV, and its of the became hard to see things because "everything kept Moving."She then came into the emergency department and because of her symptoms an MRI was obtained which shows a small cerebellar infarct.Of note, she was able to stand up and walk to the bedside commode earlier and felt like she is able to do,she did have some dizziness. LKW:9pm on 12/18/16.tpa given?: no,mild symptoms. Transthoracic echo showed normal ejection fraction. Transesophageal echo showed no cardiac source of  embolism orPFO. Lower extremity venous Dopplers were negative for DVT. ANA was negative. ESR was 7 mm. antiphospholipid   antibodies were negative. LDL cholesterol was 117 mg percent and hemoglobin A1c was 5.5. She had 48 hours of continuous telemetry monitoring which I reviewed and did not show any significant cardiac arrhythmias. Patient was started on Lipitor 80 mg and aspirin 325 mg. She states she still has some problems with blurred vision and depth perception as well as coordination particularly when she turns her head suddenly. She actually developed significant pain in her right calf and leg about a week after discharge. She has seen her primary care physician who gave her some muscle relaxant did not help. She has seen orthopedics surgeon [place] x-rays of the knee which were unremarkable. His prescribed tramadol but she has not started it yet. She states her biggest problem right now is a difficulty walking as she she puts weight on her leg  the pain gets worse. At times she has difficult day in sleeping at night. The patient also had trouble tolerating aspirin 325 due to nausea and hence has decreased the dose. She had loop recorder inserted and so for paroxysmal atrial fibrillation has not yet been found. The patient could not tolerate Lipitor due to cramps and diarrhea and stopped it 2 weeks ago. She has no prior history of deep vein  thrombosis, pulmonary embolism. She had history of 2 miscarriages but antiphospholipid antibody test was negative. She does not smoke or do drugs. She is not on birth control pills and has no history of migraines. There is no history in the family of strokes or heart attacks at a young age.   REVIEW OF SYSTEMS: Full 14 system review of systems performed and notable only for those listed, all others are neg:  Constitutional: Fatigue Cardiovascular: neg Ear/Nose/Throat: neg  Skin: neg Eyes: Blurred vision Respiratory: neg Gastroitestinal: neg  Hematology/Lymphatic: neg  Endocrine: Flushing Musculoskeletal: Knee pain walking difficulty Allergy/Immunology: neg Neurological: Memory loss headache speech difficulty Psychiatric: Depression anxiety Sleep : Insomnia   ALLERGIES: Allergies  Allergen Reactions  . Azithromycin Diarrhea  . Penicillins Hives and Swelling    Has patient had a PCN reaction causing immediate rash, facial/tongue/throat swelling, SOB or lightheadedness with hypotension: Yes Has patient had a PCN reaction causing severe rash involving mucus membranes or skin necrosis: No Has patient had a PCN reaction that required hospitalization No Has patient had a PCN reaction occurring within the last 10 years: No If all of the above answers are "NO", then may proceed with Cephalosporin use.    HOME MEDICATIONS: Outpatient Medications Prior to Visit  Medication Sig Dispense Refill  . ALPRAZOLAM XR 1 MG 24 hr tablet Take 1 tablet (1 mg total) by mouth daily. 30 tablet 2  . aspirin EC 81 MG tablet Take 1 tablet (81 mg total) by mouth daily. 1 tablet 1  . carvedilol (COREG) 3.125 MG tablet Take 1 tablet (3.125 mg total) by mouth 2 (two) times daily with a meal. 180 tablet 1  . co-enzyme Q-10 30 MG capsule Take 7 capsules (210 mg total) by mouth daily. 60 capsule 3  . cyclobenzaprine (FLEXERIL) 10 MG tablet Take 1 tablet (10 mg total) by mouth daily as needed. (Patient taking  differently: Take 10 mg by mouth daily as needed for muscle spasms. ) 90 tablet 1  . Icosapent Ethyl (VASCEPA) 1 g CAPS Take 2 g 2 (two) times daily by mouth. 120 capsule 2  . pantoprazole (PROTONIX) 40 MG tablet Take 1 tablet (40 mg total) by mouth daily. 90 tablet 1  . PARoxetine (PAXIL) 40 MG tablet Take 1 tablet (40 mg total) by mouth every morning. 90 tablet 1  . rosuvastatin (CRESTOR) 5 MG tablet Take 0.5 tablets (2.5 mg total) by mouth daily. 30 tablet 3  . traMADol (ULTRAM) 50 MG tablet Take 50 mg by mouth every 6 (six) hours as needed (pain/headache).    . zolpidem (AMBIEN) 10 MG tablet Take 1 tablet (10 mg total) by mouth at bedtime as needed for sleep. Fill 03/03/2017 90 tablet 0  . benzonatate (TESSALON) 100 MG capsule Take 1-2 capsules (100-200 mg total) by mouth 2 (two) times daily as needed. (Patient not taking: Reported on 05/30/2017) 40 capsule 0   No facility-administered medications prior to visit.     PAST MEDICAL HISTORY: Past Medical History:  Diagnosis Date  . Anemia   . Anxiety   .  Depression   . GERD (gastroesophageal reflux disease)   . Hyperlipidemia   . IBS (irritable bowel syndrome)   . Migraine   . Psychogenic nonepileptic seizure    hx/notes 12/19/2016  . Restless leg syndrome   . Stroke ([place]) 12/18/2016   Acute arterial ischemic stroke, multifocal, posterior circulation [facility] 12/19/2016    PAST SURGICAL HISTORY: Past Surgical History:  Procedure Laterality Date  . ABDOMINOPLASTY  Feb. 2015  . CESAREAN SECTION  2011  . COLONOSCOPY WITH PROPOFOL N/A 11/26/2016   Procedure: COLONOSCOPY WITH PROPOFOL;  Surgeon: Jonathon Bellows, MD;  Location: [facility] END[facility]COPY;  Service: Gastroenterology;  Laterality: N/A;  . CYSTOSCOPY  02/02/2015   Procedure: CYSTOSCOPY;  Surgeon: Gae Dry, MD;  Location: [facility] ORS;  Service: Gynecology;;  . Brigitte Pulse AND CURETTAGE OF UTERUS  2003, 2005, 2008  . ESOPHAGOGASTRODUODENOSCOPY (EGD) WITH PROPOFOL N/A 11/26/2016   Procedure:  ESOPHAGOGASTRODUODENOSCOPY (EGD) WITH PROPOFOL;  Surgeon: Jonathon Bellows, MD;  Location: [facility] ENDOSCOPY;  Service: Gastroenterology;  Laterality: N/A;  . HERNIA REPAIR  [id number]   Umbilical  . KNEE ARTHROSCOPY Right 2006  . LAPAROSCOPIC BILATERAL SALPINGECTOMY Bilateral 02/02/2015   Procedure: LAPAROSCOPIC BILATERAL SALPINGECTOMY;  Surgeon: Gae Dry, MD;  Location: [facility] ORS;  Service: Gynecology;  Laterality: Bilateral;  . LAPAROSCOPIC HYSTERECTOMY N/A 02/02/2015   Procedure: HYSTERECTOMY TOTAL LAPAROSCOPIC;  Surgeon: Gae Dry, MD;  Location: [facility] ORS;  Service: Gynecology;  Laterality: N/A;  . LOOP RECORDER INSERTION N/A 12/20/2016   Procedure: LOOP RECORDER INSERTION;  Surgeon: Constance Haw, MD;  Location: [facility] [facility];  Service: Cardiovascular;  Laterality: N/A;  . REDUCTION MAMMAPLASTY Bilateral December 2015  . TEE WITHOUT CARDIOVERSION N/A 12/20/2016   Procedure: TRANSESOPHAGEAL ECHOCARDIOGRAM (TEE);  Surgeon: Yesenia Spark, MD;  Location: [facility] END[facility]COPY;  Service: Cardiovascular;  Laterality: N/A;    FAMILY HISTORY: Family History  Problem Relation Age of Onset  . Hypertension Mother   . Hyperlipidemia Mother   . Heart Problems Father        hole in heart and lower ventricles reversed  . Prostate cancer Maternal Grandfather   . Von Willebrand disease Maternal Uncle     SOCIAL HISTORY: Social History   Socioeconomic History  . Marital status: Married    Spouse name: [name]   . Number of children: 4  . Years of education: Not on file  . Highest education level: Not on file  Occupational History  . Occupation: Aeronautical engineer: [employer]  . Financial resource strain: Not on file  . Food insecurity:    Worry: Not on file    Inability: Not on file  . Transportation needs:    Medical: Not on file    Non-medical: Not on file  Tobacco Use  . Smoking status: Never Smoker  . Smokeless tobacco: Never Used  Substance and Sexual  Activity  . Alcohol use: No    Alcohol/week: 0.0 oz  . Drug use: No  . Sexual activity: Yes    Partners: Male  Lifestyle  . Physical activity:    Days per week: Not on file    Minutes per session: Not on file  . Stress: Not on file  Relationships  . Social connections:    Talks on phone: Not on file    Gets together: Not on file    Attends religious service: Not on file    Active member of club or organization: Not on file    Attends meetings  of clubs or organizations: Not on file    Relationship status: Not on file  . Intimate partner violence:    Fear of current or ex partner: Not on file    Emotionally abused: Not on file    Physically abused: Not on file    Forced sexual activity: Not on file  Other Topics Concern  . Not on file  Social History Narrative   First husband died suddenly in 15-May-2014   Remarried 06/07/2016     PHYSICAL EXAM  Vitals:   05/30/17 0827  BP: 130/83  Pulse: 95  Weight: 164 lb 3.2 oz (74.5 kg)  Height: '5\' 8"'  (1.727 m)   Body mass index is 24.97 kg/m.  Generalized: Well developed, in no acute distress intermittent crying during the exam Head: normocephalic and atraumatic,. Oropharynx benign  Neck: Supple, no carotid bruits  Cardiac: Regular rate rhythm, no murmur  Musculoskeletal: No deformity   Neurological examination   Mentation: Alert oriented to time, place, history taking. Attention span and concentration appropriate. Recent and remote memory intact.  Follows all commands speech and language fluent.   Cranial nerve II-XII: .Pupils were equal round reactive to light extraocular movements were full, visual field were full on confrontational test. Facial sensation and strength were normal. hearing was intact to finger rubbing bilaterally. Uvula tongue midline. head turning and shoulder shrug were normal and symmetric.Tongue protrusion into cheek strength was normal. Motor: normal bulk and tone, full strength in the BUE, BLE, except right  leg testing limited due to lower leg pain  Sensory: normal and symmetric to light touch, pinprick, and  Vibration, in the upper and lower extremities  Coordination: finger-nose-finger, heel-to-shin bilaterally, no dysmetria Reflexes: Brachioradialis 2/2, biceps 2/2, triceps 2/2, patellar 2/2, Achilles 2/2, plantar responses were flexor bilaterally. Gait and Station: Rising up from seated position without assistance, normal stance,  moderate stride, good arm swing, smooth turning, able to perform tiptoe, and heel walking without difficulty.  Unable to tandem due to right leg pain.  DIAGNOSTIC DATA (LABS, IMAGING, TESTING) - I reviewed patient records, labs, notes, testing and imaging myself where available.  Lab Results  Component Value Date   WBC 13.5 (H) 05/19/2017   HGB 12.2 05/19/2017   HCT 36.3 05/19/2017   MCV 93.6 05/19/2017   PLT 256 05/19/2017      Component Value Date/Time   NA 138 05/19/2017 05-15-2129   NA 142 04/26/2016 0836   NA 141 07/25/2011 1955   K 3.7 05/19/2017 May 15, 2129   K 4.0 07/25/2011 1955   CL 105 05/19/2017 2131   CL 107 07/25/2011 1955   CO2 25 05/19/2017 2131   CO2 25 07/25/2011 1955   GLUCOSE 135 (H) 05/19/2017 05-15-2129   BUN 15 05/19/2017 2131   BUN 17 04/26/2016 0836   CREATININE 0.89 05/19/2017 2131   CREATININE 0.97 10/10/2016 1011   CALCIUM 8.7 (L) 05/19/2017 May 15, 2129   PROT 6.3 (L) 05/19/2017 15-May-2129   PROT 6.7 04/26/2016 0836   ALBUMIN 3.6 05/19/2017 May 15, 2129   ALBUMIN 4.1 04/26/2016 0836   AST 17 05/19/2017 2131   ALT 18 05/19/2017 2131   ALKPHOS 61 05/19/2017 2129-05-15   BILITOT 0.7 05/19/2017 2129/05/15   BILITOT 0.6 04/26/2016 0836   GFRNONAA >60 05/19/2017 May 15, 2129   GFRNONAA 70 10/10/2016 1011   GFRAA >60 05/19/2017 05/15/29   GFRAA 81 10/10/2016 1011   Lab Results  Component Value Date   CHOL 230 (H) 12/19/2016   HDL 37 (L) 12/19/2016   LDLCALC 117 (  H) 12/19/2016   TRIG 378 (H) 12/19/2016   CHOLHDL 6.2 12/19/2016   Lab Results  Component Value Date   HGBA1C 5.5  12/19/2016   Lab Results  Component Value Date   IPJASNKN39 767 02/08/2015   Lab Results  Component Value Date   TSH 1.860 04/26/2016      ASSESSMENT AND PLAN 42 year Caucasian lady with cryptogenic left cerebellar and occipital infarcts in November 2018. Vascular risk factors of mild hyperlipidemia only.  Patient seen in the emergency room on 4/ 1/19  for migraine.  She was given a migraine cocktail with relief.  CT of the head was normal.  She also has a history of psychogenic nonepileptic seizures depression and anxiety.  She complains with inability to focus today, memory loss in fact she says she got lost coming here today was 20 minutes late for her appointment. She is tearful during her exam   PLAN:Stressed the importance of management of risk factors to prevent further stroke Continue asa for secondary stroke prevention Maintain strict control of hypertension with blood pressure goal below 130/90, today's reading130/83 and 120/70 continue antihypertensive medications Control of diabetes with hemoglobin A1c below 6.5 followed by primary care  Cholesterol with LDL cholesterol less than 70, followed by primary care,  continue statin drugs Crestor Exercise by walking, HEP  eat healthy diet with whole grains,  fresh fruits and vegetables Will start Depakote migraine prevention 515m ER at hs in stead of Topamax which may make memory worse.  Depakote as a mood stimulator as well as use for migraine prevention We will schedule neuropsych referral with [place]up with primary care for stroke risk factor modification, maintain blood pressure goal less than 1341systolic, diabetes with AP3Xbelow 7, lipids with LDL below 70 F/U here in 3 months Keep a record of headaches I spent 40 minutes in total face to face time with the patient more than 50% of which was spent counseling and coordination of care, reviewing test results reviewing medications and discussing and reviewing  the diagnosis of stroke, management of risk factors, migraine headaches and prevention depression and anxiety which may be contributing to her memory problems as well as polypharmacy.  Discussed neuropsych eval. NDennie Bible [facility] APRN  GN[facility] [address] SWest Falls ChurchGStrathmere [place] [phone]

## 2017-05-30 ENCOUNTER — Ambulatory Visit: Payer: BLUE CROSS/BLUE SHIELD | Admitting: Nurse Practitioner

## 2017-05-30 ENCOUNTER — Encounter: Payer: Self-pay | Admitting: Nurse Practitioner

## 2017-05-30 ENCOUNTER — Encounter: Payer: Self-pay | Admitting: Family Medicine

## 2017-05-30 ENCOUNTER — Telehealth: Payer: Self-pay | Admitting: *Deleted

## 2017-05-30 VITALS — BP 130/83 | HR 95 | Ht 68.0 in | Wt 164.2 lb

## 2017-05-30 DIAGNOSIS — F419 Anxiety disorder, unspecified: Secondary | ICD-10-CM | POA: Diagnosis not present

## 2017-05-30 DIAGNOSIS — F32A Depression, unspecified: Secondary | ICD-10-CM

## 2017-05-30 DIAGNOSIS — E785 Hyperlipidemia, unspecified: Secondary | ICD-10-CM

## 2017-05-30 DIAGNOSIS — F329 Major depressive disorder, single episode, unspecified: Secondary | ICD-10-CM | POA: Diagnosis not present

## 2017-05-30 DIAGNOSIS — G43009 Migraine without aura, not intractable, without status migrainosus: Secondary | ICD-10-CM

## 2017-05-30 DIAGNOSIS — R4182 Altered mental status, unspecified: Secondary | ICD-10-CM

## 2017-05-30 DIAGNOSIS — Z8673 Personal history of transient ischemic attack (TIA), and cerebral infarction without residual deficits: Secondary | ICD-10-CM

## 2017-05-30 MED ORDER — DIVALPROEX SODIUM ER 500 MG PO TB24: 500.0000 mg | ORAL_TABLET | Freq: Every day | ORAL | 6 refills | Status: DC

## 2017-05-30 NOTE — Patient Instructions (Signed)
Stressed the importance of management of risk factors to prevent further stroke Continue asa for secondary stroke prevention Maintain strict control of hypertension with blood pressure goal below 130/90, today's reading130/83 and 120/70 continue antihypertensive medications Control of diabetes with hemoglobin A1c below 6.5 followed by primary care  Cholesterol with LDL cholesterol less than 70, followed by primary care,  continue statin drugs Crestor Exercise by walking, HEP  eat healthy diet with whole grains,  fresh fruits and vegetables Will start Depakote migraine prevention 500mg  ER at hs Follow-up with primary care for stroke risk factor modification, maintain blood pressure goal less than 130 systolic, diabetes with A1c below 7, lipids with LDL below 70 F/U here in 3 months Keep a record of headaches

## 2017-05-30 NOTE — Telephone Encounter (Signed)
Patient was no show for follow up with NP today.  

## 2017-06-02 ENCOUNTER — Encounter: Payer: Self-pay | Admitting: Neurology

## 2017-06-03 LAB — CUP PACEART REMOTE DEVICE CHECK
Implantable Pulse Generator Implant Date: 20181103
MDC IDC SESS DTM: 20190305191805

## 2017-06-06 NOTE — Progress Notes (Signed)
I agree with the above plan 

## 2017-06-11 ENCOUNTER — Encounter: Payer: Self-pay | Admitting: Family Medicine

## 2017-06-26 ENCOUNTER — Telehealth: Payer: Self-pay | Admitting: Cardiology

## 2017-06-26 NOTE — Telephone Encounter (Signed)
Spoke w/ pt and requested that she send a manual transmission b/c her home monitor has not updated in at least 14 days.   

## 2017-06-27 ENCOUNTER — Ambulatory Visit (INDEPENDENT_AMBULATORY_CARE_PROVIDER_SITE_OTHER): Payer: BLUE CROSS/BLUE SHIELD | Admitting: *Deleted

## 2017-06-27 DIAGNOSIS — I639 Cerebral infarction, unspecified: Secondary | ICD-10-CM

## 2017-06-27 LAB — CUP PACEART REMOTE DEVICE CHECK
Date Time Interrogation Session: 20190407201001
Implantable Pulse Generator Implant Date: 20181103

## 2017-06-30 NOTE — Progress Notes (Signed)
[facility] Summary Report / Loop Recorder 

## 2017-07-10 ENCOUNTER — Encounter: Payer: Self-pay | Admitting: Family Medicine

## 2017-07-22 LAB — CUP PACEART REMOTE DEVICE CHECK
Implantable Pulse Generator Implant Date: 20181103
MDC IDC SESS DTM: 20190510203730

## 2017-07-30 ENCOUNTER — Ambulatory Visit (INDEPENDENT_AMBULATORY_CARE_PROVIDER_SITE_OTHER): Payer: BLUE CROSS/BLUE SHIELD | Admitting: *Deleted

## 2017-07-30 DIAGNOSIS — I639 Cerebral infarction, unspecified: Secondary | ICD-10-CM | POA: Diagnosis not present

## 2017-07-31 NOTE — Progress Notes (Signed)
[facility] Summary Report / Loop Recorder 

## 2017-08-08 DIAGNOSIS — I63442 Cerebral infarction due to embolism of left cerebellar artery: Secondary | ICD-10-CM | POA: Diagnosis not present

## 2017-08-08 DIAGNOSIS — Z8669 Personal history of other diseases of the nervous system and sense organs: Secondary | ICD-10-CM | POA: Diagnosis not present

## 2017-08-08 DIAGNOSIS — G43119 Migraine with aura, intractable, without status migrainosus: Secondary | ICD-10-CM | POA: Diagnosis not present

## 2017-08-11 ENCOUNTER — Other Ambulatory Visit: Payer: Self-pay | Admitting: Neurology

## 2017-08-11 DIAGNOSIS — Z8673 Personal history of transient ischemic attack (TIA), and cerebral infarction without residual deficits: Secondary | ICD-10-CM

## 2017-08-12 ENCOUNTER — Ambulatory Visit: Payer: BLUE CROSS/BLUE SHIELD | Admitting: Family Medicine

## 2017-08-12 ENCOUNTER — Encounter: Payer: Self-pay | Admitting: Family Medicine

## 2017-08-12 VITALS — BP 90/70 | HR 106 | Resp 16 | Ht 66.0 in | Wt 170.4 lb

## 2017-08-12 DIAGNOSIS — I69398 Other sequelae of cerebral infarction: Secondary | ICD-10-CM | POA: Diagnosis not present

## 2017-08-12 DIAGNOSIS — R4189 Other symptoms and signs involving cognitive functions and awareness: Secondary | ICD-10-CM

## 2017-08-12 DIAGNOSIS — R569 Unspecified convulsions: Secondary | ICD-10-CM

## 2017-08-12 DIAGNOSIS — E785 Hyperlipidemia, unspecified: Secondary | ICD-10-CM

## 2017-08-12 DIAGNOSIS — E781 Pure hyperglyceridemia: Secondary | ICD-10-CM

## 2017-08-12 DIAGNOSIS — E559 Vitamin D deficiency, unspecified: Secondary | ICD-10-CM

## 2017-08-12 DIAGNOSIS — D72829 Elevated white blood cell count, unspecified: Secondary | ICD-10-CM

## 2017-08-12 DIAGNOSIS — R7303 Prediabetes: Secondary | ICD-10-CM

## 2017-08-12 DIAGNOSIS — G43009 Migraine without aura, not intractable, without status migrainosus: Secondary | ICD-10-CM | POA: Diagnosis not present

## 2017-08-12 DIAGNOSIS — K219 Gastro-esophageal reflux disease without esophagitis: Secondary | ICD-10-CM | POA: Diagnosis not present

## 2017-08-12 DIAGNOSIS — F411 Generalized anxiety disorder: Secondary | ICD-10-CM

## 2017-08-12 DIAGNOSIS — F5104 Psychophysiologic insomnia: Secondary | ICD-10-CM | POA: Diagnosis not present

## 2017-08-12 DIAGNOSIS — R269 Unspecified abnormalities of gait and mobility: Secondary | ICD-10-CM

## 2017-08-12 MED ORDER — PANTOPRAZOLE SODIUM 40 MG PO TBEC: 40.0000 mg | DELAYED_RELEASE_TABLET | Freq: Every day | ORAL | 1 refills | Status: DC

## 2017-08-12 MED ORDER — PAROXETINE HCL 40 MG PO TABS: 40.0000 mg | ORAL_TABLET | ORAL | 1 refills | Status: DC

## 2017-08-12 MED ORDER — ALPRAZOLAM XR 1 MG PO TB24: 1.0000 mg | ORAL_TABLET | Freq: Every day | ORAL | 2 refills | Status: DC
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
## 2017-08-25 ENCOUNTER — Ambulatory Visit
Admission: RE | Admit: 2017-08-25 | Discharge: 2017-08-25 | Disposition: A | Discharge status: Home / Self Care | Payer: BLUE CROSS/BLUE SHIELD | Source: Ambulatory Visit | Attending: Neurology | Admitting: Neurology

## 2017-08-25 DIAGNOSIS — I639 Cerebral infarction, unspecified: Secondary | ICD-10-CM | POA: Diagnosis not present

## 2017-08-25 DIAGNOSIS — Z8673 Personal history of transient ischemic attack (TIA), and cerebral infarction without residual deficits: Secondary | ICD-10-CM

## 2017-08-25 DIAGNOSIS — Z09 Encounter for follow-up examination after completed treatment for conditions other than malignant neoplasm: Secondary | ICD-10-CM | POA: Diagnosis not present

## 2017-08-25 DIAGNOSIS — G9389 Other specified disorders of brain: Secondary | ICD-10-CM | POA: Diagnosis not present

## 2017-08-29 ENCOUNTER — Ambulatory Visit: Payer: BLUE CROSS/BLUE SHIELD | Admitting: Neurology

## 2017-08-29 DIAGNOSIS — G4733 Obstructive sleep apnea (adult) (pediatric): Secondary | ICD-10-CM | POA: Diagnosis not present

## 2017-09-01 ENCOUNTER — Ambulatory Visit (INDEPENDENT_AMBULATORY_CARE_PROVIDER_SITE_OTHER): Payer: BLUE CROSS/BLUE SHIELD | Admitting: *Deleted

## 2017-09-01 ENCOUNTER — Encounter: Payer: Self-pay | Admitting: Family Medicine

## 2017-09-01 ENCOUNTER — Telehealth: Payer: Self-pay

## 2017-09-01 DIAGNOSIS — I639 Cerebral infarction, unspecified: Secondary | ICD-10-CM

## 2017-09-01 NOTE — Telephone Encounter (Signed)
Sleep Study from [name] showed the patient is positive for Sleep Apnea and they recommend patient to return for a titration study. Dr. Carlynn PurlSowles did sign patient's order for the Titration study and [facility] will be contacting her to arrange this additional study.

## 2017-09-02 NOTE — Progress Notes (Signed)
[facility] Summary Report / Loop Recorder 

## 2017-09-03 ENCOUNTER — Ambulatory Visit
Admission: RE | Admit: 2017-09-03 | Discharge: 2017-09-03 | Disposition: A | Discharge status: Home / Self Care | Payer: BLUE CROSS/BLUE SHIELD | Source: Ambulatory Visit | Attending: Family Medicine | Admitting: Family Medicine

## 2017-09-03 ENCOUNTER — Telehealth: Payer: Self-pay

## 2017-09-03 ENCOUNTER — Encounter: Payer: Self-pay | Admitting: Family Medicine

## 2017-09-03 ENCOUNTER — Other Ambulatory Visit: Payer: Self-pay | Admitting: Family Medicine

## 2017-09-03 ENCOUNTER — Ambulatory Visit: Payer: BLUE CROSS/BLUE SHIELD | Admitting: Family Medicine

## 2017-09-03 VITALS — BP 92/68 | HR 118 | Temp 98.4°F | Resp 18 | Ht 66.0 in | Wt 172.5 lb

## 2017-09-03 DIAGNOSIS — R1011 Right upper quadrant pain: Secondary | ICD-10-CM | POA: Diagnosis not present

## 2017-09-03 DIAGNOSIS — S6992XA Unspecified injury of left wrist, hand and finger(s), initial encounter: Secondary | ICD-10-CM | POA: Diagnosis not present

## 2017-09-03 DIAGNOSIS — Z8673 Personal history of transient ischemic attack (TIA), and cerebral infarction without residual deficits: Secondary | ICD-10-CM

## 2017-09-03 DIAGNOSIS — X58XXXA Exposure to other specified factors, initial encounter: Secondary | ICD-10-CM | POA: Diagnosis not present

## 2017-09-03 DIAGNOSIS — I639 Cerebral infarction, unspecified: Secondary | ICD-10-CM

## 2017-09-03 DIAGNOSIS — R1013 Epigastric pain: Secondary | ICD-10-CM | POA: Diagnosis not present

## 2017-09-03 DIAGNOSIS — K30 Functional dyspepsia: Secondary | ICD-10-CM

## 2017-09-03 DIAGNOSIS — E785 Hyperlipidemia, unspecified: Secondary | ICD-10-CM

## 2017-09-03 DIAGNOSIS — R202 Paresthesia of skin: Secondary | ICD-10-CM

## 2017-09-03 DIAGNOSIS — M79645 Pain in left finger(s): Secondary | ICD-10-CM

## 2017-09-03 DIAGNOSIS — G4733 Obstructive sleep apnea (adult) (pediatric): Secondary | ICD-10-CM | POA: Diagnosis not present

## 2017-09-03 DIAGNOSIS — R4189 Other symptoms and signs involving cognitive functions and awareness: Secondary | ICD-10-CM | POA: Diagnosis not present

## 2017-09-03 DIAGNOSIS — M7989 Other specified soft tissue disorders: Secondary | ICD-10-CM | POA: Insufficient documentation

## 2017-09-03 MED ORDER — CLOPIDOGREL BISULFATE 75 MG PO TABS: 75.0000 mg | ORAL_TABLET | Freq: Every day | ORAL | 0 refills | Status: DC

## 2017-09-03 NOTE — Telephone Encounter (Signed)
Called pt to inform her that due to the extensive and complex lab tests ordered by Dr. Carlynn PurlSowles, we would not be able to draw [facility]; specifically hypercoagulable panel, comprehensive that was ordered; spoke with [place] [facility] and she stated they do not have a test by this name; they do have a consultative panel; called Irving BurtonEmily, NP with Dr. Carlynn PurlSowles and she states that is not the test they would like performed and they will print/order new labs and pt can come by their office and pick up updated forms; called pt to inform her that she will need to pick up new orders and have [facility] at [facility] draw station; pt verb u/o and appreciative of the call

## 2017-09-03 NOTE — Progress Notes (Signed)
Name: Alicia [name] [name]   MRN: [id number]    DOB: November 06, 1970   Date:09/03/2017       Progress [name]  Subjective  Chief Complaint  Chief Complaint  Patient presents with  . Abdominal Pain    RUQ since before last visit. Nausea , lots loose stools, bloating , fullness & achiness, especially after eating. no otc meds taken  . Hand Pain    left index finger swelling    HPI  Recurrent Strokes: seen by Dr. Sherryll BurgerShah, having recurrence of slurred speech, also paresthesia down both arms, sometimes weakness on right side and other times on left side. Still mental fogginess. [facility] because of symptoms. She is currently on Crestor and aspirin, recently diagnosed with OSA and will go back for titration study next week. Discussed adding plavix, [facility] worsening or recurrence of symptoms. Go back to Dr. Sherryll BurgerShah and we will add a hypercoagulable panel to her labs that will done tomorrow  Finger pain: she jammed her finger on a book when her arm got week two days ago. The left index finger is painful and swollen now. We will check x-ray  Dyspepsia: she has seen GI, on PPI, however having recurrence of indigestion, and RUQ pain, that is severe after meals, unable to eat and also very nauseated. He has noticed lose stools with increase in frequency, no fever or chills. Previous gallbladder US negative times 2, one in 2010 and second time done in 2014. We will check a HIDA scan   Patient Active Problem List   Diagnosis Date Noted  . History of stroke 05/30/2017  . Hyperlipidemia 05/30/2017  . Migraine without aura and without status migrainosus, not intractable 05/27/2017  . Pain in right knee 01/21/2017  . Psychogenic nonepileptic seizure   . Chronic pain syndrome   . Restless leg syndrome   . Chronic prescription benzodiazepine use 01/20/2016  . Leukocytosis 01/20/2016  . Seizure-like activity (HCC)   . GAD (generalized anxiety disorder) 02/14/2015  . Neck  pain 02/14/2015  . History of hysterectomy 02/14/2015  . S/P hysterectomy 02/02/2015  . Grieving 12/16/2014  . Iron deficiency anemia due to chronic blood loss 08/29/2014  . Insomnia, persistent 08/14/2014  . Major depression (HCC) 08/14/2014  . Temporomandibular joint sounds on opening and/or closing the jaw 08/14/2014  . Degenerative disc disease, lumbar 08/14/2014  . Bleeding internal hemorrhoids 08/14/2014  . Gastric reflux 08/14/2014  . Blood glucose elevated 08/14/2014  . Irritable bowel syndrome with constipation 08/14/2014  . Hypertriglyceridemia 08/14/2014  . Overweight 08/14/2014  . Restless legs syndrome 08/14/2014  . Tinnitus 08/14/2014  . Vitamin D deficiency 08/14/2014  . Tachycardia 11/25/2012  . DOE (dyspnea on exertion) 11/06/2012  . Previous cesarean delivery, delivered, with or without mention of antepartum condition 01/19/2012    Past Surgical History:  Procedure Laterality Date  . ABDOMINOPLASTY  Feb. 2015  . CESAREAN SECTION  2011  . COLONOSCOPY WITH PROPOFOL N/A 11/26/2016   Procedure: COLONOSCOPY WITH PROPOFOL;  Surgeon: Wyline MoodAnna, Kiran, MD;  [facility];  Service: Gastroenterology;  Laterality: N/A;  . CYSTOSCOPY  02/02/2015   Procedure: CYSTOSCOPY;  Surgeon: Nadara Mustardobert P Harris, MD;  Location: [facility] ORS;  Service: Gynecology;;  . Joya GaskinsILATION AND CURETTAGE OF UTERUS  2003, 2005, 2008  . ESOPHAGOGASTRODUODENOSCOPY (EGD) WITH PROPOFOL N/A 11/26/2016   Procedure: ESOPHAGOGASTRODUODENOSCOPY (EGD) WITH PROPOFOL;  Surgeon: Wyline MoodAnna, Kiran, MD;  [facility];  Service: Gastroenterology;  Laterality: N/A;  .  HERNIA REPAIR  1999   Umbilical  . KNEE ARTHROSCOPY Right 2004-07-16  . LAPAROSCOPIC BILATERAL SALPINGECTOMY Bilateral 02/02/2015   Procedure: LAPAROSCOPIC BILATERAL SALPINGECTOMY;  Surgeon: Nadara Mustard, MD;  Location: [facility] ORS;  Service: Gynecology;  Laterality: Bilateral;  . LAPAROSCOPIC HYSTERECTOMY N/A 02/02/2015   Procedure: HYSTERECTOMY TOTAL  LAPAROSCOPIC;  Surgeon: Nadara Mustard, MD;  Location: [facility] ORS;  Service: Gynecology;  Laterality: N/A;  . LOOP RECORDER INSERTION N/A 12/20/2016   Procedure: LOOP RECORDER INSERTION;  Surgeon: Regan Lemming, MD;  Location: [facility] INVASIVE CV LAB;  Service: Cardiovascular;  Laterality: N/A;  . REDUCTION MAMMAPLASTY Bilateral December 2015  . TEE WITHOUT CARDIOVERSION N/A 12/20/2016   Procedure: TRANSESOPHAGEAL ECHOCARDIOGRAM (TEE);  Surgeon: Lars Masson, MD;  Location: [facility] ENDOSCOPY;  Service: Cardiovascular;  Laterality: N/A;    Family History  Problem Relation Age of Onset  . Hypertension Mother   . Hyperlipidemia Mother   . Heart Problems Father        hole in heart and lower ventricles reversed  . Prostate cancer Maternal Grandfather   . Von Willebrand disease Maternal Uncle     Social History   Socioeconomic History  . Marital status: Married    Spouse name: Jonny Ruiz   . Number of children: 4  . Years of education: Not on file  . Highest education level: Not on file  Occupational History  . Occupation: Agricultural engineer: [employer]  Social Needs  . Financial resource strain: Somewhat hard  . Food insecurity:    Worry: Never true    Inability: Never true  . Transportation needs:    Medical: No    Non-medical: No  Tobacco Use  . Smoking status: Never Smoker  . Smokeless tobacco: Never Used  Substance and Sexual Activity  . Alcohol use: No    Alcohol/week: 0.0 oz  . Drug use: No  . Sexual activity: Yes    Partners: Male  Lifestyle  . Physical activity:    Days per week: 5 days    Minutes per session: 30 min  . Stress: Not at all  Relationships  . Social connections:    Talks on phone: More than three times a week    Gets together: More than three times a week    Attends religious service: Never    Active member of club or organization: No    Attends meetings of clubs or organizations: Never    Relationship status: Married  . Intimate partner  violence:    Fear of current or ex partner: No    Emotionally abused: No    Physically abused: No    Forced sexual activity: No  Other Topics Concern  . Not on file  Social History Narrative   First husband died suddenly in 17-Jul-2014   Remarried 06/07/2016     Current Outpatient Medications:  .  ALPRAZOLAM XR 1 MG 24 hr tablet, Take 1 tablet (1 mg total) by mouth daily., Disp: 30 tablet, Rfl: 2 .  aspirin EC 81 MG tablet, Take 1 tablet (81 mg total) by mouth daily., Disp: 1 tablet, Rfl: 1 .  carvedilol (COREG) 3.125 MG tablet, Take 1 tablet (3.125 mg total) by mouth 2 (two) times daily with a meal., Disp: 180 tablet, Rfl: 1 .  Coenzyme Q10 (COQ-10) 200 MG CAPS, Take 1 capsule by mouth daily., Disp: , Rfl:  .  cyclobenzaprine (FLEXERIL) 10 MG tablet, Take 1 tablet (10 mg total) by mouth daily as needed. (  Patient taking differently: Take 10 mg by mouth daily as needed for muscle spasms. ), Disp: 90 tablet, Rfl: 1 .  Icosapent Ethyl (VASCEPA) 1 g CAPS, Take 2 g 2 (two) times daily by mouth., Disp: 120 capsule, Rfl: 2 .  pantoprazole (PROTONIX) 40 MG tablet, Take 1 tablet (40 mg total) by mouth daily., Disp: 90 tablet, Rfl: 1 .  PARoxetine (PAXIL) 40 MG tablet, Take 1 tablet (40 mg total) by mouth every morning., Disp: 90 tablet, Rfl: 1 .  rosuvastatin (CRESTOR) 5 MG tablet, Take 0.5 tablets (2.5 mg total) by mouth daily., Disp: 30 tablet, Rfl: 3 .  zolpidem (AMBIEN) 10 MG tablet, Take 1 tablet (10 mg total) by mouth at bedtime as needed for sleep. Fill 03/03/2017, Disp: 90 tablet, Rfl: 0 .  clopidogrel (PLAVIX) 75 MG tablet, Take 1 tablet (75 mg total) by mouth daily., Disp: 30 tablet, Rfl: 0  Allergies  Allergen Reactions  . Azithromycin Diarrhea  . Penicillins Hives and Swelling    Has patient had a PCN reaction causing immediate rash, facial/tongue/throat swelling, SOB or lightheadedness with hypotension: Yes Has patient had a PCN reaction causing severe rash involving mucus membranes or  skin necrosis: No Has patient had a PCN reaction that required hospitalization No Has patient had a PCN reaction occurring within the last 10 years: No If all of the above answers are "NO", then may proceed with Cephalosporin use.     ROS  Constitutional: Negative for fever or significant  weight change.  Respiratory: Negative for cough and shortness of breath.   Cardiovascular: Negative for chest pain or palpitations.  Gastrointestinal: Positive  for abdominal pain and bowel changes.  Musculoskeletal: Positive  for gait problem - balance difficulty most of the time, and left index finger  joint swelling.  Skin: Negative for rash.  Neurological: Positive or dizziness and intermittent  headache.  No other specific complaints in a complete review of systems (except as listed in HPI above).  Objective  Vitals:   09/03/17 0816  BP: 92/68  Pulse: (!) 118  Resp: 18  Temp: 98.4 F (36.9 C)  TempSrc: Oral  SpO2: 99%  Weight: 172 lb 8 oz (78.2 kg)  Height: 5\' 6"  (1.676 m)    Body mass index is 27.84 kg/m.  Physical Exam  Constitutional: Patient appears well-developed and well-nourished. Overweight. No distress.  HEENT: head atraumatic, normocephalic, pupils equal and reactive to light, neck supple, throat within normal limits Cardiovascular: Normal rate, regular rhythm and normal heart sounds.  No murmur heard. No BLE edema. Pulmonary/Chest: Effort normal and breath sounds normal. No respiratory distress. Abdominal: Soft.  There is tenderness during palpation of epigastric and RUQ with voluntary guarding, no rebound tenderness, normal bowel sounds Muscular Skeletal: tender and swollen on PIP joint left index finger Psychiatric: Patient has a normal mood and affect. behavior is normal. Judgment and thought content normal. Neurological exam: slurred speech is a little worse today, normal gait, romberg negative, slow and unsteady gait   Recent Results (from the past 2160 hour(s))   CUP PACEART REMOTE DEVICE CHECK     Status: None   Collection Time: 06/27/17  8:37 PM  Result Value Ref Range   Date Time Interrogation Session [id number]    Pulse Generator Manufacturer [facility]    Pulse Gen Model [id number] Reveal LINQ    Pulse Gen Serial Number [id number]    Clinic Name [facility]    Implantable Pulse Generator Type ICM/ILR    Implantable Pulse Generator  Implant Date [id number]    Eval Rhythm ST      PHQ2/9: Depression screen [facility] 2/9 09/03/2017 08/12/2017 05/27/2017 10/10/2016 07/02/2016  Decreased Interest 0 0 0 0 0  Down, Depressed, Hopeless 1 1 0 0 0  PHQ - 2 Score 1 1 0 0 0  Altered sleeping 0 0 - - -  Tired, decreased energy 0 0 - - -  Change in appetite 0 0 - - -  Feeling bad or failure about yourself  1 1 - - -  Trouble concentrating 0 0 - - -  Moving slowly or fidgety/restless 0 0 - - -  Suicidal thoughts 0 0 - - -  PHQ-9 Score 2 2 - - -  Difficult doing work/chores - Not difficult at all - - -     Fall Risk: Fall Risk  09/03/2017 08/12/2017 08/12/2017 05/30/2017 05/27/2017  Falls in the past year? Yes Yes No Yes -  Number falls in past yr: 2 or more 1 - 1 -  Injury with Fall? Yes No - No -  Comment - - - - -  Risk for fall due to : History of fall(s) - - Impaired balance/gait History of fall(s);Impaired balance/gait  Follow up - Falls prevention discussed - - -      Functional Status Survey: Is the patient deaf or have difficulty hearing?: No Does the patient have difficulty seeing, even when wearing glasses/contacts?: Yes Does the patient have difficulty concentrating, remembering, or making decisions?: Yes Does the patient have difficulty walking or climbing stairs?: Yes Does the patient have difficulty dressing or bathing?: Yes Does the patient have difficulty doing errands alone such as visiting a doctor's office or shopping?: Yes    Assessment & Plan  1. Paresthesia of hand, bilateral  She will get labs, unlikely related to stroke but  advised to follow up with Dr. Sherryll Burger  2. History of stroke   2018   3. Cognitive impairment  From strokes   4. Dyslipidemia  Explained importance of continue cholesterol medication  5. Right upper quadrant pain  We will evaluated for gallbladder disease   6. Dyspepsia  - H. pylori breath test  7. OSA (obstructive sleep apnea)  Already scheduled for titration study   8. Recurrent strokes (HCC)  Major stroke 2018 , two new lesions since on recent MRI  - Hypercoagulable panel, comprehensive - clopidogrel (PLAVIX) 75 MG tablet; Take 1 tablet (75 mg total) by mouth daily.  Dispense: 30 tablet; Refill: 0  9. Functional dyspepsia  - NM Hepato W/Eject Fract  10. Pain in left finger(s)  - DG Finger Index Left; Future

## 2017-09-03 NOTE — Progress Notes (Unsigned)
Received call from Lab stating they are unable to run hypercoag panel.  New labs placed for pt to pick up at front desk. Please place paperwork at front desk.

## 2017-09-04 ENCOUNTER — Other Ambulatory Visit: Payer: BLUE CROSS/BLUE SHIELD

## 2017-09-05 ENCOUNTER — Ambulatory Visit: Payer: BLUE CROSS/BLUE SHIELD | Admitting: Nurse Practitioner

## 2017-09-05 ENCOUNTER — Other Ambulatory Visit: Payer: BLUE CROSS/BLUE SHIELD

## 2017-09-05 DIAGNOSIS — G4733 Obstructive sleep apnea (adult) (pediatric): Secondary | ICD-10-CM | POA: Diagnosis not present

## 2017-09-05 LAB — CUP PACEART REMOTE DEVICE CHECK
Implantable Pulse Generator Implant Date: 20181103
MDC IDC SESS DTM: 20190612203601

## 2017-09-06 ENCOUNTER — Other Ambulatory Visit: Payer: Self-pay | Admitting: Family Medicine

## 2017-09-06 DIAGNOSIS — F5104 Psychophysiologic insomnia: Secondary | ICD-10-CM

## 2017-09-08 ENCOUNTER — Other Ambulatory Visit: Payer: Self-pay

## 2017-09-08 ENCOUNTER — Other Ambulatory Visit: Payer: Self-pay | Admitting: Family Medicine

## 2017-09-08 DIAGNOSIS — F5104 Psychophysiologic insomnia: Secondary | ICD-10-CM

## 2017-09-08 DIAGNOSIS — G4733 Obstructive sleep apnea (adult) (pediatric): Secondary | ICD-10-CM | POA: Diagnosis not present

## 2017-09-08 DIAGNOSIS — Z8669 Personal history of other diseases of the nervous system and sense organs: Secondary | ICD-10-CM | POA: Diagnosis not present

## 2017-09-08 DIAGNOSIS — G43009 Migraine without aura, not intractable, without status migrainosus: Secondary | ICD-10-CM | POA: Diagnosis not present

## 2017-09-08 DIAGNOSIS — I63442 Cerebral infarction due to embolism of left cerebellar artery: Secondary | ICD-10-CM | POA: Diagnosis not present

## 2017-09-08 MED ORDER — ZOLPIDEM TARTRATE 10 MG PO TABS: 10.0000 mg | ORAL_TABLET | Freq: Every evening | ORAL | 0 refills | Status: DC | PRN

## 2017-09-08 NOTE — Telephone Encounter (Signed)
Copied from CRM [phone]. Topic: Quick Communication - See Telephone Encounter >> Sep 08, 2017  1:23 PM Raquel SarnaHayes, Teresa G wrote: Pt requesting her zolpidem (AMBIEN) 10 MG tablet be filled today since she as been out for 3 days.  Pt called pharmacy for refill on Friday.

## 2017-09-08 NOTE — Telephone Encounter (Signed)
Refill request was sent to Dr. [name] Sowles for approval and submission.  

## 2017-09-09 ENCOUNTER — Encounter: Payer: Self-pay | Admitting: Family Medicine

## 2017-09-09 DIAGNOSIS — Z8669 Personal history of other diseases of the nervous system and sense organs: Secondary | ICD-10-CM | POA: Insufficient documentation

## 2017-09-09 DIAGNOSIS — Z87898 Personal history of other specified conditions: Secondary | ICD-10-CM | POA: Insufficient documentation

## 2017-09-10 ENCOUNTER — Other Ambulatory Visit: Payer: Self-pay

## 2017-09-10 ENCOUNTER — Other Ambulatory Visit: Payer: Self-pay | Admitting: Family Medicine

## 2017-09-10 DIAGNOSIS — R1010 Upper abdominal pain, unspecified: Secondary | ICD-10-CM

## 2017-09-11 ENCOUNTER — Encounter: Payer: Self-pay | Admitting: Family Medicine

## 2017-09-12 DIAGNOSIS — D72829 Elevated white blood cell count, unspecified: Secondary | ICD-10-CM | POA: Diagnosis not present

## 2017-09-12 DIAGNOSIS — E569 Vitamin deficiency, unspecified: Secondary | ICD-10-CM | POA: Diagnosis not present

## 2017-09-12 DIAGNOSIS — Z8673 Personal history of transient ischemic attack (TIA), and cerebral infarction without residual deficits: Secondary | ICD-10-CM | POA: Diagnosis not present

## 2017-09-12 DIAGNOSIS — E538 Deficiency of other specified B group vitamins: Secondary | ICD-10-CM | POA: Diagnosis not present

## 2017-09-12 DIAGNOSIS — E781 Pure hyperglyceridemia: Secondary | ICD-10-CM | POA: Diagnosis not present

## 2017-09-12 DIAGNOSIS — R7303 Prediabetes: Secondary | ICD-10-CM | POA: Diagnosis not present

## 2017-09-12 DIAGNOSIS — R100 Acute abdomen: Secondary | ICD-10-CM | POA: Diagnosis not present

## 2017-09-12 DIAGNOSIS — R4189 Other symptoms and signs involving cognitive functions and awareness: Secondary | ICD-10-CM | POA: Diagnosis not present

## 2017-09-12 DIAGNOSIS — E785 Hyperlipidemia, unspecified: Secondary | ICD-10-CM | POA: Diagnosis not present

## 2017-09-13 LAB — CBC WITH DIFFERENTIAL/PLATELET
BASOS ABS: 0 10*3/uL (ref 0.0–0.2)
Basos: 0 %
EOS (ABSOLUTE): 0.1 10*3/uL (ref 0.0–0.4)
Eos: 1 %
Hematocrit: 39.4 % (ref 34.0–46.6)
Hemoglobin: 13 g/dL (ref 11.1–15.9)
Immature Grans (Abs): 0 10*3/uL (ref 0.0–0.1)
Immature Granulocytes: 0 %
LYMPHS ABS: 1.6 10*3/uL (ref 0.7–3.1)
Lymphs: 18 %
MCH: 31 pg (ref 26.6–33.0)
MCHC: 33 g/dL (ref 31.5–35.7)
MCV: 94 fL (ref 79–97)
MONOCYTES: 3 %
MONOS ABS: 0.3 10*3/uL (ref 0.1–0.9)
Neutrophils Absolute: 6.6 10*3/uL (ref 1.4–7.0)
Neutrophils: 78 %
Platelets: 224 10*3/uL (ref 150–450)
RBC: 4.2 x10E6/uL (ref 3.77–5.28)
RDW: 13.7 % (ref 12.3–15.4)
WBC: 8.5 10*3/uL (ref 3.4–10.8)

## 2017-09-13 LAB — VITAMIN B12: VITAMIN B 12: 368 pg/mL (ref 232–1245)

## 2017-09-13 LAB — COMPREHENSIVE METABOLIC PANEL
ALK PHOS: 69 IU/L (ref 39–117)
ALT: 13 IU/L (ref 0–32)
AST: 12 IU/L (ref 0–40)
Albumin/Globulin Ratio: 1.8 (ref 1.2–2.2)
Albumin: 4.4 g/dL (ref 3.5–5.5)
BILIRUBIN TOTAL: 0.4 mg/dL (ref 0.0–1.2)
BUN/Creatinine Ratio: 16 (ref 9–23)
BUN: 18 mg/dL (ref 6–24)
CHLORIDE: 104 mmol/L (ref 96–106)
CO2: 23 mmol/L (ref 20–29)
Calcium: 9 mg/dL (ref 8.7–10.2)
Creatinine, Ser: 1.12 mg/dL — ABNORMAL HIGH (ref 0.57–1.00)
GFR calc Af Amer: 68 mL/min/{1.73_m2} (ref >59)
GFR calc non Af Amer: 59 mL/min/{1.73_m2} — ABNORMAL LOW (ref >59)
GLUCOSE: 81 mg/dL (ref 65–99)
Globulin, Total: 2.4 g/dL (ref 1.5–4.5)
Potassium: 4.3 mmol/L (ref 3.5–5.2)
Sodium: 141 mmol/L (ref 134–144)
Total Protein: 6.8 g/dL (ref 6.0–8.5)

## 2017-09-13 LAB — LIPID PANEL
CHOL/HDL RATIO: 3.7 ratio (ref 0.0–4.4)
CHOLESTEROL TOTAL: 161 mg/dL (ref 100–199)
HDL: 43 mg/dL (ref >39)
LDL CALC: 69 mg/dL (ref 0–99)
Triglycerides: 245 mg/dL — ABNORMAL HIGH (ref 0–149)
VLDL Cholesterol Cal: 49 mg/dL — ABNORMAL HIGH (ref 5–40)

## 2017-09-13 LAB — TSH: TSH: 1.97 u[IU]/mL (ref 0.450–4.500)

## 2017-09-13 LAB — LIPASE: LIPASE: 24 U/L (ref 14–72)

## 2017-09-13 LAB — HEMOGLOBIN A1C
ESTIMATED AVERAGE GLUCOSE: 103 mg/dL
Hgb A1c MFr Bld: 5.2 % (ref 4.8–5.6)

## 2017-09-13 LAB — VITAMIN D 25 HYDROXY (VIT D DEFICIENCY, FRACTURES): VIT D 25 HYDROXY: 17.2 ng/mL — AB (ref 30.0–100.0)

## 2017-09-15 ENCOUNTER — Other Ambulatory Visit: Payer: Self-pay | Admitting: Family Medicine

## 2017-09-15 ENCOUNTER — Encounter
Admission: RE | Admit: 2017-09-15 | Discharge: 2017-09-15 | Disposition: A | Discharge status: Home / Self Care | Payer: BLUE CROSS/BLUE SHIELD | Source: Ambulatory Visit | Attending: Family Medicine | Admitting: Family Medicine

## 2017-09-15 DIAGNOSIS — K30 Functional dyspepsia: Secondary | ICD-10-CM | POA: Diagnosis not present

## 2017-09-15 DIAGNOSIS — R109 Unspecified abdominal pain: Secondary | ICD-10-CM | POA: Diagnosis not present

## 2017-09-15 MED ORDER — TECHNETIUM TC 99M MEBROFENIN IV KIT
5.3860 | PACK | Freq: Once | INTRAVENOUS | Status: AC | PRN
  Administered 2017-09-15: 5.386 via INTRAVENOUS

## 2017-09-15 MED ORDER — VITAMIN D (ERGOCALCIFEROL) 1.25 MG (50000 UNIT) PO CAPS
50000.0000 [IU] | ORAL_CAPSULE | ORAL | 0 refills | Status: DC
Start: 2017-09-15 — End: 2017-11-06

## 2017-09-16 DIAGNOSIS — G4733 Obstructive sleep apnea (adult) (pediatric): Secondary | ICD-10-CM | POA: Diagnosis not present

## 2017-09-18 LAB — PROTEIN S, TOTAL: Protein S Ag, Total: 80 % (ref 60–150)

## 2017-09-18 LAB — PROTEIN C, TOTAL: PROTEIN C ANTIGEN: 88 % (ref 60–150)

## 2017-09-18 LAB — FACTOR 5 LEIDEN

## 2017-09-22 DIAGNOSIS — L719 Rosacea, unspecified: Secondary | ICD-10-CM | POA: Diagnosis not present

## 2017-09-24 ENCOUNTER — Other Ambulatory Visit: Payer: Self-pay | Admitting: Family Medicine

## 2017-09-24 DIAGNOSIS — G8929 Other chronic pain: Secondary | ICD-10-CM

## 2017-09-24 DIAGNOSIS — I639 Cerebral infarction, unspecified: Secondary | ICD-10-CM | POA: Diagnosis not present

## 2017-09-24 DIAGNOSIS — I6340 Cerebral infarction due to embolism of unspecified cerebral artery: Secondary | ICD-10-CM | POA: Diagnosis not present

## 2017-09-24 DIAGNOSIS — Z6826 Body mass index (BMI) 26.0-26.9, adult: Secondary | ICD-10-CM | POA: Diagnosis not present

## 2017-09-24 DIAGNOSIS — M542 Cervicalgia: Principal | ICD-10-CM

## 2017-09-24 DIAGNOSIS — G4730 Sleep apnea, unspecified: Secondary | ICD-10-CM | POA: Insufficient documentation

## 2017-09-25 ENCOUNTER — Other Ambulatory Visit: Payer: Self-pay | Admitting: Family Medicine

## 2017-09-25 DIAGNOSIS — F411 Generalized anxiety disorder: Secondary | ICD-10-CM

## 2017-09-27 ENCOUNTER — Emergency Department
Admission: EM | Admit: 2017-09-27 | Discharge: 2017-09-28 | Disposition: A | Discharge status: Nursing Home (Includes ICF) | Payer: BLUE CROSS/BLUE SHIELD | Attending: Emergency Medicine | Admitting: Emergency Medicine

## 2017-09-27 ENCOUNTER — Other Ambulatory Visit: Payer: Self-pay

## 2017-09-27 ENCOUNTER — Encounter: Payer: Self-pay | Admitting: Emergency Medicine

## 2017-09-27 ENCOUNTER — Emergency Department: Payer: BLUE CROSS/BLUE SHIELD

## 2017-09-27 DIAGNOSIS — Z79899 Other long term (current) drug therapy: Secondary | ICD-10-CM | POA: Diagnosis not present

## 2017-09-27 DIAGNOSIS — Z7902 Long term (current) use of antithrombotics/antiplatelets: Secondary | ICD-10-CM | POA: Insufficient documentation

## 2017-09-27 DIAGNOSIS — Z7982 Long term (current) use of aspirin: Secondary | ICD-10-CM | POA: Insufficient documentation

## 2017-09-27 DIAGNOSIS — Z4682 Encounter for fitting and adjustment of non-vascular catheter: Secondary | ICD-10-CM | POA: Diagnosis not present

## 2017-09-27 DIAGNOSIS — G40901 Epilepsy, unspecified, not intractable, with status epilepticus: Secondary | ICD-10-CM

## 2017-09-27 DIAGNOSIS — Z8673 Personal history of transient ischemic attack (TIA), and cerebral infarction without residual deficits: Secondary | ICD-10-CM | POA: Insufficient documentation

## 2017-09-27 DIAGNOSIS — Z978 Presence of other specified devices: Secondary | ICD-10-CM

## 2017-09-27 DIAGNOSIS — R569 Unspecified convulsions: Secondary | ICD-10-CM | POA: Diagnosis not present

## 2017-09-27 LAB — CBC
HEMATOCRIT: 39.2 % (ref 35.0–47.0)
HEMOGLOBIN: 13.7 g/dL (ref 12.0–16.0)
MCH: 31.8 pg (ref 26.0–34.0)
MCHC: 34.9 g/dL (ref 32.0–36.0)
MCV: 90.9 fL (ref 80.0–100.0)
Platelets: 299 10*3/uL (ref 150–440)
RBC: 4.31 MIL/uL (ref 3.80–5.20)
RDW: 13.4 % (ref 11.5–14.5)
WBC: 12 10*3/uL — ABNORMAL HIGH (ref 3.6–11.0)

## 2017-09-27 LAB — APTT: aPTT: 29 seconds (ref 24–36)

## 2017-09-27 LAB — COMPREHENSIVE METABOLIC PANEL
ALBUMIN: 4.4 g/dL (ref 3.5–5.0)
ALK PHOS: 62 U/L (ref 38–126)
ALT: 15 U/L (ref 0–44)
ANION GAP: 10 (ref 5–15)
AST: 24 U/L (ref 15–41)
BILIRUBIN TOTAL: 0.6 mg/dL (ref 0.3–1.2)
BUN: 20 mg/dL (ref 6–20)
CALCIUM: 9.1 mg/dL (ref 8.9–10.3)
CO2: 21 mmol/L — ABNORMAL LOW (ref 22–32)
Chloride: 109 mmol/L (ref 98–111)
Creatinine, Ser: 1.12 mg/dL — ABNORMAL HIGH (ref 0.44–1.00)
GFR calc Af Amer: >60 mL/min (ref >60)
GFR, EST NON AFRICAN AMERICAN: 58 mL/min — AB (ref >60)
Glucose, Bld: 109 mg/dL — ABNORMAL HIGH (ref 70–99)
Potassium: 3.9 mmol/L (ref 3.5–5.1)
Sodium: 140 mmol/L (ref 135–145)
TOTAL PROTEIN: 7.2 g/dL (ref 6.5–8.1)

## 2017-09-27 LAB — URINALYSIS, ROUTINE W REFLEX MICROSCOPIC
Bacteria, UA: NONE SEEN
Bilirubin Urine: NEGATIVE
Glucose, UA: NEGATIVE mg/dL
Ketones, ur: NEGATIVE mg/dL
Leukocytes, UA: NEGATIVE
NITRITE: NEGATIVE
Protein, ur: NEGATIVE mg/dL
SPECIFIC GRAVITY, URINE: 1.019 (ref 1.005–1.030)
pH: 5 (ref 5.0–8.0)

## 2017-09-27 LAB — PROTIME-INR
INR: 0.99
Prothrombin Time: 13 seconds (ref 11.4–15.2)

## 2017-09-27 LAB — DIFFERENTIAL
Basophils Absolute: 0.1 10*3/uL (ref 0–0.1)
Basophils Relative: 0 %
EOS PCT: 1 %
Eosinophils Absolute: 0.1 10*3/uL (ref 0–0.7)
LYMPHS ABS: 4.2 10*3/uL — AB (ref 1.0–3.6)
LYMPHS PCT: 35 %
MONO ABS: 0.6 10*3/uL (ref 0.2–0.9)
MONOS PCT: 5 %
NEUTROS PCT: 59 %
Neutro Abs: 7 10*3/uL — ABNORMAL HIGH (ref 1.4–6.5)

## 2017-09-27 LAB — TROPONIN I: Troponin I: <0.03 ng/mL (ref <0.03)

## 2017-09-27 LAB — ETHANOL: Alcohol, Ethyl (B): 13 mg/dL — ABNORMAL HIGH (ref <10)

## 2017-09-27 LAB — GLUCOSE, CAPILLARY: Glucose-Capillary: 105 mg/dL — ABNORMAL HIGH (ref 70–99)

## 2017-09-27 MED ORDER — LORAZEPAM 2 MG/ML IJ SOLN
INTRAMUSCULAR | Status: AC
  Filled 2017-09-27: qty 1

## 2017-09-27 MED ORDER — PROCHLORPERAZINE EDISYLATE 10 MG/2ML IJ SOLN: 10.0000 mg | Freq: Once | INTRAMUSCULAR | Status: DC

## 2017-09-27 MED ORDER — LORAZEPAM 2 MG/ML IJ SOLN
2.0000 mg | Freq: Once | INTRAMUSCULAR | Status: AC
  Administered 2017-09-27: 2 mg via INTRAVENOUS

## 2017-09-27 MED ORDER — SUCCINYLCHOLINE CHLORIDE 20 MG/ML IJ SOLN
160.0000 mg | Freq: Once | INTRAMUSCULAR | Status: DC
  Administered 2017-09-28: 160 mg via INTRAVENOUS

## 2017-09-27 MED ORDER — IOPAMIDOL (ISOVUE-370) INJECTION 76%
75.0000 mL | Freq: Once | INTRAVENOUS | Status: AC | PRN
  Administered 2017-09-27: 75 mL via INTRAVENOUS

## 2017-09-27 MED ORDER — SODIUM CHLORIDE 0.9 % IV SOLN
2.0000 mg/h | INTRAVENOUS | Status: DC
  Filled 2017-09-27: qty 10

## 2017-09-27 MED ORDER — LORAZEPAM 2 MG/ML IJ SOLN: 2.0000 mg | Freq: Once | INTRAMUSCULAR | Status: DC

## 2017-09-27 MED ORDER — SODIUM CHLORIDE 0.9 % IV BOLUS
1000.0000 mL | Freq: Once | INTRAVENOUS | Status: AC
  Administered 2017-09-28: 1000 mL via INTRAVENOUS

## 2017-09-27 MED ORDER — KETAMINE HCL 10 MG/ML IJ SOLN: 100.0000 mg | Freq: Once | INTRAMUSCULAR | Status: DC

## 2017-09-27 MED ORDER — FENTANYL 2500MCG IN NS 250ML (10MCG/ML) PREMIX INFUSION
100.0000 ug/h | INTRAVENOUS | Status: DC
  Filled 2017-09-27: qty 250

## 2017-09-27 MED ORDER — DIPHENHYDRAMINE HCL 50 MG/ML IJ SOLN: 25.0000 mg | Freq: Once | INTRAMUSCULAR | Status: DC

## 2017-09-27 MED ORDER — SODIUM CHLORIDE 0.9 % IV SOLN
1000.0000 mg | Freq: Once | INTRAVENOUS | Status: AC
  Administered 2017-09-28: 1000 mg via INTRAVENOUS
  Filled 2017-09-27: qty 20

## 2017-09-27 NOTE — ED Provider Notes (Signed)
[facility] Emergency Department Provider Note  ____________________________________________   First MD Initiated Contact with Patient 09/27/17 2307     (approximate)  I have reviewed the triage vital signs and the nursing notes.   HISTORY  Chief Complaint Seizures  Level 5 exemption history limited by the patient's clinical condition  HPI Alicia [name] is a 47 y.o. female is brought to the emergency department by her husband for sudden onset severe headache as well as seizures.  This began suddenly at 10:15 PM tonight.  The patient had a generalized tonic-clonic seizure at home and has been confused ever since.  She had another seizure in triage and was taken directly back.  According to the husband the patient has a past medical history of stroke last year as well as previous seizure disorder although she is not currently on any antiepileptics.   Past Medical History:  Diagnosis Date  . Anemia   . Anxiety   . Depression   . GERD (gastroesophageal reflux disease)   . Hyperlipidemia   . IBS (irritable bowel syndrome)   . Migraine   . Psychogenic nonepileptic seizure    hx/notes 12/19/2016  . Restless leg syndrome   . Stroke ([place]) 12/18/2016   Acute arterial ischemic stroke, multifocal, posterior circulation Archie Endo 12/19/2016    Patient Active Problem List   Diagnosis Date Noted  . History of stroke 05/30/2017  . Hyperlipidemia 05/30/2017  . Migraine without aura and without status migrainosus, not intractable 05/27/2017  . Pain in right knee 01/21/2017  . Psychogenic nonepileptic seizure   . Chronic pain syndrome   . Restless leg syndrome   . Chronic prescription benzodiazepine use 01/20/2016  . Leukocytosis 01/20/2016  . Seizure-like activity ([place])   . GAD (generalized anxiety disorder) 02/14/2015  . Neck pain 02/14/2015  . History of hysterectomy 02/14/2015  . S/P hysterectomy 02/02/2015  . Grieving 12/16/2014  . Iron  deficiency anemia due to chronic blood loss 08/29/2014  . Insomnia, persistent 08/14/2014  . Major depression ([place]) 08/14/2014  . Temporomandibular joint sounds on opening and/or closing the jaw 08/14/2014  . Degenerative disc disease, lumbar 08/14/2014  . Bleeding internal hemorrhoids 08/14/2014  . Gastric reflux 08/14/2014  . Blood glucose elevated 08/14/2014  . Irritable bowel syndrome with constipation 08/14/2014  . Hypertriglyceridemia 08/14/2014  . Overweight 08/14/2014  . Restless legs syndrome 08/14/2014  . Tinnitus 08/14/2014  . Vitamin D deficiency 08/14/2014  . Tachycardia 11/25/2012  . DOE (dyspnea on exertion) 11/06/2012  . Previous cesarean delivery, delivered, with or without mention of antepartum condition 01/19/2012    Past Surgical History:  Procedure Laterality Date  . ABDOMINOPLASTY  Feb. 2015  . CESAREAN SECTION  2011  . COLONOSCOPY WITH PROPOFOL N/A 11/26/2016   Procedure: COLONOSCOPY WITH PROPOFOL;  Surgeon: Jonathon Bellows, MD;  Location: [facility] ENDOSCOPY;  Service: Gastroenterology;  Laterality: N/A;  . CYSTOSCOPY  02/02/2015   Procedure: CYSTOSCOPY;  Surgeon: Gae Dry, MD;  Location: [facility] ORS;  Service: Gynecology;;  . Brigitte Pulse AND CURETTAGE OF UTERUS  2003, 2005, 2008  . ESOPHAGOGASTRODUODENOSCOPY (EGD) WITH PROPOFOL N/A 11/26/2016   Procedure: ESOPHAGOGASTRODUODENOSCOPY (EGD) WITH PROPOFOL;  Surgeon: Jonathon Bellows, MD;  Location[facility] ENDOSCOPY;  Service: Gastroenterology;  Laterality: N/A;  . HERNIA REPAIR  6160   Umbilical  . KNEE ARTHROSCOPY Right 2006  . LAPAROSCOPIC BILATERAL SALPINGECTOMY Bilateral 02/02/2015   Procedure: LAPAROSCOPIC BILATERAL SALPINGECTOMY;  Surgeon: Gae Dry, MD;  Location: [facility] ORS;  Service: Gynecology;  Laterality:  Bilateral;  . LAPAROSCOPIC HYSTERECTOMY N/A 02/02/2015   Procedure: HYSTERECTOMY TOTAL LAPAROSCOPIC;  Surgeon: Gae Dry, MD;  Location: [facility] ORS;  Service: Gynecology;  Laterality: N/A;  . LOOP  RECORDER INSERTION N/A 12/20/2016   Procedure: LOOP RECORDER INSERTION;  Surgeon: Constance Haw, MD;  Location: [place] CV LAB;  Service: Cardiovascular;  Laterality: N/A;  . REDUCTION MAMMAPLASTY Bilateral December 2015  . TEE WITHOUT CARDIOVERSION N/A 12/20/2016   Procedure: TRANSESOPHAGEAL ECHOCARDIOGRAM (TEE);  Surgeon: Taliana Spark, MD;  Location: [facility] ENDOSCOPY;  Service: Cardiovascular;  Laterality: N/A;    Prior to Admission medications   Medication Sig Start Date End Date Taking? Authorizing Provider  ALPRAZOLAM XR 1 MG 24 hr tablet Take 1 tablet (1 mg total) by mouth daily. 08/12/17   Steele Sizer, MD  aspirin EC 81 MG tablet Take 1 tablet (81 mg total) by mouth daily. 01/23/17   Garvin Fila, MD  carvedilol (COREG) 3.125 MG tablet Take 1 tablet (3.125 mg total) by mouth 2 (two) times daily with a meal. 05/27/17   Ancil Boozer, Drue Stager, MD  clopidogrel (PLAVIX) 75 MG tablet Take 1 tablet (75 mg total) by mouth daily. 09/03/17   Steele Sizer, MD  Coenzyme Q10 (COQ-10) 200 MG CAPS Take 1 capsule by mouth daily.    [provider]  cyclobenzaprine (FLEXERIL) 10 MG tablet Take 1 tablet (10 mg total) by mouth daily as needed for muscle spasms. 09/24/17   Steele Sizer, MD  Icosapent Ethyl (VASCEPA) 1 g CAPS Take 2 g 2 (two) times daily by mouth. 12/25/16   Steele Sizer, MD  pantoprazole (PROTONIX) 40 MG tablet Take 1 tablet (40 mg total) by mouth daily. 08/12/17   Steele Sizer, MD  PARoxetine (PAXIL) 40 MG tablet Take 1 tablet (40 mg total) by mouth every morning. 08/12/17   Ancil Boozer, Drue Stager, MD  rosuvastatin (CRESTOR) 5 MG tablet Take 0.5 tablets (2.5 mg total) by mouth daily. 01/23/17   Garvin Fila, MD  Vitamin D, Ergocalciferol, (DRISDOL) 50000 units CAPS capsule Take 1 capsule (50,000 Units total) by mouth every 7 (seven) days. 09/15/17   Steele Sizer, MD  zolpidem (AMBIEN) 10 MG tablet Take 1 tablet (10 mg total) by mouth at bedtime as needed for sleep.  09/08/17   Steele Sizer, MD    Allergies Azithromycin and Penicillins  Family History  Problem Relation Age of Onset  . Hypertension Mother   . Hyperlipidemia Mother   . Heart Problems Father        hole in heart and lower ventricles reversed  . Prostate cancer Maternal Grandfather   . Von Willebrand disease Maternal Uncle     Social History Social History   Tobacco Use  . Smoking status: Never Smoker  . Smokeless tobacco: Never Used  Substance Use Topics  . Alcohol use: No    Alcohol/week: 0.0 standard drinks  . Drug use: No    Review of Systems Level 5 exemption history limited by the patient's clinical condition ____________________________________________   PHYSICAL EXAM:  VITAL SIGNS: ED Triage Vitals  Enc Vitals Group     BP      Pulse      Resp      Temp      Temp src      SpO2      Weight      Height      Head Circumference      Peak Flow      Pain Score  Pain Loc      Pain Edu?      Excl. in [place]?     Constitutional: Actively seizing leaning off to the left side only her left arm and left leg are moving her right arm and right leg have no strength not even to painful stimulus Eyes: PERRL EOMI. dilated and sluggish Head: Atraumatic. Nose: No congestion/rhinnorhea. Mouth/Throat: No trismus Neck: No stridor.   Cardiovascular: Tachycardic rate, regular rhythm. Grossly normal heart sounds.  Good peripheral circulation. Respiratory: His respiratory effort although taking very shallow breaths moving limited amounts of air Gastrointestinal: Soft nontender Musculoskeletal: No lower extremity edema   Neurologic: Actively seizing.  Not moving her right arm or her right leg Skin:  Skin is warm, dry and intact. No rash noted. Psychiatric: Obtunded    ____________________________________________   DIFFERENTIAL includes but not limited to  Seizures, status epilepticus, psychogenic nonepileptic seizure, stroke, subarachnoid  hemorrhage ____________________________________________   LABS (all labs ordered are listed, but only abnormal results are displayed)  Labs Reviewed  GLUCOSE, CAPILLARY - Abnormal; Notable for the following components:      Result Value   Glucose-Capillary 105 (*)    All other components within normal limits  ETHANOL - Abnormal; Notable for the following components:   Alcohol, Ethyl (B) 13 (*)    All other components within normal limits  CBC - Abnormal; Notable for the following components:   WBC 12.0 (*)    All other components within normal limits  DIFFERENTIAL - Abnormal; Notable for the following components:   Neutro Abs 7.0 (*)    Lymphs Abs 4.2 (*)    All other components within normal limits  COMPREHENSIVE METABOLIC PANEL - Abnormal; Notable for the following components:   CO2 21 (*)    Glucose, Bld 109 (*)    Creatinine, Ser 1.12 (*)    GFR calc non Af Amer 58 (*)    All other components within normal limits  URINE DRUG SCREEN, QUALITATIVE ([facility] ONLY) - Abnormal; Notable for the following components:   Tricyclic, Ur Screen POSITIVE (*)    Benzodiazepine, Ur Scrn TEST NOT PERFORMED, REAGENT NOT AVAILABLE (*)    All other components within normal limits  URINALYSIS, ROUTINE W REFLEX MICROSCOPIC - Abnormal; Notable for the following components:   Color, Urine YELLOW (*)    APPearance CLEAR (*)    Hgb urine dipstick SMALL (*)    All other components within normal limits  PROTIME-INR  APTT  TROPONIN I  BLOOD GAS, ARTERIAL    Work reviewed by me with no acute disease noted __________________________________________  EKG ED ECG REPORT I, Darel Hong, the attending physician, personally viewed and interpreted this ECG.  Date: 09/27/2017 EKG Time:  Rate: 101 Rhythm: Sinus tachycardia QRS Axis: normal Intervals: normal ST/T Wave abnormalities: normal Narrative Interpretation: no evidence of acute  ischemia  ____________________________________________  RADIOLOGY  CT angiogram of the head and neck reviewed by me with no large vessel occlusion Chest x-ray reviewed by me with ET tube in adequate position ____________________________________________   PROCEDURES  Procedure(s) performed: yes  Angiocath insertion Performed by: Darel Hong  Consent: Verbal consent obtained. Risks and benefits: risks, benefits and alternatives were discussed Time out: Immediately prior to procedure a "time out" was called to verify the correct patient, procedure, equipment, support staff and site/side marked as required.  Preparation: Patient was prepped and draped in the usual sterile fashion.  Vein Location: right hand  Ultrasound Guided  Gauge: 18  Normal blood  return and flush without difficulty Patient tolerance: Patient tolerated the procedure well with no immediate complications.     .Critical Care Performed by: Darel Hong, MD Authorized by: Darel Hong, MD   Critical care provider statement:    Critical care time (minutes):  65   Critical care time was exclusive of:  Separately billable procedures and treating other patients   Critical care was necessary to treat or prevent imminent or life-threatening deterioration of the following conditions:  CNS failure or compromise   Critical care was time spent personally by me on the following activities:  Development of treatment plan with patient or surrogate, discussions with consultants, evaluation of patient's response to treatment, examination of patient, obtaining history from patient or surrogate, ordering and performing treatments and interventions, ordering and review of laboratory studies, ordering and review of radiographic studies, pulse oximetry, re-evaluation of patient's condition and review of old charts Procedure Name: Intubation Date/Time: 09/28/2017 12:39 AM Performed by: Darel Hong, MD Pre-anesthesia  Checklist: Patient identified, Patient being monitored, Emergency Drugs available, Timeout performed and Suction available Oxygen Delivery Method: Nasal cannula Preoxygenation: Pre-oxygenation with 100% oxygen Induction Type: Rapid sequence Ventilation: Mask ventilation without difficulty Laryngoscope Size: Mac and 4 Grade View: Grade II Tube size: 7.5 mm Number of attempts: 1 Placement Confirmation: ETT inserted through vocal cords under direct vision,  CO2 detector and Breath sounds checked- equal and bilateral Secured at: 22 cm Tube secured with: ETT holder     Angiocath insertion Performed by: Darel Hong  Consent: Verbal consent obtained. Risks and benefits: risks, benefits and alternatives were discussed Time out: Immediately prior to procedure a "time out" was called to verify the correct patient, procedure, equipment, support staff and site/side marked as required.  Preparation: Patient was prepped and draped in the usual sterile fashion.  Vein Location: left EJ   Gauge: 20  Normal blood return and flush without difficulty Patient tolerance: Patient tolerated the procedure well with no immediate complications.     Critical Care performed: yes ____________________________________________   INITIAL IMPRESSION / ASSESSMENT AND PLAN / ED COURSE  Pertinent labs & imaging results that were available during my care of the patient were reviewed by me and considered in my medical decision making (see chart for details).   As part of my medical decision making, I reviewed the following data within the [place] History obtained from family if available, nursing notes, old chart and ekg, as well as notes from prior ED visits.  The patient arrives actively seizing and leaning off to the left.  She does not seem to have any strength in her right lower extremity or her right upper extremity even with painful stimulus.  Husband at bedside able to provide  collateral history and her last known well time is 10:15 PM at which time she had a sudden onset severe headache and had a seizure.  She does have a recent history of stroke for which she takes Plavix.  He is not sure if she takes any antiepileptics but he does say she has had double seizures in the past.  Quick bedside glucose was 105.  Establish IV access and gave 2 mg lorazepam with minimal effect.  2 more milligrams of lorazepam.  The patient is still intermittently shaking.  On chart review she does have a history of what is felt to be psychogenic nonepileptic seizures however she has had a true strokes is difficult to assess out.  Given her right-sided weakness I do  have concern for large vessel occlusion so she will be taken emergently for CT angiogram.  I have ordered Dilantin as a second line agent as we have already used 2 doses of benzodiazepines however she continues to seize she will require endotracheal intubation.     ----------------------------------------- 1:39 AM on 09/28/2017 -----------------------------------------  After the patient came back from CT angiogram and they were negative she continued to have another seizure at which point we gave another 2 mg of lorazepam.  She was not protecting her airway and she was intubated for predicted clinical course.  I then discussed with the telemetry neurologist and together we agree as the patient has wide open vessels and is seizing and had a headache she would not be a TPA candidate.  We do not have the ability to perform 24-hour EEG at our hospital so I asked the patient's husband where he would like her transferred and he requested [facility].  I spoke with neurologist Nancy Fetter who has graciously agreed to admit the patient to his service.  The patient is currently on high-dose midozalam infusion along with fentanyl infusion and is adequately sedated.  At this point she has profound left lower extremity ankle clonus, is hyperreflexic in her  left leg and left arm.  Otherwise localizing with all 4.  She remains in critical condition and will be transported via [place] [facility].  FINAL CLINICAL IMPRESSION(S) / ED DIAGNOSES  Final diagnoses:  Endotracheally intubated  Status epilepticus ([place])      NEW MEDICATIONS STARTED DURING THIS VISIT:  New Prescriptions   No medications on file     Note:  This document was prepared using Dragon voice recognition software and may include unintentional dictation errors.     Darel Hong, MD 09/28/17 0140

## 2017-09-27 NOTE — ED Triage Notes (Signed)
Pt arrives via POV with c/o seizure activity at 2015. Pt's husband reports that pt has another seizure in the lobby. Pt is laying in bed at this time but is able to move all extremities at this time.

## 2017-09-28 ENCOUNTER — Ambulatory Visit (HOSPITAL_COMMUNITY)
Admission: AD | Admit: 2017-09-28 | Discharge: 2017-09-28 | Disposition: A | Discharge status: Home / Self Care | Payer: BLUE CROSS/BLUE SHIELD | Source: Other Acute Inpatient Hospital | Attending: Emergency Medicine | Admitting: Emergency Medicine

## 2017-09-28 ENCOUNTER — Emergency Department: Payer: BLUE CROSS/BLUE SHIELD

## 2017-09-28 DIAGNOSIS — G4733 Obstructive sleep apnea (adult) (pediatric): Secondary | ICD-10-CM | POA: Diagnosis not present

## 2017-09-28 DIAGNOSIS — F329 Major depressive disorder, single episode, unspecified: Secondary | ICD-10-CM | POA: Diagnosis not present

## 2017-09-28 DIAGNOSIS — Z7982 Long term (current) use of aspirin: Secondary | ICD-10-CM | POA: Diagnosis not present

## 2017-09-28 DIAGNOSIS — G4700 Insomnia, unspecified: Secondary | ICD-10-CM | POA: Diagnosis not present

## 2017-09-28 DIAGNOSIS — E785 Hyperlipidemia, unspecified: Secondary | ICD-10-CM | POA: Diagnosis not present

## 2017-09-28 DIAGNOSIS — Z8673 Personal history of transient ischemic attack (TIA), and cerebral infarction without residual deficits: Secondary | ICD-10-CM | POA: Diagnosis not present

## 2017-09-28 DIAGNOSIS — G40901 Epilepsy, unspecified, not intractable, with status epilepticus: Secondary | ICD-10-CM | POA: Diagnosis not present

## 2017-09-28 DIAGNOSIS — Z6827 Body mass index (BMI) 27.0-27.9, adult: Secondary | ICD-10-CM | POA: Diagnosis not present

## 2017-09-28 DIAGNOSIS — Z8669 Personal history of other diseases of the nervous system and sense organs: Secondary | ICD-10-CM | POA: Diagnosis not present

## 2017-09-28 DIAGNOSIS — Z4682 Encounter for fitting and adjustment of non-vascular catheter: Secondary | ICD-10-CM | POA: Diagnosis not present

## 2017-09-28 DIAGNOSIS — I639 Cerebral infarction, unspecified: Secondary | ICD-10-CM | POA: Diagnosis not present

## 2017-09-28 DIAGNOSIS — J9600 Acute respiratory failure, unspecified whether with hypoxia or hypercapnia: Secondary | ICD-10-CM | POA: Diagnosis not present

## 2017-09-28 DIAGNOSIS — F450 Somatization disorder: Secondary | ICD-10-CM | POA: Diagnosis not present

## 2017-09-28 DIAGNOSIS — G43909 Migraine, unspecified, not intractable, without status migrainosus: Secondary | ICD-10-CM | POA: Diagnosis not present

## 2017-09-28 DIAGNOSIS — G4089 Other seizures: Secondary | ICD-10-CM | POA: Diagnosis not present

## 2017-09-28 DIAGNOSIS — R569 Unspecified convulsions: Secondary | ICD-10-CM | POA: Diagnosis not present

## 2017-09-28 DIAGNOSIS — Z880 Allergy status to penicillin: Secondary | ICD-10-CM | POA: Diagnosis not present

## 2017-09-28 DIAGNOSIS — R51 Headache: Secondary | ICD-10-CM | POA: Diagnosis not present

## 2017-09-28 DIAGNOSIS — F419 Anxiety disorder, unspecified: Secondary | ICD-10-CM | POA: Diagnosis not present

## 2017-09-28 DIAGNOSIS — T424X5A Adverse effect of benzodiazepines, initial encounter: Secondary | ICD-10-CM | POA: Diagnosis not present

## 2017-09-28 DIAGNOSIS — F445 Conversion disorder with seizures or convulsions: Secondary | ICD-10-CM | POA: Diagnosis not present

## 2017-09-28 DIAGNOSIS — Z90710 Acquired absence of both cervix and uterus: Secondary | ICD-10-CM | POA: Diagnosis not present

## 2017-09-28 LAB — BLOOD GAS, ARTERIAL
Acid-base deficit: 4 mmol/L — ABNORMAL HIGH (ref 0.0–2.0)
Bicarbonate: 19.5 mmol/L — ABNORMAL LOW (ref 20.0–28.0)
FIO2: 0.4
MECHVT: 450 mL
O2 Saturation: 99.7 %
PEEP: 5 cmH2O
Patient temperature: 37
RATE: 16 resp/min
pCO2 arterial: 30 mmHg — ABNORMAL LOW (ref 32.0–48.0)
pH, Arterial: 7.42 (ref 7.350–7.450)
pO2, Arterial: 197 mmHg — ABNORMAL HIGH (ref 83.0–108.0)

## 2017-09-28 LAB — URINE DRUG SCREEN, QUALITATIVE (ARMC ONLY)
Amphetamines, Ur Screen: NOT DETECTED
Barbiturates, Ur Screen: NOT DETECTED
CANNABINOID 50 NG, UR ~~LOC~~: NOT DETECTED
COCAINE METABOLITE, UR ~~LOC~~: NOT DETECTED
MDMA (Ecstasy)Ur Screen: NOT DETECTED
Methadone Scn, Ur: NOT DETECTED
OPIATE, UR SCREEN: NOT DETECTED
Phencyclidine (PCP) Ur S: NOT DETECTED
Tricyclic, Ur Screen: POSITIVE — AB

## 2017-09-28 MED ORDER — SODIUM CHLORIDE 0.9 % IV SOLN
75.00 | INTRAVENOUS | Status: DC
Start: ? — End: 2017-09-28

## 2017-09-28 MED ORDER — ACETAMINOPHEN 325 MG PO TABS
650.00 | ORAL_TABLET | ORAL | Status: DC
Start: ? — End: 2017-09-28

## 2017-09-28 MED ORDER — FENTANYL 2500MCG IN NS 250ML (10MCG/ML) PREMIX INFUSION
0.0000 ug/h | INTRAVENOUS | Status: DC
  Administered 2017-09-28 (×2): 100 ug/h via INTRAVENOUS
  Administered 2017-09-28: 200 ug/h via INTRAVENOUS
  Filled 2017-09-28: qty 250

## 2017-09-28 MED ORDER — ENOXAPARIN SODIUM 40 MG/0.4ML ~~LOC~~ SOLN
40.00 | SUBCUTANEOUS | Status: DC
Start: 2017-10-01 — End: 2017-09-28

## 2017-09-28 MED ORDER — DEXTROSE 10 % IV SOLN
12.50 | INTRAVENOUS | Status: DC
Start: ? — End: 2017-09-28

## 2017-09-28 MED ORDER — INSULIN REGULAR HUMAN 100 UNIT/ML IJ SOLN
0.00 | INTRAMUSCULAR | Status: DC
Start: 2017-09-29 — End: 2017-09-28

## 2017-09-28 MED ORDER — LORAZEPAM 2 MG/ML IJ SOLN
2.0000 mg | Freq: Once | INTRAMUSCULAR | Status: AC
  Administered 2017-09-27: 2 mg via INTRAVENOUS

## 2017-09-28 MED ORDER — DOCUSATE SODIUM 100 MG PO CAPS
100.00 | ORAL_CAPSULE | ORAL | Status: DC
Start: 2017-10-01 — End: 2017-09-28

## 2017-09-28 MED ORDER — VITAMIN B-12 1000 MCG PO TABS
1000.00 | ORAL_TABLET | ORAL | Status: DC
Start: 2017-10-01 — End: 2017-09-28

## 2017-09-28 MED ORDER — ATORVASTATIN CALCIUM 80 MG PO TABS
80.00 | ORAL_TABLET | ORAL | Status: DC
Start: 2017-09-30 — End: 2017-09-28

## 2017-09-28 MED ORDER — PAROXETINE HCL 20 MG PO TABS
40.00 | ORAL_TABLET | ORAL | Status: DC
Start: 2017-10-01 — End: 2017-09-28

## 2017-09-28 MED ORDER — ASPIRIN 81 MG PO CHEW
81.00 | CHEWABLE_TABLET | ORAL | Status: DC
Start: 2017-10-01 — End: 2017-09-28

## 2017-09-28 MED ORDER — MIDAZOLAM HCL 5 MG/5ML IJ SOLN: INTRAMUSCULAR | Status: AC | PRN

## 2017-09-28 MED ORDER — SODIUM CHLORIDE 0.9 % IV SOLN
0.5000 mg/h | INTRAVENOUS | Status: DC
  Administered 2017-09-28: 10 mg/h via INTRAVENOUS
  Administered 2017-09-28: 5 mg/h via INTRAVENOUS
  Filled 2017-09-28: qty 10

## 2017-09-28 MED ORDER — KETAMINE HCL 10 MG/ML IJ SOLN
INTRAMUSCULAR | Status: AC | PRN
  Administered 2017-09-28: 100 mg via INTRAVENOUS

## 2017-09-28 NOTE — Progress Notes (Signed)
Assisted with intubation at 0020 hrs. Pt placed on ventilator without incident on first attempt.  AC Vt 450  Rate 16 Peep 5 7.5 @ 23(teeth)

## 2017-09-28 NOTE — ED Notes (Signed)
I have reviewed the EMTALA and it is complete at this time.  

## 2017-09-28 NOTE — Progress Notes (Signed)
Vent rate reduced to 12 following abg results.

## 2017-09-28 NOTE — Consult Note (Signed)
[facility] TeleNeurology Consult Services   The patient was informed the neurology consult would happen via TeleHealth by way of interactive audio and visual telecommunications and consented to receiving care in this manner for acute stroke protocol. DATE: September 28, 2017  Impression: Seizure altered mental status the patient had multiple events concerning for seizure with some confusion.  She is now been intubated and sedated.  There is no evidence for large vessel occlusion on CT angiogram head and neck.  Seizure at Presentation is relative contraindication to TPA.  Given that seizures or other cause more likely than stroke at the present moment.. The patient is currently intubated and paralyzed so neurologic assessment is also clouded. The patient has a history of nonepileptic spells that have been captured and characterized on continuous EEG.  It sounds like the only different part of this what she did have some speech disturbance/confusion  prior to the onset of her typical shaking and jerking movements that she's had with prior nonepileptic events.  Would start her on Keppra 1 g twice a day.  She is already been intubated.  Can obviously titrate propofol as needed to stop spells until EEG and neurology consultation can be performed.  Not a tpa candidate due to: Patient was seizing on arrival  Symptoms (not) consistent with LVO therefore no role for interventions CT angios head and neck were unremarkable  Differential Diagnosis: Seizure, toxic metabolic altered mental status, stroke  Comments: Last known normal 22:15  door time 23:05 [facility] contacted: 23:37 [facility] at bedside: 23:40 NIHSS assessment time:23:40-pt not evaluated already intubated/sedated/parlized exam per ED MD prior to sedation  Recommendations: -Continue antiplatelet therapy patient takes aspirin and Plavix at home -Start Keppra 1 g twice a day -The patient will need inpatient neurology  consultation and EEG  Inpatient neurology consultation Inpatient stroke evaluation as per Neurology/ Internal Medicine Discussed with ED MD Please call with questions ----------------------------------------------------------------------------------------- CC seizures altered mental status  History of Present Illness  Patient is a 47 year old woman with a history of prior ischemic strokes most recently and in October 2018.  She's had a prior left PCA territory stroke affecting more vision and balance.  She takes stool antiplatelet therapy with aspirin and Plavix.  Before her stroke she began to have spells that were deemed nonepileptic in nature.  She apparently had shaking and jerking all over and had an overnight EEG in the recent past and these spells were nonepileptic in nature.  She did not take any seizure medications.  Her husband who provides all of the history said that at 22:15 she began seeming confused altered not speaking normally.  She then went on to have several shaking and jerking movements concerning for seizure.  She received multiple doses of Ativan and ultimately was intubated and paralyzed in order to able to administer more sedating medication to stop events possibly seizures.  By the time of neurologic assessment patient had already been intubated and sedated.  Diagnostic: CT head without contrast no acute changes, CT angios head and neck with no large vessel occlusion  Exam: NIH stroke scale score not performed-this will be deferred as the patient has already been intubated and sedated not be useful tool at this point in time General: Patient is intubated sedated Mental status: No commands no eye opening Eyes: Pupils   symmetric      corneals intact prior to intubation and sedation Motor: No movement after intubation and sedation prior to that was moving all 4 limbs symmetrically per report  Respiratory:  Intubated    breathing above the vent Cough/gag: These were intact  prior to intubation and sedation  Medical Decision Making: - Extensive number of diagnosis or management options are considered above. - Extensive amount of complex data reviewed. - High risk of complication and/or morbidity or mortality are associated with differential diagnostic considerations above. - There may be Uncertain outcome and increased probability of prolonged functional impairment or high probability of severe prolonged functional impairment associated with some of these differential diagnosis. Medical Data Reviewed: 1.Data reviewed include clinical labs, radiology, Medical Tests; 2.Tests results discussed w/performing or interpreting physician; 3.Obtaining/reviewing old medical records; 4.Obtaining case history from another source; 5.Independent review of image, tracing or specimen. Patient was informed the Neurology Consult would happen via telehealth (remote video) and consented to receiving care in this manner.

## 2017-09-29 DIAGNOSIS — J9600 Acute respiratory failure, unspecified whether with hypoxia or hypercapnia: Secondary | ICD-10-CM | POA: Diagnosis not present

## 2017-09-29 DIAGNOSIS — R569 Unspecified convulsions: Secondary | ICD-10-CM | POA: Diagnosis not present

## 2017-09-29 DIAGNOSIS — Z8669 Personal history of other diseases of the nervous system and sense organs: Secondary | ICD-10-CM | POA: Diagnosis not present

## 2017-09-29 DIAGNOSIS — R51 Headache: Secondary | ICD-10-CM | POA: Diagnosis not present

## 2017-09-30 DIAGNOSIS — I639 Cerebral infarction, unspecified: Secondary | ICD-10-CM | POA: Diagnosis not present

## 2017-09-30 DIAGNOSIS — Z8669 Personal history of other diseases of the nervous system and sense organs: Secondary | ICD-10-CM | POA: Diagnosis not present

## 2017-09-30 DIAGNOSIS — F445 Conversion disorder with seizures or convulsions: Secondary | ICD-10-CM | POA: Diagnosis not present

## 2017-09-30 MED ORDER — IBUPROFEN 800 MG PO TABS
800.00 | ORAL_TABLET | ORAL | Status: DC
Start: ? — End: 2017-09-30

## 2017-09-30 MED ORDER — CYCLOBENZAPRINE HCL 10 MG PO TABS
10.00 | ORAL_TABLET | ORAL | Status: DC
Start: 2017-10-01 — End: 2017-09-30

## 2017-09-30 MED ORDER — HYDROXYZINE HCL 25 MG PO TABS
25.00 | ORAL_TABLET | ORAL | Status: DC
Start: ? — End: 2017-09-30

## 2017-09-30 MED ORDER — ZOLPIDEM TARTRATE 5 MG PO TABS
10.00 | ORAL_TABLET | ORAL | Status: DC
Start: ? — End: 2017-09-30

## 2017-09-30 MED ORDER — MAGNESIUM OXIDE 400 MG PO TABS
400.00 | ORAL_TABLET | ORAL | Status: DC
Start: 2017-09-30 — End: 2017-09-30

## 2017-09-30 MED ORDER — PANTOPRAZOLE SODIUM 40 MG PO TBEC
40.00 | DELAYED_RELEASE_TABLET | ORAL | Status: DC
Start: 2017-10-01 — End: 2017-09-30

## 2017-09-30 MED ORDER — ALPRAZOLAM 0.5 MG PO TABS
1.00 | ORAL_TABLET | ORAL | Status: DC
Start: ? — End: 2017-09-30

## 2017-10-01 ENCOUNTER — Telehealth: Payer: Self-pay

## 2017-10-01 NOTE — Telephone Encounter (Signed)
Transition Care Management Follow-up Telephone Call  Date of discharge and from where: 09[facility]  How have you been since you were released from the hospital? States she is doing fine but does feel fatigue and has slight throat discomfort s/p intubation. States this is improving. Denies headache or migraine like symptoms. Speech fluent and without slurring. Also denies having any further problems with moving her L arm  Any questions or concerns? No   Items Reviewed:  Did the pt receive and understand the discharge instructions provided? Yes   Medications obtained and verified? Yes   Any new allergies since your discharge? No   Dietary orders reviewed? Yes  Do you have support at home? Yes   Other (ie: DME, Home Health, etc) N/A  Functional Questionnaire: (I = Independent and D = Dependent) ADL's: I  Bathing/Dressing- I   Meal Prep- I  Eating- I  Maintaining continence- I  Transferring/Ambulation- I  Managing Meds- I   Follow up appointments reviewed:    PCP Hospital f/u appt confirmed? At the time of this entry, pt has not yet scheduled her hosp f/u appt with Dr. Carlynn PurlSowles. Message sent to her CMA's.   Specialist Hospital f/u appt confirmed? N/A   Are transportation arrangements needed? No   If their condition worsens, is the pt aware to call  their PCP or go to the ED? Yes  Was the patient provided with contact information for the PCP's office or ED? Yes  Was the pt encouraged to call back with questions or concerns? Yes

## 2017-10-03 ENCOUNTER — Other Ambulatory Visit: Payer: Self-pay

## 2017-10-03 DIAGNOSIS — I639 Cerebral infarction, unspecified: Secondary | ICD-10-CM

## 2017-10-03 MED ORDER — CLOPIDOGREL BISULFATE 75 MG PO TABS: 75.0000 mg | ORAL_TABLET | Freq: Every day | ORAL | 0 refills | Status: DC

## 2017-10-03 NOTE — Telephone Encounter (Signed)
Refill request for general medication. Plavix to [facility].   Last office visit: 09/03/2017   Follow up on 10/07/2017

## 2017-10-06 ENCOUNTER — Ambulatory Visit (INDEPENDENT_AMBULATORY_CARE_PROVIDER_SITE_OTHER): Payer: BLUE CROSS/BLUE SHIELD | Admitting: *Deleted

## 2017-10-06 DIAGNOSIS — I639 Cerebral infarction, unspecified: Secondary | ICD-10-CM

## 2017-10-07 ENCOUNTER — Encounter: Payer: Self-pay | Admitting: Family Medicine

## 2017-10-07 ENCOUNTER — Ambulatory Visit: Payer: BLUE CROSS/BLUE SHIELD | Admitting: Family Medicine

## 2017-10-07 VITALS — BP 96/64 | HR 97 | Temp 98.2°F | Resp 16 | Ht 66.0 in | Wt 172.1 lb

## 2017-10-07 DIAGNOSIS — Z09 Encounter for follow-up examination after completed treatment for conditions other than malignant neoplasm: Secondary | ICD-10-CM

## 2017-10-07 DIAGNOSIS — R944 Abnormal results of kidney function studies: Secondary | ICD-10-CM

## 2017-10-07 DIAGNOSIS — F4329 Adjustment disorder with other symptoms: Secondary | ICD-10-CM

## 2017-10-07 DIAGNOSIS — R569 Unspecified convulsions: Secondary | ICD-10-CM

## 2017-10-07 DIAGNOSIS — D649 Anemia, unspecified: Secondary | ICD-10-CM

## 2017-10-07 NOTE — Progress Notes (Signed)
Name: Alicia [name] Note Schmuck   MRN: [id number]    DOB: 12/12/1970   Date:10/07/2017       Progress Note  Subjective  Chief Complaint  Chief Complaint  Patient presents with  . Hospitalization Follow-up    patient was told to stop taking the Plavix since it was thinning her blood too much.    HPI  Hospital Follow up: she went to [facility] because of seizure like activity on 08/10 , husband took her instead of calling EMS because would be faster to get there. At [facility] she decompensated. Got hypoxic and had to be intubated. She was transferred to [facility] to have EEG . She had 18 hours of EEG monitoring and no epileptic forms even when she had symptoms. She was discharged on 09/28/2017 and because of history of stroke she was advised to go up on Crestor and advised to stop Plavix because of mild anemia. Taking aspirin. She went back to work last week even though still feeling very tired and drained. She states she only has FMLA for a 12 month period and cannot use it again until October 2019. She states this past Saturday she started to feel nauseated, weak, dizziness, and arm went numb. She was told likely a reaction to stress.    Patient Active Problem List   Diagnosis Date Noted  . Sleep apnea 09/24/2017  . History of pseudoseizure 09/09/2017  . History of stroke 05/30/2017  . Hyperlipidemia 05/30/2017  . Migraine without aura and without status migrainosus, not intractable 05/27/2017  . Pain in right knee 01/21/2017  . Psychogenic nonepileptic seizure   . Chronic pain syndrome   . Restless leg syndrome   . Chronic prescription benzodiazepine use 01/20/2016  . Leukocytosis 01/20/2016  . Seizure-like activity ([place])   . GAD (generalized anxiety disorder) 02/14/2015  . Neck pain 02/14/2015  . History of hysterectomy 02/14/2015  . Grieving 12/16/2014  . Iron deficiency anemia due to chronic blood loss 08/29/2014  . Insomnia, persistent 08/14/2014  . Major depression ([place]) 08/14/2014  .  Temporomandibular joint sounds on opening and/or closing the jaw 08/14/2014  . Degenerative disc disease, lumbar 08/14/2014  . Bleeding internal hemorrhoids 08/14/2014  . Gastric reflux 08/14/2014  . Blood glucose elevated 08/14/2014  . Irritable bowel syndrome with constipation 08/14/2014  . Hypertriglyceridemia 08/14/2014  . Overweight 08/14/2014  . Tinnitus 08/14/2014  . Vitamin D deficiency 08/14/2014  . Tachycardia 11/25/2012  . DOE (dyspnea on exertion) 11/06/2012  . Previous cesarean delivery, delivered, with or without mention of antepartum condition 01/19/2012    Past Surgical History:  Procedure Laterality Date  . ABDOMINOPLASTY  Feb. 2015  . CESAREAN SECTION  2011  . COLONOSCOPY WITH PROPOFOL N/A 11/26/2016   Procedure: COLONOSCOPY WITH PROPOFOL;  Surgeon: Jonathon Bellows, MD;  Location: [facility] ENDOSCOPY;  Service: Gastroenterology;  Laterality: N/A;  . CYSTOSCOPY  02/02/2015   Procedure: CYSTOSCOPY;  Surgeon: Gae Dry, MD;  Location: [facility] ORS;  Service: Gynecology;;  . Brigitte Pulse AND CURETTAGE OF UTERUS  2003, 2005, 2008  . ESOPHAGOGASTRODUODENOSCOPY (EGD) WITH PROPOFOL N/A 11/26/2016   Procedure: ESOPHAGOGASTRODUODENOSCOPY (EGD) WITH PROPOFOL;  Surgeon: Jonathon Bellows, MD;  Location: [facility] ENDOSCOPY;  Service: Gastroenterology;  Laterality: N/A;  . HERNIA REPAIR  [id number]   Umbilical  . KNEE ARTHROSCOPY Right 2006  . LAPAROSCOPIC BILATERAL SALPINGECTOMY Bilateral 02/02/2015   Procedure: LAPAROSCOPIC BILATERAL SALPINGECTOMY;  Surgeon: Gae Dry, MD;  Location: [facility] ORS;  Service: Gynecology;  Laterality: Bilateral;  . LAPAROSCOPIC HYSTERECTOMY N/A 02/02/2015  Procedure: HYSTERECTOMY TOTAL LAPAROSCOPIC;  Surgeon: Gae Dry, MD;  Location: [facility] ORS;  Service: Gynecology;  Laterality: N/A;  . LOOP RECORDER INSERTION N/A 12/20/2016   Procedure: LOOP RECORDER INSERTION;  Surgeon: Constance Haw, MD;  Location: [place] CV LAB;  Service: Cardiovascular;  Laterality:  N/A;  . REDUCTION MAMMAPLASTY Bilateral December 2015  . TEE WITHOUT CARDIOVERSION N/A 12/20/2016   Procedure: TRANSESOPHAGEAL ECHOCARDIOGRAM (TEE);  Surgeon: Achaia Spark, MD;  Location: [facility] ENDOSCOPY;  Service: Cardiovascular;  Laterality: N/A;    Family History  Problem Relation Age of Onset  . Hypertension Mother   . Hyperlipidemia Mother   . Heart Problems Father        hole in heart and lower ventricles reversed  . Prostate cancer Maternal Grandfather   . Von Willebrand disease Maternal Uncle     Social History   Socioeconomic History  . Marital status: Married    Spouse name: [name]   . Number of children: 4  . Years of education: Not on file  . Highest education level: Not on file  Occupational History  . Occupation: Aeronautical engineer: [employer]  . Financial resource strain: Somewhat hard  . Food insecurity:    Worry: Never true    Inability: Never true  . Transportation needs:    Medical: No    Non-medical: No  Tobacco Use  . Smoking status: Never Smoker  . Smokeless tobacco: Never Used  Substance and Sexual Activity  . Alcohol use: No    Alcohol/week: 0.0 standard drinks  . Drug use: No  . Sexual activity: Yes    Partners: Male  Lifestyle  . Physical activity:    Days per week: 5 days    Minutes per session: 30 min  . Stress: Not at all  Relationships  . Social connections:    Talks on phone: More than three times a week    Gets together: More than three times a week    Attends religious service: Never    Active member of club or organization: No    Attends meetings of clubs or organizations: Never    Relationship status: Married  . Intimate partner violence:    Fear of current or ex partner: No    Emotionally abused: No    Physically abused: No    Forced sexual activity: No  Other Topics Concern  . Not on file  Social History Narrative   First husband died suddenly in 04/23/14   Remarried 06/07/2016     Current  Outpatient Medications:  .  ALPRAZOLAM XR 1 MG 24 hr tablet, Take 1 tablet (1 mg total) by mouth daily., Disp: 30 tablet, Rfl: 2 .  aspirin EC 81 MG tablet, Take 1 tablet (81 mg total) by mouth daily., Disp: 1 tablet, Rfl: 1 .  carvedilol (COREG) 3.125 MG tablet, Take 1 tablet (3.125 mg total) by mouth 2 (two) times daily with a meal., Disp: 180 tablet, Rfl: 1 .  Coenzyme Q10 (COQ-10) 200 MG CAPS, Take 1 capsule by mouth daily., Disp: , Rfl:  .  cyclobenzaprine (FLEXERIL) 10 MG tablet, Take 1 tablet (10 mg total) by mouth daily as needed for muscle spasms., Disp: 90 tablet, Rfl: 0 .  Icosapent Ethyl (VASCEPA) 1 g CAPS, Take 2 g 2 (two) times daily by mouth., Disp: 120 capsule, Rfl: 2 .  pantoprazole (PROTONIX) 40 MG tablet, Take 1 tablet (40 mg total) by mouth daily., Disp: 90 tablet,  Rfl: 1 .  PARoxetine (PAXIL) 40 MG tablet, Take 1 tablet (40 mg total) by mouth every morning., Disp: 90 tablet, Rfl: 1 .  rosuvastatin (CRESTOR) 5 MG tablet, Take 0.5 tablets (2.5 mg total) by mouth daily., Disp: 30 tablet, Rfl: 3 .  Vitamin D, Ergocalciferol, (DRISDOL) 50000 units CAPS capsule, Take 1 capsule (50,000 Units total) by mouth every 7 (seven) days., Disp: 12 capsule, Rfl: 0 .  zolpidem (AMBIEN) 10 MG tablet, Take 1 tablet (10 mg total) by mouth at bedtime as needed for sleep., Disp: 90 tablet, Rfl: 0 .  clopidogrel (PLAVIX) 75 MG tablet, Take 1 tablet (75 mg total) by mouth daily. (Patient not taking: Reported on 10/07/2017), Disp: 30 tablet, Rfl: 0  Allergies  Allergen Reactions  . Azithromycin Diarrhea  . Penicillins Hives and Swelling    Has patient had a PCN reaction causing immediate rash, facial/tongue/throat swelling, SOB or lightheadedness with hypotension: Yes Has patient had a PCN reaction causing severe rash involving mucus membranes or skin necrosis: No Has patient had a PCN reaction that required hospitalization No Has patient had a PCN reaction occurring within the last 10 years: No If  all of the above answers are "NO", then may proceed with Cephalosporin use.     ROS  Ten systems reviewed and is negative except as mentioned in HPI She has noticed right upper quadrant and pain radiating to flank again, advised to follow up with GI as previously discussed   Objective  Vitals:   10/07/17 1200  BP: 96/64  Pulse: 97  Resp: 16  Temp: 98.2 F (36.8 C)  TempSrc: Oral  SpO2: 96%  Weight: 172 lb 1.6 oz (78.1 kg)  Height: '5\' 6"'  (1.676 m)    Body mass index is 27.78 kg/m.  Physical Exam  Constitutional: Patient appears well-developed and well-nourished. Overweight.  No distress.  HEENT: head atraumatic, normocephalic, pupils equal and reactive to light,  neck supple, throat within normal limits Cardiovascular: Normal rate, regular rhythm and normal heart sounds.  No murmur heard. No BLE edema. Pulmonary/Chest: Effort normal and breath sounds normal. No respiratory distress. Abdominal: Soft.  There is mild right upper quadrant  tenderness. Psychiatric: Patient has a normal mood and affect. behavior is normal. Judgment and thought content normal. Neurological : mild dysarthria. No weakness today   Recent Results (from the past 2160 hour(s))  CUP PACEART REMOTE DEVICE CHECK     Status: None   Collection Time: 07/30/17  8:36 PM  Result Value Ref Range   Date Time Interrogation Session [id number]    Pulse Generator Manufacturer MERM    Pulse Gen Model ERD40 Reveal LINQ    Pulse Gen Serial Number [id number]    Clinic Name [facility]    Implantable Pulse Generator Type ICM/ILR    Implantable Pulse Generator Implant Date [id number]    Eval Rhythm SR   CBC with Differential/Platelet     Status: None   Collection Time: 09/12/17  8:38 AM  Result Value Ref Range   WBC 8.5 3.4 - 10.8 x10E3/uL   RBC 4.20 3.77 - 5.28 x10E6/uL   Hemoglobin 13.0 11.1 - 15.9 g/dL   Hematocrit 39.4 34.0 - 46.6 %   MCV 94 79 - 97 fL   MCH 31.0 26.6 - 33.0 pg   MCHC 33.0 31.5 -  35.7 g/dL   RDW 13.7 12.3 - 15.4 %   Platelets 224 150 - 450 x10E3/uL   Neutrophils 78 Not Estab. %   Lymphs 18  Not Estab. %   Monocytes 3 Not Estab. %   Eos 1 Not Estab. %   Basos 0 Not Estab. %   Neutrophils Absolute 6.6 1.4 - 7.0 x10E3/uL   Lymphocytes Absolute 1.6 0.7 - 3.1 x10E3/uL   Monocytes Absolute 0.3 0.1 - 0.9 x10E3/uL   EOS (ABSOLUTE) 0.1 0.0 - 0.4 x10E3/uL   Basophils Absolute 0.0 0.0 - 0.2 x10E3/uL   Immature Granulocytes 0 Not Estab. %   Immature Grans (Abs) 0.0 0.0 - 0.1 x10E3/uL  Lipid panel     Status: Abnormal   Collection Time: 09/12/17  8:38 AM  Result Value Ref Range   Cholesterol, Total 161 100 - 199 mg/dL   Triglycerides 245 (H) 0 - 149 mg/dL   HDL 43 >39 mg/dL   VLDL Cholesterol Cal 49 (H) 5 - 40 mg/dL   LDL Calculated 69 0 - 99 mg/dL   Chol/HDL Ratio 3.7 0.0 - 4.4 ratio    Comment:                                   T. Chol/HDL Ratio                                             Men  Women                               1/2 Avg.Risk  3.4    3.3                                   Avg.Risk  5.0    4.4                                2X Avg.Risk  9.6    7.1                                3X Avg.Risk 23.4   11.0   Hemoglobin A1c     Status: None   Collection Time: 09/12/17  8:38 AM  Result Value Ref Range   Hgb A1c MFr Bld 5.2 4.8 - 5.6 %    Comment:          Prediabetes: 5.7 - 6.4          Diabetes: >6.4          Glycemic control for adults with diabetes: <7.0    Est. average glucose Bld gHb Est-mCnc 103 mg/dL  Comprehensive metabolic panel     Status: Abnormal   Collection Time: 09/12/17  8:38 AM  Result Value Ref Range   Glucose 81 65 - 99 mg/dL   BUN 18 6 - 24 mg/dL   Creatinine, Ser 1.12 (H) 0.57 - 1.00 mg/dL   GFR calc non Af Amer 59 (L) >59 mL/min/1.73   GFR calc Af Amer 68 >59 mL/min/1.73   BUN/Creatinine Ratio 16 9 - 23   Sodium 141 134 - 144 mmol/L   Potassium 4.3 3.5 - 5.2 mmol/L   Chloride 104 96 - 106 mmol/L   CO2 23 20 -  29 mmol/L    Calcium 9.0 8.7 - 10.2 mg/dL   Total Protein 6.8 6.0 - 8.5 g/dL   Albumin 4.4 3.5 - 5.5 g/dL   Globulin, Total 2.4 1.5 - 4.5 g/dL   Albumin/Globulin Ratio 1.8 1.2 - 2.2   Bilirubin Total 0.4 0.0 - 1.2 mg/dL   Alkaline Phosphatase 69 39 - 117 IU/L   AST 12 0 - 40 IU/L   ALT 13 0 - 32 IU/L  TSH     Status: None   Collection Time: 09/12/17  8:38 AM  Result Value Ref Range   TSH 1.970 0.450 - 4.500 uIU/mL  Vitamin B12     Status: None   Collection Time: 09/12/17  8:38 AM  Result Value Ref Range   Vitamin B-12 368 232 - 1,245 pg/mL  VITAMIN D 25 Hydroxy (Vit-D Deficiency, Fractures)     Status: Abnormal   Collection Time: 09/12/17  8:38 AM  Result Value Ref Range   Vit D, 25-Hydroxy 17.2 (L) 30.0 - 100.0 ng/mL    Comment: Vitamin D deficiency has been defined by the [place] practice guideline as a level of serum 25-OH vitamin D less than 20 ng/mL (1,2). The Endocrine Society went on to further define vitamin D insufficiency as a level between 21 and 29 ng/mL (2). 1. IOM (Institute of Medicine). 2010. Dietary reference    intakes for calcium and D. [place]: The    [employer]. 2. Holick MF, Binkley [place], Bischoff-Ferrari HA, et al.    Evaluation, treatment, and prevention of vitamin D    deficiency: an Endocrine Society clinical practice    guideline. JCEM. 2011 Jul; 96(7):1911-30.   Lipase     Status: None   Collection Time: 09/12/17  8:38 AM  Result Value Ref Range   Lipase 24 14 - 72 U/L  Protein C, total     Status: None   Collection Time: 09/12/17  8:43 AM  Result Value Ref Range   Protein C Antigen 88 60 - 150 %  Protein S, total     Status: None   Collection Time: 09/12/17  8:43 AM  Result Value Ref Range   Protein S Ag, Total 80 60 - 150 %    Comment: This test was developed and its performance characteristics determined by [facility]. It has not been cleared or approved by the Food and Drug Administration.    Factor 5 leiden     Status: None   Collection Time: 09/12/17  8:43 AM  Result Value Ref Range   Factor V Leiden Comment     Comment: Result:  Negative (no mutation found) Factor V Leiden is a specific mutation ([id number]) in the factor V gene that is associated with an increased risk of venous thrombosis. Factor V Leiden is more resistant to inactivation by activated protein C.  As a result, factor V persists in the circulation leading to a mild hyper- coagulable state.  The Leiden mutation accounts for 90% - 95% of APC resistance.  Factor V Leiden has been reported in patients with deep vein thrombosis, pulmonary embolus, central retinal vein occlusion, cerebral sinus thrombosis and hepatic vein thrombosis. Other risk factors to be considered in the workup for venous thrombosis include the [id number] mutation in the factor II (prothrombin) gene, protein S and C deficiency, and antithrombin deficiencies. Anticardiolipin antibody and lupus anticoagulant analysis may be appropriate for certain patients, as well as homocysteine levels. Contact your local [facility]  for information on how to order additional tes ting if desired. **Genetic counselors are available for health care providers to**   discuss results at [phone]). Methodology: DNA analysis of the Factor V gene was performed by allele-specific PCR. The diagnostic sensitivity and specificity is >99% for both. Molecular-based testing is highly accurate, but as in any laboratory test, diagnostic errors may occur. All test results must be combined with clinical information for the most accurate interpretation. This test was developed and its performance characteristics determined by [facility]. It has not been cleared or approved by the Food and Drug Administration. References: Voelkerding K (1996).  Clin Lab Med [phone]. Allison Quarry, PhD, [facility] Ruben Reason, PhD, [facility] Annetta Maw, M.S., PhD, [facility] Alfredo Bach, PhD, [facility] Norva Riffle, PhD, [facility] Earlean Polka PhD, [facility]   Glucose, capillary     Status: Abnormal   Collection Time: 09/27/17 11:15 PM  Result Value Ref Range   Glucose-Capillary 105 (H) 70 - 99 mg/dL  Ethanol     Status: Abnormal   Collection Time: 09/27/17 11:19 PM  Result Value Ref Range   Alcohol, Ethyl (B) 13 (H) <10 mg/dL    Comment: (NOTE) Lowest detectable limit for serum alcohol is 10 mg/dL. For medical purposes only. Performed at [facility], [address] [place], [place] [address]   Protime-INR     Status: None   Collection Time: 09/27/17 11:19 PM  Result Value Ref Range   Prothrombin Time 13.0 11.4 - 15.2 seconds   INR 0.99     Comment: Performed at [facility], [address] [place], [place] [address]  APTT     Status: None   Collection Time: 09/27/17 11:19 PM  Result Value Ref Range   aPTT 29 24 - 36 seconds    Comment: Performed at [facility], [address] [place], [place] [address]  CBC     Status: Abnormal   Collection Time: 09/27/17 11:19 PM  Result Value Ref Range   WBC 12.0 (H) 3.6 - 11.0 K/uL   RBC 4.31 3.80 - 5.20 MIL/uL   Hemoglobin 13.7 12.0 - 16.0 g/dL   HCT 39.2 35.0 - 47.0 %   MCV 90.9 80.0 - 100.0 fL   MCH 31.8 26.0 - 34.0 pg   MCHC 34.9 32.0 - 36.0 g/dL   RDW 13.4 11.5 - 14.5 %   Platelets 299 150 - 440 K/uL    Comment: Performed at [facility], [address] [place], [place] [address]  Differential     Status: Abnormal   Collection Time: 09/27/17 11:19 PM  Result Value Ref Range   Neutrophils Relative % 59 %   Neutro Abs 7.0 (H) 1.4 - 6.5 K/uL   Lymphocytes Relative 35 %   Lymphs Abs 4.2 (H) 1.0 - 3.6 K/uL   Monocytes Relative 5 %   Monocytes Absolute 0.6 0.2 - 0.9 K/uL   Eosinophils Relative 1 %   Eosinophils Absolute 0.1 0 - 0.7 K/uL   Basophils Relative 0 %   Basophils Absolute 0.1 0 - 0.1 K/uL    Comment: Performed at [facility], [address] [place], [place] [address]  Comprehensive metabolic panel     Status: Abnormal   Collection Time: 09/27/17 11:19 PM  Result Value Ref Range   Sodium 140 135 - 145 mmol/L   Potassium 3.9 3.5 - 5.1 mmol/L   Chloride 109 98 - 111 mmol/L   CO2 21 (L)  22 - 32 mmol/L   Glucose, Bld 109 (H) 70 - 99 mg/dL   BUN 20 6 - 20 mg/dL   Creatinine, Ser 1.12 (H) 0.44 - 1.00 mg/dL   Calcium 9.1 8.9 - 10.3 mg/dL   Total Protein 7.2 6.5 - 8.1 g/dL   Albumin 4.4 3.5 - 5.0 g/dL   AST 24 15 - 41 U/L   ALT 15 0 - 44 U/L   Alkaline Phosphatase 62 38 - 126 U/L   Total Bilirubin 0.6 0.3 - 1.2 mg/dL   GFR calc non Af Amer 58 (L) >60 mL/min   GFR calc Af Amer >60 >60 mL/min    Comment: (NOTE) The eGFR has been calculated using the CKD EPI equation. This calculation has not been validated in all clinical situations. eGFR's persistently <60 mL/min signify possible Chronic Kidney Disease.    Anion gap 10 5 - 15    Comment: Performed at [facility], [address] [place], [place] [address]  Troponin I     Status: None   Collection Time: 09/27/17 11:19 PM  Result Value Ref Range   Troponin I <0.03 <0.03 ng/mL    Comment: Performed at [facility], [address] [place], [place] [address]  Urine Drug Screen, Qualitative     Status: Abnormal   Collection Time: 09/27/17 11:27 PM  Result Value Ref Range   Tricyclic, Ur Screen POSITIVE (A) NONE DETECTED   Amphetamines, Ur Screen NONE DETECTED NONE DETECTED   MDMA (Ecstasy)Ur Screen NONE DETECTED NONE DETECTED   Cocaine Metabolite,Ur [place] NONE DETECTED NONE DETECTED   Opiate, Ur Screen NONE DETECTED NONE DETECTED   Phencyclidine (PCP) Ur S NONE DETECTED NONE DETECTED   Cannabinoid 50 Ng, Ur  NONE DETECTED NONE DETECTED   Barbiturates, Ur Screen NONE DETECTED NONE DETECTED   Benzodiazepine, Ur Scrn TEST NOT PERFORMED, REAGENT NOT AVAILABLE (A) NONE DETECTED   Methadone Scn, Ur NONE DETECTED NONE DETECTED    Comment: (NOTE) Tricyclics +  metabolites, urine    Cutoff 1000 ng/mL Amphetamines + metabolites, urine  Cutoff 1000 ng/mL MDMA (Ecstasy), urine              Cutoff 500 ng/mL Cocaine Metabolite, urine          Cutoff 300 ng/mL Opiate + metabolites, urine        Cutoff 300 ng/mL Phencyclidine (PCP), urine         Cutoff 25 ng/mL Cannabinoid, urine                 Cutoff 50 ng/mL Barbiturates + metabolites, urine  Cutoff 200 ng/mL Benzodiazepine, urine              Cutoff 200 ng/mL Methadone, urine                   Cutoff 300 ng/mL The urine drug screen provides only a preliminary, unconfirmed analytical test result and should not be used for non-medical purposes. Clinical consideration and professional judgment should be applied to any positive drug screen result due to possible interfering substances. A more specific alternate chemical method must be used in order to obtain a confirmed analytical result. Gas chromatography / mass spectrometry (GC/MS) is the preferred confirmat ory method. Performed at [facility], [address] [place], [place] [address]   Urinalysis, Routine w reflex microscopic     Status: Abnormal   Collection Time: 09/27/17 11:27 PM  Result Value Ref Range   Color, Urine  YELLOW (A) YELLOW   APPearance CLEAR (A) CLEAR   Specific Gravity, Urine 1.019 1.005 - 1.030   pH 5.0 5.0 - 8.0   Glucose, UA NEGATIVE NEGATIVE mg/dL   Hgb urine dipstick SMALL (A) NEGATIVE   Bilirubin Urine NEGATIVE NEGATIVE   Ketones, ur NEGATIVE NEGATIVE mg/dL   Protein, ur NEGATIVE NEGATIVE mg/dL   Nitrite NEGATIVE NEGATIVE   Leukocytes, UA NEGATIVE NEGATIVE   RBC / HPF 6-10 0 - 5 RBC/hpf   WBC, UA 0-5 0 - 5 WBC/hpf   Bacteria, UA NONE SEEN NONE SEEN   Squamous Epithelial / LPF 0-5 0 - 5   Mucus PRESENT     Comment: Performed at [facility], [address] [place], [place] [address]  Blood gas, arterial     Status: Abnormal   Collection Time: 09/28/17  1:42 AM  Result Value Ref Range    FIO2 0.40    Delivery systems VENTILATOR    Mode ASSIST CONTROL    VT 450 mL   LHR 16 resp/min   Peep/cpap 5.0 cm H20   pH, Arterial 7.42 7.350 - 7.450   pCO2 arterial 30 (L) 32.0 - 48.0 mmHg   pO2, Arterial 197 (H) 83.0 - 108.0 mmHg   Bicarbonate 19.5 (L) 20.0 - 28.0 mmol/L   Acid-base deficit 4.0 (H) 0.0 - 2.0 mmol/L   O2 Saturation 99.7 %   Patient temperature 37.0    Collection site RIGHT BRACHIAL    Sample type ARTERIAL DRAW    Allens test (pass/fail) PASS PASS    Comment: Performed at [facility], [address] [place], [place] [address]     PHQ2/9: Depression screen [facility] 2/9 10/07/2017 09/03/2017 08/12/2017 05/27/2017 10/10/2016  Decreased Interest 1 0 0 0 0  Down, Depressed, Hopeless '3 1 1 ' 0 0  PHQ - 2 Score '4 1 1 ' 0 0  Altered sleeping 3 0 0 - -  Tired, decreased energy 3 0 0 - -  Change in appetite 3 0 0 - -  Feeling bad or failure about yourself  '1 1 1 ' - -  Trouble concentrating 3 0 0 - -  Moving slowly or fidgety/restless 0 0 0 - -  Suicidal thoughts 0 0 0 - -  PHQ-9 Score '17 2 2 ' - -  Difficult doing work/chores Extremely dIfficult - Not difficult at all - -   She states it is because she was just hospitalized   Fall Risk: Fall Risk  10/07/2017 09/03/2017 08/12/2017 08/12/2017 05/30/2017  Falls in the past year? Yes Yes Yes No Yes  Number falls in past yr: 2 or more 2 or more 1 - 1  Injury with Fall? Yes Yes No - No  Comment - - - - -  Risk for fall due to : - History of fall(s) - - Impaired balance/gait  Follow up - - Falls prevention discussed - -      Functional Status Survey: Is the patient deaf or have difficulty hearing?: No Does the patient have difficulty seeing, even when wearing glasses/contacts?: Yes Does the patient have difficulty concentrating, remembering, or making decisions?: Yes Does the patient have difficulty walking or climbing stairs?: Yes Does the patient have difficulty dressing or bathing?: Yes Does the patient have  difficulty doing errands alone such as visiting a doctor's office or shopping?: Yes    Assessment & Plan  1. Hospital discharge follow-up  She states she is getting better every day.   2. Seizure-like activity (  [place])  Seems to be secondary to stress, she was off paxil - all medications during EEG and is getting back on her system now   3. Stress and adjustment reaction  Causing seizure like symptoms. Explained the importance of referral to psychiatrist and therapist, but she states she feels more comfortable talking to her husband. She states that when she feels like symptoms are going to start, she is going to a quiet room and resting.   4. Decreased GFR  recpeat comp panel   5. Anemia, unspecified type  - CBC with Differential/Platelet - Comprehensive metabolic panel

## 2017-10-07 NOTE — Progress Notes (Signed)
[facility] Summary Report / Loop Recorder 

## 2017-10-17 DIAGNOSIS — G4733 Obstructive sleep apnea (adult) (pediatric): Secondary | ICD-10-CM | POA: Diagnosis not present

## 2017-10-20 LAB — CUP PACEART REMOTE DEVICE CHECK
Date Time Interrogation Session: 20190715203634
MDC IDC PG IMPLANT DT: 20181103

## 2017-10-21 ENCOUNTER — Other Ambulatory Visit: Payer: Self-pay | Admitting: Family Medicine

## 2017-10-21 DIAGNOSIS — F5104 Psychophysiologic insomnia: Secondary | ICD-10-CM

## 2017-10-21 NOTE — Telephone Encounter (Signed)
Refill request for general medication. Ambien to [facility]  Last office visit 10/07/2017  Follow up on 11/06/2017

## 2017-10-29 ENCOUNTER — Other Ambulatory Visit: Payer: Self-pay | Admitting: Family Medicine

## 2017-10-29 DIAGNOSIS — I639 Cerebral infarction, unspecified: Secondary | ICD-10-CM

## 2017-10-29 NOTE — Telephone Encounter (Signed)
Please contact patient, she told me it had been discontinued and pharmacy is requesting refill

## 2017-11-01 NOTE — ED Notes (Signed)
Pt given 160 mg succ by MD Rifenbark but drawn up by this RN. This RN then administrated Versed drip post intubation.

## 2017-11-04 ENCOUNTER — Encounter: Payer: BLUE CROSS/BLUE SHIELD | Admitting: Psychology

## 2017-11-04 ENCOUNTER — Encounter

## 2017-11-06 ENCOUNTER — Ambulatory Visit (INDEPENDENT_AMBULATORY_CARE_PROVIDER_SITE_OTHER): Payer: BLUE CROSS/BLUE SHIELD | Admitting: *Deleted

## 2017-11-06 ENCOUNTER — Encounter: Payer: Self-pay | Admitting: Family Medicine

## 2017-11-06 ENCOUNTER — Ambulatory Visit: Payer: BLUE CROSS/BLUE SHIELD | Admitting: Family Medicine

## 2017-11-06 VITALS — BP 100/66 | HR 102 | Temp 98.2°F | Resp 14 | Ht 66.0 in | Wt 180.3 lb

## 2017-11-06 DIAGNOSIS — F411 Generalized anxiety disorder: Secondary | ICD-10-CM | POA: Diagnosis not present

## 2017-11-06 DIAGNOSIS — E785 Hyperlipidemia, unspecified: Secondary | ICD-10-CM | POA: Diagnosis not present

## 2017-11-06 DIAGNOSIS — K219 Gastro-esophageal reflux disease without esophagitis: Secondary | ICD-10-CM

## 2017-11-06 DIAGNOSIS — G4733 Obstructive sleep apnea (adult) (pediatric): Secondary | ICD-10-CM

## 2017-11-06 DIAGNOSIS — E538 Deficiency of other specified B group vitamins: Secondary | ICD-10-CM

## 2017-11-06 DIAGNOSIS — R944 Abnormal results of kidney function studies: Secondary | ICD-10-CM

## 2017-11-06 DIAGNOSIS — R569 Unspecified convulsions: Secondary | ICD-10-CM

## 2017-11-06 DIAGNOSIS — F5104 Psychophysiologic insomnia: Secondary | ICD-10-CM

## 2017-11-06 DIAGNOSIS — R000 Tachycardia, unspecified: Secondary | ICD-10-CM

## 2017-11-06 DIAGNOSIS — R7303 Prediabetes: Secondary | ICD-10-CM

## 2017-11-06 DIAGNOSIS — I639 Cerebral infarction, unspecified: Secondary | ICD-10-CM

## 2017-11-06 DIAGNOSIS — E781 Pure hyperglyceridemia: Secondary | ICD-10-CM

## 2017-11-06 MED ORDER — ROSUVASTATIN CALCIUM 20 MG PO TABS
20.0000 mg | ORAL_TABLET | Freq: Every day | ORAL | 0 refills | Status: DC
Start: 2017-11-06 — End: 2018-05-11

## 2017-11-06 MED ORDER — CARVEDILOL 3.125 MG PO TABS: 3.1250 mg | ORAL_TABLET | Freq: Two times a day (BID) | ORAL | 1 refills | Status: DC

## 2017-11-06 MED ORDER — VITAMIN D (ERGOCALCIFEROL) 1.25 MG (50000 UNIT) PO CAPS: 50000.0000 [IU] | ORAL_CAPSULE | ORAL | 0 refills | Status: DC

## 2017-11-06 MED ORDER — ASPIRIN 325 MG PO TBEC: 325.0000 mg | DELAYED_RELEASE_TABLET | Freq: Every day | ORAL | 0 refills | Status: DC

## 2017-11-06 MED ORDER — ALPRAZOLAM XR 1 MG PO TB24: 1.0000 mg | ORAL_TABLET | Freq: Every day | ORAL | 2 refills | Status: DC

## 2017-11-06 MED ORDER — CYANOCOBALAMIN 1000 MCG/ML IJ SOLN
1000.0000 ug | Freq: Once | INTRAMUSCULAR | Status: AC
  Administered 2017-11-06: 1000 ug via INTRAMUSCULAR

## 2017-11-06 MED ORDER — ICOSAPENT ETHYL 1 G PO CAPS: 1.0000 g | ORAL_CAPSULE | Freq: Two times a day (BID) | ORAL | 1 refills | Status: DC

## 2017-11-06 NOTE — Progress Notes (Signed)
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 Score 0 _1 0  Altered sleeping 1 3 0 0 -  Tired, decreased energy 1 3 0 0 -  Change in appetite 1 3 0 0 -  Feeling bad or failure about yourself  _2 -  Trouble concentrating 0 3 0 0 -  Moving slowly or fidgety/restless 0 0 0 0 -  Suicidal thoughts 0 0 0 0 -  PHQ-9 Score _3 -  Difficult doing work/chores Somewhat difficult Extremely dIfficult - Not difficult at all -    Fall Risk: Fall Risk  10/07/2017 09/03/2017 08/12/2017 08/12/2017 05/30/2017  Falls in the past year? Yes Yes Yes No Yes  Number falls in past yr: 2 or more 2 or more 1 - 1  Injury with Fall? Yes Yes No - No   Comment - - - - -  Risk for fall due to : - History of fall(s) - - Impaired balance/gait  Follow up - - Falls prevention discussed - -     Functional Status Survey: Is the patient deaf or have difficulty hearing?: No Does the patient have difficulty seeing, even when wearing glasses/contacts?: Yes Does the patient have difficulty concentrating, remembering, or making decisions?: Yes Does the patient have difficulty walking or climbing stairs?: Yes Does the patient have difficulty dressing or bathing?: Yes Does the patient have difficulty doing errands alone such as visiting a doctor's office or shopping?: Yes   Assessment & Plan  1. GAD (generalized anxiety disorder)  - ALPRAZOLAM XR 1 MG 24 hr tablet; Take 1 tablet (1 mg total) by mouth daily.  Dispense: 30 tablet; Refill: 2  2. Tachycardia  - carvedilol (COREG) 3.125 MG tablet; Take 1 tablet (3.125 mg total) by mouth 2 (two) times daily with a meal.  Dispense: 180 tablet; Refill: 1  3. Gastric reflux  Continue PPI, she has been unable to wean self off   4. Dyslipidemia  - Icosapent Ethyl (VASCEPA) 1 g CAPS; Take 1 capsule (1 g total) by mouth 2 (two) times daily.  Dispense: 180 capsule; Refill: 1 5. Chronic insomnia  She still has ambien at home   6. Seizure-like activity ([place])  3 hospital stays since first stroke in October 2018  7. B12 deficiency  Last level at [facility] was 237, very tired, we will start injection today and discussed either coming back for B12 monthly or SL B12 - cyanocobalamin ((VITAMIN B-12)) injection 1,000 mcg  8. Decreased GFR  Needs to get labs done  9. Hypertriglyceridemia  She will try resuming Vascepa but can only tolerate 1 g twice daily because makes stools oily   10. Pre-diabetes  Doing well   11. OSA (obstructive sleep apnea)  Continue CPAP every night

## 2017-11-07 NOTE — Progress Notes (Signed)
[facility] Summary Report / Loop Recorder 

## 2017-11-10 ENCOUNTER — Telehealth: Payer: Self-pay | Admitting: Family Medicine

## 2017-11-10 ENCOUNTER — Other Ambulatory Visit: Payer: Self-pay | Admitting: Family Medicine

## 2017-11-10 DIAGNOSIS — F411 Generalized anxiety disorder: Secondary | ICD-10-CM

## 2017-11-10 LAB — CUP PACEART REMOTE DEVICE CHECK
Implantable Pulse Generator Implant Date: 20181103
MDC IDC SESS DTM: 20190817214002

## 2017-11-10 NOTE — Telephone Encounter (Signed)
Copied from CRM [id number]. Topic: Quick Communication - See Telephone Encounter >> Nov 10, 2017  1:43 PM Windy KalataMichael, Evania Lyne L, NT wrote: CRM for notification. See Telephone encounter for: 11/10/17.  [facility] is calling and states the patient is wanting to get ALPRAZOLAM XR 1 MG 24 hr tablet sent to them instead of [facility]. [facility] 11/06/17. Please advise.  [address] [place] - [address] [place] [id number] Phone: [phone] Fax: [phone]

## 2017-11-11 ENCOUNTER — Other Ambulatory Visit: Payer: Self-pay | Admitting: Family Medicine

## 2017-11-11 ENCOUNTER — Encounter: Payer: Self-pay | Admitting: Family Medicine

## 2017-11-11 NOTE — Telephone Encounter (Signed)
Called [facility] and cancel initial prescription of Alprazolam XR 1 mg and [facility] per Dr. Carlynn PurlSowles request.

## 2017-11-17 DIAGNOSIS — G4733 Obstructive sleep apnea (adult) (pediatric): Secondary | ICD-10-CM | POA: Diagnosis not present

## 2017-11-17 LAB — CUP PACEART REMOTE DEVICE CHECK
Date Time Interrogation Session: 20190919221014
MDC IDC PG IMPLANT DT: 20181103

## 2017-12-08 ENCOUNTER — Other Ambulatory Visit: Payer: Self-pay | Admitting: Family Medicine

## 2017-12-08 DIAGNOSIS — F5104 Psychophysiologic insomnia: Secondary | ICD-10-CM

## 2017-12-08 MED ORDER — ZOLPIDEM TARTRATE 10 MG PO TABS: 10.0000 mg | ORAL_TABLET | Freq: Every evening | ORAL | 0 refills | Status: DC | PRN

## 2017-12-08 NOTE — Telephone Encounter (Signed)
Refill request for general medication: Ambien 10 mg   Last office visit: 11/06/2017  Last physical exam: None indicated  Follow-ups on file. 02/09/2018

## 2017-12-09 ENCOUNTER — Ambulatory Visit (INDEPENDENT_AMBULATORY_CARE_PROVIDER_SITE_OTHER): Payer: BLUE CROSS/BLUE SHIELD | Admitting: *Deleted

## 2017-12-09 DIAGNOSIS — I639 Cerebral infarction, unspecified: Secondary | ICD-10-CM

## 2017-12-10 NOTE — Progress Notes (Signed)
[facility] Summary Report / Loop Recorder 

## 2017-12-11 ENCOUNTER — Other Ambulatory Visit: Payer: Self-pay | Admitting: Family Medicine

## 2017-12-16 ENCOUNTER — Other Ambulatory Visit: Payer: Self-pay | Admitting: Family Medicine

## 2017-12-16 DIAGNOSIS — M542 Cervicalgia: Principal | ICD-10-CM

## 2017-12-16 DIAGNOSIS — G8929 Other chronic pain: Secondary | ICD-10-CM

## 2018-01-01 LAB — CUP PACEART REMOTE DEVICE CHECK
Date Time Interrogation Session: 20191022221107
Implantable Pulse Generator Implant Date: 20181103

## 2018-01-05 ENCOUNTER — Encounter: Payer: Self-pay | Admitting: Family Medicine

## 2018-01-06 DIAGNOSIS — R05 Cough: Secondary | ICD-10-CM | POA: Diagnosis not present

## 2018-01-06 DIAGNOSIS — J209 Acute bronchitis, unspecified: Secondary | ICD-10-CM | POA: Diagnosis not present

## 2018-01-06 DIAGNOSIS — R0602 Shortness of breath: Secondary | ICD-10-CM | POA: Diagnosis not present

## 2018-01-12 ENCOUNTER — Ambulatory Visit (INDEPENDENT_AMBULATORY_CARE_PROVIDER_SITE_OTHER): Payer: BLUE CROSS/BLUE SHIELD

## 2018-01-12 DIAGNOSIS — I639 Cerebral infarction, unspecified: Secondary | ICD-10-CM

## 2018-01-12 NOTE — Progress Notes (Signed)
[facility] Summary Report / Loop Recorder 

## 2018-02-09 ENCOUNTER — Encounter: Payer: Self-pay | Admitting: Family Medicine

## 2018-02-09 ENCOUNTER — Ambulatory Visit: Payer: BLUE CROSS/BLUE SHIELD | Admitting: Family Medicine

## 2018-02-09 VITALS — BP 122/74 | HR 99 | Temp 98.4°F | Ht 66.0 in | Wt 189.3 lb

## 2018-02-09 DIAGNOSIS — Z1159 Encounter for screening for other viral diseases: Secondary | ICD-10-CM | POA: Diagnosis not present

## 2018-02-09 DIAGNOSIS — E785 Hyperlipidemia, unspecified: Secondary | ICD-10-CM

## 2018-02-09 DIAGNOSIS — G43009 Migraine without aura, not intractable, without status migrainosus: Secondary | ICD-10-CM

## 2018-02-09 DIAGNOSIS — Z862 Personal history of diseases of the blood and blood-forming organs and certain disorders involving the immune mechanism: Secondary | ICD-10-CM

## 2018-02-09 DIAGNOSIS — G8929 Other chronic pain: Secondary | ICD-10-CM

## 2018-02-09 DIAGNOSIS — G4730 Sleep apnea, unspecified: Secondary | ICD-10-CM | POA: Diagnosis not present

## 2018-02-09 DIAGNOSIS — F5104 Psychophysiologic insomnia: Secondary | ICD-10-CM | POA: Diagnosis not present

## 2018-02-09 DIAGNOSIS — E781 Pure hyperglyceridemia: Secondary | ICD-10-CM

## 2018-02-09 DIAGNOSIS — F411 Generalized anxiety disorder: Secondary | ICD-10-CM | POA: Diagnosis not present

## 2018-02-09 DIAGNOSIS — Z79899 Other long term (current) drug therapy: Secondary | ICD-10-CM

## 2018-02-09 DIAGNOSIS — R739 Hyperglycemia, unspecified: Secondary | ICD-10-CM | POA: Diagnosis not present

## 2018-02-09 DIAGNOSIS — M542 Cervicalgia: Secondary | ICD-10-CM | POA: Diagnosis not present

## 2018-02-09 DIAGNOSIS — Z8673 Personal history of transient ischemic attack (TIA), and cerebral infarction without residual deficits: Secondary | ICD-10-CM

## 2018-02-09 DIAGNOSIS — E559 Vitamin D deficiency, unspecified: Secondary | ICD-10-CM

## 2018-02-09 DIAGNOSIS — G894 Chronic pain syndrome: Secondary | ICD-10-CM

## 2018-02-09 DIAGNOSIS — F330 Major depressive disorder, recurrent, mild: Secondary | ICD-10-CM

## 2018-02-09 MED ORDER — ZOLPIDEM TARTRATE 10 MG PO TABS: 10.0000 mg | ORAL_TABLET | Freq: Every evening | ORAL | 0 refills | Status: DC | PRN

## 2018-02-09 MED ORDER — ALPRAZOLAM XR 1 MG PO TB24: 1.0000 mg | ORAL_TABLET | Freq: Every day | ORAL | 2 refills | Status: DC

## 2018-02-09 MED ORDER — CYCLOBENZAPRINE HCL 10 MG PO TABS: ORAL_TABLET | ORAL | 0 refills | Status: DC

## 2018-02-09 NOTE — Patient Instructions (Signed)
To help with reflux: - Elevate your head of bed - either with a few extra pillows, a wedge pillow, or by placing two bed risers under the head of bed posts. - Avoid the following foods: citrus, fatty foods, chocolate, peppermint, and excessive alcohol, along with sodas, orange juice (acidic drinks) - Stop eating at least 3 hours before going to bed, minimize naps/laying down after eating. - No smoking. - If you are overweight or obese, exercising and losing weight will also help your symptoms. - Caution: prolonged use of proton pump inhibitors like omeprazole (Prilosec), pantoprazole (Protonix), esomeprazole (Nexium), and others like Dexilant and Aciphex may increase your risk of pneumonia, Clostridium difficile colitis, osteoporosis, anemia and other health complications  Sleep Hygiene Tips 1) Get regular. One of the best ways to train your body to sleep well is to go to bed and get up at more or less the same time every day, even on weekends and days off! This regular rhythm will make you feel better and will give your body something to work from. 2) Sleep when sleepy. Only try to sleep when you actually feel tired or sleepy, rather than spending too much time awake in bed. 3) Get up & try again. If you haven't been able to get to sleep after about 20 minutes or more, get up and do something calming or boring until you feel sleepy, then return to bed and try again. Sit quietly on the couch with the lights off (bright light will tell your brain that it is time to wake up), or read something boring like the phone book. Avoid doing anything that is too stimulating or interesting, as this will wake you up even more. 4) Avoid caffeine & nicotine. It is best to avoid consuming any caffeine (in coffee, tea, cola drinks, chocolate, and some medications) or nicotine (cigarettes) for at least 4-6 hours before going to bed. These substances act as stimulants and interfere with the ability to fall  asleep 5) Avoid alcohol. It is also best to avoid alcohol for at least 4-6 hours before going to bed. Many people believe that alcohol is relaxing and helps them to get to sleep at first, but it actually interrupts the quality of sleep. 6) Bed is for sleeping. Try not to use your bed for anything other than sleeping and sex, so that your body comes to associate bed with sleep. If you use bed as a place to watch TV, eat, read, work on your laptop, pay bills, and other things, your body will not learn this Connection. 7) No naps. It is best to avoid taking naps during the day, to make sure that you are tired at bedtime. If you can't make it through the day without a nap, make sure it is for less than an hour and before 3pm. 8) Sleep rituals. You can develop your own rituals of things to remind your body that it is time to sleep - some people find it useful to do relaxing stretches or breathing exercises for 15 minutes before bed each night, or sit calmly with a cup of caffeine-free tea. 9) Bathtime. Having a hot bath 1-2 hours before bedtime can be useful, as it will raise your body temperature, causing you to feel sleepy as your body temperature drops again. Research shows that sleepiness is associated with a drop in body temperature. 10) No clock-watching. Many people who struggle with sleep tend to watch the clock too much. Frequently checking the clock during the  night can wake you up (especially if you turn on the light to read the time) and reinforces negative thoughts such as "Oh no, look how late it is, I'll never get to sleep" or "it's so early, I have only slept for 5 hours, this is terrible." 11) Use a sleep diary. This worksheet can be a useful way of making sure you have the right facts about your sleep, rather than making assumptions. Because a diary involves watching the clock (see point 10) it is a good idea to only use it for two weeks to get an idea of what is  going and then perhaps two months down the track to see how you are progressing. 12) Exercise. Regular exercise is a good idea to help with good sleep, but try not to do strenuous exercise in the 4 hours before bedtime. Morning walks are a great way to start the day feeling refreshed! 13) Eat right. A healthy, balanced diet will help you to sleep well, but timing is important. Some people find that a very empty stomach at bedtime is distracting, so it can be useful to have a light snack, but a heavy meal soon before bed can also interrupt sleep. Some people recommend a warm glass of milk, which contains tryptophan, which acts as a natural sleep inducer. 14) The right space. It is very important that your bed and bedroom are quiet and comfortable for sleeping. A cooler room with enough blankets to stay warm is best, and make sure you have curtains or an eyemask to block out early morning light and earplugs if there is noise outside your room. 15) Keep daytime routine the same. Even if you have a bad night sleep and are tired it is important that you try to keep your daytime activities the same as you had planned. That is, don't avoid activities because you feel tired. This can reinforce the insomnia.
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
## 2018-02-10 LAB — COMPLETE METABOLIC PANEL WITH GFR
AG Ratio: 2 (calc) (ref 1.0–2.5)
ALT: 14 U/L (ref 6–29)
AST: 13 U/L (ref 10–35)
Albumin: 4 g/dL (ref 3.6–5.1)
Alkaline phosphatase (APISO): 56 U/L (ref 33–115)
BUN: 18 mg/dL (ref 7–25)
CO2: 29 mmol/L (ref 20–32)
Calcium: 9 mg/dL (ref 8.6–10.2)
Chloride: 105 mmol/L (ref 98–110)
Creat: 0.92 mg/dL (ref 0.50–1.10)
GFR, Est African American: 86 mL/min/{1.73_m2} (ref >=60)
GFR, Est Non African American: 74 mL/min/{1.73_m2} (ref >=60)
Globulin: 2 g/dL (calc) (ref 1.9–3.7)
Glucose, Bld: 106 mg/dL — ABNORMAL HIGH (ref 65–99)
Potassium: 4.2 mmol/L (ref 3.5–5.3)
Sodium: 140 mmol/L (ref 135–146)
Total Bilirubin: 0.4 mg/dL (ref 0.2–1.2)
Total Protein: 6 g/dL — ABNORMAL LOW (ref 6.1–8.1)

## 2018-02-10 LAB — CBC WITH DIFFERENTIAL/PLATELET
ABSOLUTE MONOCYTES: 311 {cells}/uL (ref 200–950)
Basophils Absolute: 28 cells/uL (ref 0–200)
Basophils Relative: 0.4 %
Eosinophils Absolute: 97 cells/uL (ref 15–500)
Eosinophils Relative: 1.4 %
HCT: 37.5 % (ref 35.0–45.0)
Hemoglobin: 13 g/dL (ref 11.7–15.5)
Lymphs Abs: 1891 cells/uL (ref 850–3900)
MCH: 31 pg (ref 27.0–33.0)
MCHC: 34.7 g/dL (ref 32.0–36.0)
MCV: 89.5 fL (ref 80.0–100.0)
MPV: 9.9 fL (ref 7.5–12.5)
Monocytes Relative: 4.5 %
NEUTROS ABS: 4575 {cells}/uL (ref 1500–7800)
Neutrophils Relative %: 66.3 %
Platelets: 246 10*3/uL (ref 140–400)
RBC: 4.19 10*6/uL (ref 3.80–5.10)
RDW: 13 % (ref 11.0–15.0)
Total Lymphocyte: 27.4 %
WBC: 6.9 10*3/uL (ref 3.8–10.8)

## 2018-02-10 LAB — VITAMIN D 25 HYDROXY (VIT D DEFICIENCY, FRACTURES): Vit D, 25-Hydroxy: 32 ng/mL (ref 30–100)

## 2018-02-10 LAB — HEMOGLOBIN A1C
Hgb A1c MFr Bld: 5.5 % of total Hgb (ref <5.7)
Mean Plasma Glucose: 111 (calc)
eAG (mmol/L): 6.2 (calc)

## 2018-02-10 LAB — LIPID PANEL
Cholesterol: 155 mg/dL (ref <200)
HDL: 45 mg/dL — ABNORMAL LOW (ref >50)
LDL Cholesterol (Calc): 83 mg/dL (calc)
Non-HDL Cholesterol (Calc): 110 mg/dL (calc) (ref <130)
Total CHOL/HDL Ratio: 3.4 (calc) (ref <5.0)
Triglycerides: 173 mg/dL — ABNORMAL HIGH (ref <150)

## 2018-02-10 LAB — HEPATITIS C ANTIBODY
Hepatitis C Ab: NONREACTIVE
SIGNAL TO CUT-OFF: 0.01 (ref <1.00)

## 2018-02-13 ENCOUNTER — Ambulatory Visit (INDEPENDENT_AMBULATORY_CARE_PROVIDER_SITE_OTHER): Payer: BLUE CROSS/BLUE SHIELD

## 2018-02-13 DIAGNOSIS — I639 Cerebral infarction, unspecified: Secondary | ICD-10-CM | POA: Diagnosis not present

## 2018-02-15 LAB — CUP PACEART REMOTE DEVICE CHECK
Date Time Interrogation Session: 20191227230911
Implantable Pulse Generator Implant Date: 20181103

## 2018-02-16 NOTE — Progress Notes (Signed)
[facility] Summary Report / Loop Recorder 

## 2018-03-01 LAB — CUP PACEART REMOTE DEVICE CHECK
Date Time Interrogation Session: 20191124224035
Implantable Pulse Generator Implant Date: 20181103

## 2018-03-04 ENCOUNTER — Other Ambulatory Visit: Payer: Self-pay | Admitting: Family Medicine

## 2018-03-13 ENCOUNTER — Other Ambulatory Visit: Payer: Self-pay | Admitting: Family Medicine

## 2018-03-13 DIAGNOSIS — G8929 Other chronic pain: Secondary | ICD-10-CM

## 2018-03-13 DIAGNOSIS — M542 Cervicalgia: Principal | ICD-10-CM

## 2018-03-16 ENCOUNTER — Other Ambulatory Visit: Payer: Self-pay | Admitting: Family Medicine

## 2018-03-16 DIAGNOSIS — K219 Gastro-esophageal reflux disease without esophagitis: Secondary | ICD-10-CM

## 2018-03-16 DIAGNOSIS — F411 Generalized anxiety disorder: Secondary | ICD-10-CM

## 2018-03-18 ENCOUNTER — Ambulatory Visit (INDEPENDENT_AMBULATORY_CARE_PROVIDER_SITE_OTHER): Payer: BLUE CROSS/BLUE SHIELD

## 2018-03-18 DIAGNOSIS — I639 Cerebral infarction, unspecified: Secondary | ICD-10-CM | POA: Diagnosis not present

## 2018-03-19 NOTE — Progress Notes (Signed)
[facility] Summary Report / Loop Recorder 

## 2018-03-20 LAB — CUP PACEART REMOTE DEVICE CHECK
Date Time Interrogation Session: 20200129234000
Implantable Pulse Generator Implant Date: 20181103

## 2018-04-20 ENCOUNTER — Ambulatory Visit (INDEPENDENT_AMBULATORY_CARE_PROVIDER_SITE_OTHER): Payer: BLUE CROSS/BLUE SHIELD | Admitting: *Deleted

## 2018-04-20 DIAGNOSIS — I639 Cerebral infarction, unspecified: Secondary | ICD-10-CM | POA: Diagnosis not present

## 2018-04-21 LAB — CUP PACEART REMOTE DEVICE CHECK
Date Time Interrogation Session: 20200302233556
Implantable Pulse Generator Implant Date: 20181103

## 2018-04-27 NOTE — Progress Notes (Signed)
[facility] Summary Report / Loop Recorder 

## 2018-05-04 ENCOUNTER — Telehealth: Payer: Self-pay | Admitting: Family Medicine

## 2018-05-04 ENCOUNTER — Encounter: Payer: Self-pay | Admitting: Family Medicine

## 2018-05-04 ENCOUNTER — Other Ambulatory Visit: Payer: Self-pay | Admitting: Family Medicine

## 2018-05-04 MED ORDER — ALPRAZOLAM ER 0.5 MG PO TB24: 0.5000 mg | ORAL_TABLET | Freq: Every day | ORAL | 0 refills | Status: DC

## 2018-05-04 NOTE — Telephone Encounter (Signed)
Copied from CRM [id number]. Topic: Quick Communication - Rx Refill/Question >> May 04, 2018  9:14 AM Jolayne Haines L wrote: Medication: ALPRAZOLAM XR 1 MG 24 hr tablet  -- patient moved over the weekend and lost the pill bottle Has the patient contacted their pharmacy? No  (Agent: If no, request that the patient contact the pharmacy for the refill.) (Agent: If yes, when and what did the pharmacy advise?)  Preferred Pharmacy (with phone number or street name): [id number] - [place], [place] - [address] AT [facility] [address] [place] [address] Phone: [phone] Fax: [phone]    Agent: Please be advised that RX refills may take up to 3 business days. We ask that you follow-up with your pharmacy.

## 2018-05-05 ENCOUNTER — Encounter: Payer: Self-pay | Admitting: Family Medicine

## 2018-05-07 ENCOUNTER — Telehealth: Payer: BLUE CROSS/BLUE SHIELD | Admitting: Family

## 2018-05-07 ENCOUNTER — Telehealth: Payer: Self-pay | Admitting: Emergency Medicine

## 2018-05-07 DIAGNOSIS — R0602 Shortness of breath: Secondary | ICD-10-CM | POA: Diagnosis not present

## 2018-05-07 MED ORDER — BENZONATATE 200 MG PO CAPS: 200.0000 mg | ORAL_CAPSULE | Freq: Three times a day (TID) | ORAL | 0 refills | Status: DC | PRN

## 2018-05-07 MED ORDER — ALBUTEROL SULFATE HFA 108 (90 BASE) MCG/ACT IN AERS: 2.0000 | INHALATION_SPRAY | Freq: Four times a day (QID) | RESPIRATORY_TRACT | 0 refills | Status: DC | PRN

## 2018-05-07 NOTE — Addendum Note (Signed)
Addended by: Beau Fanny on: 05/07/2018 11:29 AM   Modules accepted: Orders

## 2018-05-07 NOTE — Progress Notes (Signed)
Greater than 5 minutes, yet less than 10 minutes of time have been spent researching, coordinating, and implementing care for this patient today.  Thank you for the details you included in the comment boxes. Those details are very helpful in determining the best course of treatment for you and help [place] to provide the best care.  Due to your answers, you are considered a high-risk patient, especially with your recent need of a ventilator. See below.  Based on what you shared with me, I feel that you are considered high risk for Corona virus virus because of a known exposure, fever, shortness of breath and cough.  You should proceed to our testing site at [address] [place] [address].   I have placed an order for you to have the cornoavirus (COVID19) test done.  - You will be tested for (COVID-19) and discharged home on quarantine except to seek medical care if symptoms worsen, and asked to  - Stay home and avoid contact with others until you get your results (4-5 days)  - Avoid travel on public transportation if possible (such as bus, train, or airplane)  Continue to monitor at home and seek medical attention if your symptoms worsen.  If you are having a medical emergency, call 911.  You can use medications such as A prescription cough medication called Tessalon Perles 100 mg. You may take 1-2 capsules every 8 hours as needed for cough and A prescription inhaler called Albuterol MDI 90 mcg /actuation 2 puffs every 4 hours as needed for shortness of breath, wheezing, cough  Please review the forms below as these are required. PRINT, sign and complete and bring with you if possible to the testing site.     Person Under Monitoring Name: Alicia [name] Note [facility]  Location: [address] [place] [address]   CORONAVIRUS DISEASE 2019 (COVID-19) Guidance for Persons Under Investigation You are being tested for the virus that causes coronavirus disease 2019 (COVID-19). Public  health actions are necessary to ensure protection of your health and the health of others, and to prevent further spread of infection. COVID-19 is caused by a virus that can cause symptoms, such as fever, cough, and shortness of breath. The primary transmission from person to person is by coughing or sneezing. On March 19, 2018, the World Health Organization announced a [employer] Emergency of International Concern and on March 20, 2018 the [place]. Department of Health and Human Services declared a public health emergency. If the virus that causesCOVID-19 spreads in the community, it could have severe public health consequences.  As a person under investigation for COVID-19, the [employer] of Health and [employer], Division of [employer] advises you to adhere to the following guidance until your test results are reported to you. If your test result is positive, you will receive additional information from your provider and your local health department at that time.   Remain at home until you are cleared by your health provider or public health authorities.   Keep a log of visitors to your home using the form provided. Any visitors to your home must be aware of your isolation status.  If you plan to move to a new address or leave the county, notify the local health department in your county.  Call a doctor or seek care if you have an urgent medical need. Before seeking medical care, call ahead and get instructions from the provider before arriving at the medical office, clinic  or hospital. Notify them that you are being tested for the virus that causes COVID-19 so arrangements can be made, as necessary, to prevent transmission to others in the healthcare setting. Next, notify the local health department in your county.  If a medical emergency arises and you need to call 911, inform the first responders that you are being tested for the virus that causes COVID-19. Next, notify  the local health department in your county.  Adhere to all guidance set forth by the [facility] Division of [employer] for [facility] of patients that is based on guidance from the Center for Disease Control and Prevention with suspected or confirmed COVID-19. It is provided with this guidance for Persons Under Investigation.  Your health and the health of our community are our top priorities. Public Health officials remain available to provide assistance and counseling to you about COVID-19 and compliance with this guidance.  Provider: Constance Haw, DNP___ Date: ___03/19/20  By signing below, you acknowledge that you have read and agree to comply with this Guidance for Persons Under Investigation.  ______________________________________________________________    Date: ______/_____/_________  WHO DO I CALL? You can find a list of local health departments here: [email or web address] Health Department: ____________________________________________________________________ Contact Name: ________________________________________________________________________ Telephone: ___________________________________________________________________________  Nedra Hai, Division of Public Health, Communicable Disease Branch COVID-19 Guidance for Persons Under Investigation April 25, 2018   Person Under Monitoring Name: Chryl Wayson Note [name]  Location: [address] [place] [address]   Record here the list of visitors to your home since you became ill with respiratory symptoms that led you to consult a health provider:  Visitor Name Date Time In Time Out Did this person come within 6 feet of you? Indicate Y or N Relationship to Person Under Monitoring Phone number Comments   ___/____/____ __:__ AM/PM __:__ AM/PM       ___/____/____ __:__ AM/PM __:__ AM/PM       ___/____/____ __:__ AM/PM __:__ AM/PM       ___/____/____  __:__ AM/PM __:__ AM/PM       ___/____/____ __:__ AM/PM __:__ AM/PM       ___/____/____ __:__ AM/PM __:__ AM/PM       ___/____/____ __:__ AM/PM __:__ AM/PM       ___/____/____ __:__ AM/PM __:__ AM/PM       ___/____/____ __:__ AM/PM __:__ AM/PM       ___/____/____ __:__ AM/PM __:__ AM/PM       ___/____/____ __:__ AM/PM __:__ AM/PM       ___/____/____ __:__ AM/PM __:__ AM/PM       ___/____/____ __:__ AM/PM __:__ AM/PM       ___/____/____ __:__ AM/PM __:__ AM/PM       Nedra Hai, Division of Public Health, Communicable Disease Branch

## 2018-05-11 ENCOUNTER — Ambulatory Visit (INDEPENDENT_AMBULATORY_CARE_PROVIDER_SITE_OTHER): Payer: BLUE CROSS/BLUE SHIELD | Admitting: Family Medicine

## 2018-05-11 ENCOUNTER — Other Ambulatory Visit: Payer: Self-pay | Admitting: Family Medicine

## 2018-05-11 ENCOUNTER — Other Ambulatory Visit: Payer: Self-pay

## 2018-05-11 ENCOUNTER — Encounter: Payer: Self-pay | Admitting: Family Medicine

## 2018-05-11 VITALS — BP 110/68 | HR 103 | Temp 97.8°F | Resp 20 | Ht 66.0 in | Wt 190.5 lb

## 2018-05-11 DIAGNOSIS — R569 Unspecified convulsions: Secondary | ICD-10-CM | POA: Diagnosis not present

## 2018-05-11 DIAGNOSIS — Z8673 Personal history of transient ischemic attack (TIA), and cerebral infarction without residual deficits: Secondary | ICD-10-CM

## 2018-05-11 DIAGNOSIS — F411 Generalized anxiety disorder: Secondary | ICD-10-CM

## 2018-05-11 DIAGNOSIS — G4733 Obstructive sleep apnea (adult) (pediatric): Secondary | ICD-10-CM

## 2018-05-11 DIAGNOSIS — R000 Tachycardia, unspecified: Secondary | ICD-10-CM | POA: Diagnosis not present

## 2018-05-11 DIAGNOSIS — M542 Cervicalgia: Secondary | ICD-10-CM

## 2018-05-11 DIAGNOSIS — G43009 Migraine without aura, not intractable, without status migrainosus: Secondary | ICD-10-CM

## 2018-05-11 DIAGNOSIS — G8929 Other chronic pain: Secondary | ICD-10-CM

## 2018-05-11 DIAGNOSIS — R7303 Prediabetes: Secondary | ICD-10-CM

## 2018-05-11 DIAGNOSIS — E785 Hyperlipidemia, unspecified: Secondary | ICD-10-CM

## 2018-05-11 DIAGNOSIS — E559 Vitamin D deficiency, unspecified: Secondary | ICD-10-CM

## 2018-05-11 DIAGNOSIS — F5104 Psychophysiologic insomnia: Secondary | ICD-10-CM

## 2018-05-11 LAB — NOVEL CORONAVIRUS, NAA: SARS-CoV-2, NAA: NOT DETECTED

## 2018-05-11 MED ORDER — ALPRAZOLAM ER 1 MG PO TB24: 1.0000 mg | ORAL_TABLET | Freq: Every day | ORAL | 2 refills | Status: DC

## 2018-05-11 MED ORDER — ASPIRIN 325 MG PO TBEC: 325.0000 mg | DELAYED_RELEASE_TABLET | Freq: Every day | ORAL | 3 refills | Status: DC

## 2018-05-11 MED ORDER — CARVEDILOL 3.125 MG PO TABS: 3.1250 mg | ORAL_TABLET | Freq: Two times a day (BID) | ORAL | 1 refills | Status: DC

## 2018-05-11 MED ORDER — ICOSAPENT ETHYL 1 G PO CAPS: 1.0000 g | ORAL_CAPSULE | Freq: Two times a day (BID) | ORAL | 1 refills | Status: DC

## 2018-05-11 MED ORDER — ROSUVASTATIN CALCIUM 20 MG PO TABS: 20.0000 mg | ORAL_TABLET | Freq: Every day | ORAL | 1 refills | Status: DC

## 2018-05-11 MED ORDER — CYCLOBENZAPRINE HCL 5 MG PO TABS: 5.0000 mg | ORAL_TABLET | Freq: Every day | ORAL | 0 refills | Status: DC

## 2018-05-11 MED ORDER — ZOLPIDEM TARTRATE 10 MG PO TABS: 10.0000 mg | ORAL_TABLET | Freq: Every evening | ORAL | 0 refills | Status: DC | PRN
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
## 2018-05-11 NOTE — Telephone Encounter (Signed)
Refill request for general medication. Alprazolam   Last office visit 11/06/2017   Follow up on 05/12/2018

## 2018-05-12 ENCOUNTER — Ambulatory Visit: Payer: BLUE CROSS/BLUE SHIELD | Admitting: Family Medicine

## 2018-05-25 ENCOUNTER — Ambulatory Visit (INDEPENDENT_AMBULATORY_CARE_PROVIDER_SITE_OTHER): Payer: BLUE CROSS/BLUE SHIELD | Admitting: *Deleted

## 2018-05-25 ENCOUNTER — Other Ambulatory Visit: Payer: Self-pay

## 2018-05-25 DIAGNOSIS — I639 Cerebral infarction, unspecified: Secondary | ICD-10-CM | POA: Diagnosis not present

## 2018-05-25 LAB — CUP PACEART REMOTE DEVICE CHECK
Date Time Interrogation Session: 20200405003828
Implantable Pulse Generator Implant Date: 20181103

## 2018-06-03 NOTE — Progress Notes (Signed)
[facility] Summary Report / Loop Recorder 

## 2018-06-09 ENCOUNTER — Other Ambulatory Visit: Payer: Self-pay | Admitting: Family Medicine

## 2018-06-09 DIAGNOSIS — M542 Cervicalgia: Principal | ICD-10-CM

## 2018-06-09 DIAGNOSIS — G8929 Other chronic pain: Secondary | ICD-10-CM

## 2018-06-25 ENCOUNTER — Ambulatory Visit (INDEPENDENT_AMBULATORY_CARE_PROVIDER_SITE_OTHER): Payer: BLUE CROSS/BLUE SHIELD | Admitting: *Deleted

## 2018-06-25 ENCOUNTER — Other Ambulatory Visit: Payer: Self-pay

## 2018-06-25 DIAGNOSIS — I639 Cerebral infarction, unspecified: Secondary | ICD-10-CM | POA: Diagnosis not present

## 2018-06-26 LAB — CUP PACEART REMOTE DEVICE CHECK
Date Time Interrogation Session: 20200508003904
Implantable Pulse Generator Implant Date: 20181103

## 2018-06-30 NOTE — Progress Notes (Signed)
[facility] Summary Report / Loop Recorder 

## 2018-07-11 ENCOUNTER — Other Ambulatory Visit: Payer: Self-pay | Admitting: Family Medicine

## 2018-07-11 DIAGNOSIS — R000 Tachycardia, unspecified: Secondary | ICD-10-CM

## 2018-07-28 ENCOUNTER — Ambulatory Visit (INDEPENDENT_AMBULATORY_CARE_PROVIDER_SITE_OTHER): Payer: BC Managed Care – PPO | Admitting: *Deleted

## 2018-07-28 DIAGNOSIS — I639 Cerebral infarction, unspecified: Secondary | ICD-10-CM

## 2018-07-29 LAB — CUP PACEART REMOTE DEVICE CHECK
Date Time Interrogation Session: 20200610023803
Implantable Pulse Generator Implant Date: 20181103

## 2018-08-06 NOTE — Progress Notes (Signed)
[facility] Summary Report / Loop Recorder 

## 2018-08-10 ENCOUNTER — Other Ambulatory Visit: Payer: Self-pay | Admitting: Family Medicine

## 2018-08-10 DIAGNOSIS — G8929 Other chronic pain: Secondary | ICD-10-CM

## 2018-08-10 DIAGNOSIS — M542 Cervicalgia: Secondary | ICD-10-CM

## 2018-08-10 DIAGNOSIS — F5104 Psychophysiologic insomnia: Secondary | ICD-10-CM

## 2018-08-10 DIAGNOSIS — F411 Generalized anxiety disorder: Secondary | ICD-10-CM

## 2018-08-11 MED ORDER — CYCLOBENZAPRINE HCL 5 MG PO TABS: 5.0000 mg | ORAL_TABLET | Freq: Every day | ORAL | 0 refills | Status: DC

## 2018-08-11 MED ORDER — ZOLPIDEM TARTRATE 10 MG PO TABS: 10.0000 mg | ORAL_TABLET | Freq: Every evening | ORAL | 0 refills | Status: DC | PRN

## 2018-08-11 MED ORDER — ALPRAZOLAM ER 1 MG PO TB24: 1.0000 mg | ORAL_TABLET | Freq: Every day | ORAL | 0 refills | Status: DC

## 2018-08-11 NOTE — Telephone Encounter (Addendum)
Relation to pt: self  Call back number: [phone] Pharmacy: [facility] [place], [place] [phone] ([phone] (Fax)     Reason for call:  Patient checking on the status of message mentioned below, patient states she's completely out and would like Rx's sent in today, please advise

## 2018-08-11 NOTE — Addendum Note (Signed)
Addended by: Steele Sizer F on: 08/11/2018 12:37 PM   Modules accepted: Orders

## 2018-08-11 NOTE — Addendum Note (Signed)
Addended by: Chilton Greathouse on: 08/11/2018 10:59 AM   Modules accepted: Orders

## 2018-08-11 NOTE — Telephone Encounter (Signed)
Patient re

## 2018-08-13 ENCOUNTER — Encounter: Payer: Self-pay | Admitting: Family Medicine

## 2018-08-13 ENCOUNTER — Other Ambulatory Visit: Payer: Self-pay

## 2018-08-13 ENCOUNTER — Ambulatory Visit (INDEPENDENT_AMBULATORY_CARE_PROVIDER_SITE_OTHER): Payer: BC Managed Care – PPO | Admitting: Family Medicine

## 2018-08-13 DIAGNOSIS — Z8673 Personal history of transient ischemic attack (TIA), and cerebral infarction without residual deficits: Secondary | ICD-10-CM

## 2018-08-13 DIAGNOSIS — E781 Pure hyperglyceridemia: Secondary | ICD-10-CM

## 2018-08-13 DIAGNOSIS — F411 Generalized anxiety disorder: Secondary | ICD-10-CM | POA: Diagnosis not present

## 2018-08-13 DIAGNOSIS — K219 Gastro-esophageal reflux disease without esophagitis: Secondary | ICD-10-CM

## 2018-08-13 DIAGNOSIS — G4733 Obstructive sleep apnea (adult) (pediatric): Secondary | ICD-10-CM

## 2018-08-13 DIAGNOSIS — G8929 Other chronic pain: Secondary | ICD-10-CM

## 2018-08-13 DIAGNOSIS — M542 Cervicalgia: Secondary | ICD-10-CM

## 2018-08-13 DIAGNOSIS — F5104 Psychophysiologic insomnia: Secondary | ICD-10-CM

## 2018-08-13 MED ORDER — ZOLPIDEM TARTRATE 10 MG PO TABS: 10.0000 mg | ORAL_TABLET | Freq: Every evening | ORAL | 0 refills | Status: DC | PRN

## 2018-08-13 MED ORDER — PANTOPRAZOLE SODIUM 40 MG PO TBEC: 40.0000 mg | DELAYED_RELEASE_TABLET | Freq: Every day | ORAL | 1 refills | Status: DC

## 2018-08-13 MED ORDER — ALPRAZOLAM ER 1 MG PO TB24: 1.0000 mg | ORAL_TABLET | Freq: Every day | ORAL | 2 refills | Status: DC

## 2018-08-13 MED ORDER — PAROXETINE HCL 40 MG PO TABS: 40.0000 mg | ORAL_TABLET | ORAL | 1 refills | Status: DC

## 2018-08-13 MED ORDER — CYCLOBENZAPRINE HCL 10 MG PO TABS: 10.0000 mg | ORAL_TABLET | Freq: Every day | ORAL | 0 refills | Status: DC
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
## 2018-08-22 ENCOUNTER — Other Ambulatory Visit: Payer: Self-pay | Admitting: Family Medicine

## 2018-08-23 ENCOUNTER — Other Ambulatory Visit: Payer: Self-pay | Admitting: Family Medicine

## 2018-08-31 ENCOUNTER — Ambulatory Visit (INDEPENDENT_AMBULATORY_CARE_PROVIDER_SITE_OTHER): Payer: BC Managed Care – PPO | Admitting: *Deleted

## 2018-08-31 DIAGNOSIS — Z8673 Personal history of transient ischemic attack (TIA), and cerebral infarction without residual deficits: Secondary | ICD-10-CM

## 2018-08-31 LAB — CUP PACEART REMOTE DEVICE CHECK
Date Time Interrogation Session: 20200713074139
Implantable Pulse Generator Implant Date: 20181103

## 2018-09-11 NOTE — Progress Notes (Signed)
[facility] Summary Report / Loop Recorder 

## 2018-09-16 ENCOUNTER — Encounter: Payer: Self-pay | Admitting: Family Medicine

## 2018-09-28 DIAGNOSIS — R21 Rash and other nonspecific skin eruption: Secondary | ICD-10-CM | POA: Diagnosis not present

## 2018-09-28 DIAGNOSIS — L719 Rosacea, unspecified: Secondary | ICD-10-CM | POA: Diagnosis not present

## 2018-09-28 DIAGNOSIS — L811 Chloasma: Secondary | ICD-10-CM | POA: Diagnosis not present

## 2018-09-28 DIAGNOSIS — L821 Other seborrheic keratosis: Secondary | ICD-10-CM | POA: Diagnosis not present

## 2018-09-29 ENCOUNTER — Encounter: Payer: Self-pay | Admitting: Family Medicine

## 2018-09-29 DIAGNOSIS — M310 Hypersensitivity angiitis: Secondary | ICD-10-CM | POA: Diagnosis not present

## 2018-09-30 ENCOUNTER — Ambulatory Visit (INDEPENDENT_AMBULATORY_CARE_PROVIDER_SITE_OTHER): Payer: BC Managed Care – PPO | Admitting: Nurse Practitioner

## 2018-09-30 ENCOUNTER — Encounter: Payer: Self-pay | Admitting: Nurse Practitioner

## 2018-09-30 ENCOUNTER — Ambulatory Visit
Admission: RE | Admit: 2018-09-30 | Discharge: 2018-09-30 | Disposition: A | Discharge status: Home / Self Care | Payer: BC Managed Care – PPO | Source: Ambulatory Visit | Attending: Family Medicine | Admitting: Family Medicine

## 2018-09-30 ENCOUNTER — Other Ambulatory Visit: Payer: Self-pay

## 2018-09-30 VITALS — BP 118/72 | HR 90 | Resp 14 | Wt 193.0 lb

## 2018-09-30 DIAGNOSIS — R0609 Other forms of dyspnea: Secondary | ICD-10-CM

## 2018-09-30 DIAGNOSIS — R590 Localized enlarged lymph nodes: Secondary | ICD-10-CM | POA: Diagnosis not present

## 2018-09-30 DIAGNOSIS — R600 Localized edema: Secondary | ICD-10-CM | POA: Diagnosis not present

## 2018-09-30 DIAGNOSIS — R079 Chest pain, unspecified: Secondary | ICD-10-CM | POA: Insufficient documentation

## 2018-09-30 DIAGNOSIS — R0602 Shortness of breath: Secondary | ICD-10-CM | POA: Diagnosis not present

## 2018-09-30 DIAGNOSIS — R0600 Dyspnea, unspecified: Secondary | ICD-10-CM

## 2018-09-30 NOTE — Progress Notes (Signed)
Patient ID: Remmi Armenteros Note Prete, female    DOB: 03-13-1970, 48 y.o.   MRN: [id number]  PCP: Alba Cory, MD  Chief Complaint  Patient presents with   Cyst    swollen/lymp on right side of neck, noticed it yesterday.  denies pain    Subjective:   Gavriella Hearst Note Kishbaugh is a 48 y.o. female, presents to clinic with CC of the following:  Swelling to right low neck/right upper chest, DOE, CP with exertion, near syncope Swelling and a lymph node noticed yesterday, all other sx gradual onset over the past couple months - DOE worse for 1 month. HPI copied from earlier virtual encounter this am -  Lump and swelling to right side of neck upper chest area, noticed yesterday Lump is little, mobile and firm, non-tender, no other lymph node, swelling or bumps noticed.  No erythema associated. Had some shortness of breath for the past month, associated with activity, such as going to the mailbox down the driveway and back she is short of breath.  No cough.  She reports a history of bronchitis and needing to use an albuterol inhaler occasionally she has been using it more frequently.  With exertion she does endorse some chest pain which is described as a pressure like someone is pressing or sitting on her.  She also feels like she just cannot take a deep breath and it feels somewhat restricted.  The symptoms with exertion are associated by feelings of fatigue and near syncope she will use her inhaler and lay down.  Last used her inhaler 1 week ago. She also endorses some new swelling daily to her feet and ankles it does occur in progress throughout the day and resolves at night.  She has gained some weight, no other areas of swelling, she denies PND, states that she does sleep propped up on one pillow and that laying down does make it feel little bit tighter in her chest. She has some hx of tachycardia, denies any new palpitation sx different from her hx. she has a loop recorder in her chest,  for the past 20 months no "problems with that." She recently had a biopsy on a rash that is located to her bilateral upper extremities, from forearms and distally.  She has pruritus she states temperature on her body will be in one area for a few days and then change to a different area but itching is not associated with any rash. Also mentions some abdominal bloating and abdominal discomfort, some pain and stiffness in her knuckles bilaterally, and occasional chills.  Also has some breast tenderness she is behind on mammograms does not know her last time she got one done, has not noticed any lumps, skin changes, nipple discharge or any nodules to breast or armpits. She has her son who lives with her and 2 grandchildren, no known sick contacts.  She herself denies any recent illness.    Pertinent negatives:  Uri sx, sore throat, neck pain, trauma or strain to neck or shoulders, fever, dysphagia, change to quality of voice, night sweats, fever  Did record review of her last visit with her PCP, most recent CT imaging, most recent echocardiogram, and most recent labs  Patient Active Problem List   Diagnosis Date Noted   Chronic insomnia 02/09/2018   History of anemia 02/09/2018   Sleep apnea 09/24/2017   History of pseudoseizure 09/09/2017   History of stroke 05/30/2017   Hyperlipidemia 05/30/2017   Migraine  without aura and without status migrainosus, not intractable 05/27/2017   Pain in right knee 01/21/2017   Psychogenic nonepileptic seizure    Chronic pain syndrome    Restless leg syndrome    Chronic prescription benzodiazepine use 01/20/2016   Leukocytosis 01/20/2016   Seizure-like activity (HCC)    GAD (generalized anxiety disorder) 02/14/2015   Chronic neck pain 02/14/2015   History of hysterectomy 02/14/2015   Grieving 12/16/2014   Iron deficiency anemia due to chronic blood loss 08/29/2014   Insomnia, persistent 08/14/2014   Major depression ([place]) 08/14/2014     Temporomandibular joint sounds on opening and/or closing the jaw 08/14/2014   Degenerative disc disease, lumbar 08/14/2014   Bleeding internal hemorrhoids 08/14/2014   Gastric reflux 08/14/2014   Blood glucose elevated 08/14/2014   Irritable bowel syndrome with constipation 08/14/2014   Hypertriglyceridemia 08/14/2014   Overweight 08/14/2014   Tinnitus 08/14/2014   Vitamin D deficiency 08/14/2014   Tachycardia 11/25/2012   DOE (dyspnea on exertion) 11/06/2012      Current Outpatient Medications:    albuterol (PROVENTIL HFA;VENTOLIN HFA) 108 (90 Base) MCG/ACT inhaler, Inhale 2 puffs into the lungs every 6 (six) hours as needed for shortness of breath., Disp: 1 Inhaler, Rfl: 0   ALPRAZolam (XANAX XR) 1 MG 24 hr tablet, Take 1 tablet (1 mg total) by mouth daily., Disp: 30 tablet, Rfl: 2   aspirin 325 MG EC tablet, Take 1 tablet (325 mg total) by mouth daily., Disp: 90 tablet, Rfl: 3   carvedilol (COREG) 3.125 MG tablet, TAKE 1 TABLET(3.125 MG) BY MOUTH TWICE DAILY WITH A MEAL, Disp: 180 tablet, Rfl: 1   Coenzyme Q10 (COQ-10) 200 MG CAPS, Take 1 capsule by mouth daily., Disp: , Rfl:    cyclobenzaprine (FLEXERIL) 10 MG tablet, Take 1 tablet (10 mg total) by mouth at bedtime. TAKE 1 TABLET(10 MG) BY MOUTH DAILY AS NEEDED FOR MUSCLE SPASMS, Disp: 90 tablet, Rfl: 0   Icosapent Ethyl (VASCEPA) 1 g CAPS, Take 1 capsule (1 g total) by mouth 2 (two) times daily., Disp: 180 capsule, Rfl: 1   pantoprazole (PROTONIX) 40 MG tablet, Take 1 tablet (40 mg total) by mouth daily., Disp: 90 tablet, Rfl: 1   PARoxetine (PAXIL) 40 MG tablet, Take 1 tablet (40 mg total) by mouth every morning., Disp: 90 tablet, Rfl: 1   rosuvastatin (CRESTOR) 20 MG tablet, Take 1 tablet (20 mg total) by mouth daily., Disp: 90 tablet, Rfl: 1   Vitamin D, Ergocalciferol, (DRISDOL) 1.25 MG (50000 UT) CAPS capsule, TAKE 1 CAPSULE BY MOUTH EVERY 7 DAYS, Disp: 12 capsule, Rfl: 0   zolpidem (AMBIEN) 10 MG  tablet, Take 1 tablet (10 mg total) by mouth at bedtime as needed for sleep., Disp: 90 tablet, Rfl: 0   Allergies  Allergen Reactions   Azithromycin Diarrhea   Penicillins Hives and Swelling    Has patient had a PCN reaction causing immediate rash, facial/tongue/throat swelling, SOB or lightheadedness with hypotension: Yes Has patient had a PCN reaction causing severe rash involving mucus membranes or skin necrosis: No Has patient had a PCN reaction that required hospitalization No Has patient had a PCN reaction occurring within the last 10 years: No If all of the above answers are "NO", then may proceed with Cephalosporin use.     Family History  Problem Relation Age of Onset   Hypertension Mother    Hyperlipidemia Mother    Heart Problems Father        hole in heart and  lower ventricles reversed   Prostate cancer Maternal Grandfather    Von Willebrand disease Maternal Uncle      Social History   Socioeconomic History   Marital status: Married    Spouse name: John    Number of children: 4   Years of education: Not on file   Highest education level: Not on file  Occupational History   Occupation: Agricultural engineeraccountant    Employer: [employer]  Social Needs   Financial resource strain: Somewhat hard   Food insecurity    Worry: Never true    Inability: Never true   Transportation needs    Medical: No    Non-medical: No  Tobacco Use   Smoking status: Never Smoker   Smokeless tobacco: Never Used  Substance and Sexual Activity   Alcohol use: No    Alcohol/week: 0.0 standard drinks   Drug use: No   Sexual activity: Yes    Partners: Male  Lifestyle   Physical activity    Days per week: 5 days    Minutes per session: 30 min   Stress: Not at all  Relationships   Social connections    Talks on phone: More than three times a week    Gets together: More than three times a week    Attends religious service: Never    Active member of club or  organization: No    Attends meetings of clubs or organizations: Never    Relationship status: Married   Intimate partner violence    Fear of current or ex partner: No    Emotionally abused: No    Physically abused: No    Forced sexual activity: No  Other Topics Concern   Not on file  Social History Narrative   First husband died suddenly in 2016   Remarried 06/07/2016    I personally reviewed active problem list, medication list, allergies, social history, notes from last encounter, lab results, imaging with the patient/caregiver today.  Review of Systems  Constitutional: Positive for unexpected weight change. Negative for activity change, appetite change, chills, diaphoresis, fatigue and fever.  HENT: Positive for sinus pressure and sinus pain. Negative for congestion, ear discharge, ear pain, postnasal drip, rhinorrhea, sneezing, sore throat, trouble swallowing and voice change.   Eyes: Negative.   Respiratory: Positive for apnea (OSA - compliant with CPAP), chest tightness and shortness of breath. Negative for cough, choking, wheezing and stridor.   Cardiovascular: Positive for chest pain, palpitations and leg swelling.  Gastrointestinal: Negative.  Negative for abdominal pain, blood in stool, constipation, diarrhea, nausea and vomiting.  Endocrine: Negative.   Genitourinary: Negative.   Musculoskeletal: Positive for arthralgias. Negative for back pain, gait problem, joint swelling, myalgias, neck pain and neck stiffness.  Skin: Positive for rash. Negative for color change and pallor.  Allergic/Immunologic: Negative.  Negative for immunocompromised state.  Neurological: Positive for light-headedness. Negative for tremors, syncope, weakness, numbness and headaches.  Hematological: Positive for adenopathy. Does not bruise/bleed easily.  All other systems reviewed and are negative.      Objective:   Vitals:   09/30/18 0817  BP: 118/72  Pulse: 90  Resp: 14  SpO2: 93%    Weight: 193 lb (87.5 kg)    Body mass index is 31.15 kg/m.  Physical Exam Vitals signs and nursing note reviewed.  Constitutional:      General: She is not in acute distress.    Appearance: Normal appearance. She is well-developed. She is not ill-appearing, toxic-appearing or diaphoretic.  HENT:  Head: Normocephalic and atraumatic.     Jaw: There is normal jaw occlusion.     Salivary Glands: Right salivary gland is not diffusely enlarged. Left salivary gland is not diffusely enlarged.     Right Ear: Tympanic membrane, ear canal and external ear normal. There is no impacted cerumen.     Left Ear: Tympanic membrane, ear canal and external ear normal. There is no impacted cerumen.     Nose: Nose normal. No congestion or rhinorrhea.     Right Turbinates: Enlarged.     Left Turbinates: Enlarged.     Mouth/Throat:     Lips: Pink.     Mouth: Mucous membranes are moist.     Pharynx: Oropharynx is clear. Uvula midline. No pharyngeal swelling, oropharyngeal exudate, posterior oropharyngeal erythema or uvula swelling.     Tonsils: No tonsillar exudate. 0 on the right. 0 on the left.     Comments: Some small clear vesicles on soft palate No pallor Eyes:     General: Lids are normal.     Conjunctiva/sclera: Conjunctivae normal.     Pupils: Pupils are equal, round, and reactive to light.  Neck:     Musculoskeletal: Normal range of motion and neck supple. No neck rigidity or muscular tenderness.     Trachea: Phonation normal. No tracheal deviation.  Cardiovascular:     Rate and Rhythm: Normal rate and regular rhythm.  No extrasystoles are present.    Chest Wall: PMI is not displaced.     Pulses: Normal pulses.          Radial pulses are 2+ on the right side and 2+ on the left side.       Dorsalis pedis pulses are 2+ on the right side and 2+ on the left side.       Posterior tibial pulses are 2+ on the right side and 2+ on the left side.     Heart sounds: Normal heart sounds. Heart  sounds not distant. No murmur. No friction rub. No gallop.   Pulmonary:     Effort: Pulmonary effort is normal. No tachypnea, accessory muscle usage, respiratory distress or retractions.     Breath sounds: Normal air entry. No stridor. Examination of the right-lower field reveals decreased breath sounds and rhonchi. Decreased breath sounds and rhonchi present. No wheezing or rales.  Chest:     Chest wall: Edema present. No mass or tenderness.     Comments: Right supraclavicular fullness to right SCM, more subtle in person than on virtual encounter Small mobile right supraclavicular lymph node, non-tender, no erythema, no induration Abdominal:     General: Bowel sounds are normal. There is no distension.     Palpations: Abdomen is soft.     Tenderness: There is no abdominal tenderness.  Musculoskeletal: Normal range of motion.        General: No deformity.     Right lower leg: No edema.     Left lower leg: No edema.  Lymphadenopathy:     Cervical: No cervical adenopathy.     Upper Body:     Right upper body: Supraclavicular adenopathy present. No axillary or pectoral adenopathy.     Left upper body: No supraclavicular, axillary or pectoral adenopathy.  Skin:    General: Skin is warm and dry.     Capillary Refill: Capillary refill takes less than 2 seconds.     Coloration: Skin is not pale.     Findings: No rash.  Neurological:  Mental Status: She is alert and oriented to person, place, and time.     Motor: No weakness or abnormal muscle tone.     Coordination: Coordination normal.     Gait: Gait normal.  Psychiatric:        Mood and Affect: Mood normal.        Speech: Speech normal.        Behavior: Behavior normal.            Assessment & Plan:   48 y/o female with complicated medical history, presented originally this am for upperchest/lower right neck lymph node/nodule/swelling, brought into office for in person exam/vitals. In person the fullness is slightly less  impressive it is very subtle there is a palpable right supraclavicular lymph node that is small mobile nontender.  Further examination revealed no cervical lymphadenopathy no left-sided supraclavicular lymphadenopathy, no axillary or pectoral lymphadenopathy.  She had little bit of nasal turbinate enlargement and some clear vesicles in her throat but nothing overtly concerning for infection.  Her cardiac exam was unremarkable with symmetrical pulses no lower extremity edema, reassuring rate and rhythm.  Pulmonary exam showed slightly diminished breath sounds at the right base with some coarse breath sounds.    ICD-10-CM   1. Supraclavicular adenopathy  R59.0 CBC with Differential/Platelet    BASIC METABOLIC PANEL WITH GFR    DG Chest 2 View  2. DOE (dyspnea on exertion)  R06.09 DG Chest 2 View    Brain natriuretic peptide  3. Lower extremity edema  R60.0 Brain natriuretic peptide  4. Chest pain, unspecified type  R07.9 DG Chest 2 View    Brain natriuretic peptide    Plan is to get a chest x-ray make sure there is no mediastinal widening or abnormal cardiac silhouette, pulmonary edema, pleural effusions or other abnormalities.  Will get basic blood work to check her renal function, electrolytes, CBC make sure there is no concerning leukocytosis.  Added BNP to reassure no CHF although she does appear euvolemic, recent echo was normal, she has a loop recorder in which is not triggered any alerts and her cardiac exam is unremarkable here today.  Discussed close follow-up pain attention to any change in the area of fullness any enlargement of the lymph node and will arrange follow-up and further testing based off of monitoring and results from today.  CP unlikely to be ACS - hx not suggestive.  May need further pulmonary work up such as PFT's or CT chest with contrast?   Danelle BerryLeisa Murice Barbar, PA-C 09/30/18 9:39 AM

## 2018-09-30 NOTE — Progress Notes (Signed)
Name: Alicia [name]   MRN: [name]    DOB: 27-Oct-1970   Date:09/30/2018       Progress Note  Subjective:    Chief Complaint  Chief Complaint  Patient presents with  . Cyst    swollen/lymp on right side of neck, noticed it yesterday.  denies pain    I connected with  Alicia [name]  on 09/30/18 at  8:20 AM EDT by a video enabled telemedicine application and verified that I am speaking with the correct person using two identifiers.  I discussed the limitations of evaluation and management by telemedicine and the availability of in person appointments. The patient expressed understanding and agreed to proceed. Staff also discussed with the patient that there may be a patient responsible charge related to this service. Patient Location: home Provider Location: [facility] - in office Additional Individuals present: myself and pt  HPI Lump and swelling to right side of neck upper chest area, noticed yesterday Lump a little mobile and firm Had some shortness of breath for the past month, associated with activity, such as going to the mailbox down the driveway and back she is short of breath.  No cough, no productive cough.  Some swelling to feet and ankles, new.   Some chest pressure and like she can't get a deep breath.  She has some hx of tachycardia, denies any new palpitation sx different from her hx.   Inhaler, using some for the worsening SOB Pertinent negatives:  Uri sx, sore throat, neck pain, trauma or strain to neck or shoulders, fever, dysphagia, change to quality of voice     Patient Active Problem List   Diagnosis Date Noted  . Chronic insomnia 02/09/2018  . History of anemia 02/09/2018  . Sleep apnea 09/24/2017  . History of pseudoseizure 09/09/2017  . History of stroke 05/30/2017  . Hyperlipidemia 05/30/2017  . Migraine without aura and without status migrainosus, not intractable 05/27/2017  . Pain in right knee 01/21/2017  . Psychogenic nonepileptic  seizure   . Chronic pain syndrome   . Restless leg syndrome   . Chronic prescription benzodiazepine use 01/20/2016  . Leukocytosis 01/20/2016  . Seizure-like activity (HCC)   . GAD (generalized anxiety disorder) 02/14/2015  . Chronic neck pain 02/14/2015  . History of hysterectomy 02/14/2015  . Grieving 12/16/2014  . Iron deficiency anemia due to chronic blood loss 08/29/2014  . Insomnia, persistent 08/14/2014  . Major depression (HCC) 08/14/2014  . Temporomandibular joint sounds on opening and/or closing the jaw 08/14/2014  . Degenerative disc disease, lumbar 08/14/2014  . Bleeding internal hemorrhoids 08/14/2014  . Gastric reflux 08/14/2014  . Blood glucose elevated 08/14/2014  . Irritable bowel syndrome with constipation 08/14/2014  . Hypertriglyceridemia 08/14/2014  . Overweight 08/14/2014  . Tinnitus 08/14/2014  . Vitamin D deficiency 08/14/2014  . Tachycardia 11/25/2012  . DOE (dyspnea on exertion) 11/06/2012    Social History   Tobacco Use  . Smoking status: Never Smoker  . Smokeless tobacco: Never Used  Substance Use Topics  . Alcohol use: No    Alcohol/week: 0.0 standard drinks     Current Outpatient Medications:  .  albuterol (PROVENTIL HFA;VENTOLIN HFA) 108 (90 Base) MCG/ACT inhaler, Inhale 2 puffs into the lungs every 6 (six) hours as needed for shortness of breath., Disp: 1 Inhaler, Rfl: 0 .  ALPRAZolam (XANAX XR) 1 MG 24 hr tablet, Take 1 tablet (1 mg total) by mouth daily., Disp: 30 tablet, Rfl: 2 .  aspirin 325 MG EC tablet, Take 1 tablet (325 mg total) by mouth daily., Disp: 90 tablet, Rfl: 3 .  carvedilol (COREG) 3.125 MG tablet, TAKE 1 TABLET(3.125 MG) BY MOUTH TWICE DAILY WITH A MEAL, Disp: 180 tablet, Rfl: 1 .  Coenzyme Q10 (COQ-10) 200 MG CAPS, Take 1 capsule by mouth daily., Disp: , Rfl:  .  cyclobenzaprine (FLEXERIL) 10 MG tablet, Take 1 tablet (10 mg total) by mouth at bedtime. TAKE 1 TABLET(10 MG) BY MOUTH DAILY AS NEEDED FOR MUSCLE SPASMS, Disp:  90 tablet, Rfl: 0 .  Icosapent Ethyl (VASCEPA) 1 g CAPS, Take 1 capsule (1 g total) by mouth 2 (two) times daily., Disp: 180 capsule, Rfl: 1 .  pantoprazole (PROTONIX) 40 MG tablet, Take 1 tablet (40 mg total) by mouth daily., Disp: 90 tablet, Rfl: 1 .  PARoxetine (PAXIL) 40 MG tablet, Take 1 tablet (40 mg total) by mouth every morning., Disp: 90 tablet, Rfl: 1 .  rosuvastatin (CRESTOR) 20 MG tablet, Take 1 tablet (20 mg total) by mouth daily., Disp: 90 tablet, Rfl: 1 .  Vitamin D, Ergocalciferol, (DRISDOL) 1.25 MG (50000 UT) CAPS capsule, TAKE 1 CAPSULE BY MOUTH EVERY 7 DAYS, Disp: 12 capsule, Rfl: 0 .  zolpidem (AMBIEN) 10 MG tablet, Take 1 tablet (10 mg total) by mouth at bedtime as needed for sleep., Disp: 90 tablet, Rfl: 0  Allergies  Allergen Reactions  . Azithromycin Diarrhea  . Penicillins Hives and Swelling    Has patient had a PCN reaction causing immediate rash, facial/tongue/throat swelling, SOB or lightheadedness with hypotension: Yes Has patient had a PCN reaction causing severe rash involving mucus membranes or skin necrosis: No Has patient had a PCN reaction that required hospitalization No Has patient had a PCN reaction occurring within the last 10 years: No If all of the above answers are "NO", then may proceed with Cephalosporin use.    ROS   Objective:   Virtual encounter, vitals limited, only able to obtain the following Today's Vitals   09/30/18 0817  Weight: 193 lb (87.5 kg)   Body mass index is 31.15 kg/m. Nursing Note and Vital Signs reviewed.  Physical Exam Vitals signs reviewed.  Constitutional:      General: She is not in acute distress.    Appearance: She is not toxic-appearing.  HENT:     Head: Normocephalic and atraumatic.  Neck:     Musculoskeletal: Normal range of motion. Normal range of motion. No erythema.     Trachea: Trachea and phonation normal.     Comments: Visible area of fullness to right upper chest and lower neck to the right  supraclavicular area, visible asymetrical compared to her left side  Pulmonary:     Effort: No respiratory distress.     Comments: No audible wheeze or stridor Neurological:     Mental Status: She is alert.     PE limited by telephone encounter    Assessment and Plan:     ICD-10-CM   1. Supraclavicular adenopathy  R59.0   - pt to come in for PE Needs good pulm, cardiac, and lymphatic exam - breast exam, check cervical, axillary, supraclavicular lymphnodes. Considering imaging to work up - unilateral non-tender lymphadenopathy more concerning to me than unilateral tender lymphadenopathy/reactive lymph nodes   Pt agreed to come in for further eval today  I provided 15 minutes of non-face-to-face time during this encounter. Pt advised to come into office today for in person completion of this encounter - requires hands  on physical exam to fully assess  Delsa Grana, PA-C 09/30/18 8:22 AM

## 2018-10-01 ENCOUNTER — Other Ambulatory Visit: Payer: Self-pay | Admitting: Family Medicine

## 2018-10-01 ENCOUNTER — Encounter: Payer: Self-pay | Admitting: Family Medicine

## 2018-10-01 DIAGNOSIS — R0600 Dyspnea, unspecified: Secondary | ICD-10-CM

## 2018-10-01 LAB — CBC WITH DIFFERENTIAL/PLATELET
Absolute Monocytes: 300 cells/uL (ref 200–950)
Basophils Absolute: 38 cells/uL (ref 0–200)
Basophils Relative: 0.5 %
Eosinophils Absolute: 98 cells/uL (ref 15–500)
Eosinophils Relative: 1.3 %
HCT: 39.7 % (ref 35.0–45.0)
Hemoglobin: 13.5 g/dL (ref 11.7–15.5)
Lymphs Abs: 1890 cells/uL (ref 850–3900)
MCH: 30.9 pg (ref 27.0–33.0)
MCHC: 34 g/dL (ref 32.0–36.0)
MCV: 90.8 fL (ref 80.0–100.0)
MPV: 10 fL (ref 7.5–12.5)
Monocytes Relative: 4 %
Neutro Abs: 5175 cells/uL (ref 1500–7800)
Neutrophils Relative %: 69 %
Platelets: 256 10*3/uL (ref 140–400)
RBC: 4.37 10*6/uL (ref 3.80–5.10)
RDW: 12.7 % (ref 11.0–15.0)
Total Lymphocyte: 25.2 %
WBC: 7.5 10*3/uL (ref 3.8–10.8)

## 2018-10-01 LAB — BRAIN NATRIURETIC PEPTIDE: Brain Natriuretic Peptide: 4 pg/mL (ref <100)

## 2018-10-01 LAB — BASIC METABOLIC PANEL WITH GFR
BUN: 20 mg/dL (ref 7–25)
CO2: 29 mmol/L (ref 20–32)
Calcium: 9.3 mg/dL (ref 8.6–10.2)
Chloride: 103 mmol/L (ref 98–110)
Creat: 0.98 mg/dL (ref 0.50–1.10)
GFR, Est African American: 79 mL/min/{1.73_m2} (ref >=60)
GFR, Est Non African American: 68 mL/min/{1.73_m2} (ref >=60)
Glucose, Bld: 107 mg/dL — ABNORMAL HIGH (ref 65–99)
Potassium: 4.7 mmol/L (ref 3.5–5.3)
Sodium: 139 mmol/L (ref 135–146)

## 2018-10-01 MED ORDER — BUDESONIDE-FORMOTEROL FUMARATE 80-4.5 MCG/ACT IN AERO: 2.0000 | INHALATION_SPRAY | Freq: Two times a day (BID) | RESPIRATORY_TRACT | 3 refills | Status: DC

## 2018-10-04 LAB — CUP PACEART REMOTE DEVICE CHECK
Date Time Interrogation Session: 20200815080537
Implantable Pulse Generator Implant Date: 20181103

## 2018-10-05 ENCOUNTER — Other Ambulatory Visit: Payer: Self-pay | Admitting: Family Medicine

## 2018-10-05 ENCOUNTER — Encounter: Payer: Self-pay | Admitting: Family Medicine

## 2018-10-05 ENCOUNTER — Ambulatory Visit (INDEPENDENT_AMBULATORY_CARE_PROVIDER_SITE_OTHER): Payer: BC Managed Care – PPO | Admitting: *Deleted

## 2018-10-05 DIAGNOSIS — I639 Cerebral infarction, unspecified: Secondary | ICD-10-CM | POA: Diagnosis not present

## 2018-10-05 DIAGNOSIS — L958 Other vasculitis limited to the skin: Secondary | ICD-10-CM | POA: Diagnosis not present

## 2018-10-06 ENCOUNTER — Other Ambulatory Visit: Payer: Self-pay | Admitting: Family Medicine

## 2018-10-06 ENCOUNTER — Encounter: Payer: Self-pay | Admitting: Family Medicine

## 2018-10-06 DIAGNOSIS — I776 Arteritis, unspecified: Secondary | ICD-10-CM

## 2018-10-07 DIAGNOSIS — M310 Hypersensitivity angiitis: Secondary | ICD-10-CM | POA: Insufficient documentation

## 2018-10-07 DIAGNOSIS — Z8669 Personal history of other diseases of the nervous system and sense organs: Secondary | ICD-10-CM | POA: Diagnosis not present

## 2018-10-07 DIAGNOSIS — R0600 Dyspnea, unspecified: Secondary | ICD-10-CM | POA: Diagnosis not present

## 2018-10-07 DIAGNOSIS — I63442 Cerebral infarction due to embolism of left cerebellar artery: Secondary | ICD-10-CM | POA: Diagnosis not present

## 2018-10-13 ENCOUNTER — Encounter: Payer: Self-pay | Admitting: Family Medicine

## 2018-10-13 NOTE — Progress Notes (Signed)
[facility] Summary Report / Loop Recorder 

## 2018-10-21 ENCOUNTER — Other Ambulatory Visit: Payer: Self-pay | Admitting: Neurology

## 2018-10-21 ENCOUNTER — Other Ambulatory Visit (HOSPITAL_COMMUNITY): Payer: Self-pay | Admitting: Neurology

## 2018-10-21 DIAGNOSIS — Z8669 Personal history of other diseases of the nervous system and sense organs: Secondary | ICD-10-CM | POA: Diagnosis not present

## 2018-10-21 DIAGNOSIS — R4189 Other symptoms and signs involving cognitive functions and awareness: Secondary | ICD-10-CM | POA: Diagnosis not present

## 2018-10-21 DIAGNOSIS — F419 Anxiety disorder, unspecified: Secondary | ICD-10-CM | POA: Diagnosis not present

## 2018-10-21 DIAGNOSIS — E538 Deficiency of other specified B group vitamins: Secondary | ICD-10-CM | POA: Diagnosis not present

## 2018-10-21 DIAGNOSIS — I63442 Cerebral infarction due to embolism of left cerebellar artery: Secondary | ICD-10-CM | POA: Diagnosis not present

## 2018-10-21 DIAGNOSIS — E559 Vitamin D deficiency, unspecified: Secondary | ICD-10-CM | POA: Diagnosis not present

## 2018-10-21 DIAGNOSIS — R531 Weakness: Secondary | ICD-10-CM

## 2018-10-22 ENCOUNTER — Other Ambulatory Visit: Payer: Self-pay | Admitting: Neurology

## 2018-10-22 DIAGNOSIS — R531 Weakness: Secondary | ICD-10-CM

## 2018-10-28 DIAGNOSIS — Z8673 Personal history of transient ischemic attack (TIA), and cerebral infarction without residual deficits: Secondary | ICD-10-CM | POA: Diagnosis not present

## 2018-10-28 DIAGNOSIS — M310 Hypersensitivity angiitis: Secondary | ICD-10-CM | POA: Diagnosis not present

## 2018-11-03 ENCOUNTER — Other Ambulatory Visit: Payer: Self-pay

## 2018-11-03 ENCOUNTER — Ambulatory Visit
Admission: RE | Admit: 2018-11-03 | Discharge: 2018-11-03 | Disposition: A | Discharge status: Home / Self Care | Payer: BC Managed Care – PPO | Source: Ambulatory Visit | Attending: Neurology | Admitting: Neurology

## 2018-11-03 DIAGNOSIS — R531 Weakness: Secondary | ICD-10-CM | POA: Insufficient documentation

## 2018-11-03 MED ORDER — GADOBUTROL 1 MMOL/ML IV SOLN
7.5000 mL | Freq: Once | INTRAVENOUS | Status: AC | PRN
  Administered 2018-11-03: 7.5 mL via INTRAVENOUS

## 2018-11-05 ENCOUNTER — Ambulatory Visit (INDEPENDENT_AMBULATORY_CARE_PROVIDER_SITE_OTHER): Payer: BC Managed Care – PPO | Admitting: *Deleted

## 2018-11-05 DIAGNOSIS — I639 Cerebral infarction, unspecified: Secondary | ICD-10-CM

## 2018-11-05 LAB — CUP PACEART REMOTE DEVICE CHECK
Date Time Interrogation Session: 20200917121155
Implantable Pulse Generator Implant Date: 20181103

## 2018-11-09 NOTE — Progress Notes (Signed)
[facility] Summary Report / Loop Recorder 

## 2018-11-10 ENCOUNTER — Encounter: Payer: Self-pay | Admitting: Family Medicine

## 2018-11-10 ENCOUNTER — Other Ambulatory Visit: Payer: Self-pay

## 2018-11-10 ENCOUNTER — Ambulatory Visit: Payer: BC Managed Care – PPO | Admitting: Family Medicine

## 2018-11-10 VITALS — BP 110/80 | HR 103 | Temp 97.1°F | Resp 16 | Ht 66.0 in | Wt 193.4 lb

## 2018-11-10 DIAGNOSIS — G894 Chronic pain syndrome: Secondary | ICD-10-CM

## 2018-11-10 DIAGNOSIS — Z23 Encounter for immunization: Secondary | ICD-10-CM | POA: Diagnosis not present

## 2018-11-10 DIAGNOSIS — E785 Hyperlipidemia, unspecified: Secondary | ICD-10-CM

## 2018-11-10 DIAGNOSIS — F5104 Psychophysiologic insomnia: Secondary | ICD-10-CM

## 2018-11-10 DIAGNOSIS — G8929 Other chronic pain: Secondary | ICD-10-CM

## 2018-11-10 DIAGNOSIS — M542 Cervicalgia: Secondary | ICD-10-CM

## 2018-11-10 DIAGNOSIS — F411 Generalized anxiety disorder: Secondary | ICD-10-CM

## 2018-11-10 DIAGNOSIS — G43009 Migraine without aura, not intractable, without status migrainosus: Secondary | ICD-10-CM

## 2018-11-10 DIAGNOSIS — G4733 Obstructive sleep apnea (adult) (pediatric): Secondary | ICD-10-CM

## 2018-11-10 DIAGNOSIS — E538 Deficiency of other specified B group vitamins: Secondary | ICD-10-CM

## 2018-11-10 DIAGNOSIS — F330 Major depressive disorder, recurrent, mild: Secondary | ICD-10-CM | POA: Diagnosis not present

## 2018-11-10 DIAGNOSIS — R0602 Shortness of breath: Secondary | ICD-10-CM

## 2018-11-10 DIAGNOSIS — R7303 Prediabetes: Secondary | ICD-10-CM

## 2018-11-10 DIAGNOSIS — R109 Unspecified abdominal pain: Secondary | ICD-10-CM

## 2018-11-10 DIAGNOSIS — R9389 Abnormal findings on diagnostic imaging of other specified body structures: Secondary | ICD-10-CM | POA: Diagnosis not present

## 2018-11-10 DIAGNOSIS — R590 Localized enlarged lymph nodes: Secondary | ICD-10-CM

## 2018-11-10 MED ORDER — ZOLPIDEM TARTRATE 10 MG PO TABS: 10.0000 mg | ORAL_TABLET | Freq: Every evening | ORAL | 0 refills | Status: DC | PRN

## 2018-11-10 MED ORDER — VITAMIN D (ERGOCALCIFEROL) 1.25 MG (50000 UNIT) PO CAPS: 50000.0000 [IU] | ORAL_CAPSULE | ORAL | 0 refills | Status: DC

## 2018-11-10 MED ORDER — ROSUVASTATIN CALCIUM 20 MG PO TABS: 20.0000 mg | ORAL_TABLET | Freq: Every day | ORAL | 1 refills | Status: DC

## 2018-11-10 MED ORDER — ALPRAZOLAM ER 1 MG PO TB24: 1.0000 mg | ORAL_TABLET | Freq: Every day | ORAL | 2 refills | Status: DC

## 2018-11-10 MED ORDER — PAROXETINE HCL 20 MG PO TABS: 60.0000 mg | ORAL_TABLET | ORAL | 0 refills | Status: DC

## 2018-11-10 MED ORDER — VASCEPA 1 G PO CAPS: 1.0000 g | ORAL_CAPSULE | Freq: Two times a day (BID) | ORAL | 1 refills | Status: DC

## 2018-11-10 MED ORDER — CYCLOBENZAPRINE HCL 10 MG PO TABS: 10.0000 mg | ORAL_TABLET | Freq: Every day | ORAL | 0 refills | Status: DC
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
## 2018-11-12 ENCOUNTER — Ambulatory Visit: Admission: RE | Admit: 2018-11-12 | Payer: BC Managed Care – PPO | Source: Ambulatory Visit

## 2018-11-12 ENCOUNTER — Ambulatory Visit: Payer: BC Managed Care – PPO | Attending: Family Medicine

## 2018-11-16 ENCOUNTER — Encounter: Payer: Self-pay | Admitting: Family Medicine

## 2018-11-20 ENCOUNTER — Other Ambulatory Visit: Payer: Self-pay

## 2018-11-20 ENCOUNTER — Ambulatory Visit
Admission: RE | Admit: 2018-11-20 | Discharge: 2018-11-20 | Disposition: A | Discharge status: Home / Self Care | Payer: BC Managed Care – PPO | Source: Ambulatory Visit | Attending: Family Medicine | Admitting: Family Medicine

## 2018-11-20 DIAGNOSIS — R590 Localized enlarged lymph nodes: Secondary | ICD-10-CM | POA: Diagnosis not present

## 2018-11-20 DIAGNOSIS — R109 Unspecified abdominal pain: Secondary | ICD-10-CM | POA: Diagnosis not present

## 2018-11-20 DIAGNOSIS — E041 Nontoxic single thyroid nodule: Secondary | ICD-10-CM | POA: Diagnosis not present

## 2018-11-23 DIAGNOSIS — R4189 Other symptoms and signs involving cognitive functions and awareness: Secondary | ICD-10-CM | POA: Diagnosis not present

## 2018-12-01 ENCOUNTER — Other Ambulatory Visit
Admission: RE | Admit: 2018-12-01 | Discharge: 2018-12-01 | Disposition: A | Discharge status: Home / Self Care | Payer: BC Managed Care – PPO | Source: Ambulatory Visit | Attending: Pulmonary Disease | Admitting: Pulmonary Disease

## 2018-12-01 ENCOUNTER — Ambulatory Visit: Payer: BC Managed Care – PPO | Admitting: Pulmonary Disease

## 2018-12-01 ENCOUNTER — Encounter: Payer: Self-pay | Admitting: Pulmonary Disease

## 2018-12-01 ENCOUNTER — Other Ambulatory Visit: Payer: Self-pay

## 2018-12-01 VITALS — BP 112/80 | HR 106 | Temp 97.8°F | Ht 66.0 in | Wt 201.2 lb

## 2018-12-01 DIAGNOSIS — J683 Other acute and subacute respiratory conditions due to chemicals, gases, fumes and vapors: Secondary | ICD-10-CM | POA: Insufficient documentation

## 2018-12-01 DIAGNOSIS — R000 Tachycardia, unspecified: Secondary | ICD-10-CM | POA: Insufficient documentation

## 2018-12-01 DIAGNOSIS — R0600 Dyspnea, unspecified: Secondary | ICD-10-CM | POA: Insufficient documentation

## 2018-12-01 DIAGNOSIS — J383 Other diseases of vocal cords: Secondary | ICD-10-CM

## 2018-12-01 LAB — CBC WITH DIFFERENTIAL/PLATELET
Abs Immature Granulocytes: 0.04 10*3/uL (ref 0.00–0.07)
Basophils Absolute: 0.1 10*3/uL (ref 0.0–0.1)
Basophils Relative: 1 %
Eosinophils Absolute: 0.1 10*3/uL (ref 0.0–0.5)
Eosinophils Relative: 1 %
HCT: 38.1 % (ref 36.0–46.0)
Hemoglobin: 13 g/dL (ref 12.0–15.0)
Immature Granulocytes: 1 %
Lymphocytes Relative: 19 %
Lymphs Abs: 1.5 10*3/uL (ref 0.7–4.0)
MCH: 30.4 pg (ref 26.0–34.0)
MCHC: 34.1 g/dL (ref 30.0–36.0)
MCV: 89 fL (ref 80.0–100.0)
Monocytes Absolute: 0.4 10*3/uL (ref 0.1–1.0)
Monocytes Relative: 5 %
Neutro Abs: 5.7 10*3/uL (ref 1.7–7.7)
Neutrophils Relative %: 73 %
Platelets: 249 10*3/uL (ref 150–400)
RBC: 4.28 MIL/uL (ref 3.87–5.11)
RDW: 12.6 % (ref 11.5–15.5)
WBC: 7.7 10*3/uL (ref 4.0–10.5)
nRBC: 0 % (ref 0.0–0.2)

## 2018-12-01 MED ORDER — ALBUTEROL SULFATE HFA 108 (90 BASE) MCG/ACT IN AERS: 2.0000 | INHALATION_SPRAY | Freq: Four times a day (QID) | RESPIRATORY_TRACT | 2 refills | Status: DC | PRN

## 2018-12-01 NOTE — Progress Notes (Signed)
 Assessment & Plan:  1. Dyspnea, unspecified type (Primary) - ECHOCARDIOGRAM COMPLETE; Future - Pulmonary Function Test [facility] Only; Future  2. Reactive airways dysfunction syndrome (HCC) - CBC w/Diff; Future - Angiotensin converting enzyme - Allergens w/Total IgE Area 2  3. Vocal cord dysfunction  4. Tachycardia   Patient Instructions  1.  We will schedule breathing tests.  2.  We will schedule a heart test to evaluate the pressure of the artery going from the heart to the lungs.  3.  Discontinue use of Symbicort , we will switch you to albuterol  as needed.  Keep track of how often you need to use it.  4.  Continue following antireflux measures.  5.  We will see you in follow-up in 6 to 8 weeks time call sooner should any new difficulties arise  6.  Thank you for trusting us  with your health care issues.  Please note: late entry documentation due to logistical difficulties during COVID-19 pandemic. This note is filed for information purposes only, and is not intended to be used for billing, nor does it represent the full scope/nature of the visit in question. Please see any associated scanned media linked to date of encounter for additional pertinent information.  Subjective:    HPI: Alicia [name] is a 48 y.o. female presenting to the pulmonology clinic on 12/01/2018 with report of: pulmonary consult (per Dr. Glenard- sob with exertion, laying flat, with laughing and wheezing. )     Outpatient Encounter Medications as of 12/01/2018  Medication Sig   UBRELVY  100 MG TABS Take 100 mg by mouth daily as needed (migraine).    [DISCONTINUED] ALPRAZolam  (XANAX  XR) 1 MG 24 hr tablet Take 1 tablet (1 mg total) by mouth daily.   [DISCONTINUED] aspirin  325 MG EC tablet Take 1 tablet (325 mg total) by mouth daily. (Patient not taking: Reported on 05/17/2019)   [DISCONTINUED] budesonide -formoterol  (SYMBICORT ) 80-4.5 MCG/ACT inhaler Inhale 2 puffs into the lungs 2 (two) times  daily. Rinse out mouth and spit out after each use   [DISCONTINUED] carvedilol  (COREG ) 3.125 MG tablet TAKE 1 TABLET(3.125 MG) BY MOUTH TWICE DAILY WITH A MEAL   [DISCONTINUED] Coenzyme Q10 (COQ-10) 200 MG CAPS Take 1 capsule by mouth daily.   [DISCONTINUED] cyclobenzaprine  (FLEXERIL ) 10 MG tablet Take 1 tablet (10 mg total) by mouth at bedtime. TAKE 1 TABLET(10 MG) BY MOUTH DAILY AS NEEDED FOR MUSCLE SPASMS   [DISCONTINUED] Icosapent  Ethyl (VASCEPA ) 1 g CAPS Take 1 capsule (1 g total) by mouth 2 (two) times daily.   [DISCONTINUED] lamoTRIgine  (LAMICTAL ) 25 MG tablet Take 25 mg by mouth daily.    [DISCONTINUED] pantoprazole  (PROTONIX ) 40 MG tablet Take 1 tablet (40 mg total) by mouth daily.   [DISCONTINUED] PARoxetine  (PAXIL ) 20 MG tablet Take 3 tablets (60 mg total) by mouth every morning. (Patient taking differently: Take 40 mg by mouth daily. )   [DISCONTINUED] rosuvastatin  (CRESTOR ) 20 MG tablet Take 1 tablet (20 mg total) by mouth daily.   [DISCONTINUED] Vitamin D , Ergocalciferol , (DRISDOL ) 1.25 MG (50000 UT) CAPS capsule Take 1 capsule (50,000 Units total) by mouth every 7 (seven) days.   [DISCONTINUED] zolpidem  (AMBIEN ) 10 MG tablet Take 1 tablet (10 mg total) by mouth at bedtime as needed for sleep.   [DISCONTINUED] albuterol  (VENTOLIN  HFA) 108 (90 Base) MCG/ACT inhaler Inhale 2 puffs into the lungs every 6 (six) hours as needed for wheezing or shortness of breath. (Patient not taking: Reported on 05/17/2019)   No facility-administered encounter  medications on file as of 12/01/2018.      Objective:   Vitals:   12/01/18 1006  BP: 112/80  Pulse: (!) 106  Temp: 97.8 F (36.6 C)  Height: 5' 6 (1.676 m)  Weight: 201 lb 3.2 oz (91.3 kg)  SpO2: 97%  TempSrc: Temporal  BMI (Calculated): 32.49     Physical exam documentation is limited by delayed entry of information.

## 2018-12-01 NOTE — Patient Instructions (Signed)
1.  We will schedule breathing tests.  2.  We will schedule a heart test to evaluate the pressure of the artery going from the heart to the lungs.  3.  Discontinue use of Symbicort, we will switch you to albuterol as needed.  Keep track of how often you need to use it.  4.  Continue following antireflux measures.  5.  We will see you in follow-up in 6 to 8 weeks time call sooner should any new difficulties arise  6.  Thank you for trusting [place] with your health care issues.

## 2018-12-02 LAB — ANGIOTENSIN CONVERTING ENZYME: Angiotensin-Converting Enzyme: 57 U/L (ref 14–82)

## 2018-12-03 LAB — ALLERGENS W/TOTAL IGE AREA 2
Alternaria Alternata IgE: <0.1 kU/L
Aspergillus Fumigatus IgE: <0.1 kU/L
Bermuda Grass IgE: <0.1 kU/L
Cat Dander IgE: <0.1 kU/L
Cedar, Mountain IgE: <0.1 kU/L
Cladosporium Herbarum IgE: <0.1 kU/L
Cockroach, German IgE: <0.1 kU/L
Common Silver Birch IgE: <0.1 kU/L
Cottonwood IgE: <0.1 kU/L
D Farinae IgE: <0.1 kU/L
D Pteronyssinus IgE: <0.1 kU/L
Dog Dander IgE: <0.1 kU/L
Elm, American IgE: <0.1 kU/L
IgE (Immunoglobulin E), Serum: 3 IU/mL — ABNORMAL LOW (ref 6–495)
Johnson Grass IgE: 0.16 kU/L — AB
Maple/Box Elder IgE: <0.1 kU/L
Mouse Urine IgE: <0.1 kU/L
Oak, White IgE: 0.31 kU/L — AB
Pecan, Hickory IgE: 0.35 kU/L — AB
Penicillium Chrysogen IgE: <0.1 kU/L
Pigweed, Rough IgE: <0.1 kU/L
Ragweed, Short IgE: <0.1 kU/L
Sheep Sorrel IgE Qn: <0.1 kU/L
Timothy Grass IgE: 0.56 kU/L — AB
White Mulberry IgE: <0.1 kU/L

## 2018-12-08 ENCOUNTER — Ambulatory Visit (INDEPENDENT_AMBULATORY_CARE_PROVIDER_SITE_OTHER): Payer: BC Managed Care – PPO | Admitting: *Deleted

## 2018-12-08 DIAGNOSIS — I639 Cerebral infarction, unspecified: Secondary | ICD-10-CM

## 2018-12-09 LAB — CUP PACEART REMOTE DEVICE CHECK
Date Time Interrogation Session: 20201020121301
Implantable Pulse Generator Implant Date: 20181103

## 2018-12-10 ENCOUNTER — Ambulatory Visit (INDEPENDENT_AMBULATORY_CARE_PROVIDER_SITE_OTHER): Payer: BC Managed Care – PPO

## 2018-12-10 ENCOUNTER — Other Ambulatory Visit: Payer: Self-pay

## 2018-12-10 DIAGNOSIS — R0600 Dyspnea, unspecified: Secondary | ICD-10-CM

## 2018-12-11 ENCOUNTER — Other Ambulatory Visit: Payer: Self-pay | Admitting: Family Medicine

## 2018-12-11 DIAGNOSIS — G8929 Other chronic pain: Secondary | ICD-10-CM

## 2018-12-11 DIAGNOSIS — R413 Other amnesia: Secondary | ICD-10-CM | POA: Diagnosis not present

## 2018-12-11 NOTE — Telephone Encounter (Signed)
Requested medication (s) are due for refill today: yes  Requested medication (s) are on the active medication list: yes  Last refill:  11/10/2018  Future visit scheduled: yes  Notes to clinic:  Refill cannot be delegated   Requested Prescriptions  Pending Prescriptions Disp Refills   cyclobenzaprine (FLEXERIL) 10 MG tablet [Pharmacy Med Name: CYCLOBENZAPRINE 10MG  TABLETS] 90 tablet 0    Sig: TAKE 1 TABLET(10 MG) BY MOUTH DAILY AT BEDTIME AS NEEDED FOR MUSCLE SPASMS     Not Delegated - Analgesics:  Muscle Relaxants Failed - 12/11/2018  2:19 PM      Failed - This refill cannot be delegated      Passed - Valid encounter within last 6 months    Recent Outpatient Visits          1 month ago Mild episode of recurrent major depressive disorder [facility])   [facility] Steele Sizer, MD   2 months ago Supraclavicular adenopathy   [place], [place]   4 months ago Obstructive sleep apnea syndrome   [facility] Steele Sizer, MD   7 months ago Seizure-like activity [facility])   [facility] Steele Sizer, MD   10 months ago Chronic neck pain   [place], [place]      Future Appointments            In 1 month Steele Sizer, MD [facility], [facility]

## 2018-12-24 NOTE — Progress Notes (Signed)
[facility] Summary Report / Loop Recorder 

## 2018-12-25 DIAGNOSIS — R413 Other amnesia: Secondary | ICD-10-CM | POA: Diagnosis not present

## 2018-12-25 DIAGNOSIS — R4189 Other symptoms and signs involving cognitive functions and awareness: Secondary | ICD-10-CM | POA: Diagnosis not present

## 2019-01-11 ENCOUNTER — Ambulatory Visit (INDEPENDENT_AMBULATORY_CARE_PROVIDER_SITE_OTHER): Payer: BC Managed Care – PPO | Admitting: *Deleted

## 2019-01-11 DIAGNOSIS — Z8673 Personal history of transient ischemic attack (TIA), and cerebral infarction without residual deficits: Secondary | ICD-10-CM | POA: Diagnosis not present

## 2019-01-11 LAB — CUP PACEART REMOTE DEVICE CHECK
Date Time Interrogation Session: 20201122100511
Implantable Pulse Generator Implant Date: 20181103

## 2019-01-15 ENCOUNTER — Telehealth: Payer: Self-pay | Admitting: Pulmonary Disease

## 2019-01-15 ENCOUNTER — Other Ambulatory Visit: Payer: Self-pay

## 2019-01-15 NOTE — Telephone Encounter (Signed)
Left message to relay date/time of covid test. 01/18/2019 prior to 11:[facility]  

## 2019-01-15 NOTE — Telephone Encounter (Signed)
Pt is aware of date/time of covid test and voiced her understanding.  ° °

## 2019-01-18 ENCOUNTER — Other Ambulatory Visit
Admission: RE | Admit: 2019-01-18 | Discharge: 2019-01-18 | Disposition: A | Discharge status: Home / Self Care | Payer: BC Managed Care – PPO | Source: Ambulatory Visit | Attending: Pulmonary Disease | Admitting: Pulmonary Disease

## 2019-01-18 ENCOUNTER — Other Ambulatory Visit: Payer: Self-pay

## 2019-01-18 DIAGNOSIS — Z20828 Contact with and (suspected) exposure to other viral communicable diseases: Secondary | ICD-10-CM | POA: Insufficient documentation

## 2019-01-18 DIAGNOSIS — Z01812 Encounter for preprocedural laboratory examination: Secondary | ICD-10-CM | POA: Diagnosis not present

## 2019-01-18 LAB — SARS CORONAVIRUS 2 (TAT 6-24 HRS): SARS Coronavirus 2: NEGATIVE

## 2019-01-19 ENCOUNTER — Ambulatory Visit: Payer: BC Managed Care – PPO | Attending: Pulmonary Disease

## 2019-01-19 DIAGNOSIS — R0600 Dyspnea, unspecified: Secondary | ICD-10-CM

## 2019-01-19 MED ORDER — ALBUTEROL SULFATE (2.5 MG/3ML) 0.083% IN NEBU
2.5000 mg | INHALATION_SOLUTION | Freq: Once | RESPIRATORY_TRACT | Status: AC
  Administered 2019-01-19: 2.5 mg via RESPIRATORY_TRACT
  Filled 2019-01-19: qty 3

## 2019-01-22 ENCOUNTER — Ambulatory Visit: Payer: BC Managed Care – PPO | Admitting: Pulmonary Disease

## 2019-01-28 ENCOUNTER — Encounter: Payer: Self-pay | Admitting: Family Medicine

## 2019-01-28 ENCOUNTER — Other Ambulatory Visit: Payer: Self-pay

## 2019-01-28 ENCOUNTER — Ambulatory Visit (INDEPENDENT_AMBULATORY_CARE_PROVIDER_SITE_OTHER): Payer: BC Managed Care – PPO | Admitting: Family Medicine

## 2019-01-28 VITALS — BP 118/75 | HR 110 | Temp 98.4°F | Ht 66.0 in | Wt 200.0 lb

## 2019-01-28 DIAGNOSIS — G4733 Obstructive sleep apnea (adult) (pediatric): Secondary | ICD-10-CM

## 2019-01-28 DIAGNOSIS — G43009 Migraine without aura, not intractable, without status migrainosus: Secondary | ICD-10-CM

## 2019-01-28 DIAGNOSIS — G8929 Other chronic pain: Secondary | ICD-10-CM

## 2019-01-28 DIAGNOSIS — R109 Unspecified abdominal pain: Secondary | ICD-10-CM

## 2019-01-28 DIAGNOSIS — F330 Major depressive disorder, recurrent, mild: Secondary | ICD-10-CM | POA: Diagnosis not present

## 2019-01-28 DIAGNOSIS — R0609 Other forms of dyspnea: Secondary | ICD-10-CM

## 2019-01-28 DIAGNOSIS — K219 Gastro-esophageal reflux disease without esophagitis: Secondary | ICD-10-CM

## 2019-01-28 DIAGNOSIS — R7303 Prediabetes: Secondary | ICD-10-CM

## 2019-01-28 DIAGNOSIS — R590 Localized enlarged lymph nodes: Secondary | ICD-10-CM

## 2019-01-28 DIAGNOSIS — E785 Hyperlipidemia, unspecified: Secondary | ICD-10-CM | POA: Diagnosis not present

## 2019-01-28 DIAGNOSIS — M542 Cervicalgia: Secondary | ICD-10-CM

## 2019-01-28 DIAGNOSIS — F5104 Psychophysiologic insomnia: Secondary | ICD-10-CM

## 2019-01-28 DIAGNOSIS — R000 Tachycardia, unspecified: Secondary | ICD-10-CM

## 2019-01-28 DIAGNOSIS — R0600 Dyspnea, unspecified: Secondary | ICD-10-CM

## 2019-01-28 DIAGNOSIS — F411 Generalized anxiety disorder: Secondary | ICD-10-CM

## 2019-01-28 MED ORDER — ZOLPIDEM TARTRATE 10 MG PO TABS: 10.0000 mg | ORAL_TABLET | Freq: Every evening | ORAL | 0 refills | Status: DC | PRN

## 2019-01-28 MED ORDER — PANTOPRAZOLE SODIUM 40 MG PO TBEC: 40.0000 mg | DELAYED_RELEASE_TABLET | Freq: Every day | ORAL | 1 refills | Status: DC

## 2019-01-28 MED ORDER — VASCEPA 1 G PO CAPS: 1.0000 g | ORAL_CAPSULE | Freq: Two times a day (BID) | ORAL | 1 refills | Status: DC

## 2019-01-28 MED ORDER — CARVEDILOL 3.125 MG PO TABS: ORAL_TABLET | ORAL | 1 refills | Status: DC

## 2019-01-28 MED ORDER — ALPRAZOLAM ER 1 MG PO TB24: 1.0000 mg | ORAL_TABLET | Freq: Every day | ORAL | 2 refills | Status: DC

## 2019-01-28 MED ORDER — CYCLOBENZAPRINE HCL 10 MG PO TABS: 10.0000 mg | ORAL_TABLET | Freq: Every day | ORAL | 0 refills | Status: DC
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
## 2019-01-30 ENCOUNTER — Other Ambulatory Visit: Payer: Self-pay | Admitting: Family Medicine

## 2019-01-31 NOTE — Telephone Encounter (Signed)
Requested medication (s) are due for refill today: yes  Requested medication (s) are on the active medication list: yes  Last refill:  11/10/2018  Future visit scheduled: yes  Notes to clinic:  refill cannot be delegated    Requested Prescriptions  Pending Prescriptions Disp Refills   Vitamin D, Ergocalciferol, (DRISDOL) 1.25 MG (50000 UT) CAPS capsule [Pharmacy Med Name: VITAMIN D2 50,000IU (ERGO) CAP RX] 12 capsule 0    Sig: TAKE 1 CAPSULE BY MOUTH EVERY 7 DAYS      Endocrinology:  Vitamins - Vitamin D Supplementation Failed - 01/30/2019  5:09 PM      Failed - 50,000 IU strengths are not delegated      Failed - Phosphate in normal range and within 360 days    Phosphorus  Date Value Ref Range Status  04/26/2016 3.4 2.5 - 4.5 mg/dL Final          Passed - Ca in normal range and within 360 days    Calcium  Date Value Ref Range Status  09/30/2018 9.3 8.6 - 10.2 mg/dL Final   Calcium, Ion  Date Value Ref Range Status  01/20/2016 1.10 (L) 1.15 - 1.40 mmol/L Final          Passed - Vitamin D in normal range and within 360 days    Vit D, 25-Hydroxy  Date Value Ref Range Status  02/09/2018 32 30 - 100 ng/mL Final    Comment:    Vitamin D Status         25-OH Vitamin D: . Deficiency:                    <20 ng/mL Insufficiency:             20 - 29 ng/mL Optimal:                 > or = 30 ng/mL . For 25-OH Vitamin D testing on patients on  D2-supplementation and patients for whom quantitation  of D2 and D3 fractions is required, the QuestAssureD(TM) 25-OH VIT D, (D2,D3), LC/MS/MS is recommended: order  code [id number] (patients >65yrs). . For more information on this test, go to: http://education.questdiagnostics.com/faq/FAQ163 (This link is being provided for  informational/educational purposes only.)           Passed - Valid encounter within last 12 months    Recent Outpatient Visits           3 days ago Mild episode of recurrent major depressive disorder [facility])   [facility], Drue Stager, MD   2 months ago Mild episode of recurrent major depressive disorder [facility])   [facility] Steele Sizer, MD   4 months ago Supraclavicular adenopathy   [place], NP   5 months ago Obstructive sleep apnea syndrome   [facility] Steele Sizer, MD   8 months ago Seizure-like activity [facility])   [facility] Steele Sizer, MD       Future Appointments             In 2 months Ancil Boozer, Drue Stager, MD [facility], [facility]

## 2019-02-11 ENCOUNTER — Ambulatory Visit (INDEPENDENT_AMBULATORY_CARE_PROVIDER_SITE_OTHER): Payer: BC Managed Care – PPO | Admitting: *Deleted

## 2019-02-11 DIAGNOSIS — Z8673 Personal history of transient ischemic attack (TIA), and cerebral infarction without residual deficits: Secondary | ICD-10-CM

## 2019-02-11 LAB — CUP PACEART REMOTE DEVICE CHECK
Date Time Interrogation Session: 20201223230835
Implantable Pulse Generator Implant Date: 20181103

## 2019-02-14 NOTE — Progress Notes (Signed)
ILR remote 

## 2019-03-11 ENCOUNTER — Other Ambulatory Visit: Payer: Self-pay | Admitting: Family Medicine

## 2019-03-11 DIAGNOSIS — F411 Generalized anxiety disorder: Secondary | ICD-10-CM

## 2019-03-15 ENCOUNTER — Ambulatory Visit (INDEPENDENT_AMBULATORY_CARE_PROVIDER_SITE_OTHER): Payer: BC Managed Care – PPO | Admitting: *Deleted

## 2019-03-15 DIAGNOSIS — Z8673 Personal history of transient ischemic attack (TIA), and cerebral infarction without residual deficits: Secondary | ICD-10-CM

## 2019-03-15 LAB — CUP PACEART REMOTE DEVICE CHECK
Date Time Interrogation Session: 20210124232751
Implantable Pulse Generator Implant Date: 20181103

## 2019-03-22 ENCOUNTER — Encounter: Payer: Self-pay | Admitting: Family Medicine

## 2019-03-26 ENCOUNTER — Encounter: Payer: Self-pay | Admitting: Family Medicine

## 2019-04-15 ENCOUNTER — Ambulatory Visit (INDEPENDENT_AMBULATORY_CARE_PROVIDER_SITE_OTHER): Payer: BC Managed Care – PPO | Admitting: *Deleted

## 2019-04-15 DIAGNOSIS — Z8673 Personal history of transient ischemic attack (TIA), and cerebral infarction without residual deficits: Secondary | ICD-10-CM

## 2019-04-15 LAB — CUP PACEART REMOTE DEVICE CHECK
Date Time Interrogation Session: 20210225000244
Implantable Pulse Generator Implant Date: 20181103

## 2019-04-15 NOTE — Progress Notes (Signed)
ILR Remote 

## 2019-04-24 ENCOUNTER — Other Ambulatory Visit: Payer: Self-pay | Admitting: Family Medicine

## 2019-04-24 NOTE — Telephone Encounter (Signed)
Requested medication (s) are due for refill today: yes  Requested medication (s) are on the active medication list: yes  Last refill:  02/02/19  Future visit scheduled: yes  Notes to clinic:  50,000 IU strength not delegated to NT to refill   Requested Prescriptions  Pending Prescriptions Disp Refills   Vitamin D, Ergocalciferol, (DRISDOL) 1.25 MG (50000 UNIT) CAPS capsule [Pharmacy Med Name: VITAMIN D2 50,000IU (ERGO) CAP RX] 12 capsule 0    Sig: TAKE 1 CAPSULE BY MOUTH EVERY 7 DAYS      Endocrinology:  Vitamins - Vitamin D Supplementation Failed - 04/24/2019 10:48 AM      Failed - 50,000 IU strengths are not delegated      Failed - Phosphate in normal range and within 360 days    Phosphorus  Date Value Ref Range Status  04/26/2016 3.4 2.5 - 4.5 mg/dL Final          Failed - Vitamin D in normal range and within 360 days    Vit D, 25-Hydroxy  Date Value Ref Range Status  02/09/2018 32 30 - 100 ng/mL Final    Comment:    Vitamin D Status         25-OH Vitamin D: . Deficiency:                    <20 ng/mL Insufficiency:             20 - 29 ng/mL Optimal:                 > or = 30 ng/mL . For 25-OH Vitamin D testing on patients on  D2-supplementation and patients for whom quantitation  of D2 and D3 fractions is required, the QuestAssureD(TM) 25-OH VIT D, (D2,D3), LC/MS/MS is recommended: order  code 19379 (patients >4yrs). . For more information on this test, go to: http://education.questdiagnostics.com/faq/FAQ163 (This link is being provided for  informational/educational purposes only.)           Passed - Ca in normal range and within 360 days    Calcium  Date Value Ref Range Status  09/30/2018 9.3 8.6 - 10.2 mg/dL Final   Calcium, Ion  Date Value Ref Range Status  01/20/2016 1.10 (L) 1.15 - 1.40 mmol/L Final          Passed - Valid encounter within last 12 months    Recent Outpatient Visits           2 months ago Mild episode of recurrent major  depressive disorder [facility])   [facility] [place], Danna Hefty, MD   5 months ago Mild episode of recurrent major depressive disorder [facility])   [facility] Alba Cory, MD   6 months ago Supraclavicular adenopathy   [facility] Sharyon Cable E, NP   8 months ago Obstructive sleep apnea syndrome   [facility] Alba Cory, MD   11 months ago Seizure-like activity [facility])   [facility] Alba Cory, MD       Future Appointments             In 4 days Alba Cory, MD [facility], [facility]

## 2019-04-28 ENCOUNTER — Ambulatory Visit (INDEPENDENT_AMBULATORY_CARE_PROVIDER_SITE_OTHER): Payer: BC Managed Care – PPO | Admitting: Family Medicine

## 2019-04-28 ENCOUNTER — Encounter: Payer: Self-pay | Admitting: Family Medicine

## 2019-04-28 ENCOUNTER — Telehealth: Payer: Self-pay | Admitting: Family Medicine

## 2019-04-28 DIAGNOSIS — R109 Unspecified abdominal pain: Secondary | ICD-10-CM

## 2019-04-28 DIAGNOSIS — G894 Chronic pain syndrome: Secondary | ICD-10-CM

## 2019-04-28 DIAGNOSIS — R1310 Dysphagia, unspecified: Secondary | ICD-10-CM | POA: Diagnosis not present

## 2019-04-28 DIAGNOSIS — G43009 Migraine without aura, not intractable, without status migrainosus: Secondary | ICD-10-CM

## 2019-04-28 DIAGNOSIS — E538 Deficiency of other specified B group vitamins: Secondary | ICD-10-CM

## 2019-04-28 DIAGNOSIS — F330 Major depressive disorder, recurrent, mild: Secondary | ICD-10-CM

## 2019-04-28 DIAGNOSIS — R1319 Other dysphagia: Secondary | ICD-10-CM

## 2019-04-28 DIAGNOSIS — Z79899 Other long term (current) drug therapy: Secondary | ICD-10-CM

## 2019-04-28 DIAGNOSIS — F411 Generalized anxiety disorder: Secondary | ICD-10-CM

## 2019-04-28 DIAGNOSIS — R7303 Prediabetes: Secondary | ICD-10-CM

## 2019-04-28 DIAGNOSIS — G4733 Obstructive sleep apnea (adult) (pediatric): Secondary | ICD-10-CM

## 2019-04-28 DIAGNOSIS — E785 Hyperlipidemia, unspecified: Secondary | ICD-10-CM | POA: Diagnosis not present

## 2019-04-28 DIAGNOSIS — M542 Cervicalgia: Secondary | ICD-10-CM

## 2019-04-28 DIAGNOSIS — F5104 Psychophysiologic insomnia: Secondary | ICD-10-CM

## 2019-04-28 DIAGNOSIS — I776 Arteritis, unspecified: Secondary | ICD-10-CM

## 2019-04-28 DIAGNOSIS — K219 Gastro-esophageal reflux disease without esophagitis: Secondary | ICD-10-CM

## 2019-04-28 DIAGNOSIS — G8929 Other chronic pain: Secondary | ICD-10-CM

## 2019-04-28 DIAGNOSIS — R569 Unspecified convulsions: Secondary | ICD-10-CM

## 2019-04-28 DIAGNOSIS — E559 Vitamin D deficiency, unspecified: Secondary | ICD-10-CM

## 2019-04-28 MED ORDER — ZOLPIDEM TARTRATE 10 MG PO TABS: 10.0000 mg | ORAL_TABLET | Freq: Every evening | ORAL | 0 refills | Status: DC | PRN

## 2019-04-28 MED ORDER — ROSUVASTATIN CALCIUM 20 MG PO TABS: 20.0000 mg | ORAL_TABLET | Freq: Every day | ORAL | 1 refills | Status: DC

## 2019-04-28 MED ORDER — ALPRAZOLAM ER 1 MG PO TB24: 1.0000 mg | ORAL_TABLET | Freq: Every day | ORAL | 2 refills | Status: DC

## 2019-04-28 MED ORDER — CYCLOBENZAPRINE HCL 10 MG PO TABS: 10.0000 mg | ORAL_TABLET | Freq: Every day | ORAL | 0 refills | Status: DC
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
## 2019-04-28 NOTE — Telephone Encounter (Signed)
Called outpatient imaging to get patient scheduled for a CT of abdomen & pelvis. Patient is scheduled for 05/05/19 @ 4:30pm at the [facility] on [address]  I also called patient to inform of her appointment and to arrive there at 4:15pm for check in, pick up oral contrast the day before from imaging center, and not to eat 4 hours prior to appt.  Patient verbalized understanding.

## 2019-05-04 DIAGNOSIS — E538 Deficiency of other specified B group vitamins: Secondary | ICD-10-CM | POA: Diagnosis not present

## 2019-05-04 DIAGNOSIS — Z79899 Other long term (current) drug therapy: Secondary | ICD-10-CM | POA: Diagnosis not present

## 2019-05-04 DIAGNOSIS — R7303 Prediabetes: Secondary | ICD-10-CM | POA: Diagnosis not present

## 2019-05-04 DIAGNOSIS — E785 Hyperlipidemia, unspecified: Secondary | ICD-10-CM | POA: Diagnosis not present

## 2019-05-05 ENCOUNTER — Other Ambulatory Visit: Payer: Self-pay

## 2019-05-05 ENCOUNTER — Ambulatory Visit
Admission: RE | Admit: 2019-05-05 | Discharge: 2019-05-05 | Disposition: A | Discharge status: Home / Self Care | Payer: BC Managed Care – PPO | Source: Ambulatory Visit | Attending: Family Medicine | Admitting: Family Medicine

## 2019-05-05 DIAGNOSIS — R109 Unspecified abdominal pain: Secondary | ICD-10-CM | POA: Insufficient documentation

## 2019-05-05 DIAGNOSIS — K8020 Calculus of gallbladder without cholecystitis without obstruction: Secondary | ICD-10-CM | POA: Diagnosis not present

## 2019-05-05 DIAGNOSIS — N200 Calculus of kidney: Secondary | ICD-10-CM | POA: Diagnosis not present

## 2019-05-05 LAB — CBC WITH DIFFERENTIAL/PLATELET
Absolute Monocytes: 319 cells/uL (ref 200–950)
Basophils Absolute: 29 cells/uL (ref 0–200)
Basophils Relative: 0.5 %
Eosinophils Absolute: 70 cells/uL (ref 15–500)
Eosinophils Relative: 1.2 %
HCT: 39.2 % (ref 35.0–45.0)
Hemoglobin: 13.2 g/dL (ref 11.7–15.5)
Lymphs Abs: 1543 cells/uL (ref 850–3900)
MCH: 30.6 pg (ref 27.0–33.0)
MCHC: 33.7 g/dL (ref 32.0–36.0)
MCV: 91 fL (ref 80.0–100.0)
MPV: 10.1 fL (ref 7.5–12.5)
Monocytes Relative: 5.5 %
Neutro Abs: 3840 cells/uL (ref 1500–7800)
Neutrophils Relative %: 66.2 %
Platelets: 258 10*3/uL (ref 140–400)
RBC: 4.31 10*6/uL (ref 3.80–5.10)
RDW: 13.1 % (ref 11.0–15.0)
Total Lymphocyte: 26.6 %
WBC: 5.8 10*3/uL (ref 3.8–10.8)

## 2019-05-05 LAB — COMPLETE METABOLIC PANEL WITH GFR
AG Ratio: 1.7 (calc) (ref 1.0–2.5)
ALT: 15 U/L (ref 6–29)
AST: 14 U/L (ref 10–35)
Albumin: 3.9 g/dL (ref 3.6–5.1)
Alkaline phosphatase (APISO): 61 U/L (ref 31–125)
BUN: 21 mg/dL (ref 7–25)
CO2: 29 mmol/L (ref 20–32)
Calcium: 9 mg/dL (ref 8.6–10.2)
Chloride: 105 mmol/L (ref 98–110)
Creat: 0.97 mg/dL (ref 0.50–1.10)
GFR, Est African American: 80 mL/min/{1.73_m2} (ref >=60)
GFR, Est Non African American: 69 mL/min/{1.73_m2} (ref >=60)
Globulin: 2.3 g/dL (calc) (ref 1.9–3.7)
Glucose, Bld: 115 mg/dL — ABNORMAL HIGH (ref 65–99)
Potassium: 4.2 mmol/L (ref 3.5–5.3)
Sodium: 141 mmol/L (ref 135–146)
Total Bilirubin: 0.4 mg/dL (ref 0.2–1.2)
Total Protein: 6.2 g/dL (ref 6.1–8.1)

## 2019-05-05 LAB — HEMOGLOBIN A1C
Hgb A1c MFr Bld: 5.7 % of total Hgb — ABNORMAL HIGH (ref <5.7)
Mean Plasma Glucose: 117 (calc)
eAG (mmol/L): 6.5 (calc)

## 2019-05-05 LAB — LIPID PANEL
Cholesterol: 193 mg/dL (ref <200)
HDL: 34 mg/dL — ABNORMAL LOW (ref >=50)
LDL Cholesterol (Calc): 118 mg/dL (calc) — ABNORMAL HIGH
Non-HDL Cholesterol (Calc): 159 mg/dL (calc) — ABNORMAL HIGH (ref <130)
Total CHOL/HDL Ratio: 5.7 (calc) — ABNORMAL HIGH (ref <5.0)
Triglycerides: 305 mg/dL — ABNORMAL HIGH (ref <150)

## 2019-05-05 LAB — VITAMIN B12: Vitamin B-12: 347 pg/mL (ref 200–1100)

## 2019-05-05 LAB — VITAMIN D 25 HYDROXY (VIT D DEFICIENCY, FRACTURES): Vit D, 25-Hydroxy: 70 ng/mL (ref 30–100)

## 2019-05-06 ENCOUNTER — Other Ambulatory Visit: Payer: Self-pay | Admitting: Family Medicine

## 2019-05-06 ENCOUNTER — Encounter: Payer: Self-pay | Admitting: Family Medicine

## 2019-05-06 DIAGNOSIS — K8020 Calculus of gallbladder without cholecystitis without obstruction: Secondary | ICD-10-CM | POA: Insufficient documentation

## 2019-05-06 DIAGNOSIS — K862 Cyst of pancreas: Secondary | ICD-10-CM | POA: Insufficient documentation

## 2019-05-06 DIAGNOSIS — N200 Calculus of kidney: Secondary | ICD-10-CM | POA: Insufficient documentation

## 2019-05-10 ENCOUNTER — Other Ambulatory Visit: Payer: Self-pay

## 2019-05-10 DIAGNOSIS — K8020 Calculus of gallbladder without cholecystitis without obstruction: Secondary | ICD-10-CM | POA: Diagnosis not present

## 2019-05-10 DIAGNOSIS — R4702 Dysphasia: Secondary | ICD-10-CM | POA: Diagnosis not present

## 2019-05-10 DIAGNOSIS — N200 Calculus of kidney: Secondary | ICD-10-CM

## 2019-05-10 DIAGNOSIS — N8110 Cystocele, unspecified: Secondary | ICD-10-CM

## 2019-05-11 ENCOUNTER — Other Ambulatory Visit: Payer: Self-pay | Admitting: General Surgery

## 2019-05-11 DIAGNOSIS — R1310 Dysphagia, unspecified: Secondary | ICD-10-CM

## 2019-05-12 ENCOUNTER — Other Ambulatory Visit: Payer: Self-pay | Admitting: General Surgery

## 2019-05-16 LAB — CUP PACEART REMOTE DEVICE CHECK
Date Time Interrogation Session: 20210328023202
Implantable Pulse Generator Implant Date: 20181103

## 2019-05-17 ENCOUNTER — Ambulatory Visit (INDEPENDENT_AMBULATORY_CARE_PROVIDER_SITE_OTHER): Payer: BC Managed Care – PPO | Admitting: *Deleted

## 2019-05-17 DIAGNOSIS — Z8673 Personal history of transient ischemic attack (TIA), and cerebral infarction without residual deficits: Secondary | ICD-10-CM | POA: Diagnosis not present

## 2019-05-17 NOTE — Progress Notes (Signed)
ILR Remote 

## 2019-05-18 ENCOUNTER — Ambulatory Visit
Admission: RE | Admit: 2019-05-18 | Discharge: 2019-05-18 | Disposition: A | Discharge status: Home / Self Care | Payer: BC Managed Care – PPO | Source: Ambulatory Visit | Attending: Family Medicine | Admitting: Family Medicine

## 2019-05-18 DIAGNOSIS — K862 Cyst of pancreas: Secondary | ICD-10-CM | POA: Insufficient documentation

## 2019-05-18 DIAGNOSIS — K760 Fatty (change of) liver, not elsewhere classified: Secondary | ICD-10-CM | POA: Diagnosis not present

## 2019-05-18 DIAGNOSIS — K869 Disease of pancreas, unspecified: Secondary | ICD-10-CM | POA: Diagnosis not present

## 2019-05-18 MED ORDER — GADOBUTROL 1 MMOL/ML IV SOLN
9.0000 mL | Freq: Once | INTRAVENOUS | Status: AC | PRN
  Administered 2019-05-18: 9 mL via INTRAVENOUS

## 2019-05-19 ENCOUNTER — Other Ambulatory Visit: Payer: Self-pay

## 2019-05-19 ENCOUNTER — Other Ambulatory Visit: Payer: Self-pay | Admitting: General Surgery

## 2019-05-19 ENCOUNTER — Ambulatory Visit
Admission: RE | Admit: 2019-05-19 | Discharge: 2019-05-19 | Disposition: A | Discharge status: Home / Self Care | Payer: BC Managed Care – PPO | Source: Ambulatory Visit | Attending: General Surgery | Admitting: General Surgery

## 2019-05-19 DIAGNOSIS — R1310 Dysphagia, unspecified: Secondary | ICD-10-CM | POA: Diagnosis not present

## 2019-05-19 DIAGNOSIS — K219 Gastro-esophageal reflux disease without esophagitis: Secondary | ICD-10-CM | POA: Diagnosis not present

## 2019-05-24 ENCOUNTER — Other Ambulatory Visit: Payer: Self-pay

## 2019-05-24 ENCOUNTER — Encounter
Admission: RE | Admit: 2019-05-24 | Discharge: 2019-05-24 | Disposition: A | Discharge status: Home / Self Care | Payer: BC Managed Care – PPO | Source: Ambulatory Visit | Attending: General Surgery | Admitting: General Surgery

## 2019-05-24 DIAGNOSIS — Z01818 Encounter for other preprocedural examination: Secondary | ICD-10-CM | POA: Diagnosis not present

## 2019-05-24 HISTORY — DX: Essential (primary) hypertension: I10

## 2019-05-24 HISTORY — DX: Dyspnea, unspecified: R06.00

## 2019-05-24 HISTORY — DX: Sleep apnea, unspecified: G47.30

## 2019-05-24 NOTE — Pre-Procedure Instructions (Signed)
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  Sex: female  MyChart Results Release  MyChart Status: Active Results Release  Vitals  BP Height Weight BSA (Calculated - sq m)  112/80 5\' 6"  (1.676 m) 91.3 kg 2.06 sq meters  Order-Level Documents:  Scan on 12/11/2018 6:29 PM by Default, Provider, MD     Study Result  ECHOCARDIOGRAM REPORT       Patient Name:  Alicia [name] NOTE Gasior Date of Exam: 12/10/2018  Medical Rec #: [id number]       Height:    66.0 in  Accession #:  [id number]       Weight:    201.2 lb  Date of Birth: 11/24/1970       BSA:     2.00 m  Patient Age:  89 years        BP:      122/78 mmHg  Patient Gender: F           HR:      90 bpm.  Exam Location: [facility]   Procedure: 2D Echo, Cardiac Doppler and Color Doppler   Indications:  R06.02 SOB; R07.89 Other chest pain    History:    Patient has prior history of Echocardiogram examinations,  most         recent 12/19/2016. Stroke Signs/Symptoms:Shortness of  Breath and         Chest Pain Risk Factors:Sleep Apnea, Non-Smoker and         Dyslipidemia.    Sonographer:  Pilar Jarvis RDMS, RVT, RDCS  Referring Phys: [id number] CARMEN L GONZALEZ   IMPRESSIONS    1. Left ventricular ejection fraction, by visual estimation, is 60 to  65%. The left ventricle has normal function. Normal left ventricular size.  There is  no left ventricular hypertrophy.  2. Left ventricular diastolic Doppler parameters are consistent with  pseudonormalization pattern of LV diastolic filling.  3. Global right ventricle has normal systolic function.The right  ventricular size is normal. No increase in right ventricular wall  thickness.  4. Left atrial size was normal.  5. TR signal is inadequate for assessing pulmonary artery systolic  pressure.   FINDINGS  Left Ventricle: Left ventricular ejection fraction, by visual estimation,  is 60 to 65%. The left ventricle has normal function. There is no left  ventricular hypertrophy. Normal left ventricular size. Spectral Doppler  shows Left ventricular diastolic  Doppler parameters are consistent with pseudonormalization pattern of LV  diastolic filling.   Right Ventricle: The right ventricular size is normal. No increase in  right ventricular wall thickness. Global RV systolic function is has  normal systolic function.   Left Atrium: Left atrial size was normal in size.   Right Atrium: Right atrial size was normal in size   Pericardium: There is no evidence of pericardial effusion.   Mitral Valve: The mitral valve is normal in structure. No evidence of  mitral valve stenosis by observation. Mild mitral valve regurgitation.   Tricuspid Valve: The tricuspid valve is normal in structure. Tricuspid  valve regurgitation was not visualized by color flow Doppler.  Aortic Valve: The aortic valve is grossly normal. Aortic valve  regurgitation was not visualized by color flow Doppler. The aortic valve  is structurally normal, with no evidence of sclerosis or stenosis. Aortic  valve mean gradient measures 3.0 mmHg.  Aortic valve peak gradient measures 5.0 mmHg. Aortic valve area, by VTI  measures 2.48 cm.   Pulmonic Valve: The pulmonic valve was normal in structure. Pulmonic valve  regurgitation is not visualized by color flow Doppler.   Aorta: The aortic root,  ascending aorta and aortic arch are all  structurally normal, with no evidence of dilitation or obstruction.   Venous: The inferior vena cava is normal in size with greater than 50%  respiratory variability, suggesting right atrial pressure of 3 mmHg.   IAS/Shunts: No atrial level shunt detected by color flow Doppler. No  ventricular septal defect is seen or detected. There is no evidence of an  atrial septal defect.      LEFT VENTRICLE  PLAX 2D  LVIDd:     4.20 cm    Diastology  LVIDs:     2.60 cm    LV e' lateral:  10.40 cm/s  LV PW:     0.70 cm    LV E/e' lateral: 7.4  LV IVS:    0.80 cm    LV e' medial:  6.74 cm/s  LVOT diam:   1.90 cm    LV E/e' medial: 11.4  LV SV:     54 ml  LV SV Index:  25.78  LVOT Area:   2.84 cm    LV Volumes (MOD)  LV area d, A2C:  24.30 cm  LV area d, A4C:  26.10 cm  LV area s, A2C:  14.80 cm  LV area s, A4C:  13.60 cm  LV major d, A2C:  7.67 cm  LV major d, A4C:  8.14 cm  LV major s, A2C:  6.72 cm  LV major s, A4C:  6.42 cm  LV vol d, MOD A2C: 64.8 ml  LV vol d, MOD A4C: 68.9 ml  LV vol s, MOD A2C: 27.3 ml  LV vol s, MOD A4C: 24.1 ml  LV SV MOD A2C:   37.5 ml  LV SV MOD A4C:   68.9 ml  LV SV MOD BP:   42.4 ml   RIGHT VENTRICLE       IVC  RV S prime:   11.20 cm/s IVC diam: 1.50 cm  TAPSE (M-mode): 2.3 cm   LEFT ATRIUM      Index    RIGHT ATRIUM      Index  LA diam:   3.00 cm 1.50 cm/m RA Area:   10.70 cm  LA Vol (A2C): 45.0 ml 22.45 ml/m RA Volume:  19.70 ml 9.83 ml/m  AORTIC VALVE          PULMONIC VALVE  AV Area (Vmax):  2.53 cm  PV Vmax:    0.66 m/s  AV Area (Vmean):  2.33 cm  PV Peak grad: 1.8 mmHg  AV Area (VTI):   2.48 cm  AV Vmax:      112.00 cm/s  AV Vmean:     80.100 cm/s  AV VTI:      0.206 m  AV Peak Grad:   5.0 mmHg  AV Mean Grad:   3.0 mmHg  LVOT Vmax:     99.80  cm/s  LVOT Vmean:    65.800 cm/s  LVOT VTI:     0.180 m  LVOT/AV  VTI ratio: 0.87    AORTA  Ao Root diam: 3.10 cm  Ao Asc diam: 2.70 cm  Ao Arch diam: 2.6 cm   MITRAL VALVE  MV Area (PHT): 5.54 cm       SHUNTS  MV PHT:    39.73 msec      Systemic VTI: 0.18 m  MV Decel Time: 137 msec       Systemic Diam: 1.90 cm  MV E velocity: 76.50 cm/s 103 cm/s  MV A velocity: 60.30 cm/s 70.3 cm/s  MV E/A ratio: 1.27    1.5     Julien Nordmann MD  Electronically signed by Julien Nordmann MD  Signature Date/Time: 12/11/2018/6:27:53 PM      Final   Imaging Info  ECHOCARDIOGRAM COMPLETE (Order #[id number]) on 12/10/18  Study History  ECHOCARDIOGRAM COMPLETE (Order #[id number]) on 12/10/18  [employer] Images  Show images for ECHOCARDIOGRAM COMPLETE  Images on [employer]  Show images for Alicia [name] Note  Performing Technologist/Nurse  Performing Technologist/Nurse: Quentin Ore T  Reason for Exam Priority: Routine Dx: Dyspnea, unspecified type [R06.00 (ICD-10-CM)]                       Surgical History  Surgical History  No past medical history on file.    Other Surgical History  Procedure Laterality Date Comment Source  ABDOMINOPLASTY  Feb. 2015    CESAREAN SECTION  2011    COLONOSCOPY WITH PROPOFOL N/A 11/26/2016 Procedure: COLONOSCOPY WITH PROPOFOL; Surgeon: Wyline Mood, MD; Location: [facility] ENDOSCOPY; Service: Gastroenterology; Laterality: N/A;   CYSTOSCOPY  02/02/2015 Procedure: CYSTOSCOPY; Surgeon: Nadara Mustard, MD; Location: [facility] ORS; Service: Gynecology;;   DILATION AND CURETTAGE OF UTERUS  2003, 2005, 2008    ESOPHAGOGASTRODUODENOSCOPY (EGD) WITH PROPOFOL N/A 11/26/2016 Procedure: ESOPHAGOGASTRODUODENOSCOPY (EGD) WITH PROPOFOL; Surgeon: Wyline Mood, MD; Location: [facility] ENDOSCOPY; Service: Gastroenterology; Laterality: N/A;   HERNIA REPAIR  1999 Umbilical   KNEE ARTHROSCOPY Right 2006    LAPAROSCOPIC  BILATERAL SALPINGECTOMY Bilateral 02/02/2015 Procedure: LAPAROSCOPIC BILATERAL SALPINGECTOMY; Surgeon: Nadara Mustard, MD; Location: [facility] ORS; Service: Gynecology; Laterality: Bilateral;   LAPAROSCOPIC HYSTERECTOMY N/A 02/02/2015 Procedure: HYSTERECTOMY TOTAL LAPAROSCOPIC; Surgeon: Nadara Mustard, MD; Location: [facility] ORS; Service: Gynecology; Laterality: N/A;   LOOP RECORDER INSERTION N/A 12/20/2016 Procedure: LOOP RECORDER INSERTION; Surgeon: Regan Lemming, MD; Location: [facility] INVASIVE CV LAB; Service: Cardiovascular; Laterality: N/A;   REDUCTION MAMMAPLASTY Bilateral December 2015    TEE WITHOUT CARDIOVERSION N/A 12/20/2016 Procedure: TRANSESOPHAGEAL ECHOCARDIOGRAM (TEE); Surgeon: Lars Masson, MD; Location: [facility] ENDOSCOPY; Service: Cardiovascular; Laterality: N/A;     Implants   Prosthesis/Implant Heart  Loop Reveal Linqsys - [id number] - Implanted  Inventory item: LOOP REVEAL LINQSYS Model/Cat number: LINQSYS  Serial number: [id number] Manufacturer: [employer]  As of 12/20/2016  Status: Implanted      Implant  Merm Lnq11 Reveal Linq [id number] - Implanted  Model/Cat number: [id number] REVEAL LINQ Serial number: [id number]  Manufacturer: [employer]    As of 05/16/2019  Status: Implanted      Encounter-Level Documents - 12/10/2018:  Electronic signature on 12/10/2018 3:59 PM - E-signed     Order-Level Documents - 12/10/2018:  Scan on 12/11/2018 6:29 PM by Default, Provider, MD     Resulted by:  Signed Date/Time  Phone Pager  Antonieta Iba 12/11/2018 6:27 PM [phone]   External Result Report  External Result Report

## 2019-05-24 NOTE — Pre-Procedure Instructions (Signed)
Patient Instructions - documented in this encounter Patient Instructions Alicia ProvostShah, Hemang Kalpeshkumar, MD - 10/21/2018 1:00 PM EDT  Dear [name], It was my pleasure to participate in your care in person. My scribe has typed up this brief summary of what we discussed.  1. 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  2. Spells - with associated photophobia, phonophobia, nausea. Patient has difficulty moving and walking without assistance - cannot rule out component of migraine but need to rule out other causes such as seizures.   - We will order MRI brain with and without contrast   - We will order MRA head without contrast   - We will order routine EEG Instructions for EEG (Electroencephalography) - Please have clean dry hair. Better if no products used before test. - If you were advised to be sleep deprived, try to stay up as much as you can for 24 hour period. - Routine EEG - you will be here for around 2 hours. Tech takes about 45-55 minutes to get you ready and then record for 45 minutes. - Once EEG is hooked up you will be allowed to recline in chair ( be comfortable.) - Lights will be dimmed. - You will be asked to open and close your eyes 2 times. - You will be asked some simple questions while you keep your eyes closed. - We turn on a flashing strobe light that flashes and then goes off for about 1:30 minutes.  - We will ask you to simulate hyperventilation by taking short deep breaths for about 3 minutes if you can tolerate and if there are no medical conditions that will  prevent this. - After that you will sit quietly in the room for about 30 minutes. We would like for to take a nap if you can. - Afterwards we will clean you up as much as we can for your trip home. It is suggested that you bring, hat, scarf, poney tail holder. Etc. As we may not get all the paste that hold the EEGs in place out of your hair. If your EEG is scheduled over the weekend, please check in through [facility] Walk in entrance (next to [facility] entrance.) If you are coming on weekend you can reach our on call technician at [name] [phone] (emergency only) [name] [phone] (emergency only)  - Start Lamotrigine 25 mg nightly for one week  Then increase to 25 mg two times a day for one week  Then increase to 25 mg in the morning and 50 mg at night for one week  Then increase to 50 mg two times a day  Lamotrigine is an anticonvulsant medication, used for many different conditions. Patient was advised not to stop medication abruptly, don't use alcohol. It is very important to titrate this medication slowly to decrease the chances of drug rash. Acute side-effects are dizziness, blurry vision, incoordination, and nausea/vomiting. Long term use can cause headache, tic and insomnia. Rare but serious side-effects such as drug rash can happen.  - Start Ubrelvy 100 mg as needed  Alicia RaisinUbrelvy [employer](ubrogepant) is useful as rescue treatment of migraine with  or without aura in adults. In addition to pain it may help with light sensitivity, noise sensitivity and nausea. Ubrogepant blocks CGRP (calcitonin gene related peptide) receptor. It should not be used if you are taking ketoconazole, itraconazole or clarithromycin. It is not studied in pregnant and breast feeding woman, so they should avoid it.  Ubrelvy 100 mg by mouth as needed at onset or few hours into onset of headache (patient can try 50 mg dose first, if that doesn't help can take 100 mg.) If no  improvement with 100 mg dose - can repeat 2nd dose in 2 hours. No more than 200 mg / day.  There are additional resources to help cover the cost at [email or web address].[facility].[email or web address]/ or text "Alicia [name]" to "4083666232". For additional questions please call 1.844.4.ubrelvy  3. History of psychogenic nonepileptic seizures   4. Cognitive Impairment   - Patient should get TSH, Vit B12, Vit B1, folate, Non Treponemal screening - VDRL.   - We will send referral to speech therapy for cognitive training   - We will send referral to neuropsychology for neurocognitive testing  Patient should have neuropsychological evaluation for further defining and quantification of cognitive impairment. [facility]  [address] [place], [place] [address] [phone] office [phone] fax  Other option are [facility] neuropsychology department, or [facility] neuropsychology department.  4. Anxiety and depression  - Taking Xanax 1 mg daily  - Taking Paxil 40 mg daily   - We will send referral to psychology and psychiatry   Electronically signed by Alicia Provost, MD at 10/21/2018 3:51 PM EDT     Ordered Prescriptions - documented in this encounter Prescription Sig Dispensed Refills Start Date End Date  ubrogepant (UBRELVY) 100 mg Tab  Take 100 mg by mouth continuously as needed for up to 30 days 8 tablet  6 10/21/2018 11/20/2018   Progress Notes - documented in this encounter Alicia Provost, MD - 10/21/2018 1:00 PM EDT Formatting of this note might be different from the original. Ref Provider: Dayna [name]* PCP: Dr. 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 [place].  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 Patient states that on 12/18/2016 around 9-10 pm she was feeling unsteady so she sat down on her couch with it slightly reclined. Around five minutes after that she received a text message and when she went to look down at her phone it appeared to be out of proportion and fuzzy. She laid her phone down and as she did this her eyes jerked back and forth uncontrollably. She began to vomit, had loss of control of bladder, and a nosebleed. [facility] 12/18/2016. Stroke work up was preformed and MRI Brain was positive for stroke (left cerebellum - flocculonodular lobe). She did not receive a TPA. She was not on aspirin or Plavix prior to event. Patient was on atorvastatin for a few years prior to stroke. Her numbers improved and she stopped. Patient had negative stroke  etiology work up including hypercoagulable work up. No evidence of head trauma (dissection). She started having similar symptoms 10 months prior to her stroke. She had similar but less intense episodes before her stroke. Before 2017, she had no diagnosis of why she was having these symptoms. Diagnosed with anemia and treated for anemia. Energy levels improved, but heart rate, exhaustion, confusion and imbalance remained constant. She states she continues to have issues with dizziness, headache, and muscle spasms in her right side of her neck, right arm, and right leg. She states now she can no longer shower alone, do her job requirements, or do physical work for long periods of time. She gets mentally fatigued very easily starting after stroke in 12/2016. Used  to be able to work 50 hours a week. This has gradually improved since then. Many times she has a headache. She does have photophobia, phonophobia and nausea as well as balance. She would have severe headaches. She has had Physical Therapy and occupational therapy. Patient has known stroke risk factors of high cholesterol, oral contraceptive use, history of migraine with aura. Denies significant second hand smoke exposure. She reports no known history of atrial fibrillation, sickle cell disease or trait, post-menopausal hormone replacement therapy, drug abuse, periodontal disease, high CRP, elevated lipoprotein A, increased homocysteine level, peripheral vascular disease, DVT, PE, coagulopathy, Cox II inhibitor use. Medications: Aspirin 81, Plavix, Rosuvastatin   CT head 12/19/2016 - No emergent large vessel occlusion. Normal major intracranial Arteries. Asymmetrically attenuated opacification of the mid-to-distal left PICA relative to the right, which is the artery supplying the vascular territory of the cerebellar infarcts seen on MRI. However, assessment of distal portions of the small posterior fossa arteries is not entirely reliable and this may be only normal variation. Normal CTA of the neck   MRI brain 12/19/2016 - Multifocal acute ischemia within the left posterior circulation, including the medial left cerebellum and left occipital lobe. No contralateral for anterior circulation ischemia. No hemorrhage or mass effect.  MRI of the brain 08/25/2017 - Very little encephalomalacia is evident corresponding to the left PICA and left PCA territory infarcts demonstrated on the 2018 MRI. however, new small chronic infarcts in the right thalamus and left parietal lobe have developed since that time. No superimposed acute intracranial abnormality.  Headaches History of catamenial headaches without migrainous features prior to stroke. Positive family history of migraines in son and in mother. After stroke patient  had associated migrainous features of photophobia and nausea.   Preventative medications: Topiramate (taste side effects, cognitive side effects ), Depakote (GI side effects). Rescue medications:  Seizures / spells She has history of nonepileptic seizures in 12/2015. She had chest tightness, trouble breathing and blacked out. She started hyperventilating and started having seizures. She had an EEG at the time. She would have episodes every 2 weeks.  Patient states at the end of April she had a spell and passed out. Patients husband states patient had another seizure 09/04/2017. Patient was unresponsive. He had to try and make her come to. She does not remember the episode. He states she was "completely out of it". Patient's husband states she had another seizure on 09/07/2017 in which he heard patient grunt and she was completely rigid and out of it.   Cognitive Impairment  Per [facility] 12/25/2018: Patient performed commensurate to or better than her estimated average pre-morbid level on measures of information processing speed, attention, auditory comprehension, Visuospatial perception/organization, visuoconstruction, and right hand fine motor speed. Low average  range on measures of left hand fine motor speed and executive function performed were within subnormal range on only three measures, namely bilateral eye hand coordination, passive auditory attention and confrontation naming.   Her profile was most consistent with a major depressive disorder and an anxiety disorder.   A mild decline in cognitive flexibility from her pre stroke status. In the final analysis, it is believed that a combination of her strokes, psychiatric symptomology, a high level of continuing psychosocial stress, distraction from headaches, ongoing fatigue and sleep disruption are causing or contributing to her subjectively experienced cognitive difficulties. She was encouraged to seek individual  psychotherapy.   History of snoring. In the process of obtaining CPAP machine.  History of anxiety and depression: Managed by primary care physician, Dr. Carlynn [name]  Medications: Paxil, Zoloft, Prozac, Lexapro, Wellbutrin  History of vitamin D deficiency.  Interim History:   Alicia [name] is a 49 y.o. female here for treatment and evaluation of Transient Ischemic Attack,. The history today was obtained from chart review and the patient.   Alicia [name] last visit was on 09/08/2017 Alicia Provost, MD.  Patient states that in the last year she continues to have spell every 3-5 weeks. Her symptoms present as flushing, head spinning, blurred vision, and fatigue. She also has associated nausea, light sensitivity and noise sensitivity. She has difficulty talking during these spells. Endorses paraesthesia in the left side of her face and left arm. She has difficulty standing and walking on her own. She will occasionally have headache with spells. Spells will last 5-10 minutes. Sleep helps with hers symptoms. In between spells she has increased fatigue where she is unable to get out of bed for multiple days. She also has associated cognitive impairment with word finding difficulty. She had an episode where she was driving to a doctors appointment and she forgot how to get there although she had been there multiple times. Patient states all of symptoms feel the same from the night she had her stroke just "toned down".   Patient states that she was recently seen by dermatologists for rash located on her arms and she was told that the biopsy came back as vasculitis. She was then referred to Dr. Renard Matter in rheumatology who completed a further detailed exam she states that the lab work did not result any in answers but did not show any systemic vasculitis.   Patient states that concurrently she has been having chest pain with shortness of breath. She has not seen a cardiologist but she is currently wearing loop  recorder and all reports have been normal per patient. She also has some unexplained abdominal pain.   Patient had vascular neurology consult with [facility] neurology in 09/2017.   EEG 09/28/2017: Moderate diffuse background slowing, there is interval improvement in slowing on 8/12, background activity is showing mild diffuse slowing. Patient events of headache and arms shaking as above show no EEG seizure correlate. MRI brain with and without contrast 09/30/2017: Small focus of T2 hyperintensity in the right thalamus which may correspond to prior right thalamic infarct.  Alicia [name] mentioned that she is taking her medications as prescribed.  Past Medical History:  Past Medical History:  Diagnosis Date  . Anemia  . Bleeding internal hemorrhoids  . Chronic insomnia  . Chronic pain syndrome  . DDD (degenerative disc disease), lumbar  . Depression  . DOE (dyspnea on exertion)  . GAD (generalized anxiety disorder)  . GERD (gastroesophageal reflux disease)  . History of pseudoseizure  .  Hyperlipidemia  . IBS (irritable bowel syndrome)  . Leukocytosis  . Migraine headache  . Psychogenic nonepileptic seizure  . Restless leg syndrome  . Seizures (CMS-HCC)  . Sleep apnea  . Stroke (CMS-HCC)  . Tachycardia  . Tremor   Past Surgical History: History reviewed. No pertinent surgical history. Family History:  Family History  Problem Relation Age of Onset  . High blood pressure (Hypertension) Mother  . Hyperlipidemia (Elevated cholesterol) Mother  . Heart defect Father  hole in heart and lower ventricles reversed  . Von Willebrand disease Maternal Uncle  . Prostate cancer Maternal Grandfather   Social History:  Social History   Socioeconomic History  . Marital status: Married  Spouse name: Not on file  . Number of children: Not on file  . Years of education: Not on file  . Highest education level: Not on file  Occupational History  . Occupation: Airline pilot- [employer]  Social  Needs  . Financial resource strain: Not on file  . Food insecurity  Worry: Not on file  Inability: Not on file  . Transportation needs  Medical: Not on file  Non-medical: Not on file  Tobacco Use  . Smoking status: Never Smoker  . Smokeless tobacco: Never Used  Substance and Sexual Activity  . Alcohol use: Not Currently  . Drug use: Not Currently  . Sexual activity: Yes  Lifestyle  . Physical activity  Days per week: Not on file  Minutes per session: Not on file  . Stress: Not on file  Relationships  . Social Multimedia programmer on phone: Not on file  Gets together: Not on file  Attends religious service: Not on file  Active member of club or organization: Not on file  Attends meetings of clubs or organizations: Not on file  Relationship status: Not on file  Other Topics Concern  . Not on file  Social History Narrative  . Not on file   Allergies:  Allergies  Allergen Reactions  . Azithromycin Diarrhea  . Penicillins Unknown, Hives and Swelling   Medications: Current Outpatient Medications on File Prior to Visit  Medication Sig Dispense Refill  . ALPRAZolam (XANAX XR) 1 MG 24 hr tablet Take 1 mg by mouth every morning  . aspirin 325 MG EC tablet Take 325 mg by mouth once daily 0  . budesonide-formoteroL (SYMBICORT) 80-4.5 mcg/actuation inhaler Inhale into the lungs  . carvedilol (COREG) 3.125 MG tablet Take 3.125 mg by mouth 2 (two) times daily with meals  . coenzyme Q10 10 mg capsule Take 10 mg by mouth once daily  . cyclobenzaprine (FLEXERIL) 10 MG tablet Take 10 mg by mouth nightly  . icosapent ethyl (VASCEPA) 1 gram Cap Take 1 g by mouth 2 (two) times daily  . pantoprazole (PROTONIX) 40 MG DR tablet Take 40 mg by mouth once daily  . PARoxetine (PAXIL) 40 MG tablet Take 40 mg by mouth once daily 1  . rosuvastatin (CRESTOR) 20 MG tablet Take 20 mg by mouth once daily 11  . VITAMIN D2 1,250 mcg (50,000 unit) capsule Take 1 capsule by mouth once a week 0  . zolpidem  (AMBIEN) 10 mg tablet Take 10 mg by mouth nightly as needed   No current facility-administered medications on file prior to visit.   Review of Systems: 13 system ROS form was given to the patient to complete and I have reviewed it. The form was sent for scan to the patient's EHR. Pertinent positives and negatives are documented in the  HPI, and all other systems are negative.  Physical Exam   Vitals Vitals:  10/21/18 1321  BP: 116/78  Pulse: 96  Resp: 16  Weight: 90.3 kg (199 lb)  Height: 172.7 cm (5\' 8" )  PainSc: 0-No pain   Body mass index is 30.26 kg/m.  (Some of the exam changes noted are from previous clinical observations)  General Exam - Patient looks appropriate of age, well built, nourished and appropriately groomed.  - Cardiovascular Exam: S1, S2 heart sounds present - Carotid exam revealed no bruit - Lung exam was clear to auscultation  Neurological Exam - Alert, - Oriented to time, place, person - Attention span and concentration seemed appropriate - Language seemed intact (naming, spontaneous speech, comprehension) - Pupils were reactive to light, extra-ocular movements are normal - Face is symmetric Palate, uvula and tongue movement and other oral inspection was not done due to COVID 19 pandemic.  - Tone is normal in all extremities, no abnormal movements seen - Muscle strength in all extremities seemed normal. - Deep tendon reflexes were symmetric Weakness in upper left extremity  3+ knee jerk bilaterally  Negative Hoffman  Negative babinski sign - Sensations were intact to light touch in all extremities Unsteady wide walking  Romberg's test is negative Decreased arm swing on the left   Assessment and Plan:   In most patients we give written parts of assessment and plan to patient under "patient instructions". So some parts are directed to patient.  1. 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  2. Spells - with associated photophobia, phonophobia, nausea. Patient has difficulty moving and walking without assistance - cannot rule out component of migraine but need to rule out other causes such as seizures.   - We will order MRI brain with and without contrast   - We will order MRA head without contrast   - We will order routine EEG  - Start Lamotrigine 25 mg nightly for one week  Then increase to 25 mg two times a day for one week  Then increase to 25 mg in the morning and 50 mg at night for one week  Then increase to 50 mg two times a day  Lamotrigine is an anticonvulsant medication, used for many different conditions. Patient was advised not to stop medication abruptly, don't use alcohol. It is very important to titrate this medication slowly to decrease the chances of drug rash. Acute side-effects are dizziness, blurry vision, incoordination, and nausea/vomiting. Long term use can cause headache, tic and insomnia. Rare but serious side-effects such as drug rash can happen.  - Start Ubrelvy 100 mg as needed  [employer]) is useful as rescue treatment of migraine with or without aura in adults. In addition to pain it may help with light sensitivity, noise sensitivity and nausea. Ubrogepant blocks CGRP (calcitonin gene related peptide) receptor. It should not be used if you are taking ketoconazole, itraconazole or clarithromycin. It is not studied in pregnant and breast feeding woman, so they should avoid it.  Ubrelvy 100 mg by mouth as needed at onset or few hours into onset of headache (patient can try 50 mg dose  first, if  that doesn't help can take 100 mg.) If no improvement with 100 mg dose - can repeat 2nd dose in 2 hours. No more than 200 mg / day.  There are additional resources to help cover the cost at [email or web address].[email or web address]/ or text "Alicia [name]" to "[phone]". For additional questions please call 1.844.4.ubrelvy  3. History of psychogenic nonepileptic seizures   4. Cognitive Impairment   - Patient should get TSH, Vit B12, Vit B1, folate, Non Treponemal screening - VDRL.   - We will send referral to speech therapy for cognitive training   - We will send referral to neuropsychology for neurocognitive testing  Patient should have neuropsychological evaluation for further defining and quantification of cognitive impairment. [facility]  [address] [place], [place] [address] [phone] office [phone] fax  Other option are [facility] neuropsychology department, or [facility] neuropsychology department.  4. Anxiety and depression  - Taking Xanax 1 mg daily  - Taking Paxil 40 mg daily   - We will send referral to psychology and psychiatry   Return in about 4 months (around 02/20/2019) for spells follow up, with Harlin Rain, PA.  This note is partially written by Chapman Moss, CMA and Zachery Dauer in the presence of and acting as the scribe of Dr. Cristopher Peru, who has reviewed, edited and added to the note to reflect his best personal medical judgment. Payor: BLUE CROSS BLUE SHIELD / Plan: [employer] [place] PREFERRED C[employer] BLUE OPTIONS PPO / Product Type: PPO /  This note was generated in part with voice recognition software and I apologize for any typographical errors that were not detected and corrected.   Dr. Lonell Face, MD Board Certified in Neurology and Clinical Neurophysiology  [facility]

## 2019-05-24 NOTE — Patient Instructions (Addendum)
Your procedure is scheduled on: 05-31-19 [facility] Report to Same Day Surgery 2nd floor medical mall [facility] Entrance-take elevator on left to 2nd floor.  Check in with surgery information desk.) To find out your arrival time please call [phone] between 1PM - 3PM on 05-28-19 FRIDAY  Remember: Instructions that are not followed completely may result in serious medical risk, up to and including death, or upon the discretion of your surgeon and anesthesiologist your surgery may need to be rescheduled.    _x___ 1. Do not eat food after midnight the night before your procedure. NO GUM OR CANDY AFTER MIDNIGHT. You may drink clear liquids up to 2 hours before you are scheduled to arrive at the hospital for your procedure.  Do not drink clear liquids within 2 hours of your scheduled arrival to the hospital.  Clear liquids include  --Water or Apple juice without pulp  --Gatorade  --Black Coffee or Clear Tea (No milk, no creamers, do not add anything to the coffee or Tea   ____Ensure clear carbohydrate drink on the way to the hospital for bariatric patients  ____Ensure clear carbohydrate drink 3 hours before surgery.    __x__ 2. No Alcohol for 24 hours before or after surgery.   __x__3. No Smoking or e-cigarettes for 24 prior to surgery.  Do not use any chewable tobacco products for at least 6 hour prior to surgery   ____  4. Bring all medications with you on the day of surgery if instructed.    __x__ 5. Notify your doctor if there is any change in your medical condition     (cold, fever, infections).    x___6. On the morning of surgery brush your teeth with toothpaste and water.  You may rinse your mouth with mouth wash if you wish.  Do not swallow any toothpaste or mouthwash.   Do not wear jewelry, make-up, hairpins, clips or nail polish.  Do not wear lotions, powders, or perfumes.  Do not shave 48 hours prior to surgery. Men may shave face and neck.  Do not bring valuables to the  hospital.    [facility] is not responsible for any belongings or valuables.               Contacts, dentures or bridgework may not be worn into surgery.  Leave your suitcase in the car. After surgery it may be brought to your room.  For patients admitted to the hospital, discharge time is determined by your  treatment team.  _  Patients discharged the day of surgery will not be allowed to drive home.  You will need someone to drive you home and stay with you the night of your procedure.    Please read over the following fact sheets that you were given:   [facility] Preparing for Surgery  _x___ TAKE THE FOLLOWING MEDICATION THE MORNING OF SURGERY WITH A SMALL SIP OF WATER. These include:  1. COREG (CARVEDILOL)  2. XANAX (ALPRAZOLAM)  3. PROTONIX (PANTOPRAZOLE)  4. TAKE AN EXTRA PROTONIX THE NIGHT BEFORE YOUR SURGERY  5.  6.  ____Fleets enema or Magnesium Citrate as directed.   _x___ Use CHG Soap or sage wipes as directed on instruction sheet   ____ Use inhalers on the day of surgery and bring to hospital day of surgery  ____ Stop Metformin and Janumet 2 days prior to surgery.    ____ Take 1/2 of usual insulin dose the night before surgery and none on the morning  surgery.   _x___ Follow recommendations from Cardiologist, Pulmonologist or PCP regarding stopping Aspirin, Coumadin, Plavix ,Eliquis, Effient, or Pradaxa, and Pletal-DR BYRNETT INSTRUCTED PT TO DECREASE ASPIRIN FROM 325 MG TO 81 MG-INSTRUCTED PATIENT TO CONTINUE 81 MG ASPIRIN BUT NOT TO TAKE ASPIRIN AM OF SURGERY  X____Stop Anti-inflammatories such as Advil, Aleve, Ibuprofen, Motrin, Naproxen, Naprosyn, Goodies powders or aspirin products. OK to take Tylenol   _x___ Stop supplements until after surgery-STOP VASCEPA, PREVITALIZE, AND PROVITALIZE NOW-MAY RESUME AFTER SURGERY

## 2019-05-25 ENCOUNTER — Other Ambulatory Visit: Payer: Self-pay | Admitting: Family Medicine

## 2019-05-25 ENCOUNTER — Encounter: Payer: Self-pay | Admitting: Family Medicine

## 2019-05-25 ENCOUNTER — Encounter
Admission: RE | Admit: 2019-05-25 | Discharge: 2019-05-25 | Disposition: A | Discharge status: Home / Self Care | Payer: BC Managed Care – PPO | Source: Ambulatory Visit | Attending: General Surgery | Admitting: General Surgery

## 2019-05-25 DIAGNOSIS — I6350 Cerebral infarction due to unspecified occlusion or stenosis of unspecified cerebral artery: Secondary | ICD-10-CM | POA: Diagnosis not present

## 2019-05-25 DIAGNOSIS — Z8673 Personal history of transient ischemic attack (TIA), and cerebral infarction without residual deficits: Secondary | ICD-10-CM | POA: Diagnosis not present

## 2019-05-25 DIAGNOSIS — I10 Essential (primary) hypertension: Secondary | ICD-10-CM | POA: Insufficient documentation

## 2019-05-25 DIAGNOSIS — K862 Cyst of pancreas: Secondary | ICD-10-CM

## 2019-05-25 DIAGNOSIS — R001 Bradycardia, unspecified: Secondary | ICD-10-CM | POA: Diagnosis not present

## 2019-05-25 DIAGNOSIS — R9389 Abnormal findings on diagnostic imaging of other specified body structures: Secondary | ICD-10-CM

## 2019-05-25 NOTE — Pre-Procedure Instructions (Signed)
SECURE CHAT WITH DR ZAK:  Pt having lap chole and egd done with Byrnett on 4-12. 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 Last saw neurlogist on 10-2018. Note from Dr Sherryll Burger is in Care Everywhere. I copy and pasted it as well to Epic. Do we need clearance on this pt?  the neurologist's assessment states that she should return for a follow up in 4 months from his note, which should have been in January 2021. If there is no such visit or if patient never followed up, she should do so prior to her procedure.

## 2019-05-25 NOTE — Pre-Procedure Instructions (Addendum)
CALLED MICHELE AT DR BYRNETT'S OFFICE AND NOTIFIED HER THAT PT IS NEEDING NEUROLOGY CLEARANCE. FAXED THIS TO THEIR OFFICE ALONG WITH FAXING IT TO DR [facility] OFFICE. FAX CONFIRMATION RECEIVED FROM BOTH OFFICES.

## 2019-05-27 ENCOUNTER — Other Ambulatory Visit
Admission: RE | Admit: 2019-05-27 | Discharge: 2019-05-27 | Disposition: A | Discharge status: Home / Self Care | Payer: BC Managed Care – PPO | Source: Ambulatory Visit | Attending: General Surgery | Admitting: General Surgery

## 2019-05-27 ENCOUNTER — Other Ambulatory Visit: Payer: Self-pay

## 2019-05-27 DIAGNOSIS — K862 Cyst of pancreas: Secondary | ICD-10-CM | POA: Diagnosis not present

## 2019-05-27 DIAGNOSIS — Z20822 Contact with and (suspected) exposure to covid-19: Secondary | ICD-10-CM | POA: Diagnosis not present

## 2019-05-27 LAB — SARS CORONAVIRUS 2 (TAT 6-24 HRS): SARS Coronavirus 2: NEGATIVE

## 2019-05-27 NOTE — Pre-Procedure Instructions (Signed)
Pt seeing Dr Sherryll Burger 4-9 @ 9:15 for neurology clearance

## 2019-05-28 DIAGNOSIS — Z8669 Personal history of other diseases of the nervous system and sense organs: Secondary | ICD-10-CM | POA: Diagnosis not present

## 2019-05-28 DIAGNOSIS — F419 Anxiety disorder, unspecified: Secondary | ICD-10-CM | POA: Diagnosis not present

## 2019-05-28 DIAGNOSIS — R4189 Other symptoms and signs involving cognitive functions and awareness: Secondary | ICD-10-CM | POA: Diagnosis not present

## 2019-05-28 DIAGNOSIS — I63442 Cerebral infarction due to embolism of left cerebellar artery: Secondary | ICD-10-CM | POA: Diagnosis not present

## 2019-05-28 NOTE — Pre-Procedure Instructions (Signed)
Patient Instructions - documented in this encounter Patient Instructions Jolene Provost, MD - 05/28/2019 9:15 AM EDT  Dear [name], It was our pleasure to participate in your care in person. We have typed up brief summary of what we discussed.  1. 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  2. Spells - with associated photophobia, phonophobia, nausea. Patient has difficulty moving and walking without assistance - cannot rule out component of migraine but need to rule out other causes such as seizures.   3. History of psychogenic nonepileptic seizures   4. Cognitive Impairment  - reviewed TSH, Vit B12, Vit B1, folate, Non Treponemal screening - VDRL.   5. Anxiety and depression  - Taking Xanax 1 mg daily  - Taking Paxil 40 mg daily   6. Surgical clearance - from neurological perspective patient should move forward with gallbladder surgery. Patient should hold Aspirin 7 days prior to surgery. Patient should make sure she restarts medication when surgeon thinks this is appropriate. Informed patient with her holding Aspirin there is increased risk for stroke.   7. COVID Vaccine  Dear Ms. [name], we strongly recommend getting the COVID - 19 vaccine. It is safe and highly effective. Risks associated with COVID infection-related complications are a lot more than the very small unknown risk of vaccine side effects. Once the vaccine is available for your category call [facility] Department at [phone] or [facility] COVID-19 hotline at [phone].    Electronically signed by Jolene Provost, MD at 05/28/2019 10:14 AM EDT

## 2019-05-31 ENCOUNTER — Ambulatory Visit: Payer: BC Managed Care – PPO | Admitting: Anesthesiology

## 2019-05-31 ENCOUNTER — Encounter
Admission: RE | Disposition: A | Discharge status: Home / Self Care | Payer: Self-pay | Source: Home / Self Care | Attending: Internal Medicine

## 2019-05-31 ENCOUNTER — Observation Stay
Admission: RE | Admit: 2019-05-31 | Discharge: 2019-06-01 | Disposition: A | Discharge status: Home / Self Care | Payer: BC Managed Care – PPO | Attending: Internal Medicine | Admitting: Internal Medicine

## 2019-05-31 ENCOUNTER — Other Ambulatory Visit: Payer: Self-pay

## 2019-05-31 ENCOUNTER — Encounter: Payer: Self-pay | Admitting: General Surgery

## 2019-05-31 ENCOUNTER — Ambulatory Visit: Payer: BC Managed Care – PPO

## 2019-05-31 DIAGNOSIS — I69354 Hemiplegia and hemiparesis following cerebral infarction affecting left non-dominant side: Secondary | ICD-10-CM | POA: Insufficient documentation

## 2019-05-31 DIAGNOSIS — Z880 Allergy status to penicillin: Secondary | ICD-10-CM | POA: Insufficient documentation

## 2019-05-31 DIAGNOSIS — G894 Chronic pain syndrome: Secondary | ICD-10-CM

## 2019-05-31 DIAGNOSIS — R569 Unspecified convulsions: Secondary | ICD-10-CM | POA: Insufficient documentation

## 2019-05-31 DIAGNOSIS — K219 Gastro-esophageal reflux disease without esophagitis: Secondary | ICD-10-CM | POA: Diagnosis present

## 2019-05-31 DIAGNOSIS — R2689 Other abnormalities of gait and mobility: Secondary | ICD-10-CM | POA: Insufficient documentation

## 2019-05-31 DIAGNOSIS — K317 Polyp of stomach and duodenum: Secondary | ICD-10-CM | POA: Insufficient documentation

## 2019-05-31 DIAGNOSIS — R1310 Dysphagia, unspecified: Secondary | ICD-10-CM | POA: Diagnosis not present

## 2019-05-31 DIAGNOSIS — K589 Irritable bowel syndrome without diarrhea: Secondary | ICD-10-CM | POA: Insufficient documentation

## 2019-05-31 DIAGNOSIS — Z881 Allergy status to other antibiotic agents status: Secondary | ICD-10-CM | POA: Insufficient documentation

## 2019-05-31 DIAGNOSIS — E663 Overweight: Secondary | ICD-10-CM | POA: Diagnosis not present

## 2019-05-31 DIAGNOSIS — M6281 Muscle weakness (generalized): Secondary | ICD-10-CM | POA: Diagnosis not present

## 2019-05-31 DIAGNOSIS — K819 Cholecystitis, unspecified: Secondary | ICD-10-CM

## 2019-05-31 DIAGNOSIS — R400 Somnolence: Secondary | ICD-10-CM | POA: Insufficient documentation

## 2019-05-31 DIAGNOSIS — F445 Conversion disorder with seizures or convulsions: Secondary | ICD-10-CM

## 2019-05-31 DIAGNOSIS — E785 Hyperlipidemia, unspecified: Secondary | ICD-10-CM | POA: Diagnosis not present

## 2019-05-31 DIAGNOSIS — F418 Other specified anxiety disorders: Secondary | ICD-10-CM

## 2019-05-31 DIAGNOSIS — Z8673 Personal history of transient ischemic attack (TIA), and cerebral infarction without residual deficits: Secondary | ICD-10-CM

## 2019-05-31 DIAGNOSIS — G43009 Migraine without aura, not intractable, without status migrainosus: Secondary | ICD-10-CM | POA: Insufficient documentation

## 2019-05-31 DIAGNOSIS — I10 Essential (primary) hypertension: Secondary | ICD-10-CM | POA: Diagnosis not present

## 2019-05-31 DIAGNOSIS — G2581 Restless legs syndrome: Secondary | ICD-10-CM | POA: Diagnosis not present

## 2019-05-31 DIAGNOSIS — Z90710 Acquired absence of both cervix and uterus: Secondary | ICD-10-CM | POA: Insufficient documentation

## 2019-05-31 DIAGNOSIS — Z79899 Other long term (current) drug therapy: Secondary | ICD-10-CM | POA: Insufficient documentation

## 2019-05-31 DIAGNOSIS — F411 Generalized anxiety disorder: Secondary | ICD-10-CM | POA: Diagnosis not present

## 2019-05-31 DIAGNOSIS — G4730 Sleep apnea, unspecified: Secondary | ICD-10-CM | POA: Diagnosis not present

## 2019-05-31 DIAGNOSIS — M5136 Other intervertebral disc degeneration, lumbar region: Secondary | ICD-10-CM | POA: Diagnosis present

## 2019-05-31 DIAGNOSIS — Z8719 Personal history of other diseases of the digestive system: Secondary | ICD-10-CM | POA: Diagnosis not present

## 2019-05-31 DIAGNOSIS — K8020 Calculus of gallbladder without cholecystitis without obstruction: Secondary | ICD-10-CM | POA: Diagnosis not present

## 2019-05-31 DIAGNOSIS — F329 Major depressive disorder, single episode, unspecified: Secondary | ICD-10-CM | POA: Diagnosis not present

## 2019-05-31 DIAGNOSIS — Z6830 Body mass index (BMI) 30.0-30.9, adult: Secondary | ICD-10-CM | POA: Insufficient documentation

## 2019-05-31 DIAGNOSIS — R109 Unspecified abdominal pain: Secondary | ICD-10-CM | POA: Diagnosis present

## 2019-05-31 DIAGNOSIS — Z7982 Long term (current) use of aspirin: Secondary | ICD-10-CM | POA: Insufficient documentation

## 2019-05-31 DIAGNOSIS — Z8249 Family history of ischemic heart disease and other diseases of the circulatory system: Secondary | ICD-10-CM | POA: Insufficient documentation

## 2019-05-31 DIAGNOSIS — Z87898 Personal history of other specified conditions: Secondary | ICD-10-CM

## 2019-05-31 DIAGNOSIS — Z8669 Personal history of other diseases of the nervous system and sense organs: Secondary | ICD-10-CM

## 2019-05-31 DIAGNOSIS — Z90722 Acquired absence of ovaries, bilateral: Secondary | ICD-10-CM | POA: Insufficient documentation

## 2019-05-31 DIAGNOSIS — E559 Vitamin D deficiency, unspecified: Secondary | ICD-10-CM | POA: Diagnosis present

## 2019-05-31 DIAGNOSIS — K8010 Calculus of gallbladder with chronic cholecystitis without obstruction: Principal | ICD-10-CM | POA: Insufficient documentation

## 2019-05-31 HISTORY — PX: CHOLECYSTECTOMY: SHX55

## 2019-05-31 HISTORY — PX: ESOPHAGOGASTRODUODENOSCOPY: SHX5428

## 2019-05-31 LAB — CBC WITH DIFFERENTIAL/PLATELET
Abs Immature Granulocytes: 0.03 10*3/uL (ref 0.00–0.07)
Basophils Absolute: 0 10*3/uL (ref 0.0–0.1)
Basophils Relative: 0 %
Eosinophils Absolute: 0 10*3/uL (ref 0.0–0.5)
Eosinophils Relative: 0 %
HCT: 38.3 % (ref 36.0–46.0)
Hemoglobin: 13.1 g/dL (ref 12.0–15.0)
Immature Granulocytes: 0 %
Lymphocytes Relative: 7 %
Lymphs Abs: 0.6 10*3/uL — ABNORMAL LOW (ref 0.7–4.0)
MCH: 30.5 pg (ref 26.0–34.0)
MCHC: 34.2 g/dL (ref 30.0–36.0)
MCV: 89.3 fL (ref 80.0–100.0)
Monocytes Absolute: 0 10*3/uL — ABNORMAL LOW (ref 0.1–1.0)
Monocytes Relative: 0 %
Neutro Abs: 8.7 10*3/uL — ABNORMAL HIGH (ref 1.7–7.7)
Neutrophils Relative %: 93 %
Platelets: 235 10*3/uL (ref 150–400)
RBC: 4.29 MIL/uL (ref 3.87–5.11)
RDW: 13 % (ref 11.5–15.5)
WBC: 9.4 10*3/uL (ref 4.0–10.5)
nRBC: 0 % (ref 0.0–0.2)

## 2019-05-31 LAB — COMPREHENSIVE METABOLIC PANEL
ALT: 23 U/L (ref 0–44)
AST: 28 U/L (ref 15–41)
Albumin: 3.9 g/dL (ref 3.5–5.0)
Alkaline Phosphatase: 66 U/L (ref 38–126)
Anion gap: 9 (ref 5–15)
BUN: 12 mg/dL (ref 6–20)
CO2: 23 mmol/L (ref 22–32)
Calcium: 8.9 mg/dL (ref 8.9–10.3)
Chloride: 106 mmol/L (ref 98–111)
Creatinine, Ser: 0.94 mg/dL (ref 0.44–1.00)
GFR calc Af Amer: >60 mL/min (ref >60)
GFR calc non Af Amer: >60 mL/min (ref >60)
Glucose, Bld: 156 mg/dL — ABNORMAL HIGH (ref 70–99)
Potassium: 4.3 mmol/L (ref 3.5–5.1)
Sodium: 138 mmol/L (ref 135–145)
Total Bilirubin: 0.8 mg/dL (ref 0.3–1.2)
Total Protein: 6.8 g/dL (ref 6.5–8.1)

## 2019-05-31 LAB — HIV ANTIBODY (ROUTINE TESTING W REFLEX): HIV Screen 4th Generation wRfx: NONREACTIVE

## 2019-05-31 LAB — MAGNESIUM: Magnesium: 2.2 mg/dL (ref 1.7–2.4)

## 2019-05-31 LAB — GLUCOSE, CAPILLARY: Glucose-Capillary: 149 mg/dL — ABNORMAL HIGH (ref 70–99)

## 2019-05-31 LAB — TSH: TSH: 0.561 u[IU]/mL (ref 0.350–4.500)

## 2019-05-31 SURGERY — LAPAROSCOPIC CHOLECYSTECTOMY WITH INTRAOPERATIVE CHOLANGIOGRAM
Anesthesia: General

## 2019-05-31 MED ORDER — LACTATED RINGERS IV SOLN: INTRAVENOUS | Status: DC

## 2019-05-31 MED ORDER — DEXAMETHASONE SODIUM PHOSPHATE 10 MG/ML IJ SOLN
INTRAMUSCULAR | Status: DC | PRN
  Administered 2019-05-31: 10 mg via INTRAVENOUS

## 2019-05-31 MED ORDER — LACTATED RINGERS IV SOLN: INTRAVENOUS | Status: DC | PRN

## 2019-05-31 MED ORDER — FENTANYL CITRATE (PF) 100 MCG/2ML IJ SOLN
INTRAMUSCULAR | Status: AC
  Administered 2019-05-31: 25 ug via INTRAVENOUS
  Filled 2019-05-31: qty 2

## 2019-05-31 MED ORDER — KETOROLAC TROMETHAMINE 30 MG/ML IJ SOLN
INTRAMUSCULAR | Status: AC
  Filled 2019-05-31: qty 2

## 2019-05-31 MED ORDER — GLYCOPYRROLATE 0.2 MG/ML IJ SOLN
INTRAMUSCULAR | Status: DC | PRN
  Administered 2019-05-31: .2 mg via INTRAVENOUS

## 2019-05-31 MED ORDER — HYDROMORPHONE HCL 1 MG/ML IJ SOLN
INTRAMUSCULAR | Status: AC
  Administered 2019-05-31: 0.5 mg via INTRAVENOUS
  Filled 2019-05-31: qty 0.5

## 2019-05-31 MED ORDER — HYDROMORPHONE HCL 1 MG/ML IJ SOLN: 0.5000 mg | Freq: Once | INTRAMUSCULAR | Status: AC

## 2019-05-31 MED ORDER — ONDANSETRON HCL 4 MG/2ML IJ SOLN
INTRAMUSCULAR | Status: AC
  Filled 2019-05-31: qty 4

## 2019-05-31 MED ORDER — ROCURONIUM BROMIDE 10 MG/ML (PF) SYRINGE
PREFILLED_SYRINGE | INTRAVENOUS | Status: AC
  Filled 2019-05-31: qty 10

## 2019-05-31 MED ORDER — ENOXAPARIN SODIUM 40 MG/0.4ML ~~LOC~~ SOLN: 40.0000 mg | SUBCUTANEOUS | Status: DC

## 2019-05-31 MED ORDER — MORPHINE SULFATE (PF) 4 MG/ML IV SOLN
INTRAVENOUS | Status: AC
  Administered 2019-05-31: 4 mg via INTRAVENOUS
  Filled 2019-05-31: qty 1

## 2019-05-31 MED ORDER — SUCCINYLCHOLINE CHLORIDE 200 MG/10ML IV SOSY
PREFILLED_SYRINGE | INTRAVENOUS | Status: AC
  Filled 2019-05-31: qty 10

## 2019-05-31 MED ORDER — ONDANSETRON HCL 4 MG/2ML IJ SOLN
INTRAMUSCULAR | Status: AC
  Filled 2019-05-31: qty 2

## 2019-05-31 MED ORDER — BISACODYL 5 MG PO TBEC
5.0000 mg | DELAYED_RELEASE_TABLET | Freq: Every day | ORAL | Status: DC | PRN
  Filled 2019-05-31: qty 1

## 2019-05-31 MED ORDER — FENTANYL CITRATE (PF) 100 MCG/2ML IJ SOLN
INTRAMUSCULAR | Status: AC
  Filled 2019-05-31: qty 2

## 2019-05-31 MED ORDER — HYDROCODONE-ACETAMINOPHEN 5-325 MG PO TABS: 1.0000 | ORAL_TABLET | ORAL | 0 refills | Status: DC | PRN

## 2019-05-31 MED ORDER — DIPHENHYDRAMINE HCL 50 MG/ML IJ SOLN
INTRAMUSCULAR | Status: AC
  Administered 2019-05-31: 12.5 mg via INTRAVENOUS
  Filled 2019-05-31: qty 1

## 2019-05-31 MED ORDER — SODIUM CHLORIDE (PF) 0.9 % IJ SOLN
INTRAMUSCULAR | Status: AC
  Filled 2019-05-31: qty 50

## 2019-05-31 MED ORDER — OXYCODONE HCL 5 MG PO TABS
ORAL_TABLET | ORAL | Status: AC
  Filled 2019-05-31: qty 1

## 2019-05-31 MED ORDER — LORAZEPAM 2 MG/ML IJ SOLN: 1.0000 mg | Freq: Once | INTRAMUSCULAR | Status: AC | PRN

## 2019-05-31 MED ORDER — SUGAMMADEX SODIUM 500 MG/5ML IV SOLN
INTRAVENOUS | Status: AC
  Filled 2019-05-31: qty 5

## 2019-05-31 MED ORDER — SUCCINYLCHOLINE CHLORIDE 20 MG/ML IJ SOLN
INTRAMUSCULAR | Status: DC | PRN
  Administered 2019-05-31: 100 mg via INTRAVENOUS

## 2019-05-31 MED ORDER — ACETAMINOPHEN 650 MG RE SUPP
650.0000 mg | Freq: Four times a day (QID) | RECTAL | Status: DC | PRN
  Filled 2019-05-31: qty 1

## 2019-05-31 MED ORDER — ROCURONIUM BROMIDE 100 MG/10ML IV SOLN
INTRAVENOUS | Status: DC | PRN
  Administered 2019-05-31: 40 mg via INTRAVENOUS
  Administered 2019-05-31: 5 mg via INTRAVENOUS

## 2019-05-31 MED ORDER — PROPOFOL 10 MG/ML IV BOLUS
INTRAVENOUS | Status: AC
  Filled 2019-05-31: qty 20

## 2019-05-31 MED ORDER — DIPHENHYDRAMINE HCL 50 MG/ML IJ SOLN: 12.5000 mg | Freq: Once | INTRAMUSCULAR | Status: AC

## 2019-05-31 MED ORDER — FENTANYL CITRATE (PF) 100 MCG/2ML IJ SOLN
25.0000 ug | INTRAMUSCULAR | Status: DC | PRN
  Administered 2019-05-31: 25 ug via INTRAVENOUS

## 2019-05-31 MED ORDER — POLYETHYLENE GLYCOL 3350 17 G PO PACK
17.0000 g | PACK | Freq: Every day | ORAL | Status: DC | PRN
  Filled 2019-05-31: qty 1

## 2019-05-31 MED ORDER — PROPOFOL 10 MG/ML IV BOLUS
INTRAVENOUS | Status: DC | PRN
  Administered 2019-05-31: 150 mg via INTRAVENOUS

## 2019-05-31 MED ORDER — ONDANSETRON HCL 4 MG/2ML IJ SOLN
INTRAMUSCULAR | Status: DC | PRN
  Administered 2019-05-31 (×2): 4 mg via INTRAVENOUS

## 2019-05-31 MED ORDER — MORPHINE SULFATE (PF) 4 MG/ML IV SOLN: 4.0000 mg | INTRAVENOUS | Status: DC | PRN

## 2019-05-31 MED ORDER — ONDANSETRON HCL 4 MG PO TABS: 4.0000 mg | ORAL_TABLET | Freq: Four times a day (QID) | ORAL | Status: DC | PRN

## 2019-05-31 MED ORDER — FENTANYL CITRATE (PF) 100 MCG/2ML IJ SOLN
INTRAMUSCULAR | Status: DC | PRN
  Administered 2019-05-31 (×2): 50 ug via INTRAVENOUS

## 2019-05-31 MED ORDER — OXYCODONE HCL 5 MG PO TABS
5.0000 mg | ORAL_TABLET | Freq: Once | ORAL | Status: AC | PRN
  Administered 2019-05-31: 5 mg via ORAL

## 2019-05-31 MED ORDER — ONDANSETRON HCL 4 MG/2ML IJ SOLN: 4.0000 mg | Freq: Once | INTRAMUSCULAR | Status: DC | PRN

## 2019-05-31 MED ORDER — ACETAMINOPHEN 325 MG PO TABS: 650.0000 mg | ORAL_TABLET | Freq: Four times a day (QID) | ORAL | Status: DC | PRN

## 2019-05-31 MED ORDER — OXYCODONE HCL 5 MG/5ML PO SOLN: 5.0000 mg | Freq: Once | ORAL | Status: AC | PRN

## 2019-05-31 MED ORDER — SODIUM CHLORIDE 0.9 % IV SOLN
INTRAVENOUS | Status: DC | PRN
  Administered 2019-05-31: 10 mL

## 2019-05-31 MED ORDER — KETOROLAC TROMETHAMINE 30 MG/ML IJ SOLN
INTRAMUSCULAR | Status: AC
  Filled 2019-05-31: qty 1

## 2019-05-31 MED ORDER — MIDAZOLAM HCL 2 MG/2ML IJ SOLN
INTRAMUSCULAR | Status: AC
  Filled 2019-05-31: qty 2

## 2019-05-31 MED ORDER — EPHEDRINE 5 MG/ML INJ
INTRAVENOUS | Status: AC
  Filled 2019-05-31: qty 10

## 2019-05-31 MED ORDER — ESMOLOL HCL 100 MG/10ML IV SOLN
INTRAVENOUS | Status: AC
  Filled 2019-05-31: qty 10

## 2019-05-31 MED ORDER — DEXAMETHASONE SODIUM PHOSPHATE 10 MG/ML IJ SOLN
INTRAMUSCULAR | Status: AC
  Filled 2019-05-31: qty 1

## 2019-05-31 MED ORDER — PHENYLEPHRINE HCL (PRESSORS) 10 MG/ML IV SOLN
INTRAVENOUS | Status: DC | PRN
  Administered 2019-05-31 (×2): 100 ug via INTRAVENOUS

## 2019-05-31 MED ORDER — ACETAMINOPHEN 10 MG/ML IV SOLN
INTRAVENOUS | Status: DC | PRN
  Administered 2019-05-31: 1000 mg via INTRAVENOUS

## 2019-05-31 MED ORDER — LIDOCAINE HCL (CARDIAC) PF 100 MG/5ML IV SOSY
PREFILLED_SYRINGE | INTRAVENOUS | Status: DC | PRN
  Administered 2019-05-31: 100 mg via INTRAVENOUS

## 2019-05-31 MED ORDER — SUGAMMADEX SODIUM 200 MG/2ML IV SOLN
INTRAVENOUS | Status: DC | PRN
  Administered 2019-05-31: 480 mg via INTRAVENOUS

## 2019-05-31 MED ORDER — GLYCOPYRROLATE 0.2 MG/ML IJ SOLN
INTRAMUSCULAR | Status: AC
  Filled 2019-05-31: qty 1

## 2019-05-31 MED ORDER — PHENOL 1.4 % MT LIQD
1.0000 | OROMUCOSAL | Status: DC | PRN
  Administered 2019-05-31 (×2): 1 via OROMUCOSAL
  Filled 2019-05-31: qty 177

## 2019-05-31 MED ORDER — ONDANSETRON HCL 4 MG/2ML IJ SOLN: 4.0000 mg | Freq: Four times a day (QID) | INTRAMUSCULAR | Status: DC | PRN

## 2019-05-31 MED ORDER — MIDAZOLAM HCL 2 MG/2ML IJ SOLN
INTRAMUSCULAR | Status: DC | PRN
  Administered 2019-05-31 (×2): 1 mg via INTRAVENOUS

## 2019-05-31 MED ORDER — MAGNESIUM CITRATE PO SOLN
1.0000 | Freq: Once | ORAL | Status: DC | PRN
  Filled 2019-05-31: qty 296

## 2019-05-31 MED ORDER — ACETAMINOPHEN 10 MG/ML IV SOLN
INTRAVENOUS | Status: AC
  Filled 2019-05-31: qty 100

## 2019-05-31 SURGICAL SUPPLY — 41 items
APL PRP STRL LF DISP 70% ISPRP (MISCELLANEOUS) ×1
APPLIER CLIP ROT 10 11.4 M/L (STAPLE) ×2
APR CLP MED LRG 11.4X10 (STAPLE) ×1
BAG SPEC RTRVL LRG 6X4 10 (ENDOMECHANICALS)
BLADE SURG 11 STRL SS SAFETY (MISCELLANEOUS) ×2 IMPLANT
CANISTER SUCT 1200ML W/VALVE (MISCELLANEOUS) ×2 IMPLANT
CANNULA DILATOR 10 W/SLV (CANNULA) ×2 IMPLANT
CANNULA DILATOR 5 W/SLV (CANNULA) ×4 IMPLANT
CATH CHOLANG 76X19 KUMAR (CATHETERS) ×2 IMPLANT
CHLORAPREP W/TINT 26 (MISCELLANEOUS) ×2 IMPLANT
CLIP APPLIE ROT 10 11.4 M/L (STAPLE) ×1 IMPLANT
CONRAY 60ML FOR OR (MISCELLANEOUS) ×2 IMPLANT
COVER WAND RF STERILE (DRAPES) ×1 IMPLANT
DISSECTOR KITTNER STICK (MISCELLANEOUS) ×1 IMPLANT
DISSECTORS/KITTNER STICK (MISCELLANEOUS)
DRAPE 3/4 80X56 (DRAPES) ×1 IMPLANT
DRSG TEGADERM 2-3/8X2-3/4 SM (GAUZE/BANDAGES/DRESSINGS) ×8 IMPLANT
DRSG TELFA 4X3 1S NADH ST (GAUZE/BANDAGES/DRESSINGS) ×2 IMPLANT
ELECT REM PT RETURN 9FT ADLT (ELECTROSURGICAL) ×2
ELECTRODE REM PT RTRN 9FT ADLT (ELECTROSURGICAL) ×1 IMPLANT
GLOVE BIO SURGEON STRL SZ7.5 (GLOVE) ×4 IMPLANT
GLOVE INDICATOR 8.0 STRL GRN (GLOVE) ×4 IMPLANT
GOWN STRL REUS W/ TWL LRG LVL3 (GOWN DISPOSABLE) ×3 IMPLANT
GOWN STRL REUS W/TWL LRG LVL3 (GOWN DISPOSABLE) ×6
IRRIGATION STRYKERFLOW (MISCELLANEOUS) ×1 IMPLANT
IRRIGATOR STRYKERFLOW (MISCELLANEOUS) ×2
IV LACTATED RINGERS 1000ML (IV SOLUTION) ×2 IMPLANT
KIT TURNOVER KIT A (KITS) ×2 IMPLANT
LABEL OR SOLS (LABEL) ×2 IMPLANT
NDL INSUFF ACCESS 14 VERSASTEP (NEEDLE) ×2 IMPLANT
NS IRRIG 500ML POUR BTL (IV SOLUTION) ×2 IMPLANT
PACK LAP CHOLECYSTECTOMY (MISCELLANEOUS) ×2 IMPLANT
POUCH SPECIMEN RETRIEVAL 10MM (ENDOMECHANICALS) IMPLANT
SCISSORS METZENBAUM CVD 33 (INSTRUMENTS) ×2 IMPLANT
SET TUBE SMOKE EVAC HIGH FLOW (TUBING) ×2 IMPLANT
STRIP CLOSURE SKIN 1/2X4 (GAUZE/BANDAGES/DRESSINGS) ×2 IMPLANT
SUT VIC AB 0 CT2 27 (SUTURE) ×2 IMPLANT
SUT VIC AB 4-0 FS2 27 (SUTURE) ×2 IMPLANT
SWABSTK COMLB BENZOIN TINCTURE (MISCELLANEOUS) ×2 IMPLANT
TROCAR XCEL NON-BLD 11X100MML (ENDOMECHANICALS) ×2 IMPLANT
WATER STERILE IRR 1000ML POUR (IV SOLUTION) ×1 IMPLANT

## 2019-05-31 NOTE — Progress Notes (Signed)
[facility] doctor in to see pt

## 2019-05-31 NOTE — Addendum Note (Signed)
Addendum  created 05/31/19 1243 by Corinda Gubler, MD   Clinical Note Signed, Order list changed

## 2019-05-31 NOTE — Progress Notes (Signed)
Dr Lemar Livings  Called left message with husband   Pt resting quietly at present

## 2019-05-31 NOTE — Addendum Note (Signed)
Addendum  created 05/31/19 1143 by Mohammed Kindle, CRNA   Charge Capture section accepted

## 2019-05-31 NOTE — Progress Notes (Signed)
Noted pt shaky when she gets up   Speech slow at times  Oriented to person place

## 2019-05-31 NOTE — Op Note (Signed)
Preoperative diagnosis: 1) chronic cholecystitis and cholelithiasis; 2) gastroesophageal reflux.  Postoperative diagnosis: Same, gastric polyps.  Operative procedure: 1) laparoscopic cholecystectomy with intraoperative cholangiogram; 2): Upper endoscopy with biopsy.  Operating surgeon: Donnalee Curry, MD  Anesthesia: General endotracheal.  Estimated blood loss: Less than 5 cc.  Clinical note: This 49 year old woman has had episodic right upper quadrant pain with radiation of the back in the epigastrium.  CT imaging showed evidence of cholelithiasis.  Her clinical history was consistent with chronic cholecystitis.  She was a candidate for elective cholecystectomy.  She has had a long history of gastroesophageal reflux.  Prior EGD under sedation/monitored anesthesia care was poorly tolerated.  Was elected to repeat the study at this time while she was under anesthesia for her cholecystectomy.  Operative note: The patient underwent general endotracheal anesthesia without difficulty.  The abdomen was cleansed with ChloraPrep and draped.  In Trendelenburg position and through a transumbilical incision a varies needle was placed into the abdominal cavity.  After assuring intra-abdominal location with a hanging drop test the abdomen was insufflated with CO2 at 10 mmHg pressure.  A 10 mm Step port was expanded.  Inspection showed no evidence of injury from initial port placement.  The abdomen was insufflated with CO2 at 10 mmHg pressure.  The patient was rolled to the left and placed in reverse Trendelenburg position.  The left lobe was somewhat floppy but did not significantly obscured the view of the neck of the gallbladder.  An 11 mm XL port was placed in the epigastrium and 2-5 mm Ports were placed in the right side of the abdomen.  The gallbladder was placed on cephalad traction.  The cholecystoduodenal ligament was taken down and the cystic duct cleared.  Fluoroscopic cholangiograms were completed using  10 cc of one half strength Conray 60.  This showed prompt reflux in the common hepatic duct and right and left hepatic ducts, free flow in the common bile duct and into the duodenum.  No evidence of retained stones.  The cystic duct was doubly clipped and divided.  Arterial branches were then doubly clipped and divided and the gallbladder removed from the liver bed making use of hook cautery dissection.  The gallbladder was delivered to the umbilical port site without incident.  Examination from the epigastric site showed no evidence of injury from initial port placement.  The right upper quadrant was inspected and good hemostasis was noted.  The area was irrigated with lactated Ringer solution.  The abdomen was then desufflated and ports removed under direct vision.  Skin incisions were closed with 4-0 Vicryl subcuticular sutures.  Benzoin, Steri-Strips, Telfa and Tegaderm dressings were applied.  Upper endoscopy was then undertaken.  The endoscope was passed under direct vision through the cricopharyngeus muscle into the esophagus.  This was advanced to the junction of the second third portion of the duodenum.  Free bile flow from the ampulla was visualized.  There was no distortion in the duodenal C-loop evident.  The pylorus was visualized and was without scarring or ulceration.  The duodenal bulb was clear.  The body of the stomach showed a few small polyps, also noted at the gastric fundus.  The latter were biopsied with cold forceps.  Retroflexed view showed no evidence of hiatal hernia.  Visualized GE J was free of inflammation.  The stomach was then desufflated under direct vision and the endoscope withdrawn.  The patient tolerated the procedure well and was taken recovery in stable condition.

## 2019-05-31 NOTE — Procedures (Shared)
Pt. Stating need to "pee" but could not. Bladder scan performed and registered 640. Shortly afterwards pt. Was able to void at [facility] commode.  ?

## 2019-05-31 NOTE — Anesthesia Postprocedure Evaluation (Signed)
Anesthesia Post Note  Patient: [name] Note [name]  Procedure(s) Performed: LAPAROSCOPIC CHOLECYSTECTOMY WITH INTRAOPERATIVE CHOLANGIOGRAM (N/A ) ESOPHAGOGASTRODUODENOSCOPY (EGD) (N/A )  Patient location during evaluation: PACU Anesthesia Type: General Level of consciousness: awake and alert Pain management: pain level controlled Vital Signs Assessment: post-procedure vital signs reviewed and stable Respiratory status: spontaneous breathing, nonlabored ventilation, respiratory function stable and patient connected to nasal cannula oxygen Cardiovascular status: blood pressure returned to baseline and stable Postop Assessment: no apparent nausea or vomiting Anesthetic complications: no     Last Vitals:  Vitals:   05/31/19 1044 05/31/19 1059  BP: (!) 110/41 (!) 106/46  Pulse: (!) 114 (!) 113  Resp: 18 (!) 22  Temp: (!) 36.3 C   SpO2: 99% 99%    Last Pain:  Vitals:   05/31/19 1059  TempSrc:   PainSc: Asleep                 Corinda Gubler

## 2019-05-31 NOTE — H&P (Signed)
History and Physical    Alicia [name] ZOX:[id number] DOB: 06/01/1970 DOA: 05/31/2019  PCP: Steele Sizer, MD  Patient coming from: Home  I have personally briefly reviewed patient's old medical records in [place]  Chief Complaint: Seizure-like activity  HPI: Alicia [name] is a 49 y.o. female with medical history significant of stroke with residual left-sided weakness, migraine, sleep apnea, iron deficiency anemia, lumbar degenerative disc disease, kidney stones, insomnia, depression with anxiety and history of nonepileptic seizure-like activity.  Patient underwent laparoscopic cholecystectomy today which was uncomplicated.  However, in PACU patient was witnessed to have seizure-like activity with shaking of the bilateral upper extremities and loss of consciousness.  Once this resolved patient appeared to be postictal as well.  Neurology was contacted and recommended admission for EEG and further work-up given patient's history of seizures and also prior stroke.  Spoke with patient's husband by phone.  He was able to see the patient in PACU and reports that her current symptoms are consistent with her prior reactions to stress.  He states that any emotional or physical stressors caused these episodes with seizure-like activity, speech abnormalities and increased weakness from her prior strokes.  He states that it usually takes her several days to recover from these episodes.  Vitals in PACU: Temp 97.2, HR 10 4-1 14, blood pressure 118/77, O2 sat 95% on 2 L/min nasal cannula oxygen.  Most recent labs are from 05/04/2019 which showed a unremarkable CMP other than glucose 115.  Lipid panel showed triglycerides 305, LDL 118, HDL 34.  CBC was normal at that time.  A1c 5.7%.  Patient admitted for observation to hospitalist service with neurology consulted.  Review of Systems: As per HPI otherwise 10 point review of systems negative.    Past Medical History:  Diagnosis Date    . Anemia   . Anxiety   . Depression   . Dyspnea    SINCE STROKE  . GERD (gastroesophageal reflux disease)   . Hyperlipidemia   . Hypertension   . IBS (irritable bowel syndrome)   . Migraine   . Previous cesarean delivery, delivered, with or without mention of antepartum condition 01/19/2012  . Psychogenic nonepileptic seizure    hx/notes 12/19/2016-LAST SEIZURE IN 2019-ON NO MEDS AS OF 05-24-19  . Restless leg syndrome   . Sleep apnea    USES CPAP  . Stroke ([place]) 12/18/2016   Acute arterial ischemic stroke, multifocal, posterior circulation /notes 12/19/2016-LEFT SIDED WEAKNESS, MEMORY FATIGUE AND TROUBLE FINDING HER WORDS [facility]    Past Surgical History:  Procedure Laterality Date  . ABDOMINOPLASTY  Feb. 2015  . CESAREAN SECTION  2011  . COLONOSCOPY WITH PROPOFOL N/A 11/26/2016   Procedure: COLONOSCOPY WITH PROPOFOL;  Surgeon: Jonathon Bellows, MD;  Location: [facility] ENDOSCOPY;  Service: Gastroenterology;  Laterality: N/A;  . CYSTOSCOPY  02/02/2015   Procedure: CYSTOSCOPY;  Surgeon: Gae Dry, MD;  Location: [facility] ORS;  Service: Gynecology;;  . Brigitte Pulse AND CURETTAGE OF UTERUS  2003, 2005, 2008  . ESOPHAGOGASTRODUODENOSCOPY (EGD) WITH PROPOFOL N/A 11/26/2016   Procedure: ESOPHAGOGASTRODUODENOSCOPY (EGD) WITH PROPOFOL;  Surgeon: Jonathon Bellows, MD;  Location: [facility] ENDOSCOPY;  Service: Gastroenterology;  Laterality: N/A;  . HERNIA REPAIR  [id number]   Umbilical  . KNEE ARTHROSCOPY Right 2006  . LAPAROSCOPIC BILATERAL SALPINGECTOMY Bilateral 02/02/2015   Procedure: LAPAROSCOPIC BILATERAL SALPINGECTOMY;  Surgeon: Gae Dry, MD;  Location: [facility] ORS;  Service: Gynecology;  Laterality: Bilateral;  . LAPAROSCOPIC HYSTERECTOMY N/A 02/02/2015   Procedure: HYSTERECTOMY  TOTAL LAPAROSCOPIC;  Surgeon: Nadara Mustardobert P Harris, MD;  Location: [facility] ORS;  Service: Gynecology;  Laterality: N/A;  . LOOP RECORDER INSERTION N/A 12/20/2016   Procedure: LOOP RECORDER INSERTION;  Surgeon: Regan Lemmingamnitz, Will Martin, MD;   Location: [facility] INVASIVE CV LAB;  Service: Cardiovascular;  Laterality: N/A;  . REDUCTION MAMMAPLASTY Bilateral December 2015  . TEE WITHOUT CARDIOVERSION N/A 12/20/2016   Procedure: TRANSESOPHAGEAL ECHOCARDIOGRAM (TEE);  Surgeon: Lars MassonNelson, Katarina H, MD;  [facility];  Service: Cardiovascular;  Laterality: N/A;     reports that she has never smoked. She has never used smokeless tobacco. She reports that she does not drink alcohol or use drugs.  Allergies  Allergen Reactions  . Azithromycin Diarrhea  . Penicillins Hives and Swelling    Has patient had a PCN reaction causing immediate rash, facial/tongue/throat swelling, SOB or lightheadedness with hypotension: Yes Has patient had a PCN reaction causing severe rash involving mucus membranes or skin necrosis: No Has patient had a PCN reaction that required hospitalization No Has patient had a PCN reaction occurring within the last 10 years: No If all of the above answers are "NO", then may proceed with Cephalosporin use.    Family History  Problem Relation Age of Onset  . Hypertension Mother   . Hyperlipidemia Mother   . Heart Problems Father        hole in heart and lower ventricles reversed  . Prostate cancer Maternal Grandfather   . Von Willebrand disease Maternal Uncle      Prior to Admission medications   Medication Sig Start Date End Date Taking? Authorizing Provider  ALPRAZolam (XANAX XR) 1 MG 24 hr tablet Take 1 tablet (1 mg total) by mouth daily. Patient taking differently: Take 1 mg by mouth every morning.  04/28/19  Yes [name], [name], MD  aspirin EC 81 MG tablet Take 81 mg by mouth daily.   Yes [provider]  carvedilol (COREG) 3.125 MG tablet TAKE 1 TABLET(3.125 MG) BY MOUTH TWICE DAILY WITH A MEAL Patient taking differently: Take 3.125 mg by mouth 2 (two) times daily with a meal.  01/28/19  Yes Sowles, Danna HeftyKrichna, MD  cyclobenzaprine (FLEXERIL) 10 MG tablet Take 1 tablet (10 mg total) by mouth at  bedtime. Patient taking differently: Take 10 mg by mouth every evening.  04/28/19  Yes Sowles, Danna HeftyKrichna, MD  icosapent Ethyl (VASCEPA) 1 g capsule Take 1 capsule (1 g total) by mouth 2 (two) times daily. Patient taking differently: Take 1 g by mouth daily.  01/28/19  Yes Sowles, Danna HeftyKrichna, MD  OVER THE COUNTER MEDICATION Take 1 tablet by mouth daily. Previtalize   Yes [provider]  OVER THE COUNTER MEDICATION Take 1 tablet by mouth daily. Provitalize   Yes [provider]  pantoprazole (PROTONIX) 40 MG tablet Take 1 tablet (40 mg total) by mouth daily. Patient taking differently: Take 40 mg by mouth every morning.  01/28/19  Yes Sowles, Danna HeftyKrichna, MD  PARoxetine (PAXIL) 20 MG tablet Take 3 tablets (60 mg total) by mouth every morning. Patient taking differently: Take 60 mg by mouth every evening.  11/10/18  Yes Sowles, Danna HeftyKrichna, MD  rosuvastatin (CRESTOR) 20 MG tablet Take 1 tablet (20 mg total) by mouth daily. Patient taking differently: Take 20 mg by mouth at bedtime.  04/28/19  Yes Sowles, Danna HeftyKrichna, MD  UBRELVY 100 MG TABS Take 100 mg by mouth daily as needed (migraine).  10/27/18  Yes Lonell FaceShah, Hemang K, MD  Vitamin D, Ergocalciferol, (DRISDOL) 1.25 MG (50000 UNIT)  CAPS capsule TAKE 1 CAPSULE BY MOUTH EVERY 7 DAYS Patient taking differently: Take 50,000 Units by mouth every 7 (seven) days.  04/26/19  Yes Sowles, Danna Hefty, MD  zolpidem (AMBIEN) 10 MG tablet Take 1 tablet (10 mg total) by mouth at bedtime as needed for sleep. 04/28/19  Yes Sowles, Danna Hefty, MD  albuterol (VENTOLIN HFA) 108 (90 Base) MCG/ACT inhaler Inhale 2 puffs into the lungs every 6 (six) hours as needed for wheezing or shortness of breath. Patient not taking: Reported on 05/17/2019 12/01/18   Salena Saner, MD  aspirin 325 MG EC tablet Take 1 tablet (325 mg total) by mouth daily. Patient not taking: Reported on 05/17/2019 05/11/18   Alba Cory, MD  HYDROcodone-acetaminophen (NORCO/VICODIN) 5-325 MG tablet Take 1  tablet by mouth every 4 (four) hours as needed for moderate pain. 05/31/19 05/30/20  Earline Mayotte, MD    Physical Exam: Vitals:   05/31/19 1515 05/31/19 1615 05/31/19 1715 05/31/19 1735  BP: (!) 126/57 131/75 126/77   Pulse: 97 (!) 104 99 (!) 117  Resp: 16 13 18  (!) 23  Temp:    99.2 F (37.3 C)  TempSrc:      SpO2: 96% 95% 96% 96%  Weight:      Height:        Constitutional: Drowsy but awake, no acute distress,  Eyes: EOMI, lids and conjunctivae normal ENMT: Mucous membranes are moist. Normal dentition.  Hearing grossly normal. Respiratory: CTAB, no wheezing, no crackles. Normal respiratory effort. No accessory muscle use.  Cardiovascular: RRR, no murmurs / rubs / gallops. No extremity edema. 2+ pedal pulses. No carotid bruits.  Abdomen: soft, NT, ND, no masses or HSM palpated. +Bowel sounds.  Musculoskeletal: no clubbing / cyanosis. No joint deformity upper and lower extremities. Normal muscle tone.  Skin: dry, intact, normal color, normal temperature Neurologic: CN 2-12 grossly intact.  Speech is delayed with very mild slurring but coherent, grip strength is nearly equal but slightly weaker on the left Psychiatric: Alert and oriented x 3. Normal mood. Congruent affect.  Normal judgement and insight.    Labs on Admission: I have personally reviewed following labs and imaging studies  CBC: Recent Labs  Lab 05/31/19 1657  WBC 9.4  NEUTROABS 8.7*  HGB 13.1  HCT 38.3  MCV 89.3  PLT 235   Basic Metabolic Panel: Recent Labs  Lab 05/31/19 1657  NA 138  K 4.3  CL 106  CO2 23  GLUCOSE 156*  BUN 12  CREATININE 0.94  CALCIUM 8.9  MG 2.2   GFR: Estimated Creatinine Clearance: 86.2 mL/min (by C-G formula based on SCr of 0.94 mg/dL). Liver Function Tests: Recent Labs  Lab 05/31/19 1657  AST 28  ALT 23  ALKPHOS 66  BILITOT 0.8  PROT 6.8  ALBUMIN 3.9   No results for input(s): LIPASE, AMYLASE in the last 168 hours. No results for input(s): AMMONIA in the  last 168 hours. Coagulation Profile: No results for input(s): INR, PROTIME in the last 168 hours. Cardiac Enzymes: No results for input(s): CKTOTAL, CKMB, CKMBINDEX, TROPONINI in the last 168 hours. BNP (last 3 results) No results for input(s): PROBNP in the last 8760 hours. HbA1C: No results for input(s): HGBA1C in the last 72 hours. CBG: Recent Labs  Lab 05/31/19 1149  GLUCAP 149*   Lipid Profile: No results for input(s): CHOL, HDL, LDLCALC, TRIG, CHOLHDL, LDLDIRECT in the last 72 hours. Thyroid Function Tests: Recent Labs    05/31/19 1657  TSH 0.561  Anemia Panel: No results for input(s): VITAMINB12, FOLATE, FERRITIN, TIBC, IRON, RETICCTPCT in the last 72 hours. Urine analysis:    Component Value Date/Time   COLORURINE YELLOW (A) 09/27/2017 2327   APPEARANCEUR CLEAR (A) 09/27/2017 2327   LABSPEC 1.019 09/27/2017 2327   PHURINE 5.0 09/27/2017 2327   GLUCOSEU NEGATIVE 09/27/2017 2327   HGBUR SMALL (A) 09/27/2017 2327   BILIRUBINUR NEGATIVE 09/27/2017 2327   KETONESUR NEGATIVE 09/27/2017 2327   PROTEINUR NEGATIVE 09/27/2017 2327   NITRITE NEGATIVE 09/27/2017 2327   LEUKOCYTESUR NEGATIVE 09/27/2017 2327    Radiological Exams on Admission: DG Cholangiogram Operative  Result Date: 05/31/2019 CLINICAL DATA:  49 year old female with a history of cholelithiasis EXAM: INTRAOPERATIVE CHOLANGIOGRAM TECHNIQUE: Cholangiographic images from the C-arm fluoroscopic device were submitted for interpretation post-operatively. Please see the procedural report for the amount of contrast and the fluoroscopy time utilized. COMPARISON:  05/05/2019, 05/08/2019 FINDINGS: Surgical instruments project over the upper abdomen. There is cannulation of the cystic duct/gallbladder neck, with antegrade infusion of contrast. Caliber of the extrahepatic ductal system within normal limits. No definite filling defect within the extrahepatic ducts identified. Free flow of contrast across the ampulla.  IMPRESSION: Intraoperative cholangiogram demonstrates extrahepatic biliary ducts of unremarkable caliber, with no definite filling defects identified. Free flow of contrast across the ampulla. Please refer to the dictated operative report for full details of intraoperative findings and procedure Electronically Signed   By: Gilmer Mor D.O.   On: 05/31/2019 11:21     Assessment/Plan Principal Problem:   Seizure-like activity (HCC) Active Problems:   Psychogenic nonepileptic seizure   History of stroke   Abdominal pain   Degenerative disc disease, lumbar   Chronic pain syndrome   Sleep apnea   Major depression (HCC)   Gastric reflux   Overweight   Vitamin D deficiency   GAD (generalized anxiety disorder)   Migraine without aura and without status migrainosus, not intractable   Hyperlipidemia    Seizure-like activity -patient had episode in PACU of bilateral upper extremity shaking and apparent loss of consciousness and subsequently postictal.  Husband reports this is consistent with her prior episodes.  She has been evaluated for epileptic seizures with no definitive diagnosis.  Episodes are consistent with psychogenic nonepileptic seizure-like activity.  Due to patient's history of stroke however neurology recommended admission for observation and EEG. --Follow-up EEG --Neurology following --Seizure precautions --Neurochecks  History of psychogenic nonepileptic seizures - mgmt as above  History of strokes - with residual mild left-sided weakness.  Patient suffered strokes in 2017 and again in 2018.  She reports intermittent spells of delayed speech or expressive aphasia, occasional worsening left-sided weakness.  These usually occur after her seizure-like episodes.  Patient does not appear to have any new onset focal neurologic deficits --mgmt as above --PT eval tomorrow  Abdominal pain - s/p lap chole today.   --pain control and antiemetics as needed  Degenerative disc  disease, lumbar Chronic pain syndrome --continue home med regimen with PRN pain meds for post-op pain  Sleep apnea - CPAP ordered    --Resume home medications once med history complete: Major depression  Gastric reflux  Vitamin D deficiency  GAD (generalized anxiety disorder)  Migraine without aura and without status migrainosus, not intractable  Hyperlipidemia  Overweight    DVT prophylaxis: Lovenox Code Status: Full Family Communication: Spoke with husband by phone, updated and all questions answered.  He is agreeable to plan Disposition Plan: Expect discharge home in 24 to 48 hours pending clearance by  neurology Consults called: Neurology Admission status: obs    Pennie Banter, DO [facility]   If 7PM-7AM, please contact night-coverage [email or web address]  05/31/2019, 5:49 PM

## 2019-05-31 NOTE — Progress Notes (Signed)
Wanting to see husband   Alicia [name] she hurts all over

## 2019-05-31 NOTE — Transfer of Care (Signed)
Immediate Anesthesia Transfer of Care Note  Patient: Karessa Onorato Note [name]  Procedure(s) Performed: LAPAROSCOPIC CHOLECYSTECTOMY WITH INTRAOPERATIVE CHOLANGIOGRAM (N/A ) ESOPHAGOGASTRODUODENOSCOPY (EGD) (N/A )  Patient Location: PACU  Anesthesia Type:General  Level of Consciousness: drowsy and responds to stimulation  Airway & Oxygen Therapy: Patient Spontanous Breathing and Patient connected to face mask oxygen  Post-op Assessment: Report given to RN and Post -op Vital signs reviewed and stable  Post vital signs: Reviewed and stable  Last Vitals:  Vitals Value Taken Time  BP 110/41 05/31/19 1044  Temp 36.3 C 05/31/19 1044  Pulse 114 05/31/19 1044  Resp 18 05/31/19 1044  SpO2 99 % 05/31/19 1044    Last Pain:  Vitals:   05/31/19 1044  TempSrc:   PainSc: Asleep         Complications: No apparent anesthesia complications

## 2019-05-31 NOTE — Anesthesia Preprocedure Evaluation (Addendum)
Anesthesia Evaluation  Patient identified by MRN, date of birth, ID band Patient awake    Reviewed: Allergy & Precautions, NPO status , Patient's Chart, lab work & pertinent test results  History of Anesthesia Complications Negative for: history of anesthetic complications  Airway Mallampati: II  TM Distance: >3 FB Neck ROM: Full    Dental no notable dental hx. (+) Teeth Intact, Dental Advisory Given   Pulmonary sleep apnea and Continuous Positive Airway Pressure Ventilation , neg COPD, Patient abstained from smoking.Not current smoker,    Pulmonary exam normal breath sounds clear to auscultation       Cardiovascular Exercise Tolerance: Good METShypertension, + DOE  (-) CAD and (-) Past MI (-) dysrhythmias  Rhythm:Regular Rate:Normal - Systolic murmurs Loop recorder in place to evaluate for dysrythmias after stroke. None found so far. \ TTE 2020:   1. Left ventricular ejection fraction, by visual estimation, is 60 to  65%. The left ventricle has normal function. Normal left ventricular size.  There is no left ventricular hypertrophy.  2. Left ventricular diastolic Doppler parameters are consistent with  pseudonormalization pattern of LV diastolic filling.  3. Global right ventricle has normal systolic function.The right  ventricular size is normal. No increase in right ventricular wall  thickness.  4. Left atrial size was normal.  5. TR signal is inadequate for assessing pulmonary artery systolic  pressure.    Neuro/Psych  Headaches, PSYCHIATRIC DISORDERS Anxiety Depression Residual left sided weakness, "mental fogginess" CVA, Residual Symptoms    GI/Hepatic GERD  Poorly Controlled and Medicated,(+)     (-) substance abuse  ,   Endo/Other  neg diabetes  Renal/GU negative Renal ROS     Musculoskeletal   Abdominal   Peds  Hematology  (+) anemia ,   Anesthesia Other Findings Past Medical History: No  date: Anemia No date: Anxiety No date: Depression No date: Dyspnea     Comment:  SINCE STROKE No date: GERD (gastroesophageal reflux disease) No date: Hyperlipidemia No date: Hypertension No date: IBS (irritable bowel syndrome) No date: Migraine 01/19/2012: Previous cesarean delivery, delivered, with or without  mention of antepartum condition No date: Psychogenic nonepileptic seizure     Comment:  hx/notes 12/19/2016-LAST SEIZURE IN 2019-ON NO MEDS AS OF              05-24-19 No date: Restless leg syndrome No date: Sleep apnea     Comment:  USES CPAP 12/18/2016: Stroke (HCC)     Comment:  Acute arterial ischemic stroke, multifocal, posterior               circulation /notes 12/19/2016-LEFT SIDED WEAKNESS, MEMORY              FATIGUE AND TROUBLE FINDING HER WORDS [facility]  Reproductive/Obstetrics                            Anesthesia Physical Anesthesia Plan  ASA: III  Anesthesia Plan: General   Post-op Pain Management:    Induction: Intravenous, Rapid sequence and Cricoid pressure planned  PONV Risk Score and Plan: 4 or greater and Ondansetron, Dexamethasone and Midazolam  Airway Management Planned: Oral ETT  Additional Equipment: None  Intra-op Plan:   Post-operative Plan: Extubation in OR  Informed Consent: I have reviewed the patients History and Physical, chart, labs and discussed the procedure including the risks, benefits and alternatives for the proposed anesthesia with the patient or authorized representative who has indicated his/her understanding  and acceptance.     Dental advisory given  Plan Discussed with: CRNA and Surgeon  Anesthesia Plan Comments: (Discussed risks of anesthesia with patient, including PONV, sore throat, lip/dental damage. Rare risks discussed as well, such as cardiorespiratory and neurological sequelae. Patient understands.)        Anesthesia Quick Evaluation

## 2019-05-31 NOTE — Addendum Note (Signed)
Addendum  created 05/31/19 1210 by Corinda Gubler, MD   Clinical Note Signed

## 2019-05-31 NOTE — Progress Notes (Signed)
CR CARROLL IN TO SEE PT     Shaky and hot   Does respond when spoken to

## 2019-05-31 NOTE — OR Nursing (Signed)
May resume aspirin today per Dr. Lemar Livings secure chat, added to discharge instructions, medication section.

## 2019-05-31 NOTE — Progress Notes (Signed)
Pt husband called  Pt will be admitted and EEG to be performed    Dr Lemar Livings called and made aware

## 2019-05-31 NOTE — Progress Notes (Signed)
On or around 1120 Dr Marnee Spring made aware of patients constant coughing. Pt states she is not nausea states she needs some tum's. Dr Marnee Spring at bedside shortly after patient given 15 ml of sodium citrate.

## 2019-05-31 NOTE — Progress Notes (Signed)
RN had stepped out for less than 5 minutes to call MD for pain medication. Pt. was told the reason for departure and she acknowledged. Upon return of nurse pt. Was lying on left side over the rail and both feet on floor. When speaking to pt. She did not respond. RN Arie Sabina told pt. If she "did not wake up I am unable to give you pain medication for safety reasons". The patient immediately opened eyes and said " what happened". VSS and pt. In no physical distress. Pt. Repositioned in bed for safety reasons. The pt. received medication.

## 2019-05-31 NOTE — Progress Notes (Addendum)
Called to PACU for patient having retching. Patient said she has pain in her incisional site, as well as a difficulty time coughing up "phlegm". Patient seemed agitated, tachycardic. Ordered fentanyl to be given for pain.   Called again to PACU shortly after for report of patient seizing. No opioid had been given yet. When I arrived, patient was lying in bed, without convulsions, moving air spontaneously, hemodynamically stable. Fingerstick glucose normal. Patient with hx of stroke and possible seizures.   Will consult neurology for recommendations.  Addendum 12:42 Spoke with neurologist Dr Hassie Bruce (spelling?) about this patient. He will evaluate the patient today.

## 2019-05-31 NOTE — Progress Notes (Signed)
EEC done  Up to [facility]    Husband in to see pt

## 2019-05-31 NOTE — Progress Notes (Signed)
Dr Cheryle Horsfall in to see pt

## 2019-05-31 NOTE — Progress Notes (Signed)
eeg completed ° °

## 2019-05-31 NOTE — Progress Notes (Signed)
Pt became unresponsive on or around 1130. Pt noted to become rigid with arms flexed upward with fist clinched. Called out her name three times but no response O2 sats drop to the low 80's. Called Dr. Suzan Slick for assessment. Pt became relaxed within a minutes time. Simple mask applied on 7 liters sats are 98%. At about 5 min after becoming unresponsive patient was able to tell us where she was but did not remember what happened. Dr Suzan Slick called with blood sugar results and neuro consult has been ordered.

## 2019-05-31 NOTE — Progress Notes (Signed)
Pt responsive to verbal stimulus     Crying  Will answer some questions   Vital signs stable  Dr Noralyn Pick in with pt   Shaky and straights arms out    [employer] called   No new orders at present

## 2019-05-31 NOTE — Consult Note (Signed)
Reason for Consult: seizure activity   CC: seizure activity  HPI: Alicia [name] is an 49 y.o. female with medical history significant of stroke with residual left-sided weakness, migraine, sleep apnea, iron deficiency anemia, lumbar degenerative disc disease, kidney stones, insomnia, depression with anxiety and history of nonepileptic seizure-like activity.  Patient underwent laparoscopic cholecystectomy today which was uncomplicated.  However, in PACU patient was witnessed to have seizure-like activity with shaking of the bilateral upper extremities and loss of consciousness. Patient had multiple brief generalized seizure activity which would be brief and short post ictal state.     Past Medical History:  Diagnosis Date  . Anemia   . Anxiety   . Depression   . Dyspnea    SINCE STROKE  . GERD (gastroesophageal reflux disease)   . Hyperlipidemia   . Hypertension   . IBS (irritable bowel syndrome)   . Migraine   . Previous cesarean delivery, delivered, with or without mention of antepartum condition 01/19/2012  . Psychogenic nonepileptic seizure    hx/notes 12/19/2016-LAST SEIZURE IN 2019-ON NO MEDS AS OF 05-24-19  . Restless leg syndrome   . Sleep apnea    USES CPAP  . Stroke (HCC) 12/18/2016   Acute arterial ischemic stroke, multifocal, posterior circulation /notes 12/19/2016-LEFT SIDED WEAKNESS, MEMORY FATIGUE AND TROUBLE FINDING HER WORDS [facility]    Past Surgical History:  Procedure Laterality Date  . ABDOMINOPLASTY  Feb. 2015  . CESAREAN SECTION  2011  . COLONOSCOPY WITH PROPOFOL N/A 11/26/2016   Procedure: COLONOSCOPY WITH PROPOFOL;  Surgeon: Wyline Mood, MD;  Location: [facility] ENDOSCOPY;  Service: Gastroenterology;  Laterality: N/A;  . CYSTOSCOPY  02/02/2015   Procedure: CYSTOSCOPY;  Surgeon: Nadara Mustard, MD;  Location: [facility] ORS;  Service: Gynecology;;  . Joya Gaskins AND CURETTAGE OF UTERUS  2003, 2005, 2008  . ESOPHAGOGASTRODUODENOSCOPY (EGD) WITH PROPOFOL N/A 11/26/2016    Procedure: ESOPHAGOGASTRODUODENOSCOPY (EGD) WITH PROPOFOL;  Surgeon: Wyline Mood, MD;  Location: [facility] ENDOSCOPY;  Service: Gastroenterology;  Laterality: N/A;  . HERNIA REPAIR  1999   Umbilical  . KNEE ARTHROSCOPY Right 2006  . LAPAROSCOPIC BILATERAL SALPINGECTOMY Bilateral 02/02/2015   Procedure: LAPAROSCOPIC BILATERAL SALPINGECTOMY;  Surgeon: Nadara Mustard, MD;  Location: [facility] ORS;  Service: Gynecology;  Laterality: Bilateral;  . LAPAROSCOPIC HYSTERECTOMY N/A 02/02/2015   Procedure: HYSTERECTOMY TOTAL LAPAROSCOPIC;  Surgeon: Nadara Mustard, MD;  Location: [facility] ORS;  Service: Gynecology;  Laterality: N/A;  . LOOP RECORDER INSERTION N/A 12/20/2016   Procedure: LOOP RECORDER INSERTION;  Surgeon: Regan Lemming, MD;  Location: [facility] INVASIVE CV LAB;  Service: Cardiovascular;  Laterality: N/A;  . REDUCTION MAMMAPLASTY Bilateral December 2015  . TEE WITHOUT CARDIOVERSION N/A 12/20/2016   Procedure: TRANSESOPHAGEAL ECHOCARDIOGRAM (TEE);  Surgeon: Lars Masson, MD;  Location: [facility] ENDOSCOPY;  Service: Cardiovascular;  Laterality: N/A;    Family History  Problem Relation Age of Onset  . Hypertension Mother   . Hyperlipidemia Mother   . Heart Problems Father        hole in heart and lower ventricles reversed  . Prostate cancer Maternal Grandfather   . Von Willebrand disease Maternal Uncle     Social History:  reports that she has never smoked. She has never used smokeless tobacco. She reports that she does not drink alcohol or use drugs.  Allergies  Allergen Reactions  . Azithromycin Diarrhea  . Penicillins Hives and Swelling    Has patient had a PCN reaction causing immediate rash, facial/tongue/throat swelling, SOB or lightheadedness with hypotension:  Yes Has patient had a PCN reaction causing severe rash involving mucus membranes or skin necrosis: No Has patient had a PCN reaction that required hospitalization No Has patient had a PCN reaction occurring within the last 10 years:  No If all of the above answers are "NO", then may proceed with Cephalosporin use.    Medications: I have reviewed the patient's current medications.  ROS: History obtained from the patient  General ROS: negative for - chills, fatigue, fever, night sweats, weight gain or weight loss Psychological ROS: negative for - behavioral disorder, hallucinations, memory difficulties, mood swings or suicidal ideation Ophthalmic ROS: negative for - blurry vision, double vision, eye pain or loss of vision ENT ROS: negative for - epistaxis, nasal discharge, oral lesions, sore throat, tinnitus or vertigo Allergy and Immunology ROS: negative for - hives or itchy/watery eyes Hematological and Lymphatic ROS: negative for - bleeding problems, bruising or swollen lymph nodes Endocrine ROS: negative for - galactorrhea, hair pattern changes, polydipsia/polyuria or temperature intolerance Respiratory ROS: negative for - cough, hemoptysis, shortness of breath or wheezing Cardiovascular ROS: negative for - chest pain, dyspnea on exertion, edema or irregular heartbeat Gastrointestinal ROS: negative for - abdominal pain, diarrhea, hematemesis, nausea/vomiting or stool incontinence Genito-Urinary ROS: negative for - dysuria, hematuria, incontinence or urinary frequency/urgency Musculoskeletal ROS: negative for - joint swelling or muscular weakness Neurological ROS: as noted in HPI Dermatological ROS: negative for rash and skin lesion changes  Physical Examination: Blood pressure (!) 126/57, pulse 97, temperature (!) 97.2 F (36.2 C), resp. rate 16, height 5\' 8"  (1.727 m), weight 90.7 kg, last menstrual period 01/15/2015, SpO2 96 %.    Neurological Examination   Mental Status: Alert, oriented, thought content appropriate.  Cranial Nerves: II: Discs flat bilaterally; Visual fields grossly normal, pupils equal, round, reactive to light and accommodation III,IV, VI: ptosis not present, extra-ocular motions intact  bilaterally V,VII: smile symmetric, facial light touch sensation normal bilaterally VIII: hearing normal bilaterally IX,X: gag reflex present XI: bilateral shoulder shrug XII: midline tongue extension Motor: Right : Upper extremity   5/5    Left:     Upper extremity   4+/5  Lower extremity   5/5     Lower extremity   4+/5 Tone and bulk:normal tone throughout; no atrophy noted Sensory: Pinprick and light touch intact throughout, bilaterally Deep Tendon Reflexes: 1+ and symmetric throughout Plantars: Right: downgoing   Left: downgoing Cerebellar: normal finger-to-nose      Laboratory Studies:   Basic Metabolic Panel: No results for input(s): NA, K, CL, CO2, GLUCOSE, BUN, CREATININE, CALCIUM, MG, PHOS in the last 168 hours.  Liver Function Tests: No results for input(s): AST, ALT, ALKPHOS, BILITOT, PROT, ALBUMIN in the last 168 hours. No results for input(s): LIPASE, AMYLASE in the last 168 hours. No results for input(s): AMMONIA in the last 168 hours.  CBC: No results for input(s): WBC, NEUTROABS, HGB, HCT, MCV, PLT in the last 168 hours.  Cardiac Enzymes: No results for input(s): CKTOTAL, CKMB, CKMBINDEX, TROPONINI in the last 168 hours.  BNP: Invalid input(s): POCBNP  CBG: Recent Labs  Lab 05/31/19 1149  GLUCAP 149*    Microbiology: Results for orders placed or performed during the hospital encounter of 05/27/19  SARS CORONAVIRUS 2 (TAT 6-24 HRS) Nasopharyngeal Nasopharyngeal Swab     Status: None   Collection Time: 05/27/19  1:21 PM   Specimen: Nasopharyngeal Swab  Result Value Ref Range Status   SARS Coronavirus 2 NEGATIVE NEGATIVE Final    Comment: (  NOTE) SARS-CoV-2 target nucleic acids are NOT DETECTED. The SARS-CoV-2 RNA is generally detectable in upper and lower respiratory specimens during the acute phase of infection. Negative results do not preclude SARS-CoV-2 infection, do not rule out co-infections with other pathogens, and should not be used as  the sole basis for treatment or other patient management decisions. Negative results must be combined with clinical observations, patient history, and epidemiological information. The expected result is Negative. Fact Sheet for Patients: HairSlick.no Fact Sheet for Healthcare Providers: quierodirigir.com This test is not yet approved or cleared by the [place] FDA and  has been authorized for detection and/or diagnosis of SARS-CoV-2 by FDA under an Emergency Use Authorization (EUA). This EUA will remain  in effect (meaning this test can be used) for the duration of the COVID-19 declaration under Section 56 4(b)(1) of the Act, 21 U.S.C. section 360bbb-3(b)(1), unless the authorization is terminated or revoked sooner. Performed at [facility], [address], [place], [place] [address]     Coagulation Studies: No results for input(s): LABPROT, INR in the last 72 hours.  Urinalysis: No results for input(s): COLORURINE, LABSPEC, PHURINE, GLUCOSEU, HGBUR, BILIRUBINUR, KETONESUR, PROTEINUR, UROBILINOGEN, NITRITE, LEUKOCYTESUR in the last 168 hours.  Invalid input(s): APPERANCEUR  Lipid Panel:     Component Value Date/Time   CHOL 193 05/04/2019 0929   CHOL 161 09/12/2017 0838   TRIG 305 (H) 05/04/2019 0929   HDL 34 (L) 05/04/2019 0929   HDL 43 09/12/2017 0838   CHOLHDL 5.7 (H) 05/04/2019 0929   VLDL 76 (H) 12/19/2016 0009   LDLCALC 118 (H) 05/04/2019 0929    HgbA1C:  Lab Results  Component Value Date   HGBA1C 5.7 (H) 05/04/2019    Urine Drug Screen:      Component Value Date/Time   LABOPIA NONE DETECTED 09/27/2017 2327   LABOPIA POSITIVE (A) 12/19/2016 0310   COCAINSCRNUR NONE DETECTED 09/27/2017 2327   LABBENZ TEST NOT PERFORMED, REAGENT NOT AVAILABLE (A) 09/27/2017 2327   LABBENZ POSITIVE (A) 12/19/2016 0310   AMPHETMU NONE DETECTED 09/27/2017 2327   AMPHETMU NONE DETECTED 12/19/2016 0310   THCU NONE  DETECTED 09/27/2017 2327   THCU NONE DETECTED 12/19/2016 0310   LABBARB NONE DETECTED 09/27/2017 2327   LABBARB NONE DETECTED 12/19/2016 0310    Alcohol Level: No results for input(s): ETH in the last 168 hours.  Other results: EKG: normal EKG, normal sinus rhythm, unchanged from previous tracings.  Imaging: DG Cholangiogram Operative  Result Date: 05/31/2019 CLINICAL DATA:  49 year old female with a history of cholelithiasis EXAM: INTRAOPERATIVE CHOLANGIOGRAM TECHNIQUE: Cholangiographic images from the C-arm fluoroscopic device were submitted for interpretation post-operatively. Please see the procedural report for the amount of contrast and the fluoroscopy time utilized. COMPARISON:  05/05/2019, 05/08/2019 FINDINGS: Surgical instruments project over the upper abdomen. There is cannulation of the cystic duct/gallbladder neck, with antegrade infusion of contrast. Caliber of the extrahepatic ductal system within normal limits. No definite filling defect within the extrahepatic ducts identified. Free flow of contrast across the ampulla. IMPRESSION: Intraoperative cholangiogram demonstrates extrahepatic biliary ducts of unremarkable caliber, with no definite filling defects identified. Free flow of contrast across the ampulla. Please refer to the dictated operative report for full details of intraoperative findings and procedure Electronically Signed   By: Gilmer Mor D.O.   On: 05/31/2019 11:21     Assessment/Plan:  49 y.o. female with medical history significant of stroke with residual left-sided weakness, migraine, sleep apnea, iron deficiency anemia, lumbar degenerative disc disease,  kidney stones, insomnia, depression with anxiety and history of nonepileptic seizure-like activity.  Patient underwent laparoscopic cholecystectomy today which was uncomplicated.  However, in PACU patient was witnessed to have seizure-like activity with shaking of the bilateral upper extremities and loss of  consciousness. Patient had multiple brief generalized seizure activity which would be brief and short post ictal state.    - On further discussion with the patient she states she had seizure like activity post her stroke in 2018, was given Lamictal. After short time it was d/c as seizures were attributed to anxiety and stress.  - Today similar episodes short and minimal post ictal state - Suspect this is non epileptic in nature - EEG being done - Observation overnight and likely d/c in AM - no anti epileptics at this time.  - would hold off any imaging 05/31/2019, 4:02 PM

## 2019-05-31 NOTE — Anesthesia Procedure Notes (Signed)
Procedure Name: Intubation Performed by: Mohammed Kindle, CRNA Pre-anesthesia Checklist: Patient identified, Emergency Drugs available, Suction available and Patient being monitored Patient Re-evaluated:Patient Re-evaluated prior to induction Oxygen Delivery Method: Circle system utilized Preoxygenation: Pre-oxygenation with 100% oxygen Induction Type: IV induction and Rapid sequence Laryngoscope Size: McGraph and 3 Grade View: Grade I Tube type: Oral Number of attempts: 1 Airway Equipment and Method: Stylet Placement Confirmation: positive ETCO2,  CO2 detector,  breath sounds checked- equal and bilateral and ETT inserted through vocal cords under direct vision Secured at: 21 cm Tube secured with: Tape Dental Injury: Teeth and Oropharynx as per pre-operative assessment

## 2019-05-31 NOTE — Progress Notes (Signed)
States she has 6 out of 10 pain across upper abd  Oxycodone 5gmg given

## 2019-05-31 NOTE — Discharge Instructions (Addendum)
AMBULATORY SURGERY  DISCHARGE INSTRUCTIONS   1) The drugs that you were given will stay in your system until tomorrow so for the next 24 hours you should not:  A) Drive an automobile B) Make any legal decisions C) Drink any alcoholic beverage   2) You may resume regular meals tomorrow.  Today it is better to start with liquids and gradually work up to solid foods.  You may eat anything you prefer, but it is better to start with liquids, then soup and crackers, and gradually work up to solid foods.   3) Please notify your doctor immediately if you have any unusual bleeding, trouble breathing, redness and pain at the surgery site, drainage, fever, or pain not relieved by medication.    4) Additional Instructions:        Please contact your physician with any problems or Same Day Surgery at [phone], Monday through Friday 6 am to 4 pm, or [place] [facility] number at [phone].    Cholecystitis  Cholecystitis is irritation and swelling (inflammation) of the gallbladder. The gallbladder is an organ that is shaped like a pear. It is under the liver on the right side of the body. This organ stores bile. Bile helps the body break down (digest) the fats in food. This condition can occur all of a sudden. It needs to be treated. What are the causes? This condition may be caused by stones or lumps that form in the gallbladder (gallstones). Gallstones can block the tube (duct) that carries bile out of your gallbladder. Other causes are:  Damage to the gallbladder due to less blood flow.  Germs in the bile ducts.  Scars or kinks in the bile ducts.  Abnormal growths (tumors) in the liver, pancreas, or gallbladder. What increases the risk? You are more likely to develop this condition if:  You have sickle cell disease.  You take birth control pills.  You use estrogen.  You have alcoholic liver disease.  You have liver cirrhosis.  You are being fed  through a vein.  You are very ill.  You do not eat or drink for a long time. This is also called "fasting."  You are overweight (obese).  You lose weight too fast.  You are pregnant.  You have high levels of fat in the blood (triglycerides).  You have irritation and swelling of the pancreas (pancreatitis). What are the signs or symptoms? Symptoms of this condition include:  Pain in the belly (abdomen). Pain is often in the upper right area of the belly.  Tenderness or bloating in the belly.  Feeling sick to your stomach (nauseous).  Throwing up (vomiting).  Fever.  Chills. How is this diagnosed? This condition may be diagnosed with a medical history and exam. You may also have other tests, such as:  Imaging tests. This may include: ? Ultrasound. ? CT scan of the belly. ? Nuclear scan. This is also called a HIDA scan. This scan lets your doctor see the bile as it moves in your body. ? MRI.  Blood tests. These are done to check: ? Your blood count. The white blood cell count may be higher than normal. ? How well your liver works. How is this treated? This condition may be treated with:  Surgery to take out your gallbladder.  Antibiotic medicines to treat illnesses caused by germs.  Going without food for some time.  Giving fluids through an IV tube.  Medicines to treat pain or throwing up. Follow  these instructions at home:  If you had surgery, follow instructions from your doctor about how to care for yourself after you go home. Medicines   Take over-the-counter and prescription medicines only as told by your doctor.  If you were prescribed an antibiotic medicine, take it as told by your doctor. Do not stop taking it even if you start to feel better. General instructions  Follow instructions from your doctor about what to eat or drink. Do not eat or drink anything that makes you sick again.  Do not lift anything that is heavier than 10 lb (4.5 kg)  until your doctor says that it is safe.  Do not use any products that contain nicotine or tobacco, such as cigarettes and e-cigarettes. If you need help quitting, ask your doctor.  Keep all follow-up visits as told by your doctor. This is important. Contact a doctor if:  You have pain and your medicine does not help.  You have a fever. Get help right away if:  Your pain moves to: ? Another part of your belly. ? Your back.  Your symptoms do not go away.  You have new symptoms. Summary  Cholecystitis is swelling and irritation of the gallbladder.  This condition may be caused by stones or lumps that form in the gallbladder (gallstones).  Common symptoms are pain in the belly. You may feel sick to your stomach and start throwing up. You may also have a fever and chills.  This condition may be treated with surgery to take out the gallbladder. It may also be treated with medicines, fasting, and fluids through an IV tube.  Follow what you are told about eating and drinking. Do not eat things that make you sick again. This information is not intended to replace advice given to you by your health care provider. Make sure you discuss any questions you have with your health care provider. Document Revised: 06/13/2017 Document Reviewed: 06/13/2017 Elsevier Patient Education  2020 [employer]   Gallbladder Eating Plan If you have a gallbladder condition, you may have trouble digesting fats. Eating a low-fat diet can help reduce your symptoms, and may be helpful before and after having surgery to remove your gallbladder (cholecystectomy). Your health care provider may recommend that you work with a diet and nutrition specialist (dietitian) to help you reduce the amount of fat in your diet. What are tips for following this plan? General guidelines  Limit your fat intake to less than 30% of your total daily calories. If you eat around 1,800 calories each day, this is less than 60 grams  (g) of fat per day.  Fat is an important part of a healthy diet. Eating a low-fat diet can make it hard to maintain a healthy body weight. Ask your dietitian how much fat, calories, and other nutrients you need each day.  Eat small, frequent meals throughout the day instead of three large meals.  Drink at least 8-10 cups of fluid a day. Drink enough fluid to keep your urine clear or pale yellow.  Limit alcohol intake to no more than 1 drink a day for nonpregnant women and 2 drinks a day for men. One drink equals 12 oz of beer, 5 oz of wine, or 1 oz of hard liquor. Reading food labels  Check Nutrition Facts on food labels for the amount of fat per serving. Choose foods with less than 3 grams of fat per serving. Shopping  Choose nonfat and low-fat healthy foods. Look for the  words "nonfat," "low fat," or "fat free."  Avoid buying processed or prepackaged foods. Cooking  Cook using low-fat methods, such as baking, broiling, grilling, or boiling.  Cook with small amounts of healthy fats, such as olive oil, grapeseed oil, canola oil, or sunflower oil. What foods are recommended?   All fresh, frozen, or canned fruits and vegetables.  Whole grains.  Low-fat or non-fat (skim) milk and yogurt.  Lean meat, skinless poultry, fish, eggs, and beans.  Low-fat protein supplement powders or drinks.  Spices and herbs. What foods are not recommended?  High-fat foods. These include baked goods, fast food, fatty cuts of meat, ice cream, french toast, sweet rolls, pizza, cheese bread, foods covered with butter, creamy sauces, or cheese.  Fried foods. These include french fries, tempura, battered fish, breaded chicken, fried breads, and sweets.  Foods with strong odors.  Foods that cause bloating and gas. Summary  A low-fat diet can be helpful if you have a gallbladder condition, or before and after gallbladder surgery.  Limit your fat intake to less than 30% of your total daily calories.  This is about 60 g of fat if you eat 1,800 calories each day.  Eat small, frequent meals throughout the day instead of three large meals. This information is not intended to replace advice given to you by your health care provider. Make sure you discuss any questions you have with your health care provider. Document Revised: 05/28/2018 Document Reviewed: 03/14/2016 Elsevier Patient Education  [place].

## 2019-05-31 NOTE — H&P (Signed)
Alicia [name] [id number] Dec 10, 1970     HPI:  Patient with RUQ pain radiating to the back and epigastrium. CT showed a gallstone.  Awakened patient from sleep.  MRI suggests a small pancreatic area for which endo ultrasound may be completed in the future.  Marked reflux w/o heartburn. Barium swallow showed normal motility.  For cholecystectomy and EGD.   Medications Prior to Admission  Medication Sig Dispense Refill Last Dose  . ALPRAZolam (XANAX XR) 1 MG 24 hr tablet Take 1 tablet (1 mg total) by mouth daily. (Patient taking differently: Take 1 mg by mouth every morning. ) 30 tablet 2 05/31/2019 at Unknown time  . aspirin EC 81 MG tablet Take 81 mg by mouth daily.   05/25/2019  . carvedilol (COREG) 3.125 MG tablet TAKE 1 TABLET(3.125 MG) BY MOUTH TWICE DAILY WITH A MEAL (Patient taking differently: Take 3.125 mg by mouth 2 (two) times daily with a meal. ) 180 tablet 1 05/31/2019 at Unknown time  . cyclobenzaprine (FLEXERIL) 10 MG tablet Take 1 tablet (10 mg total) by mouth at bedtime. (Patient taking differently: Take 10 mg by mouth every evening. ) 90 tablet 0 05/30/2019 at Unknown time  . icosapent Ethyl (VASCEPA) 1 g capsule Take 1 capsule (1 g total) by mouth 2 (two) times daily. (Patient taking differently: Take 1 g by mouth daily. ) 180 capsule 1 05/30/2019 at Unknown time  . OVER THE COUNTER MEDICATION Take 1 tablet by mouth daily. Previtalize     . OVER THE COUNTER MEDICATION Take 1 tablet by mouth daily. Provitalize     . pantoprazole (PROTONIX) 40 MG tablet Take 1 tablet (40 mg total) by mouth daily. (Patient taking differently: Take 40 mg by mouth every morning. ) 90 tablet 1 05/31/2019 at Unknown time  . PARoxetine (PAXIL) 20 MG tablet Take 3 tablets (60 mg total) by mouth every morning. (Patient taking differently: Take 60 mg by mouth every evening. ) 270 tablet 0 05/30/2019 at Unknown time  . rosuvastatin (CRESTOR) 20 MG tablet Take 1 tablet (20 mg total) by mouth daily. (Patient  taking differently: Take 20 mg by mouth at bedtime. ) 90 tablet 1 05/30/2019 at Unknown time  . UBRELVY 100 MG TABS Take 100 mg by mouth daily as needed (migraine).    Past Month at Unknown time  . Vitamin D, Ergocalciferol, (DRISDOL) 1.25 MG (50000 UNIT) CAPS capsule TAKE 1 CAPSULE BY MOUTH EVERY 7 DAYS (Patient taking differently: Take 50,000 Units by mouth every 7 (seven) days. ) 12 capsule 0 Past Week at Unknown time  . zolpidem (AMBIEN) 10 MG tablet Take 1 tablet (10 mg total) by mouth at bedtime as needed for sleep. 90 tablet 0 05/30/2019 at Unknown time  . albuterol (VENTOLIN HFA) 108 (90 Base) MCG/ACT inhaler Inhale 2 puffs into the lungs every 6 (six) hours as needed for wheezing or shortness of breath. (Patient not taking: Reported on 05/17/2019) 18 g 2 Not Taking at Unknown time  . aspirin 325 MG EC tablet Take 1 tablet (325 mg total) by mouth daily. (Patient not taking: Reported on 05/17/2019) 90 tablet 3 Not Taking at Unknown time   Allergies  Allergen Reactions  . Azithromycin Diarrhea  . Penicillins Hives and Swelling    Has patient had a PCN reaction causing immediate rash, facial/tongue/throat swelling, SOB or lightheadedness with hypotension: Yes Has patient had a PCN reaction causing severe rash involving mucus membranes or skin necrosis: No Has patient had a PCN reaction  that required hospitalization No Has patient had a PCN reaction occurring within the last 10 years: No If all of the above answers are "NO", then may proceed with Cephalosporin use.   Past Medical History:  Diagnosis Date  . Anemia   . Anxiety   . Depression   . Dyspnea    SINCE STROKE  . GERD (gastroesophageal reflux disease)   . Hyperlipidemia   . Hypertension   . IBS (irritable bowel syndrome)   . Migraine   . Previous cesarean delivery, delivered, with or without mention of antepartum condition 01/19/2012  . Psychogenic nonepileptic seizure    hx/notes 12/19/2016-LAST SEIZURE IN Jun 18, 2017 NO MEDS AS OF  05-24-19  . Restless leg syndrome   . Sleep apnea    USES CPAP  . Stroke (HCC) 12/18/2016   Acute arterial ischemic stroke, multifocal, posterior circulation /notes 12/19/2016-LEFT SIDED WEAKNESS, MEMORY FATIGUE AND TROUBLE FINDING HER WORDS [facility]   Past Surgical History:  Procedure Laterality Date  . ABDOMINOPLASTY  Feb. 2015  . CESAREAN SECTION  06-18-2009  . COLONOSCOPY WITH PROPOFOL N/A 11/26/2016   Procedure: COLONOSCOPY WITH PROPOFOL;  Surgeon: Wyline Mood, MD;  Location: [facility] ENDOSCOPY;  Service: Gastroenterology;  Laterality: N/A;  . CYSTOSCOPY  02/02/2015   Procedure: CYSTOSCOPY;  Surgeon: Nadara Mustard, MD;  Location: [facility] ORS;  Service: Gynecology;;  . Joya Gaskins AND CURETTAGE OF UTERUS  2003, 2005, 06/19/06  . ESOPHAGOGASTRODUODENOSCOPY (EGD) WITH PROPOFOL N/A 11/26/2016   Procedure: ESOPHAGOGASTRODUODENOSCOPY (EGD) WITH PROPOFOL;  Surgeon: Wyline Mood, MD;  Location: [facility] ENDOSCOPY;  Service: Gastroenterology;  Laterality: N/A;  . HERNIA REPAIR  1999   Umbilical  . KNEE ARTHROSCOPY Right 06/18/04  . LAPAROSCOPIC BILATERAL SALPINGECTOMY Bilateral 02/02/2015   Procedure: LAPAROSCOPIC BILATERAL SALPINGECTOMY;  Surgeon: Nadara Mustard, MD;  Location: [facility] ORS;  Service: Gynecology;  Laterality: Bilateral;  . LAPAROSCOPIC HYSTERECTOMY N/A 02/02/2015   Procedure: HYSTERECTOMY TOTAL LAPAROSCOPIC;  Surgeon: Nadara Mustard, MD;  Location: [facility] ORS;  Service: Gynecology;  Laterality: N/A;  . LOOP RECORDER INSERTION N/A 12/20/2016   Procedure: LOOP RECORDER INSERTION;  Surgeon: Regan Lemming, MD;  Location: [facility] INVASIVE CV LAB;  Service: Cardiovascular;  Laterality: N/A;  . REDUCTION MAMMAPLASTY Bilateral December 2015  . TEE WITHOUT CARDIOVERSION N/A 12/20/2016   Procedure: TRANSESOPHAGEAL ECHOCARDIOGRAM (TEE);  Surgeon: Lars Masson, MD;  Location: [facility] ENDOSCOPY;  Service: Cardiovascular;  Laterality: N/A;   Social History   Socioeconomic History  . Marital status: Married    Spouse  name: Jonny Ruiz   . Number of children: 4  . Years of education: Not on file  . Highest education level: Not on file  Occupational History  . Occupation: Agricultural engineer: [employer]  Tobacco Use  . Smoking status: Never Smoker  . Smokeless tobacco: Never Used  Substance and Sexual Activity  . Alcohol use: No    Alcohol/week: 0.0 standard drinks  . Drug use: No  . Sexual activity: Yes    Partners: Male  Other Topics Concern  . Not on file  Social History Narrative   First husband died suddenly in 2014/06/19   Remarried 06/07/2016   Social Determinants of Health   Financial Resource Strain:   . Difficulty of Paying Living Expenses:   Food Insecurity:   . Worried About Programme researcher, broadcasting/film/video in the Last Year:   . Barista in the Last Year:   Transportation Needs:   . Freight forwarder (Medical):   Marland Kitchen Lack  of Transportation (Non-Medical):   Physical Activity:   . Days of Exercise per Week:   . Minutes of Exercise per Session:   Stress:   . Feeling of Stress :   Social Connections:   . Frequency of Communication with Friends and Family:   . Frequency of Social Gatherings with Friends and Family:   . Attends Religious Services:   . Active Member of Clubs or Organizations:   . Attends Archivist Meetings:   Marland Kitchen Marital Status:   Intimate Partner Violence:   . Fear of Current or Ex-Partner:   . Emotionally Abused:   Marland Kitchen Physically Abused:   . Sexually Abused:    Social History   Social History Narrative   First husband died suddenly in 06-29-2014   Remarried 06/07/2016     ROS: Negative.     PE: HEENT: Negative. Lungs: Clear. Cardio: RR.  Assessment/Plan:  Proceed with planned cholecystectomy and EGD.    Forest Gleason [facility] 05/31/2019

## 2019-06-01 DIAGNOSIS — G2581 Restless legs syndrome: Secondary | ICD-10-CM | POA: Diagnosis not present

## 2019-06-01 DIAGNOSIS — E663 Overweight: Secondary | ICD-10-CM | POA: Diagnosis not present

## 2019-06-01 DIAGNOSIS — F445 Conversion disorder with seizures or convulsions: Secondary | ICD-10-CM | POA: Diagnosis not present

## 2019-06-01 DIAGNOSIS — R569 Unspecified convulsions: Secondary | ICD-10-CM | POA: Diagnosis not present

## 2019-06-01 DIAGNOSIS — I10 Essential (primary) hypertension: Secondary | ICD-10-CM | POA: Diagnosis not present

## 2019-06-01 DIAGNOSIS — F329 Major depressive disorder, single episode, unspecified: Secondary | ICD-10-CM | POA: Diagnosis not present

## 2019-06-01 DIAGNOSIS — G4730 Sleep apnea, unspecified: Secondary | ICD-10-CM | POA: Diagnosis not present

## 2019-06-01 DIAGNOSIS — E559 Vitamin D deficiency, unspecified: Secondary | ICD-10-CM | POA: Diagnosis not present

## 2019-06-01 DIAGNOSIS — K589 Irritable bowel syndrome without diarrhea: Secondary | ICD-10-CM | POA: Diagnosis not present

## 2019-06-01 DIAGNOSIS — R2689 Other abnormalities of gait and mobility: Secondary | ICD-10-CM | POA: Diagnosis not present

## 2019-06-01 DIAGNOSIS — K219 Gastro-esophageal reflux disease without esophagitis: Secondary | ICD-10-CM | POA: Diagnosis not present

## 2019-06-01 DIAGNOSIS — R400 Somnolence: Secondary | ICD-10-CM | POA: Diagnosis not present

## 2019-06-01 DIAGNOSIS — K8010 Calculus of gallbladder with chronic cholecystitis without obstruction: Secondary | ICD-10-CM | POA: Diagnosis not present

## 2019-06-01 DIAGNOSIS — G894 Chronic pain syndrome: Secondary | ICD-10-CM | POA: Diagnosis not present

## 2019-06-01 DIAGNOSIS — F411 Generalized anxiety disorder: Secondary | ICD-10-CM | POA: Diagnosis not present

## 2019-06-01 DIAGNOSIS — M6281 Muscle weakness (generalized): Secondary | ICD-10-CM | POA: Diagnosis not present

## 2019-06-01 DIAGNOSIS — Z8673 Personal history of transient ischemic attack (TIA), and cerebral infarction without residual deficits: Secondary | ICD-10-CM | POA: Diagnosis not present

## 2019-06-01 DIAGNOSIS — K317 Polyp of stomach and duodenum: Secondary | ICD-10-CM | POA: Diagnosis not present

## 2019-06-01 DIAGNOSIS — E785 Hyperlipidemia, unspecified: Secondary | ICD-10-CM | POA: Diagnosis not present

## 2019-06-01 LAB — CBC
HCT: 37.5 % (ref 36.0–46.0)
Hemoglobin: 13 g/dL (ref 12.0–15.0)
MCH: 31 pg (ref 26.0–34.0)
MCHC: 34.7 g/dL (ref 30.0–36.0)
MCV: 89.3 fL (ref 80.0–100.0)
Platelets: 248 10*3/uL (ref 150–400)
RBC: 4.2 MIL/uL (ref 3.87–5.11)
RDW: 13 % (ref 11.5–15.5)
WBC: 11.7 10*3/uL — ABNORMAL HIGH (ref 4.0–10.5)
nRBC: 0 % (ref 0.0–0.2)

## 2019-06-01 LAB — BASIC METABOLIC PANEL
Anion gap: 8 (ref 5–15)
BUN: 13 mg/dL (ref 6–20)
CO2: 27 mmol/L (ref 22–32)
Calcium: 9 mg/dL (ref 8.9–10.3)
Chloride: 107 mmol/L (ref 98–111)
Creatinine, Ser: 0.94 mg/dL (ref 0.44–1.00)
GFR calc Af Amer: >60 mL/min (ref >60)
GFR calc non Af Amer: >60 mL/min (ref >60)
Glucose, Bld: 110 mg/dL — ABNORMAL HIGH (ref 70–99)
Potassium: 4.2 mmol/L (ref 3.5–5.1)
Sodium: 142 mmol/L (ref 135–145)

## 2019-06-01 LAB — SURGICAL PATHOLOGY

## 2019-06-01 MED ORDER — LORAZEPAM 2 MG/ML IJ SOLN
1.0000 mg | Freq: Once | INTRAMUSCULAR | Status: AC
  Administered 2019-06-01: 1 mg via INTRAVENOUS

## 2019-06-01 MED ORDER — ASPIRIN EC 81 MG PO TBEC
81.0000 mg | DELAYED_RELEASE_TABLET | Freq: Every day | ORAL | Status: DC
  Administered 2019-06-01: 81 mg via ORAL
  Filled 2019-06-01 (×2): qty 1

## 2019-06-01 MED ORDER — PANTOPRAZOLE SODIUM 40 MG PO TBEC
40.0000 mg | DELAYED_RELEASE_TABLET | Freq: Every day | ORAL | Status: DC
  Administered 2019-06-01: 40 mg via ORAL
  Filled 2019-06-01 (×2): qty 1

## 2019-06-01 MED ORDER — LORAZEPAM 2 MG/ML IJ SOLN
INTRAMUSCULAR | Status: AC
  Administered 2019-06-01: 1 mg via INTRAVENOUS
  Filled 2019-06-01: qty 1

## 2019-06-01 MED ORDER — ROSUVASTATIN CALCIUM 20 MG PO TABS
20.0000 mg | ORAL_TABLET | Freq: Every day | ORAL | Status: DC
  Filled 2019-06-01: qty 1

## 2019-06-01 MED ORDER — PAROXETINE HCL 20 MG PO TABS
40.0000 mg | ORAL_TABLET | Freq: Every day | ORAL | Status: DC
  Administered 2019-06-01: 40 mg via ORAL
  Filled 2019-06-01 (×2): qty 2

## 2019-06-01 MED ORDER — ACETAMINOPHEN 10 MG/ML IV SOLN: 1000.0000 mg | Freq: Once | INTRAVENOUS | Status: AC

## 2019-06-01 MED ORDER — MORPHINE SULFATE (PF) 10 MG/ML IV SOLN
INTRAVENOUS | Status: AC
  Administered 2019-06-01: 4 mg
  Filled 2019-06-01: qty 1

## 2019-06-01 MED ORDER — ICOSAPENT ETHYL 1 G PO CAPS
1.0000 g | ORAL_CAPSULE | Freq: Every day | ORAL | Status: DC
  Filled 2019-06-01: qty 1

## 2019-06-01 MED ORDER — ACETAMINOPHEN 10 MG/ML IV SOLN
INTRAVENOUS | Status: AC
  Administered 2019-06-01: 1000 mg via INTRAVENOUS
  Filled 2019-06-01: qty 100

## 2019-06-01 MED ORDER — KETOROLAC TROMETHAMINE 30 MG/ML IJ SOLN
INTRAMUSCULAR | Status: AC
  Administered 2019-06-01: 30 mg via INTRAVENOUS
  Filled 2019-06-01: qty 1

## 2019-06-01 MED ORDER — CARVEDILOL 3.125 MG PO TABS
3.1250 mg | ORAL_TABLET | Freq: Two times a day (BID) | ORAL | Status: DC
  Administered 2019-06-01: 3.125 mg via ORAL
  Filled 2019-06-01 (×2): qty 1

## 2019-06-01 MED ORDER — HYDROCODONE-ACETAMINOPHEN 5-325 MG PO TABS: 1.0000 | ORAL_TABLET | ORAL | Status: DC | PRN

## 2019-06-01 MED ORDER — KETOROLAC TROMETHAMINE 30 MG/ML IJ SOLN: 30.0000 mg | Freq: Once | INTRAMUSCULAR | Status: AC

## 2019-06-01 MED ORDER — UBROGEPANT 100 MG PO TABS: 100.0000 mg | ORAL_TABLET | Freq: Every day | ORAL | Status: DC | PRN

## 2019-06-01 MED ORDER — ALPRAZOLAM ER 1 MG PO TB24: 1.0000 mg | ORAL_TABLET | ORAL | Status: DC

## 2019-06-01 NOTE — Procedures (Signed)
ELECTROENCEPHALOGRAM REPORT   Patient: Alicia [name] Note [name]       Room #: PO23A-AA EEG No. ID: [id number] Age: 49 y.o.        Sex: female Requesting Physician: Denton Lank Report Date:  06/01/2019        Interpreting Physician: Thana Farr  History: Alicia [name] is an 49 y.o. female with seizure like activity post op  Medications:  Xanax, ASA, Coreg, Vascepa, Paxil, Crestor  Conditions of Recording:  This is a 21 channel routine scalp EEG performed with bipolar and monopolar montages arranged in accordance to the international 10/20 system of electrode placement. One channel was dedicated to EKG recording.  The patient is in the awake and drowsy states.  Description:  The waking background activity consists of a low voltage, symmetrical, fairly well organized, 8-9 Hz alpha activity, seen from the parieto-occipital and posterior temporal regions.  Low voltage fast activity, poorly organized, is seen anteriorly and is at times superimposed on more posterior regions.  A mixture of theta and alpha rhythms are seen from the central and temporal regions. The patient drowses with slowing to irregular, low voltage theta and beta activity.   Stage II sleep is not obtained. No epileptiform activity is noted.   Hyperventilation and intermittent photic stimulation were not performed.   IMPRESSION: Normal electroencephalogram, awake and drowsy. There are no focal lateralizing or epileptiform features.   Thana Farr, MD Neurology [phone] 06/01/2019, 10:31 AM

## 2019-06-01 NOTE — Progress Notes (Signed)
Dr. Peggye Form at bedside. MD has ordered IV tylenol and IV toradol once for headache. Pt and spouse agreeable. VSS. Diet advanced. Pt tolerating sips of soda. Full liquid tray ordered from dining

## 2019-06-01 NOTE — Progress Notes (Signed)
Walked into room to find that patient had climbed out of bed over the side rails to use commode chair.  When putting down the side rail to assist her back to bed patient started mumbling, crying c/o abdominal pain, unable to rate on a scale of 0-10, c/o chest pressure when she takes a deep breath.  Medicated with MS04 4mg  IV for pain, educated patient regarding "gas" and pressure sensations that can follow laparoscopic gallbladder removal.  BP 119/87, HR 117, Sinus tach, O2 sat 94% on Room air.  Called patient's husband for her, he is on his way in.

## 2019-06-01 NOTE — Progress Notes (Addendum)
Pt. Suddenly woke up crying and screaming. She stated she "hurt everywhere". Pt. Was unable to be calmed by nursing staff. Pt. Demands that that " a doctor come to me right now". Pt. Advised that we were contacting for help and that there was no Dr. Present. She then began chanting" I want a doctor here right now" Called NP Jon Billings and received order for ativan. Pt.  continues to cry and be anxious. She advised if pt. continues to be hysterical the medication could be repeated in 30 minutes. Pt. did not improve and continues to scream and cry. A second dose of medication will be given at time.

## 2019-06-01 NOTE — Progress Notes (Signed)
Events of post op reviewed. Patient responsive to husband, follow commands. He reports this is her normal response to stressors in life.  Lungs: Clear. Cardio: RR ABD: Non-distended, soft. Normal BS.  Slowly responds to questions.  EGD: Normal except for a few gastric fundic gland polyps. No ulcers. No evidence of a hiatal hernia.  Cholecystectomy for chronic cholecystitis/ cholelithiasis.  With primary complaint this AM severe headache, will RX w/ IV Tylenol x 1 and IV Toradol x 1.  Diet as tolerated.  Husband very much wants her home, hopefully, she will be able to go later today.

## 2019-06-01 NOTE — Progress Notes (Signed)
Pt alert and oriented, VSS. States headache has resolved with no other pain or discomfort. Pt is able to tolerate full liquid diet and PO medications. Up to [facility] with standby assist. Able to void. Spouse at bedside. Surgeon, hospitalist, and PT at bedside today to evaluate. Dr. Denton Lank has put in discharge orders. Discharge paperwork reviewed with patient and spouse, medications taken today are identified on AVS. IV removed. Pt discharged with spouse.

## 2019-06-01 NOTE — Progress Notes (Signed)
Subjective: Patient and husband feel that at this point she needs rest more than anything and has been unable to get much while here.  No new complaints.    Objective: Current vital signs: BP (!) 110/58 (BP Location: Right Arm)   Pulse 97   Temp 98.8 F (37.1 C)   Resp 20   Ht 5' 7.99" (1.727 m)   Wt 90.7 kg   LMP 01/15/2015 (Exact Date)   SpO2 95%   BMI 30.41 kg/m  Vital signs in last 24 hours: Temp:  [97.2 F (36.2 C)-99.2 F (37.3 C)] 98.8 F (37.1 C) (04/13 0830) Pulse Rate:  [97-122] 97 (04/13 0915) Resp:  [13-28] 20 (04/13 0830) BP: (85-141)/(53-80) 110/58 (04/13 1043) SpO2:  [91 %-99 %] 95 % (04/13 1043) Weight:  [90.7 kg] 90.7 kg (04/12 2032)  Intake/Output from previous day: 04/12 0701 - 04/13 0700 In: 2000 [I.V.:2000] Out: 1100 [Urine:1100] Intake/Output this shift: Total I/O In: 25 [P.O.:25] Out: 300 [Urine:300] Nutritional status:  Diet Order            Diet full liquid Room service appropriate? Yes; Fluid consistency: Thin  Diet effective now        Diet - low sodium heart healthy              Neurologic Exam: Mental Status: Lethargic but arousable.  Follows commands.  Speech minimal.   Cranial Nerves: II: Visual fields grossly normal III,IV, VI: Extra-ocular motions intact bilaterally V,VII: smile symmetric VIII: hearing normal bilaterally IX,X: gag reflex present XI: bilateral shoulder shrug XII: midline tongue extension Motor: Moves all extremities weakly.  Generalized tremor noted.   Sensory: Pinprick and light touch intact throughout, bilaterally   Lab Results: Basic Metabolic Panel: Recent Labs  Lab 05/31/19 1657 06/01/19 0635  NA 138 142  K 4.3 4.2  CL 106 107  CO2 23 27  GLUCOSE 156* 110*  BUN 12 13  CREATININE 0.94 0.94  CALCIUM 8.9 9.0  MG 2.2  --     Liver Function Tests: Recent Labs  Lab 05/31/19 1657  AST 28  ALT 23  ALKPHOS 66  BILITOT 0.8  PROT 6.8  ALBUMIN 3.9   No results for input(s): LIPASE,  AMYLASE in the last 168 hours. No results for input(s): AMMONIA in the last 168 hours.  CBC: Recent Labs  Lab 05/31/19 1657 06/01/19 0635  WBC 9.4 11.7*  NEUTROABS 8.7*  --   HGB 13.1 13.0  HCT 38.3 37.5  MCV 89.3 89.3  PLT 235 248    Cardiac Enzymes: No results for input(s): CKTOTAL, CKMB, CKMBINDEX, TROPONINI in the last 168 hours.  Lipid Panel: No results for input(s): CHOL, TRIG, HDL, CHOLHDL, VLDL, LDLCALC in the last 168 hours.  CBG: Recent Labs  Lab 05/31/19 1149  GLUCAP 149*    Microbiology: Results for orders placed or performed during the hospital encounter of 05/27/19  SARS CORONAVIRUS 2 (TAT 6-24 HRS) Nasopharyngeal Nasopharyngeal Swab     Status: None   Collection Time: 05/27/19  1:21 PM   Specimen: Nasopharyngeal Swab  Result Value Ref Range Status   SARS Coronavirus 2 NEGATIVE NEGATIVE Final    Comment: (Alicia) SARS-CoV-2 target nucleic acids are NOT DETECTED. The SARS-CoV-2 RNA is generally detectable in upper and lower respiratory specimens during the acute phase of infection. Negative results do not preclude SARS-CoV-2 infection, do not rule out co-infections with other pathogens, and should not be used as the sole basis for treatment or other patient management decisions.  Negative results must be combined with clinical observations, patient history, and epidemiological information. The expected result is Negative. Fact Sheet for Patients: SugarRoll.be Fact Sheet for Healthcare Providers: https://www.woods-mathews.com/ This test is not yet approved or cleared by the [place] FDA and  has been authorized for detection and/or diagnosis of SARS-CoV-2 by FDA under an Emergency Use Authorization (EUA). This EUA will remain  in effect (meaning this test can be used) for the duration of the COVID-19 declaration under Section 56 4(b)(1) of the Act, 21 U.S.C. section 360bbb-3(b)(1), unless the authorization is  terminated or revoked sooner. Performed at [facility], [place] [address], [place], [place] [address]     Coagulation Studies: No results for input(s): LABPROT, INR in the last 72 hours.  Imaging: EEG  Result Date: 06/01/2019 Alicia Goodell, MD     06/01/2019 10:35 AM ELECTROENCEPHALOGRAM REPORT Patient: Alicia [name] Alicia Alicia [name]       Room #: KD32I-ZT EEG No. ID: [id number] Age: 49 y.o.        Sex: female Requesting Physician: Arbutus Ped Report Date:  06/01/2019       Interpreting Physician: Alicia [name] History: Alicia [name] Alicia [name] is an 49 y.o. female with seizure like activity post op Medications: Xanax, ASA, Coreg, Vascepa, Paxil, Crestor Conditions of Recording:  This is a 21 channel routine scalp EEG performed with bipolar and monopolar montages arranged in accordance to the international 10/20 system of electrode placement. One channel was dedicated to EKG recording. The patient is in the awake and drowsy states. Description:  The waking background activity consists of a low voltage, symmetrical, fairly well organized, 8-9 Hz alpha activity, seen from the parieto-occipital and posterior temporal regions.  Low voltage fast activity, poorly organized, is seen anteriorly and is at times superimposed on more posterior regions.  A mixture of theta and alpha rhythms are seen from the central and temporal regions. The patient drowses with slowing to irregular, low voltage theta and beta activity.  Stage II sleep is not obtained. No epileptiform activity is noted.  Hyperventilation and intermittent photic stimulation were not performed. IMPRESSION: Normal electroencephalogram, awake and drowsy. There are no focal lateralizing or epileptiform features. Alicia Goodell, MD Neurology [phone] 06/01/2019, 10:31 AM   DG Cholangiogram Operative  Result Date: 05/31/2019 CLINICAL DATA:  49 year old female with a history of cholelithiasis EXAM: INTRAOPERATIVE CHOLANGIOGRAM TECHNIQUE: Cholangiographic  images from the C-arm fluoroscopic device were submitted for interpretation post-operatively. Please see the procedural report for the amount of contrast and the fluoroscopy time utilized. COMPARISON:  05/05/2019, 05/08/2019 FINDINGS: Surgical instruments project over the upper abdomen. There is cannulation of the cystic duct/gallbladder neck, with antegrade infusion of contrast. Caliber of the extrahepatic ductal system within normal limits. No definite filling defect within the extrahepatic ducts identified. Free flow of contrast across the ampulla. IMPRESSION: Intraoperative cholangiogram demonstrates extrahepatic biliary ducts of unremarkable caliber, with no definite filling defects identified. Free flow of contrast across the ampulla. Please refer to the dictated operative report for full details of intraoperative findings and procedure Electronically Signed   By: Corrie Mckusick D.O.   On: 05/31/2019 11:21    Medications:  I have reviewed the patient's current medications. Scheduled: . ALPRAZolam  1 mg Oral [id number]  . aspirin EC  81 mg Oral Daily  . carvedilol  3.125 mg Oral BID WC  . enoxaparin (LOVENOX) injection  40 mg Subcutaneous Q24H  . icosapent Ethyl  1 g Oral Daily  . pantoprazole  40 mg Oral  Daily  . PARoxetine  40 mg Oral Daily  . rosuvastatin  20 mg Oral QHS    Assessment/Plan: 49 y.o. female with medical history significant ofstroke with residual left-sided weakness, migraine, sleep apnea, iron deficiency anemia, lumbar degenerative disc disease, kidney stones, insomnia, depression with anxiety and history of nonepileptic seizure-like activity. Patient underwent laparoscopic cholecystectomy today which was uncomplicated. However, in PACU patient was witnessed to have seizure-like activity with shaking of the bilateral upper extremities and loss of consciousness. Multiple episodes noted.  Felt secondary to the stress of surgery.  Patient currently on no anticonvulsant therapy.   EEG unremarkable.    Recommendations: 1. Agree with discharge.  No anticonvulsant therapy indicated at this time. 2. Patient to continue follow up on an outpatient basis.     LOS: 0 days   Thana Farr, MD Neurology [phone] 06/01/2019  11:11 AM

## 2019-06-01 NOTE — Progress Notes (Signed)
Pt. Now calm and sleeping. VSS and in NAD.

## 2019-06-01 NOTE — Evaluation (Signed)
Physical Therapy Evaluation Patient Details Name: Alicia [name] Note Rotolo MRN: [id number] DOB: 01-18-1971 Today's Date: 06/01/2019   History of Present Illness  Pt is a 49 y.o. female with medical history significant of stroke with residual left-sided weakness, migraine, sleep apnea, iron deficiency anemia, lumbar degenerative disc disease, kidney stones, insomnia, depression with anxiety and history of nonepileptic seizure-like activity.  Patient underwent laparoscopic cholecystectomy today which was uncomplicated.  However, in PACU patient was witnessed to have seizure-like activity with shaking of the bilateral upper extremities and loss of consciousness. Patient had multiple brief generalized seizure activity which would be brief and short post ictal state.    Clinical Impression  Patient lethargic, family at bedside. Oriented to self, place,situation, spoke 1-2 words at time, family provided PLOF information. Independent at baseline, works full time as an Airline pilot.   The patient demonstrated bed mobility with min-modA (log rolling technique to minimize abdominal pain/strain.). sit <> stand with minA + handheld assist. Pt was able to stand pivot to [facility] 2 person handheld assist. Of note, pt with tremors, HR in 130s upon exertion, and continued to complain of pain, difficulty keeping eyes open. Further mobility deferred due to this issues. Pt and family spent extensive time reviewing stair navigation, transfer safety, and equipment needs, family verbalized understanding and confidence in ability to assist wife (has been through this before with the patient, will be available 24/7 upon returning home).  Overall the patient demonstrated deficits (see "PT Problem List") that impede the patient's functional abilities, safety, and mobility and would benefit from skilled PT intervention. Recommendation is HHPT with supervision/assistance 24/7.     Follow Up Recommendations Home health  PT;Supervision/Assistance - 24 hour    Equipment Recommendations  3in1 (PT)    Recommendations for Other Services       Precautions / Restrictions Precautions Precautions: Fall Precaution Comments: seizure Restrictions Weight Bearing Restrictions: No      Mobility  Bed Mobility Overal bed mobility: Needs Assistance Bed Mobility: Rolling;Sidelying to Sit;Sit to Sidelying Rolling: Min assist Sidelying to sit: Min assist;HOB elevated     Sit to sidelying: HOB elevated;Mod assist General bed mobility comments: modA for trunk control and LE assist when returning to supine  Transfers Overall transfer level: Needs assistance Equipment used: 1 person hand held assist;2 person hand held assist Transfers: Sit to/from [employer] Sit to Stand: Min assist;From elevated surface Stand pivot transfers: Min assist       General transfer comment: from [facility] minA (handheld assist x2)  Ambulation/Gait             General Gait Details: deferred due to pt lethargy, elevated HR, tremors  Stairs            Wheelchair Mobility    Modified Rankin (Stroke Patients Only)       Balance Overall balance assessment: Needs assistance Sitting-balance support: Feet supported Sitting balance-Leahy Scale: Fair       Standing balance-Leahy Scale: Poor Standing balance comment: at least unilateral UE support                             Pertinent Vitals/Pain Pain Assessment: 0-10 Pain Score: 3  Pain Location: abdomen Pain Descriptors / Indicators: Grimacing;Guarding Pain Intervention(s): Limited activity within patient's tolerance;Monitored during session;Repositioned    Home Living Family/patient expects to be discharged to:: Private residence Living Arrangements: Spouse/significant other Available Help at Discharge: Family;Available 24 hours/day;Neighbor Type of Home: House Home  Access: Stairs to enter Entrance Stairs-Rails: Right Entrance  Stairs-Number of Steps: 3 Home Layout: One level Home Equipment: Wheelchair - manual;Cane - single point      Prior Function Level of Independence: Independent         Comments: works full time as an Sports administrator        Extremity/Trunk Assessment   Upper Extremity Assessment Upper Extremity Assessment: Generalized weakness    Lower Extremity Assessment Lower Extremity Assessment: Generalized weakness       Communication   Communication: No difficulties  Cognition Arousal/Alertness: Lethargic Behavior During Therapy: Anxious;Flat affect Overall Cognitive Status: Impaired/Different from baseline                                 General Comments: Pt husband stated she has had this change in mentation previously with stress, especially after her stroke. spoke with 1-2 words, oriented to self, place, situation.      General Comments      Exercises Other Exercises Other Exercises: maxA for perihygiene after urinating in bedside commode   Assessment/Plan    PT Assessment Patient needs continued PT services  PT Problem List Decreased strength;Decreased mobility;Decreased activity tolerance;Decreased balance;Pain       PT Treatment Interventions DME instruction;Therapeutic exercise;Gait training;Stair training;Balance training;Therapeutic activities;Patient/family education;Neuromuscular re-education;Functional mobility training    PT Goals (Current goals can be found in the Care Plan section)  Acute Rehab PT Goals Patient Stated Goal: to go home PT Goal Formulation: With patient/family Time For Goal Achievement: 06/15/19 Potential to Achieve Goals: Good    Frequency Min 2X/week   Barriers to discharge        Co-evaluation               AM-PAC PT "6 Clicks" Mobility  Outcome Measure Help needed turning from your back to your side while in a flat bed without using bedrails?: A Lot Help needed moving from lying on your  back to sitting on the side of a flat bed without using bedrails?: A Lot Help needed moving to and from a bed to a chair (including a wheelchair)?: A Lot Help needed standing up from a chair using your arms (e.g., wheelchair or bedside chair)?: A Lot Help needed to walk in hospital room?: A Lot Help needed climbing 3-5 steps with a railing? : Total 6 Click Score: 11    End of Session Equipment Utilized During Treatment: Gait belt Activity Tolerance: Patient limited by lethargy Patient left: in bed;with family/visitor present;with call bell/phone within reach Nurse Communication: Mobility status PT Visit Diagnosis: Other abnormalities of gait and mobility (R26.89);Muscle weakness (generalized) (M62.81);Difficulty in walking, not elsewhere classified (R26.2);Pain Pain - Right/Left: (midline) Pain - part of body: (abdomen)    Time: 0935-1000 PT Time Calculation (min) (ACUTE ONLY): 25 min   Charges:   PT Evaluation $PT Eval Low Complexity: 1 Low $PT Eval Moderate Complexity: 1 Mod PT Treatments $Therapeutic Activity: 8-22 mins        Lieutenant Diego PT, DPT 10:30 AM,06/01/19

## 2019-06-01 NOTE — Discharge Summary (Signed)
Physician Discharge Summary  [name] Note Blumberg GBT:[id number] DOB: [birth year] DOA: 05/31/2019  PCP: Alba Cory, MD  Admit date: 05/31/2019 Discharge date: 06/07/2019  Admitted From: home Disposition:  home  Recommendations for Outpatient Follow-up:  1. Follow up with PCP in 1-2 weeks 2. Please obtain BMP/CBC in one week 3. Please follow up with surgeon for post-op visit as scheduled 4. Please follow up with your neurologist  Home Health: No  Equipment/Devices: Already have all needs   Discharge Condition: Stable  CODE STATUS: Full  Diet recommendation: Regular   Brief/Interim Summary:  Alicia [name] Note Avino is a 49 y.o. female with medical history significant of stroke with residual left-sided weakness, migraine, sleep apnea, iron deficiency anemia, lumbar degenerative disc disease, kidney stones, insomnia, depression with anxiety and history of nonepileptic seizure-like activity.  Patient underwent laparoscopic cholecystectomy today which was uncomplicated.  However, in PACU patient was witnessed to have seizure-like activity with shaking of the bilateral upper extremities and loss of consciousness.  Once this resolved patient appeared to be postictal as well.  Neurology was contacted and recommended admission for EEG and further work-up given patient's history of seizures and also prior stroke.  Spoke with patient's husband by phone.  He was able to see the patient in PACU and reports that her current symptoms are consistent with her prior reactions to stress.  He states that any emotional or physical stressors caused these episodes with seizure-like activity, speech abnormalities and increased weakness from her prior strokes.  He states that it usually takes her several days to recover from these episodes.  Vitals in PACU: Temp 97.2, HR 10 4-1 14, blood pressure 118/77, O2 sat 95% on 2 L/min nasal cannula oxygen.  Most recent labs are from 05/04/2019 which showed a unremarkable  CMP other than glucose 115.  Lipid panel showed triglycerides 305, LDL 118, HDL 34.  CBC was normal at that time.  A1c 5.7%.  Patient admitted for observation to hospitalist service with neurology consulted.  Seizure-like activity -patient had episode in PACU of bilateral upper extremity shaking and apparent loss of consciousness and subsequently postictal.  Husband reports this is consistent with her prior episodes.  She has been evaluated for epileptic seizures with no definitive diagnosis.  Episodes are consistent with psychogenic nonepileptic seizure-like activity.  Due to patient's history of stroke however neurology recommended admission for observation and EEG.  Neurology consulted. --EEG findings: Normal electroencephalogram, awake and drowsy. There are no focal lateralizing or epileptiform features. --Seizure precautions & neurochecks during admission  Patient and husband both state this is patient's usual stress reaction to physical or emotional stress, that they have experienced this several times and report patient will return to baseline once she gets solid uninterrupted sleep.  Husband reports being fully capable of assisting her until she recovers.  He agrees to be with her 24/7 for supervision and assistance with all mobility until patient back to baseline.   History of psychogenic nonepileptic seizures - mgmt as above  History of strokes - with residual mild left-sided weakness.  Patient suffered strokes in 2017 and again in 2018.  She reports intermittent spells of delayed speech or expressive aphasia, occasional worsening left-sided weakness.  These usually occur after her seizure-like episodes.  Patient does not appear to have any new onset focal neurologic deficits --mgmt as above --PT eval tomorrow  Abdominal pain - s/p lap chole today.   --pain control and antiemetics as needed  Degenerative disc disease, lumbar Chronic pain syndrome --  continue home med regimen with  PRN pain meds for post-op pain  Sleep apnea - continue CPAP use nightly Major depression  Gastric reflux  Vitamin D deficiency  GAD (generalized anxiety disorder)  Migraine without aura and without status migrainosus, not intractable  Hyperlipidemia  Overweight  --continue current home medications for above chronic conditions     Discharge Diagnoses: Principal Problem:   Seizure-like activity (HCC) Active Problems:   Psychogenic nonepileptic seizure   History of stroke   Abdominal pain   Degenerative disc disease, lumbar   Chronic pain syndrome   Sleep apnea   Major depression (HCC)   Gastric reflux   Overweight   Vitamin D deficiency   GAD (generalized anxiety disorder)   Migraine without aura and without status migrainosus, not intractable   Hyperlipidemia    Discharge Instructions   Discharge Instructions    Call MD for:  redness, tenderness, or signs of infection (pain, swelling, redness, odor or green/yellow discharge around incision site)   Complete by: As directed    Call MD for:  severe uncontrolled pain   Complete by: As directed    Call MD for:  temperature >100.4   Complete by: As directed    Diet - low sodium heart healthy   Complete by: As directed    Discharge instructions   Complete by: As directed    Heating pad to abdomen for comfort.  OK to shower at any time.  Outer plastic dressing can be removed in three days if desired.  Eat modestly for the first meal.  No driving until pain free.  Avoid lifting over 10 pounds for the next week.   Tylenol/ Advil / Aleve for soreness.  Norco (hydrocodone): If needed for pain. This medication may constipate.  Laxative of choice if needed.   Discharge instructions   Complete by: As directed    Patient must have 24/7 supervision and assistance with mobility until back to baseline.   Increase activity slowly   Complete by: As directed    Increase activity slowly   Complete by: As directed       Allergies as of 06/01/2019      Reactions   Azithromycin Diarrhea   Penicillins Hives, Swelling   Has patient had a PCN reaction causing immediate rash, facial/tongue/throat swelling, SOB or lightheadedness with hypotension: Yes Has patient had a PCN reaction causing severe rash involving mucus membranes or skin necrosis: No Has patient had a PCN reaction that required hospitalization No Has patient had a PCN reaction occurring within the last 10 years: No If all of the above answers are "NO", then may proceed with Cephalosporin use.      Medication List    STOP taking these medications   albuterol 108 (90 Base) MCG/ACT inhaler Commonly known as: VENTOLIN HFA     TAKE these medications   ALPRAZolam 1 MG 24 hr tablet Commonly known as: XANAX XR Take 1 tablet (1 mg total) by mouth daily. What changed: when to take this   aspirin EC 81 MG tablet Take 81 mg by mouth daily. What changed: Another medication with the same name was removed. Continue taking this medication, and follow the directions you see here.   carvedilol 3.125 MG tablet Commonly known as: COREG TAKE 1 TABLET(3.125 MG) BY MOUTH TWICE DAILY WITH A MEAL What changed:   how much to take  how to take this  when to take this  additional instructions   cyclobenzaprine 10 MG tablet Commonly  known as: FLEXERIL Take 1 tablet (10 mg total) by mouth at bedtime. What changed: when to take this   HYDROcodone-acetaminophen 5-325 MG tablet Commonly known as: NORCO/VICODIN Take 1 tablet by mouth every 4 (four) hours as needed for moderate pain.   OVER THE COUNTER MEDICATION Take 1 tablet by mouth daily. Previtalize   pantoprazole 40 MG tablet Commonly known as: PROTONIX Take 1 tablet (40 mg total) by mouth daily. What changed: when to take this   PARoxetine 20 MG tablet Commonly known as: PAXIL Take 3 tablets (60 mg total) by mouth every morning. What changed:   how much to take  when to take this    rosuvastatin 20 MG tablet Commonly known as: CRESTOR Take 1 tablet (20 mg total) by mouth daily. What changed: when to take this   Ubrelvy 100 MG Tabs Generic drug: Ubrogepant Take 100 mg by mouth daily as needed (migraine).   Vascepa 1 g capsule Generic drug: icosapent Ethyl Take 1 capsule (1 g total) by mouth 2 (two) times daily. What changed: when to take this   Vitamin D (Ergocalciferol) 1.25 MG (50000 UNIT) Caps capsule Commonly known as: DRISDOL TAKE 1 CAPSULE BY MOUTH EVERY 7 DAYS What changed: See the new instructions.   zolpidem 10 MG tablet Commonly known as: Ambien Take 1 tablet (10 mg total) by mouth at bedtime as needed for sleep.      Follow-up Information    Byrnett, Merrily Pew, MD Follow up in 2 week(s).   Specialties: General Surgery, Radiology Why: June 17, 2019 @ 10:30 am  Contact information: [address] [place] [address] [phone]          Allergies  Allergen Reactions  . Azithromycin Diarrhea  . Penicillins Hives and Swelling    Has patient had a PCN reaction causing immediate rash, facial/tongue/throat swelling, SOB or lightheadedness with hypotension: Yes Has patient had a PCN reaction causing severe rash involving mucus membranes or skin necrosis: No Has patient had a PCN reaction that required hospitalization No Has patient had a PCN reaction occurring within the last 10 years: No If all of the above answers are "NO", then may proceed with Cephalosporin use.    Consultations:  Neurology  General Surgery   Procedures/Studies: EEG  Result Date: 06/01/2019 Thana Farr, MD     06/01/2019 10:35 AM ELECTROENCEPHALOGRAM REPORT Patient: Alicia [name] Note [name]       Room #: PO23A-AA EEG No. ID: [id number] Age: 49 y.o.        Sex: female Requesting Physician: Denton Lank Report Date:  06/01/2019       Interpreting Physician: Thana Farr History: Nashaly Dorantes Note Reasner is an 49 y.o. female with seizure like activity  post op Medications: Xanax, ASA, Coreg, Vascepa, Paxil, Crestor Conditions of Recording:  This is a 21 channel routine scalp EEG performed with bipolar and monopolar montages arranged in accordance to the international 10/20 system of electrode placement. One channel was dedicated to EKG recording. The patient is in the awake and drowsy states. Description:  The waking background activity consists of a low voltage, symmetrical, fairly well organized, 8-9 Hz alpha activity, seen from the parieto-occipital and posterior temporal regions.  Low voltage fast activity, poorly organized, is seen anteriorly and is at times superimposed on more posterior regions.  A mixture of theta and alpha rhythms are seen from the central and temporal regions. The patient drowses with slowing to irregular, low voltage theta and beta activity.  Stage II sleep is not obtained. No epileptiform activity is noted.  Hyperventilation and intermittent photic stimulation were not performed. IMPRESSION: Normal electroencephalogram, awake and drowsy. There are no focal lateralizing or epileptiform features. Thana Farr, MD Neurology [phone] 06/01/2019, 10:31 AM   DG Cholangiogram Operative  Result Date: 05/31/2019 CLINICAL DATA:  49 year old female with a history of cholelithiasis EXAM: INTRAOPERATIVE CHOLANGIOGRAM TECHNIQUE: Cholangiographic images from the C-arm fluoroscopic device were submitted for interpretation post-operatively. Please see the procedural report for the amount of contrast and the fluoroscopy time utilized. COMPARISON:  05/05/2019, 05/08/2019 FINDINGS: Surgical instruments project over the upper abdomen. There is cannulation of the cystic duct/gallbladder neck, with antegrade infusion of contrast. Caliber of the extrahepatic ductal system within normal limits. No definite filling defect within the extrahepatic ducts identified. Free flow of contrast across the ampulla. IMPRESSION: Intraoperative cholangiogram  demonstrates extrahepatic biliary ducts of unremarkable caliber, with no definite filling defects identified. Free flow of contrast across the ampulla. Please refer to the dictated operative report for full details of intraoperative findings and procedure Electronically Signed   By: Gilmer Mor D.O.   On: 05/31/2019 11:21   MR ABDOMEN W WO CONTRAST  Result Date: 05/19/2019 CLINICAL DATA:  49 year old female with history of low-attenuation lesion in the uncinate process of the pancreas on recent noncontrast CT the abdomen and pelvis. Follow-up study for definitive characterization. EXAM: MRI ABDOMEN WITHOUT AND WITH CONTRAST TECHNIQUE: Multiplanar multisequence MR imaging of the abdomen was performed both before and after the administration of intravenous contrast. CONTRAST:  21mL GADAVIST GADOBUTROL 1 MMOL/ML IV SOLN COMPARISON:  No prior abdominal MRI. CT the abdomen and pelvis 05/05/2019. FINDINGS: Lower chest: Visualized portions are unremarkable. Hepatobiliary: Mild diffuse loss of signal intensity throughout the hepatic parenchyma on out of phase dual echo images, indicative of hepatic steatosis. No suspicious cystic or solid hepatic lesions. No intra or extrahepatic biliary ductal dilatation. There is some amorphous T1 hyperintense mildly T2 hypointense material lying dependently in the gallbladder, compatible with biliary sludge. Gallbladder is not distended. Gallbladder wall thickness is normal. No pericholecystic fluid or surrounding inflammatory changes. Pancreas: In the posterior aspect of the pancreatic head there is a well-defined 1.7 x 1.1 x 1.9 cm T1 hypointense, T2 hyperintense multilocular lesion (axial image 26 of series 4 and coronal image 16 of series 3) with multiple thin internal septations, which demonstrates minimal enhancement of the septations on post gadolinium imaging. This comes in close proximity to the distal common bile duct, but appears well separated from the pancreatic duct.  In the anterior aspect of the pancreatic head there is also a 6 mm T1 hypointense, T2 hyperintense, nonenhancing lesion (axial image 27 of series 4). Body and tail of the pancreas are normal in appearance. No pancreatic ductal dilatation. No peripancreatic fluid collections or inflammatory changes. Spleen:  Unremarkable. Adrenals/Urinary Tract: Subcentimeter T1 hypointense, T2 hyperintense, nonenhancing lesion in the upper pole the left kidney, compatible with a tiny cyst. Right kidney and bilateral adrenal glands are normal in appearance. No hydroureteronephrosis in the visualized portions of the abdomen. Stomach/Bowel: Visualized portions are unremarkable. Vascular/Lymphatic: No aneurysm identified in the visualized abdominal vasculature. No lymphadenopathy noted in the abdomen. Other: No significant volume of ascites noted in the visualized portions of the peritoneal cavity. Musculoskeletal: No aggressive appearing osseous lesions are noted in the visualized portions of the skeleton. IMPRESSION: 1. The lesion of concern in the posterior aspect of the pancreatic head has imaging characteristics most suggestive of a small serous cystadenoma. Further  evaluation with endoscopic ultrasound is suggested. 2. 6 mm cystic lesion in the anterior aspect of the pancreatic head is nonspecific, favored to represent a tiny pancreatic pseudocyst or other benign lesion. 3. Mild hepatic steatosis. Electronically Signed   By: Vinnie Langton M.D.   On: 05/19/2019 08:49   CUP PACEART REMOTE DEVICE CHECK  Result Date: 05/16/2019 [facility] summary report received. Battery status OK. Normal device function. No new symptom episodes, tachy episodes, brady, or pause episodes. No new AF episodes. Monthly summary reports and ROV/PRN. Felisa Bonier, RN, MSN  DG ESOPHAGUS W DOUBLE CM (HD)  Result Date: 05/19/2019 CLINICAL DATA:  Worsening gastroesophageal reflux EXAM: ESOPHOGRAM / BARIUM SWALLOW / BARIUM TABLET STUDY TECHNIQUE: Combined  double contrast and single contrast examination performed using effervescent crystals, thick barium liquid, and thin barium liquid. The patient was observed with fluoroscopy swallowing a 13 mm barium sulphate tablet. FLUOROSCOPY TIME:  Fluoroscopy Time:  0.5 minute Radiation Exposure Index (if provided by the fluoroscopic device): 3 mGy Number of Acquired Spot Images: 0 COMPARISON:  None. FINDINGS: Normal pharyngeal anatomy and motility. Contrast flowed freely through the esophagus without evidence of a stricture or mass. Normal esophageal mucosa without evidence of irregularity or ulceration. Esophageal motility was normal. Mild gastroesophageal reflux. No definite hiatal hernia was demonstrated. At the end of the examination a 13 mm barium tablet was administered which transited through the esophagus and esophagogastric junction without delay. IMPRESSION: Mild gastroesophageal reflux. Electronically Signed   By: Kathreen Devoid   On: 05/19/2019 10:21       Subjective: Patient had severe headaches and pain overnight and earlier this AM, overall had a miserable night.  Now sleeping with husband at bedside.  Patient wakes and responds to questions, agrees rest at home is what she needs to recover.  Husband assures he has cared for her through similar many times and comfortable with going home.   Discharge Exam: Vitals:   06/01/19 0915 06/01/19 1043  BP:  (!) 110/58  Pulse: 97   Resp:    Temp:    SpO2:  95%   Vitals:   06/01/19 0500 06/01/19 0830 06/01/19 0915 06/01/19 1043  BP: 123/76 138/75  (!) 110/58  Pulse: 99 (!) 108 97   Resp: 15 20    Temp: 98.5 F (36.9 C) 98.8 F (37.1 C)    TempSrc:      SpO2:  94%  95%  Weight:      Height:        General: Pt is sleeping but responds to voice, not in acute distress Cardiovascular: RRR, S1/S2 +, no rubs, no gallops Respiratory: CTA bilaterally, no wheezing, no rhonchi Neurology: mild speech delay but stable, orientedx4 Extremities: no edema,  no cyanosis    The results of significant diagnostics from this hospitalization (including imaging, microbiology, ancillary and laboratory) are listed below for reference.     Microbiology: No results found for this or any previous visit (from the past 240 hour(s)).   Labs: BNP (last 3 results) Recent Labs    09/30/18 0953  BNP 4   Basic Metabolic Panel: Recent Labs  Lab 05/31/19 1657 06/01/19 0635  NA 138 142  K 4.3 4.2  CL 106 107  CO2 23 27  GLUCOSE 156* 110*  BUN 12 13  CREATININE 0.94 0.94  CALCIUM 8.9 9.0  MG 2.2  --    Liver Function Tests: Recent Labs  Lab 05/31/19 1657  AST 28  ALT 23  ALKPHOS 66  BILITOT 0.8  PROT 6.8  ALBUMIN 3.9   No results for input(s): LIPASE, AMYLASE in the last 168 hours. No results for input(s): AMMONIA in the last 168 hours. CBC: Recent Labs  Lab 05/31/19 1657 06/01/19 0635  WBC 9.4 11.7*  NEUTROABS 8.7*  --   HGB 13.1 13.0  HCT 38.3 37.5  MCV 89.3 89.3  PLT 235 248   Cardiac Enzymes: No results for input(s): CKTOTAL, CKMB, CKMBINDEX, TROPONINI in the last 168 hours. BNP: Invalid input(s): POCBNP CBG: Recent Labs  Lab 05/31/19 1149  GLUCAP 149*   D-Dimer No results for input(s): DDIMER in the last 72 hours. Hgb A1c No results for input(s): HGBA1C in the last 72 hours. Lipid Profile No results for input(s): CHOL, HDL, LDLCALC, TRIG, CHOLHDL, LDLDIRECT in the last 72 hours. Thyroid function studies No results for input(s): TSH, T4TOTAL, T3FREE, THYROIDAB in the last 72 hours.  Invalid input(s): FREET3 Anemia work up No results for input(s): VITAMINB12, FOLATE, FERRITIN, TIBC, IRON, RETICCTPCT in the last 72 hours. Urinalysis    Component Value Date/Time   COLORURINE YELLOW (A) 09/27/2017 2327   APPEARANCEUR CLEAR (A) 09/27/2017 2327   LABSPEC 1.019 09/27/2017 2327   PHURINE 5.0 09/27/2017 2327   GLUCOSEU NEGATIVE 09/27/2017 2327   HGBUR SMALL (A) 09/27/2017 2327   BILIRUBINUR NEGATIVE 09/27/2017  2327   KETONESUR NEGATIVE 09/27/2017 2327   PROTEINUR NEGATIVE 09/27/2017 2327   NITRITE NEGATIVE 09/27/2017 2327   LEUKOCYTESUR NEGATIVE 09/27/2017 2327   Sepsis Labs Invalid input(s): PROCALCITONIN,  WBC,  LACTICIDVEN Microbiology No results found for this or any previous visit (from the past 240 hour(s)).   Time coordinating discharge: Over 30 minutes  SIGNED:   Pennie Banter, DO [facility] 06/07/2019, 10:37 AM   If 7PM-7AM, please contact night-coverage [email or web address]

## 2019-06-07 ENCOUNTER — Ambulatory Visit: Payer: BC Managed Care – PPO | Admitting: Urology

## 2019-06-09 ENCOUNTER — Other Ambulatory Visit: Payer: Self-pay | Admitting: Family Medicine

## 2019-06-09 DIAGNOSIS — F411 Generalized anxiety disorder: Secondary | ICD-10-CM

## 2019-06-09 DIAGNOSIS — F330 Major depressive disorder, recurrent, mild: Secondary | ICD-10-CM

## 2019-06-09 DIAGNOSIS — R000 Tachycardia, unspecified: Secondary | ICD-10-CM

## 2019-06-09 NOTE — Telephone Encounter (Signed)
Requested Prescriptions  Pending Prescriptions Disp Refills  . carvedilol (COREG) 3.125 MG tablet [Pharmacy Med Name: CARVEDILOL 3.125MG  TABLETS] 180 tablet 1    Sig: TAKE 1 TABLET(3.125 MG) BY MOUTH TWICE DAILY WITH A MEAL     Cardiovascular:  Beta Blockers Passed - 06/09/2019  6:34 AM      Passed - Last BP in normal range    BP Readings from Last 1 Encounters:  06/01/19 (!) 110/58         Passed - Last Heart Rate in normal range    Pulse Readings from Last 1 Encounters:  06/01/19 97         Passed - Valid encounter within last 6 months    Recent Outpatient Visits          1 month ago Esophageal dysphagia   [facility] [place], Danna Hefty, MD   4 months ago Mild episode of recurrent major depressive disorder [facility])   [facility] [place], Danna Hefty, MD   7 months ago Mild episode of recurrent major depressive disorder [facility])   [facility] Alba Cory, MD   8 months ago Supraclavicular adenopathy   [facility] Sharyon Cable E, NP   10 months ago Obstructive sleep apnea syndrome   [facility] Alba Cory, MD      Future Appointments            In 1 week MacDiarmid, Lorin Picket, MD [facility]   In 1 week Wyline Mood, MD [facility]   In 1 month Alba Cory, MD [facility]

## 2019-06-09 NOTE — Telephone Encounter (Signed)
Requested medication (s) are due for refill today - yes   Requested medication (s) are on the active medication list -yes  Future visit scheduled -yes  Last refill: 11/10/18  Notes to clinic: Patient reports she is taking medication differently than prescribed- sent for review of request- patient reports taking less than prescribed.  Requested Prescriptions  Pending Prescriptions Disp Refills   PARoxetine (PAXIL) 20 MG tablet [Pharmacy Med Name: PAROXETINE 20MG  TABLETS] 270 tablet 0    Sig: TAKE 3 TABLETS(60 MG) BY MOUTH EVERY MORNING      Psychiatry:  Antidepressants - SSRI Passed - 06/09/2019 12:42 PM      Passed - Completed PHQ-2 or PHQ-9 in the last 360 days.      Passed - Valid encounter within last 6 months    Recent Outpatient Visits           1 month ago Esophageal dysphagia   [facility], Leugnies, MD   4 months ago Mild episode of recurrent major depressive disorder [facility])   [facility], Leugnies, MD   7 months ago Mild episode of recurrent major depressive disorder [facility])   [facility], MD   8 months ago Supraclavicular adenopathy   [facility] E, NP   10 months ago Obstructive sleep apnea syndrome   [facility], MD       Future Appointments             In 1 week MacDiarmid, Alba Cory, MD [facility]   In 1 week [facility], MD [facility] [place]   In 1 month Wyline Mood, MD [facility], [facility]                Requested Prescriptions  Pending Prescriptions Disp Refills   PARoxetine (PAXIL) 20 MG tablet [Pharmacy Med Name: PAROXETINE 20MG  TABLETS] 270 tablet 0    Sig: TAKE 3 TABLETS(60 MG) BY MOUTH EVERY MORNING      Psychiatry:  Antidepressants - SSRI Passed - 06/09/2019 12:42 PM      Passed - Completed PHQ-2 or PHQ-9 in the last 360 days.      Passed  - Valid encounter within last 6 months    Recent Outpatient Visits           1 month ago Esophageal dysphagia   [facility] [place], [facility], MD   4 months ago Mild episode of recurrent major depressive disorder [facility])   [facility], MD   7 months ago Mild episode of recurrent major depressive disorder [facility])   [facility], MD   8 months ago Supraclavicular adenopathy   [facility] E, NP   10 months ago Obstructive sleep apnea syndrome   [facility], MD       Future Appointments             In 1 week MacDiarmid, [facility], MD [facility]   In 1 week Lorin Picket, MD [facility] [place]   In 1 month [facility], MD [facility], [facility]

## 2019-06-11 ENCOUNTER — Telehealth: Payer: Self-pay | Admitting: General Practice

## 2019-06-11 ENCOUNTER — Other Ambulatory Visit: Payer: Self-pay | Admitting: Family Medicine

## 2019-06-11 DIAGNOSIS — F411 Generalized anxiety disorder: Secondary | ICD-10-CM

## 2019-06-11 DIAGNOSIS — F330 Major depressive disorder, recurrent, mild: Secondary | ICD-10-CM

## 2019-06-11 MED ORDER — PAROXETINE HCL 20 MG PO TABS: 60.0000 mg | ORAL_TABLET | Freq: Every day | ORAL | 0 refills | Status: DC

## 2019-06-11 NOTE — Telephone Encounter (Signed)
Patient got medication refill yesterday for  PARoxetine (PAXIL) 40 MG tablet  Patient states that medication should be 20mg .  Patient states that she takes 3 pills a day.  Patient call back [phone] [id number] - [phone], Nicholes Rough - [address] AT [facility] [address] [facility] [address]  [address] [place], [place]

## 2019-06-17 LAB — CUP PACEART REMOTE DEVICE CHECK
Date Time Interrogation Session: 20210428232716
Implantable Pulse Generator Implant Date: 20181103

## 2019-06-21 ENCOUNTER — Ambulatory Visit: Payer: BC Managed Care – PPO | Admitting: Urology

## 2019-06-21 ENCOUNTER — Encounter: Payer: Self-pay | Admitting: Urology

## 2019-06-21 ENCOUNTER — Ambulatory Visit (INDEPENDENT_AMBULATORY_CARE_PROVIDER_SITE_OTHER): Payer: BC Managed Care – PPO | Admitting: *Deleted

## 2019-06-21 ENCOUNTER — Other Ambulatory Visit: Payer: Self-pay

## 2019-06-21 VITALS — BP 116/78 | HR 96 | Ht 68.0 in | Wt 194.0 lb

## 2019-06-21 DIAGNOSIS — N3946 Mixed incontinence: Secondary | ICD-10-CM | POA: Diagnosis not present

## 2019-06-21 DIAGNOSIS — I639 Cerebral infarction, unspecified: Secondary | ICD-10-CM | POA: Diagnosis not present

## 2019-06-21 DIAGNOSIS — K862 Cyst of pancreas: Secondary | ICD-10-CM | POA: Diagnosis not present

## 2019-06-21 DIAGNOSIS — R978 Other abnormal tumor markers: Secondary | ICD-10-CM | POA: Diagnosis not present

## 2019-06-21 DIAGNOSIS — N200 Calculus of kidney: Secondary | ICD-10-CM

## 2019-06-21 LAB — BLADDER SCAN AMB NON-IMAGING: Scan Result: 0

## 2019-06-21 NOTE — Patient Instructions (Signed)
Urodynamic Testing °What is urodynamic testing? ° °Urodynamic tests are done to determine how well your lower urinary tract is working. The lower urinary tract includes your bladder and the part of your body that drains urine from the bladder (urethra). °When your kidneys filter your blood, urine is stored in your bladder until you feel the urge to urinate. Urination requires coordination between the nerves and muscles of your bladder and urethra. When your lower urinary tract is working well, you should be able to: °· Start urinating when your bladder is full. °· Empty your bladder completely. °· Control the flow of your urine. °Why do I need urodynamic testing? °You may need urodynamic testing to help find the cause of any of these problems: °· Leaking urine (incontinence). °· Problems starting or stopping your urine flow. °· Frequent or painful urination. °· Frequent urinary tract infections. °· Being unable to empty your bladder completely. °· Having strong urges to pass urine (urgency). °· Having a weak flow of urine. °How do I prepare for the tests? °· Ask your health care provider about changing or stopping your regular medicines. This is especially important if you are taking diabetes medicines or blood thinners. °· You may be asked to avoid urinating before coming to the test so that you arrive with a full bladder. °· Tell a health care provider about: °? Any allergies you have. °? All medicines you are taking, including vitamins, herbs, eye drops, creams, and over-the-counter medicines. °? Whether you are pregnant or may be pregnant. °What are the risks of this testing? °Generally, these tests are safe. However, some of the tests have risks, including: °· Discomfort. °· Frequent urge to urinate. °· Bleeding. °· Infection. °· Allergic reactions to medicines or dyes (contrast material). °How is urodynamic testing done? °You may have various urodynamic tests. The tests may be done separately or may all be  done during one testing visit. You may be given an antibiotic medicine before or after testing to help prevent infection. °The types of tests that may be done include: °Uroflowmetry °This test measures how much urine you pass and how long it takes to pass. °· You will urinate into a certain type of toilet or device (flowmeter). °· The device will measure the volume and the time of your urine flow. °· These measurements will be sent to a computer that creates a graph of your urine flow. °Postvoid residual measurement °This test measures how much urine is left in your bladder after you urinate. °· The test may be done with ultrasound. In this method, sound waves and a computer will be used to create an image of your bladder. °· The test can also be done by inserting a thin, flexible tube (catheter) into your bladder after you urinate. The remaining urine will be removed through the catheter so it can be measured. °· Remaining urine will be measured in milliliters (mL). If you have more than 100 mL left in your bladder after you urinate, your bladder is not emptying as it should. °Cystometric testing °This test uses a type of bladder catheter that can measure pressure. °· You may be given a medicine to numb the area (local anesthetic). °· The area around the opening of your urethra will be cleaned. °· A urinary catheter will be passed through your urethra into your bladder and used to empty your bladder completely. °· Then a measuring catheter will be placed, and your bladder will be filled with warm, germ-free (sterile) water. °·   Pressure measurements will be taken: °? As your bladder fills. °? When you feel the need to urinate. °? As your bladder is emptied. °· You may be asked to cough or bear down to check for leakage. °· In some cases, your bladder may be filled with a material that shows up on X-rays (contrast material) so that X-ray pictures can be taken during the test. °Electromyogram °This test measures the  electrical activity of the nerves and muscles of your bladder and the opening of your urethra. °· Sticky patches (electrodes) will be placed near your rectum and urethra to measure electrical activity. °· The measurements will show how well your nerves are communicating with your muscles. °What happens after the testing? °· You should be able to go home right away and do your usual activities. °· You may be told to drink a glass of water every 30 minutes for the first 2 hours after testing. °· Taking a warm bath or using warm, wet cloths (warm compresses) may relieve any discomfort near your urethra. °· Contact your health care provider if you have: °? Pain. °? Blood in your urine. °? Chills. °? Fever. °What do the results mean? °Talk with your health care provider about what your results mean. Some common causes for abnormal results from urodynamic tests include: °· Enlarged prostate in men. °· Overactive bladder. °· Urinary tract infection. °· Nervous system diseases. °· Spinal cord damage. °Questions to ask your health care provider °Ask your health care provider, or the department that is doing the test: °· When will my results be ready? °· How will I get my results? °· What are my treatment options? °· What other tests do I need? °· What are my next steps? °Summary °· Urodynamic tests are done to determine how well your lower urinary tract is working. The lower urinary tract includes your bladder and urethra. °· You may need urodynamic testing to help find the cause of various problems with urination, such as leaking urine (incontinence) or problems starting or stopping your urine flow. °· You may have various urodynamic tests. The tests may be done separately or may all be done during one testing visit. °· Talk with your health care provider about what your results mean. °· Contact your health care provider if you have pain, chills, a fever, or blood in your urine. °This information is not intended to replace  advice given to you by your health care provider. Make sure you discuss any questions you have with your health care provider. °Document Revised: 05/26/2018 Document Reviewed: 12/09/2016 °Elsevier Patient Education © 2020 [employer] Inc. ° °

## 2019-06-21 NOTE — Progress Notes (Signed)
06/21/2019 2:45 PM   [name] Note Houde 1970/11/26 [id number]  Referring provider: Alba Cory, MD [address] [place],  [place] [address]  Chief Complaint  Patient presents with  . Nephrolithiasis    HPI: Patient was having right-sided pain and had her gallbladder out and now the pain is much better.  She was told she might have a stone or cyst in her right kidney and a collapsed bladder.  At baseline she gets up 2 or 3 times a night and voids every 2-3 hours during the day.  Patient can leak with coughing sneezing laughing.  She limits her running because of it.  Rarely she has urge incontinence.  She has no bedwetting.  Does not wear a pad  No previous bladder infections or bladder surgery.  No firm diagnosis of kidney stones in the past     PMH: Past Medical History:  Diagnosis Date  . Anemia   . Anxiety   . Depression   . Dyspnea    SINCE STROKE  . GERD (gastroesophageal reflux disease)   . Hyperlipidemia   . Hypertension   . IBS (irritable bowel syndrome)   . Migraine   . Previous cesarean delivery, delivered, with or without mention of antepartum condition 01/19/2012  . Psychogenic nonepileptic seizure    hx/notes 12/19/2016-LAST SEIZURE IN 2019-ON NO MEDS AS OF 05-24-19  . Restless leg syndrome   . Sleep apnea    USES CPAP  . Stroke (HCC) 12/18/2016   Acute arterial ischemic stroke, multifocal, posterior circulation /notes 12/19/2016-LEFT SIDED WEAKNESS, MEMORY FATIGUE AND TROUBLE FINDING HER WORDS [facility]    Surgical History: Past Surgical History:  Procedure Laterality Date  . ABDOMINOPLASTY  Feb. 2015  . CESAREAN SECTION  2011  . CHOLECYSTECTOMY N/A 05/31/2019   Procedure: LAPAROSCOPIC CHOLECYSTECTOMY WITH INTRAOPERATIVE CHOLANGIOGRAM;  Surgeon: Earline Mayotte, MD;  Location: [facility] ORS;  Service: General;  Laterality: N/A;  . COLONOSCOPY WITH PROPOFOL N/A 11/26/2016   Procedure: COLONOSCOPY WITH PROPOFOL;  Surgeon: Wyline Mood, MD;   [facility];  Service: Gastroenterology;  Laterality: N/A;  . CYSTOSCOPY  02/02/2015   Procedure: CYSTOSCOPY;  Surgeon: Nadara Mustard, MD;  Location: [facility] ORS;  Service: Gynecology;;  . Joya Gaskins AND CURETTAGE OF UTERUS  2003, 2005, 2008  . ESOPHAGOGASTRODUODENOSCOPY N/A 05/31/2019   Procedure: ESOPHAGOGASTRODUODENOSCOPY (EGD);  Surgeon: Earline Mayotte, MD;  Location: [facility] ORS;  Service: General;  Laterality: N/A;  in the O.R.  . ESOPHAGOGASTRODUODENOSCOPY (EGD) WITH PROPOFOL N/A 11/26/2016   Procedure: ESOPHAGOGASTRODUODENOSCOPY (EGD) WITH PROPOFOL;  Surgeon: Wyline Mood, MD;  [facility];  Service: Gastroenterology;  Laterality: N/A;  . HERNIA REPAIR  1999   Umbilical  . KNEE ARTHROSCOPY Right 2006  . LAPAROSCOPIC BILATERAL SALPINGECTOMY Bilateral 02/02/2015   Procedure: LAPAROSCOPIC BILATERAL SALPINGECTOMY;  Surgeon: Nadara Mustard, MD;  Location: [facility] ORS;  Service: Gynecology;  Laterality: Bilateral;  . LAPAROSCOPIC HYSTERECTOMY N/A 02/02/2015   Procedure: HYSTERECTOMY TOTAL LAPAROSCOPIC;  Surgeon: Nadara Mustard, MD;  Location: [facility] ORS;  Service: Gynecology;  Laterality: N/A;  . LOOP RECORDER INSERTION N/A 12/20/2016   Procedure: LOOP RECORDER INSERTION;  Surgeon: Regan Lemming, MD;  [facility];  Service: Cardiovascular;  Laterality: N/A;  . REDUCTION MAMMAPLASTY Bilateral December 2015  . TEE WITHOUT CARDIOVERSION N/A 12/20/2016   Procedure: TRANSESOPHAGEAL ECHOCARDIOGRAM (TEE);  Surgeon: Lars Masson, MD;  Location[facility];  Service: Cardiovascular;  Laterality: N/A;    Home Medications:  Allergies as  of 06/21/2019      Reactions   Azithromycin Diarrhea   Penicillins Hives, Swelling   Has patient had a PCN reaction causing immediate rash, facial/tongue/throat swelling, SOB or lightheadedness with hypotension: Yes Has patient had a PCN reaction causing severe rash involving mucus membranes or skin necrosis: No Has  patient had a PCN reaction that required hospitalization No Has patient had a PCN reaction occurring within the last 10 years: No If all of the above answers are "NO", then may proceed with Cephalosporin use.      Medication List       Accurate as of Jun 21, 2019  2:45 PM. If you have any questions, ask your nurse or doctor.        STOP taking these medications   HYDROcodone-acetaminophen 5-325 MG tablet Commonly known as: NORCO/VICODIN Stopped by: Reece Packer, MD     TAKE these medications   ALPRAZolam 1 MG 24 hr tablet Commonly known as: XANAX XR Take 1 tablet (1 mg total) by mouth daily. What changed: when to take this   aspirin EC 81 MG tablet Take 81 mg by mouth daily.   carvedilol 3.125 MG tablet Commonly known as: COREG TAKE 1 TABLET(3.125 MG) BY MOUTH TWICE DAILY WITH A MEAL   cyclobenzaprine 10 MG tablet Commonly known as: FLEXERIL Take 1 tablet (10 mg total) by mouth at bedtime. What changed: when to take this   OVER THE COUNTER MEDICATION Take 1 tablet by mouth daily. Previtalize   pantoprazole 40 MG tablet Commonly known as: PROTONIX Take 1 tablet (40 mg total) by mouth daily. What changed: when to take this   PARoxetine 20 MG tablet Commonly known as: PAXIL Take 3 tablets (60 mg total) by mouth daily.   rosuvastatin 20 MG tablet Commonly known as: CRESTOR Take 1 tablet (20 mg total) by mouth daily. What changed: when to take this   Ubrelvy 100 MG Tabs Generic drug: Ubrogepant Take 100 mg by mouth daily as needed (migraine).   Vascepa 1 g capsule Generic drug: icosapent Ethyl Take 1 capsule (1 g total) by mouth 2 (two) times daily. What changed: when to take this   Vitamin D (Ergocalciferol) 1.25 MG (50000 UNIT) Caps capsule Commonly known as: DRISDOL TAKE 1 CAPSULE BY MOUTH EVERY 7 DAYS What changed: See the new instructions.   zolpidem 10 MG tablet Commonly known as: Ambien Take 1 tablet (10 mg total) by mouth at bedtime as  needed for sleep.       Allergies:  Allergies  Allergen Reactions  . Azithromycin Diarrhea  . Penicillins Hives and Swelling    Has patient had a PCN reaction causing immediate rash, facial/tongue/throat swelling, SOB or lightheadedness with hypotension: Yes Has patient had a PCN reaction causing severe rash involving mucus membranes or skin necrosis: No Has patient had a PCN reaction that required hospitalization No Has patient had a PCN reaction occurring within the last 10 years: No If all of the above answers are "NO", then may proceed with Cephalosporin use.    Family History: Family History  Problem Relation Age of Onset  . Hypertension Mother   . Hyperlipidemia Mother   . Heart Problems Father        hole in heart and lower ventricles reversed  . Prostate cancer Maternal Grandfather   . Von Willebrand disease Maternal Uncle     Social History:  reports that she has never smoked. She has never used smokeless tobacco. She reports that she  does not drink alcohol or use drugs.  ROS:                                        Physical Exam: BP 116/78   Pulse 96   Ht 5\' 8"  (1.727 m)   Wt 88 kg   LMP 01/15/2015 (Exact Date)   BMI 29.50 kg/m   Constitutional:  Alert and oriented, No acute distress. HEENT: [place] AT, moist mucus membranes.  Trachea midline, no masses. Cardiovascular: No clubbing, cyanosis, or edema. Respiratory: Normal respiratory effort, no increased work of breathing. GI: Abdomen is soft, nontender, nondistended, no abdominal masses GU: No CVA tenderness.  No CVA tenderness; mild grade 2 hypermobility the bladder neck and negative cough test.  Grade 1 cystocele.  No rectocele.  No incontinence Skin: No rashes, bruises or suspicious lesions. Lymph: No cervical or inguinal adenopathy. Neurologic: Grossly intact, no focal deficits, moving all 4 extremities. Psychiatric: Normal mood and affect.  Laboratory Data: Lab Results  Component  Value Date   WBC 11.7 (H) 06/01/2019   HGB 13.0 06/01/2019   HCT 37.5 06/01/2019   MCV 89.3 06/01/2019   PLT 248 06/01/2019    Lab Results  Component Value Date   CREATININE 0.94 06/01/2019    No results found for: PSA  No results found for: TESTOSTERONE  Lab Results  Component Value Date   HGBA1C 5.7 (H) 05/04/2019    Urinalysis    Component Value Date/Time   COLORURINE YELLOW (A) 09/27/2017 2327   APPEARANCEUR CLEAR (A) 09/27/2017 2327   LABSPEC 1.019 09/27/2017 2327   PHURINE 5.0 09/27/2017 2327   GLUCOSEU NEGATIVE 09/27/2017 2327   HGBUR SMALL (A) 09/27/2017 2327   BILIRUBINUR NEGATIVE 09/27/2017 2327   KETONESUR NEGATIVE 09/27/2017 2327   PROTEINUR NEGATIVE 09/27/2017 2327   NITRITE NEGATIVE 09/27/2017 2327   LEUKOCYTESUR NEGATIVE 09/27/2017 2327    Pertinent Imaging: Noted above urine noted.  Chart reviewed.  CT scan reviewed.  Urodynamics ordered   Assessment & Plan: Picture drawn regarding stone.  Watchful waiting recommended.  Pathophysiology of incontinence and role of urodynamics discussed.  Role of physical therapy and watchful waiting discussed.  Patient understands the clots and means emptying on CT scan report.  She actually would like to proceed with urodynamics.  She does limit her running because of her mild incontinence.  I was little bit surprised that she did not choose watchful waiting.  1. Kidney stones  - Urinalysis, Complete - BLADDER SCAN AMB NON-IMAGING   No follow-ups on file.  [id number], MD  [facility] [address], [address] [place], [place] [phone]

## 2019-06-21 NOTE — Addendum Note (Signed)
Addended by: Veneta Penton on: 06/21/2019 03:48 PM   Modules accepted: Orders

## 2019-06-21 NOTE — Addendum Note (Signed)
Addended by: Milas Kocher A on: 06/21/2019 03:16 PM   Modules accepted: Orders

## 2019-06-22 ENCOUNTER — Encounter: Payer: Self-pay | Admitting: *Deleted

## 2019-06-22 ENCOUNTER — Ambulatory Visit: Payer: Self-pay | Admitting: Gastroenterology

## 2019-06-22 NOTE — Progress Notes (Signed)
[facility] Summary Report / Loop Recorder 

## 2019-06-29 DIAGNOSIS — R3915 Urgency of urination: Secondary | ICD-10-CM | POA: Diagnosis not present

## 2019-06-29 DIAGNOSIS — N3946 Mixed incontinence: Secondary | ICD-10-CM | POA: Diagnosis not present

## 2019-06-29 DIAGNOSIS — R350 Frequency of micturition: Secondary | ICD-10-CM | POA: Diagnosis not present

## 2019-06-30 ENCOUNTER — Other Ambulatory Visit: Payer: Self-pay | Admitting: Urology

## 2019-07-01 ENCOUNTER — Other Ambulatory Visit: Payer: Self-pay | Admitting: Urology

## 2019-07-20 ENCOUNTER — Ambulatory Visit (INDEPENDENT_AMBULATORY_CARE_PROVIDER_SITE_OTHER): Payer: BC Managed Care – PPO | Admitting: *Deleted

## 2019-07-20 DIAGNOSIS — I639 Cerebral infarction, unspecified: Secondary | ICD-10-CM

## 2019-07-20 LAB — CUP PACEART REMOTE DEVICE CHECK
Date Time Interrogation Session: 20210529233439
Implantable Pulse Generator Implant Date: 20181103

## 2019-07-21 NOTE — Progress Notes (Signed)
[facility] Summary Report / Loop Recorder 

## 2019-07-22 ENCOUNTER — Encounter: Payer: Self-pay | Admitting: Family Medicine

## 2019-07-22 ENCOUNTER — Other Ambulatory Visit: Payer: Self-pay

## 2019-07-22 ENCOUNTER — Ambulatory Visit: Payer: BC Managed Care – PPO | Admitting: Family Medicine

## 2019-07-22 VITALS — BP 92/68 | HR 101 | Temp 97.9°F | Resp 16 | Ht 68.0 in | Wt 196.6 lb

## 2019-07-22 DIAGNOSIS — G8929 Other chronic pain: Secondary | ICD-10-CM

## 2019-07-22 DIAGNOSIS — F5104 Psychophysiologic insomnia: Secondary | ICD-10-CM

## 2019-07-22 DIAGNOSIS — M542 Cervicalgia: Secondary | ICD-10-CM

## 2019-07-22 DIAGNOSIS — K219 Gastro-esophageal reflux disease without esophagitis: Secondary | ICD-10-CM

## 2019-07-22 DIAGNOSIS — E559 Vitamin D deficiency, unspecified: Secondary | ICD-10-CM

## 2019-07-22 DIAGNOSIS — F330 Major depressive disorder, recurrent, mild: Secondary | ICD-10-CM

## 2019-07-22 DIAGNOSIS — F411 Generalized anxiety disorder: Secondary | ICD-10-CM | POA: Diagnosis not present

## 2019-07-22 DIAGNOSIS — E538 Deficiency of other specified B group vitamins: Secondary | ICD-10-CM | POA: Diagnosis not present

## 2019-07-22 DIAGNOSIS — R221 Localized swelling, mass and lump, neck: Secondary | ICD-10-CM

## 2019-07-22 DIAGNOSIS — K862 Cyst of pancreas: Secondary | ICD-10-CM

## 2019-07-22 DIAGNOSIS — E785 Hyperlipidemia, unspecified: Secondary | ICD-10-CM

## 2019-07-22 DIAGNOSIS — R7303 Prediabetes: Secondary | ICD-10-CM

## 2019-07-22 DIAGNOSIS — I776 Arteritis, unspecified: Secondary | ICD-10-CM

## 2019-07-22 DIAGNOSIS — R569 Unspecified convulsions: Secondary | ICD-10-CM

## 2019-07-22 MED ORDER — CYCLOBENZAPRINE HCL 10 MG PO TABS: 10.0000 mg | ORAL_TABLET | Freq: Every evening | ORAL | 1 refills | Status: DC

## 2019-07-22 MED ORDER — VITAMIN D (ERGOCALCIFEROL) 1.25 MG (50000 UNIT) PO CAPS: 50000.0000 [IU] | ORAL_CAPSULE | ORAL | 1 refills | Status: DC

## 2019-07-22 MED ORDER — PAROXETINE HCL 20 MG PO TABS: 60.0000 mg | ORAL_TABLET | Freq: Every day | ORAL | 1 refills | Status: DC

## 2019-07-22 MED ORDER — ALPRAZOLAM ER 1 MG PO TB24: 1.0000 mg | ORAL_TABLET | ORAL | 0 refills | Status: DC

## 2019-07-22 MED ORDER — ICOSAPENT ETHYL 1 G PO CAPS: 1.0000 g | ORAL_CAPSULE | Freq: Three times a day (TID) | ORAL | 1 refills | Status: DC

## 2019-07-22 MED ORDER — ROSUVASTATIN CALCIUM 40 MG PO TABS: 40.0000 mg | ORAL_TABLET | Freq: Every day | ORAL | 1 refills | Status: DC

## 2019-07-22 MED ORDER — ZOLPIDEM TARTRATE ER 12.5 MG PO TBCR: 12.5000 mg | EXTENDED_RELEASE_TABLET | Freq: Every evening | ORAL | 0 refills | Status: DC | PRN

## 2019-07-22 MED ORDER — PANTOPRAZOLE SODIUM 40 MG PO TBEC: 40.0000 mg | DELAYED_RELEASE_TABLET | ORAL | 1 refills | Status: DC

## 2019-07-22 NOTE — Progress Notes (Signed)
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 Abnormal   Collection Time: 06/01/19  6:35 AM  Result Value Ref Range   WBC 11.7 (H) 4.0 - 10.5 K/uL   RBC 4.20 3.87 - 5.11 MIL/uL   Hemoglobin 13.0 12.0 - 15.0 g/dL   HCT 46.9 62.9 - 52.8 %   MCV 89.3 80.0 - 100.0 fL   MCH 31.0 26.0 - 34.0 pg   MCHC 34.7 30.0 - 36.0 g/dL   RDW 41.3 24.4 - 01.0 %   Platelets 248 150 - 400 K/uL   nRBC 0.0 0.0 - 0.2 %    Comment: Performed at [facility], [address], [place], [place] [address]  Basic metabolic panel     Status: Abnormal   Collection Time: 06/01/19  6:35 AM  Result Value Ref Range   Sodium 142 135 - 145 mmol/L   Potassium 4.2 3.5 - 5.1 mmol/L   Chloride 107 98 - 111 mmol/L   CO2 27 22 - 32 mmol/L   Glucose, Bld 110 (H) 70 - 99 mg/dL     Comment: Glucose reference range applies only to samples taken after fasting for at least 8 hours.   BUN 13 6 - 20 mg/dL   Creatinine, Ser 6.64 0.44 - 1.00 mg/dL   Calcium 9.0 8.9 - 40.3 mg/dL   GFR calc non Af Amer >60 >60 mL/min   GFR calc Af Amer >60 >60 mL/min   Anion gap 8 5 - 15    Comment: Performed at [facility], [address] [place], [place] [address]  CUP PACEART REMOTE DEVICE CHECK     Status: None   Collection Time: 06/16/19 11:27 PM  Result Value Ref Range   Date Time Interrogation Session [id number]    Pulse Generator Manufacturer MERM    Pulse Gen Model [id number] Reveal LINQ    Pulse Gen Serial  Number [id number]    Clinic Name [facility]    Implantable Pulse Generator Type ICM/ILR    Implantable Pulse Generator Implant Date [id number]   BLADDER SCAN AMB NON-IMAGING     Status: None   Collection Time: 06/21/19  2:47 PM  Result Value Ref Range   Scan Result 0 ML   CUP PACEART REMOTE DEVICE CHECK     Status: None   Collection Time: 07/17/19 11:34 PM  Result Value Ref Range   Date Time Interrogation Session [id number]    Pulse Generator Manufacturer MERM    Pulse Gen Model [id number] Reveal LINQ    Pulse Gen Serial Number [id number]    Clinic Name [facility]    Implantable Pulse Generator Type ICM/ILR    Implantable Pulse Generator Implant Date [id number]      PHQ2/9: Depression screen [facility] 2/9 07/22/2019 04/28/2019 01/28/2019 11/10/2018 11/10/2018  Decreased Interest 0 1 0 1 0  Down, Depressed, Hopeless 0 0 0 1 0  PHQ - 2 Score 0 1 0 2 0  Altered sleeping 2 1 1 1  0  Tired, decreased energy 0 1 1 1  0  Change in appetite 0 1 2 1  0  Feeling bad or failure about yourself  0 0 0 1 0  Trouble concentrating 0 1 1 1  0  Moving slowly or fidgety/restless 0 0 0 0 0  Suicidal thoughts 0 0 0 0 0  PHQ-9 Score 2 5 5 7  0  Difficult doing work/chores Not difficult at all Somewhat difficult Not difficult at all Somewhat difficult -  Some recent data might be  hidden    phq 9 is negative   Fall Risk: Fall Risk  07/22/2019 04/28/2019 01/28/2019 11/10/2018 09/30/2018  Falls in the past year? 0 0 1 0 0  Number falls in past yr: - 0 1 0 0  Injury with Fall? - 0 0 0 0  Comment - - - - -  Risk for fall due to : - - - - -  Follow up - - - - -     Functional Status Survey: Is the patient deaf or have difficulty hearing?: No Does the patient have difficulty seeing, even when wearing glasses/contacts?: No Does the patient have difficulty concentrating, remembering, or making decisions?: No Does the patient have difficulty walking or climbing stairs?: No Does the patient have difficulty dressing or bathing?: No Does the patient have difficulty doing errands alone such as visiting a doctor's office or shopping?: No    Assessment & Plan  1. Dyslipidemia  - rosuvastatin (CRESTOR) 40 MG tablet; Take 1 tablet (40 mg total) by mouth daily.  Dispense: 90 tablet; Refill: 1 - icosapent Ethyl (VASCEPA) 1 g capsule; Take 1 capsule (1 g total) by mouth in the morning, at noon, and at bedtime.  Dispense: 270 capsule; Refill: 1  2. GAD (generalized anxiety disorder)  - PARoxetine (PAXIL) 20 MG tablet; Take 3 tablets (60 mg total) by mouth daily.  Dispense: 270 tablet; Refill: 1 - ALPRAZolam (XANAX XR) 1 MG 24 hr tablet; Take 1 tablet (1 mg total) by mouth every morning.  Dispense: 90 tablet; Refill: 0  3. Mild episode of recurrent major depressive disorder (HCC)  - PARoxetine (PAXIL) 20 MG tablet; Take 3 tablets (60 mg total) by mouth daily.  Dispense: 270 tablet; Refill: 1  4. Gastric reflux  - pantoprazole (PROTONIX) 40 MG tablet; Take 1 tablet (40 mg total) by mouth every morning.  Dispense: 90 tablet; Refill: 1  5. Chronic neck pain  - cyclobenzaprine (FLEXERIL) 10 MG tablet; Take 1 tablet (10 mg total) by mouth every evening.  Dispense: 90 tablet; Refill: 1  6. B12 deficiency  On supplements  7. Seizure-like activity (HCC)  Under the care of  Dr. Sherryll BurgerShah   8. Cyst of pancreas   9. Vitamin D deficiency  - Vitamin D, Ergocalciferol, (DRISDOL) 1.25 MG (50000 UNIT) CAPS capsule; Take 1 capsule (50,000 Units total) by mouth every 7 (seven) days.  Dispense: 12 capsule; Refill: 1  10. Vasculitis (HCC)  Seen by dermatologist and rheumatologist   11. Pre-diabetes  Discussed diet   12. Chronic insomnia  - zolpidem (AMBIEN CR) 12.5 MG CR tablet; Take 1 tablet (12.5 mg total) by mouth at bedtime as needed for sleep.  Dispense: 30 tablet; Refill: 0  13. Neck fullness  - Ambulatory referral to ENT

## 2019-07-23 NOTE — Addendum Note (Signed)
Addended by: Geralyn Flash D on: 07/23/2019 09:32 AM   Modules accepted: Level of Service

## 2019-07-26 ENCOUNTER — Ambulatory Visit: Payer: Self-pay | Admitting: Urology

## 2019-08-19 ENCOUNTER — Other Ambulatory Visit: Payer: Self-pay | Admitting: Unknown Physician Specialty

## 2019-08-19 ENCOUNTER — Other Ambulatory Visit: Payer: Self-pay | Admitting: Family Medicine

## 2019-08-19 DIAGNOSIS — R221 Localized swelling, mass and lump, neck: Secondary | ICD-10-CM | POA: Diagnosis not present

## 2019-08-19 DIAGNOSIS — F5104 Psychophysiologic insomnia: Secondary | ICD-10-CM

## 2019-08-19 NOTE — Telephone Encounter (Signed)
Requested medication (s) are due for refill today: yes  Requested medication (s) are on the active medication list: yes  Last refill:  07/22/19 # 30 0 refills  Future visit scheduled: yes  Notes to clinic:  not delegated per protocol     Requested Prescriptions  Pending Prescriptions Disp Refills   zolpidem (AMBIEN CR) 12.5 MG CR tablet [Pharmacy Med Name: ZOLPIDEM ER 12.5MG  TABLETS] 30 tablet     Sig: TAKE 1 TABLET(12.5 MG) BY MOUTH AT BEDTIME AS NEEDED FOR SLEEP      Not Delegated - Psychiatry:  Anxiolytics/Hypnotics Failed - 08/19/2019  3:18 PM      Failed - This refill cannot be delegated      Failed - Urine Drug Screen completed in last 360 days.      Passed - Valid encounter within last 6 months    Recent Outpatient Visits           4 weeks ago B12 deficiency   [facility] Alba Cory, MD   3 months ago Esophageal dysphagia   [facility] Ashkum, Danna Hefty, MD   6 months ago Mild episode of recurrent major depressive disorder [facility])   [facility] Alba Cory, MD   9 months ago Mild episode of recurrent major depressive disorder [facility])   [facility] Alba Cory, MD   10 months ago Supraclavicular adenopathy   [facility] Cheryle Horsfall, NP       Future Appointments             In 1 month Alba Cory, MD [facility]

## 2019-08-20 ENCOUNTER — Ambulatory Visit (INDEPENDENT_AMBULATORY_CARE_PROVIDER_SITE_OTHER): Payer: BC Managed Care – PPO | Admitting: *Deleted

## 2019-08-20 DIAGNOSIS — I639 Cerebral infarction, unspecified: Secondary | ICD-10-CM

## 2019-08-20 LAB — CUP PACEART REMOTE DEVICE CHECK
Date Time Interrogation Session: 20210701231546
Implantable Pulse Generator Implant Date: 20181103

## 2019-08-24 NOTE — Progress Notes (Signed)
[facility] Summary Report / Loop Recorder 

## 2019-08-25 ENCOUNTER — Encounter: Payer: Self-pay | Admitting: Family Medicine

## 2019-08-25 ENCOUNTER — Other Ambulatory Visit: Payer: Self-pay | Admitting: Family Medicine

## 2019-08-25 DIAGNOSIS — F5104 Psychophysiologic insomnia: Secondary | ICD-10-CM

## 2019-08-25 MED ORDER — ZOLPIDEM TARTRATE ER 12.5 MG PO TBCR: 12.5000 mg | EXTENDED_RELEASE_TABLET | Freq: Every evening | ORAL | 1 refills | Status: DC | PRN

## 2019-08-30 ENCOUNTER — Other Ambulatory Visit: Payer: Self-pay

## 2019-08-30 ENCOUNTER — Ambulatory Visit
Admission: RE | Admit: 2019-08-30 | Discharge: 2019-08-30 | Disposition: A | Discharge status: Home / Self Care | Payer: BC Managed Care – PPO | Source: Ambulatory Visit | Attending: Unknown Physician Specialty | Admitting: Unknown Physician Specialty

## 2019-08-30 DIAGNOSIS — R221 Localized swelling, mass and lump, neck: Secondary | ICD-10-CM | POA: Diagnosis not present

## 2019-08-30 DIAGNOSIS — R2231 Localized swelling, mass and lump, right upper limb: Secondary | ICD-10-CM | POA: Diagnosis not present

## 2019-08-30 LAB — POCT I-STAT CREATININE: Creatinine, Ser: 1.1 mg/dL — ABNORMAL HIGH (ref 0.44–1.00)

## 2019-08-30 MED ORDER — IOHEXOL 300 MG/ML  SOLN
75.0000 mL | Freq: Once | INTRAMUSCULAR | Status: AC | PRN
  Administered 2019-08-30: 75 mL via INTRAVENOUS

## 2019-09-01 DIAGNOSIS — R221 Localized swelling, mass and lump, neck: Secondary | ICD-10-CM | POA: Diagnosis not present

## 2019-09-25 LAB — CUP PACEART REMOTE DEVICE CHECK
Date Time Interrogation Session: 20210803231606
Implantable Pulse Generator Implant Date: 20181103

## 2019-09-27 ENCOUNTER — Ambulatory Visit (INDEPENDENT_AMBULATORY_CARE_PROVIDER_SITE_OTHER): Payer: BC Managed Care – PPO | Admitting: *Deleted

## 2019-09-27 DIAGNOSIS — I639 Cerebral infarction, unspecified: Secondary | ICD-10-CM | POA: Diagnosis not present

## 2019-09-28 NOTE — Progress Notes (Signed)
[facility] Summary Report / Loop Recorder 
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
## 2019-10-15 ENCOUNTER — Encounter: Payer: Self-pay | Admitting: Family Medicine

## 2019-10-15 ENCOUNTER — Other Ambulatory Visit: Payer: Self-pay

## 2019-10-15 ENCOUNTER — Ambulatory Visit (INDEPENDENT_AMBULATORY_CARE_PROVIDER_SITE_OTHER): Payer: BC Managed Care – PPO | Admitting: Family Medicine

## 2019-10-15 VITALS — BP 118/86 | HR 125 | Temp 98.2°F | Resp 16 | Ht 68.0 in | Wt 202.5 lb

## 2019-10-15 DIAGNOSIS — R7303 Prediabetes: Secondary | ICD-10-CM

## 2019-10-15 DIAGNOSIS — M310 Hypersensitivity angiitis: Secondary | ICD-10-CM

## 2019-10-15 DIAGNOSIS — F5104 Psychophysiologic insomnia: Secondary | ICD-10-CM

## 2019-10-15 DIAGNOSIS — R21 Rash and other nonspecific skin eruption: Secondary | ICD-10-CM

## 2019-10-15 DIAGNOSIS — F330 Major depressive disorder, recurrent, mild: Secondary | ICD-10-CM

## 2019-10-15 DIAGNOSIS — E559 Vitamin D deficiency, unspecified: Secondary | ICD-10-CM

## 2019-10-15 DIAGNOSIS — K130 Diseases of lips: Secondary | ICD-10-CM

## 2019-10-15 DIAGNOSIS — E785 Hyperlipidemia, unspecified: Secondary | ICD-10-CM

## 2019-10-15 DIAGNOSIS — R569 Unspecified convulsions: Secondary | ICD-10-CM

## 2019-10-15 DIAGNOSIS — G894 Chronic pain syndrome: Secondary | ICD-10-CM

## 2019-10-15 DIAGNOSIS — R000 Tachycardia, unspecified: Secondary | ICD-10-CM

## 2019-10-15 DIAGNOSIS — Z23 Encounter for immunization: Secondary | ICD-10-CM | POA: Diagnosis not present

## 2019-10-15 DIAGNOSIS — G43009 Migraine without aura, not intractable, without status migrainosus: Secondary | ICD-10-CM

## 2019-10-15 DIAGNOSIS — E229 Hyperfunction of pituitary gland, unspecified: Secondary | ICD-10-CM

## 2019-10-15 DIAGNOSIS — J683 Other acute and subacute respiratory conditions due to chemicals, gases, fumes and vapors: Secondary | ICD-10-CM

## 2019-10-15 DIAGNOSIS — F411 Generalized anxiety disorder: Secondary | ICD-10-CM

## 2019-10-15 DIAGNOSIS — G4733 Obstructive sleep apnea (adult) (pediatric): Secondary | ICD-10-CM

## 2019-10-15 MED ORDER — CARVEDILOL 3.125 MG PO TABS: 3.1250 mg | ORAL_TABLET | Freq: Two times a day (BID) | ORAL | 1 refills | Status: DC

## 2019-10-15 MED ORDER — NYSTATIN-TRIAMCINOLONE 100000-0.1 UNIT/GM-% EX OINT: 1.0000 "application " | TOPICAL_OINTMENT | Freq: Two times a day (BID) | CUTANEOUS | 0 refills | Status: DC

## 2019-10-15 MED ORDER — ZOLPIDEM TARTRATE ER 12.5 MG PO TBCR: 12.5000 mg | EXTENDED_RELEASE_TABLET | Freq: Every evening | ORAL | 0 refills | Status: DC | PRN

## 2019-10-15 MED ORDER — VALACYCLOVIR HCL 1 G PO TABS: 1000.0000 mg | ORAL_TABLET | Freq: Two times a day (BID) | ORAL | 0 refills | Status: DC

## 2019-10-15 MED ORDER — ALPRAZOLAM ER 1 MG PO TB24: 1.0000 mg | ORAL_TABLET | ORAL | 0 refills | Status: DC

## 2019-10-27 LAB — CUP PACEART REMOTE DEVICE CHECK
Date Time Interrogation Session: 20210905233524
Implantable Pulse Generator Implant Date: 20181103

## 2019-11-01 ENCOUNTER — Ambulatory Visit (INDEPENDENT_AMBULATORY_CARE_PROVIDER_SITE_OTHER): Payer: BC Managed Care – PPO | Admitting: *Deleted

## 2019-11-01 DIAGNOSIS — I639 Cerebral infarction, unspecified: Secondary | ICD-10-CM | POA: Diagnosis not present

## 2019-11-03 NOTE — Progress Notes (Signed)
[facility] Summary Report / Loop Recorder 

## 2019-11-27 LAB — CUP PACEART REMOTE DEVICE CHECK
Date Time Interrogation Session: 20211008233754
Implantable Pulse Generator Implant Date: 20181103

## 2019-12-06 ENCOUNTER — Ambulatory Visit (INDEPENDENT_AMBULATORY_CARE_PROVIDER_SITE_OTHER): Payer: BC Managed Care – PPO

## 2019-12-06 DIAGNOSIS — Z8673 Personal history of transient ischemic attack (TIA), and cerebral infarction without residual deficits: Secondary | ICD-10-CM | POA: Diagnosis not present

## 2019-12-10 NOTE — Progress Notes (Signed)
[facility] Summary Report / Loop Recorder 

## 2019-12-14 DIAGNOSIS — K760 Fatty (change of) liver, not elsewhere classified: Secondary | ICD-10-CM | POA: Diagnosis not present

## 2019-12-14 DIAGNOSIS — K862 Cyst of pancreas: Secondary | ICD-10-CM | POA: Diagnosis not present

## 2019-12-16 ENCOUNTER — Telehealth: Payer: Self-pay

## 2019-12-16 NOTE — Telephone Encounter (Signed)
Pt returning nurse call

## 2019-12-16 NOTE — Telephone Encounter (Signed)
ILR alert received 12/16/19. One tachy event on 12/14/19 @ 09:37 AM, lasted 4 minutes and 7 seconds. Heart rate recorded was 273 bpm, or interference.  Called patient to assess s/s & medication compliance.  No answer, LMOVM.

## 2019-12-16 NOTE — Telephone Encounter (Signed)
Patient reports she was sitting at her desk and work during episode. Patient reports she has been under stress at work and remembers having a short episode of chest tightness. Denies radiation, shortness of breath or other symptoms. Patient states this is not her first time experiencing chest pain like this. Complaint with medications. Patient advised ED precautions. Advised I will forward to Dr. Elberta Fortis for review. Verbalizes understanding.

## 2019-12-17 NOTE — Telephone Encounter (Signed)
Calling patient to advise Dr. Elberta Fortis states no further action at this time. Patient verbalized understanding. Advised if she has any further questions or concerns to please call the device clinic. Verbalized understanding.

## 2019-12-17 NOTE — Telephone Encounter (Signed)
Hold on further therapy. Appears artifact.

## 2019-12-21 DIAGNOSIS — K862 Cyst of pancreas: Secondary | ICD-10-CM | POA: Diagnosis not present

## 2019-12-21 DIAGNOSIS — G8929 Other chronic pain: Secondary | ICD-10-CM | POA: Diagnosis not present

## 2019-12-21 DIAGNOSIS — R109 Unspecified abdominal pain: Secondary | ICD-10-CM | POA: Diagnosis not present

## 2019-12-21 DIAGNOSIS — R140 Abdominal distension (gaseous): Secondary | ICD-10-CM | POA: Diagnosis not present

## 2019-12-27 ENCOUNTER — Ambulatory Visit: Payer: BC Managed Care – PPO | Admitting: Dermatology

## 2019-12-27 ENCOUNTER — Other Ambulatory Visit: Payer: Self-pay

## 2019-12-27 DIAGNOSIS — M310 Hypersensitivity angiitis: Secondary | ICD-10-CM

## 2019-12-27 DIAGNOSIS — L709 Acne, unspecified: Secondary | ICD-10-CM | POA: Diagnosis not present

## 2019-12-27 DIAGNOSIS — L710 Perioral dermatitis: Secondary | ICD-10-CM | POA: Diagnosis not present

## 2019-12-27 DIAGNOSIS — L719 Rosacea, unspecified: Secondary | ICD-10-CM | POA: Diagnosis not present

## 2019-12-27 MED ORDER — DAPSONE 7.5 % EX GEL: CUTANEOUS | 3 refills | Status: DC

## 2019-12-27 NOTE — Progress Notes (Signed)
   Follow-Up Visit   Subjective  Alicia [name] is a 49 y.o. female who presents for the following: Rash (on the face that started in September - she was prescribed Valtrex and Nystatin/TMC cream and it did clear for a few days, but then came back).   The following portions of the chart were reviewed this encounter and updated as appropriate:  Tobacco  Allergies  Meds  Problems  Med Hx  Surg Hx  Fam Hx     Review of Systems:  No other skin or systemic complaints except as noted in HPI or Assessment and Plan.  Objective  Well appearing patient in no apparent distress; mood and affect are within normal limits.  A focused examination was performed including the face . Relevant physical exam findings are noted in the Assessment and Plan.  Objective  Face: Papules and crusts of the perioral area  Images        Assessment & Plan    Perioral dermatitis with Rosacea and Acne - exacerbated by mask wearing. Face Discussed with patient that condition can be chronic and persistent -  Rosacea is a chronic progressive skin condition usually affecting the face of adults. It is treatable but not curable. It sometimes affects the eyes (ocular rosacea) as well. It may respond to topical and/or systemic medication and can flare with stress, sun exposure, alcohol, exercise and some foods.  Start:  Aczone gel BID and sulfa wash from [employer] daily (let sit for 5 mins before washing off)   Sulfacetamide: 9%, Sulfur: 3%, Vehicle: Foaming Wash.  Consider low dose oral antibiotic in the future, but will start with topical treatment first since patient has other numerous complicated conditions. Patient to contact office if not improving in one month and we will send in oral tx.  Dapsone (ACZONE) 7.5 % GEL - Face  History of Leukocytoclastic vasculitis (HCC) Internal Complicated by kidney issues and CVA's -  Continue care and testing with PCP and rheumatologist   Return in  about 2 months (around 02/26/2020).  Maylene Roes, CMA, am acting as scribe for Armida Sans, MD .  Documentation: I have reviewed the above documentation for accuracy and completeness, and I agree with the above.  Armida Sans, MD

## 2019-12-28 ENCOUNTER — Encounter: Payer: Self-pay | Admitting: Dermatology

## 2019-12-29 ENCOUNTER — Telehealth: Payer: Self-pay

## 2019-12-29 MED ORDER — DOXYCYCLINE HYCLATE 20 MG PO TABS: 20.0000 mg | ORAL_TABLET | Freq: Two times a day (BID) | ORAL | 0 refills | Status: AC

## 2019-12-29 NOTE — Telephone Encounter (Signed)
Just saw pt 2 days ago.  Has something changed in past 2 days? Would recommend waiting for topicals, but if condition has worsened and she has not been using anything that would give her a different rash, we can send in Doxycycline 20 mg take 1 po bid with food #60 with 1 refill.   If it calms with treatment, she may stop pills when she gets topicals and see how topicals do alone. Keep follow up appt.  Call if not improving and we may need to see her sooner.

## 2019-12-29 NOTE — Telephone Encounter (Signed)
Patient sent Tresa Endo an online request for Doxycycline due to being in pain. When I called patient to see what was bothersome, she states her face is just on fire. She lightly washes her face with luke warm water but has not put anything on due to medication being shipped.

## 2019-12-29 NOTE — Telephone Encounter (Signed)
Patient advised of information per Dr. Gwen Pounds. She has gotten much worse since seeing him last and not using any new products.   Doxycycline sent in.

## 2019-12-31 ENCOUNTER — Encounter: Payer: Self-pay | Admitting: Family Medicine

## 2020-01-03 ENCOUNTER — Other Ambulatory Visit: Payer: Self-pay

## 2020-01-03 DIAGNOSIS — E041 Nontoxic single thyroid nodule: Secondary | ICD-10-CM
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
## 2020-01-04 ENCOUNTER — Encounter: Payer: Self-pay | Admitting: Family Medicine

## 2020-01-04 ENCOUNTER — Ambulatory Visit (INDEPENDENT_AMBULATORY_CARE_PROVIDER_SITE_OTHER): Payer: BC Managed Care – PPO | Admitting: Family Medicine

## 2020-01-04 ENCOUNTER — Other Ambulatory Visit: Payer: Self-pay

## 2020-01-04 VITALS — BP 116/82 | HR 99 | Temp 98.1°F | Resp 18 | Ht 68.0 in | Wt 207.4 lb

## 2020-01-04 DIAGNOSIS — F5104 Psychophysiologic insomnia: Secondary | ICD-10-CM

## 2020-01-04 DIAGNOSIS — G8929 Other chronic pain: Secondary | ICD-10-CM

## 2020-01-04 DIAGNOSIS — G43009 Migraine without aura, not intractable, without status migrainosus: Secondary | ICD-10-CM

## 2020-01-04 DIAGNOSIS — E559 Vitamin D deficiency, unspecified: Secondary | ICD-10-CM

## 2020-01-04 DIAGNOSIS — Z79899 Other long term (current) drug therapy: Secondary | ICD-10-CM

## 2020-01-04 DIAGNOSIS — L719 Rosacea, unspecified: Secondary | ICD-10-CM

## 2020-01-04 DIAGNOSIS — R000 Tachycardia, unspecified: Secondary | ICD-10-CM

## 2020-01-04 DIAGNOSIS — G4733 Obstructive sleep apnea (adult) (pediatric): Secondary | ICD-10-CM

## 2020-01-04 DIAGNOSIS — M542 Cervicalgia: Secondary | ICD-10-CM

## 2020-01-04 DIAGNOSIS — E785 Hyperlipidemia, unspecified: Secondary | ICD-10-CM

## 2020-01-04 DIAGNOSIS — M310 Hypersensitivity angiitis: Secondary | ICD-10-CM

## 2020-01-04 DIAGNOSIS — F411 Generalized anxiety disorder: Secondary | ICD-10-CM | POA: Diagnosis not present

## 2020-01-04 DIAGNOSIS — E041 Nontoxic single thyroid nodule: Secondary | ICD-10-CM

## 2020-01-04 DIAGNOSIS — R569 Unspecified convulsions: Secondary | ICD-10-CM | POA: Diagnosis not present

## 2020-01-04 DIAGNOSIS — F330 Major depressive disorder, recurrent, mild: Secondary | ICD-10-CM

## 2020-01-04 DIAGNOSIS — R7303 Prediabetes: Secondary | ICD-10-CM

## 2020-01-04 DIAGNOSIS — K219 Gastro-esophageal reflux disease without esophagitis: Secondary | ICD-10-CM

## 2020-01-04 DIAGNOSIS — J683 Other acute and subacute respiratory conditions due to chemicals, gases, fumes and vapors: Secondary | ICD-10-CM

## 2020-01-04 DIAGNOSIS — R7982 Elevated C-reactive protein (CRP): Secondary | ICD-10-CM

## 2020-01-04 DIAGNOSIS — E229 Hyperfunction of pituitary gland, unspecified: Secondary | ICD-10-CM

## 2020-01-04 DIAGNOSIS — G894 Chronic pain syndrome: Secondary | ICD-10-CM

## 2020-01-04 DIAGNOSIS — R3121 Asymptomatic microscopic hematuria: Secondary | ICD-10-CM

## 2020-01-04 MED ORDER — ROSUVASTATIN CALCIUM 40 MG PO TABS: 40.0000 mg | ORAL_TABLET | Freq: Every day | ORAL | 1 refills | Status: DC

## 2020-01-04 MED ORDER — CARVEDILOL 3.125 MG PO TABS: 3.1250 mg | ORAL_TABLET | Freq: Two times a day (BID) | ORAL | 1 refills | Status: DC

## 2020-01-04 MED ORDER — ICOSAPENT ETHYL 1 G PO CAPS: 1.0000 g | ORAL_CAPSULE | Freq: Three times a day (TID) | ORAL | 1 refills | Status: DC

## 2020-01-04 MED ORDER — VITAMIN D (ERGOCALCIFEROL) 1.25 MG (50000 UNIT) PO CAPS: 50000.0000 [IU] | ORAL_CAPSULE | ORAL | 1 refills | Status: DC

## 2020-01-04 MED ORDER — CYCLOBENZAPRINE HCL 10 MG PO TABS: 10.0000 mg | ORAL_TABLET | Freq: Every evening | ORAL | 1 refills | Status: DC

## 2020-01-04 MED ORDER — PANTOPRAZOLE SODIUM 40 MG PO TBEC: 40.0000 mg | DELAYED_RELEASE_TABLET | ORAL | 1 refills | Status: DC

## 2020-01-04 MED ORDER — ALPRAZOLAM ER 1 MG PO TB24: 1.0000 mg | ORAL_TABLET | ORAL | 0 refills | Status: DC

## 2020-01-04 MED ORDER — ZOLPIDEM TARTRATE ER 12.5 MG PO TBCR: 12.5000 mg | EXTENDED_RELEASE_TABLET | Freq: Every evening | ORAL | 0 refills | Status: DC | PRN

## 2020-01-04 MED ORDER — PAROXETINE HCL 20 MG PO TABS: 60.0000 mg | ORAL_TABLET | Freq: Every day | ORAL | 1 refills | Status: DC

## 2020-01-07 ENCOUNTER — Other Ambulatory Visit: Payer: Self-pay | Admitting: Family Medicine

## 2020-01-07 ENCOUNTER — Encounter: Payer: Self-pay | Admitting: Family Medicine

## 2020-01-07 DIAGNOSIS — R197 Diarrhea, unspecified: Secondary | ICD-10-CM

## 2020-01-07 DIAGNOSIS — R1084 Generalized abdominal pain: Secondary | ICD-10-CM

## 2020-01-10 ENCOUNTER — Other Ambulatory Visit: Payer: Self-pay

## 2020-01-10 ENCOUNTER — Ambulatory Visit (INDEPENDENT_AMBULATORY_CARE_PROVIDER_SITE_OTHER): Payer: BC Managed Care – PPO

## 2020-01-10 ENCOUNTER — Ambulatory Visit
Admission: RE | Admit: 2020-01-10 | Discharge: 2020-01-10 | Disposition: A | Discharge status: Home / Self Care | Payer: BC Managed Care – PPO | Source: Ambulatory Visit | Attending: Family Medicine | Admitting: Family Medicine

## 2020-01-10 DIAGNOSIS — E041 Nontoxic single thyroid nodule: Secondary | ICD-10-CM | POA: Diagnosis not present

## 2020-01-10 DIAGNOSIS — R590 Localized enlarged lymph nodes: Secondary | ICD-10-CM | POA: Diagnosis not present

## 2020-01-10 DIAGNOSIS — I639 Cerebral infarction, unspecified: Secondary | ICD-10-CM

## 2020-01-10 LAB — CUP PACEART REMOTE DEVICE CHECK
Date Time Interrogation Session: 20211121233524
Implantable Pulse Generator Implant Date: 20181103

## 2020-01-12 NOTE — Progress Notes (Signed)
[facility] Summary Report / Loop Recorder 

## 2020-01-31 ENCOUNTER — Telehealth: Payer: Self-pay | Admitting: Medical

## 2020-01-31 ENCOUNTER — Other Ambulatory Visit: Payer: Self-pay | Admitting: Family Medicine

## 2020-01-31 ENCOUNTER — Ambulatory Visit: Payer: Self-pay

## 2020-01-31 DIAGNOSIS — E785 Hyperlipidemia, unspecified: Secondary | ICD-10-CM

## 2020-01-31 NOTE — Telephone Encounter (Signed)
Requested medication (s) are due for refill today - yes  Requested medication (s) are on the active medication list -yes  Future visit scheduled -yes  Last refill: 10/26/19  Notes to clinic: Request medication not assigned protocol.  Requested Prescriptions  Pending Prescriptions Disp Refills   icosapent Ethyl (VASCEPA) 1 g capsule [Pharmacy Med Name: ICOSAPENT ETHYL 1GM CAPSULES] 270 capsule 1    Sig: TAKE 1 CAPSULE BY MOUTH EVERY MORNING, AT NOON, AND AT BEDTIME      Off-Protocol Failed - 01/31/2020 11:17 AM      Failed - Medication not assigned to a protocol, review manually.      Passed - Valid encounter within last 12 months    Recent Outpatient Visits           3 weeks ago GAD (generalized anxiety disorder)   [facility] Alba Cory, MD   3 months ago GAD (generalized anxiety disorder)   [facility] Alba Cory, MD   6 months ago B12 deficiency   [facility] Alba Cory, MD   9 months ago Esophageal dysphagia   [facility], Danna Hefty, MD   1 year ago Mild episode of recurrent major depressive disorder [facility])   [facility] Alba Cory, MD       Future Appointments             In 1 month Deirdre Evener, MD [facility]   In 1 month Alba Cory, MD [facility]                 Requested Prescriptions  Pending Prescriptions Disp Refills   icosapent Ethyl (VASCEPA) 1 g capsule [Pharmacy Med Name: ICOSAPENT ETHYL 1GM CAPSULES] 270 capsule 1    Sig: TAKE 1 CAPSULE BY MOUTH EVERY MORNING, AT NOON, AND AT BEDTIME      Off-Protocol Failed - 01/31/2020 11:17 AM      Failed - Medication not assigned to a protocol, review manually.      Passed - Valid encounter within last 12 months    Recent Outpatient Visits           3 weeks ago GAD (generalized anxiety disorder)   [facility]  Alba Cory, MD   3 months ago GAD (generalized anxiety disorder)   [facility] Alba Cory, MD   6 months ago B12 deficiency   [facility] Alba Cory, MD   9 months ago Esophageal dysphagia   [facility] [place], Danna Hefty, MD   1 year ago Mild episode of recurrent major depressive disorder [facility])   [facility] Alba Cory, MD       Future Appointments             In 1 month Deirdre Evener, MD [facility]   In 1 month Alba Cory, MD [facility]

## 2020-02-04 ENCOUNTER — Emergency Department
Admission: EM | Admit: 2020-02-04 | Discharge: 2020-02-04 | Disposition: A | Discharge status: Home / Self Care | Payer: BC Managed Care – PPO | Source: Home / Self Care | Attending: Emergency Medicine | Admitting: Emergency Medicine

## 2020-02-04 ENCOUNTER — Telehealth (INDEPENDENT_AMBULATORY_CARE_PROVIDER_SITE_OTHER): Payer: BC Managed Care – PPO | Admitting: Internal Medicine

## 2020-02-04 ENCOUNTER — Encounter: Payer: Self-pay | Admitting: Internal Medicine

## 2020-02-04 ENCOUNTER — Emergency Department: Payer: BC Managed Care – PPO

## 2020-02-04 ENCOUNTER — Other Ambulatory Visit: Payer: Self-pay

## 2020-02-04 DIAGNOSIS — I10 Essential (primary) hypertension: Secondary | ICD-10-CM | POA: Diagnosis not present

## 2020-02-04 DIAGNOSIS — K589 Irritable bowel syndrome without diarrhea: Secondary | ICD-10-CM | POA: Diagnosis not present

## 2020-02-04 DIAGNOSIS — G4733 Obstructive sleep apnea (adult) (pediatric): Secondary | ICD-10-CM | POA: Diagnosis not present

## 2020-02-04 DIAGNOSIS — Z881 Allergy status to other antibiotic agents status: Secondary | ICD-10-CM | POA: Diagnosis not present

## 2020-02-04 DIAGNOSIS — R0600 Dyspnea, unspecified: Secondary | ICD-10-CM | POA: Diagnosis not present

## 2020-02-04 DIAGNOSIS — G43909 Migraine, unspecified, not intractable, without status migrainosus: Secondary | ICD-10-CM | POA: Diagnosis present

## 2020-02-04 DIAGNOSIS — R079 Chest pain, unspecified: Secondary | ICD-10-CM

## 2020-02-04 DIAGNOSIS — J189 Pneumonia, unspecified organism: Secondary | ICD-10-CM | POA: Diagnosis not present

## 2020-02-04 DIAGNOSIS — F32A Depression, unspecified: Secondary | ICD-10-CM | POA: Diagnosis not present

## 2020-02-04 DIAGNOSIS — R000 Tachycardia, unspecified: Secondary | ICD-10-CM | POA: Diagnosis not present

## 2020-02-04 DIAGNOSIS — R059 Cough, unspecified: Secondary | ICD-10-CM

## 2020-02-04 DIAGNOSIS — J9601 Acute respiratory failure with hypoxia: Secondary | ICD-10-CM | POA: Diagnosis not present

## 2020-02-04 DIAGNOSIS — I951 Orthostatic hypotension: Secondary | ICD-10-CM | POA: Diagnosis not present

## 2020-02-04 DIAGNOSIS — G2581 Restless legs syndrome: Secondary | ICD-10-CM | POA: Diagnosis not present

## 2020-02-04 DIAGNOSIS — E785 Hyperlipidemia, unspecified: Secondary | ICD-10-CM | POA: Diagnosis not present

## 2020-02-04 DIAGNOSIS — J1282 Pneumonia due to coronavirus disease 2019: Secondary | ICD-10-CM | POA: Diagnosis not present

## 2020-02-04 DIAGNOSIS — R918 Other nonspecific abnormal finding of lung field: Secondary | ICD-10-CM | POA: Diagnosis not present

## 2020-02-04 DIAGNOSIS — R5381 Other malaise: Secondary | ICD-10-CM | POA: Diagnosis present

## 2020-02-04 DIAGNOSIS — R791 Abnormal coagulation profile: Secondary | ICD-10-CM | POA: Diagnosis not present

## 2020-02-04 DIAGNOSIS — R109 Unspecified abdominal pain: Secondary | ICD-10-CM | POA: Diagnosis not present

## 2020-02-04 DIAGNOSIS — M549 Dorsalgia, unspecified: Secondary | ICD-10-CM | POA: Diagnosis not present

## 2020-02-04 DIAGNOSIS — M79605 Pain in left leg: Secondary | ICD-10-CM | POA: Diagnosis not present

## 2020-02-04 DIAGNOSIS — R7989 Other specified abnormal findings of blood chemistry: Secondary | ICD-10-CM | POA: Diagnosis not present

## 2020-02-04 DIAGNOSIS — D72819 Decreased white blood cell count, unspecified: Secondary | ICD-10-CM | POA: Diagnosis not present

## 2020-02-04 DIAGNOSIS — R52 Pain, unspecified: Secondary | ICD-10-CM | POA: Diagnosis not present

## 2020-02-04 DIAGNOSIS — U071 COVID-19: Secondary | ICD-10-CM

## 2020-02-04 DIAGNOSIS — J9811 Atelectasis: Secondary | ICD-10-CM | POA: Diagnosis not present

## 2020-02-04 DIAGNOSIS — F419 Anxiety disorder, unspecified: Secondary | ICD-10-CM | POA: Diagnosis not present

## 2020-02-04 DIAGNOSIS — M546 Pain in thoracic spine: Secondary | ICD-10-CM | POA: Diagnosis not present

## 2020-02-04 DIAGNOSIS — R600 Localized edema: Secondary | ICD-10-CM | POA: Diagnosis not present

## 2020-02-04 DIAGNOSIS — K219 Gastro-esophageal reflux disease without esophagitis: Secondary | ICD-10-CM | POA: Diagnosis not present

## 2020-02-04 DIAGNOSIS — M79604 Pain in right leg: Secondary | ICD-10-CM | POA: Diagnosis not present

## 2020-02-04 DIAGNOSIS — R040 Epistaxis: Secondary | ICD-10-CM | POA: Diagnosis not present

## 2020-02-04 DIAGNOSIS — I639 Cerebral infarction, unspecified: Secondary | ICD-10-CM | POA: Diagnosis not present

## 2020-02-04 DIAGNOSIS — Z98891 History of uterine scar from previous surgery: Secondary | ICD-10-CM | POA: Diagnosis not present

## 2020-02-04 DIAGNOSIS — R0902 Hypoxemia: Secondary | ICD-10-CM | POA: Diagnosis not present

## 2020-02-04 DIAGNOSIS — Z880 Allergy status to penicillin: Secondary | ICD-10-CM | POA: Diagnosis not present

## 2020-02-04 DIAGNOSIS — J90 Pleural effusion, not elsewhere classified: Secondary | ICD-10-CM | POA: Diagnosis not present

## 2020-02-04 DIAGNOSIS — Z79899 Other long term (current) drug therapy: Secondary | ICD-10-CM | POA: Insufficient documentation

## 2020-02-04 DIAGNOSIS — Z9049 Acquired absence of other specified parts of digestive tract: Secondary | ICD-10-CM | POA: Diagnosis not present

## 2020-02-04 DIAGNOSIS — Z8673 Personal history of transient ischemic attack (TIA), and cerebral infarction without residual deficits: Secondary | ICD-10-CM | POA: Diagnosis not present

## 2020-02-04 DIAGNOSIS — K862 Cyst of pancreas: Secondary | ICD-10-CM | POA: Diagnosis not present

## 2020-02-04 DIAGNOSIS — R0682 Tachypnea, not elsewhere classified: Secondary | ICD-10-CM | POA: Diagnosis present

## 2020-02-04 DIAGNOSIS — R0602 Shortness of breath: Secondary | ICD-10-CM | POA: Diagnosis not present

## 2020-02-04 DIAGNOSIS — Z7982 Long term (current) use of aspirin: Secondary | ICD-10-CM | POA: Insufficient documentation

## 2020-02-04 DIAGNOSIS — Z209 Contact with and (suspected) exposure to unspecified communicable disease: Secondary | ICD-10-CM | POA: Diagnosis not present

## 2020-02-04 LAB — CBC
HCT: 41.1 % (ref 36.0–46.0)
Hemoglobin: 14.4 g/dL (ref 12.0–15.0)
MCH: 30.8 pg (ref 26.0–34.0)
MCHC: 35 g/dL (ref 30.0–36.0)
MCV: 87.8 fL (ref 80.0–100.0)
Platelets: 192 10*3/uL (ref 150–400)
RBC: 4.68 MIL/uL (ref 3.87–5.11)
RDW: 13.1 % (ref 11.5–15.5)
WBC: 5.1 10*3/uL (ref 4.0–10.5)
nRBC: 0 % (ref 0.0–0.2)

## 2020-02-04 LAB — BASIC METABOLIC PANEL
Anion gap: 12 (ref 5–15)
BUN: 17 mg/dL (ref 6–20)
CO2: 24 mmol/L (ref 22–32)
Calcium: 8.8 mg/dL — ABNORMAL LOW (ref 8.9–10.3)
Chloride: 99 mmol/L (ref 98–111)
Creatinine, Ser: 1.27 mg/dL — ABNORMAL HIGH (ref 0.44–1.00)
GFR, Estimated: 52 mL/min — ABNORMAL LOW (ref >60)
Glucose, Bld: 128 mg/dL — ABNORMAL HIGH (ref 70–99)
Potassium: 3.5 mmol/L (ref 3.5–5.1)
Sodium: 135 mmol/L (ref 135–145)

## 2020-02-04 LAB — FIBRIN DERIVATIVES D-DIMER (ARMC ONLY): Fibrin derivatives D-dimer (ARMC): 490.78 ng/mL (FEU) (ref 0.00–499.00)

## 2020-02-04 LAB — TROPONIN I (HIGH SENSITIVITY): Troponin I (High Sensitivity): 2 ng/L (ref <18)

## 2020-02-04 LAB — LACTIC ACID, PLASMA: Lactic Acid, Venous: 1.3 mmol/L (ref 0.5–1.9)

## 2020-02-04 MED ORDER — DEXAMETHASONE SODIUM PHOSPHATE 10 MG/ML IJ SOLN
8.0000 mg | Freq: Once | INTRAMUSCULAR | Status: AC
  Administered 2020-02-04: 8 mg via INTRAVENOUS
  Filled 2020-02-04: qty 1

## 2020-02-04 MED ORDER — SODIUM CHLORIDE 0.9 % IV SOLN
1000.0000 mL | Freq: Once | INTRAVENOUS | Status: AC
  Administered 2020-02-04: 1000 mL via INTRAVENOUS

## 2020-02-04 MED ORDER — KETOROLAC TROMETHAMINE 30 MG/ML IJ SOLN
30.0000 mg | Freq: Once | INTRAMUSCULAR | Status: AC
  Administered 2020-02-04: 30 mg via INTRAVENOUS
  Filled 2020-02-04: qty 1

## 2020-02-04 NOTE — Discharge Instructions (Signed)
You have been referred for monoclonal antibody infusion. You will be contacted by infusion center, please schedule asap for infusion.

## 2020-02-04 NOTE — ED Notes (Signed)
See triage note. Pt in from primary/urgent care who directed pt to ER to get monoclonal antibody infusion. ER does not currently complete this process. Pt sitting calmly in bed; alert; holding hand to forehead with lights off in room due to HA; resp reg/unlabored; skin dry; flush noted to chest.

## 2020-02-04 NOTE — Progress Notes (Signed)
Name: Alicia [name]   MRN: [id number]    DOB: [birth year]   Date:02/04/2020       Progress Note  Subjective  Chief Complaint  No chief complaint on file.   I connected with  Alicia [name] on 02/04/20 at  9:20 AM EST by telephone and verified that I am speaking with the correct person using two identifiers.  I discussed the limitations, risks, security and privacy concerns of performing an evaluation and management service by telephone and the availability of in person appointments. The patient expressed understanding and agreed to proceed. Staff also discussed with the patient that there may be a patient responsible charge related to this service. Patient Location: In car [facility] Additional Individuals present: none  HPI Patient is a 49 year old female patient of Dr. Carlynn PurlSowles Last visit with her was 01/04/2020 Follows up today with back pain. It is a phone visit, with significant limitations noted as a result.  It was converted to a phone visit as when she came into the office, she had significant URI symptoms and her son tested positive for Covid on Monday (has been isolated since last Friday), lives with them.  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 Tuesday with sharp pain in the middle of the back, lessened a little Wed and worse again yesterday and hurts at times with deep breaths. A constant pain. Sharp and notes very uncomfortable, feels in the center of her back to her chest. + cough, no marked production, No marked SOB, intermittently No fever, possible chills last night, no sweats + sore throat early, better now + congestion in sinuses, blocked No loss of smell, loss of taste No N/V, appetite down, and difficulty swallowing, can't get self to swallow  +  muscle aches in legs and arms No  marked loose stools - Tuesday , went twice, not since No CP on left, noted a tightness, no passing out episodes  Comorbid conditions reviewed + asthma - has inhaler (proair) for reactive asthma Had CVA's,  + HD - has a loop recorder inplanted  No h/o DM, No tob history   Patient Active Problem List   Diagnosis Date Noted  . Reactive airways dysfunction syndrome (HCC) 10/15/2019  . Hyperfunction of pituitary gland, unspecified (HCC) 10/15/2019  . Abdominal pain 05/31/2019  . Calculus of gallbladder without cholecystitis without obstruction 05/06/2019  . Cyst of pancreas 05/06/2019  . Right kidney stone 05/06/2019  . Leukocytoclastic vasculitis (HCC) 10/07/2018  . Chronic insomnia 02/09/2018  . History of anemia 02/09/2018  . Sleep apnea 09/24/2017  . History of stroke 05/30/2017  . Hyperlipidemia 05/30/2017  . Migraine without aura and without status migrainosus, not intractable 05/27/2017  . Pain in right knee 01/21/2017  . Psychogenic nonepileptic seizure   . Chronic pain syndrome   . Restless leg syndrome   . Chronic prescription benzodiazepine use 01/20/2016  . Leukocytosis 01/20/2016  . Seizure-like activity (HCC)   . GAD (generalized anxiety disorder) 02/14/2015  . Chronic neck pain 02/14/2015  . History of hysterectomy 02/14/2015  . Grieving 12/16/2014  . Iron deficiency anemia due to chronic blood loss 08/29/2014  . Insomnia, persistent 08/14/2014  . Major depression (HCC) 08/14/2014  . Temporomandibular joint sounds on opening and/or closing the jaw 08/14/2014  . Degenerative disc disease, lumbar 08/14/2014  .  Bleeding internal hemorrhoids 08/14/2014  . Gastric reflux 08/14/2014  . Blood glucose elevated 08/14/2014  . Irritable bowel syndrome with constipation 08/14/2014  . Hypertriglyceridemia 08/14/2014  . Overweight 08/14/2014  . Tinnitus 08/14/2014  . Vitamin D deficiency 08/14/2014  . Tachycardia 11/25/2012  . DOE (dyspnea on exertion) 11/06/2012     Past Surgical History:  Procedure Laterality Date  . ABDOMINOPLASTY  Feb. 2015  . CESAREAN SECTION  2011  . CHOLECYSTECTOMY N/A 05/31/2019   Procedure: LAPAROSCOPIC CHOLECYSTECTOMY WITH INTRAOPERATIVE CHOLANGIOGRAM;  Surgeon: Earline Mayotte, MD;  Location: [facility] ORS;  Service: General;  Laterality: N/A;  . COLONOSCOPY WITH PROPOFOL N/A 11/26/2016   Procedure: COLONOSCOPY WITH PROPOFOL;  Surgeon: Wyline Mood, MD;  [facility] ENDOSCOPY;  Service: Gastroenterology;  Laterality: N/A;  . CYSTOSCOPY  02/02/2015   Procedure: CYSTOSCOPY;  Surgeon: Nadara Mustard, MD;  Location: [facility] ORS;  Service: Gynecology;;  . Joya Gaskins AND CURETTAGE OF UTERUS  2003, 2005, 2008  . ESOPHAGOGASTRODUODENOSCOPY N/A 05/31/2019   Procedure: ESOPHAGOGASTRODUODENOSCOPY (EGD);  Surgeon: Earline Mayotte, MD;  Location: [facility] ORS;  Service: General;  Laterality: N/A;  in the O.R.  . ESOPHAGOGASTRODUODENOSCOPY (EGD) WITH PROPOFOL N/A 11/26/2016   Procedure: ESOPHAGOGASTRODUODENOSCOPY (EGD) WITH PROPOFOL;  Surgeon: Wyline Mood, MD;  Location: [facility] ENDOSCOPY;  Service: Gastroenterology;  Laterality: N/A;  . HERNIA REPAIR  1999   Umbilical  . KNEE ARTHROSCOPY Right 2006  . LAPAROSCOPIC BILATERAL SALPINGECTOMY Bilateral 02/02/2015   Procedure: LAPAROSCOPIC BILATERAL SALPINGECTOMY;  Surgeon: Nadara Mustard, MD;  Location: [facility] ORS;  Service: Gynecology;  Laterality: Bilateral;  . LAPAROSCOPIC HYSTERECTOMY N/A 02/02/2015   Procedure: HYSTERECTOMY TOTAL LAPAROSCOPIC;  Surgeon: Nadara Mustard, MD;  Location: [facility] ORS;  Service: Gynecology;  Laterality: N/A;  . LOOP RECORDER INSERTION N/A 12/20/2016   Procedure: LOOP RECORDER INSERTION;  Surgeon: Regan Lemming, MD;  Location: [facility] INVASIVE CV LAB;  Service: Cardiovascular;  Laterality: N/A;  . REDUCTION MAMMAPLASTY Bilateral December 2015  . TEE WITHOUT CARDIOVERSION N/A 12/20/2016   Procedure: TRANSESOPHAGEAL ECHOCARDIOGRAM (TEE);  Surgeon: Lars Masson, MD;   Location[facility] END[facility]COPY;  Service: Cardiovascular;  Laterality: N/A;    Family History  Problem Relation Age of Onset  . Hypertension Mother   . Hyperlipidemia Mother   . Heart Problems Father        hole in heart and lower ventricles reversed  . Prostate cancer Maternal Grandfather   . Von Willebrand disease Maternal Uncle     Social History   Tobacco Use  . Smoking status: Never Smoker  . Smokeless tobacco: Never Used  Substance Use Topics  . Alcohol use: No    Alcohol/week: 0.0 standard drinks     Current Outpatient Medications:  .  icosapent Ethyl (VASCEPA) 1 g capsule, TAKE 1 CAPSULE BY MOUTH EVERY MORNING, AT NOON, AND AT BEDTIME, Disp: 270 capsule, Rfl: 1 .  ALPRAZolam (XANAX XR) 1 MG 24 hr tablet, Take 1 tablet (1 mg total) by mouth every morning., Disp: 90 tablet, Rfl: 0 .  aspirin EC 81 MG tablet, Take 81 mg by mouth daily., Disp: , Rfl:  .  carvedilol (COREG) 3.125 MG tablet, Take 1 tablet (3.125 mg total) by mouth 2 (two) times daily with a meal., Disp: 180 tablet, Rfl: 1 .  cyclobenzaprine (FLEXERIL) 10 MG tablet, Take 1 tablet (10 mg total) by mouth every evening., Disp: 90 tablet, Rfl: 1 .  Dapsone (ACZONE) 7.5 % GEL, Apply a thin coat to the face BID, Disp: 90 g, Rfl:  3 .  lamoTRIgine (LAMICTAL) 25 MG tablet, Take by mouth., Disp: , Rfl:  .  pantoprazole (PROTONIX) 40 MG tablet, Take 1 tablet (40 mg total) by mouth every morning., Disp: 90 tablet, Rfl: 1 .  PARoxetine (PAXIL) 20 MG tablet, Take 3 tablets (60 mg total) by mouth daily., Disp: 270 tablet, Rfl: 1 .  rosuvastatin (CRESTOR) 40 MG tablet, Take 1 tablet (40 mg total) by mouth daily., Disp: 90 tablet, Rfl: 1 .  UBRELVY 100 MG TABS, Take 100 mg by mouth daily as needed (migraine). , Disp: , Rfl:  .  Vitamin D, Ergocalciferol, (DRISDOL) 1.25 MG (50000 UNIT) CAPS capsule, Take 1 capsule (50,000 Units total) by mouth every 7 (seven) days., Disp: 12 capsule, Rfl: 1 .  zolpidem (AMBIEN CR) 12.5 MG CR tablet, Take  1 tablet (12.5 mg total) by mouth at bedtime as needed for sleep., Disp: 90 tablet, Rfl: 0  Allergies  Allergen Reactions  . Azithromycin Diarrhea  . Penicillins Hives and Swelling    Has patient had a PCN reaction causing immediate rash, facial/tongue/throat swelling, SOB or lightheadedness with hypotension: Yes Has patient had a PCN reaction causing severe rash involving mucus membranes or skin necrosis: No Has patient had a PCN reaction that required hospitalization No Has patient had a PCN reaction occurring within the last 10 years: No If all of the above answers are "NO", then may proceed with Cephalosporin use.    With staff assistance, above reviewed with the patient today.  ROS: As per HPI, otherwise no specific complaints on a limited and focused system review   Objective  Virtual encounter, vitals not obtained.  There is no height or weight on file to calculate BMI.  Physical Exam   Appears in NAD via conversation, had one coughing episode during the conversation Breathing: No obvious respiratory distress. Speaking in complete sentences Neurological: Pt is alert, Spe ech is normal Psychiatric: Patient has a normal mood and affect,  No results found for this or any previous visit (from the past 72 hour(s)).  PHQ2/9: Depression screen [facility] 2/9 02/04/2020 01/04/2020 01/04/2020 10/15/2019 07/22/2019  Decreased Interest 0 1 1 1  0  Down, Depressed, Hopeless 0 1 1 1  0  PHQ - 2 Score 0 2 2 2  0  Altered sleeping 3 0 - 0 2  Tired, decreased energy 3 1 - 1 0  Change in appetite 3 0 - 1 0  Feeling bad or failure about yourself  0 1 - 0 0  Trouble concentrating 0 0 - 0 0  Moving slowly or fidgety/restless 0 0 - 0 0  Suicidal thoughts 0 0 - 0 0  PHQ-9 Score 9 4 - 4 2  Difficult doing work/chores Somewhat difficult Somewhat difficult - Not difficult at all Not difficult at all  Some recent data might be hidden   PHQ-2/9 Result reviewed  Fall Risk: Fall Risk  02/04/2020  01/04/2020 10/15/2019 07/22/2019 04/28/2019  Falls in the past year? 0 1 1 0 0  Number falls in past yr: 0 0 1 - 0  Injury with Fall? 0 0 0 - 0  Comment - - - - -  Risk for fall due to : - - - - -  Follow up - - - - -     Assessment & Plan 1. Acute midline thoracic back pain 2. Chest pain, unspecified type 3. Cough 4. History of exposure to infectious disease Patient describes pain in the mid back that has increased and is  quite severe presently.  Also with infectious symptoms and cough.  Her son was diagnosed with Covid recently as noted above, and he is in the same house with her.  She has tested negative on recent test noted above.  Noted still have concerns for Covid with this significant exposure.  Very concerned with mid back pain, and some chest tightness noted at times.  Also some intermittent dyspnea. She does have a significant cardiac history and noted an implantable loop recorder, as well as prior CVAs.  Noted concerns for possible pneumonia, PE a real concern with her history, possible cardiac ischemic chest pain in the differential. I noted she does need to be seen and evaluated with vital signs, O2 saturation, probable chest x-ray, possible EKG noting the above.  Noted the present limitations here with this assessment, and recommended she proceed to a more urgent setting like an urgent care or emergency room to be further assessed. She stated she did not want to proceed to those settings, and that is why she made the appointment to come to this office to be assessed.  I did note the importance of getting seen and evaluated, in hopes of doing the right thing and helping her get better.  She noted she does several hours to sit around and wait to be seen. Her husband then got on the phone and noted they definitely do not want to go to the emergency room as had concerns for weight times and care there previously, and that an urgent care would likely to send her to the emergency room. I did  note that trying to triage appropriately, and given her above symptoms, emphasized the importance of getting seen and evaluated today.  He noted he would proceed to the urgent care and keep his fingers crossed they would not be referred to an emergency setting.  I do think that is important and await their help with further management.  I discussed the assessment and treatment plan with the patient. The patient was provided an opportunity to ask questions and all were answered. The patient was not happy with the plan and demonstrated an understanding of the instructions.   I provided 20 minutes of non-face-to-face time during this encounter that included discussing at length patient's sx/history, pertinent pmhx, medications, treatment and follow up plan. This time also included the necessary documentation, orders, and chart review.  Jamelle Haring, MD

## 2020-02-04 NOTE — ED Provider Notes (Signed)
[facility] Emergency Department Provider Note   ____________________________________________    I have reviewed the triage vital signs and the nursing notes.   HISTORY  Chief Complaint Back Pain     HPI Alicia [name] Note Fake is a 49 y.o. female with a history as noted below who presents with back pain in the setting of recent COVID-19 diagnosis.  Patient is unvaccinated.  Reports she has been having symptoms for approximately 4 to 5 days.  Complains of body aches, in particular in her upper back.  No shortness of breath reported, no significant pleurisy.  No calf pain or swelling.  Also complains of headaches.  Has not taken anything for this.  Past Medical History:  Diagnosis Date  . Anemia   . Anxiety   . Depression   . Dyspnea    SINCE STROKE  . GERD (gastroesophageal reflux disease)   . Hyperlipidemia   . Hypertension   . IBS (irritable bowel syndrome)   . Migraine   . Previous cesarean delivery, delivered, with or without mention of antepartum condition 01/19/2012  . Psychogenic nonepileptic seizure    hx/notes 12/19/2016-LAST SEIZURE IN 2019-ON NO MEDS AS OF 05-24-19  . Restless leg syndrome   . Sleep apnea    USES CPAP  . Stroke (HCC) 12/18/2016   Acute arterial ischemic stroke, multifocal, posterior circulation /notes 12/19/2016-LEFT SIDED WEAKNESS, MEMORY FATIGUE AND TROUBLE FINDING HER WORDS [facility]    Patient Active Problem List   Diagnosis Date Noted  . Reactive airways dysfunction syndrome (HCC) 10/15/2019  . Hyperfunction of pituitary gland, unspecified (HCC) 10/15/2019  . Abdominal pain 05/31/2019  . Calculus of gallbladder without cholecystitis without obstruction 05/06/2019  . Cyst of pancreas 05/06/2019  . Right kidney stone 05/06/2019  . Leukocytoclastic vasculitis (HCC) 10/07/2018  . Chronic insomnia 02/09/2018  . History of anemia 02/09/2018  . Sleep apnea 09/24/2017  . History of stroke 05/30/2017  . Hyperlipidemia  05/30/2017  . Migraine without aura and without status migrainosus, not intractable 05/27/2017  . Pain in right knee 01/21/2017  . Psychogenic nonepileptic seizure   . Chronic pain syndrome   . Restless leg syndrome   . Chronic prescription benzodiazepine use 01/20/2016  . Leukocytosis 01/20/2016  . Seizure-like activity (HCC)   . GAD (generalized anxiety disorder) 02/14/2015  . Chronic neck pain 02/14/2015  . History of hysterectomy 02/14/2015  . Grieving 12/16/2014  . Iron deficiency anemia due to chronic blood loss 08/29/2014  . Insomnia, persistent 08/14/2014  . Major depression (HCC) 08/14/2014  . Temporomandibular joint sounds on opening and/or closing the jaw 08/14/2014  . Degenerative disc disease, lumbar 08/14/2014  . Bleeding internal hemorrhoids 08/14/2014  . Gastric reflux 08/14/2014  . Blood glucose elevated 08/14/2014  . Irritable bowel syndrome with constipation 08/14/2014  . Hypertriglyceridemia 08/14/2014  . Overweight 08/14/2014  . Tinnitus 08/14/2014  . Vitamin D deficiency 08/14/2014  . Tachycardia 11/25/2012  . DOE (dyspnea on exertion) 11/06/2012    Past Surgical History:  Procedure Laterality Date  . ABDOMINOPLASTY  Feb. 2015  . CESAREAN SECTION  2011  . CHOLECYSTECTOMY N/A 05/31/2019   Procedure: LAPAROSCOPIC CHOLECYSTECTOMY WITH INTRAOPERATIVE CHOLANGIOGRAM;  Surgeon: Earline Mayotte, MD;  Location: [facility] ORS;  Service: General;  Laterality: N/A;  . COLONOSCOPY WITH PROPOFOL N/A 11/26/2016   Procedure: COLONOSCOPY WITH PROPOFOL;  Surgeon: Wyline Mood, MD;  Location: [facility] ENDOSCOPY;  Service: Gastroenterology;  Laterality: N/A;  . CYSTOSCOPY  02/02/2015   Procedure: CYSTOSCOPY;  Surgeon: Molly Maduro  Prudencio Burly, MD;  Location: [facility] ORS;  Service: Gynecology;;  . Joya Gaskins AND CURETTAGE OF UTERUS  2003, 2005, 2008  . ESOPHAGOGASTRODUODENOSCOPY N/A 05/31/2019   Procedure: ESOPHAGOGASTRODUODENOSCOPY (EGD);  Surgeon: Earline Mayotte, MD;  Location: [facility] ORS;   Service: General;  Laterality: N/A;  in the O.R.  . ESOPHAGOGASTRODUODENOSCOPY (EGD) WITH PROPOFOL N/A 11/26/2016   Procedure: ESOPHAGOGASTRODUODENOSCOPY (EGD) WITH PROPOFOL;  Surgeon: Wyline Mood, MD;  Location: [facility] ENDOSCOPY;  Service: Gastroenterology;  Laterality: N/A;  . HERNIA REPAIR  1999   Umbilical  . KNEE ARTHROSCOPY Right 2006  . LAPAROSCOPIC BILATERAL SALPINGECTOMY Bilateral 02/02/2015   Procedure: LAPAROSCOPIC BILATERAL SALPINGECTOMY;  Surgeon: Nadara Mustard, MD;  Location: [facility] ORS;  Service: Gynecology;  Laterality: Bilateral;  . LAPAROSCOPIC HYSTERECTOMY N/A 02/02/2015   Procedure: HYSTERECTOMY TOTAL LAPAROSCOPIC;  Surgeon: Nadara Mustard, MD;  Location: [facility] ORS;  Service: Gynecology;  Laterality: N/A;  . LOOP RECORDER INSERTION N/A 12/20/2016   Procedure: LOOP RECORDER INSERTION;  Surgeon: Regan Lemming, MD;  Location: [facility] INVASIVE CV LAB;  Service: Cardiovascular;  Laterality: N/A;  . REDUCTION MAMMAPLASTY Bilateral December 2015  . TEE WITHOUT CARDIOVERSION N/A 12/20/2016   Procedure: TRANSESOPHAGEAL ECHOCARDIOGRAM (TEE);  Surgeon: Lars Masson, MD;  Location: [facility] ENDOSCOPY;  Service: Cardiovascular;  Laterality: N/A;    Prior to Admission medications   Medication Sig Start Date End Date Taking? Authorizing Provider  icosapent Ethyl (VASCEPA) 1 g capsule TAKE 1 CAPSULE BY MOUTH EVERY MORNING, AT NOON, AND AT BEDTIME 01/31/20   Carlynn Purl, Danna Hefty, MD  ALPRAZolam (XANAX XR) 1 MG 24 hr tablet Take 1 tablet (1 mg total) by mouth every morning. 01/04/20   Alba Cory, MD  aspirin EC 81 MG tablet Take 81 mg by mouth daily.    [provider]  carvedilol (COREG) 3.125 MG tablet Take 1 tablet (3.125 mg total) by mouth 2 (two) times daily with a meal. 01/04/20   Carlynn Purl, Danna Hefty, MD  cyclobenzaprine (FLEXERIL) 10 MG tablet Take 1 tablet (10 mg total) by mouth every evening. 01/04/20   Alba Cory, MD  Dapsone (ACZONE) 7.5 % GEL Apply a thin coat to the face  BID 12/27/19   Deirdre Evener, MD  lamoTRIgine (LAMICTAL) 25 MG tablet Take by mouth. 08/03/19   [provider]  pantoprazole (PROTONIX) 40 MG tablet Take 1 tablet (40 mg total) by mouth every morning. 01/04/20   Alba Cory, MD  PARoxetine (PAXIL) 20 MG tablet Take 3 tablets (60 mg total) by mouth daily. 01/04/20   Alba Cory, MD  rosuvastatin (CRESTOR) 40 MG tablet Take 1 tablet (40 mg total) by mouth daily. 01/04/20   Alba Cory, MD  UBRELVY 100 MG TABS Take 100 mg by mouth daily as needed (migraine).  10/27/18   Lonell Face, MD  Vitamin D, Ergocalciferol, (DRISDOL) 1.25 MG (50000 UNIT) CAPS capsule Take 1 capsule (50,000 Units total) by mouth every 7 (seven) days. 01/04/20   Alba Cory, MD  zolpidem (AMBIEN CR) 12.5 MG CR tablet Take 1 tablet (12.5 mg total) by mouth at bedtime as needed for sleep. 01/04/20   Alba Cory, MD     Allergies Azithromycin and Penicillins  Family History  Problem Relation Age of Onset  . Hypertension Mother   . Hyperlipidemia Mother   . Heart Problems Father        hole in heart and lower ventricles reversed  . Prostate cancer Maternal Grandfather   . Von Willebrand disease Maternal Uncle  Social History Social History   Tobacco Use  . Smoking status: Never Smoker  . Smokeless tobacco: Never Used  Vaping Use  . Vaping Use: Never used  Substance Use Topics  . Alcohol use: No    Alcohol/week: 0.0 standard drinks  . Drug use: No    Review of Systems  Constitutional: Fatigue Eyes: No visual changes.  ENT: No sore throat. Cardiovascular: Denies chest pain. Respiratory: As above Gastrointestinal: No abdominal pain.  No nausea, no vomiting.   Genitourinary: Negative for dysuria. Musculoskeletal: As above Skin: Negative for rash. Neurological: Positive headache, no weakness   ____________________________________________   PHYSICAL EXAM:  VITAL SIGNS: ED Triage Vitals  Enc Vitals Group     BP  02/04/20 1341 103/84     Pulse Rate 02/04/20 1341 (!) 128     Resp 02/04/20 1341 (!) 22     Temp 02/04/20 1341 98.2 F (36.8 C)     Temp Source 02/04/20 1341 Oral     SpO2 02/04/20 1341 99 %     Weight 02/04/20 1342 90.7 kg (200 lb)     Height 02/04/20 1342 1.727 m (5\' 8" )     Head Circumference --      Peak Flow --      Pain Score 02/04/20 1342 10     Pain Loc --      Pain Edu? --      Excl. in GC? --     Constitutional: Alert and oriented.  Eyes: Conjunctivae are normal.  Head: Atraumatic.   Mouth/Throat: Mucous membranes are moist.   Neck:  Painless ROM Cardiovascular: Normal rate, regular rhythm.  Good peripheral circulation. Respiratory: Normal respiratory effort.  No retractions.  Genitourinary: deferred Musculoskeletal: Warm and well perfused Neurologic:  Normal speech and language. No gross focal neurologic deficits are appreciated.  Skin:  Skin is warm, dry and intact. No rash noted. Psychiatric: Mood and affect are normal. Speech and behavior are normal.  ____________________________________________   LABS (all labs ordered are listed, but only abnormal results are displayed)  Labs Reviewed  BASIC METABOLIC PANEL - Abnormal; Notable for the following components:      Result Value   Glucose, Bld 128 (*)    Creatinine, Ser 1.27 (*)    Calcium 8.8 (*)    GFR, Estimated 52 (*)    All other components within normal limits  CBC  LACTIC ACID, PLASMA  FIBRIN DERIVATIVES D-DIMER ([facility] ONLY)  TROPONIN I (HIGH SENSITIVITY)   ____________________________________________  EKG  ED ECG REPORT I, Jene Everyobert Liany Mumpower, the attending physician, personally viewed and interpreted this ECG.  Date: 02/04/2020  Rhythm: Tachycardia QRS Axis: normal Intervals: normal ST/T Wave abnormalities: normal Narrative Interpretation: no evidence of acute ischemia  ____________________________________________  RADIOLOGY  Chest x-ray reviewed by me, right lower lobe  infiltrate ____________________________________________   PROCEDURES  Procedure(s) performed: No  Procedures   Critical Care performed: No ____________________________________________   INITIAL IMPRESSION / ASSESSMENT AND PLAN / ED COURSE  Pertinent labs & imaging results that were available during my care of the patient were reviewed by me and considered in my medical decision making (see chart for details).  Patient presents with fatigue, likely myalgias related to COVID-19.  Back pain and mild chest discomfort.  EKG demonstrates sinus tachycardia, D-dimer is normal.  I suspect her pain is related to COVID-19, she also has evidence of Covid pneumonia which can also be causing pain.  Lab work is overall reassuring, treated with IV Toradol and IV  Decadron with improvement in symptoms.  Oxygenation is over 95%.  Have referred for monoclonal antibody infusion,    ____________________________________________   FINAL CLINICAL IMPRESSION(S) / ED DIAGNOSES  Final diagnoses:  Pneumonia due to COVID-19 virus        Note:  This document was prepared using Dragon voice recognition software and may include unintentional dictation errors.   Jene Every, MD 02/04/20 [phone]

## 2020-02-04 NOTE — ED Notes (Signed)
Says she is still ahving bad migraine-7/10.

## 2020-02-04 NOTE — ED Triage Notes (Signed)
PT to ED from [facility] c/o sharp upper, mid back pain in between shoulder blades. Worse with deep breath.  Does not smoke, no BC. PT is covid positive.

## 2020-02-07 ENCOUNTER — Inpatient Hospital Stay
Admission: EM | Admit: 2020-02-07 | Discharge: 2020-02-18 | DRG: 177 | Disposition: A | Discharge status: Home / Self Care | Payer: BC Managed Care – PPO | Attending: Internal Medicine | Admitting: Internal Medicine

## 2020-02-07 ENCOUNTER — Ambulatory Visit (HOSPITAL_COMMUNITY)
Admission: RE | Admit: 2020-02-07 | Discharge: 2020-02-07 | Disposition: A | Discharge status: Home / Self Care | Payer: BC Managed Care – PPO | Source: Ambulatory Visit | Attending: Pulmonary Disease | Admitting: Pulmonary Disease

## 2020-02-07 ENCOUNTER — Emergency Department: Payer: BC Managed Care – PPO

## 2020-02-07 ENCOUNTER — Other Ambulatory Visit: Payer: Self-pay | Admitting: Physician Assistant

## 2020-02-07 ENCOUNTER — Encounter: Payer: Self-pay | Admitting: Emergency Medicine

## 2020-02-07 ENCOUNTER — Other Ambulatory Visit: Payer: Self-pay

## 2020-02-07 DIAGNOSIS — J1282 Pneumonia due to coronavirus disease 2019: Secondary | ICD-10-CM | POA: Diagnosis not present

## 2020-02-07 DIAGNOSIS — E781 Pure hyperglyceridemia: Secondary | ICD-10-CM | POA: Diagnosis present

## 2020-02-07 DIAGNOSIS — R5381 Other malaise: Secondary | ICD-10-CM | POA: Diagnosis present

## 2020-02-07 DIAGNOSIS — I10 Essential (primary) hypertension: Secondary | ICD-10-CM

## 2020-02-07 DIAGNOSIS — I951 Orthostatic hypotension: Secondary | ICD-10-CM | POA: Diagnosis not present

## 2020-02-07 DIAGNOSIS — K589 Irritable bowel syndrome without diarrhea: Secondary | ICD-10-CM | POA: Diagnosis present

## 2020-02-07 DIAGNOSIS — R0600 Dyspnea, unspecified: Secondary | ICD-10-CM

## 2020-02-07 DIAGNOSIS — F32A Depression, unspecified: Secondary | ICD-10-CM | POA: Diagnosis present

## 2020-02-07 DIAGNOSIS — Z7982 Long term (current) use of aspirin: Secondary | ICD-10-CM

## 2020-02-07 DIAGNOSIS — K219 Gastro-esophageal reflux disease without esophagitis: Secondary | ICD-10-CM | POA: Diagnosis present

## 2020-02-07 DIAGNOSIS — G43909 Migraine, unspecified, not intractable, without status migrainosus: Secondary | ICD-10-CM | POA: Diagnosis present

## 2020-02-07 DIAGNOSIS — D72819 Decreased white blood cell count, unspecified: Secondary | ICD-10-CM | POA: Diagnosis present

## 2020-02-07 DIAGNOSIS — U071 COVID-19: Principal | ICD-10-CM

## 2020-02-07 DIAGNOSIS — Z8673 Personal history of transient ischemic attack (TIA), and cerebral infarction without residual deficits: Secondary | ICD-10-CM | POA: Diagnosis not present

## 2020-02-07 DIAGNOSIS — Z79899 Other long term (current) drug therapy: Secondary | ICD-10-CM

## 2020-02-07 DIAGNOSIS — Z90710 Acquired absence of both cervix and uterus: Secondary | ICD-10-CM

## 2020-02-07 DIAGNOSIS — K862 Cyst of pancreas: Secondary | ICD-10-CM | POA: Diagnosis present

## 2020-02-07 DIAGNOSIS — J9601 Acute respiratory failure with hypoxia: Secondary | ICD-10-CM | POA: Diagnosis present

## 2020-02-07 DIAGNOSIS — Z881 Allergy status to other antibiotic agents status: Secondary | ICD-10-CM

## 2020-02-07 DIAGNOSIS — G4733 Obstructive sleep apnea (adult) (pediatric): Secondary | ICD-10-CM | POA: Diagnosis present

## 2020-02-07 DIAGNOSIS — R569 Unspecified convulsions: Secondary | ICD-10-CM

## 2020-02-07 DIAGNOSIS — R040 Epistaxis: Secondary | ICD-10-CM | POA: Diagnosis not present

## 2020-02-07 DIAGNOSIS — E669 Obesity, unspecified: Secondary | ICD-10-CM | POA: Diagnosis present

## 2020-02-07 DIAGNOSIS — Z880 Allergy status to penicillin: Secondary | ICD-10-CM | POA: Diagnosis not present

## 2020-02-07 DIAGNOSIS — G894 Chronic pain syndrome: Secondary | ICD-10-CM | POA: Diagnosis present

## 2020-02-07 DIAGNOSIS — I639 Cerebral infarction, unspecified: Secondary | ICD-10-CM | POA: Diagnosis not present

## 2020-02-07 DIAGNOSIS — Z8349 Family history of other endocrine, nutritional and metabolic diseases: Secondary | ICD-10-CM

## 2020-02-07 DIAGNOSIS — Z90722 Acquired absence of ovaries, bilateral: Secondary | ICD-10-CM

## 2020-02-07 DIAGNOSIS — R0682 Tachypnea, not elsewhere classified: Secondary | ICD-10-CM | POA: Diagnosis present

## 2020-02-07 DIAGNOSIS — G4700 Insomnia, unspecified: Secondary | ICD-10-CM | POA: Diagnosis present

## 2020-02-07 DIAGNOSIS — Z6831 Body mass index (BMI) 31.0-31.9, adult: Secondary | ICD-10-CM

## 2020-02-07 DIAGNOSIS — G2581 Restless legs syndrome: Secondary | ICD-10-CM | POA: Diagnosis present

## 2020-02-07 DIAGNOSIS — R0902 Hypoxemia: Secondary | ICD-10-CM

## 2020-02-07 DIAGNOSIS — Z8249 Family history of ischemic heart disease and other diseases of the circulatory system: Secondary | ICD-10-CM

## 2020-02-07 DIAGNOSIS — E785 Hyperlipidemia, unspecified: Secondary | ICD-10-CM | POA: Diagnosis present

## 2020-02-07 DIAGNOSIS — F419 Anxiety disorder, unspecified: Secondary | ICD-10-CM | POA: Diagnosis present

## 2020-02-07 DIAGNOSIS — Z9049 Acquired absence of other specified parts of digestive tract: Secondary | ICD-10-CM | POA: Diagnosis not present

## 2020-02-07 DIAGNOSIS — R7989 Other specified abnormal findings of blood chemistry: Secondary | ICD-10-CM

## 2020-02-07 DIAGNOSIS — Z98891 History of uterine scar from previous surgery: Secondary | ICD-10-CM | POA: Diagnosis not present

## 2020-02-07 LAB — BLOOD GAS, VENOUS
Acid-base deficit: 0.5 mmol/L (ref 0.0–2.0)
Bicarbonate: 26.3 mmol/L (ref 20.0–28.0)
O2 Saturation: 59.5 %
Patient temperature: 37
pCO2, Ven: 51 mmHg (ref 44.0–60.0)
pH, Ven: 7.32 (ref 7.250–7.430)
pO2, Ven: 34 mmHg (ref 32.0–45.0)

## 2020-02-07 LAB — COMPREHENSIVE METABOLIC PANEL
ALT: 17 U/L (ref 0–44)
AST: 26 U/L (ref 15–41)
Albumin: 3.3 g/dL — ABNORMAL LOW (ref 3.5–5.0)
Alkaline Phosphatase: 60 U/L (ref 38–126)
Anion gap: 10 (ref 5–15)
BUN: 15 mg/dL (ref 6–20)
CO2: 25 mmol/L (ref 22–32)
Calcium: 7.8 mg/dL — ABNORMAL LOW (ref 8.9–10.3)
Chloride: 103 mmol/L (ref 98–111)
Creatinine, Ser: 0.93 mg/dL (ref 0.44–1.00)
GFR, Estimated: >60 mL/min (ref >60)
Glucose, Bld: 130 mg/dL — ABNORMAL HIGH (ref 70–99)
Potassium: 3.6 mmol/L (ref 3.5–5.1)
Sodium: 138 mmol/L (ref 135–145)
Total Bilirubin: 0.8 mg/dL (ref 0.3–1.2)
Total Protein: 6.3 g/dL — ABNORMAL LOW (ref 6.5–8.1)

## 2020-02-07 LAB — CBC WITH DIFFERENTIAL/PLATELET
Abs Immature Granulocytes: 0.01 10*3/uL (ref 0.00–0.07)
Basophils Absolute: 0 10*3/uL (ref 0.0–0.1)
Basophils Relative: 0 %
Eosinophils Absolute: 0 10*3/uL (ref 0.0–0.5)
Eosinophils Relative: 0 %
HCT: 36.8 % (ref 36.0–46.0)
Hemoglobin: 12.6 g/dL (ref 12.0–15.0)
Immature Granulocytes: 0 %
Lymphocytes Relative: 18 %
Lymphs Abs: 0.6 10*3/uL — ABNORMAL LOW (ref 0.7–4.0)
MCH: 30.7 pg (ref 26.0–34.0)
MCHC: 34.2 g/dL (ref 30.0–36.0)
MCV: 89.5 fL (ref 80.0–100.0)
Monocytes Absolute: 0.1 10*3/uL (ref 0.1–1.0)
Monocytes Relative: 3 %
Neutro Abs: 2.8 10*3/uL (ref 1.7–7.7)
Neutrophils Relative %: 79 %
Platelets: 161 10*3/uL (ref 150–400)
RBC: 4.11 MIL/uL (ref 3.87–5.11)
RDW: 13.5 % (ref 11.5–15.5)
WBC: 3.6 10*3/uL — ABNORMAL LOW (ref 4.0–10.5)
nRBC: 0 % (ref 0.0–0.2)

## 2020-02-07 LAB — LACTIC ACID, PLASMA
Lactic Acid, Venous: 1.6 mmol/L (ref 0.5–1.9)
Lactic Acid, Venous: 3.2 mmol/L (ref 0.5–1.9)

## 2020-02-07 LAB — LIPASE, BLOOD: Lipase: 80 U/L — ABNORMAL HIGH (ref 11–51)

## 2020-02-07 LAB — FIBRIN DERIVATIVES D-DIMER (ARMC ONLY): Fibrin derivatives D-dimer (ARMC): 1223.16 ng/mL (FEU) — ABNORMAL HIGH (ref 0.00–499.00)

## 2020-02-07 LAB — TROPONIN I (HIGH SENSITIVITY)
Troponin I (High Sensitivity): 4 ng/L (ref <18)
Troponin I (High Sensitivity): 6 ng/L (ref <18)

## 2020-02-07 MED ORDER — ONDANSETRON HCL 4 MG/2ML IJ SOLN
4.0000 mg | Freq: Four times a day (QID) | INTRAMUSCULAR | Status: DC | PRN
  Administered 2020-02-09 – 2020-02-12 (×2): 4 mg via INTRAVENOUS
  Filled 2020-02-07 (×3): qty 2

## 2020-02-07 MED ORDER — ALPRAZOLAM ER 1 MG PO TB24
1.0000 mg | ORAL_TABLET | ORAL | Status: DC
  Administered 2020-02-08 – 2020-02-18 (×11): 1 mg via ORAL
  Filled 2020-02-07 (×10): qty 1

## 2020-02-07 MED ORDER — ENOXAPARIN SODIUM 60 MG/0.6ML ~~LOC~~ SOLN
0.5000 mg/kg | Freq: Every day | SUBCUTANEOUS | Status: DC
  Filled 2020-02-07: qty 0.6

## 2020-02-07 MED ORDER — METHYLPREDNISOLONE SODIUM SUCC 125 MG IJ SOLR: 125.0000 mg | Freq: Once | INTRAMUSCULAR | Status: DC | PRN

## 2020-02-07 MED ORDER — METHYLPREDNISOLONE SODIUM SUCC 125 MG IJ SOLR
1.0000 mg/kg | Freq: Two times a day (BID) | INTRAMUSCULAR | Status: AC
  Administered 2020-02-08 – 2020-02-10 (×6): 90.625 mg via INTRAVENOUS
  Filled 2020-02-07 (×5): qty 2

## 2020-02-07 MED ORDER — SODIUM CHLORIDE 0.9 % IV SOLN
200.0000 mg | Freq: Once | INTRAVENOUS | Status: AC
  Administered 2020-02-08: 200 mg via INTRAVENOUS
  Filled 2020-02-07: qty 200

## 2020-02-07 MED ORDER — LAMOTRIGINE 25 MG PO TABS
25.0000 mg | ORAL_TABLET | Freq: Every day | ORAL | Status: DC
  Filled 2020-02-07: qty 1

## 2020-02-07 MED ORDER — SODIUM CHLORIDE 0.9 % IV SOLN: INTRAVENOUS | Status: DC | PRN

## 2020-02-07 MED ORDER — CYCLOBENZAPRINE HCL 10 MG PO TABS
10.0000 mg | ORAL_TABLET | Freq: Every evening | ORAL | Status: DC
  Administered 2020-02-08 – 2020-02-17 (×10): 10 mg via ORAL
  Filled 2020-02-07 (×10): qty 1

## 2020-02-07 MED ORDER — DEXAMETHASONE SODIUM PHOSPHATE 10 MG/ML IJ SOLN
8.0000 mg | Freq: Once | INTRAMUSCULAR | Status: DC
  Administered 2020-02-07: 8 mg via INTRAVENOUS

## 2020-02-07 MED ORDER — MORPHINE SULFATE (PF) 4 MG/ML IV SOLN
4.0000 mg | Freq: Once | INTRAVENOUS | Status: AC
  Administered 2020-02-07: 4 mg via INTRAVENOUS
  Filled 2020-02-07: qty 1

## 2020-02-07 MED ORDER — ONDANSETRON HCL 4 MG/2ML IJ SOLN
4.0000 mg | Freq: Once | INTRAMUSCULAR | Status: AC
  Administered 2020-02-07: 4 mg via INTRAVENOUS
  Filled 2020-02-07: qty 2

## 2020-02-07 MED ORDER — ALBUTEROL SULFATE HFA 108 (90 BASE) MCG/ACT IN AERS: 2.0000 | INHALATION_SPRAY | Freq: Once | RESPIRATORY_TRACT | Status: DC | PRN

## 2020-02-07 MED ORDER — HYDROCOD POLST-CPM POLST ER 10-8 MG/5ML PO SUER
5.0000 mL | Freq: Two times a day (BID) | ORAL | Status: DC | PRN
Start: 2020-02-07 — End: 2020-02-18
  Administered 2020-02-09 – 2020-02-15 (×5): 5 mL via ORAL
  Filled 2020-02-07 (×5): qty 5

## 2020-02-07 MED ORDER — PANTOPRAZOLE SODIUM 40 MG PO TBEC
40.0000 mg | DELAYED_RELEASE_TABLET | ORAL | Status: DC
  Administered 2020-02-08 – 2020-02-18 (×11): 40 mg via ORAL
  Filled 2020-02-07 (×11): qty 1

## 2020-02-07 MED ORDER — ASCORBIC ACID 500 MG PO TABS
500.0000 mg | ORAL_TABLET | Freq: Every day | ORAL | Status: DC
  Administered 2020-02-08 – 2020-02-18 (×11): 500 mg via ORAL
  Filled 2020-02-07 (×11): qty 1

## 2020-02-07 MED ORDER — ROSUVASTATIN CALCIUM 20 MG PO TABS
40.0000 mg | ORAL_TABLET | Freq: Every day | ORAL | Status: DC
  Administered 2020-02-08 – 2020-02-18 (×11): 40 mg via ORAL
  Filled 2020-02-07 (×11): qty 2

## 2020-02-07 MED ORDER — UBROGEPANT 100 MG PO TABS: 100.0000 mg | ORAL_TABLET | Freq: Every day | ORAL | Status: DC | PRN

## 2020-02-07 MED ORDER — ONDANSETRON HCL 4 MG PO TABS: 4.0000 mg | ORAL_TABLET | Freq: Four times a day (QID) | ORAL | Status: DC | PRN

## 2020-02-07 MED ORDER — MAGNESIUM HYDROXIDE 400 MG/5ML PO SUSP
30.0000 mL | Freq: Every day | ORAL | Status: DC | PRN
  Filled 2020-02-07: qty 30

## 2020-02-07 MED ORDER — CARVEDILOL 3.125 MG PO TABS
3.1250 mg | ORAL_TABLET | Freq: Two times a day (BID) | ORAL | Status: DC
  Administered 2020-02-08 – 2020-02-14 (×12): 3.125 mg via ORAL
  Filled 2020-02-07 (×13): qty 1

## 2020-02-07 MED ORDER — FAMOTIDINE IN NACL 20-0.9 MG/50ML-% IV SOLN: 20.0000 mg | Freq: Once | INTRAVENOUS | Status: DC | PRN

## 2020-02-07 MED ORDER — ZOLPIDEM TARTRATE 5 MG PO TABS
5.0000 mg | ORAL_TABLET | Freq: Every evening | ORAL | Status: DC | PRN
  Administered 2020-02-08 – 2020-02-10 (×3): 5 mg via ORAL
  Filled 2020-02-07 (×3): qty 1

## 2020-02-07 MED ORDER — ACETAMINOPHEN 325 MG PO TABS
650.0000 mg | ORAL_TABLET | Freq: Four times a day (QID) | ORAL | Status: DC | PRN
  Administered 2020-02-08 – 2020-02-11 (×3): 650 mg via ORAL
  Filled 2020-02-07 (×2): qty 2

## 2020-02-07 MED ORDER — PAROXETINE HCL 30 MG PO TABS
60.0000 mg | ORAL_TABLET | Freq: Every day | ORAL | Status: DC
  Administered 2020-02-08 – 2020-02-18 (×11): 60 mg via ORAL
  Filled 2020-02-07 (×11): qty 2

## 2020-02-07 MED ORDER — ZINC SULFATE 220 (50 ZN) MG PO CAPS
220.0000 mg | ORAL_CAPSULE | Freq: Every day | ORAL | Status: DC
  Administered 2020-02-08 – 2020-02-18 (×10): 220 mg via ORAL
  Filled 2020-02-07 (×11): qty 1

## 2020-02-07 MED ORDER — SODIUM CHLORIDE 0.9 % IV SOLN: Freq: Once | INTRAVENOUS | Status: AC

## 2020-02-07 MED ORDER — ASPIRIN EC 81 MG PO TBEC
81.0000 mg | DELAYED_RELEASE_TABLET | Freq: Every day | ORAL | Status: DC
  Administered 2020-02-08 – 2020-02-18 (×11): 81 mg via ORAL
  Filled 2020-02-07 (×11): qty 1

## 2020-02-07 MED ORDER — ICOSAPENT ETHYL 1 G PO CAPS
1.0000 g | ORAL_CAPSULE | Freq: Three times a day (TID) | ORAL | Status: DC
  Administered 2020-02-08 – 2020-02-18 (×30): 1 g via ORAL
  Filled 2020-02-07 (×42): qty 1

## 2020-02-07 MED ORDER — SODIUM CHLORIDE 0.9 % IV SOLN: 200.0000 mg | Freq: Once | INTRAVENOUS | Status: DC

## 2020-02-07 MED ORDER — VITAMIN D (ERGOCALCIFEROL) 1.25 MG (50000 UNIT) PO CAPS
50000.0000 [IU] | ORAL_CAPSULE | ORAL | Status: DC
  Administered 2020-02-08 – 2020-02-15 (×2): 50000 [IU] via ORAL
  Filled 2020-02-07 (×2): qty 1

## 2020-02-07 MED ORDER — SODIUM CHLORIDE 0.9 % IV SOLN: INTRAVENOUS | Status: DC

## 2020-02-07 MED ORDER — EPINEPHRINE 0.3 MG/0.3ML IJ SOAJ: 0.3000 mg | Freq: Once | INTRAMUSCULAR | Status: DC | PRN

## 2020-02-07 MED ORDER — DIPHENHYDRAMINE HCL 50 MG/ML IJ SOLN: 50.0000 mg | Freq: Once | INTRAMUSCULAR | Status: DC | PRN

## 2020-02-07 MED ORDER — IOHEXOL 350 MG/ML SOLN
75.0000 mL | Freq: Once | INTRAVENOUS | Status: AC | PRN
  Administered 2020-02-07: 75 mL via INTRAVENOUS

## 2020-02-07 MED ORDER — GUAIFENESIN-DM 100-10 MG/5ML PO SYRP
10.0000 mL | ORAL_SOLUTION | ORAL | Status: DC | PRN
  Administered 2020-02-09 – 2020-02-14 (×5): 10 mL via ORAL
  Filled 2020-02-07 (×5): qty 10

## 2020-02-07 MED ORDER — FAMOTIDINE 20 MG PO TABS
20.0000 mg | ORAL_TABLET | Freq: Two times a day (BID) | ORAL | Status: DC
  Administered 2020-02-08 – 2020-02-18 (×21): 20 mg via ORAL
  Filled 2020-02-07 (×21): qty 1

## 2020-02-07 MED ORDER — SODIUM CHLORIDE 0.9 % IV SOLN: 100.0000 mg | Freq: Every day | INTRAVENOUS | Status: DC

## 2020-02-07 MED ORDER — SODIUM CHLORIDE 0.9 % IV SOLN
100.0000 mg | Freq: Every day | INTRAVENOUS | Status: AC
  Administered 2020-02-08 – 2020-02-11 (×4): 100 mg via INTRAVENOUS
  Filled 2020-02-07 (×5): qty 20

## 2020-02-07 MED ORDER — VITAMIN D 25 MCG (1000 UNIT) PO TABS
1000.0000 [IU] | ORAL_TABLET | Freq: Every day | ORAL | Status: DC
  Administered 2020-02-08 – 2020-02-18 (×11): 1000 [IU] via ORAL
  Filled 2020-02-07 (×11): qty 1

## 2020-02-07 MED ORDER — TRAZODONE HCL 50 MG PO TABS: 25.0000 mg | ORAL_TABLET | Freq: Every evening | ORAL | Status: DC | PRN

## 2020-02-07 MED ORDER — PREDNISONE 50 MG PO TABS
50.0000 mg | ORAL_TABLET | Freq: Every day | ORAL | Status: DC
  Administered 2020-02-11 – 2020-02-17 (×7): 50 mg via ORAL
  Filled 2020-02-07 (×7): qty 1

## 2020-02-07 NOTE — Discharge Instructions (Signed)
10 Things You Can Do to Manage Your COVID-19 Symptoms at Home If you have possible or confirmed COVID-19: 1. Stay home from work and school. And stay away from other public places. If you must go out, avoid using any kind of public transportation, ridesharing, or taxis. 2. Monitor your symptoms carefully. If your symptoms get worse, call your healthcare provider immediately. 3. Get rest and stay hydrated. 4. If you have a medical appointment, call the healthcare provider ahead of time and tell them that you have or may have COVID-19. 5. For medical emergencies, call 911 and notify the dispatch personnel that you have or may have COVID-19. 6. Cover your cough and sneezes with a tissue or use the inside of your elbow. 7. Wash your hands often with soap and water for at least 20 seconds or clean your hands with an alcohol-based hand sanitizer that contains at least 60% alcohol. 8. As much as possible, stay in a specific room and away from other people in your home. Also, you should use a separate bathroom, if available. If you need to be around other people in or outside of the home, wear a mask. 9. Avoid sharing personal items with other people in your household, like dishes, towels, and bedding. 10. Clean all surfaces that are touched often, like counters, tabletops, and doorknobs. Use household cleaning sprays or wipes according to the label instructions. cdc.gov/coronavirus 08/19/2018 This information is not intended to replace advice given to you by your health care provider. Make sure you discuss any questions you have with your health care provider. Document Revised: 01/21/2019 Document Reviewed: 01/21/2019 Elsevier Patient Education  2020 Elsevier Inc. What types of side effects do monoclonal antibody drugs cause?  Common side effects  In general, the more common side effects caused by monoclonal antibody drugs include: . Allergic reactions, such as hives or itching . Flu-like signs and  symptoms, including chills, fatigue, fever, and muscle aches and pains . Nausea, vomiting . Diarrhea . Skin rashes . Low blood pressure   The CDC is recommending patients who receive monoclonal antibody treatments wait at least 90 days before being vaccinated.  Currently, there are no data on the safety and efficacy of mRNA COVID-19 vaccines in persons who received monoclonal antibodies or convalescent plasma as part of COVID-19 treatment. Based on the estimated half-life of such therapies as well as evidence suggesting that reinfection is uncommon in the 90 days after initial infection, vaccination should be deferred for at least 90 days, as a precautionary measure until additional information becomes available, to avoid interference of the antibody treatment with vaccine-induced immune responses. If you have any questions or concerns after the infusion please call the Advanced Practice Provider on call at [phone]. This number is ONLY intended for your use regarding questions or concerns about the infusion post-treatment side-effects.  Please do not provide this number to others for use. For return to work notes please contact your primary care provider.   If someone you know is interested in receiving treatment please have them call the COVID hotline at [phone].   

## 2020-02-07 NOTE — H&P (Signed)
[place]   PATIENT NAME: Alicia [name]    MR#:  [id number]  DATE OF BIRTH:  Dec 12, 1970  DATE OF ADMISSION:  02/07/2020  PRIMARY CARE PHYSICIAN: Alba Cory, MD   REQUESTING/REFERRING PHYSICIAN: Sharyn Creamer, MD CHIEF COMPLAINT:   Chief Complaint  Patient presents with  . Shortness of Breath    HISTORY OF PRESENT ILLNESS:  Alicia [name]  is a 49 y.o. Caucasian female with a known history of anxiety and depression, hypertension, dyslipidemia, irritable bowel syndrome, restless leg syndrome and sleep apnea on CPAP, presented to the emergency room with acute onset of worsening dyspnea with associated dry cough and wheezing.  She started having symptoms on Monday when she woke up with headache and chest congestion then had significant fatigue, mild upper and lower back pain as well as intermittent fever and chills.  She had no loss of taste or appetite however they did not feel normal.  No nausea or vomiting however she had diarrhea on Tuesday.  She has not been vaccinated against COVID-19.  She stated that she was concerned as she has fibromuscular dysplasia and is at high risk for clotting.  Upon presentation to the emergency room, temperature was 99.1, heart rate was 110 and later 134, pulse oximetry is 92% on room air which dropped to 88% on room air.  Labs revealed VBG with pH 7.32, HCO3 26.3 and PCO2 51.  CMP was unremarkable except for albumin of 3.3.  High-sensitivity troponin I was 4 letter 6.  Lactic acid was 1.6 and later 3.2 CBC showed mild leukopenia and lymphopenia.  Fibrin derivatives D-dimer came back 1223.60.  Portable chest ray showed mild bilateral multifocal infiltrates abdominal and pelvic as well as chest CTA revealed multifocal bilateral airspace disease consistent with COVID-19 pneumonia, stable pancreatic cyst previously shown to represent a serous cystadenoma, trace pelvic free fluid likely physiologic, nondiagnostic evaluation of the pulmonary vasculature due  to timing of contrast bolus with pulmonary emboli not being excluded. EKG showed sinus tachycardia with rate 119 with probable left atrial enlargement and nonspecific intraventricular conduction delay.  The patient was given IV monoclonal antibody infusion earlier today and this evening 8 mg of IV Decadron, 4 mg of IV morphine sulfate and 4 mg of IV Zofran and was ordered IV remdesivir.  She will be admitted to a medical monitored bed for further evaluation and management. PAST MEDICAL HISTORY:   Past Medical History:  Diagnosis Date  . Anemia   . Anxiety   . Depression   . Dyspnea    SINCE STROKE  . GERD (gastroesophageal reflux disease)   . Hyperlipidemia   . Hypertension   . IBS (irritable bowel syndrome)   . Migraine   . Previous cesarean delivery, delivered, with or without mention of antepartum condition 01/19/2012  . Psychogenic nonepileptic seizure    hx/notes 12/19/2016-LAST SEIZURE IN 2019-ON NO MEDS AS OF 05-24-19  . Restless leg syndrome   . Sleep apnea    USES CPAP  . Stroke (HCC) 12/18/2016   Acute arterial ischemic stroke, multifocal, posterior circulation /notes 12/19/2016-LEFT SIDED WEAKNESS, MEMORY FATIGUE AND TROUBLE FINDING HER WORDS [facility]    PAST SURGICAL HISTORY:   Past Surgical History:  Procedure Laterality Date  . ABDOMINOPLASTY  Feb. 2015  . CESAREAN SECTION  2011  . CHOLECYSTECTOMY N/A 05/31/2019   Procedure: LAPAROSCOPIC CHOLECYSTECTOMY WITH INTRAOPERATIVE CHOLANGIOGRAM;  Surgeon: Earline Mayotte, MD;  Location: [facility] ORS;  Service: General;  Laterality: N/A;  . COLONOSCOPY WITH  PROPOFOL N/A 11/26/2016   Procedure: COLONOSCOPY WITH PROPOFOL;  Surgeon: Wyline Mood, MD;  Location: [facility] ENDOSCOPY;  Service: Gastroenterology;  Laterality: N/A;  . CYSTOSCOPY  02/02/2015   Procedure: CYSTOSCOPY;  Surgeon: Nadara Mustard, MD;  Location: [facility] ORS;  Service: Gynecology;;  . Joya Gaskins AND CURETTAGE OF UTERUS  2003, 2005, 2008  . ESOPHAGOGASTRODUODENOSCOPY N/A  05/31/2019   Procedure: ESOPHAGOGASTRODUODENOSCOPY (EGD);  Surgeon: Earline Mayotte, MD;  Location: [facility] ORS;  Service: General;  Laterality: N/A;  in the O.R.  . ESOPHAGOGASTRODUODENOSCOPY (EGD) WITH PROPOFOL N/A 11/26/2016   Procedure: ESOPHAGOGASTRODUODENOSCOPY (EGD) WITH PROPOFOL;  Surgeon: Wyline Mood, MD;  Location[facility]COPY;  Service: Gastroenterology;  Laterality: N/A;  . HERNIA REPAIR  1999   Umbilical  . KNEE ARTHROSCOPY Right 2006  . LAPAROSCOPIC BILATERAL SALPINGECTOMY Bilateral 02/02/2015   Procedure: LAPAROSCOPIC BILATERAL SALPINGECTOMY;  Surgeon: Nadara Mustard, MD;  Location: [facility] ORS;  Service: Gynecology;  Laterality: Bilateral;  . LAPAROSCOPIC HYSTERECTOMY N/A 02/02/2015   Procedure: HYSTERECTOMY TOTAL LAPAROSCOPIC;  Surgeon: Nadara Mustard, MD;  Location: [facility] ORS;  Service: Gynecology;  Laterality: N/A;  . LOOP RECORDER INSERTION N/A 12/20/2016   Procedure: LOOP RECORDER INSERTION;  Surgeon: Regan Lemming, MD;  Location: [facility] INVASIVE CV LAB;  Service: Cardiovascular;  Laterality: N/A;  . REDUCTION MAMMAPLASTY Bilateral December 2015  . TEE WITHOUT CARDIOVERSION N/A 12/20/2016   Procedure: TRANSESOPHAGEAL ECHOCARDIOGRAM (TEE);  Surgeon: Lars Masson, MD;  Location[facility] ENDOSCOPY;  Service: Cardiovascular;  Laterality: N/A;    SOCIAL HISTORY:   Social History   Tobacco Use  . Smoking status: Never Smoker  . Smokeless tobacco: Never Used  Substance Use Topics  . Alcohol use: No    Alcohol/week: 0.0 standard drinks    FAMILY HISTORY:   Family History  Problem Relation Age of Onset  . Hypertension Mother   . Hyperlipidemia Mother   . Heart Problems Father        hole in heart and lower ventricles reversed  . Prostate cancer Maternal Grandfather   . Von Willebrand disease Maternal Uncle     DRUG ALLERGIES:   Allergies  Allergen Reactions  . Azithromycin Diarrhea  . Penicillins Hives and Swelling    Has patient had a PCN reaction causing  immediate rash, facial/tongue/throat swelling, SOB or lightheadedness with hypotension: Yes Has patient had a PCN reaction causing severe rash involving mucus membranes or skin necrosis: No Has patient had a PCN reaction that required hospitalization No Has patient had a PCN reaction occurring within the last 10 years: No If all of the above answers are "NO", then may proceed with Cephalosporin use.    REVIEW OF SYSTEMS:   ROS As per history of present illness. All pertinent systems were reviewed above. Constitutional, HEENT, cardiovascular, respiratory, GI, GU, musculoskeletal, neuro, psychiatric, endocrine, integumentary and hematologic systems were reviewed and are otherwise negative/unremarkable except for positive findings mentioned above in the HPI.   MEDICATIONS AT HOME:   Prior to Admission medications   Medication Sig Start Date End Date Taking? Authorizing Provider  ALPRAZolam (XANAX XR) 1 MG 24 hr tablet Take 1 tablet (1 mg total) by mouth every morning. 01/04/20  Yes Alba Cory, MD  aspirin EC 81 MG tablet Take 81 mg by mouth daily.   Yes [provider]  carvedilol (COREG) 3.125 MG tablet Take 1 tablet (3.125 mg total) by mouth 2 (two) times daily with a meal. 01/04/20  Yes Sowles, Danna Hefty, MD  cyclobenzaprine (FLEXERIL) 10 MG tablet  Take 1 tablet (10 mg total) by mouth every evening. 01/04/20  Yes Sowles, Danna Hefty, MD  Dapsone (ACZONE) 7.5 % GEL Apply a thin coat to the face BID 12/27/19  Yes Deirdre Evener, MD  icosapent Ethyl (VASCEPA) 1 g capsule TAKE 1 CAPSULE BY MOUTH EVERY MORNING, AT NOON, AND AT BEDTIME 01/31/20  Yes Sowles, Danna Hefty, MD  lamoTRIgine (LAMICTAL) 25 MG tablet Take by mouth. 08/03/19  Yes [provider]  pantoprazole (PROTONIX) 40 MG tablet Take 1 tablet (40 mg total) by mouth every morning. 01/04/20  Yes Sowles, Danna Hefty, MD  PARoxetine (PAXIL) 20 MG tablet Take 3 tablets (60 mg total) by mouth daily. 01/04/20  Yes Sowles, Danna Hefty,  MD  rosuvastatin (CRESTOR) 40 MG tablet Take 1 tablet (40 mg total) by mouth daily. 01/04/20  Yes Sowles, Danna Hefty, MD  UBRELVY 100 MG TABS Take 100 mg by mouth daily as needed (migraine).  10/27/18  Yes Lonell Face, MD  Vitamin D, Ergocalciferol, (DRISDOL) 1.25 MG (50000 UNIT) CAPS capsule Take 1 capsule (50,000 Units total) by mouth every 7 (seven) days. 01/04/20  Yes Sowles, Danna Hefty, MD  zolpidem (AMBIEN CR) 12.5 MG CR tablet Take 1 tablet (12.5 mg total) by mouth at bedtime as needed for sleep. 01/04/20  Yes Sowles, Danna Hefty, MD      VITAL SIGNS:  Blood pressure (!) 113/44, pulse (!) 121, temperature 99.1 F (37.3 C), resp. rate 19, height  (1.727 m), weight 90.7 kg, last menstrual period 01/15/2015, SpO2 94 %.  PHYSICAL EXAMINATION:  Physical Exam  GENERAL:  49 y.o.-year-old Caucasian female patient lying in the bed with mild respiratory distress with conversational dyspnea. EYES: Pupils equal, round, reactive to light and accommodation. No scleral icterus. Extraocular muscles intact.  HEENT: Head atraumatic, normocephalic. Oropharynx and nasopharynx clear.  NECK:  Supple, no jugular venous distention. No thyroid enlargement, no tenderness.  LUNGS: Diminished bibasal breath sounds with bibasal crackles. CARDIOVASCULAR: Regular rate and rhythm, S1, S2 normal. No murmurs, rubs, or gallops.  ABDOMEN: Soft, nondistended, nontender. Bowel sounds present. No organomegaly or mass.  EXTREMITIES: No pedal edema, cyanosis, or clubbing.  NEUROLOGIC: Cranial nerves II through XII are intact. Muscle strength 5/5 in all extremities. Sensation intact. Gait not checked.  PSYCHIATRIC: The patient is alert and oriented x 3.  Normal affect and good eye contact. SKIN: No obvious rash, lesion, or ulcer.   LABORATORY PANEL:   CBC Recent Labs  Lab 02/07/20 1938  WBC 3.6*  HGB 12.6  HCT 36.8  PLT 161    ------------------------------------------------------------------------------------------------------------------  Chemistries  Recent Labs  Lab 02/07/20 1938  NA 138  K 3.6  CL 103  CO2 25  GLUCOSE 130*  BUN 15  CREATININE 0.93  CALCIUM 7.8*  AST 26  ALT 17  ALKPHOS 60  BILITOT 0.8   ------------------------------------------------------------------------------------------------------------------  Cardiac Enzymes No results for input(s): TROPONINI in the last 168 hours. ------------------------------------------------------------------------------------------------------------------  RADIOLOGY:  CT Angio Chest PE W and/or Wo Contrast  Result Date: 02/07/2020 CLINICAL DATA:  COVID-19 positive 2 days ago, short of breath after antibody infusion, lower abdominal pain, positive D-dimer EXAM: CT ANGIOGRAPHY CHEST CT ABDOMEN AND PELVIS WITH CONTRAST TECHNIQUE: Multidetector CT imaging of the chest was performed using the standard protocol during bolus administration of intravenous contrast. Multiplanar CT image reconstructions and MIPs were obtained to evaluate the vascular anatomy. Multidetector CT imaging of the abdomen and pelvis was performed using the standard protocol during bolus administration of intravenous contrast. CONTRAST:  75mL OMNIPAQUE IOHEXOL 350  MG/ML SOLN COMPARISON:  02/07/2020, 05/05/2019 FINDINGS: CTA CHEST FINDINGS Cardiovascular: There is suboptimal contrast enhancement of the pulmonary vasculature, resulting in a nondiagnostic evaluation for pulmonary embolus. The majority of the contrast is within the systemic arterial system, and the thoracic aorta is normal in appearance. Heart is unremarkable without pericardial effusion. Mediastinum/Nodes: No enlarged mediastinal, hilar, or axillary lymph nodes. Thyroid gland, trachea, and esophagus demonstrate no significant findings. Lungs/Pleura: Multifocal bilateral ground-glass airspace disease consistent with COVID 19  pneumonia. No effusion or pneumothorax. The central airways are patent. Musculoskeletal: No acute or destructive bony lesions. Reconstructed images demonstrate no additional findings. Review of the MIP images confirms the above findings. CT ABDOMEN and PELVIS FINDINGS Hepatobiliary: No focal liver abnormality is seen. Status post cholecystectomy. No biliary dilatation. Pancreas: Stable cysts within the uncinate process of the pancreas measuring 1.6 x 1.0 cm, previously shown to reflect a serous cystadenoma. The remainder of the pancreas is unremarkable. Spleen: Normal in size without focal abnormality. Adrenals/Urinary Tract: The kidneys enhance normally and symmetrically. No evidence of obstructive uropathy or urinary calculus. Bladder is unremarkable. The adrenals are normal. Stomach/Bowel: No bowel obstruction or ileus. Normal appendix. No bowel wall thickening or inflammatory changes. Vascular/Lymphatic: Left-sided IVC again noted, an anatomic variant. No significant atherosclerosis. No pathologic adenopathy. Reproductive: Status post hysterectomy. No adnexal masses. Other: There is trace pelvic free fluid, nonspecific. No free intraperitoneal gas. No abdominal wall hernia. Musculoskeletal: No acute bony abnormalities. Reconstructed images demonstrate no additional findings. Review of the MIP images confirms the above findings. IMPRESSION: 1. Nondiagnostic evaluation of the pulmonary vasculature due to timing of contrast bolus. Pulmonary emboli cannot be excluded. 2. Multifocal bilateral airspace disease consistent with COVID 19 pneumonia. 3. Trace pelvic free fluid, likely physiologic. 4. Stable pancreatic cyst, previously shown to represent a serous cystadenoma. 5. Otherwise no acute intra-abdominal or intrapelvic process. Electronically Signed   By: Sharlet SalinaMichael  Brown M.D.   On: 02/07/2020 21:56   CT ABDOMEN PELVIS W CONTRAST  Result Date: 02/07/2020 CLINICAL DATA:  COVID-19 positive 2 days ago, short of  breath after antibody infusion, lower abdominal pain, positive D-dimer EXAM: CT ANGIOGRAPHY CHEST CT ABDOMEN AND PELVIS WITH CONTRAST TECHNIQUE: Multidetector CT imaging of the chest was performed using the standard protocol during bolus administration of intravenous contrast. Multiplanar CT image reconstructions and MIPs were obtained to evaluate the vascular anatomy. Multidetector CT imaging of the abdomen and pelvis was performed using the standard protocol during bolus administration of intravenous contrast. CONTRAST:  75mL OMNIPAQUE IOHEXOL 350 MG/ML SOLN COMPARISON:  02/07/2020, 05/05/2019 FINDINGS: CTA CHEST FINDINGS Cardiovascular: There is suboptimal contrast enhancement of the pulmonary vasculature, resulting in a nondiagnostic evaluation for pulmonary embolus. The majority of the contrast is within the systemic arterial system, and the thoracic aorta is normal in appearance. Heart is unremarkable without pericardial effusion. Mediastinum/Nodes: No enlarged mediastinal, hilar, or axillary lymph nodes. Thyroid gland, trachea, and esophagus demonstrate no significant findings. Lungs/Pleura: Multifocal bilateral ground-glass airspace disease consistent with COVID 19 pneumonia. No effusion or pneumothorax. The central airways are patent. Musculoskeletal: No acute or destructive bony lesions. Reconstructed images demonstrate no additional findings. Review of the MIP images confirms the above findings. CT ABDOMEN and PELVIS FINDINGS Hepatobiliary: No focal liver abnormality is seen. Status post cholecystectomy. No biliary dilatation. Pancreas: Stable cysts within the uncinate process of the pancreas measuring 1.6 x 1.0 cm, previously shown to reflect a serous cystadenoma. The remainder of the pancreas is unremarkable. Spleen: Normal in size without focal abnormality. Adrenals/Urinary Tract:  The kidneys enhance normally and symmetrically. No evidence of obstructive uropathy or urinary calculus. Bladder is  unremarkable. The adrenals are normal. Stomach/Bowel: No bowel obstruction or ileus. Normal appendix. No bowel wall thickening or inflammatory changes. Vascular/Lymphatic: Left-sided IVC again noted, an anatomic variant. No significant atherosclerosis. No pathologic adenopathy. Reproductive: Status post hysterectomy. No adnexal masses. Other: There is trace pelvic free fluid, nonspecific. No free intraperitoneal gas. No abdominal wall hernia. Musculoskeletal: No acute bony abnormalities. Reconstructed images demonstrate no additional findings. Review of the MIP images confirms the above findings. IMPRESSION: 1. Nondiagnostic evaluation of the pulmonary vasculature due to timing of contrast bolus. Pulmonary emboli cannot be excluded. 2. Multifocal bilateral airspace disease consistent with COVID 19 pneumonia. 3. Trace pelvic free fluid, likely physiologic. 4. Stable pancreatic cyst, previously shown to represent a serous cystadenoma. 5. Otherwise no acute intra-abdominal or intrapelvic process. Electronically Signed   By: Sharlet Salina M.D.   On: 02/07/2020 21:56   DG Chest Portable 1 View  Result Date: 02/07/2020 CLINICAL DATA:  COVID positive with shortness of breath. EXAM: PORTABLE CHEST 1 VIEW COMPARISON:  February 04, 2020 FINDINGS: Mild multifocal infiltrates are seen involving predominantly the periphery of the mid and lower lung fields, bilaterally. There is no evidence of a pleural effusion or pneumothorax. The heart size and mediastinal contours are within normal limits. A radiopaque loop recorder device is seen. The visualized skeletal structures are unremarkable. IMPRESSION: Mild bilateral multifocal infiltrates. Electronically Signed   By: Aram Candela M.D.   On: 02/07/2020 20:06      IMPRESSION AND PLAN:   1.  Acute hypoxemic respiratory failure secondary to COVID-19. -The patient will be admitted to a medically monitored isolation bed. -O2 protocol will be followed to keep O2  saturation above 93.   2.  Multifocal pneumonia secondary to COVID-19. -The patient will be admitted to an isolation monitored bed with droplet and contact precautions. -Given multifocal pneumonia we will empirically place the patient on IV Rocephin and Zithromax for possible bacterial superinfection only with elevated Procalcitonin. -The patient will be placed on scheduled Mucinex and as needed Tussionex. -We will avoid nebulization as much as we can, give bronchodilator MDI if needed. -Given her obstructive sleep apnea she will have CPAP only at night. -Will obtain sputum Gram stain culture and sensitivity and follow blood cultures. -O2 protocol will be followed. -We will follow CRP, ferritin, LDH and D-dimer. -Will follow manual differential for ANC/ALC ratio as well as follow troponin I and daily CBC with manual differential and CMP. - Will place the patient on IV Remdesivir and IV steroid therapy with IV Solu-Medrol with elevated inflammatory markers. -The patient will be placed on vitamin D3, vitamin C, zinc sulfate, p.o. Pepcid and aspirin.  3.  Essential hypertension. -We will continue her Coreg.  4.  Dyslipidemia. -Statin therapy will be resumed.  5.  Anxiety and depression. -We will continue her Paxil, Lamictal, Xanax XR and Ambien.  6.  GERD. -We will continue PPI therapy.  7.  DVT prophylaxis. -Subcutaneous Lovenox at therapeutic dose for now given elevated D-dimer and inconclusive chest CT pending VQ scan in a.m.   All the records are reviewed and case discussed with ED provider. The plan of care was discussed in details with the patient (and family). I answered all questions. The patient agreed to proceed with the above mentioned plan. Further management will depend upon hospital course.   CODE STATUS: Full code  Status is: Inpatient  Remains inpatient appropriate because:Ongoing  diagnostic testing needed not appropriate for outpatient work up, Unsafe d/c plan,  IV treatments appropriate due to intensity of illness or inability to take PO and Inpatient level of care appropriate due to severity of illness   Dispo: The patient is from: Home              Anticipated d/c is to: Home              Anticipated d/c date is: > 3 days              Patient currently is not medically stable to d/c.   TOTAL TIME TAKING CARE OF THIS PATIENT: 55 minutes.    Hannah Beat M.D on 02/07/2020 at 11:36 PM  [facility]   From 7 PM-7 AM, contact night-coverage [email or web address]  CC: Primary care physician; Alba Cory, MD

## 2020-02-07 NOTE — ED Provider Notes (Signed)
[facility] Emergency Department Provider Note   ____________________________________________   Event Date/Time   First MD Initiated Contact with Patient 02/07/20 1936     (approximate)  I have reviewed the triage vital signs and the nursing notes.   HISTORY  Chief Complaint Shortness of Breath    HPI [name] Note Alicia [name] is a 49 y.o. female who was diagnosed with Covid on Friday has become increasingly short of breath even more so after having antibody infusion today. She is not itching although she is aching everywhere. She has pleuritic pain in her chest which is worse than it had been. She has a low-grade fever she is tachycardic as well as feeling short of breath. Please see past medical history below.   Patient reports her arms and legs and everything is hurting.      Past Medical History:  Diagnosis Date   Anemia    Anxiety    Depression    Dyspnea    SINCE STROKE   GERD (gastroesophageal reflux disease)    Hyperlipidemia    Hypertension    IBS (irritable bowel syndrome)    Migraine    Previous cesarean delivery, delivered, with or without mention of antepartum condition 01/19/2012   Psychogenic nonepileptic seizure    hx/notes 12/19/2016-LAST SEIZURE IN 2019-ON NO MEDS AS OF 05-24-19   Restless leg syndrome    Sleep apnea    USES CPAP   Stroke (HCC) 12/18/2016   Acute arterial ischemic stroke, multifocal, posterior circulation /notes 12/19/2016-LEFT SIDED WEAKNESS, MEMORY FATIGUE AND TROUBLE FINDING HER WORDS [facility]    Patient Active Problem List   Diagnosis Date Noted   Reactive airways dysfunction syndrome (HCC) 10/15/2019   Hyperfunction of pituitary gland, unspecified (HCC) 10/15/2019   Abdominal pain 05/31/2019   Calculus of gallbladder without cholecystitis without obstruction 05/06/2019   Cyst of pancreas 05/06/2019   Right kidney stone 05/06/2019   Leukocytoclastic vasculitis (HCC) 10/07/2018   Chronic  insomnia 02/09/2018   History of anemia 02/09/2018   Sleep apnea 09/24/2017   History of stroke 05/30/2017   Hyperlipidemia 05/30/2017   Migraine without aura and without status migrainosus, not intractable 05/27/2017   Pain in right knee 01/21/2017   Psychogenic nonepileptic seizure    Chronic pain syndrome    Restless leg syndrome    Chronic prescription benzodiazepine use 01/20/2016   Leukocytosis 01/20/2016   Seizure-like activity (HCC)    GAD (generalized anxiety disorder) 02/14/2015   Chronic neck pain 02/14/2015   History of hysterectomy 02/14/2015   Grieving 12/16/2014   Iron deficiency anemia due to chronic blood loss 08/29/2014   Insomnia, persistent 08/14/2014   Major depression (HCC) 08/14/2014   Temporomandibular joint sounds on opening and/or closing the jaw 08/14/2014   Degenerative disc disease, lumbar 08/14/2014   Bleeding internal hemorrhoids 08/14/2014   Gastric reflux 08/14/2014   Blood glucose elevated 08/14/2014   Irritable bowel syndrome with constipation 08/14/2014   Hypertriglyceridemia 08/14/2014   Overweight 08/14/2014   Tinnitus 08/14/2014   Vitamin D deficiency 08/14/2014   Tachycardia 11/25/2012   DOE (dyspnea on exertion) 11/06/2012    Past Surgical History:  Procedure Laterality Date   ABDOMINOPLASTY  Feb. 2015   CESAREAN SECTION  2011   CHOLECYSTECTOMY N/A 05/31/2019   Procedure: LAPAROSCOPIC CHOLECYSTECTOMY WITH INTRAOPERATIVE CHOLANGIOGRAM;  Surgeon: Earline Mayotte, MD;  Location: [facility] ORS;  Service: General;  Laterality: N/A;   COLONOSCOPY WITH PROPOFOL N/A 11/26/2016   Procedure: COLONOSCOPY WITH PROPOFOL;  Surgeon: Tobi Bastos,  Sharlet SalinaKiran, MD;  Location: [facility] ENDOSCOPY;  Service: Gastroenterology;  Laterality: N/A;   CYSTOSCOPY  02/02/2015   Procedure: CYSTOSCOPY;  Surgeon: Nadara Mustardobert P Harris, MD;  Location: [facility] ORS;  Service: Gynecology;;   DILATION AND CURETTAGE OF UTERUS  2003, 2005, 2008    ESOPHAGOGASTRODUODENOSCOPY N/A 05/31/2019   Procedure: ESOPHAGOGASTRODUODENOSCOPY (EGD);  Surgeon: Earline MayotteByrnett, Jeffrey W, MD;  Location: [facility] ORS;  Service: General;  Laterality: N/A;  in the O.R.   ESOPHAGOGASTRODUODENOSCOPY (EGD) WITH PROPOFOL N/A 11/26/2016   Procedure: ESOPHAGOGASTRODUODENOSCOPY (EGD) WITH PROPOFOL;  Surgeon: Wyline MoodAnna, Kiran, MD;  [facility];  Service: Gastroenterology;  Laterality: N/A;   HERNIA REPAIR  1999   Umbilical   KNEE ARTHROSCOPY Right 2006   LAPAROSCOPIC BILATERAL SALPINGECTOMY Bilateral 02/02/2015   Procedure: LAPAROSCOPIC BILATERAL SALPINGECTOMY;  Surgeon: Nadara Mustardobert P Harris, MD;  Location: [facility] ORS;  Service: Gynecology;  Laterality: Bilateral;   LAPAROSCOPIC HYSTERECTOMY N/A 02/02/2015   Procedure: HYSTERECTOMY TOTAL LAPAROSCOPIC;  Surgeon: Nadara Mustardobert P Harris, MD;  Location: [facility] ORS;  Service: Gynecology;  Laterality: N/A;   LOOP RECORDER INSERTION N/A 12/20/2016   Procedure: LOOP RECORDER INSERTION;  Surgeon: Regan Lemmingamnitz, Will Martin, MD;  Location: [facility];  Service: Cardiovascular;  Laterality: N/A;   REDUCTION MAMMAPLASTY Bilateral December 2015   TEE WITHOUT CARDIOVERSION N/A 12/20/2016   Procedure: TRANSESOPHAGEAL ECHOCARDIOGRAM (TEE);  Surgeon: Lars MassonNelson, Katarina H, MD;  [facility];  Service: Cardiovascular;  Laterality: N/A;    Prior to Admission medications   Medication Sig Start Date End Date Taking? Authorizing Provider  icosapent Ethyl (VASCEPA) 1 g capsule TAKE 1 CAPSULE BY MOUTH EVERY MORNING, AT NOON, AND AT BEDTIME 01/31/20   Carlynn PurlSowles, Danna HeftyKrichna, MD  ALPRAZolam (XANAX XR) 1 MG 24 hr tablet Take 1 tablet (1 mg total) by mouth every morning. 01/04/20   [name], [name], MD  aspirin EC 81 MG tablet Take 81 mg by mouth daily.    [provider]  carvedilol (COREG) 3.125 MG tablet Take 1 tablet (3.125 mg total) by mouth 2 (two) times daily with a meal. 01/04/20   Carlynn PurlSowles, Danna HeftyKrichna, MD  cyclobenzaprine (FLEXERIL) 10 MG  tablet Take 1 tablet (10 mg total) by mouth every evening. 01/04/20   [name], [name], MD  Dapsone (ACZONE) 7.5 % GEL Apply a thin coat to the face BID 12/27/19   Deirdre EvenerKowalski, David C, MD  lamoTRIgine (LAMICTAL) 25 MG tablet Take by mouth. 08/03/19   [provider]  pantoprazole (PROTONIX) 40 MG tablet Take 1 tablet (40 mg total) by mouth every morning. 01/04/20   [name], [name], MD  PARoxetine (PAXIL) 20 MG tablet Take 3 tablets (60 mg total) by mouth daily. 01/04/20   [name], [name], MD  rosuvastatin (CRESTOR) 40 MG tablet Take 1 tablet (40 mg total) by mouth daily. 01/04/20   [name], [name], MD  UBRELVY 100 MG TABS Take 100 mg by mouth daily as needed (migraine).  10/27/18   Lonell FaceShah, Hemang K, MD  Vitamin D, Ergocalciferol, (DRISDOL) 1.25 MG (50000 UNIT) CAPS capsule Take 1 capsule (50,000 Units total) by mouth every 7 (seven) days. 01/04/20   [name], [name], MD  zolpidem (AMBIEN CR) 12.5 MG CR tablet Take 1 tablet (12.5 mg total) by mouth at bedtime as needed for sleep. 01/04/20   [name], [name], MD    Allergies Azithromycin and Penicillins  Family History  Problem Relation Age of Onset   Hypertension Mother    Hyperlipidemia Mother    Heart Problems Father        hole in  heart and lower ventricles reversed   Prostate cancer Maternal Grandfather    Von Willebrand disease Maternal Uncle     Social History Social History   Tobacco Use   Smoking status: Never Smoker   Smokeless tobacco: Never Used  Building services engineer Use: Never used  Substance Use Topics   Alcohol use: No    Alcohol/week: 0.0 standard drinks   Drug use: No    Review of Systems  Constitutional:  fever/chills Eyes: No visual changes. ENT: No sore throat. Cardiovascular: chest pain. Respiratory:shortness of breath. Gastrointestinal: No abdominal pain.  No nausea, no vomiting.  No diarrhea.  No constipation. Genitourinary: Negative for dysuria. Musculoskeletal: Negative for back  pain. Skin: Negative for rash. Neurological: Negative for headaches, focal weakness   ____________________________________________   PHYSICAL EXAM:  VITAL SIGNS: ED Triage Vitals  Enc Vitals Group     BP 02/07/20 1941 (!) 118/44     Pulse Rate 02/07/20 1937 (!) 120     Resp 02/07/20 1937 20     Temp 02/07/20 1937 99.1 F (37.3 C)     Temp src --      SpO2 02/07/20 1937 95 %     Weight 02/07/20 1939 200 lb (90.7 kg)     Height 02/07/20 1939 5\' 8"  (1.727 m)     Head Circumference --      Peak Flow --      Pain Score 02/07/20 1939 8     Pain Loc --      Pain Edu? --      Excl. in GC? --     Constitutional: Alert and oriented. She looks uncomfortable. She is holding a wet washcloth to her mouth because she says her mouth is dry from breathing so hard. Eyes: Conjunctivae are normal.  Head: Atraumatic. Nose: No congestion/rhinnorhea. Mouth/Throat: Mucous membranes are moist.  Oropharynx non-erythematous. Neck: No stridor.   Cardiovascular: Rapid rate, regular rhythm. Grossly normal heart sounds.  Good peripheral circulation. Respiratory: Normal respiratory effort. Increased respiratory rate no retractions. Lungs CTAB. Gastrointestinal: Soft and nontender. No distention. No abdominal bruits. Musculoskeletal: No lower extremity tenderness nor edema.   Neurologic:  Normal speech and language. No gross focal neurologic deficits are appreciated.  Skin:  Skin is warm, dry and intact. No rash noted.   ____________________________________________   LABS (all labs ordered are listed, but only abnormal results are displayed)  Labs Reviewed  COMPREHENSIVE METABOLIC PANEL - Abnormal; Notable for the following components:      Result Value   Glucose, Bld 130 (*)    Calcium 7.8 (*)    Total Protein 6.3 (*)    Albumin 3.3 (*)    All other components within normal limits  CBC WITH DIFFERENTIAL/PLATELET - Abnormal; Notable for the following components:   WBC 3.6 (*)    Lymphs Abs  0.6 (*)    All other components within normal limits  FIBRIN DERIVATIVES D-DIMER ([facility] ONLY) - Abnormal; Notable for the following components:   Fibrin derivatives D-dimer ([facility]) 1,223.16 (*)    All other components within normal limits  LACTIC ACID, PLASMA  BLOOD GAS, VENOUS  LACTIC ACID, PLASMA  TROPONIN I (HIGH SENSITIVITY)   ____________________________________________  EKG  EKG read interpreted by me shows sinus tachycardia rate of 119. Patient has a rightward axis irregular baseline which makes it somewhat hard to read especially in lead I and III. ____________________________________________  RADIOLOGY 02/09/20, personally viewed and evaluated these images (plain  radiographs) as part of my medical decision making, as well as reviewing the written report by the radiologist.  ED MD interpretation: Chest x-ray read by radiology reviewed by me does show mild bilateral multifocal infiltrates.  Official radiology report(s): DG Chest Portable 1 View  Result Date: 02/07/2020 CLINICAL DATA:  COVID positive with shortness of breath. EXAM: PORTABLE CHEST 1 VIEW COMPARISON:  February 04, 2020 FINDINGS: Mild multifocal infiltrates are seen involving predominantly the periphery of the mid and lower lung fields, bilaterally. There is no evidence of a pleural effusion or pneumothorax. The heart size and mediastinal contours are within normal limits. A radiopaque loop recorder device is seen. The visualized skeletal structures are unremarkable. IMPRESSION: Mild bilateral multifocal infiltrates. Electronically Signed   By: Aram Candela M.D.   On: 02/07/2020 20:06    ____________________________________________   PROCEDURES  Procedure(s) performed (including Critical Care):  Procedures   ____________________________________________   INITIAL IMPRESSION / ASSESSMENT AND PLAN / ED COURSE  I have given the patient a little bit of morphine for her pain. Her D-dimer is much  much higher than it was just 3 days ago. I will CT her to make sure there is no pulmonary embolus as Covid does predispose to blood clotting and her D-dimer has risen a great deal. ----------------------------------------- 9:15 PM on 02/07/2020 -----------------------------------------  Patient is still waiting for her CT scan to rule out PE.  I am going to sign her out to oncoming physician.  She still complaining of pain in spite of the Motrin.  Patient has not been hypoxic.  Anticipate discharge if her CT is negative.             ____________________________________________   FINAL CLINICAL IMPRESSION(S) / ED DIAGNOSES  Final diagnoses:  Dyspnea, unspecified type  Positive D-dimer  Tachypnea  Family history of ventricular tachycardia  COVID     ED Discharge Orders    None      *Please note:  Alicia [name] Note Martone was evaluated in Emergency Department on 02/07/2020 for the symptoms described in the history of present illness. She was evaluated in the context of the global COVID-19 pandemic, which necessitated consideration that the patient might be at risk for infection with the SARS-CoV-2 virus that causes COVID-19. Institutional protocols and algorithms that pertain to the evaluation of patients at risk for COVID-19 are in a state of rapid change based on information released by regulatory bodies including the CDC and federal and state organizations. These policies and algorithms were followed during the patient's care in the ED.  Some ED evaluations and interventions may be delayed as a result of limited staffing during and the pandemic.*   Note:  This document was prepared using Dragon voice recognition software and may include unintentional dictation errors.    Arnaldo Natal, MD 02/07/20 2117

## 2020-02-07 NOTE — Progress Notes (Signed)
  Diagnosis: COVID-19  Physician: Dr. Wright   Procedure: Covid Infusion Clinic Med: bamlanivimab\etesevimab infusion - Provided patient with bamlanimivab\etesevimab fact sheet for patients, parents and caregivers prior to infusion.  Complications: No immediate complications noted.  Discharge: Discharged home   Briany Aye  B Cinzia Devos 02/07/2020   

## 2020-02-07 NOTE — ED Notes (Signed)
In to draw pt labs, pt has [place] on forehead, refusing to put back in nose. Sats 86%. States "I can't breath", informed to put O2 back in nostrils and deep breath, refuses.

## 2020-02-07 NOTE — ED Provider Notes (Signed)
CT Angio Chest PE W and/or Wo Contrast  Result Date: 02/07/2020 CLINICAL DATA:  COVID-19 positive 2 days ago, short of breath after antibody infusion, lower abdominal pain, positive D-dimer EXAM: CT ANGIOGRAPHY CHEST CT ABDOMEN AND PELVIS WITH CONTRAST TECHNIQUE: Multidetector CT imaging of the chest was performed using the standard protocol during bolus administration of intravenous contrast. Multiplanar CT image reconstructions and MIPs were obtained to evaluate the vascular anatomy. Multidetector CT imaging of the abdomen and pelvis was performed using the standard protocol during bolus administration of intravenous contrast. CONTRAST:  49mL OMNIPAQUE IOHEXOL 350 MG/ML SOLN COMPARISON:  02/07/2020, 05/05/2019 FINDINGS: CTA CHEST FINDINGS Cardiovascular: There is suboptimal contrast enhancement of the pulmonary vasculature, resulting in a nondiagnostic evaluation for pulmonary embolus. The majority of the contrast is within the systemic arterial system, and the thoracic aorta is normal in appearance. Heart is unremarkable without pericardial effusion. Mediastinum/Nodes: No enlarged mediastinal, hilar, or axillary lymph nodes. Thyroid gland, trachea, and esophagus demonstrate no significant findings. Lungs/Pleura: Multifocal bilateral ground-glass airspace disease consistent with COVID 19 pneumonia. No effusion or pneumothorax. The central airways are patent. Musculoskeletal: No acute or destructive bony lesions. Reconstructed images demonstrate no additional findings. Review of the MIP images confirms the above findings. CT ABDOMEN and PELVIS FINDINGS Hepatobiliary: No focal liver abnormality is seen. Status post cholecystectomy. No biliary dilatation. Pancreas: Stable cysts within the uncinate process of the pancreas measuring 1.6 x 1.0 cm, previously shown to reflect a serous cystadenoma. The remainder of the pancreas is unremarkable. Spleen: Normal in size without focal abnormality. Adrenals/Urinary Tract:  The kidneys enhance normally and symmetrically. No evidence of obstructive uropathy or urinary calculus. Bladder is unremarkable. The adrenals are normal. Stomach/Bowel: No bowel obstruction or ileus. Normal appendix. No bowel wall thickening or inflammatory changes. Vascular/Lymphatic: Left-sided IVC again noted, an anatomic variant. No significant atherosclerosis. No pathologic adenopathy. Reproductive: Status post hysterectomy. No adnexal masses. Other: There is trace pelvic free fluid, nonspecific. No free intraperitoneal gas. No abdominal wall hernia. Musculoskeletal: No acute bony abnormalities. Reconstructed images demonstrate no additional findings. Review of the MIP images confirms the above findings. IMPRESSION: 1. Nondiagnostic evaluation of the pulmonary vasculature due to timing of contrast bolus. Pulmonary emboli cannot be excluded. 2. Multifocal bilateral airspace disease consistent with COVID 19 pneumonia. 3. Trace pelvic free fluid, likely physiologic. 4. Stable pancreatic cyst, previously shown to represent a serous cystadenoma. 5. Otherwise no acute intra-abdominal or intrapelvic process. Electronically Signed   By: Sharlet Salina M.D.   On: 02/07/2020 21:56   CT ABDOMEN PELVIS W CONTRAST  Result Date: 02/07/2020 CLINICAL DATA:  COVID-19 positive 2 days ago, short of breath after antibody infusion, lower abdominal pain, positive D-dimer EXAM: CT ANGIOGRAPHY CHEST CT ABDOMEN AND PELVIS WITH CONTRAST TECHNIQUE: Multidetector CT imaging of the chest was performed using the standard protocol during bolus administration of intravenous contrast. Multiplanar CT image reconstructions and MIPs were obtained to evaluate the vascular anatomy. Multidetector CT imaging of the abdomen and pelvis was performed using the standard protocol during bolus administration of intravenous contrast. CONTRAST:  64mL OMNIPAQUE IOHEXOL 350 MG/ML SOLN COMPARISON:  02/07/2020, 05/05/2019 FINDINGS: CTA CHEST FINDINGS  Cardiovascular: There is suboptimal contrast enhancement of the pulmonary vasculature, resulting in a nondiagnostic evaluation for pulmonary embolus. The majority of the contrast is within the systemic arterial system, and the thoracic aorta is normal in appearance. Heart is unremarkable without pericardial effusion. Mediastinum/Nodes: No enlarged mediastinal, hilar, or axillary lymph nodes. Thyroid gland, trachea, and esophagus demonstrate no significant  findings. Lungs/Pleura: Multifocal bilateral ground-glass airspace disease consistent with COVID 19 pneumonia. No effusion or pneumothorax. The central airways are patent. Musculoskeletal: No acute or destructive bony lesions. Reconstructed images demonstrate no additional findings. Review of the MIP images confirms the above findings. CT ABDOMEN and PELVIS FINDINGS Hepatobiliary: No focal liver abnormality is seen. Status post cholecystectomy. No biliary dilatation. Pancreas: Stable cysts within the uncinate process of the pancreas measuring 1.6 x 1.0 cm, previously shown to reflect a serous cystadenoma. The remainder of the pancreas is unremarkable. Spleen: Normal in size without focal abnormality. Adrenals/Urinary Tract: The kidneys enhance normally and symmetrically. No evidence of obstructive uropathy or urinary calculus. Bladder is unremarkable. The adrenals are normal. Stomach/Bowel: No bowel obstruction or ileus. Normal appendix. No bowel wall thickening or inflammatory changes. Vascular/Lymphatic: Left-sided IVC again noted, an anatomic variant. No significant atherosclerosis. No pathologic adenopathy. Reproductive: Status post hysterectomy. No adnexal masses. Other: There is trace pelvic free fluid, nonspecific. No free intraperitoneal gas. No abdominal wall hernia. Musculoskeletal: No acute bony abnormalities. Reconstructed images demonstrate no additional findings. Review of the MIP images confirms the above findings. IMPRESSION: 1. Nondiagnostic  evaluation of the pulmonary vasculature due to timing of contrast bolus. Pulmonary emboli cannot be excluded. 2. Multifocal bilateral airspace disease consistent with COVID 19 pneumonia. 3. Trace pelvic free fluid, likely physiologic. 4. Stable pancreatic cyst, previously shown to represent a serous cystadenoma. 5. Otherwise no acute intra-abdominal or intrapelvic process. Electronically Signed   By: Sharlet Salina M.D.   On: 02/07/2020 21:56   DG Chest Portable 1 View  Result Date: 02/07/2020 CLINICAL DATA:  COVID positive with shortness of breath. EXAM: PORTABLE CHEST 1 VIEW COMPARISON:  February 04, 2020 FINDINGS: Mild multifocal infiltrates are seen involving predominantly the periphery of the mid and lower lung fields, bilaterally. There is no evidence of a pleural effusion or pneumothorax. The heart size and mediastinal contours are within normal limits. A radiopaque loop recorder device is seen. The visualized skeletal structures are unremarkable. IMPRESSION: Mild bilateral multifocal infiltrates. Electronically Signed   By: Aram Candela M.D.   On: 02/07/2020 20:06     CT imaging reviewed concerning for multifocal airspace disease.  Also noted trace pelvic pressure free fluid.  Stable pancreatic cyst.  Pulmonary vasculature was not well seen due to contrast timing  ----------------------------------------- 11:00 PM on 02/07/2020 -----------------------------------------     Sharyn Creamer, MD 02/07/20 2333

## 2020-02-07 NOTE — Progress Notes (Signed)
I connected by phone with [name] Note Alicia [name] on 02/07/2020 at 1:27 PM to discuss the potential use of a new treatment for mild to moderate COVID-19 viral infection in non-hospitalized patients.  This patient is a 49 y.o. female that meets the FDA criteria for Emergency Use Authorization of COVID monoclonal antibody casirivimab/imdevimab, bamlanivimab/etesevimab, or sotrovimab.  Has a (+) direct SARS-CoV-2 viral test result  Has mild or moderate COVID-19   Is NOT hospitalized due to COVID-19  Is within 10 days of symptom onset  Has at least one of the high risk factor(s) for progression to severe COVID-19 and/or hospitalization as defined in EUA.  Specific high risk criteria : BMI > 25, Cardiovascular disease or hypertension and Neurodevelopmental disorder   I have spoken and communicated the following to the patient or parent/caregiver regarding COVID monoclonal antibody treatment:  1. FDA has authorized the emergency use for the treatment of mild to moderate COVID-19 in adults and pediatric patients with positive results of direct SARS-CoV-2 viral testing who are 57 years of age and older weighing at least 40 kg, and who are at high risk for progressing to severe COVID-19 and/or hospitalization.  2. The significant known and potential risks and benefits of COVID monoclonal antibody, and the extent to which such potential risks and benefits are unknown.  3. Information on available alternative treatments and the risks and benefits of those alternatives, including clinical trials.  4. Patients treated with COVID monoclonal antibody should continue to self-isolate and use infection control measures (e.g., wear mask, isolate, social distance, avoid sharing personal items, clean and disinfect "high touch" surfaces, and frequent handwashing) according to CDC guidelines.   5. The patient or parent/caregiver has the option to accept or refuse COVID monoclonal antibody treatment.  After  reviewing this information with the patient, the patient has agreed to receive one of the available covid 19 monoclonal antibodies and will be provided an appropriate fact sheet prior to infusion.   [place], [place] 02/07/2020 1:27 PM

## 2020-02-07 NOTE — Consult Note (Signed)
Remdesivir - Pharmacy Brief Note   O:  ALT: 17 CXR: "Mild bilateral multifocal infiltrates" SpO2: Hypoxic requiring supplemental oxygen   A/P:  02/04/20 rapid COVID swab (+)  Remdesivir 200 mg IVPB once followed by 100 mg IVPB daily x 4 days.   Alicia [name] 02/07/2020 11:06 PM

## 2020-02-07 NOTE — ED Triage Notes (Signed)
Pt tested positive for Covid19 Friday. Pt received an antibody infusion today and is c/o SOB. PT states pain 8/10.

## 2020-02-07 NOTE — Progress Notes (Signed)
Patient reviewed Fact Sheet for Patients, Parents, and Caregivers for Emergency Use Authorization (EUA) of bamlanivimab and etesevimab for the Treatment of Coronavirus. Patient also reviewed and is agreeable to the estimated cost of treatment. Patient is agreeable to proceed.   

## 2020-02-07 NOTE — ED Notes (Signed)
Patient transported to CT 

## 2020-02-07 NOTE — ED Notes (Addendum)
In to draw pt's VBG; pt holding her breath, looking at monitor and holding belly. States abd cramping. Provider aware. Encouraged to stop watching monitor and take deep breaths. Pt's sats 95% when not holding breath.

## 2020-02-07 NOTE — Discharge Instructions (Addendum)
Orthostatic Hypotension °Blood pressure is a measurement of how strongly, or weakly, your blood is pressing against the walls of your arteries. Orthostatic hypotension is a sudden drop in blood pressure that happens when you quickly change positions, such as when you get up from sitting or lying down. °Arteries are blood vessels that carry blood from your heart throughout your body. When blood pressure is too low, you may not get enough blood to your brain or to the rest of your organs. This can cause weakness, light-headedness, rapid heartbeat, and fainting. This can last for just a few seconds or for up to a few minutes. Orthostatic hypotension is usually not a serious problem. However, if it happens frequently or gets worse, it may be a sign of something more serious. °What are the causes? °This condition may be caused by: °· Sudden changes in posture, such as standing up quickly after you have been sitting or lying down. °· Blood loss. °· Loss of body fluids (dehydration). °· Heart problems. °· Hormone (endocrine) problems. °· Pregnancy. °· Severe infection. °· Lack of certain nutrients. °· Severe allergic reactions (anaphylaxis). °· Certain medicines, such as blood pressure medicine or medicines that make the body lose excess fluids (diuretics). Sometimes, this condition can be caused by not taking medicine as directed, such as taking too much of a certain medicine. °What increases the risk? °The following factors may make you more likely to develop this condition: °· Age. Risk increases as you get older. °· Conditions that affect the heart or the central nervous system. °· Taking certain medicines, such as blood pressure medicine or diuretics. °· Being pregnant. °What are the signs or symptoms? °Symptoms of this condition may include: °· Weakness. °· Light-headedness. °· Dizziness. °· Blurred vision. °· Fatigue. °· Rapid heartbeat. °· Fainting, in severe cases. °How is this diagnosed? °This condition is  diagnosed based on: °· Your medical history. °· Your symptoms. °· Your blood pressure measurement. Your health care provider will check your blood pressure when you are: °? Lying down. °? Sitting. °? Standing. °A blood pressure reading is recorded as two numbers, such as "120 over 80" (or 120/80). The first ("top") number is called the systolic pressure. It is a measure of the pressure in your arteries as your heart beats. The second ("bottom") number is called the diastolic pressure. It is a measure of the pressure in your arteries when your heart relaxes between beats. Blood pressure is measured in a unit called mm Hg. Healthy blood pressure for most adults is 120/80. If your blood pressure is below 90/60, you may be diagnosed with hypotension. °Other information or tests that may be used to diagnose orthostatic hypotension include: °· Your other vital signs, such as your heart rate and temperature. °· Blood tests. °· Tilt table test. For this test, you will be safely secured to a table that moves you from a lying position to an upright position. Your heart rhythm and blood pressure will be monitored during the test. °How is this treated? °This condition may be treated by: °· Changing your diet. This may involve eating more salt (sodium) or drinking more water. °· Taking medicines to raise your blood pressure. °· Changing the dosage of certain medicines you are taking that might be lowering your blood pressure. °· Wearing compression stockings. These stockings help to prevent blood clots and reduce swelling in your legs. °In some cases, you may need to go to the hospital for: °· Fluid replacement. This means you will   receive fluids through an IV.  Blood replacement. This means you will receive donated blood through an IV (transfusion).  Treating an infection or heart problems, if this applies.  Monitoring. You may need to be monitored while medicines that you are taking wear off. Follow these instructions  at home: Eating and drinking   Drink enough fluid to keep your urine pale yellow.  Eat a healthy diet, and follow instructions from your health care provider about eating or drinking restrictions. A healthy diet includes: ? Fresh fruits and vegetables. ? Whole grains. ? Lean meats. ? Low-fat dairy products.  Eat extra salt only as directed. Do not add extra salt to your diet unless your health care provider told you to do that.  Eat frequent, small meals.  Avoid standing up suddenly after eating. Medicines  Take over-the-counter and prescription medicines only as told by your health care provider. ? Follow instructions from your health care provider about changing the dosage of your current medicines, if this applies. ? Do not stop or adjust any of your medicines on your own. General instructions   Wear compression stockings as told by your health care provider.  Get up slowly from lying down or sitting positions. This gives your blood pressure a chance to adjust.  Avoid hot showers and excessive heat as directed by your health care provider.  Return to your normal activities as told by your health care provider. Ask your health care provider what activities are safe for you.  Do not use any products that contain nicotine or tobacco, such as cigarettes, e-cigarettes, and chewing tobacco. If you need help quitting, ask your health care provider.  Keep all follow-up visits as told by your health care provider. This is important. Contact a health care provider if you:  Vomit.  Have diarrhea.  Have a fever for more than 2-3 days.  Feel more thirsty than usual.  Feel weak and tired. Get help right away if you:  Have chest pain.  Have a fast or irregular heartbeat.  Develop numbness in any part of your body.  Cannot move your arms or your legs.  Have trouble speaking.  Become sweaty or feel light-headed.  Faint.  Feel short of breath.  Have trouble staying  awake.  Feel confused. Summary  Orthostatic hypotension is a sudden drop in blood pressure that happens when you quickly change positions.  Orthostatic hypotension is usually not a serious problem.  It is diagnosed by having your blood pressure taken lying down, sitting, and then standing.  It may be treated by changing your diet or adjusting your medicines. This information is not intended to replace advice given to you by your health care provider. Make sure you discuss any questions you have with your health care provider. Document Revised: 07/31/2017 Document Reviewed: 07/31/2017 Elsevier Patient Education  2020 Elsevier Inc.  10 Things You Can Do to Manage Your COVID-19 Symptoms at Home If you have possible or confirmed COVID-19: 1. Stay home from work and school. And stay away from other public places. If you must go out, avoid using any kind of public transportation, ridesharing, or taxis. 2. Monitor your symptoms carefully. If your symptoms get worse, call your healthcare provider immediately. 3. Get rest and stay hydrated. 4. If you have a medical appointment, call the healthcare provider ahead of time and tell them that you have or may have COVID-19. 5. For medical emergencies, call 911 and notify the dispatch personnel that you have or may  have COVID-19. 6. Cover your cough and sneezes with a tissue or use the inside of your elbow. 7. Wash your hands often with soap and water for at least 20 seconds or clean your hands with an alcohol-based hand sanitizer that contains at least 60% alcohol. 8. As much as possible, stay in a specific room and away from other people in your home. Also, you should use a separate bathroom, if available. If you need to be around other people in or outside of the home, wear a mask. 9. Avoid sharing personal items with other people in your household, like dishes, towels, and bedding. 10. Clean all surfaces that are touched often, like counters,  tabletops, and doorknobs. Use household cleaning sprays or wipes according to the label instructions. [email or web address] 08/19/2018 This information is not intended to replace advice given to you by your health care provider. Make sure you discuss any questions you have with your health care provider. Document Revised: 01/21/2019 Document Reviewed: 01/21/2019 Elsevier Patient Education  2020 Elsevier Inc.   COVID-19 COVID-19 is a respiratory infection that is caused by a virus called severe acute respiratory syndrome coronavirus 2 (SARS-CoV-2). The disease is also known as coronavirus disease or novel coronavirus. In some people, the virus may not cause any symptoms. In others, it may cause a serious infection. The infection can get worse quickly and can lead to complications, such as:  Pneumonia, or infection of the lungs.  Acute respiratory distress syndrome or ARDS. This is a condition in which fluid build-up in the lungs prevents the lungs from filling with air and passing oxygen into the blood.  Acute respiratory failure. This is a condition in which there is not enough oxygen passing from the lungs to the body or when carbon dioxide is not passing from the lungs out of the body.  Sepsis or septic shock. This is a serious bodily reaction to an infection.  Blood clotting problems.  Secondary infections due to bacteria or fungus.  Organ failure. This is when your body's organs stop working. The virus that causes COVID-19 is contagious. This means that it can spread from person to person through droplets from coughs and sneezes (respiratory secretions). What are the causes? This illness is caused by a virus. You may catch the virus by:  Breathing in droplets from an infected person. Droplets can be spread by a person breathing, speaking, singing, coughing, or sneezing.  Touching something, like a table or a doorknob, that was exposed to the virus (contaminated) and then touching  your mouth, nose, or eyes. What increases the risk? Risk for infection You are more likely to be infected with this virus if you:  Are within 6 feet (2 meters) of a person with COVID-19.  Provide care for or live with a person who is infected with COVID-19.  Spend time in crowded indoor spaces or live in shared housing. Risk for serious illness You are more likely to become seriously ill from the virus if you:  Are 47 years of age or older. The higher your age, the more you are at risk for serious illness.  Live in a nursing home or long-term care facility.  Have cancer.  Have a long-term (chronic) disease such as: ? Chronic lung disease, including chronic obstructive pulmonary disease or asthma. ? A long-term disease that lowers your body's ability to fight infection (immunocompromised). ? Heart disease, including heart failure, a condition in which the arteries that lead to the heart become narrow or blocked (  coronary artery disease), a disease which makes the heart muscle thick, weak, or stiff (cardiomyopathy). ? Diabetes. ? Chronic kidney disease. ? Sickle cell disease, a condition in which red blood cells have an abnormal "sickle" shape. ? Liver disease.  Are obese. What are the signs or symptoms? Symptoms of this condition can range from mild to severe. Symptoms may appear any time from 2 to 14 days after being exposed to the virus. They include:  A fever or chills.  A cough.  Difficulty breathing.  Headaches, body aches, or muscle aches.  Runny or stuffy (congested) nose.  A sore throat.  New loss of taste or smell. Some people may also have stomach problems, such as nausea, vomiting, or diarrhea. Other people may not have any symptoms of COVID-19. How is this diagnosed? This condition may be diagnosed based on:  Your signs and symptoms, especially if: ? You live in an area with a COVID-19 outbreak. ? You recently traveled to or from an area where the virus  is common. ? You provide care for or live with a person who was diagnosed with COVID-19. ? You were exposed to a person who was diagnosed with COVID-19.  A physical exam.  Lab tests, which may include: ? Taking a sample of fluid from the back of your nose and throat (nasopharyngeal fluid), your nose, or your throat using a swab. ? A sample of mucus from your lungs (sputum). ? Blood tests.  Imaging tests, which may include, X-rays, CT scan, or ultrasound. How is this treated? At present, there is no medicine to treat COVID-19. Medicines that treat other diseases are being used on a trial basis to see if they are effective against COVID-19. Your health care provider will talk with you about ways to treat your symptoms. For most people, the infection is mild and can be managed at home with rest, fluids, and over-the-counter medicines. Treatment for a serious infection usually takes places in a hospital intensive care unit (ICU). It may include one or more of the following treatments. These treatments are given until your symptoms improve.  Receiving fluids and medicines through an IV.  Supplemental oxygen. Extra oxygen is given through a tube in the nose, a face mask, or a hood.  Positioning you to lie on your stomach (prone position). This makes it easier for oxygen to get into the lungs.  Continuous positive airway pressure (CPAP) or bi-level positive airway pressure (BPAP) machine. This treatment uses mild air pressure to keep the airways open. A tube that is connected to a motor delivers oxygen to the body.  Ventilator. This treatment moves air into and out of the lungs by using a tube that is placed in your windpipe.  Tracheostomy. This is a procedure to create a hole in the neck so that a breathing tube can be inserted.  Extracorporeal membrane oxygenation (ECMO). This procedure gives the lungs a chance to recover by taking over the functions of the heart and lungs. It supplies oxygen  to the body and removes carbon dioxide. Follow these instructions at home: Lifestyle  If you are sick, stay home except to get medical care. Your health care provider will tell you how long to stay home. Call your health care provider before you go for medical care.  Rest at home as told by your health care provider.  Do not use any products that contain nicotine or tobacco, such as cigarettes, e-cigarettes, and chewing tobacco. If you need help quitting, ask your  health care provider.  Return to your normal activities as told by your health care provider. Ask your health care provider what activities are safe for you. General instructions  Take over-the-counter and prescription medicines only as told by your health care provider.  Drink enough fluid to keep your urine pale yellow.  Keep all follow-up visits as told by your health care provider. This is important. How is this prevented?  There is no vaccine to help prevent COVID-19 infection. However, there are steps you can take to protect yourself and others from this virus. To protect yourself:   Do not travel to areas where COVID-19 is a risk. The areas where COVID-19 is reported change often. To identify high-risk areas and travel restrictions, check the CDC travel website: StageSync.si  If you live in, or must travel to, an area where COVID-19 is a risk, take precautions to avoid infection. ? Stay away from people who are sick. ? Wash your hands often with soap and water for 20 seconds. If soap and water are not available, use an alcohol-based hand sanitizer. ? Avoid touching your mouth, face, eyes, or nose. ? Avoid going out in public, follow guidance from your state and local health authorities. ? If you must go out in public, wear a cloth face covering or face mask. Make sure your mask covers your nose and mouth. ? Avoid crowded indoor spaces. Stay at least 6 feet (2 meters) away from others. ? Disinfect  objects and surfaces that are frequently touched every day. This may include:  Counters and tables.  Doorknobs and light switches.  Sinks and faucets.  Electronics, such as phones, remote controls, keyboards, computers, and tablets. To protect others: If you have symptoms of COVID-19, take steps to prevent the virus from spreading to others.  If you think you have a COVID-19 infection, contact your health care provider right away. Tell your health care team that you think you may have a COVID-19 infection.  Stay home. Leave your house only to seek medical care. Do not use public transport.  Do not travel while you are sick.  Wash your hands often with soap and water for 20 seconds. If soap and water are not available, use alcohol-based hand sanitizer.  Stay away from other members of your household. Let healthy household members care for children and pets, if possible. If you have to care for children or pets, wash your hands often and wear a mask. If possible, stay in your own room, separate from others. Use a different bathroom.  Make sure that all people in your household wash their hands well and often.  Cough or sneeze into a tissue or your sleeve or elbow. Do not cough or sneeze into your hand or into the air.  Wear a cloth face covering or face mask. Make sure your mask covers your nose and mouth. Where to find more information  Centers for Disease Control and Prevention: StickerEmporium.tn  World Health Organization: https://thompson-craig.com/ Contact a health care provider if:  You live in or have traveled to an area where COVID-19 is a risk and you have symptoms of the infection.  You have had contact with someone who has COVID-19 and you have symptoms of the infection. Get help right away if:  You have trouble breathing.  You have pain or pressure in your chest.  You have confusion.  You have bluish lips and  fingernails.  You have difficulty waking from sleep.  You have symptoms that get  worse. These symptoms may represent a serious problem that is an emergency. Do not wait to see if the symptoms will go away. Get medical help right away. Call your local emergency services (911 in the [place] Do not drive yourself to the hospital. Let the emergency medical personnel know if you think you have COVID-19. Summary  COVID-19 is a respiratory infection that is caused by a virus. It is also known as coronavirus disease or novel coronavirus. It can cause serious infections, such as pneumonia, acute respiratory distress syndrome, acute respiratory failure, or sepsis.  The virus that causes COVID-19 is contagious. This means that it can spread from person to person through droplets from breathing, speaking, singing, coughing, or sneezing.  You are more likely to develop a serious illness if you are 16 years of age or older, have a weak immune system, live in a nursing home, or have chronic disease.  There is no medicine to treat COVID-19. Your health care provider will talk with you about ways to treat your symptoms.  Take steps to protect yourself and others from infection. Wash your hands often and disinfect objects and surfaces that are frequently touched every day. Stay away from people who are sick and wear a mask if you are sick. This information is not intended to replace advice given to you by your health care provider. Make sure you discuss any questions you have with your health care provider. Document Revised: 12/04/2018 Document Reviewed: 03/12/2018 Elsevier Patient Education  2020 Elsevier Inc.  COVID-19: How to Protect Yourself and Others Know how it spreads  There is currently no vaccine to prevent coronavirus disease 2019 (COVID-19).  The best way to prevent illness is to avoid being exposed to this virus.  The virus is thought to spread mainly from person-to-person. ? Between people  who are in close contact with one another (within about 6 feet). ? Through respiratory droplets produced when an infected person coughs, sneezes or talks. ? These droplets can land in the mouths or noses of people who are nearby or possibly be inhaled into the lungs. ? COVID-19 may be spread by people who are not showing symptoms. Everyone should Clean your hands often  Wash your hands often with soap and water for at least 20 seconds especially after you have been in a public place, or after blowing your nose, coughing, or sneezing.  If soap and water are not readily available, use a hand sanitizer that contains at least 60% alcohol. Cover all surfaces of your hands and rub them together until they feel dry.  Avoid touching your eyes, nose, and mouth with unwashed hands. Avoid close contact  Limit contact with others as much as possible.  Avoid close contact with people who are sick.  Put distance between yourself and other people. ? Remember that some people without symptoms may be able to spread virus. ? This is especially important for people who are at higher risk of getting very RetroStamps.it Cover your mouth and nose with a mask when around others  You could spread COVID-19 to others even if you do not feel sick.  Everyone should wear a mask in public settings and when around people not living in their household, especially when social distancing is difficult to maintain. ? Masks should not be placed on young children under age 14, anyone who has trouble breathing, or is unconscious, incapacitated or otherwise unable to remove the mask without assistance.  The mask is meant to protect other  people in case you are infected.  Do NOT use a facemask meant for a Research scientist (physical sciences).  Continue to keep about 6 feet between yourself and others. The mask is not a substitute for social distancing. Cover coughs and  sneezes  Always cover your mouth and nose with a tissue when you cough or sneeze or use the inside of your elbow.  Throw used tissues in the trash.  Immediately wash your hands with soap and water for at least 20 seconds. If soap and water are not readily available, clean your hands with a hand sanitizer that contains at least 60% alcohol. Clean and disinfect  Clean AND disinfect frequently touched surfaces daily. This includes tables, doorknobs, light switches, countertops, handles, desks, phones, keyboards, toilets, faucets, and sinks. ktimeonline.com  If surfaces are dirty, clean them: Use detergent or soap and water prior to disinfection.  Then, use a household disinfectant. You can see a list of EPA-registered household disinfectants here. [employer]Flowers[employer] 10/21/2018 This information is not intended to replace advice given to you by your health care provider. Make sure you discuss any questions you have with your health care provider. Document Revised: 10/29/2018 Document Reviewed: 08/27/2018 Elsevier Patient Education  2020 [employer].   COVID-19 Frequently Asked Questions COVID-19 (coronavirus disease) is an infection that is caused by a large family of viruses. Some viruses cause illness in people and others cause illness in animals like camels, cats, and bats. In some cases, the viruses that cause illness in animals can spread to humans. Where did the coronavirus come from? In December 2019, [place] told the [employer] [facility]) of several cases of lung disease (human respiratory illness). These cases were linked to an open seafood and livestock market in the city of [place]. The link to the seafood and livestock market suggests that the virus may have spread from animals to humans. However, since that first outbreak in December, the virus has also been shown to spread from person to person. What is the  name of the disease and the virus? Disease name Early on, this disease was called novel coronavirus. This is because scientists determined that the disease was caused by a new (novel) respiratory virus. The World Health Organization [facility]) has now named the disease COVID-19, or coronavirus disease. Virus name The virus that causes the disease is called severe acute respiratory syndrome coronavirus 2 (SARS-CoV-2). More information on disease and virus naming World Health Organization [facility]): www.who.int/emergencies/diseases/novel-coronavirus-2019/technical-guidance/naming-the-coronavirus-disease-(covid-2019)-and-the-virus-that-causes-it Who is at risk for complications from coronavirus disease? Some people may be at higher risk for complications from coronavirus disease. This includes older adults and people who have chronic diseases, such as heart disease, diabetes, and lung disease. If you are at higher risk for complications, take these extra precautions:  Stay home as much as possible.  Avoid social gatherings and travel.  Avoid close contact with others. Stay at least 6 ft (2 m) away from others, if possible.  Wash your hands often with soap and water for at least 20 seconds.  Avoid touching your face, mouth, nose, or eyes.  Keep supplies on hand at home, such as food, medicine, and cleaning supplies.  If you must go out in public, wear a cloth face covering or face mask. Make sure your mask covers your nose and mouth. How does coronavirus disease spread? The virus that causes coronavirus disease spreads easily from person to person (is contagious). You may catch the virus by:  Breathing in droplets from an infected person. Droplets can be  spread by a person breathing, speaking, singing, coughing, or sneezing.  Touching something, like a table or a doorknob, that was exposed to the virus (contaminated) and then touching your mouth, nose, or eyes. Can I get the virus from touching  surfaces or objects? There is still a lot that we do not know about the virus that causes coronavirus disease. Scientists are basing a lot of information on what they know about similar viruses, such as:  Viruses cannot generally survive on surfaces for long. They need a human body (host) to survive.  It is more likely that the virus is spread by close contact with people who are sick (direct contact), such as through: ? Shaking hands or hugging. ? Breathing in respiratory droplets that travel through the air. Droplets can be spread by a person breathing, speaking, singing, coughing, or sneezing.  It is less likely that the virus is spread when a person touches a surface or object that has the virus on it (indirect contact). The virus may be able to enter the body if the person touches a surface or object and then touches his or her face, eyes, nose, or mouth. Can a person spread the virus without having symptoms of the disease? It may be possible for the virus to spread before a person has symptoms of the disease, but this is most likely not the main way the virus is spreading. It is more likely for the virus to spread by being in close contact with people who are sick and breathing in the respiratory droplets spread by a person breathing, speaking, singing, coughing, or sneezing. What are the symptoms of coronavirus disease? Symptoms vary from person to person and can range from mild to severe. Symptoms may include:  Fever or chills.  Cough.  Difficulty breathing or feeling short of breath.  Headaches, body aches, or muscle aches.  Runny or stuffy (congested) nose.  Sore throat.  New loss of taste or smell.  Nausea, vomiting, or diarrhea. These symptoms can appear anywhere from 2 to 14 days after you have been exposed to the virus. Some people may not have any symptoms. If you develop symptoms, call your health care provider. People with severe symptoms may need hospital care. Should  I be tested for this virus? Your health care provider will decide whether to test you based on your symptoms, history of exposure, and your risk factors. How does a health care provider test for this virus? Health care providers will collect samples to send for testing. Samples may include:  Taking a swab of fluid from the back of your nose and throat, your nose, or your throat.  Taking fluid from the lungs by having you cough up mucus (sputum) into a sterile cup.  Taking a blood sample. Is there a treatment or vaccine for this virus? Currently, there is no vaccine to prevent coronavirus disease. Also, there are no medicines like antibiotics or antivirals to treat the virus. A person who becomes sick is given supportive care, which means rest and fluids. A person may also relieve his or her symptoms by using over-the-counter medicines that treat sneezing, coughing, and runny nose. These are the same medicines that a person takes for the common cold. If you develop symptoms, call your health care provider. People with severe symptoms may need hospital care. What can I do to protect myself and my family from this virus?     You can protect yourself and your family by taking the same  actions that you would take to prevent the spread of other viruses. Take the following actions:  Wash your hands often with soap and water for at least 20 seconds. If soap and water are not available, use alcohol-based hand sanitizer.  Avoid touching your face, mouth, nose, or eyes.  Cough or sneeze into a tissue, sleeve, or elbow. Do not cough or sneeze into your hand or the air. ? If you cough or sneeze into a tissue, throw it away immediately and wash your hands.  Disinfect objects and surfaces that you frequently touch every day.  Stay away from people who are sick.  Avoid going out in public, follow guidance from your state and local health authorities.  Avoid crowded indoor spaces. Stay at least 6 ft  (2 m) away from others.  If you must go out in public, wear a cloth face covering or face mask. Make sure your mask covers your nose and mouth.  Stay home if you are sick, except to get medical care. Call your health care provider before you get medical care. Your health care provider will tell you how long to stay home.  Make sure your vaccines are up to date. Ask your health care provider what vaccines you need. What should I do if I need to travel? Follow travel recommendations from your local health authority, the CDC, and WHO. Travel information and advice  Centers for Disease Control and Prevention (CDC): GeminiCard.gl  World Health Organization [facility]): PreviewDomains.se Know the risks and take action to protect your health  You are at higher risk of getting coronavirus disease if you are traveling to areas with an outbreak or if you are exposed to travelers from areas with an outbreak.  Wash your hands often and practice good hygiene to lower the risk of catching or spreading the virus. What should I do if I am sick? General instructions to stop the spread of infection  Wash your hands often with soap and water for at least 20 seconds. If soap and water are not available, use alcohol-based hand sanitizer.  Cough or sneeze into a tissue, sleeve, or elbow. Do not cough or sneeze into your hand or the air.  If you cough or sneeze into a tissue, throw it away immediately and wash your hands.  Stay home unless you must get medical care. Call your health care provider or local health authority before you get medical care.  Avoid public areas. Do not take public transportation, if possible.  If you can, wear a mask if you must go out of the house or if you are in close contact with someone who is not sick. Make sure your mask covers your nose and mouth. Keep your home clean  Disinfect  objects and surfaces that are frequently touched every day. This may include: ? Counters and tables. ? Doorknobs and light switches. ? Sinks and faucets. ? Electronics such as phones, remote controls, keyboards, computers, and tablets.  Wash dishes in hot, soapy water or use a dishwasher. Air-dry your dishes.  Wash laundry in hot water. Prevent infecting other household members  Let healthy household members care for children and pets, if possible. If you have to care for children or pets, wash your hands often and wear a mask.  Sleep in a different bedroom or bed, if possible.  Do not share personal items, such as razors, toothbrushes, deodorant, combs, brushes, towels, and washcloths. Where to find more information Centers for Disease Control and Prevention (CDC)  Information and news updates: CardRetirement.cz World Health Organization [employer])  Information and news updates: AffordableSalon.es  Coronavirus health topic: https://thompson-craig.com/  Questions and answers on COVID-19: kruiseway.com  Global tracker: who.sprinklr.com American Academy of Pediatrics (AAP)  Information for families: www.healthychildren.org/English/health-issues/conditions/chest-lungs/Pages/2019-Novel-Coronavirus.aspx The coronavirus situation is changing rapidly. Check your local health authority website or the CDC and [facility] websites for updates and news. When should I contact a health care provider?  Contact your health care provider if you have symptoms of an infection, such as fever or cough, and you: ? Have been near anyone who is known to have coronavirus disease. ? Have come into contact with a person who is suspected to have coronavirus disease. ? Have traveled to an area where there is an outbreak of COVID-19. When should I get emergency medical care?  Get help right away by calling your local  emergency services (911 in the [place] if you have: ? Trouble breathing. ? Pain or pressure in your chest. ? Confusion. ? Blue-tinged lips and fingernails. ? Difficulty waking from sleep. ? Symptoms that get worse. Let the emergency medical personnel know if you think you have coronavirus disease. Summary  A new respiratory virus is spreading from person to person and causing COVID-19 (coronavirus disease).  The virus that causes COVID-19 appears to spread easily. It spreads from one person to another through droplets from breathing, speaking, singing, coughing, or sneezing.  Older adults and those with chronic diseases are at higher risk of disease. If you are at higher risk for complications, take extra precautions.  There is currently no vaccine to prevent coronavirus disease. There are no medicines, such as antibiotics or antivirals, to treat the virus.  You can protect yourself and your family by washing your hands often, avoiding touching your face, and covering your coughs and sneezes. This information is not intended to replace advice given to you by your health care provider. Make sure you discuss any questions you have with your health care provider. Document Revised: 12/04/2018 Document Reviewed: 06/02/2018 Elsevier Patient Education  2020 Elsevier Inc.    Person Under Monitoring Name: Alicia [name] Note Clabaugh  Location: [address] [place] [address]   Infection Prevention Recommendations for Individuals Confirmed to have, or Being Evaluated for, 2019 Novel Coronavirus (COVID-19) Infection Who Receive Care at Home  Individuals who are confirmed to have, or are being evaluated for, COVID-19 should follow the prevention steps below until a healthcare provider or local or state health department says they can return to normal activities.  Stay home except to get medical care You should restrict activities outside your home, except for getting medical care. Do not  go to work, school, or public areas, and do not use public transportation or taxis.  Call ahead before visiting your doctor Before your medical appointment, call the healthcare provider and tell them that you have, or are being evaluated for, COVID-19 infection. This will help the healthcare providers office take steps to keep other people from getting infected. Ask your healthcare provider to call the local or state health department.  Monitor your symptoms Seek prompt medical attention if your illness is worsening (e.g., difficulty breathing). Before going to your medical appointment, call the healthcare provider and tell them that you have, or are being evaluated for, COVID-19 infection. Ask your healthcare provider to call the local or state health department.  Wear a facemask You should wear a facemask that covers your nose and mouth when you are in the same room with  other people and when you visit a healthcare provider. People who live with or visit you should also wear a facemask while they are in the same room with you.  Separate yourself from other people in your home As much as possible, you should stay in a different room from other people in your home. Also, you should use a separate bathroom, if available.  Avoid sharing household items You should not share dishes, drinking glasses, cups, eating utensils, towels, bedding, or other items with other people in your home. After using these items, you should wash them thoroughly with soap and water.  Cover your coughs and sneezes Cover your mouth and nose with a tissue when you cough or sneeze, or you can cough or sneeze into your sleeve. Throw used tissues in a lined trash can, and immediately wash your hands with soap and water for at least 20 seconds or use an alcohol-based hand rub.  Wash your [employer] your hands often and thoroughly with soap and water for at least 20 seconds. You can use an alcohol-based  hand sanitizer if soap and water are not available and if your hands are not visibly dirty. Avoid touching your eyes, nose, and mouth with unwashed hands.   Prevention Steps for Caregivers and Household Members of Individuals Confirmed to have, or Being Evaluated for, COVID-19 Infection Being Cared for in the Home  If you live with, or provide care at home for, a person confirmed to have, or being evaluated for, COVID-19 infection please follow these guidelines to prevent infection:  Follow healthcare providers instructions Make sure that you understand and can help the patient follow any healthcare provider instructions for all care.  Provide for the patients basic needs You should help the patient with basic needs in the home and provide support for getting groceries, prescriptions, and other personal needs.  Monitor the patients symptoms If they are getting sicker, call his or her medical provider and tell them that the patient has, or is being evaluated for, COVID-19 infection. This will help the healthcare providers office take steps to keep other people from getting infected. Ask the healthcare provider to call the local or state health department.  Limit the number of people who have contact with the patient  If possible, have only one caregiver for the patient.  Other household members should stay in another home or place of residence. If this is not possible, they should stay  in another room, or be separated from the patient as much as possible. Use a separate bathroom, if available.  Restrict visitors who do not have an essential need to be in the home.  Keep older adults, very young children, and other sick people away from the patient Keep older adults, very young children, and those who have compromised immune systems or chronic health conditions away from the patient. This includes people with chronic heart, lung, or kidney conditions, diabetes, and  cancer.  Ensure good ventilation Make sure that shared spaces in the home have good air flow, such as from an air conditioner or an opened window, weather permitting.  Wash your hands often  Wash your hands often and thoroughly with soap and water for at least 20 seconds. You can use an alcohol based hand sanitizer if soap and water are not available and if your hands are not visibly dirty.  Avoid touching your eyes, nose, and mouth with unwashed hands.  Use disposable paper towels to dry your hands. If not available,  use dedicated cloth towels and replace them when they become wet.  Wear a facemask and gloves  Wear a disposable facemask at all times in the room and gloves when you touch or have contact with the patients blood, body fluids, and/or secretions or excretions, such as sweat, saliva, sputum, nasal mucus, vomit, urine, or feces.  Ensure the mask fits over your nose and mouth tightly, and do not touch it during use.  Throw out disposable facemasks and gloves after using them. Do not reuse.  Wash your hands immediately after removing your facemask and gloves.  If your personal clothing becomes contaminated, carefully remove clothing and launder. Wash your hands after handling contaminated clothing.  Place all used disposable facemasks, gloves, and other waste in a lined container before disposing them with other household waste.  Remove gloves and wash your hands immediately after handling these items.  Do not share dishes, glasses, or other household items with the patient  Avoid sharing household items. You should not share dishes, drinking glasses, cups, eating utensils, towels, bedding, or other items with a patient who is confirmed to have, or being evaluated for, COVID-19 infection.  After the person uses these items, you should wash them thoroughly with soap and water.  Wash laundry thoroughly  Immediately remove and wash clothes or bedding that have blood, body  fluids, and/or secretions or excretions, such as sweat, saliva, sputum, nasal mucus, vomit, urine, or feces, on them.  Wear gloves when handling laundry from the patient.  Read and follow directions on labels of laundry or clothing items and detergent. In general, wash and dry with the warmest temperatures recommended on the label.  Clean all areas the individual has used often  Clean all touchable surfaces, such as counters, tabletops, doorknobs, bathroom fixtures, toilets, phones, keyboards, tablets, and bedside tables, every day. Also, clean any surfaces that may have blood, body fluids, and/or secretions or excretions on them.  Wear gloves when cleaning surfaces the patient has come in contact with.  Use a diluted bleach solution (e.g., dilute bleach with 1 part bleach and 10 parts water) or a household disinfectant with a label that says EPA-registered for coronaviruses. To make a bleach solution at home, add 1 tablespoon of bleach to 1 quart (4 cups) of water. For a larger supply, add  cup of bleach to 1 gallon (16 cups) of water.  Read labels of cleaning products and follow recommendations provided on product labels. Labels contain instructions for safe and effective use of the cleaning product including precautions you should take when applying the product, such as wearing gloves or eye protection and making sure you have good ventilation during use of the product.  Remove gloves and wash hands immediately after cleaning.  Monitor yourself for signs and symptoms of illness Caregivers and household members are considered close contacts, should monitor their health, and will be asked to limit movement outside of the home to the extent possible. Follow the monitoring steps for close contacts listed on the symptom monitoring form.   ? If you have additional questions, contact your local health department or call the epidemiologist on call at [phone] (available 24/7). ? This  guidance is subject to change. For the most up-to-date guidance from [facility], please refer to their website: TripMetro.hu COVID-19: Quarantine vs. Isolation QUARANTINE keeps someone who was in close contact with someone who has COVID-19 away from others. If you had close contact with a person who has COVID-19  Stay home until 14 days  after your last contact.  Check your temperature twice a day and watch for symptoms of COVID-19.  If possible, stay away from people who are at higher-risk for getting very sick from COVID-19. ISOLATION keeps someone who is sick or tested positive for COVID-19 without symptoms away from others, even in their own home. If you are sick and think or know you have COVID-19  Stay home until after ? At least 10 days since symptoms first appeared and ? At least 24 hours with no fever without fever-reducing medication and ? Symptoms have improved If you tested positive for COVID-19 but do not have symptoms  Stay home until after ? 10 days have passed since your positive test If you live with others, stay in a specific "sick room" or area and away from other people or animals, including pets. Use a separate bathroom, if available. SouthAmericaFlowers.co.uk 09/07/2018 This information is not intended to replace advice given to you by your health care provider. Make sure you discuss any questions you have with your health care provider. Document Revised: 01/21/2019 Document Reviewed: 01/21/2019 Elsevier Patient Education  2020 [employer]. Please use Tylenol or Motrin as needed for the pain.  Please return if you feel more shortness of breath or get a higher fever or feel worse.

## 2020-02-07 NOTE — Progress Notes (Signed)
PHARMACIST - PHYSICIAN COMMUNICATION  CONCERNING:  Enoxaparin (Lovenox) for DVT Prophylaxis    RECOMMENDATION: Patient was prescribed enoxaparin 40mg  q24 hours for VTE prophylaxis.   Filed Weights   02/07/20 1939  Weight: 90.7 kg (200 lb)    Body mass index is 30.41 kg/m.  Estimated Creatinine Clearance: 86.2 mL/min (by C-G formula based on SCr of 0.93 mg/dL).   Based on [facility] policy patient is candidate for enoxaparin 0.5mg /kg TBW SQ every 24 hours based on BMI being >30.  DESCRIPTION: Pharmacy has adjusted enoxaparin dose per [facility] policy.  Patient is now receiving enoxaparin 45 mg every 24 hours    [facility] 02/07/2020 11:48 PM

## 2020-02-08 ENCOUNTER — Inpatient Hospital Stay: Payer: BC Managed Care – PPO

## 2020-02-08 ENCOUNTER — Encounter: Payer: Self-pay | Admitting: Family Medicine

## 2020-02-08 ENCOUNTER — Other Ambulatory Visit: Payer: Self-pay

## 2020-02-08 DIAGNOSIS — U071 COVID-19: Secondary | ICD-10-CM | POA: Diagnosis not present

## 2020-02-08 LAB — CBC WITH DIFFERENTIAL/PLATELET
Abs Immature Granulocytes: 0.05 10*3/uL (ref 0.00–0.07)
Basophils Absolute: 0 10*3/uL (ref 0.0–0.1)
Basophils Relative: 0 %
Eosinophils Absolute: 0 10*3/uL (ref 0.0–0.5)
Eosinophils Relative: 0 %
HCT: 35.8 % — ABNORMAL LOW (ref 36.0–46.0)
Hemoglobin: 12.4 g/dL (ref 12.0–15.0)
Immature Granulocytes: 1 %
Lymphocytes Relative: 15 %
Lymphs Abs: 0.7 10*3/uL (ref 0.7–4.0)
MCH: 30.5 pg (ref 26.0–34.0)
MCHC: 34.6 g/dL (ref 30.0–36.0)
MCV: 88 fL (ref 80.0–100.0)
Monocytes Absolute: 0.1 10*3/uL (ref 0.1–1.0)
Monocytes Relative: 3 %
Neutro Abs: 3.7 10*3/uL (ref 1.7–7.7)
Neutrophils Relative %: 81 %
Platelets: 154 10*3/uL (ref 150–400)
RBC: 4.07 MIL/uL (ref 3.87–5.11)
RDW: 13.4 % (ref 11.5–15.5)
WBC: 4.6 10*3/uL (ref 4.0–10.5)
nRBC: 0 % (ref 0.0–0.2)

## 2020-02-08 LAB — COMPREHENSIVE METABOLIC PANEL
ALT: 49 U/L — ABNORMAL HIGH (ref 0–44)
AST: 84 U/L — ABNORMAL HIGH (ref 15–41)
Albumin: 3.1 g/dL — ABNORMAL LOW (ref 3.5–5.0)
Alkaline Phosphatase: 209 U/L — ABNORMAL HIGH (ref 38–126)
Anion gap: 9 (ref 5–15)
BUN: 10 mg/dL (ref 6–20)
CO2: 25 mmol/L (ref 22–32)
Calcium: 7.6 mg/dL — ABNORMAL LOW (ref 8.9–10.3)
Chloride: 103 mmol/L (ref 98–111)
Creatinine, Ser: 0.84 mg/dL (ref 0.44–1.00)
GFR, Estimated: >60 mL/min (ref >60)
Glucose, Bld: 125 mg/dL — ABNORMAL HIGH (ref 70–99)
Potassium: 3.6 mmol/L (ref 3.5–5.1)
Sodium: 137 mmol/L (ref 135–145)
Total Bilirubin: 1 mg/dL (ref 0.3–1.2)
Total Protein: 5.7 g/dL — ABNORMAL LOW (ref 6.5–8.1)

## 2020-02-08 LAB — C-REACTIVE PROTEIN: CRP: 10.5 mg/dL — ABNORMAL HIGH (ref <1.0)

## 2020-02-08 LAB — BRAIN NATRIURETIC PEPTIDE: B Natriuretic Peptide: 40.7 pg/mL (ref 0.0–100.0)

## 2020-02-08 LAB — LACTATE DEHYDROGENASE: LDH: 306 U/L — ABNORMAL HIGH (ref 98–192)

## 2020-02-08 LAB — FIBRIN DERIVATIVES D-DIMER (ARMC ONLY): Fibrin derivatives D-dimer (ARMC): 1239.65 ng/mL (FEU) — ABNORMAL HIGH (ref 0.00–499.00)

## 2020-02-08 LAB — LACTIC ACID, PLASMA: Lactic Acid, Venous: 1.2 mmol/L (ref 0.5–1.9)

## 2020-02-08 LAB — FERRITIN: Ferritin: 313 ng/mL — ABNORMAL HIGH (ref 11–307)

## 2020-02-08 LAB — PROCALCITONIN: Procalcitonin: 1.56 ng/mL

## 2020-02-08 MED ORDER — LACTATED RINGERS IV BOLUS
500.0000 mL | Freq: Once | INTRAVENOUS | Status: AC
  Administered 2020-02-08: 500 mL via INTRAVENOUS

## 2020-02-08 MED ORDER — PROCHLORPERAZINE EDISYLATE 10 MG/2ML IJ SOLN
10.0000 mg | Freq: Once | INTRAMUSCULAR | Status: AC
  Administered 2020-02-08: 10 mg via INTRAVENOUS
  Filled 2020-02-08: qty 2

## 2020-02-08 MED ORDER — KETOROLAC TROMETHAMINE 30 MG/ML IJ SOLN
15.0000 mg | Freq: Four times a day (QID) | INTRAMUSCULAR | Status: AC | PRN
  Administered 2020-02-08 – 2020-02-12 (×10): 15 mg via INTRAVENOUS
  Filled 2020-02-08 (×10): qty 1

## 2020-02-08 MED ORDER — DIPHENHYDRAMINE HCL 25 MG PO CAPS
25.0000 mg | ORAL_CAPSULE | Freq: Once | ORAL | Status: AC
  Administered 2020-02-08: 25 mg via ORAL
  Filled 2020-02-08: qty 1

## 2020-02-08 MED ORDER — ENOXAPARIN SODIUM 100 MG/ML ~~LOC~~ SOLN
1.0000 mg/kg | Freq: Two times a day (BID) | SUBCUTANEOUS | Status: DC
  Administered 2020-02-08: 90 mg via SUBCUTANEOUS
  Filled 2020-02-08 (×2): qty 1

## 2020-02-08 MED ORDER — ORAL CARE MOUTH RINSE
15.0000 mL | Freq: Two times a day (BID) | OROMUCOSAL | Status: DC
  Administered 2020-02-09 – 2020-02-18 (×15): 15 mL via OROMUCOSAL

## 2020-02-08 MED ORDER — TECHNETIUM TO 99M ALBUMIN AGGREGATED
3.9340 | Freq: Once | INTRAVENOUS | Status: AC | PRN
  Administered 2020-02-08: 3.934 via INTRAVENOUS
  Filled 2020-02-08: qty 3.93

## 2020-02-08 MED ORDER — BUTALBITAL-APAP-CAFFEINE 50-325-40 MG PO TABS
2.0000 | ORAL_TABLET | Freq: Once | ORAL | Status: AC | PRN
  Administered 2020-02-08: 2 via ORAL
  Filled 2020-02-08: qty 2

## 2020-02-08 MED ORDER — SODIUM CHLORIDE 0.9 % IV SOLN
2.0000 g | INTRAVENOUS | Status: DC
  Administered 2020-02-08 – 2020-02-10 (×3): 2 g via INTRAVENOUS
  Filled 2020-02-08: qty 2
  Filled 2020-02-08: qty 20
  Filled 2020-02-08: qty 2

## 2020-02-08 MED ORDER — PHENOL 1.4 % MT LIQD
1.0000 | OROMUCOSAL | Status: DC | PRN
  Administered 2020-02-08: 1 via OROMUCOSAL
  Filled 2020-02-08: qty 177

## 2020-02-08 MED ORDER — DOXYCYCLINE HYCLATE 100 MG PO TABS
100.0000 mg | ORAL_TABLET | Freq: Two times a day (BID) | ORAL | Status: DC
  Administered 2020-02-08 – 2020-02-10 (×4): 100 mg via ORAL
  Filled 2020-02-08 (×5): qty 1

## 2020-02-08 MED ORDER — ENOXAPARIN SODIUM 60 MG/0.6ML ~~LOC~~ SOLN
0.5000 mg/kg | SUBCUTANEOUS | Status: DC
  Administered 2020-02-08 – 2020-02-17 (×10): 45 mg via SUBCUTANEOUS
  Filled 2020-02-08 (×11): qty 0.6

## 2020-02-08 MED ORDER — MORPHINE SULFATE (PF) 2 MG/ML IV SOLN
2.0000 mg | INTRAVENOUS | Status: DC | PRN
  Administered 2020-02-08 – 2020-02-18 (×11): 2 mg via INTRAVENOUS
  Filled 2020-02-08 (×11): qty 1

## 2020-02-08 NOTE — ED Notes (Signed)
Pt placed in hospital bed with assistance of [place], [place]. Call light in reach, bed locked and low.

## 2020-02-08 NOTE — Progress Notes (Signed)
Pt wears CPAP at home but can only wear our unit because of COVID. I stated that we only have full face mask with our unit and she declined to wear CPAP tonight but would call if she thinks she needs it.

## 2020-02-08 NOTE — Progress Notes (Addendum)
PROGRESS NOTE    Alicia [name]  MVH:[id number] DOB: 01/28/1971 DOA: 02/07/2020 PCP: [name], [name], MD   Chief Complaint  Patient presents with  . Shortness of Breath   Brief Narrative:  Alicia [name]  is Alicia [name] 49 y.o. Caucasian female with Alicia [name] known history of anxiety and depression, hypertension, dyslipidemia, irritable bowel syndrome, restless leg syndrome and sleep apnea on CPAP, presented to the emergency room with acute onset of worsening dyspnea with associated dry cough and wheezing.  She started having symptoms on Monday when she woke up with headache and chest congestion then had significant fatigue, mild upper and lower back pain as well as intermittent fever and chills.  She had no loss of taste or appetite however they did not feel normal.  No nausea or vomiting however she had diarrhea on Tuesday.  She has not been vaccinated against COVID-19.  She stated that she was concerned as she has fibromuscular dysplasia and is at high risk for clotting.  She's been admitted for COVID 19 pneumonia.  Currently receiving steroids and remdesivir.   Assessment & Plan:   Active Problems:   Acute hypoxemic respiratory failure due to COVID-19 [facility](HCC)  Acute hypoxemic respiratory failure secondary to COVID-19 Pneumonia  Concern for Superimposed Bacterial Pneumonia - currently requiring 3 L by [place], wean as tolerated for O2>88% - CT PE protocol with multifocal bilateral airspace disease c/w COVID 19 pneumonia - steroids, remdesivir --- consider baricitinib or actemra if worsening (though will hold off at this time with elevated PCT) - procalcitonin elevated, follow with abx for CAP - strict I/O, daily weights  - follow blood cultures, sputum cx, urine strep, urine legionella - prone as able, OOB, IS, flutter  COVID-19 Labs  Recent Labs    02/08/20 0734  FERRITIN 313*  LDH 306*  CRP 10.5*    Lab Results  Component Value Date   SARSCOV2NAA NEGATIVE 05/27/2019   SARSCOV2NAA  NEGATIVE 01/18/2019   SARSCOV2NAA Not Detected 05/07/2018   # Elevated D dimer: CT PE nondiagnostic, VQ scan low probability - suspected elevated in setting of covid, transition to prophylactic lovenox Follow LE US  #.  Essential hypertension. -We will continue her Coreg.  #.  Dyslipidemia. -Statin therapy will be resumed.  #.  Anxiety and depression. -We will continue her Paxil, Lamictal, Xanax XR and Ambien.  #.  GERD. -We will continue PPI therapy.  # Elevated LFTs: likely 2/2 covid, follow acute hepatitis panel  # Migraine - she notes chronic migraines at home, notes one at this time - prn ubrelvy - migraine cocktail   # Pancreatic Cyst: Follow outpatient  DVT prophylaxis: lovenox Code Status: full  Family Communication: none at bedside - husband over phone Disposition:   Status is: Inpatient  Remains inpatient appropriate because:Inpatient level of care appropriate due to severity of illness   Dispo: The patient is from: Home              Anticipated d/c is to: Home              Anticipated d/c date is: > 3 days              Patient currently is not medically stable to d/c.   Consultants:   none  Procedures:   none  Antimicrobials: Anti-infectives (From admission, onward)   Start     Dose/Rate Route Frequency Ordered Stop   02/08/20 1100  cefTRIAXone (ROCEPHIN) 2 g in sodium chloride 0.9 % 100 mL IVPB  2 g 200 mL/hr over 30 Minutes Intravenous Every 24 hours 02/08/20 1043 02/13/20 1059   02/08/20 1100  doxycycline (VIBRA-TABS) tablet 100 mg        100 mg Oral Every 12 hours 02/08/20 1043 02/13/20 0959   02/08/20 1000  remdesivir 100 mg in sodium chloride 0.9 % 100 mL IVPB       "Followed by" Linked Group Details   100 mg 200 mL/hr over 30 Minutes Intravenous Daily 02/07/20 2309 02/12/20 0959   02/08/20 1000  remdesivir 100 mg in sodium chloride 0.9 % 100 mL IVPB  Status:  Discontinued       "Followed by" Linked Group Details   100 mg 200  mL/hr over 30 Minutes Intravenous Daily 02/07/20 2335 02/07/20 2336   02/07/20 2345  remdesivir 200 mg in sodium chloride 0.9% 250 mL IVPB  Status:  Discontinued       "Followed by" Linked Group Details   200 mg 580 mL/hr over 30 Minutes Intravenous Once 02/07/20 2335 02/07/20 2336   02/07/20 2330  remdesivir 200 mg in sodium chloride 0.9% 250 mL IVPB       "Followed by" Linked Group Details   200 mg 580 mL/hr over 30 Minutes Intravenous Once 02/07/20 2309 02/08/20 0443     Subjective: C/o headaceh Sore throate No CP SOB  Objective: Vitals:   02/08/20 1145 02/08/20 1200 02/08/20 1619 02/08/20 1714  BP: 106/61 (!) 105/55 (!) 132/119 (!) 100/50  Pulse: (!) 101 (!) 102 (!) 108 95  Resp: (!) 25 (!) Temp:   97.9 F (36.6 C) 97.8 F (36.6 C)  TempSrc:      SpO2: 93% 91% 95% 95%  Weight:      Height:        Intake/Output Summary (Last 24 hours) at 02/08/2020 1731 Last data filed at 02/08/2020 1631 Gross per 24 hour  Intake 1350.67 ml  Output --  Net 1350.67 ml   Filed Weights   02/07/20 1939  Weight: 90.7 kg    Examination:  General exam: Appears uncomfortable, sitting in dark, head between legs Respiratory system: Clear to auscultation. Respiratory effort normal. Cardiovascular system: S1 & S2 heard, RRR Gastrointestinal system: Abdomen is nondistended, soft and nontender.  Central nervous system: Alert and oriented. No focal neurological deficits. Extremities: no LEE Skin: No rashes, lesions or ulcers Psychiatry: Judgement and insight appear normal. Mood & affect appropriate.     Data Reviewed: I have personally reviewed following labs and imaging studies  CBC: Recent Labs  Lab 02/04/20 1345 02/07/20 1938 02/08/20 0734  WBC 5.1 3.6* 4.6  NEUTROABS  --  2.8 3.7  HGB 14.4 12.6 12.4  HCT 41.1 36.8 35.8*  MCV 87.8 89.5 88.0  PLT 192 161 154    Basic Metabolic Panel: Recent Labs  Lab 02/04/20 1345 02/07/20 1938 02/08/20 0734  NA 135 138  137  K 3.5 3.6 3.6  CL 99 103 103  CO2 GLUCOSE 128* 130* 125*  BUN CREATININE 1.27* 0.93 0.84  CALCIUM 8.8* 7.8* 7.6*    GFR: Estimated Creatinine Clearance: 95.4 mL/min (by C-G formula based on SCr of 0.84 mg/dL).  Liver Function Tests: Recent Labs  Lab 02/07/20 1938 02/08/20 0734  AST 26 84*  ALT 17 49*  ALKPHOS 60 209*  BILITOT 0.8 1.0  PROT 6.3* 5.7*  ALBUMIN 3.3* 3.1*    CBG: No results for input(s): GLUCAP in the last 168 hours.  No results found for this or any previous visit (from the past 240 hour(s)).       Radiology Studies: CT Angio Chest PE W and/or Wo Contrast  Result Date: 02/07/2020 CLINICAL DATA:  COVID-19 positive 2 days ago, short of breath after antibody infusion, lower abdominal pain, positive D-dimer EXAM: CT ANGIOGRAPHY CHEST CT ABDOMEN AND PELVIS WITH CONTRAST TECHNIQUE: Multidetector CT imaging of the chest was performed using the standard protocol during bolus administration of intravenous contrast. Multiplanar CT image reconstructions and MIPs were obtained to evaluate the vascular anatomy. Multidetector CT imaging of the abdomen and pelvis was performed using the standard protocol during bolus administration of intravenous contrast. CONTRAST:  50mL OMNIPAQUE IOHEXOL 350 MG/ML SOLN COMPARISON:  02/07/2020, 05/05/2019 FINDINGS: CTA CHEST FINDINGS Cardiovascular: There is suboptimal contrast enhancement of the pulmonary vasculature, resulting in Gerado Nabers nondiagnostic evaluation for pulmonary embolus. The majority of the contrast is within the systemic arterial system, and the thoracic aorta is normal in appearance. Heart is unremarkable without pericardial effusion. Mediastinum/Nodes: No enlarged mediastinal, hilar, or axillary lymph nodes. Thyroid gland, trachea, and esophagus demonstrate no significant findings. Lungs/Pleura: Multifocal bilateral ground-glass airspace disease consistent with COVID 19 pneumonia. No effusion or  pneumothorax. The central airways are patent. Musculoskeletal: No acute or destructive bony lesions. Reconstructed images demonstrate no additional findings. Review of the MIP images confirms the above findings. CT ABDOMEN and PELVIS FINDINGS Hepatobiliary: No focal liver abnormality is seen. Status post cholecystectomy. No biliary dilatation. Pancreas: Stable cysts within the uncinate process of the pancreas measuring 1.6 x 1.0 cm, previously shown to reflect Loukas Antonson serous cystadenoma. The remainder of the pancreas is unremarkable. Spleen: Normal in size without focal abnormality. Adrenals/Urinary Tract: The kidneys enhance normally and symmetrically. No evidence of obstructive uropathy or urinary calculus. Bladder is unremarkable. The adrenals are normal. Stomach/Bowel: No bowel obstruction or ileus. Normal appendix. No bowel wall thickening or inflammatory changes. Vascular/Lymphatic: Left-sided IVC again noted, an anatomic variant. No significant atherosclerosis. No pathologic adenopathy. Reproductive: Status post hysterectomy. No adnexal masses. Other: There is trace pelvic free fluid, nonspecific. No free intraperitoneal gas. No abdominal wall hernia. Musculoskeletal: No acute bony abnormalities. Reconstructed images demonstrate no additional findings. Review of the MIP images confirms the above findings. IMPRESSION: 1. Nondiagnostic evaluation of the pulmonary vasculature due to timing of contrast bolus. Pulmonary emboli cannot be excluded. 2. Multifocal bilateral airspace disease consistent with COVID 19 pneumonia. 3. Trace pelvic free fluid, likely physiologic. 4. Stable pancreatic cyst, previously shown to represent Bernestine Holsapple serous cystadenoma. 5. Otherwise no acute intra-abdominal or intrapelvic process. Electronically Signed   By: Sharlet Salina M.D.   On: 02/07/2020 21:56   CT ABDOMEN PELVIS W CONTRAST  Result Date: 02/07/2020 CLINICAL DATA:  COVID-19 positive 2 days ago, short of breath after antibody  infusion, lower abdominal pain, positive D-dimer EXAM: CT ANGIOGRAPHY CHEST CT ABDOMEN AND PELVIS WITH CONTRAST TECHNIQUE: Multidetector CT imaging of the chest was performed using the standard protocol during bolus administration of intravenous contrast. Multiplanar CT image reconstructions and MIPs were obtained to evaluate the vascular anatomy. Multidetector CT imaging of the abdomen and pelvis was performed using the standard protocol during bolus administration of intravenous contrast. CONTRAST:  15mL OMNIPAQUE IOHEXOL 350 MG/ML SOLN COMPARISON:  02/07/2020, 05/05/2019 FINDINGS: CTA CHEST FINDINGS Cardiovascular: There is suboptimal contrast enhancement of the pulmonary vasculature, resulting in Monque Haggar nondiagnostic evaluation for pulmonary embolus. The majority of the contrast is within the systemic arterial system, and the thoracic aorta is normal in appearance. Heart  is unremarkable without pericardial effusion. Mediastinum/Nodes: No enlarged mediastinal, hilar, or axillary lymph nodes. Thyroid gland, trachea, and esophagus demonstrate no significant findings. Lungs/Pleura: Multifocal bilateral ground-glass airspace disease consistent with COVID 19 pneumonia. No effusion or pneumothorax. The central airways are patent. Musculoskeletal: No acute or destructive bony lesions. Reconstructed images demonstrate no additional findings. Review of the MIP images confirms the above findings. CT ABDOMEN and PELVIS FINDINGS Hepatobiliary: No focal liver abnormality is seen. Status post cholecystectomy. No biliary dilatation. Pancreas: Stable cysts within the uncinate process of the pancreas measuring 1.6 x 1.0 cm, previously shown to reflect Bilal Manzer serous cystadenoma. The remainder of the pancreas is unremarkable. Spleen: Normal in size without focal abnormality. Adrenals/Urinary Tract: The kidneys enhance normally and symmetrically. No evidence of obstructive uropathy or urinary calculus. Bladder is unremarkable. The adrenals  are normal. Stomach/Bowel: No bowel obstruction or ileus. Normal appendix. No bowel wall thickening or inflammatory changes. Vascular/Lymphatic: Left-sided IVC again noted, an anatomic variant. No significant atherosclerosis. No pathologic adenopathy. Reproductive: Status post hysterectomy. No adnexal masses. Other: There is trace pelvic free fluid, nonspecific. No free intraperitoneal gas. No abdominal wall hernia. Musculoskeletal: No acute bony abnormalities. Reconstructed images demonstrate no additional findings. Review of the MIP images confirms the above findings. IMPRESSION: 1. Nondiagnostic evaluation of the pulmonary vasculature due to timing of contrast bolus. Pulmonary emboli cannot be excluded. 2. Multifocal bilateral airspace disease consistent with COVID 19 pneumonia. 3. Trace pelvic free fluid, likely physiologic. 4. Stable pancreatic cyst, previously shown to represent Satina Jerrell serous cystadenoma. 5. Otherwise no acute intra-abdominal or intrapelvic process. Electronically Signed   By: Sharlet Salina M.D.   On: 02/07/2020 21:56   DG Chest Portable 1 View  Result Date: 02/07/2020 CLINICAL DATA:  COVID positive with shortness of breath. EXAM: PORTABLE CHEST 1 VIEW COMPARISON:  February 04, 2020 FINDINGS: Mild multifocal infiltrates are seen involving predominantly the periphery of the mid and lower lung fields, bilaterally. There is no evidence of Yochanan Eddleman pleural effusion or pneumothorax. The heart size and mediastinal contours are within normal limits. Ibraham Levi radiopaque loop recorder device is seen. The visualized skeletal structures are unremarkable. IMPRESSION: Mild bilateral multifocal infiltrates. Electronically Signed   By: Aram Candela M.D.   On: 02/07/2020 20:06        Scheduled Meds: . ALPRAZolam  1 mg Oral BH-q7a  . vitamin C  500 mg Oral Daily  . aspirin EC  81 mg Oral Daily  . carvedilol  3.125 mg Oral BID WC  . cholecalciferol  1,000 Units Oral Daily  . cyclobenzaprine  10 mg Oral QPM   . doxycycline  100 mg Oral Q12H  . enoxaparin (LOVENOX) injection  0.5 mg/kg Subcutaneous Q24H  . famotidine  20 mg Oral BID  . icosapent Ethyl  1 g Oral TID  . mouth rinse  15 mL Mouth Rinse BID  . methylPREDNISolone (SOLU-MEDROL) injection  1 mg/kg Intravenous BID   Followed by  . [START ON 02/11/2020] predniSONE  50 mg Oral Daily  . pantoprazole  40 mg Oral BH-q7a  . PARoxetine  60 mg Oral Daily  . rosuvastatin  40 mg Oral Daily  . Vitamin D (Ergocalciferol)  50,000 Units Oral Q7 days  . zinc sulfate  220 mg Oral Daily   Continuous Infusions: . sodium chloride 100 mL/hr at 02/08/20 1654  . cefTRIAXone (ROCEPHIN)  IV Stopped (02/08/20 1125)  . remdesivir 100 mg in NS 100 mL Stopped (02/08/20 1010)     LOS: 1 day  Time spent: over 30 min    Lacretia Nicks, MD [facility]   To contact the attending provider between 7A-7P or the covering provider during after hours 7P-7A, please log into the web site [email or web address] and access using universal [place] password for that web site. If you do not have the password, please call the hospital operator.  02/08/2020, 5:31 PM

## 2020-02-08 NOTE — Progress Notes (Signed)
Patient states that she felt like she couldn't  breathe and she could not take deep breaths When I tried to assess her lung sounds she was not able to take a deep breath but her sats are good at the moment. Messaged MD for an order for continuous pulse ox.

## 2020-02-09 ENCOUNTER — Inpatient Hospital Stay: Payer: BC Managed Care – PPO

## 2020-02-09 DIAGNOSIS — J1282 Pneumonia due to coronavirus disease 2019: Secondary | ICD-10-CM | POA: Diagnosis not present

## 2020-02-09 DIAGNOSIS — J9601 Acute respiratory failure with hypoxia: Secondary | ICD-10-CM | POA: Diagnosis present

## 2020-02-09 DIAGNOSIS — U071 COVID-19: Secondary | ICD-10-CM | POA: Diagnosis not present

## 2020-02-09 LAB — CBC WITH DIFFERENTIAL/PLATELET
Abs Immature Granulocytes: 0.01 10*3/uL (ref 0.00–0.07)
Basophils Absolute: 0 10*3/uL (ref 0.0–0.1)
Basophils Relative: 0 %
Eosinophils Absolute: 0 10*3/uL (ref 0.0–0.5)
Eosinophils Relative: 0 %
HCT: 32.8 % — ABNORMAL LOW (ref 36.0–46.0)
Hemoglobin: 11 g/dL — ABNORMAL LOW (ref 12.0–15.0)
Immature Granulocytes: 0 %
Lymphocytes Relative: 20 %
Lymphs Abs: 0.6 10*3/uL — ABNORMAL LOW (ref 0.7–4.0)
MCH: 30.2 pg (ref 26.0–34.0)
MCHC: 33.5 g/dL (ref 30.0–36.0)
MCV: 90.1 fL (ref 80.0–100.0)
Monocytes Absolute: 0.1 10*3/uL (ref 0.1–1.0)
Monocytes Relative: 3 %
Neutro Abs: 2.4 10*3/uL (ref 1.7–7.7)
Neutrophils Relative %: 77 %
Platelets: 177 10*3/uL (ref 150–400)
RBC: 3.64 MIL/uL — ABNORMAL LOW (ref 3.87–5.11)
RDW: 13.4 % (ref 11.5–15.5)
WBC: 3.1 10*3/uL — ABNORMAL LOW (ref 4.0–10.5)
nRBC: 0 % (ref 0.0–0.2)

## 2020-02-09 LAB — COMPREHENSIVE METABOLIC PANEL
ALT: 37 U/L (ref 0–44)
AST: 44 U/L — ABNORMAL HIGH (ref 15–41)
Albumin: 2.7 g/dL — ABNORMAL LOW (ref 3.5–5.0)
Alkaline Phosphatase: 163 U/L — ABNORMAL HIGH (ref 38–126)
Anion gap: 8 (ref 5–15)
BUN: 12 mg/dL (ref 6–20)
CO2: 25 mmol/L (ref 22–32)
Calcium: 7.7 mg/dL — ABNORMAL LOW (ref 8.9–10.3)
Chloride: 106 mmol/L (ref 98–111)
Creatinine, Ser: 0.69 mg/dL (ref 0.44–1.00)
GFR, Estimated: >60 mL/min (ref >60)
Glucose, Bld: 176 mg/dL — ABNORMAL HIGH (ref 70–99)
Potassium: 3.9 mmol/L (ref 3.5–5.1)
Sodium: 139 mmol/L (ref 135–145)
Total Bilirubin: 0.6 mg/dL (ref 0.3–1.2)
Total Protein: 5.5 g/dL — ABNORMAL LOW (ref 6.5–8.1)

## 2020-02-09 LAB — FIBRIN DERIVATIVES D-DIMER (ARMC ONLY): Fibrin derivatives D-dimer (ARMC): 889.63 ng/mL (FEU) — ABNORMAL HIGH (ref 0.00–499.00)

## 2020-02-09 LAB — C-REACTIVE PROTEIN: CRP: 8.4 mg/dL — ABNORMAL HIGH (ref <1.0)

## 2020-02-09 LAB — FERRITIN: Ferritin: 388 ng/mL — ABNORMAL HIGH (ref 11–307)

## 2020-02-09 MED ORDER — ADULT MULTIVITAMIN W/MINERALS CH
1.0000 | ORAL_TABLET | Freq: Every day | ORAL | Status: DC
  Administered 2020-02-09 – 2020-02-18 (×10): 1 via ORAL
  Filled 2020-02-09 (×10): qty 1

## 2020-02-09 MED ORDER — ENSURE ENLIVE PO LIQD
237.0000 mL | Freq: Three times a day (TID) | ORAL | Status: DC
  Administered 2020-02-09 – 2020-02-18 (×22): 237 mL via ORAL

## 2020-02-09 NOTE — Progress Notes (Signed)
Initial Nutrition Assessment  DOCUMENTATION CODES:   Obesity unspecified  INTERVENTION:  Ensure Enlive poTID, each supplement provides 350 kcal and 20 grams of protein  Magic cup TID with meals, each supplement provides 290 kcal and 9 grams of protein  MVI with minerals daily  NUTRITION DIAGNOSIS:   Increased nutrient needs related to acute illness (pneumonia due to COVID-19 virus infection) as evidenced by estimated needs.  GOAL:   Patient will meet greater than or equal to 90% of their needs    MONITOR:   Labs,I & O's,Supplement acceptance,PO intake,Weight trends  REASON FOR ASSESSMENT:   Malnutrition Screening Tool    ASSESSMENT:  RD working remotely.   49 year old female with history of anxiety, depression, HTN, HLD, IBS, RLS, GERD, sleep apnea on CPAP, fibromuscular dysplasia, and stoke presented with acute onset of worsening dyspnea, dry cough, wheezing, headache, fatigue, and chest congestion over the last couple of days. Patient admitted with COVID-19 pneumonia.  Spoke with patient via phone, says she is doing good today. Recalls eating jello, pudding, applesauce and juice for breakfast. Patient reports very poor po over the past few days and feels full after minimal intake. She states her baseline appetite is probably too good. She denies loss of taste/smell, nausea or vomiting. Had one small episode of diarrhea yesterday. Patient reports usually weighing around 200 lbs. Per chart, weights have been fairly stable over the past year. In the last 5 days, weights have trended down ~3 lbs (1.3%) which is concerning. RD educated on increased calorie/protein needs and discussed the importance of adequate nutrition. She is agreeable to drinking strawberry flavored Ensure to help meet her needs.   Medications reviewed and include: Xanax, Vit C, D3, Flexeril, Doxycycline, Pepcid, Vascepa, Methylprednisolone, Protonix, Drisdol, Paxil, Zinc sulfate, Rocephin, Remdesivir  IVF:  NaCl @ 100 ml/hr  Labs reviewed   NUTRITION - FOCUSED PHYSICAL EXAM: Unable to complete at this time, RD working remotely.  Diet Order:   Diet Order            Diet Heart Room service appropriate? Yes; Fluid consistency: Thin  Diet effective now                 EDUCATION NEEDS:   Education needs have been addressed  Skin:     Last BM:  12/21  Height:   Ht Readings from Last 1 Encounters:  02/07/20 5\' 8"  (1.727 m)    Weight:   Wt Readings from Last 1 Encounters:  02/09/20 89.5 kg    BMI:  Body mass index is 30 kg/m.  Estimated Nutritional Needs:   Kcal:  02/11/20  Protein:  115-134  Fluid:  >1.8 L   [id number], RD, LDN Clinical Nutrition After Hours/Weekend Pager # in Amion

## 2020-02-09 NOTE — Progress Notes (Addendum)
Progress Note    Alicia [name] Note Osinski  OAC:[id number] DOB: March 12, 1970  DOA: 02/07/2020 PCP: Alba Cory, MD      Brief Narrative:    Medical records reviewed and are as summarized below:  Alicia [name] is a 49 y.o. female with a known history of anxiety and depression, hypertension, dyslipidemia, irritable bowel syndrome, restless leg syndrome and sleep apnea on CPAP, presented to the emergency room with acute onset of worsening dyspnea with associateddry cough and wheezing.       Assessment/Plan:   Principal Problem:   Pneumonia due to COVID-19 virus Active Problems:   Acute hypoxemic respiratory failure (HCC)   Acute hypoxemic respiratory failure secondary to COVID-19 pneumonia, questionable superimposed bacterial pneumonia:   Positive Covid test on 02/04/2020 at an urgent care center Continue 3 L/min oxygen via nasal cannula and taper off as able. Continue IV steroids and IV remdesivir infusion. Continue empiric IV antibiotics for now.  Plan to discontinue antibiotics if patient remains stable over the next 24 hours. Discontinue IV fluids. Follow-up blood and sputum cultures. No evidence of pulmonary embolism.  COVID-19 Labs  Recent Labs    02/08/20 0734 02/09/20 0437  FERRITIN 313* 388*  LDH 306*  --   CRP 10.5* 8.4*    Lab Results  Component Value Date   SARSCOV2NAA NEGATIVE 05/27/2019   SARSCOV2NAA NEGATIVE 01/18/2019   SARSCOV2NAA Not Detected 05/07/2018     Elevated liver enzymes: This is likely due to COVID-19 infection.  Liver enzymes are trending down.  Leukopenia: Likely related to COVID-19 infection.  Monitor CBC closely.  Other comorbidities include hypertension, dyslipidemia, anxiety, depression, GERD, migraine.  Continue home medications.  Body mass index is 30 kg/m.  (Obesity)       Diet Order            Diet Heart Room service appropriate? Yes; Fluid consistency: Thin  Diet effective now                     Consultants:  None  Procedures:  None    Medications:   . ALPRAZolam  1 mg Oral BH-q7a  . vitamin C  500 mg Oral Daily  . aspirin EC  81 mg Oral Daily  . carvedilol  3.125 mg Oral BID WC  . cholecalciferol  1,000 Units Oral Daily  . cyclobenzaprine  10 mg Oral QPM  . doxycycline  100 mg Oral Q12H  . enoxaparin (LOVENOX) injection  0.5 mg/kg Subcutaneous Q24H  . famotidine  20 mg Oral BID  . feeding supplement  237 mL Oral TID BM  . icosapent Ethyl  1 g Oral TID  . mouth rinse  15 mL Mouth Rinse BID  . methylPREDNISolone (SOLU-MEDROL) injection  1 mg/kg Intravenous BID   Followed by  . [START ON 02/11/2020] predniSONE  50 mg Oral Daily  . multivitamin with minerals  1 tablet Oral Daily  . pantoprazole  40 mg Oral BH-q7a  . PARoxetine  60 mg Oral Daily  . rosuvastatin  40 mg Oral Daily  . Vitamin D (Ergocalciferol)  50,000 Units Oral Q7 days  . zinc sulfate  220 mg Oral Daily   Continuous Infusions: . sodium chloride 100 mL/hr at 02/09/20 0754  . cefTRIAXone (ROCEPHIN)  IV 2 g (02/09/20 1105)  . remdesivir 100 mg in NS 100 mL 100 mg (02/09/20 0807)     Anti-infectives (From admission, onward)   Start     Dose/Rate Route  Frequency Ordered Stop   02/08/20 1100  cefTRIAXone (ROCEPHIN) 2 g in sodium chloride 0.9 % 100 mL IVPB        2 g 200 mL/hr over 30 Minutes Intravenous Every 24 hours 02/08/20 1043 02/13/20 1059   02/08/20 1100  doxycycline (VIBRA-TABS) tablet 100 mg        100 mg Oral Every 12 hours 02/08/20 1043 02/13/20 0959   02/08/20 1000  remdesivir 100 mg in sodium chloride 0.9 % 100 mL IVPB       "Followed by" Linked Group Details   100 mg 200 mL/hr over 30 Minutes Intravenous Daily 02/07/20 2309 02/12/20 0959   02/08/20 1000  remdesivir 100 mg in sodium chloride 0.9 % 100 mL IVPB  Status:  Discontinued       "Followed by" Linked Group Details   100 mg 200 mL/hr over 30 Minutes Intravenous Daily 02/07/20 2335 02/07/20 2336   02/07/20 2345   remdesivir 200 mg in sodium chloride 0.9% 250 mL IVPB  Status:  Discontinued       "Followed by" Linked Group Details   200 mg 580 mL/hr over 30 Minutes Intravenous Once 02/07/20 2335 02/07/20 2336   02/07/20 2330  remdesivir 200 mg in sodium chloride 0.9% 250 mL IVPB       "Followed by" Linked Group Details   200 mg 580 mL/hr over 30 Minutes Intravenous Once 02/07/20 2309 02/08/20 0443             Family Communication/Anticipated D/C date and plan/Code Status   DVT prophylaxis:      Code Status: Full Code  Family Communication: None Disposition Plan:    Status is: Inpatient  Remains inpatient appropriate because:IV treatments appropriate due to intensity of illness or inability to take PO   Dispo: The patient is from: Home              Anticipated d/c is to: Home              Anticipated d/c date is: 3 days              Patient currently is not medically stable to d/c.           Subjective:   c/o cough and pleuritic chest pain  Objective:    Vitals:   02/09/20 0346 02/09/20 0500 02/09/20 0700 02/09/20 1222  BP: 110/75  107/85 (!) 92/46  Pulse: 77  78 92  Resp: 16  16 18   Temp: 97.9 F (36.6 C)  98.4 F (36.9 C) 97.8 F (36.6 C)  TempSrc: Oral  Oral   SpO2: 91%  94% 91%  Weight:  89.5 kg    Height:       No data found.   Intake/Output Summary (Last 24 hours) at 02/09/2020 1341 Last data filed at 02/09/2020 0320 Gross per 24 hour  Intake 2393.73 ml  Output --  Net 2393.73 ml   Filed Weights   02/07/20 1939 02/09/20 0500  Weight: 90.7 kg 89.5 kg    Exam:  GEN: NAD SKIN: Warm and dry EYES: No pallor or icterus ENT: MMM CV: RRR PULM: CTA B ABD: soft, obese, NT, +BS CNS: AAO x 3, non focal EXT: No edema or tenderness   Data Reviewed:   I have personally reviewed following labs and imaging studies:  Labs: Labs show the following:   Basic Metabolic Panel: Recent Labs  Lab 02/04/20 1345 02/07/20 1938 02/08/20 0734  02/09/20 0437  NA 135 138 137  139  K 3.5 3.6 3.6 3.9  CL 99 103 103 106  CO2 24 25 25 25   GLUCOSE 128* 130* 125* 176*  BUN 17 15 10 12   CREATININE 1.27* 0.93 0.84 0.69  CALCIUM 8.8* 7.8* 7.6* 7.7*   GFR Estimated Creatinine Clearance: 99.5 mL/min (by C-G formula based on SCr of 0.69 mg/dL). Liver Function Tests: Recent Labs  Lab 02/07/20 1938 02/08/20 0734 02/09/20 0437  AST 26 84* 44*  ALT 17 49* 37  ALKPHOS 60 209* 163*  BILITOT 0.8 1.0 0.6  PROT 6.3* 5.7* 5.5*  ALBUMIN 3.3* 3.1* 2.7*   Recent Labs  Lab 02/07/20 2147  LIPASE 80*   No results for input(s): AMMONIA in the last 168 hours. Coagulation profile No results for input(s): INR, PROTIME in the last 168 hours.  CBC: Recent Labs  Lab 02/04/20 1345 02/07/20 1938 02/08/20 0734 02/09/20 0437  WBC 5.1 3.6* 4.6 3.1*  NEUTROABS  --  2.8 3.7 2.4  HGB 14.4 12.6 12.4 11.0*  HCT 41.1 36.8 35.8* 32.8*  MCV 87.8 89.5 88.0 90.1  PLT 192 161 154 177   Cardiac Enzymes: No results for input(s): CKTOTAL, CKMB, CKMBINDEX, TROPONINI in the last 168 hours. BNP (last 3 results) No results for input(s): PROBNP in the last 8760 hours. CBG: No results for input(s): GLUCAP in the last 168 hours. D-Dimer: No results for input(s): DDIMER in the last 72 hours. Hgb A1c: No results for input(s): HGBA1C in the last 72 hours. Lipid Profile: No results for input(s): CHOL, HDL, LDLCALC, TRIG, CHOLHDL, LDLDIRECT in the last 72 hours. Thyroid function studies: No results for input(s): TSH, T4TOTAL, T3FREE, THYROIDAB in the last 72 hours.  Invalid input(s): FREET3 Anemia work up: Recent Labs    02/08/20 0734 02/09/20 0437  FERRITIN 313* 388*   Sepsis Labs: Recent Labs  Lab 02/04/20 1345 02/07/20 1938 02/07/20 2147 02/08/20 0734 02/08/20 0938 02/09/20 0437  PROCALCITON  --   --   --  1.56  --   --   WBC 5.1 3.6*  --  4.6  --  3.1*  LATICACIDVEN 1.3 1.6 3.2*  --  1.2  --     Microbiology Recent Results (from the  past 240 hour(s))  Culture, blood (routine x 2)     Status: None (Preliminary result)   Collection Time: 02/07/20  7:38 PM   Specimen: BLOOD  Result Value Ref Range Status   Specimen Description BLOOD RIGHT ANTECUBITAL  Final   Special Requests   Final    BOTTLES DRAWN AEROBIC AND ANAEROBIC Blood Culture results may not be optimal due to an inadequate volume of blood received in culture bottles   Culture   Final    NO GROWTH < 24 HOURS Performed at [facility], [address] [place], [place] [address]    Report Status PENDING  Incomplete  Culture, blood (routine x 2)     Status: None (Preliminary result)   Collection Time: 02/07/20  7:38 PM   Specimen: BLOOD  Result Value Ref Range Status   Specimen Description BLOOD LEFT ANTECUBITAL  Final   Special Requests   Final    BOTTLES DRAWN AEROBIC AND ANAEROBIC Blood Culture adequate volume   Culture   Final    NO GROWTH < 24 HOURS Performed at [facility], [address] [place], [place] [address]    Report Status PENDING  Incomplete    Procedures and diagnostic studies:  CT Angio Chest PE W and/or Wo  Contrast  Result Date: 02/07/2020 CLINICAL DATA:  COVID-19 positive 2 days ago, short of breath after antibody infusion, lower abdominal pain, positive D-dimer EXAM: CT ANGIOGRAPHY CHEST CT ABDOMEN AND PELVIS WITH CONTRAST TECHNIQUE: Multidetector CT imaging of the chest was performed using the standard protocol during bolus administration of intravenous contrast. Multiplanar CT image reconstructions and MIPs were obtained to evaluate the vascular anatomy. Multidetector CT imaging of the abdomen and pelvis was performed using the standard protocol during bolus administration of intravenous contrast. CONTRAST:  36mL OMNIPAQUE IOHEXOL 350 MG/ML SOLN COMPARISON:  02/07/2020, 05/05/2019 FINDINGS: CTA CHEST FINDINGS Cardiovascular: There is suboptimal contrast enhancement of the pulmonary vasculature, resulting in a  nondiagnostic evaluation for pulmonary embolus. The majority of the contrast is within the systemic arterial system, and the thoracic aorta is normal in appearance. Heart is unremarkable without pericardial effusion. Mediastinum/Nodes: No enlarged mediastinal, hilar, or axillary lymph nodes. Thyroid gland, trachea, and esophagus demonstrate no significant findings. Lungs/Pleura: Multifocal bilateral ground-glass airspace disease consistent with COVID 19 pneumonia. No effusion or pneumothorax. The central airways are patent. Musculoskeletal: No acute or destructive bony lesions. Reconstructed images demonstrate no additional findings. Review of the MIP images confirms the above findings. CT ABDOMEN and PELVIS FINDINGS Hepatobiliary: No focal liver abnormality is seen. Status post cholecystectomy. No biliary dilatation. Pancreas: Stable cysts within the uncinate process of the pancreas measuring 1.6 x 1.0 cm, previously shown to reflect a serous cystadenoma. The remainder of the pancreas is unremarkable. Spleen: Normal in size without focal abnormality. Adrenals/Urinary Tract: The kidneys enhance normally and symmetrically. No evidence of obstructive uropathy or urinary calculus. Bladder is unremarkable. The adrenals are normal. Stomach/Bowel: No bowel obstruction or ileus. Normal appendix. No bowel wall thickening or inflammatory changes. Vascular/Lymphatic: Left-sided IVC again noted, an anatomic variant. No significant atherosclerosis. No pathologic adenopathy. Reproductive: Status post hysterectomy. No adnexal masses. Other: There is trace pelvic free fluid, nonspecific. No free intraperitoneal gas. No abdominal wall hernia. Musculoskeletal: No acute bony abnormalities. Reconstructed images demonstrate no additional findings. Review of the MIP images confirms the above findings. IMPRESSION: 1. Nondiagnostic evaluation of the pulmonary vasculature due to timing of contrast bolus. Pulmonary emboli cannot be  excluded. 2. Multifocal bilateral airspace disease consistent with COVID 19 pneumonia. 3. Trace pelvic free fluid, likely physiologic. 4. Stable pancreatic cyst, previously shown to represent a serous cystadenoma. 5. Otherwise no acute intra-abdominal or intrapelvic process. Electronically Signed   By: Sharlet Salina M.D.   On: 02/07/2020 21:56   NM Pulmonary Perfusion  Result Date: 02/08/2020 CLINICAL DATA:  49 year old female with elevated D-dimer. Positive COVID-19. EXAM: NUCLEAR MEDICINE PERFUSION LUNG SCAN TECHNIQUE: Perfusion images were obtained in multiple projections after intravenous injection of radiopharmaceutical. Ventilation scans intentionally deferred if perfusion scan and chest x-ray adequate for interpretation during COVID 19 epidemic. RADIOPHARMACEUTICALS:  3.934 mCi Tc-82m MAA IV COMPARISON:  Chest radiograph dated 02/07/2020 and CT dated 02/07/2020 FINDINGS: Small areas of peripheral heterogeneity and defect primarily in the periphery and subpleural lung corresponds to the opacity seen on the chest radiograph. No large or segmental perfusion defect identified. IMPRESSION: Low probability for pulmonary embolism. Electronically Signed   By: Elgie Collard M.D.   On: 02/08/2020 17:44   CT ABDOMEN PELVIS W CONTRAST  Result Date: 02/07/2020 CLINICAL DATA:  COVID-19 positive 2 days ago, short of breath after antibody infusion, lower abdominal pain, positive D-dimer EXAM: CT ANGIOGRAPHY CHEST CT ABDOMEN AND PELVIS WITH CONTRAST TECHNIQUE: Multidetector CT imaging of the chest was performed using the standard  protocol during bolus administration of intravenous contrast. Multiplanar CT image reconstructions and MIPs were obtained to evaluate the vascular anatomy. Multidetector CT imaging of the abdomen and pelvis was performed using the standard protocol during bolus administration of intravenous contrast. CONTRAST:  75mL OMNIPAQUE IOHEXOL 350 MG/ML SOLN COMPARISON:  02/07/2020, 05/05/2019  FINDINGS: CTA CHEST FINDINGS Cardiovascular: There is suboptimal contrast enhancement of the pulmonary vasculature, resulting in a nondiagnostic evaluation for pulmonary embolus. The majority of the contrast is within the systemic arterial system, and the thoracic aorta is normal in appearance. Heart is unremarkable without pericardial effusion. Mediastinum/Nodes: No enlarged mediastinal, hilar, or axillary lymph nodes. Thyroid gland, trachea, and esophagus demonstrate no significant findings. Lungs/Pleura: Multifocal bilateral ground-glass airspace disease consistent with COVID 19 pneumonia. No effusion or pneumothorax. The central airways are patent. Musculoskeletal: No acute or destructive bony lesions. Reconstructed images demonstrate no additional findings. Review of the MIP images confirms the above findings. CT ABDOMEN and PELVIS FINDINGS Hepatobiliary: No focal liver abnormality is seen. Status post cholecystectomy. No biliary dilatation. Pancreas: Stable cysts within the uncinate process of the pancreas measuring 1.6 x 1.0 cm, previously shown to reflect a serous cystadenoma. The remainder of the pancreas is unremarkable. Spleen: Normal in size without focal abnormality. Adrenals/Urinary Tract: The kidneys enhance normally and symmetrically. No evidence of obstructive uropathy or urinary calculus. Bladder is unremarkable. The adrenals are normal. Stomach/Bowel: No bowel obstruction or ileus. Normal appendix. No bowel wall thickening or inflammatory changes. Vascular/Lymphatic: Left-sided IVC again noted, an anatomic variant. No significant atherosclerosis. No pathologic adenopathy. Reproductive: Status post hysterectomy. No adnexal masses. Other: There is trace pelvic free fluid, nonspecific. No free intraperitoneal gas. No abdominal wall hernia. Musculoskeletal: No acute bony abnormalities. Reconstructed images demonstrate no additional findings. Review of the MIP images confirms the above findings.  IMPRESSION: 1. Nondiagnostic evaluation of the pulmonary vasculature due to timing of contrast bolus. Pulmonary emboli cannot be excluded. 2. Multifocal bilateral airspace disease consistent with COVID 19 pneumonia. 3. Trace pelvic free fluid, likely physiologic. 4. Stable pancreatic cyst, previously shown to represent a serous cystadenoma. 5. Otherwise no acute intra-abdominal or intrapelvic process. Electronically Signed   By: Sharlet Salina M.D.   On: 02/07/2020 21:56   DG Chest Portable 1 View  Result Date: 02/07/2020 CLINICAL DATA:  COVID positive with shortness of breath. EXAM: PORTABLE CHEST 1 VIEW COMPARISON:  February 04, 2020 FINDINGS: Mild multifocal infiltrates are seen involving predominantly the periphery of the mid and lower lung fields, bilaterally. There is no evidence of a pleural effusion or pneumothorax. The heart size and mediastinal contours are within normal limits. A radiopaque loop recorder device is seen. The visualized skeletal structures are unremarkable. IMPRESSION: Mild bilateral multifocal infiltrates. Electronically Signed   By: Aram Candela M.D.   On: 02/07/2020 20:06               LOS: 2 days   Zolton Dowson  [facility]   Pager on [email or web address]. If 7PM-7AM, please contact night-coverage at [email or web address]     02/09/2020, 1:41 PM

## 2020-02-10 DIAGNOSIS — U071 COVID-19: Secondary | ICD-10-CM | POA: Diagnosis not present

## 2020-02-10 DIAGNOSIS — J1282 Pneumonia due to coronavirus disease 2019: Secondary | ICD-10-CM | POA: Diagnosis not present

## 2020-02-10 DIAGNOSIS — J9601 Acute respiratory failure with hypoxia: Secondary | ICD-10-CM | POA: Diagnosis not present

## 2020-02-10 LAB — COMPREHENSIVE METABOLIC PANEL
ALT: 30 U/L (ref 0–44)
AST: 26 U/L (ref 15–41)
Albumin: 2.9 g/dL — ABNORMAL LOW (ref 3.5–5.0)
Alkaline Phosphatase: 141 U/L — ABNORMAL HIGH (ref 38–126)
Anion gap: 8 (ref 5–15)
BUN: 19 mg/dL (ref 6–20)
CO2: 27 mmol/L (ref 22–32)
Calcium: 8.2 mg/dL — ABNORMAL LOW (ref 8.9–10.3)
Chloride: 107 mmol/L (ref 98–111)
Creatinine, Ser: 0.71 mg/dL (ref 0.44–1.00)
GFR, Estimated: >60 mL/min (ref >60)
Glucose, Bld: 178 mg/dL — ABNORMAL HIGH (ref 70–99)
Potassium: 4 mmol/L (ref 3.5–5.1)
Sodium: 142 mmol/L (ref 135–145)
Total Bilirubin: 0.5 mg/dL (ref 0.3–1.2)
Total Protein: 5.7 g/dL — ABNORMAL LOW (ref 6.5–8.1)

## 2020-02-10 LAB — CBC WITH DIFFERENTIAL/PLATELET
Abs Immature Granulocytes: 0.05 10*3/uL (ref 0.00–0.07)
Basophils Absolute: 0 10*3/uL (ref 0.0–0.1)
Basophils Relative: 0 %
Eosinophils Absolute: 0 10*3/uL (ref 0.0–0.5)
Eosinophils Relative: 0 %
HCT: 32.8 % — ABNORMAL LOW (ref 36.0–46.0)
Hemoglobin: 11.2 g/dL — ABNORMAL LOW (ref 12.0–15.0)
Immature Granulocytes: 1 %
Lymphocytes Relative: 10 %
Lymphs Abs: 0.7 10*3/uL (ref 0.7–4.0)
MCH: 31 pg (ref 26.0–34.0)
MCHC: 34.1 g/dL (ref 30.0–36.0)
MCV: 90.9 fL (ref 80.0–100.0)
Monocytes Absolute: 0.4 10*3/uL (ref 0.1–1.0)
Monocytes Relative: 5 %
Neutro Abs: 5.8 10*3/uL (ref 1.7–7.7)
Neutrophils Relative %: 84 %
Platelets: 236 10*3/uL (ref 150–400)
RBC: 3.61 MIL/uL — ABNORMAL LOW (ref 3.87–5.11)
RDW: 13.5 % (ref 11.5–15.5)
WBC: 6.8 10*3/uL (ref 4.0–10.5)
nRBC: 0 % (ref 0.0–0.2)

## 2020-02-10 LAB — FERRITIN: Ferritin: 305 ng/mL (ref 11–307)

## 2020-02-10 LAB — C-REACTIVE PROTEIN: CRP: 2.9 mg/dL — ABNORMAL HIGH (ref <1.0)

## 2020-02-10 LAB — FIBRIN DERIVATIVES D-DIMER (ARMC ONLY): Fibrin derivatives D-dimer (ARMC): 833.96 ng/mL (FEU) — ABNORMAL HIGH (ref 0.00–499.00)

## 2020-02-10 NOTE — Progress Notes (Signed)
**Alicia Alicia** Progress Alicia    Alicia GuadeloupeDorothy Van Alicia Alicia Alicia  WGN:[id number] DOB: 1970/03/01  DOA: 02/07/2020 PCP: [name], [name], MD      Brief Narrative:    Medical records reviewed and are as summarized below:  Alicia Alicia is a 49 y.o. female with a known history of anxiety and depression, hypertension, dyslipidemia, irritable bowel syndrome, restless leg syndrome and sleep apnea on CPAP, presented to the emergency room with acute onset of worsening dyspnea with associateddry cough and wheezing.       Assessment/Plan:   Principal Problem:   Pneumonia due to COVID-19 virus Active Problems:   Acute hypoxemic respiratory failure (HCC)   Acute hypoxemic respiratory failure secondary to COVID-19 pneumonia:  Positive Covid test on 02/04/2020 at an urgent care center Continue 3 L/min oxygen via nasal cannula and taper off as able. Continue IV steroids and neb remdesivir infusion. Doubt bacterial pneumonia so discontinue empiric antibiotics. Follow-up blood and sputum cultures. No evidence of pulmonary embolism.  COVID-19 Labs  Recent Labs    02/08/20 0734 02/09/20 0437 02/10/20 0529  FERRITIN 313* 388* 305  LDH 306*  --   --   CRP 10.5* 8.4* 2.9*    Lab Results  Component Value Date   SARSCOV2NAA NEGATIVE 05/27/2019   SARSCOV2NAA NEGATIVE 01/18/2019   SARSCOV2NAA Not Detected 05/07/2018     Elevated liver enzymes: Resolved.   Leukopenia: Improved.  This was likely related to COVID-19 infection.  Other comorbidities include hypertension, dyslipidemia, anxiety, depression, GERD, migraine.  Continue home medications.  Body mass index is 31.41 kg/m.  (Obesity)       Diet Order            Diet Heart Room service appropriate? Yes; Fluid consistency: Thin  Diet effective now                    Consultants:  None  Procedures:  None    Medications:   . ALPRAZolam  1 mg Oral BH-q7a  . vitamin C  500 mg Oral Daily  . aspirin EC  81 mg  Oral Daily  . carvedilol  3.125 mg Oral BID WC  . cholecalciferol  1,000 Units Oral Daily  . cyclobenzaprine  10 mg Oral QPM  . enoxaparin (LOVENOX) injection  0.5 mg/kg Subcutaneous Q24H  . famotidine  20 mg Oral BID  . feeding supplement  237 mL Oral TID BM  . icosapent Ethyl  1 g Oral TID  . mouth rinse  15 mL Mouth Rinse BID  . multivitamin with minerals  1 tablet Oral Daily  . pantoprazole  40 mg Oral BH-q7a  . PARoxetine  60 mg Oral Daily  . [START ON 02/11/2020] predniSONE  50 mg Oral Daily  . rosuvastatin  40 mg Oral Daily  . Vitamin D (Ergocalciferol)  50,000 Units Oral Q7 days  . zinc sulfate  220 mg Oral Daily   Continuous Infusions: . remdesivir 100 mg in NS 100 mL Stopped (02/10/20 0935)     Anti-infectives (From admission, onward)   Start     Dose/Rate Route Frequency Ordered Stop   02/08/20 1100  cefTRIAXone (ROCEPHIN) 2 g in sodium chloride 0.9 % 100 mL IVPB  Status:  Discontinued        2 g 200 mL/hr over 30 Minutes Intravenous Every 24 hours 02/08/20 1043 02/10/20 1407   02/08/20 1100  doxycycline (VIBRA-TABS) tablet 100 mg  Status:  Discontinued  100 mg Oral Every 12 hours 02/08/20 1043 02/10/20 1407   02/08/20 1000  remdesivir 100 mg in sodium chloride 0.9 % 100 mL IVPB       "Followed by" Linked Group Details   100 mg 200 mL/hr over 30 Minutes Intravenous Daily 02/07/20 2309 02/12/20 0959   02/08/20 1000  remdesivir 100 mg in sodium chloride 0.9 % 100 mL IVPB  Status:  Discontinued       "Followed by" Linked Group Details   100 mg 200 mL/hr over 30 Minutes Intravenous Daily 02/07/20 2335 02/07/20 2336   02/07/20 2345  remdesivir 200 mg in sodium chloride 0.9% 250 mL IVPB  Status:  Discontinued       "Followed by" Linked Group Details   200 mg 580 mL/hr over 30 Minutes Intravenous Once 02/07/20 2335 02/07/20 2336   02/07/20 2330  remdesivir 200 mg in sodium chloride 0.9% 250 mL IVPB       "Followed by" Linked Group Details   200 mg 580 mL/hr over  30 Minutes Intravenous Once 02/07/20 2309 02/08/20 0443             Family Communication/Anticipated D/C date and plan/Code Status   DVT prophylaxis:      Code Status: Full Code  Family Communication: None Disposition Plan:    Status is: Inpatient  Remains inpatient appropriate because:IV treatments appropriate due to intensity of illness or inability to take PO   Dispo: The patient is from: Home              Anticipated d/c is to: Home              Anticipated d/c date is: 3 days              Patient currently is not medically stable to d/c.           Subjective:   C/o cough and pleuritic chest pain but she feels slightly better compared to yesterday.  She feels short of breath with activity.  Objective:    Vitals:   02/10/20 0414 02/10/20 0500 02/10/20 0822 02/10/20 1208  BP: 113/72  108/70 (!) 105/54  Pulse: 81  96 80  Resp: 16  19 18   Temp: (!) 97.5 F (36.4 C)  98.6 F (37 C) 98.5 F (36.9 C)  TempSrc:   Oral   SpO2: 97%  98% 97%  Weight:  93.7 kg    Height:       No data found.   Intake/Output Summary (Last 24 hours) at 02/10/2020 1407 Last data filed at 02/10/2020 1045 Gross per 24 hour  Intake 418.97 ml  Output 500 ml  Net -81.03 ml   Filed Weights   02/07/20 1939 02/09/20 0500 02/10/20 0500  Weight: 90.7 kg 89.5 kg 93.7 kg    Exam:  GEN: NAD SKIN: Warm and dry EYES: No pallor or icterus ENT: MMM CV: RRR PULM: CTA B ABD: soft, obese, NT, +BS CNS: AAO x 3, non focal EXT: No edema or tenderness      Data Reviewed:   I have personally reviewed following labs and imaging studies:  Labs: Labs show the following:   Basic Metabolic Panel: Recent Labs  Lab 02/04/20 1345 02/07/20 1938 02/08/20 0734 02/09/20 0437 02/10/20 0529  NA 135 138 137 139 142  K 3.5 3.6 3.6 3.9 4.0  CL 99 103 103 106 107  CO2 24 25 25 25 27   GLUCOSE 128* 130* 125* 176* 178*  BUN 17  15 10 12 19   CREATININE 1.27* 0.93 0.84 0.69 0.71   CALCIUM 8.8* 7.8* 7.6* 7.7* 8.2*   GFR Estimated Creatinine Clearance: 101.8 mL/min (by C-G formula based on SCr of 0.71 mg/dL). Liver Function Tests: Recent Labs  Lab 02/07/20 1938 02/08/20 0734 02/09/20 0437 02/10/20 0529  AST 26 84* 44* 26  ALT 17 49* 37 30  ALKPHOS 60 209* 163* 141*  BILITOT 0.8 1.0 0.6 0.5  PROT 6.3* 5.7* 5.5* 5.7*  ALBUMIN 3.3* 3.1* 2.7* 2.9*   Recent Labs  Lab 02/07/20 2147  LIPASE 80*   No results for input(s): AMMONIA in the last 168 hours. Coagulation profile No results for input(s): INR, PROTIME in the last 168 hours.  CBC: Recent Labs  Lab 02/04/20 1345 02/07/20 1938 02/08/20 0734 02/09/20 0437 02/10/20 0529  WBC 5.1 3.6* 4.6 3.1* 6.8  NEUTROABS  --  2.8 3.7 2.4 5.8  HGB 14.4 12.6 12.4 11.0* 11.2*  HCT 41.1 36.8 35.8* 32.8* 32.8*  MCV 87.8 89.5 88.0 90.1 90.9  PLT 192 161 154 177 236   Cardiac Enzymes: No results for input(s): CKTOTAL, CKMB, CKMBINDEX, TROPONINI in the last 168 hours. BNP (last 3 results) No results for input(s): PROBNP in the last 8760 hours. CBG: No results for input(s): GLUCAP in the last 168 hours. D-Dimer: No results for input(s): DDIMER in the last 72 hours. Hgb A1c: No results for input(s): HGBA1C in the last 72 hours. Lipid Profile: No results for input(s): CHOL, HDL, LDLCALC, TRIG, CHOLHDL, LDLDIRECT in the last 72 hours. Thyroid function studies: No results for input(s): TSH, T4TOTAL, T3FREE, THYROIDAB in the last 72 hours.  Invalid input(s): FREET3 Anemia work up: Recent Labs    02/09/20 0437 02/10/20 0529  FERRITIN 388* 305   Sepsis Labs: Recent Labs  Lab 02/04/20 1345 02/07/20 1938 02/07/20 2147 02/08/20 0734 02/08/20 0938 02/09/20 0437 02/10/20 0529  PROCALCITON  --   --   --  1.56  --   --   --   WBC 5.1 3.6*  --  4.6  --  3.1* 6.8  LATICACIDVEN 1.3 1.6 3.2*  --  1.2  --   --     Microbiology Recent Results (from the past 240 hour(s))  Culture, blood (routine x 2)      Status: None (Preliminary result)   Collection Time: 02/07/20  7:38 PM   Specimen: BLOOD  Result Value Ref Range Status   Specimen Description BLOOD RIGHT ANTECUBITAL  Final   Special Requests   Final    BOTTLES DRAWN AEROBIC AND ANAEROBIC Blood Culture results may not be optimal due to an inadequate volume of blood received in culture bottles   Culture   Final    NO GROWTH 2 DAYS Performed at [facility], [address] [place], [place]    Report Status PENDING  Incomplete  Culture, blood (routine x 2)     Status: None (Preliminary result)   Collection Time: 02/07/20  7:38 PM   Specimen: BLOOD  Result Value Ref Range Status   Specimen Description BLOOD LEFT ANTECUBITAL  Final   Special Requests   Final    BOTTLES DRAWN AEROBIC AND ANAEROBIC Blood Culture adequate volume   Culture   Final    NO GROWTH 2 DAYS Performed at [facility], [address] [place], [place]    Report Status PENDING  Incomplete    Procedures and diagnostic studies:  NM Pulmonary Perfusion  Result Date: 02/08/2020 CLINICAL  DATA:  49 year old female with elevated D-dimer. Positive COVID-19. EXAM: NUCLEAR MEDICINE PERFUSION LUNG SCAN TECHNIQUE: Perfusion images were obtained in multiple projections after intravenous injection of radiopharmaceutical. Ventilation scans intentionally deferred if perfusion scan and chest x-ray adequate for interpretation during COVID 19 epidemic. RADIOPHARMACEUTICALS:  3.934 mCi Tc-67m MAA IV COMPARISON:  Chest radiograph dated 02/07/2020 and CT dated 02/07/2020 FINDINGS: Small areas of peripheral heterogeneity and defect primarily in the periphery and subpleural lung corresponds to the opacity seen on the chest radiograph. No large or segmental perfusion defect identified. IMPRESSION: Low probability for pulmonary embolism. Electronically Signed   By: Elgie Collard M.D.   On: 02/08/2020 17:44   US Venous Img Lower Bilateral  (DVT)  Result Date: 02/09/2020 CLINICAL DATA:  Bilateral lower extremity pain and edema. Elevated D-dimer. Evaluate for DVT. EXAM: BILATERAL LOWER EXTREMITY VENOUS DOPPLER ULTRASOUND TECHNIQUE: Gray-scale sonography with graded compression, as well as color Doppler and duplex ultrasound were performed to evaluate the lower extremity deep venous systems from the level of the common femoral vein and including the common femoral, femoral, profunda femoral, popliteal and calf veins including the posterior tibial, peroneal and gastrocnemius veins when visible. The superficial great saphenous vein was also interrogated. Spectral Doppler was utilized to evaluate flow at rest and with distal augmentation maneuvers in the common femoral, femoral and popliteal veins. COMPARISON:  None. FINDINGS: RIGHT LOWER EXTREMITY Common Femoral Vein: No evidence of thrombus. Normal compressibility, respiratory phasicity and response to augmentation. Saphenofemoral Junction: No evidence of thrombus. Normal compressibility and flow on color Doppler imaging. Profunda Femoral Vein: No evidence of thrombus. Normal compressibility and flow on color Doppler imaging. Femoral Vein: No evidence of thrombus. Normal compressibility, respiratory phasicity and response to augmentation. Popliteal Vein: No evidence of thrombus. Normal compressibility, respiratory phasicity and response to augmentation. Calf Veins: No evidence of thrombus. Normal compressibility and flow on color Doppler imaging. Superficial Great Saphenous Vein: No evidence of thrombus. Normal compressibility. Venous Reflux:  None. Other Findings:  None. LEFT LOWER EXTREMITY Common Femoral Vein: No evidence of thrombus. Normal compressibility, respiratory phasicity and response to augmentation. Saphenofemoral Junction: No evidence of thrombus. Normal compressibility and flow on color Doppler imaging. Profunda Femoral Vein: No evidence of thrombus. Normal compressibility and flow on  color Doppler imaging. Femoral Vein: No evidence of thrombus. Normal compressibility, respiratory phasicity and response to augmentation. Popliteal Vein: No evidence of thrombus. Normal compressibility, respiratory phasicity and response to augmentation. Calf Veins: No evidence of thrombus. Normal compressibility and flow on color Doppler imaging. Superficial Great Saphenous Vein: No evidence of thrombus. Normal compressibility. Venous Reflux:  None. Other Findings:  None. IMPRESSION: No evidence of DVT within either lower extremity. Electronically Signed   By: Simonne Come M.D.   On: 02/09/2020 14:09               LOS: 3 days   Deward Sebek  [facility]   Pager on [email or web address]. If 7PM-7AM, please contact night-coverage at [email or web address]     02/10/2020, 2:07 PM

## 2020-02-11 DIAGNOSIS — U071 COVID-19: Secondary | ICD-10-CM | POA: Diagnosis not present

## 2020-02-11 DIAGNOSIS — J1282 Pneumonia due to coronavirus disease 2019: Secondary | ICD-10-CM | POA: Diagnosis not present

## 2020-02-11 DIAGNOSIS — J9601 Acute respiratory failure with hypoxia: Secondary | ICD-10-CM | POA: Diagnosis not present

## 2020-02-11 LAB — COMPREHENSIVE METABOLIC PANEL
ALT: 29 U/L (ref 0–44)
AST: 30 U/L (ref 15–41)
Albumin: 3 g/dL — ABNORMAL LOW (ref 3.5–5.0)
Alkaline Phosphatase: 132 U/L — ABNORMAL HIGH (ref 38–126)
Anion gap: 7 (ref 5–15)
BUN: 25 mg/dL — ABNORMAL HIGH (ref 6–20)
CO2: 32 mmol/L (ref 22–32)
Calcium: 8.5 mg/dL — ABNORMAL LOW (ref 8.9–10.3)
Chloride: 105 mmol/L (ref 98–111)
Creatinine, Ser: 0.71 mg/dL (ref 0.44–1.00)
GFR, Estimated: >60 mL/min (ref >60)
Glucose, Bld: 105 mg/dL — ABNORMAL HIGH (ref 70–99)
Potassium: 3.5 mmol/L (ref 3.5–5.1)
Sodium: 144 mmol/L (ref 135–145)
Total Bilirubin: 0.7 mg/dL (ref 0.3–1.2)
Total Protein: 5.8 g/dL — ABNORMAL LOW (ref 6.5–8.1)

## 2020-02-11 LAB — FERRITIN: Ferritin: 251 ng/mL (ref 11–307)

## 2020-02-11 LAB — CBC WITH DIFFERENTIAL/PLATELET
Abs Immature Granulocytes: 0.11 10*3/uL — ABNORMAL HIGH (ref 0.00–0.07)
Basophils Absolute: 0 10*3/uL (ref 0.0–0.1)
Basophils Relative: 0 %
Eosinophils Absolute: 0 10*3/uL (ref 0.0–0.5)
Eosinophils Relative: 0 %
HCT: 34.6 % — ABNORMAL LOW (ref 36.0–46.0)
Hemoglobin: 11.8 g/dL — ABNORMAL LOW (ref 12.0–15.0)
Immature Granulocytes: 2 %
Lymphocytes Relative: 11 %
Lymphs Abs: 0.8 10*3/uL (ref 0.7–4.0)
MCH: 31.1 pg (ref 26.0–34.0)
MCHC: 34.1 g/dL (ref 30.0–36.0)
MCV: 91.3 fL (ref 80.0–100.0)
Monocytes Absolute: 0.4 10*3/uL (ref 0.1–1.0)
Monocytes Relative: 5 %
Neutro Abs: 6 10*3/uL (ref 1.7–7.7)
Neutrophils Relative %: 82 %
Platelets: 256 10*3/uL (ref 150–400)
RBC: 3.79 MIL/uL — ABNORMAL LOW (ref 3.87–5.11)
RDW: 13.4 % (ref 11.5–15.5)
WBC: 7.3 10*3/uL (ref 4.0–10.5)
nRBC: 0 % (ref 0.0–0.2)

## 2020-02-11 LAB — C-REACTIVE PROTEIN: CRP: 1.6 mg/dL — ABNORMAL HIGH (ref <1.0)

## 2020-02-11 LAB — FIBRIN DERIVATIVES D-DIMER (ARMC ONLY): Fibrin derivatives D-dimer (ARMC): 882.52 ng/mL (FEU) — ABNORMAL HIGH (ref 0.00–499.00)

## 2020-02-11 MED ORDER — ZOLPIDEM TARTRATE 5 MG PO TABS
5.0000 mg | ORAL_TABLET | Freq: Every day | ORAL | Status: DC
  Administered 2020-02-11 – 2020-02-17 (×7): 5 mg via ORAL
  Filled 2020-02-11 (×7): qty 1

## 2020-02-11 MED ORDER — TRAZODONE HCL 50 MG PO TABS
50.0000 mg | ORAL_TABLET | Freq: Every day | ORAL | Status: DC
  Administered 2020-02-11 – 2020-02-17 (×7): 50 mg via ORAL
  Filled 2020-02-11 (×7): qty 1

## 2020-02-11 NOTE — Progress Notes (Signed)
Progress Note    Alicia GuadeloupeDorothy Van Note Alicia [name]  RUE:[id number] DOB: 18-Oct-1970  DOA: 02/07/2020 PCP: [name], [name], MD      Brief Narrative:    Medical records reviewed and are as summarized below:  Alicia [name] is a [age] y.o. female with a known history of anxiety and depression, hypertension, dyslipidemia, irritable bowel syndrome, restless leg syndrome and sleep apnea on CPAP, presented to the emergency room with acute onset of worsening dyspnea with associateddry cough and wheezing.       Assessment/Plan:   Principal Problem:   Pneumonia due to COVID-19 virus Active Problems:   Acute hypoxemic respiratory failure (HCC)   Acute hypoxemic respiratory failure secondary to COVID-19 pneumonia:  Positive Covid test on 02/04/2020 at an urgent care center Continue 3 L/min oxygen via nasal cannula and taper off as able. Continue IV steroids and IV remdesivir infusion. No growth on blood tests which will cultures thus far.   No evidence of pulmonary embolism or DVT.  COVID-19 Labs  Recent Labs    02/09/20 0437 02/10/20 0529 02/11/20 0545  FERRITIN 388* 305 251  CRP 8.4* 2.9* 1.6*    Lab Results  Component Value Date   SARSCOV2NAA NEGATIVE 05/27/2019   SARSCOV2NAA NEGATIVE 01/18/2019   SARSCOV2NAA Not Detected 05/07/2018     Insomnia: Increase trazodone from 25 mg to 50 mg nightly for sleep.  Continue Ambien at night.  Elevated liver enzymes: Resolved.   Leukopenia: Improved.  This was likely related to COVID-19 infection.  Other comorbidities include hypertension, dyslipidemia, anxiety, depression, GERD, migraine.  Continue home medications.  Body mass index is 31.14 kg/m.  (Obesity)       Diet Order            Diet Heart Room service appropriate? Yes; Fluid consistency: Thin  Diet effective now                    Consultants:  None  Procedures:  None    Medications:   . ALPRAZolam  1 mg Oral BH-q7a  . vitamin C  500  mg Oral Daily  . aspirin EC  81 mg Oral Daily  . carvedilol  3.125 mg Oral BID WC  . cholecalciferol  1,000 Units Oral Daily  . cyclobenzaprine  10 mg Oral QPM  . enoxaparin (LOVENOX) injection  0.5 mg/kg Subcutaneous Q24H  . famotidine  20 mg Oral BID  . feeding supplement  237 mL Oral TID BM  . icosapent Ethyl  1 g Oral TID  . mouth rinse  15 mL Mouth Rinse BID  . multivitamin with minerals  1 tablet Oral Daily  . pantoprazole  40 mg Oral BH-q7a  . PARoxetine  60 mg Oral Daily  . predniSONE  50 mg Oral Daily  . rosuvastatin  40 mg Oral Daily  . traZODone  50 mg Oral QHS  . Vitamin D (Ergocalciferol)  50,000 Units Oral Q7 days  . zinc sulfate  220 mg Oral Daily  . zolpidem  5 mg Oral QHS   Continuous Infusions: . remdesivir 100 mg in NS 100 mL Stopped (02/10/20 0935)     Anti-infectives (From admission, onward)   Start     Dose/Rate Route Frequency Ordered Stop   02/08/20 1100  cefTRIAXone (ROCEPHIN) 2 g in sodium chloride 0.9 % 100 mL IVPB  Status:  Discontinued        2 g 200 mL/hr over 30 Minutes Intravenous Every 24 hours 02/08/20  1043 02/10/20 1407   02/08/20 1100  doxycycline (VIBRA-TABS) tablet 100 mg  Status:  Discontinued        100 mg Oral Every 12 hours 02/08/20 1043 02/10/20 1407   02/08/20 1000  remdesivir 100 mg in sodium chloride 0.9 % 100 mL IVPB       "Followed by" Linked Group Details   100 mg 200 mL/hr over 30 Minutes Intravenous Daily 02/07/20 2309 02/12/20 0959   02/08/20 1000  remdesivir 100 mg in sodium chloride 0.9 % 100 mL IVPB  Status:  Discontinued       "Followed by" Linked Group Details   100 mg 200 mL/hr over 30 Minutes Intravenous Daily 02/07/20 2335 02/07/20 2336   02/07/20 2345  remdesivir 200 mg in sodium chloride 0.9% 250 mL IVPB  Status:  Discontinued       "Followed by" Linked Group Details   200 mg 580 mL/hr over 30 Minutes Intravenous Once 02/07/20 2335 02/07/20 2336   02/07/20 2330  remdesivir 200 mg in sodium chloride 0.9% 250 mL  IVPB       "Followed by" Linked Group Details   200 mg 580 mL/hr over 30 Minutes Intravenous Once 02/07/20 2309 02/08/20 0443             Family Communication/Anticipated D/C date and plan/Code Status   DVT prophylaxis:      Code Status: Full Code  Family Communication: None Disposition Plan:    Status is: Inpatient  Remains inpatient appropriate because:IV treatments appropriate due to intensity of illness or inability to take PO   Dispo: The patient is from: Home              Anticipated d/c is to: Home              Anticipated d/c date is: 3 days              Patient currently is not medically stable to d/c.           Subjective:   C/o cough, pleuritic chest pain and shortness of breath that is worse with mild exertion.  She said she only had about 1 hour of sleep last night.  She feels weak and tired.  Objective:    Vitals:   02/11/20 0045 02/11/20 0500 02/11/20 0501 02/11/20 1201  BP: 119/76  (!) 133/106 111/65  Pulse: 66  71 78  Resp: 16  18   Temp: (!) 97.5 F (36.4 C)  97.9 F (36.6 C) 98.5 F (36.9 C)  TempSrc:    Oral  SpO2: 96%  95% 96%  Weight:  92.9 kg    Height:       No data found.   Intake/Output Summary (Last 24 hours) at 02/11/2020 1211 Last data filed at 02/11/2020 0047 Gross per 24 hour  Intake --  Output 200 ml  Net -200 ml   Filed Weights   02/09/20 0500 02/10/20 0500 02/11/20 0500  Weight: 89.5 kg 93.7 kg 92.9 kg    Exam:  GEN: NAD SKIN: Warm and dry EYES: No pallor or icterus ENT: MMM CV: RRR PULM: Bibasilar rales.  No wheezing heard. ABD: soft, obese, NT, +BS CNS: AAO x 3, non focal EXT: No edema or tenderness       Data Reviewed:   I have personally reviewed following labs and imaging studies:  Labs: Labs show the following:   Basic Metabolic Panel: Recent Labs  Lab 02/07/20 1938 02/08/20 0734 02/09/20 0437 02/10/20 0529  02/11/20 0545  NA 138 137 139 142 144  K 3.6 3.6 3.9 4.0  3.5  CL 103 103 106 107 105  CO2 25 25 25 27  32  GLUCOSE 130* 125* 176* 178* 105*  BUN 15 10 12 19  25*  CREATININE 0.93 0.84 0.69 0.71 0.71  CALCIUM 7.8* 7.6* 7.7* 8.2* 8.5*   GFR Estimated Creatinine Clearance: 101.4 mL/min (by C-G formula based on SCr of 0.71 mg/dL). Liver Function Tests: Recent Labs  Lab 02/07/20 1938 02/08/20 0734 02/09/20 0437 02/10/20 0529 02/11/20 0545  AST 26 84* 44* 26 30  ALT 17 49* 37 30 29  ALKPHOS 60 209* 163* 141* 132*  BILITOT 0.8 1.0 0.6 0.5 0.7  PROT 6.3* 5.7* 5.5* 5.7* 5.8*  ALBUMIN 3.3* 3.1* 2.7* 2.9* 3.0*   Recent Labs  Lab 02/07/20 2147  LIPASE 80*   No results for input(s): AMMONIA in the last 168 hours. Coagulation profile No results for input(s): INR, PROTIME in the last 168 hours.  CBC: Recent Labs  Lab 02/07/20 1938 02/08/20 0734 02/09/20 0437 02/10/20 0529 02/11/20 0545  WBC 3.6* 4.6 3.1* 6.8 7.3  NEUTROABS 2.8 3.7 2.4 5.8 6.0  HGB 12.6 12.4 11.0* 11.2* 11.8*  HCT 36.8 35.8* 32.8* 32.8* 34.6*  MCV 89.5 88.0 90.1 90.9 91.3  PLT 161 154 177 236 256   Cardiac Enzymes: No results for input(s): CKTOTAL, CKMB, CKMBINDEX, TROPONINI in the last 168 hours. BNP (last 3 results) No results for input(s): PROBNP in the last 8760 hours. CBG: No results for input(s): GLUCAP in the last 168 hours. D-Dimer: No results for input(s): DDIMER in the last 72 hours. Hgb A1c: No results for input(s): HGBA1C in the last 72 hours. Lipid Profile: No results for input(s): CHOL, HDL, LDLCALC, TRIG, CHOLHDL, LDLDIRECT in the last 72 hours. Thyroid function studies: No results for input(s): TSH, T4TOTAL, T3FREE, THYROIDAB in the last 72 hours.  Invalid input(s): FREET3 Anemia work up: Recent Labs    02/10/20 0529 02/11/20 0545  FERRITIN 305 251   Sepsis Labs: Recent Labs  Lab 02/04/20 1345 02/07/20 1938 02/07/20 2147 02/08/20 0734 02/08/20 0938 02/09/20 0437 02/10/20 0529 02/11/20 0545  PROCALCITON  --   --   --  1.56  --    --   --   --   WBC 5.1 3.6*  --  4.6  --  3.1* 6.8 7.3  LATICACIDVEN 1.3 1.6 3.2*  --  1.2  --   --   --     Microbiology Recent Results (from the past 240 hour(s))  Culture, blood (routine x 2)     Status: None (Preliminary result)   Collection Time: 02/07/20  7:38 PM   Specimen: BLOOD  Result Value Ref Range Status   Specimen Description BLOOD RIGHT ANTECUBITAL  Final   Special Requests   Final    BOTTLES DRAWN AEROBIC AND ANAEROBIC Blood Culture results may not be optimal due to an inadequate volume of blood received in culture bottles   Culture   Final    NO GROWTH 3 DAYS Performed at [facility], [address], [place], [address] [place]    Report Status PENDING  Incomplete  Culture, blood (routine x 2)     Status: None (Preliminary result)   Collection Time: 02/07/20  7:38 PM   Specimen: BLOOD  Result Value Ref Range Status   Specimen Description BLOOD LEFT ANTECUBITAL  Final   Special Requests   Final    BOTTLES DRAWN AEROBIC  AND ANAEROBIC Blood Culture adequate volume   Culture   Final    NO GROWTH 3 DAYS Performed at [facility], [address] [place], [place] [address]    Report Status PENDING  Incomplete    Procedures and diagnostic studies:  US Venous Img Lower Bilateral (DVT)  Result Date: 02/09/2020 CLINICAL DATA:  Bilateral lower extremity pain and edema. Elevated D-dimer. Evaluate for DVT. EXAM: BILATERAL LOWER EXTREMITY VENOUS DOPPLER ULTRASOUND TECHNIQUE: Gray-scale sonography with graded compression, as well as color Doppler and duplex ultrasound were performed to evaluate the lower extremity deep venous systems from the level of the common femoral vein and including the common femoral, femoral, profunda femoral, popliteal and calf veins including the posterior tibial, peroneal and gastrocnemius veins when visible. The superficial great saphenous vein was also interrogated. Spectral Doppler was utilized to evaluate flow at rest and  with distal augmentation maneuvers in the common femoral, femoral and popliteal veins. COMPARISON:  None. FINDINGS: RIGHT LOWER EXTREMITY Common Femoral Vein: No evidence of thrombus. Normal compressibility, respiratory phasicity and response to augmentation. Saphenofemoral Junction: No evidence of thrombus. Normal compressibility and flow on color Doppler imaging. Profunda Femoral Vein: No evidence of thrombus. Normal compressibility and flow on color Doppler imaging. Femoral Vein: No evidence of thrombus. Normal compressibility, respiratory phasicity and response to augmentation. Popliteal Vein: No evidence of thrombus. Normal compressibility, respiratory phasicity and response to augmentation. Calf Veins: No evidence of thrombus. Normal compressibility and flow on color Doppler imaging. Superficial Great Saphenous Vein: No evidence of thrombus. Normal compressibility. Venous Reflux:  None. Other Findings:  None. LEFT LOWER EXTREMITY Common Femoral Vein: No evidence of thrombus. Normal compressibility, respiratory phasicity and response to augmentation. Saphenofemoral Junction: No evidence of thrombus. Normal compressibility and flow on color Doppler imaging. Profunda Femoral Vein: No evidence of thrombus. Normal compressibility and flow on color Doppler imaging. Femoral Vein: No evidence of thrombus. Normal compressibility, respiratory phasicity and response to augmentation. Popliteal Vein: No evidence of thrombus. Normal compressibility, respiratory phasicity and response to augmentation. Calf Veins: No evidence of thrombus. Normal compressibility and flow on color Doppler imaging. Superficial Great Saphenous Vein: No evidence of thrombus. Normal compressibility. Venous Reflux:  None. Other Findings:  None. IMPRESSION: No evidence of DVT within either lower extremity. Electronically Signed   By: Simonne Come M.D.   On: 02/09/2020 14:09               LOS: 4 days   Jd Mccaster  [facility]    Pager on [email or web address]. If 7PM-7AM, please contact night-coverage at [email or web address]     02/11/2020, 12:11 PM

## 2020-02-12 DIAGNOSIS — J1282 Pneumonia due to coronavirus disease 2019: Secondary | ICD-10-CM | POA: Diagnosis not present

## 2020-02-12 DIAGNOSIS — J9601 Acute respiratory failure with hypoxia: Secondary | ICD-10-CM | POA: Diagnosis not present

## 2020-02-12 DIAGNOSIS — U071 COVID-19: Secondary | ICD-10-CM | POA: Diagnosis not present

## 2020-02-12 LAB — CBC WITH DIFFERENTIAL/PLATELET
Abs Immature Granulocytes: 0.09 10*3/uL — ABNORMAL HIGH (ref 0.00–0.07)
Basophils Absolute: 0 10*3/uL (ref 0.0–0.1)
Basophils Relative: 0 %
Eosinophils Absolute: 0 10*3/uL (ref 0.0–0.5)
Eosinophils Relative: 0 %
HCT: 32.9 % — ABNORMAL LOW (ref 36.0–46.0)
Hemoglobin: 11.3 g/dL — ABNORMAL LOW (ref 12.0–15.0)
Immature Granulocytes: 1 %
Lymphocytes Relative: 10 %
Lymphs Abs: 0.7 10*3/uL (ref 0.7–4.0)
MCH: 31.2 pg (ref 26.0–34.0)
MCHC: 34.3 g/dL (ref 30.0–36.0)
MCV: 90.9 fL (ref 80.0–100.0)
Monocytes Absolute: 0.3 10*3/uL (ref 0.1–1.0)
Monocytes Relative: 5 %
Neutro Abs: 5.3 10*3/uL (ref 1.7–7.7)
Neutrophils Relative %: 84 %
Platelets: 251 10*3/uL (ref 150–400)
RBC: 3.62 MIL/uL — ABNORMAL LOW (ref 3.87–5.11)
RDW: 13.1 % (ref 11.5–15.5)
WBC: 6.3 10*3/uL (ref 4.0–10.5)
nRBC: 0 % (ref 0.0–0.2)

## 2020-02-12 LAB — COMPREHENSIVE METABOLIC PANEL
ALT: 30 U/L (ref 0–44)
AST: 30 U/L (ref 15–41)
Albumin: 2.9 g/dL — ABNORMAL LOW (ref 3.5–5.0)
Alkaline Phosphatase: 119 U/L (ref 38–126)
Anion gap: 8 (ref 5–15)
BUN: 21 mg/dL — ABNORMAL HIGH (ref 6–20)
CO2: 33 mmol/L — ABNORMAL HIGH (ref 22–32)
Calcium: 8 mg/dL — ABNORMAL LOW (ref 8.9–10.3)
Chloride: 101 mmol/L (ref 98–111)
Creatinine, Ser: 0.7 mg/dL (ref 0.44–1.00)
GFR, Estimated: >60 mL/min (ref >60)
Glucose, Bld: 126 mg/dL — ABNORMAL HIGH (ref 70–99)
Potassium: 3.7 mmol/L (ref 3.5–5.1)
Sodium: 142 mmol/L (ref 135–145)
Total Bilirubin: 0.7 mg/dL (ref 0.3–1.2)
Total Protein: 5.7 g/dL — ABNORMAL LOW (ref 6.5–8.1)

## 2020-02-12 LAB — FIBRIN DERIVATIVES D-DIMER (ARMC ONLY): Fibrin derivatives D-dimer (ARMC): 933.58 ng/mL (FEU) — ABNORMAL HIGH (ref 0.00–499.00)

## 2020-02-12 LAB — FERRITIN: Ferritin: 237 ng/mL (ref 11–307)

## 2020-02-12 LAB — C-REACTIVE PROTEIN: CRP: 7.1 mg/dL — ABNORMAL HIGH (ref <1.0)

## 2020-02-12 MED ORDER — OXYMETAZOLINE HCL 0.05 % NA SOLN: 1.0000 | Freq: Two times a day (BID) | NASAL | Status: DC

## 2020-02-12 MED ORDER — OXYMETAZOLINE HCL 0.05 % NA SOLN
1.0000 | Freq: Two times a day (BID) | NASAL | Status: AC
  Administered 2020-02-12 – 2020-02-14 (×6): 1 via NASAL
  Filled 2020-02-12: qty 15

## 2020-02-12 NOTE — Progress Notes (Signed)
Dr Myriam Forehand made aware that pt is having a right nostril nose bleed x15 mins

## 2020-02-12 NOTE — Progress Notes (Signed)
Message to MD not seen, paged Dr Myriam Forehand regarding nose bleed, new order placed

## 2020-02-12 NOTE — Progress Notes (Addendum)
Progress Note    Alicia [name]  KDX:[id number] DOB: 12-14-1970  DOA: 02/07/2020 PCP: Alicia Cory, MD      Brief Narrative:    Medical records reviewed and are as summarized below:  Alicia [name] is a 49 y.o. female with a known history of anxiety and depression, hypertension, dyslipidemia, irritable bowel syndrome, restless leg syndrome and sleep apnea on CPAP, presented to the emergency room with acute onset of worsening dyspnea with associateddry cough and wheezing.       Assessment/Plan:   Principal Problem:   Pneumonia due to COVID-19 virus Active Problems:   Acute hypoxemic respiratory failure (HCC)   Acute hypoxemic respiratory failure secondary to COVID-19 pneumonia:  Positive Covid test on 02/04/2020 at an urgent care center She is requiring between 3 to 4 L/min oxygen via nasal cannula.  Taper off oxygen as able. Continue IV remdesivir and IV steroids. No growth on blood tests which will cultures thus far.   No evidence of pulmonary embolism or DVT.  COVID-19 Labs  Recent Labs    02/10/20 0529 02/11/20 0545 02/12/20 0503  FERRITIN 305 251 237  CRP 2.9* 1.6* 7.1*    Lab Results  Component Value Date   SARSCOV2NAA NEGATIVE 05/27/2019   SARSCOV2NAA NEGATIVE 01/18/2019   SARSCOV2NAA Not Detected 05/07/2018     Epistaxis (right-sided): This is likely due to dry nose from oxygen.  Afrin nasal spray has been ordered.  Insomnia: Continue trazodone and Ambien as needed for sleep at night  Nausea: Treat with Zofran as needed.  Elevated liver enzymes: Resolved.   Leukopenia: Improved.  This was likely related to COVID-19 infection.  Other comorbidities include hypertension, dyslipidemia, anxiety, depression, GERD, migraine.  Continue home medications.  Body mass index is 30.59 kg/m.  (Obesity)       Diet Order            Diet Heart Room service appropriate? Yes; Fluid consistency: Thin  Diet effective now                     Consultants:  None  Procedures:  None    Medications:   . ALPRAZolam  1 mg Oral BH-q7a  . vitamin C  500 mg Oral Daily  . aspirin EC  81 mg Oral Daily  . carvedilol  3.125 mg Oral BID WC  . cholecalciferol  1,000 Units Oral Daily  . cyclobenzaprine  10 mg Oral QPM  . enoxaparin (LOVENOX) injection  0.5 mg/kg Subcutaneous Q24H  . famotidine  20 mg Oral BID  . feeding supplement  237 mL Oral TID BM  . icosapent Ethyl  1 g Oral TID  . mouth rinse  15 mL Mouth Rinse BID  . multivitamin with minerals  1 tablet Oral Daily  . oxymetazoline  1 spray Each Nare BID  . pantoprazole  40 mg Oral BH-q7a  . PARoxetine  60 mg Oral Daily  . predniSONE  50 mg Oral Daily  . rosuvastatin  40 mg Oral Daily  . traZODone  50 mg Oral QHS  . Vitamin D (Ergocalciferol)  50,000 Units Oral Q7 days  . zinc sulfate  220 mg Oral Daily  . zolpidem  5 mg Oral QHS   Continuous Infusions:    Anti-infectives (From admission, onward)   Start     Dose/Rate Route Frequency Ordered Stop   02/08/20 1100  cefTRIAXone (ROCEPHIN) 2 g in sodium chloride 0.9 % 100 mL IVPB  Status:  Discontinued        2 g 200 mL/hr over 30 Minutes Intravenous Every 24 hours 02/08/20 1043 02/10/20 1407   02/08/20 1100  doxycycline (VIBRA-TABS) tablet 100 mg  Status:  Discontinued        100 mg Oral Every 12 hours 02/08/20 1043 02/10/20 1407   02/08/20 1000  remdesivir 100 mg in sodium chloride 0.9 % 100 mL IVPB       "Followed by" Linked Group Details   100 mg 200 mL/hr over 30 Minutes Intravenous Daily 02/07/20 2309 02/11/20 1433   02/08/20 1000  remdesivir 100 mg in sodium chloride 0.9 % 100 mL IVPB  Status:  Discontinued       "Followed by" Linked Group Details   100 mg 200 mL/hr over 30 Minutes Intravenous Daily 02/07/20 2335 02/07/20 2336   02/07/20 2345  remdesivir 200 mg in sodium chloride 0.9% 250 mL IVPB  Status:  Discontinued       "Followed by" Linked Group Details   200 mg 580 mL/hr over 30  Minutes Intravenous Once 02/07/20 2335 02/07/20 2336   02/07/20 2330  remdesivir 200 mg in sodium chloride 0.9% 250 mL IVPB       "Followed by" Linked Group Details   200 mg 580 mL/hr over 30 Minutes Intravenous Once 02/07/20 2309 02/08/20 0443             Family Communication/Anticipated D/C date and plan/Code Status   DVT prophylaxis:      Code Status: Full Code  Family Communication: None Disposition Plan:    Status is: Inpatient  Remains inpatient appropriate because:IV treatments appropriate due to intensity of illness or inability to take PO   Dispo: The patient is from: Home              Anticipated d/c is to: Home              Anticipated d/c date is: 3 days              Patient currently is not medically stable to d/c.           Subjective:   C/o nausea.  Her nurse also reported that she had some nosebleed this morning.  Objective:    Vitals:   02/12/20 0121 02/12/20 0601 02/12/20 0753 02/12/20 1009  BP: 115/81 125/79 (!) 107/57   Pulse: 64 76 63   Resp: 16 17 16 14   Temp: 98.1 F (36.7 C) 98.1 F (36.7 C) 98 F (36.7 C)   TempSrc: Oral Oral    SpO2: 99% 99% 95% 92%  Weight:  91.3 kg    Height:       No data found.   Intake/Output Summary (Last 24 hours) at 02/12/2020 1215 Last data filed at 02/11/2020 1553 Gross per 24 hour  Intake 107.81 ml  Output --  Net 107.81 ml   Filed Weights   02/10/20 0500 02/11/20 0500 02/12/20 0601  Weight: 93.7 kg 92.9 kg 91.3 kg    Exam:   GEN: NAD SKIN: Warm and dry EYES: No pallor or icterus ENT: MMM.  No active bleeding from the nose. CV: RRR PULM: Bibasilar rales, no wheezing heard ABD: soft, obese, NT, +BS CNS: AAO x 3, non focal EXT: No edema or tenderness           Data Reviewed:   I have personally reviewed following labs and imaging studies:  Labs: Labs show the following:   Basic Metabolic Panel:  Recent Labs  Lab 02/08/20 0734 02/09/20 0437 02/10/20 0529  02/11/20 0545 02/12/20 0503  NA 137 139 142 144 142  K 3.6 3.9 4.0 3.5 3.7  CL 103 106 107 105 101  CO2 25 25 27  32 33*  GLUCOSE 125* 176* 178* 105* 126*  BUN 10 12 19  25* 21*  CREATININE 0.84 0.69 0.71 0.71 0.70  CALCIUM 7.6* 7.7* 8.2* 8.5* 8.0*   GFR Estimated Creatinine Clearance: 100.6 mL/min (by C-G formula based on SCr of 0.7 mg/dL). Liver Function Tests: Recent Labs  Lab 02/08/20 0734 02/09/20 0437 02/10/20 0529 02/11/20 0545 02/12/20 0503  AST 84* 44* 26 30 30   ALT 49* 37 30 29 30   ALKPHOS 209* 163* 141* 132* 119  BILITOT 1.0 0.6 0.5 0.7 0.7  PROT 5.7* 5.5* 5.7* 5.8* 5.7*  ALBUMIN 3.1* 2.7* 2.9* 3.0* 2.9*   Recent Labs  Lab 02/07/20 2147  LIPASE 80*   No results for input(s): AMMONIA in the last 168 hours. Coagulation profile No results for input(s): INR, PROTIME in the last 168 hours.  CBC: Recent Labs  Lab 02/08/20 0734 02/09/20 0437 02/10/20 0529 02/11/20 0545 02/12/20 0503  WBC 4.6 3.1* 6.8 7.3 6.3  NEUTROABS 3.7 2.4 5.8 6.0 5.3  HGB 12.4 11.0* 11.2* 11.8* 11.3*  HCT 35.8* 32.8* 32.8* 34.6* 32.9*  MCV 88.0 90.1 90.9 91.3 90.9  PLT 154 177 236 256 251   Cardiac Enzymes: No results for input(s): CKTOTAL, CKMB, CKMBINDEX, TROPONINI in the last 168 hours. BNP (last 3 results) No results for input(s): PROBNP in the last 8760 hours. CBG: No results for input(s): GLUCAP in the last 168 hours. D-Dimer: No results for input(s): DDIMER in the last 72 hours. Hgb A1c: No results for input(s): HGBA1C in the last 72 hours. Lipid Profile: No results for input(s): CHOL, HDL, LDLCALC, TRIG, CHOLHDL, LDLDIRECT in the last 72 hours. Thyroid function studies: No results for input(s): TSH, T4TOTAL, T3FREE, THYROIDAB in the last 72 hours.  Invalid input(s): FREET3 Anemia work up: Recent Labs    02/11/20 0545 02/12/20 0503  FERRITIN 251 237   Sepsis Labs: Recent Labs  Lab 02/07/20 1938 02/07/20 2147 02/08/20 0734 02/08/20 0938 02/09/20 0437  02/10/20 0529 02/11/20 0545 02/12/20 0503  PROCALCITON  --   --  1.56  --   --   --   --   --   WBC 3.6*  --  4.6  --  3.1* 6.8 7.3 6.3  LATICACIDVEN 1.6 3.2*  --  1.2  --   --   --   --     Microbiology Recent Results (from the past 240 hour(s))  Culture, blood (routine x 2)     Status: None (Preliminary result)   Collection Time: 02/07/20  7:38 PM   Specimen: BLOOD  Result Value Ref Range Status   Specimen Description BLOOD RIGHT ANTECUBITAL  Final   Special Requests   Final    BOTTLES DRAWN AEROBIC AND ANAEROBIC Blood Culture results may not be optimal due to an inadequate volume of blood received in culture bottles   Culture   Final    NO GROWTH 4 DAYS Performed at [facility], [address] [place], 02/09/20 [facility]    Report Status PENDING  Incomplete  Culture, blood (routine x 2)     Status: None (Preliminary result)   Collection Time: 02/07/20  7:38 PM   Specimen: BLOOD  Result Value Ref Range Status   Specimen Description BLOOD LEFT ANTECUBITAL  Final   Special Requests   Final    BOTTLES DRAWN AEROBIC AND ANAEROBIC Blood Culture adequate volume   Culture   Final    NO GROWTH 4 DAYS Performed at [facility], [address] [place], [place] [address]    Report Status PENDING  Incomplete    Procedures and diagnostic studies:  No results found.             LOS: 5 days   Kailynne Ferrington  [facility]   Pager on [email or web address]. If 7PM-7AM, please contact night-coverage at [email or web address]     02/12/2020, 12:15 PM

## 2020-02-13 DIAGNOSIS — U071 COVID-19: Secondary | ICD-10-CM | POA: Diagnosis not present

## 2020-02-13 DIAGNOSIS — J9601 Acute respiratory failure with hypoxia: Secondary | ICD-10-CM | POA: Diagnosis not present

## 2020-02-13 DIAGNOSIS — J1282 Pneumonia due to coronavirus disease 2019: Secondary | ICD-10-CM | POA: Diagnosis not present

## 2020-02-13 LAB — CULTURE, BLOOD (ROUTINE X 2)
Culture: NO GROWTH
Culture: NO GROWTH
Special Requests: ADEQUATE

## 2020-02-13 LAB — CUP PACEART REMOTE DEVICE CHECK
Date Time Interrogation Session: 20211224233710
Implantable Pulse Generator Implant Date: 20181103

## 2020-02-13 MED ORDER — OXYCODONE HCL 5 MG PO TABS
5.0000 mg | ORAL_TABLET | Freq: Four times a day (QID) | ORAL | Status: DC | PRN
  Administered 2020-02-13 – 2020-02-17 (×7): 5 mg via ORAL
  Filled 2020-02-13 (×7): qty 1

## 2020-02-13 MED ORDER — BARICITINIB 2 MG PO TABS
4.0000 mg | ORAL_TABLET | Freq: Every day | ORAL | Status: DC
  Administered 2020-02-13 – 2020-02-18 (×6): 4 mg via ORAL
  Filled 2020-02-13 (×6): qty 2

## 2020-02-13 NOTE — Progress Notes (Signed)
Progress Note    Alicia [name]  ESP:[id number] DOB: 1970/08/05  DOA: 02/07/2020 PCP: Alba Cory, MD      Brief Narrative:    Medical records reviewed and are as summarized below:  Alicia [name] is a 49 y.o. female with a known history of anxiety and depression, hypertension, dyslipidemia, irritable bowel syndrome, restless leg syndrome and sleep apnea on CPAP, presented to the emergency room with acute onset of worsening dyspnea with associateddry cough and wheezing.       Assessment/Plan:   Principal Problem:   Pneumonia due to COVID-19 virus Active Problems:   Acute hypoxemic respiratory failure (HCC)   Acute hypoxemic respiratory failure secondary to COVID-19 pneumonia:  Positive Covid test on 02/04/2020 at an urgent care center Oxygen therapy has been escalated to 6 L/min per nasal cannula. Patient was offered baricitinib.  Discussed risks and benefits of treatment and she agreed to proceed with treatment because of worsening hypoxia. Continue prednisone. Completed IV remdesivir on 02/11/2020 No growth on blood cultures No evidence of pulmonary embolism or DVT.  COVID-19 Labs  Recent Labs    02/11/20 0545 02/12/20 0503  FERRITIN 251 237  CRP 1.6* 7.1*    Lab Results  Component Value Date   SARSCOV2NAA NEGATIVE 05/27/2019   SARSCOV2NAA NEGATIVE 01/18/2019   SARSCOV2NAA Not Detected 05/07/2018     Epistaxis (right-sided): Resolved.  Discontinue Afrin nasal spray after last dose today.    Insomnia: Continue trazodone and Ambien as needed for sleep at night  Nausea: Antiemetics as needed  Elevated liver enzymes: Resolved.   Leukopenia: Improved.  This was likely related to COVID-19 infection.  BP is soft.  Discontinue carvedilol if BP remains low.  Other comorbidities include hypertension, dyslipidemia, anxiety, depression, GERD, migraine.  Continue home medications.  Body mass index is 30.38 kg/m.   (Obesity)       Diet Order            Diet Heart Room service appropriate? Yes; Fluid consistency: Thin  Diet effective now                    Consultants:  None  Procedures:  None    Medications:   . ALPRAZolam  1 mg Oral BH-q7a  . vitamin C  500 mg Oral Daily  . aspirin EC  81 mg Oral Daily  . baricitinib  4 mg Oral Daily  . carvedilol  3.125 mg Oral BID WC  . cholecalciferol  1,000 Units Oral Daily  . cyclobenzaprine  10 mg Oral QPM  . enoxaparin (LOVENOX) injection  0.5 mg/kg Subcutaneous Q24H  . famotidine  20 mg Oral BID  . feeding supplement  237 mL Oral TID BM  . icosapent Ethyl  1 g Oral TID  . mouth rinse  15 mL Mouth Rinse BID  . multivitamin with minerals  1 tablet Oral Daily  . oxymetazoline  1 spray Each Nare BID  . pantoprazole  40 mg Oral BH-q7a  . PARoxetine  60 mg Oral Daily  . predniSONE  50 mg Oral Daily  . rosuvastatin  40 mg Oral Daily  . traZODone  50 mg Oral QHS  . Vitamin D (Ergocalciferol)  50,000 Units Oral Q7 days  . zinc sulfate  220 mg Oral Daily  . zolpidem  5 mg Oral QHS   Continuous Infusions:    Anti-infectives (From admission, onward)   Start     Dose/Rate Route Frequency Ordered Stop  02/08/20 1100  cefTRIAXone (ROCEPHIN) 2 g in sodium chloride 0.9 % 100 mL IVPB  Status:  Discontinued        2 g 200 mL/hr over 30 Minutes Intravenous Every 24 hours 02/08/20 1043 02/10/20 1407   02/08/20 1100  doxycycline (VIBRA-TABS) tablet 100 mg  Status:  Discontinued        100 mg Oral Every 12 hours 02/08/20 1043 02/10/20 1407   02/08/20 1000  remdesivir 100 mg in sodium chloride 0.9 % 100 mL IVPB       "Followed by" Linked Group Details   100 mg 200 mL/hr over 30 Minutes Intravenous Daily 02/07/20 2309 02/11/20 1433   02/08/20 1000  remdesivir 100 mg in sodium chloride 0.9 % 100 mL IVPB  Status:  Discontinued       "Followed by" Linked Group Details   100 mg 200 mL/hr over 30 Minutes Intravenous Daily 02/07/20 2335  02/07/20 2336   02/07/20 2345  remdesivir 200 mg in sodium chloride 0.9% 250 mL IVPB  Status:  Discontinued       "Followed by" Linked Group Details   200 mg 580 mL/hr over 30 Minutes Intravenous Once 02/07/20 2335 02/07/20 2336   02/07/20 2330  remdesivir 200 mg in sodium chloride 0.9% 250 mL IVPB       "Followed by" Linked Group Details   200 mg 580 mL/hr over 30 Minutes Intravenous Once 02/07/20 2309 02/08/20 0443             Family Communication/Anticipated D/C date and plan/Code Status   DVT prophylaxis:      Code Status: Full Code  Family Communication: None Disposition Plan:    Status is: Inpatient  Remains inpatient appropriate because:IV treatments appropriate due to intensity of illness or inability to take PO   Dispo: The patient is from: Home              Anticipated d/c is to: Home              Anticipated d/c date is: 3 days              Patient currently is not medically stable to d/c.           Subjective:   C/o cough, shortness of breath and pleuritic chest pain.  Nausea is better.  No nosebleed today.  Objective:    Vitals:   02/13/20 0330 02/13/20 0806 02/13/20 0938 02/13/20 0940  BP: 130/70  (!) 98/51 (!) 98/51  Pulse: 84  79 79  Resp:  (!) 21 16 16   Temp: 97.7 F (36.5 C)  98.6 F (37 C) 98.6 F (37 C)  TempSrc:      SpO2: 91% 93% 93% 95%  Weight: 90.6 kg     Height:       No data found.  No intake or output data in the 24 hours ending 02/13/20 1149 Filed Weights   02/11/20 0500 02/12/20 0601 02/13/20 0330  Weight: 92.9 kg 91.3 kg 90.6 kg    Exam:   GEN: NAD SKIN: Warm and dry EYES: No pallor or icterus ENT: MMM CV: RRR PULM: Bibasilar rales.  No wheezing ABD: soft, obese, NT, +BS CNS: AAO x 3, non focal EXT: No edema or tenderness            Data Reviewed:   I have personally reviewed following labs and imaging studies:  Labs: Labs show the following:   Basic Metabolic Panel: Recent Labs   Lab  02/08/20 0734 02/09/20 0437 02/10/20 0529 02/11/20 0545 02/12/20 0503  NA 137 139 142 144 142  K 3.6 3.9 4.0 3.5 3.7  CL 103 106 107 105 101  CO2 25 25 27  32 33*  GLUCOSE 125* 176* 178* 105* 126*  BUN 10 12 19  25* 21*  CREATININE 0.84 0.69 0.71 0.71 0.70  CALCIUM 7.6* 7.7* 8.2* 8.5* 8.0*   GFR Estimated Creatinine Clearance: 100.2 mL/min (by C-G formula based on SCr of 0.7 mg/dL). Liver Function Tests: Recent Labs  Lab 02/08/20 0734 02/09/20 0437 02/10/20 0529 02/11/20 0545 02/12/20 0503  AST 84* 44* 26 30 30   ALT 49* 37 30 29 30   ALKPHOS 209* 163* 141* 132* 119  BILITOT 1.0 0.6 0.5 0.7 0.7  PROT 5.7* 5.5* 5.7* 5.8* 5.7*  ALBUMIN 3.1* 2.7* 2.9* 3.0* 2.9*   Recent Labs  Lab 02/07/20 2147  LIPASE 80*   No results for input(s): AMMONIA in the last 168 hours. Coagulation profile No results for input(s): INR, PROTIME in the last 168 hours.  CBC: Recent Labs  Lab 02/08/20 0734 02/09/20 0437 02/10/20 0529 02/11/20 0545 02/12/20 0503  WBC 4.6 3.1* 6.8 7.3 6.3  NEUTROABS 3.7 2.4 5.8 6.0 5.3  HGB 12.4 11.0* 11.2* 11.8* 11.3*  HCT 35.8* 32.8* 32.8* 34.6* 32.9*  MCV 88.0 90.1 90.9 91.3 90.9  PLT 154 177 236 256 251   Cardiac Enzymes: No results for input(s): CKTOTAL, CKMB, CKMBINDEX, TROPONINI in the last 168 hours. BNP (last 3 results) No results for input(s): PROBNP in the last 8760 hours. CBG: No results for input(s): GLUCAP in the last 168 hours. D-Dimer: No results for input(s): DDIMER in the last 72 hours. Hgb A1c: No results for input(s): HGBA1C in the last 72 hours. Lipid Profile: No results for input(s): CHOL, HDL, LDLCALC, TRIG, CHOLHDL, LDLDIRECT in the last 72 hours. Thyroid function studies: No results for input(s): TSH, T4TOTAL, T3FREE, THYROIDAB in the last 72 hours.  Invalid input(s): FREET3 Anemia work up: Recent Labs    02/11/20 0545 02/12/20 0503  FERRITIN 251 237   Sepsis Labs: Recent Labs  Lab 02/07/20 1938  02/07/20 2147 02/08/20 0734 02/08/20 0938 02/09/20 0437 02/10/20 0529 02/11/20 0545 02/12/20 0503  PROCALCITON  --   --  1.56  --   --   --   --   --   WBC 3.6*  --  4.6  --  3.1* 6.8 7.3 6.3  LATICACIDVEN 1.6 3.2*  --  1.2  --   --   --   --     Microbiology Recent Results (from the past 240 hour(s))  Culture, blood (routine x 2)     Status: None   Collection Time: 02/07/20  7:38 PM   Specimen: BLOOD  Result Value Ref Range Status   Specimen Description BLOOD RIGHT ANTECUBITAL  Final   Special Requests   Final    BOTTLES DRAWN AEROBIC AND ANAEROBIC Blood Culture results may not be optimal due to an inadequate volume of blood received in culture bottles   Culture   Final    NO GROWTH 5 DAYS Performed at [facility], [address], [place], 02/09/20 [facility]    Report Status 02/13/2020 FINAL  Final  Culture, blood (routine x 2)     Status: None   Collection Time: 02/07/20  7:38 PM   Specimen: BLOOD  Result Value Ref Range Status   Specimen Description BLOOD LEFT ANTECUBITAL  Final   Special Requests   Final  BOTTLES DRAWN AEROBIC AND ANAEROBIC Blood Culture adequate volume   Culture   Final    NO GROWTH 5 DAYS Performed at [facility], [address] [place], [place] [address]    Report Status 02/13/2020 FINAL  Final    Procedures and diagnostic studies:  No results found.             LOS: 6 days   Keevan Wolz  [facility]   Pager on [email or web address]. If 7PM-7AM, please contact night-coverage at [email or web address]     02/13/2020, 11:49 AM

## 2020-02-13 NOTE — Progress Notes (Signed)
Patient oxygen saturation decreased to 80% on 4L [place]. Patient became very anxious at this time also.  Oxygen increased  to 6L [place], saturation improved. Complained of some chest pain, hospitalist notified, PRN morphine given with improvement. Patient resting with no other complaints at this time, will continue to monitor.

## 2020-02-14 ENCOUNTER — Ambulatory Visit (INDEPENDENT_AMBULATORY_CARE_PROVIDER_SITE_OTHER): Payer: BC Managed Care – PPO

## 2020-02-14 DIAGNOSIS — I639 Cerebral infarction, unspecified: Secondary | ICD-10-CM

## 2020-02-14 DIAGNOSIS — J1282 Pneumonia due to coronavirus disease 2019: Secondary | ICD-10-CM | POA: Diagnosis not present

## 2020-02-14 DIAGNOSIS — J9601 Acute respiratory failure with hypoxia: Secondary | ICD-10-CM | POA: Diagnosis not present

## 2020-02-14 DIAGNOSIS — U071 COVID-19: Secondary | ICD-10-CM | POA: Diagnosis not present

## 2020-02-14 LAB — GLUCOSE, CAPILLARY: Glucose-Capillary: 147 mg/dL — ABNORMAL HIGH (ref 70–99)

## 2020-02-14 MED ORDER — POLYETHYLENE GLYCOL 3350 17 G PO PACK
17.0000 g | PACK | Freq: Every day | ORAL | Status: DC | PRN
  Administered 2020-02-17: 08:00:00 17 g via ORAL
  Filled 2020-02-14 (×2): qty 1

## 2020-02-14 MED ORDER — LACTATED RINGERS IV SOLN: INTRAVENOUS | Status: AC

## 2020-02-14 MED ORDER — DOCUSATE SODIUM 100 MG PO CAPS
100.0000 mg | ORAL_CAPSULE | Freq: Two times a day (BID) | ORAL | Status: DC
  Administered 2020-02-14 – 2020-02-18 (×9): 100 mg via ORAL
  Filled 2020-02-14 (×9): qty 1

## 2020-02-14 NOTE — Progress Notes (Addendum)
Progress Note    Alicia [name] Note Postel  WUX:[id number] DOB: [birth year]  DOA: 02/07/2020 PCP: Alba Cory, MD      Brief Narrative:    Medical records reviewed and are as summarized below:  Alicia [name] is a 49 y.o. female with a known history of anxiety and depression, hypertension, dyslipidemia, irritable bowel syndrome, restless leg syndrome, history of nonepileptic seizures, history of stroke (loop recorder in place), history of leukocytoclastic vasculitis, rosacea and sleep apnea on CPAP.  She presented to the emergency room with acute onset of worsening dyspnea with associateddry cough and wheezing.       Assessment/Plan:   Principal Problem:   Pneumonia due to COVID-19 virus Active Problems:   Acute hypoxemic respiratory failure (HCC)   Acute hypoxemic respiratory failure secondary to COVID-19 pneumonia:  Positive Covid test on 02/04/2020 at an urgent care center She is on 5 L/min oxygen via nasal cannula.  Oxygen level drops into the upper 80s with any attempt to wean down oxygen from 5 L/min. Continue prednisone and baricitinib. Completed IV remdesivir on 02/11/2020 No growth on blood cultures No evidence of pulmonary embolism or DVT.  COVID-19 Labs  Recent Labs    02/12/20 0503  FERRITIN 237  CRP 7.1*    Lab Results  Component Value Date   SARSCOV2NAA NEGATIVE 05/27/2019   SARSCOV2NAA NEGATIVE 01/18/2019   SARSCOV2NAA Not Detected 05/07/2018     Epistaxis (right-sided): Resolved.  Discontinue Afrin nasal spray after last dose today.    Insomnia: Continue trazodone and Ambien as needed for sleep at night  Nausea: Antiemetics as needed  Hypotension: Discontinue carvedilol and start low rate IV fluids because of poor oral intake.  Leukopenia and elevated liver enzymes: Resolved  History of cryptogenic stroke: She has had a loop recorder since she had a stroke in November 2018   Other comorbidities include hypertension,  dyslipidemia, anxiety, depression, GERD, migraine, history of nonepileptic seizures, history of leukocytoclastic vasculitis, OSA.  Continue home medications.  Body mass index is 29.8 kg/m.  (Obesity)       Diet Order            Diet Heart Room service appropriate? Yes; Fluid consistency: Thin  Diet effective now                    Consultants:  None  Procedures:  None    Medications:   . ALPRAZolam  1 mg Oral BH-q7a  . vitamin C  500 mg Oral Daily  . aspirin EC  81 mg Oral Daily  . baricitinib  4 mg Oral Daily  . cholecalciferol  1,000 Units Oral Daily  . cyclobenzaprine  10 mg Oral QPM  . docusate sodium  100 mg Oral BID  . enoxaparin (LOVENOX) injection  0.5 mg/kg Subcutaneous Q24H  . famotidine  20 mg Oral BID  . feeding supplement  237 mL Oral TID BM  . icosapent Ethyl  1 g Oral TID  . mouth rinse  15 mL Mouth Rinse BID  . multivitamin with minerals  1 tablet Oral Daily  . oxymetazoline  1 spray Each Nare BID  . pantoprazole  40 mg Oral BH-q7a  . PARoxetine  60 mg Oral Daily  . predniSONE  50 mg Oral Daily  . rosuvastatin  40 mg Oral Daily  . traZODone  50 mg Oral QHS  . Vitamin D (Ergocalciferol)  50,000 Units Oral Q7 days  . zinc sulfate  220  mg Oral Daily  . zolpidem  5 mg Oral QHS   Continuous Infusions: . lactated ringers       Anti-infectives (From admission, onward)   Start     Dose/Rate Route Frequency Ordered Stop   02/08/20 1100  cefTRIAXone (ROCEPHIN) 2 g in sodium chloride 0.9 % 100 mL IVPB  Status:  Discontinued        2 g 200 mL/hr over 30 Minutes Intravenous Every 24 hours 02/08/20 1043 02/10/20 1407   02/08/20 1100  doxycycline (VIBRA-TABS) tablet 100 mg  Status:  Discontinued        100 mg Oral Every 12 hours 02/08/20 1043 02/10/20 1407   02/08/20 1000  remdesivir 100 mg in sodium chloride 0.9 % 100 mL IVPB       "Followed by" Linked Group Details   100 mg 200 mL/hr over 30 Minutes Intravenous Daily 02/07/20 2309 02/11/20  1433   02/08/20 1000  remdesivir 100 mg in sodium chloride 0.9 % 100 mL IVPB  Status:  Discontinued       "Followed by" Linked Group Details   100 mg 200 mL/hr over 30 Minutes Intravenous Daily 02/07/20 2335 02/07/20 2336   02/07/20 2345  remdesivir 200 mg in sodium chloride 0.9% 250 mL IVPB  Status:  Discontinued       "Followed by" Linked Group Details   200 mg 580 mL/hr over 30 Minutes Intravenous Once 02/07/20 2335 02/07/20 2336   02/07/20 2330  remdesivir 200 mg in sodium chloride 0.9% 250 mL IVPB       "Followed by" Linked Group Details   200 mg 580 mL/hr over 30 Minutes Intravenous Once 02/07/20 2309 02/08/20 0443             Family Communication/Anticipated D/C date and plan/Code Status   DVT prophylaxis:      Code Status: Full Code  Family Communication: None Disposition Plan:    Status is: Inpatient  Remains inpatient appropriate because:IV treatments appropriate due to intensity of illness or inability to take PO   Dispo: The patient is from: Home              Anticipated d/c is to: Home              Anticipated d/c date is: 3 days              Patient currently is not medically stable to d/c.           Subjective:   C/o shortness of breath with mild exertion.  No nosebleed.  Objective:    Vitals:   02/14/20 0751 02/14/20 0851 02/14/20 1149 02/14/20 1202  BP:  (!) 125/105 (!) 80/52 92/60  Pulse:  92 79 73  Resp: 16 20 18 20   Temp:  97.6 F (36.4 C) 98.7 F (37.1 C)   TempSrc:      SpO2: 92% 95% 95%   Weight:      Height:       No data found.  No intake or output data in the 24 hours ending 02/14/20 1259 Filed Weights   02/12/20 0601 02/13/20 0330 02/14/20 0407  Weight: 91.3 kg 90.6 kg 88.9 kg    Exam:   GEN: NAD SKIN: Warm and dry EYES: No pallor or icterus ENT: MMM CV: RRR PULM: Bibasilar rales ABD: soft, obese, NT, +BS CNS: AAO x 3, non focal EXT: No edema or tenderness          Data Reviewed:  I  have personally reviewed following labs and imaging studies:  Labs: Labs show the following:   Basic Metabolic Panel: Recent Labs  Lab 02/08/20 0734 02/09/20 0437 02/10/20 0529 02/11/20 0545 02/12/20 0503  NA 137 139 142 144 142  K 3.6 3.9 4.0 3.5 3.7  CL 103 106 107 105 101  CO2 25 25 27  32 33*  GLUCOSE 125* 176* 178* 105* 126*  BUN 10 12 19  25* 21*  CREATININE 0.84 0.69 0.71 0.71 0.70  CALCIUM 7.6* 7.7* 8.2* 8.5* 8.0*   GFR Estimated Creatinine Clearance: 99.2 mL/min (by C-G formula based on SCr of 0.7 mg/dL). Liver Function Tests: Recent Labs  Lab 02/08/20 0734 02/09/20 0437 02/10/20 0529 02/11/20 0545 02/12/20 0503  AST 84* 44* 26 30 30   ALT 49* 37 30 29 30   ALKPHOS 209* 163* 141* 132* 119  BILITOT 1.0 0.6 0.5 0.7 0.7  PROT 5.7* 5.5* 5.7* 5.8* 5.7*  ALBUMIN 3.1* 2.7* 2.9* 3.0* 2.9*   Recent Labs  Lab 02/07/20 2147  LIPASE 80*   No results for input(s): AMMONIA in the last 168 hours. Coagulation profile No results for input(s): INR, PROTIME in the last 168 hours.  CBC: Recent Labs  Lab 02/08/20 0734 02/09/20 0437 02/10/20 0529 02/11/20 0545 02/12/20 0503  WBC 4.6 3.1* 6.8 7.3 6.3  NEUTROABS 3.7 2.4 5.8 6.0 5.3  HGB 12.4 11.0* 11.2* 11.8* 11.3*  HCT 35.8* 32.8* 32.8* 34.6* 32.9*  MCV 88.0 90.1 90.9 91.3 90.9  PLT 154 177 236 256 251   Cardiac Enzymes: No results for input(s): CKTOTAL, CKMB, CKMBINDEX, TROPONINI in the last 168 hours. BNP (last 3 results) No results for input(s): PROBNP in the last 8760 hours. CBG: Recent Labs  Lab 02/14/20 1245  GLUCAP 147*   D-Dimer: No results for input(s): DDIMER in the last 72 hours. Hgb A1c: No results for input(s): HGBA1C in the last 72 hours. Lipid Profile: No results for input(s): CHOL, HDL, LDLCALC, TRIG, CHOLHDL, LDLDIRECT in the last 72 hours. Thyroid function studies: No results for input(s): TSH, T4TOTAL, T3FREE, THYROIDAB in the last 72 hours.  Invalid input(s): FREET3 Anemia work  up: Recent Labs    02/12/20 0503  FERRITIN 237   Sepsis Labs: Recent Labs  Lab 02/07/20 1938 02/07/20 2147 02/08/20 0734 02/08/20 0938 02/09/20 0437 02/10/20 0529 02/11/20 0545 02/12/20 0503  PROCALCITON  --   --  1.56  --   --   --   --   --   WBC 3.6*  --  4.6  --  3.1* 6.8 7.3 6.3  LATICACIDVEN 1.6 3.2*  --  1.2  --   --   --   --     Microbiology Recent Results (from the past 240 hour(s))  Culture, blood (routine x 2)     Status: None   Collection Time: 02/07/20  7:38 PM   Specimen: BLOOD  Result Value Ref Range Status   Specimen Description BLOOD RIGHT ANTECUBITAL  Final   Special Requests   Final    BOTTLES DRAWN AEROBIC AND ANAEROBIC Blood Culture results may not be optimal due to an inadequate volume of blood received in culture bottles   Culture   Final    NO GROWTH 5 DAYS Performed at [facility], [address] [place], [place] [address]    Report Status 02/13/2020 FINAL  Final  Culture, blood (routine x 2)     Status: None   Collection Time: 02/07/20  7:38 PM   Specimen:  BLOOD  Result Value Ref Range Status   Specimen Description BLOOD LEFT ANTECUBITAL  Final   Special Requests   Final    BOTTLES DRAWN AEROBIC AND ANAEROBIC Blood Culture adequate volume   Culture   Final    NO GROWTH 5 DAYS Performed at [facility], [address] [place], [place] [address]    Report Status 02/13/2020 FINAL  Final    Procedures and diagnostic studies:  No results found.             LOS: 7 days   Keniah Klemmer  [facility]   Pager on [email or web address]. If 7PM-7AM, please contact night-coverage at [email or web address]     02/14/2020, 12:59 PM

## 2020-02-15 DIAGNOSIS — U071 COVID-19: Secondary | ICD-10-CM | POA: Diagnosis not present

## 2020-02-15 DIAGNOSIS — J9601 Acute respiratory failure with hypoxia: Secondary | ICD-10-CM | POA: Diagnosis not present

## 2020-02-15 DIAGNOSIS — J1282 Pneumonia due to coronavirus disease 2019: Secondary | ICD-10-CM | POA: Diagnosis not present

## 2020-02-15 LAB — URINALYSIS, ROUTINE W REFLEX MICROSCOPIC
Bilirubin Urine: NEGATIVE
Glucose, UA: NEGATIVE mg/dL
Hgb urine dipstick: NEGATIVE
Ketones, ur: NEGATIVE mg/dL
Leukocytes,Ua: NEGATIVE
Nitrite: NEGATIVE
Protein, ur: NEGATIVE mg/dL
Specific Gravity, Urine: 1.019 (ref 1.005–1.030)
pH: 7 (ref 5.0–8.0)

## 2020-02-15 NOTE — Progress Notes (Addendum)
Progress Note    Alicia [name]  OBS:[id number] DOB: 1970-09-21  DOA: 02/07/2020 PCP: Alba Cory, MD      Brief Narrative:    Medical records reviewed and are as summarized below:  Alicia [name] is a 49 y.o. female with a known history of anxiety and depression, hypertension, dyslipidemia, irritable bowel syndrome, restless leg syndrome, history of nonepileptic seizures, history of stroke (loop recorder in place), history of leukocytoclastic vasculitis, rosacea and sleep apnea on CPAP.  She presented to the emergency room with acute onset of worsening dyspnea with associateddry cough and wheezing.   Patient was admitted to the hospital for COVID-19 pneumonia complicated by acute hypoxic respiratory failure.  She was treated with steroids, remdesivir infusion and baricitinib.  She required up to 6 L/min oxygen via nasal cannula.  She was given IV fluids for poor oral intake and hypotension.  She developed right-sided epistaxis that was likely due to dryness from oxygen therapy.  Epistaxis has resolved.    Assessment/Plan:   Principal Problem:   Pneumonia due to COVID-19 virus Active Problems:   Acute hypoxemic respiratory failure (HCC)   Acute hypoxemic respiratory failure secondary to COVID-19 pneumonia:  Positive Covid test on 02/04/2020 at an urgent care center She is still on 5 L/min oxygen via nasal cannula.  Continue weaning attempts. Continue prednisone and baricitinib. Completed IV remdesivir on 02/11/2020 No growth on blood cultures No evidence of pulmonary embolism or DVT.  COVID-19 Labs  No results for input(s): DDIMER, FERRITIN, LDH, CRP in the last 72 hours.  Lab Results  Component Value Date   SARSCOV2NAA NEGATIVE 05/27/2019   SARSCOV2NAA NEGATIVE 01/18/2019   SARSCOV2NAA Not Detected 05/07/2018      Left lower quadrant abdominal pain: Initial CT abdomen pelvis on admission did not show any acute abnormality in the  abdomen or pelvis.  This is probably musculoskeletal pain.  Urinalysis done today did not show any evidence of UTI.  Consider repeat imaging if pain persists or worsens.  Epistaxis (right-sided): Resolved.   Insomnia: Continue trazodone and Ambien as needed for sleep at night  Nausea: Antiemetics as needed  Hypotension: BP has improved.  Continue to hold carvedilol for now.  Discontinue IV fluids.    Leukopenia and elevated liver enzymes: Resolved  History of cryptogenic stroke: She has had a loop recorder since she had a stroke in November 2018   Other comorbidities include hypertension, dyslipidemia, anxiety, depression, GERD, migraine, history of nonepileptic seizures, history of leukocytoclastic vasculitis, OSA.  Continue home medications.  Body mass index is 29.8 kg/m.  (Obesity)       Diet Order            Diet Heart Room service appropriate? Yes; Fluid consistency: Thin  Diet effective now                    Consultants:  None  Procedures:  None    Medications:   . ALPRAZolam  1 mg Oral BH-q7a  . vitamin C  500 mg Oral Daily  . aspirin EC  81 mg Oral Daily  . baricitinib  4 mg Oral Daily  . cholecalciferol  1,000 Units Oral Daily  . cyclobenzaprine  10 mg Oral QPM  . docusate sodium  100 mg Oral BID  . enoxaparin (LOVENOX) injection  0.5 mg/kg Subcutaneous Q24H  . famotidine  20 mg Oral BID  . feeding supplement  237 mL Oral TID BM  .  icosapent Ethyl  1 g Oral TID  . mouth rinse  15 mL Mouth Rinse BID  . multivitamin with minerals  1 tablet Oral Daily  . pantoprazole  40 mg Oral BH-q7a  . PARoxetine  60 mg Oral Daily  . predniSONE  50 mg Oral Daily  . rosuvastatin  40 mg Oral Daily  . traZODone  50 mg Oral QHS  . Vitamin D (Ergocalciferol)  50,000 Units Oral Q7 days  . zinc sulfate  220 mg Oral Daily  . zolpidem  5 mg Oral QHS   Continuous Infusions: . lactated ringers 50 mL/hr at 02/15/20 0914     Anti-infectives (From admission,  onward)   Start     Dose/Rate Route Frequency Ordered Stop   02/08/20 1100  cefTRIAXone (ROCEPHIN) 2 g in sodium chloride 0.9 % 100 mL IVPB  Status:  Discontinued        2 g 200 mL/hr over 30 Minutes Intravenous Every 24 hours 02/08/20 1043 02/10/20 1407   02/08/20 1100  doxycycline (VIBRA-TABS) tablet 100 mg  Status:  Discontinued        100 mg Oral Every 12 hours 02/08/20 1043 02/10/20 1407   02/08/20 1000  remdesivir 100 mg in sodium chloride 0.9 % 100 mL IVPB       "Followed by" Linked Group Details   100 mg 200 mL/hr over 30 Minutes Intravenous Daily 02/07/20 2309 02/11/20 1433   02/08/20 1000  remdesivir 100 mg in sodium chloride 0.9 % 100 mL IVPB  Status:  Discontinued       "Followed by" Linked Group Details   100 mg 200 mL/hr over 30 Minutes Intravenous Daily 02/07/20 2335 02/07/20 2336   02/07/20 2345  remdesivir 200 mg in sodium chloride 0.9% 250 mL IVPB  Status:  Discontinued       "Followed by" Linked Group Details   200 mg 580 mL/hr over 30 Minutes Intravenous Once 02/07/20 2335 02/07/20 2336   02/07/20 2330  remdesivir 200 mg in sodium chloride 0.9% 250 mL IVPB       "Followed by" Linked Group Details   200 mg 580 mL/hr over 30 Minutes Intravenous Once 02/07/20 2309 02/08/20 0443             Family Communication/Anticipated D/C date and plan/Code Status   DVT prophylaxis:      Code Status: Full Code  Family Communication: None Disposition Plan:    Status is: Inpatient  Remains inpatient appropriate because:IV treatments appropriate due to intensity of illness or inability to take PO   Dispo: The patient is from: Home              Anticipated d/c is to: Home              Anticipated d/c date is: 3 days              Patient currently is not medically stable to d/c.           Subjective:   C/o shortness of breath with exertion.  She also complains of left lower abdominal pain.  She thinks she noticed this pain when she was being transferred  from the stretcher onto the CT scanner machine in the radiology department.  However, she said it had not bothered her until today when she felt more pain in the left lower abdomen.  No dysuria, increased frequency of micturition, change in color or smell of urine.   Objective:    Vitals:  02/15/20 0500 02/15/20 0552 02/15/20 0901 02/15/20 1145  BP:  (!) 143/87 117/71 118/76  Pulse:  61 73 69  Resp:  18 18 18   Temp:  97.9 F (36.6 C) 98.3 F (36.8 C) 98 F (36.7 C)  TempSrc:  Oral    SpO2:  100% 96% 97%  Weight: 88.9 kg     Height:       No data found.   Intake/Output Summary (Last 24 hours) at 02/15/2020 1333 Last data filed at 02/15/2020 0323 Gross per 24 hour  Intake 673.81 ml  Output --  Net 673.81 ml   Filed Weights   02/13/20 0330 02/14/20 0407 02/15/20 0500  Weight: 90.6 kg 88.9 kg 88.9 kg    Exam:   GEN: NAD SKIN: Warm and dry EYES: No pallor or icterus ENT: MMM CV: RRR PULM: CTA B ABD: soft, obese, mild left lower quadrant tenderness but no rebound tenderness or guarding, +BS CNS: AAO x 3, non focal EXT: No edema or tenderness         Data Reviewed:   I have personally reviewed following labs and imaging studies:  Labs: Labs show the following:   Basic Metabolic Panel: Recent Labs  Lab 02/09/20 0437 02/10/20 0529 02/11/20 0545 02/12/20 0503  NA 139 142 144 142  K 3.9 4.0 3.5 3.7  CL 106 107 105 101  CO2 25 27 32 33*  GLUCOSE 176* 178* 105* 126*  BUN 12 19 25* 21*  CREATININE 0.69 0.71 0.71 0.70  CALCIUM 7.7* 8.2* 8.5* 8.0*   GFR Estimated Creatinine Clearance: 99.2 mL/min (by C-G formula based on SCr of 0.7 mg/dL). Liver Function Tests: Recent Labs  Lab 02/09/20 0437 02/10/20 0529 02/11/20 0545 02/12/20 0503  AST 44* 26 30 30   ALT 37 30 29 30   ALKPHOS 163* 141* 132* 119  BILITOT 0.6 0.5 0.7 0.7  PROT 5.5* 5.7* 5.8* 5.7*  ALBUMIN 2.7* 2.9* 3.0* 2.9*   No results for input(s): LIPASE, AMYLASE in the last 168  hours. No results for input(s): AMMONIA in the last 168 hours. Coagulation profile No results for input(s): INR, PROTIME in the last 168 hours.  CBC: Recent Labs  Lab 02/09/20 0437 02/10/20 0529 02/11/20 0545 02/12/20 0503  WBC 3.1* 6.8 7.3 6.3  NEUTROABS 2.4 5.8 6.0 5.3  HGB 11.0* 11.2* 11.8* 11.3*  HCT 32.8* 32.8* 34.6* 32.9*  MCV 90.1 90.9 91.3 90.9  PLT 177 236 256 251   Cardiac Enzymes: No results for input(s): CKTOTAL, CKMB, CKMBINDEX, TROPONINI in the last 168 hours. BNP (last 3 results) No results for input(s): PROBNP in the last 8760 hours. CBG: Recent Labs  Lab 02/14/20 1245  GLUCAP 147*   D-Dimer: No results for input(s): DDIMER in the last 72 hours. Hgb A1c: No results for input(s): HGBA1C in the last 72 hours. Lipid Profile: No results for input(s): CHOL, HDL, LDLCALC, TRIG, CHOLHDL, LDLDIRECT in the last 72 hours. Thyroid function studies: No results for input(s): TSH, T4TOTAL, T3FREE, THYROIDAB in the last 72 hours.  Invalid input(s): FREET3 Anemia work up: No results for input(s): VITAMINB12, FOLATE, FERRITIN, TIBC, IRON, RETICCTPCT in the last 72 hours. Sepsis Labs: Recent Labs  Lab 02/09/20 0437 02/10/20 0529 02/11/20 0545 02/12/20 0503  WBC 3.1* 6.8 7.3 6.3    Microbiology Recent Results (from the past 240 hour(s))  Culture, blood (routine x 2)     Status: None   Collection Time: 02/07/20  7:38 PM   Specimen: BLOOD  Result Value Ref  Range Status   Specimen Description BLOOD RIGHT ANTECUBITAL  Final   Special Requests   Final    BOTTLES DRAWN AEROBIC AND ANAEROBIC Blood Culture results may not be optimal due to an inadequate volume of blood received in culture bottles   Culture   Final    NO GROWTH 5 DAYS Performed at [facility], [address] [place], [place] [address]    Report Status 02/13/2020 FINAL  Final  Culture, blood (routine x 2)     Status: None   Collection Time: 02/07/20  7:38 PM   Specimen: BLOOD  Result  Value Ref Range Status   Specimen Description BLOOD LEFT ANTECUBITAL  Final   Special Requests   Final    BOTTLES DRAWN AEROBIC AND ANAEROBIC Blood Culture adequate volume   Culture   Final    NO GROWTH 5 DAYS Performed at [facility], [address] [place], [place] [address]    Report Status 02/13/2020 FINAL  Final    Procedures and diagnostic studies:  No results found.             LOS: 8 days   Alicia [name]  [facility]   Pager on [email or web address]. If 7PM-7AM, please contact night-coverage at [email or web address]     02/15/2020, 1:33 PM

## 2020-02-16 DIAGNOSIS — J1282 Pneumonia due to coronavirus disease 2019: Secondary | ICD-10-CM | POA: Diagnosis not present

## 2020-02-16 DIAGNOSIS — U071 COVID-19: Secondary | ICD-10-CM | POA: Diagnosis not present

## 2020-02-16 MED ORDER — SODIUM CHLORIDE 0.9 % IV SOLN: INTRAVENOUS | Status: DC

## 2020-02-16 MED ORDER — SODIUM CHLORIDE 0.9 % IV BOLUS
1000.0000 mL | Freq: Once | INTRAVENOUS | Status: AC
  Administered 2020-02-16: 10:00:00 1000 mL via INTRAVENOUS

## 2020-02-16 NOTE — Progress Notes (Signed)
Nutrition Follow-up  DOCUMENTATION CODES:   Not applicable  INTERVENTION:   Magic cup TID with meals, each supplement provides 290 kcal and 9 grams of protein (prefers orange flavor)   Ensure Enlive po TID, each supplement provides 350 kcal and 20 grams of protein  MVI with minerals  NUTRITION DIAGNOSIS:   Increased nutrient needs related to acute illness (pneumonia due to COVID-19 virus infection) as evidenced by estimated needs.  GOAL:   Patient will meet greater than or equal to 90% of their needs  MONITOR:   Labs,I & O's,Supplement acceptance,PO intake,Weight trends  REASON FOR ASSESSMENT:   Malnutrition Screening Tool    ASSESSMENT:   50 year old female with history of anxiety, depression, HTN, HLD, IBS, RLS, GERD, sleep apnea on CPAP, fibromuscular dysplasia, and stoke with loop recorder in place presented with acute onset of worsening dyspnea, dry cough, wheezing, headache, fatigue, and chest congestion over the last couple of days. Patient admitted with COVID-19 pneumonia.  Spoke with pt at bedside. Pt stated her appetite is "getting there" as she is trying to consume more since the beginning of her admission. She reports she is able to eat about 50% at meals and drinks about 1-2 Ensures a day. Pt stated she tried a Mining engineer for the first time today and liked it. She reports she did not know what they were until today and she would like to have more. Pt reports she has experienced taste changes. She reports that she can still taste foods but they taste strange to her. No documented meal records in chart.   Pt stated she thinks she has lost some weight but stated that it's "probably a good thing" for weight loss. Per chart review, pt weight was 197 lbs at admission. Pt weight appears to trend downward since admission as her current weight is 195 lbs. This indicates a 1% weight loss in 8 days, which is insignificant for time frame.   Pt in good spirits and hopeful she  will be able to go home by the weekend.   Labs reviewed.   Medications reviewed and include: Vitamin C, Vitamin D, Colace, MVI with minerals, Protonix, Prednisone, Zinc sulfate, NS at 125 mL/hr    NUTRITION - FOCUSED PHYSICAL EXAM:  Flowsheet Row Most Recent Value  Orbital Region No depletion  Upper Arm Region No depletion  Thoracic and Lumbar Region No depletion  Buccal Region No depletion  Temple Region No depletion  Clavicle Bone Region No depletion  Clavicle and Acromion Bone Region No depletion  Scapular Bone Region No depletion  Dorsal Hand No depletion  Patellar Region No depletion  Anterior Thigh Region No depletion  Posterior Calf Region No depletion  Edema (RD Assessment) Mild  Hair Reviewed  Eyes Reviewed  Mouth Reviewed  Skin Reviewed  Nails Reviewed       Diet Order:   Diet Order            Diet Heart Room service appropriate? Yes; Fluid consistency: Thin  Diet effective now                 EDUCATION NEEDS:   Education needs have been addressed  Skin:  Skin Assessment: Reviewed RN Assessment  Last BM:  12/24  Height:   Ht Readings from Last 1 Encounters:  02/07/20 5\' 8"  (1.727 m)    Weight:   Wt Readings from Last 1 Encounters:  02/16/20 88.7 kg    BMI:  Body mass index is 29.73 kg/m.  Estimated Nutritional Needs:   Kcal:  2200-2400 kcal  Protein:  115-135 grams  Fluid:  >2 L/day    Placido Sou, Dietetic Intern Pager: [phone] If unavailable: [phone]

## 2020-02-16 NOTE — Progress Notes (Signed)
PROGRESS NOTE    Alicia [name] Note Spicer  BJS:[id number] DOB: 06-23-1970 DOA: 02/07/2020 PCP: Alba Cory, MD    Brief Narrative:  Alicia [name] Note Alicia [name] is a 49 year old female with past medical history significant for anxiety/depression, essential hypertension, dyslipidemia, IBS, RLS, nonepileptic seizures, CVA, leukocytoclastic vasculitis, rosacea, OSA on CPAP who presents to the emergency department with progressive shortness of breath associated with dry cough and wheezing.  In the ED, temperature nine 9.1, HR 134, SPO2 88% on room air.  Lactic acid 3.2, D-dimer 1223.  Chest x-ray with mild bilateral multifocal infiltrates.  Patient received initially a monoclonal antibiotic fusion followed by 8 mg IV Decadron, IV morphine, IV Zofran and remdesivir.  Hospitalist service consulted for further evaluation and management of acute hypoxic respiratory failure secondary to Covid-19 viral pneumonia.  Assessment & Plan:   Principal Problem:   Pneumonia due to COVID-19 virus Active Problems:   Acute hypoxemic respiratory failure (HCC)   Acute hypoxic respiratory failure secondary to acute Covid-19 viral pneumonia during the ongoing 2020/2021 Covid 19 Pandemic - POA Patient presenting to the ED with progressive shortness of breath associated with cough and wheezing.  Was found to be Covid-19 PCR positive.  Chest x-ray consistent with multifocal pneumonia.  SPO2 88% on room air on presentation.  CT angiogram chest nondiagnostic for PE given timing of IV contrast; but notable for multifocal opacities consistent with viral pneumonia. --COVID test: + 02/04/2020 at [facility] via Care Everywhere --CRP 10.5>8.4>2.9>1.6>7.1 --ddimer 1223>889>833>882>933 --Completed 5-day course of remdesivir on 02/11/2020 --Baricitinib 4mg  PO daily (Day #4/14) --Prednisone 50 mg p.o. daily --prone for 2-3hrs every 12hrs if able --Continue supplemental oxygen, titrate to maintain SPO2 greater than 92%, on 2 L nasal  cannula with SPO2 98% --Amatory O2 screening today --Continue supportive care with albuterol MDI prn, vitamin C, zinc, Tylenol, antitussives (benzonatate/ Mucinex/Tussionex) --CBC, CMP, D-dimer, and CRP in a.m. --Continue airborne/contact isolation precautions for 3 weeks from the day of diagnosis while inpatient (Feb 26, 2020)  The treatment plan and use of medications and known side effects were discussed with patient/family. Some of the medications used are based on case reports/anecdotal data.  All other medications being used in the management of COVID-19 based on limited study data.  Complete risks and long-term side effects are unknown, however in the best clinical judgment they seem to be of some benefit.  Patient wanted to proceed with treatment options provided.  Orthostatic hypotension Patient continues with dizziness especially with standing.  Vital signs this morning with BP lying 107/74, sitting 84/54, standing 57/24.  Etiology secondary to poor oral intake and dehydration. --1 L NS bolus followed by NS at 125 mL's per hour --Repeat orthostatic vital signs this evening and tomorrow morning  Right-sided epistaxis: Resolved Likely secondary to dryness from supplemental oxygen via nasal cannula.  Leukopenia and elevated LFTs: Resolved Etiology likely secondary to Covid-19 viral pneumonia as above.  Hx cryptogenic stroke  Status post loop recorder in place.  Outpatient follow-up with neurology.  Continue aspirin and statin.  Essential hypertension On carvedilol 3.125 mg p.o. twice daily at home --Hold home carvedilol secondary to orthostatic hypotension --Continue aspirin and statin --Continue monitor blood pressure closely  Dyslipidemia --Crestor 40 mg p.o. daily --Vascepa 1g TID  Anxiety/depression --Paroxetine 60 mg p.o. daily --Alprazolam 1 mg every morning  GERD: Continue Protonix 40 mg p.o. daily  OSA --CPAP nightly  Weakness/deconditioning/debility: --PT  evaluation   DVT prophylaxis: Lovenox Code Status: Full code Family Communication: Updated patient extensively at  bedside  Disposition Plan:  Status is: Inpatient  Remains inpatient appropriate because:Ongoing diagnostic testing needed not appropriate for outpatient work up, Unsafe d/c plan, IV treatments appropriate due to intensity of illness or inability to take PO and Inpatient level of care appropriate due to severity of illness   Dispo: The patient is from: Home              Anticipated d/c is to: Home              Anticipated d/c date is: 2 days              Patient currently is not medically stable to d/c.   Consultants:   none  Procedures:   None  Antimicrobials:   None   Subjective: Patient seen and examined bedside, currently having orthostatic vital signs performed by nursing staff.  Patient continues with dizziness upon standing and is notably orthostatic.  Reports continued poor oral intake; only drinking ensures.  Continues with right-sided rib pain from anterior radiating towards her back with coughing.  No other questions or concerns at this time.  Denies headache, no visual changes, no fever/chills/night sweats, no nausea/vomiting/diarrhea, no chest pain, no palpitations, no abdominal pain, no paresthesias.  No acute events overnight per nursing staff.  Objective: Vitals:   02/16/20 0411 02/16/20 0500 02/16/20 0807 02/16/20 1228  BP: 121/64  132/82 106/67  Pulse: 62  68 67  Resp: 16  18 18   Temp: (!) 97.5 F (36.4 C)  (!) 97.5 F (36.4 C) 98 F (36.7 C)  TempSrc: Oral  Oral   SpO2: 98%  98% 97%  Weight:  88.7 kg    Height:        Intake/Output Summary (Last 24 hours) at 02/16/2020 1311 Last data filed at 02/16/2020 0413 Gross per 24 hour  Intake 633.29 ml  Output 450 ml  Net 183.29 ml   Filed Weights   02/14/20 0407 02/15/20 0500 02/16/20 0500  Weight: 88.9 kg 88.9 kg 88.7 kg    Examination:  General exam: Appears calm and comfortable   Respiratory system: Clear to auscultation. Respiratory effort normal.  On 2 L nasal cannula with SPO2 98% Cardiovascular system: S1 & S2 heard, RRR. No JVD, murmurs, rubs, gallops or clicks. No pedal edema. Gastrointestinal system: Abdomen is nondistended, soft and nontender. No organomegaly or masses felt. Normal bowel sounds heard. Central nervous system: Alert and oriented. No focal neurological deficits. Extremities: Symmetric 5 x 5 power. Skin: No rashes, lesions or ulcers Psychiatry: Judgement and insight appear normal. Mood & affect appropriate.     Data Reviewed: I have personally reviewed following labs and imaging studies  CBC: Recent Labs  Lab 02/10/20 0529 02/11/20 0545 02/12/20 0503  WBC 6.8 7.3 6.3  NEUTROABS 5.8 6.0 5.3  HGB 11.2* 11.8* 11.3*  HCT 32.8* 34.6* 32.9*  MCV 90.9 91.3 90.9  PLT 236 256 251   Basic Metabolic Panel: Recent Labs  Lab 02/10/20 0529 02/11/20 0545 02/12/20 0503  NA 142 144 142  K 4.0 3.5 3.7  CL 107 105 101  CO2 27 32 33*  GLUCOSE 178* 105* 126*  BUN 19 25* 21*  CREATININE 0.71 0.71 0.70  CALCIUM 8.2* 8.5* 8.0*   GFR: Estimated Creatinine Clearance: 99.1 mL/min (by C-G formula based on SCr of 0.7 mg/dL). Liver Function Tests: Recent Labs  Lab 02/10/20 0529 02/11/20 0545 02/12/20 0503  AST 26 30 30   ALT 30 29 30   ALKPHOS 141* 132* 119  BILITOT 0.5 0.7 0.7  PROT 5.7* 5.8* 5.7*  ALBUMIN 2.9* 3.0* 2.9*   No results for input(s): LIPASE, AMYLASE in the last 168 hours. No results for input(s): AMMONIA in the last 168 hours. Coagulation Profile: No results for input(s): INR, PROTIME in the last 168 hours. Cardiac Enzymes: No results for input(s): CKTOTAL, CKMB, CKMBINDEX, TROPONINI in the last 168 hours. BNP (last 3 results) No results for input(s): PROBNP in the last 8760 hours. HbA1C: No results for input(s): HGBA1C in the last 72 hours. CBG: Recent Labs  Lab 02/14/20 1245  GLUCAP 147*   Lipid Profile: No  results for input(s): CHOL, HDL, LDLCALC, TRIG, CHOLHDL, LDLDIRECT in the last 72 hours. Thyroid Function Tests: No results for input(s): TSH, T4TOTAL, FREET4, T3FREE, THYROIDAB in the last 72 hours. Anemia Panel: No results for input(s): VITAMINB12, FOLATE, FERRITIN, TIBC, IRON, RETICCTPCT in the last 72 hours. Sepsis Labs: No results for input(s): PROCALCITON, LATICACIDVEN in the last 168 hours.  Recent Results (from the past 240 hour(s))  Culture, blood (routine x 2)     Status: None   Collection Time: 02/07/20  7:38 PM   Specimen: BLOOD  Result Value Ref Range Status   Specimen Description BLOOD RIGHT ANTECUBITAL  Final   Special Requests   Final    BOTTLES DRAWN AEROBIC AND ANAEROBIC Blood Culture results may not be optimal due to an inadequate volume of blood received in culture bottles   Culture   Final    NO GROWTH 5 DAYS Performed at [facility], [address] [place], [place] [address]    Report Status 02/13/2020 FINAL  Final  Culture, blood (routine x 2)     Status: None   Collection Time: 02/07/20  7:38 PM   Specimen: BLOOD  Result Value Ref Range Status   Specimen Description BLOOD LEFT ANTECUBITAL  Final   Special Requests   Final    BOTTLES DRAWN AEROBIC AND ANAEROBIC Blood Culture adequate volume   Culture   Final    NO GROWTH 5 DAYS Performed at [facility], [address] [place], [place] [address]    Report Status 02/13/2020 FINAL  Final         Radiology Studies: No results found.      Scheduled Meds: . ALPRAZolam  1 mg Oral BH-q7a  . vitamin C  500 mg Oral Daily  . aspirin EC  81 mg Oral Daily  . baricitinib  4 mg Oral Daily  . cholecalciferol  1,000 Units Oral Daily  . cyclobenzaprine  10 mg Oral QPM  . docusate sodium  100 mg Oral BID  . enoxaparin (LOVENOX) injection  0.5 mg/kg Subcutaneous Q24H  . famotidine  20 mg Oral BID  . feeding supplement  237 mL Oral TID BM  . icosapent Ethyl  1 g Oral TID  . mouth  rinse  15 mL Mouth Rinse BID  . multivitamin with minerals  1 tablet Oral Daily  . pantoprazole  40 mg Oral BH-q7a  . PARoxetine  60 mg Oral Daily  . predniSONE  50 mg Oral Daily  . rosuvastatin  40 mg Oral Daily  . traZODone  50 mg Oral QHS  . Vitamin D (Ergocalciferol)  50,000 Units Oral Q7 days  . zinc sulfate  220 mg Oral Daily  . zolpidem  5 mg Oral QHS   Continuous Infusions: . sodium chloride 125 mL/hr at 02/16/20 1222     LOS: 9 days    Time  spent: 39 minutes spent on chart review, discussion with nursing staff, consultants, updating family and interview/physical exam; more than 50% of that time was spent in counseling and/or coordination of care.    Alvira Philips [place], DO [facility] Available via Epic secure chat 7am-7pm After these hours, please refer to coverage provider listed on [email or web address] 02/16/2020, 1:11 PM

## 2020-02-16 NOTE — Progress Notes (Signed)
PT Cancellation Note  Patient Details Name: Alicia [name] Note Calaway MRN: [id number] DOB: [birth year]   Cancelled Treatment:    Reason Eval/Treat Not Completed: Other (comment). Per RN and chart, Pt evaluation held due to pt being symptomatic and very orthostatic earlier today. PT to re-attempt as able.  Olga Coaster PT, DPT 3:13 PM,02/16/20

## 2020-02-17 DIAGNOSIS — U071 COVID-19: Secondary | ICD-10-CM | POA: Diagnosis not present

## 2020-02-17 DIAGNOSIS — J1282 Pneumonia due to coronavirus disease 2019: Secondary | ICD-10-CM | POA: Diagnosis not present

## 2020-02-17 LAB — COMPREHENSIVE METABOLIC PANEL
ALT: 108 U/L — ABNORMAL HIGH (ref 0–44)
AST: 67 U/L — ABNORMAL HIGH (ref 15–41)
Albumin: 2.7 g/dL — ABNORMAL LOW (ref 3.5–5.0)
Alkaline Phosphatase: 85 U/L (ref 38–126)
Anion gap: 8 (ref 5–15)
BUN: 19 mg/dL (ref 6–20)
CO2: 31 mmol/L (ref 22–32)
Calcium: 8.2 mg/dL — ABNORMAL LOW (ref 8.9–10.3)
Chloride: 103 mmol/L (ref 98–111)
Creatinine, Ser: 0.77 mg/dL (ref 0.44–1.00)
GFR, Estimated: >60 mL/min (ref >60)
Glucose, Bld: 108 mg/dL — ABNORMAL HIGH (ref 70–99)
Potassium: 3.8 mmol/L (ref 3.5–5.1)
Sodium: 142 mmol/L (ref 135–145)
Total Bilirubin: 0.7 mg/dL (ref 0.3–1.2)
Total Protein: 5.3 g/dL — ABNORMAL LOW (ref 6.5–8.1)

## 2020-02-17 LAB — CBC
HCT: 30.9 % — ABNORMAL LOW (ref 36.0–46.0)
Hemoglobin: 10.6 g/dL — ABNORMAL LOW (ref 12.0–15.0)
MCH: 30.9 pg (ref 26.0–34.0)
MCHC: 34.3 g/dL (ref 30.0–36.0)
MCV: 90.1 fL (ref 80.0–100.0)
Platelets: 324 10*3/uL (ref 150–400)
RBC: 3.43 MIL/uL — ABNORMAL LOW (ref 3.87–5.11)
RDW: 13 % (ref 11.5–15.5)
WBC: 11.9 10*3/uL — ABNORMAL HIGH (ref 4.0–10.5)
nRBC: 0 % (ref 0.0–0.2)

## 2020-02-17 LAB — FIBRIN DERIVATIVES D-DIMER (ARMC ONLY): Fibrin derivatives D-dimer (ARMC): 758.96 ng/mL (FEU) — ABNORMAL HIGH (ref 0.00–499.00)

## 2020-02-17 LAB — C-REACTIVE PROTEIN: CRP: 0.6 mg/dL (ref <1.0)

## 2020-02-17 MED ORDER — SODIUM CHLORIDE 0.9 % IV SOLN: INTRAVENOUS | Status: DC

## 2020-02-17 MED ORDER — PREDNISONE 20 MG PO TABS
40.0000 mg | ORAL_TABLET | Freq: Every day | ORAL | Status: DC
  Administered 2020-02-18: 40 mg via ORAL
  Filled 2020-02-17: qty 2

## 2020-02-17 MED ORDER — SODIUM CHLORIDE 0.9 % IV BOLUS
2000.0000 mL | Freq: Once | INTRAVENOUS | Status: AC
  Administered 2020-02-17: 2000 mL via INTRAVENOUS

## 2020-02-17 NOTE — Progress Notes (Signed)
PROGRESS Alicia    Alicia [name] Alicia [name]  WEX:[id number] DOB: 12-02-1970 DOA: 02/07/2020 PCP: Alba Cory, MD    Brief Narrative:  Alicia [name] is a 49 year old female with past medical history significant for anxiety/depression, essential hypertension, dyslipidemia, IBS, RLS, nonepileptic seizures, CVA, leukocytoclastic vasculitis, rosacea, OSA on CPAP who presents to the emergency department with progressive shortness of breath associated with dry cough and wheezing.  In the ED, temperature nine 9.1, HR 134, SPO2 88% on room air.  Lactic acid 3.2, D-dimer 1223.  Chest x-ray with mild bilateral multifocal infiltrates.  Patient received initially a monoclonal antibiotic fusion followed by 8 mg IV Decadron, IV morphine, IV Zofran and remdesivir.  Hospitalist service consulted for further evaluation and management of acute hypoxic respiratory failure secondary to Covid-19 viral pneumonia.  Assessment & Plan:   Principal Problem:   Pneumonia due to COVID-19 virus Active Problems:   Acute hypoxemic respiratory failure (HCC)   Acute hypoxic respiratory failure secondary to acute Covid-19 viral pneumonia during the ongoing 2020/2021 Covid 19 Pandemic - POA Patient presenting to the ED with progressive shortness of breath associated with cough and wheezing.  Was found to be Covid-19 PCR positive.  Chest x-ray consistent with multifocal pneumonia.  SPO2 88% on room air on presentation.  CT angiogram chest nondiagnostic for PE given timing of IV contrast; but notable for multifocal opacities consistent with viral pneumonia. --COVID test: + 02/04/2020 at [facility] via Care Everywhere --CRP 10.5>8.4>2.9>1.6>7.1>0.6 --ddimer 1223>889>833>882>933>758 --Completed 5-day course of remdesivir on 02/11/2020 --Baricitinib 4mg  PO daily (Day #5/14) --Prednisone 50 mg p.o. daily; decrease to 40mg  PO daily 12/31 --prone for 2-3hrs every 12hrs if able --Continue supplemental oxygen, titrate to  maintain SPO2 greater than 92%, on 2 L nasal cannula with SPO2 98% --Ambulatory O2 screen today to assess for home O2 needs --Continue supportive care with albuterol MDI prn, vitamin C, zinc, Tylenol, antitussives (benzonatate/ Mucinex/Tussionex) --CBC, CMP, D-dimer, and CRP in a.m. --Continue airborne/contact isolation precautions for 3 weeks from the day of diagnosis while inpatient (Feb 25, 2020)  The treatment plan and use of medications and known side effects were discussed with patient/family. Some of the medications used are based on case reports/anecdotal data.  All other medications being used in the management of COVID-19 based on limited study data.  Complete risks and long-term side effects are unknown, however in the best clinical judgment they seem to be of some benefit.  Patient wanted to proceed with treatment options provided.  Orthostatic hypotension Patient continues with dizziness especially with standing.  Vital signs this morning with BP lying 117/83, sitting 99/75, standing 80/47; improved since yesterday.  Etiology secondary to poor oral intake and dehydration. --2 L NS bolus followed by NS at 125 mL's per hour --Repeat orthostatic vital signs this evening and tomorrow morning  Right-sided epistaxis: Resolved Likely secondary to dryness from supplemental oxygen via nasal cannula.  Leukopenia and elevated LFTs: Resolved Etiology likely secondary to Covid-19 viral pneumonia as above.  Hx cryptogenic stroke  Status post loop recorder in place.  Outpatient follow-up with neurology.  Continue aspirin and statin.  Essential hypertension On carvedilol 3.125 mg p.o. twice daily at home --Hold home carvedilol secondary to orthostatic hypotension --Continue aspirin and statin --Continue monitor blood pressure closely  Dyslipidemia --Crestor 40 mg p.o. daily --Vascepa 1g TID  Anxiety/depression --Paroxetine 60 mg p.o. daily --Alprazolam 1 mg every morning  GERD:  Continue Protonix 40 mg p.o. daily  OSA --CPAP nightly  Weakness/deconditioning/debility: --PT evaluation  pending   DVT prophylaxis: Lovenox Code Status: Full code Family Communication: Updated patient extensively at bedside  Disposition Plan:  Status is: Inpatient  Remains inpatient appropriate because:Ongoing diagnostic testing needed not appropriate for outpatient work up, Unsafe d/c plan, IV treatments appropriate due to intensity of illness or inability to take PO and Inpatient level of care appropriate due to severity of illness   Dispo: The patient is from: Home              Anticipated d/c is to: Home              Anticipated d/c date is: 2 days              Patient currently is not medically stable to d/c.   Consultants:   none  Procedures:   None  Antimicrobials:   None   Subjective: Patient seen and examined at bedside, resting comfortably sitting up in bed.  Following IV fluid bolus and infusion yesterday, dizziness much improved.  Overall feels better.  Repeat orthostatic vital signs improved but continues to be orthostatic.  Discussed with patient will repeat IV fluid bolus today.  Pending PT evaluation.  Discussed with patient that she needs to ensure adequate oral intake especially when she goes home.  No other questions or concerns at this time. Denies headache, no visual changes, no fever/chills/night sweats, no nausea/vomiting/diarrhea, no chest pain, no palpitations, no abdominal pain, no paresthesias.  No acute events overnight per nursing staff.  Objective: Vitals:   02/17/20 0020 02/17/20 0437 02/17/20 0451 02/17/20 0734  BP: 132/79 104/66  117/83  Pulse: 64 70  66  Resp: 18 16  18   Temp: 98.3 F (36.8 C) 97.8 F (36.6 C)  98 F (36.7 C)  TempSrc: Oral Oral  Oral  SpO2: 100% 97%  100%  Weight:   87.2 kg   Height:        Intake/Output Summary (Last 24 hours) at 02/17/2020 1044 Last data filed at 02/16/2020 1655 Gross per 24 hour   Intake 480 ml  Output --  Net 480 ml   Filed Weights   02/15/20 0500 02/16/20 0500 02/17/20 0451  Weight: 88.9 kg 88.7 kg 87.2 kg    Examination:  General exam: Appears calm and comfortable  Respiratory system: Clear to auscultation. Respiratory effort normal.  On 2 L nasal cannula with SPO2 97% at rest Cardiovascular system: S1 & S2 heard, RRR. No JVD, murmurs, rubs, gallops or clicks. No pedal edema. Gastrointestinal system: Abdomen is nondistended, soft and nontender. No organomegaly or masses felt. Normal bowel sounds heard. Central nervous system: Alert and oriented. No focal neurological deficits. Extremities: Symmetric 5 x 5 power. Skin: No rashes, lesions or ulcers Psychiatry: Judgement and insight appear normal. Mood & affect appropriate.     Data Reviewed: I have personally reviewed following labs and imaging studies  CBC: Recent Labs  Lab 02/11/20 0545 02/12/20 0503 02/17/20 0354  WBC 7.3 6.3 11.9*  NEUTROABS 6.0 5.3  --   HGB 11.8* 11.3* 10.6*  HCT 34.6* 32.9* 30.9*  MCV 91.3 90.9 90.1  PLT 256 251 324   Basic Metabolic Panel: Recent Labs  Lab 02/11/20 0545 02/12/20 0503 02/17/20 0354  NA 144 142 142  K 3.5 3.7 3.8  CL 105 101 103  CO2 32 33* 31  GLUCOSE 105* 126* 108*  BUN 25* 21* 19  CREATININE 0.71 0.70 0.77  CALCIUM 8.5* 8.0* 8.2*   GFR: Estimated Creatinine Clearance: 98.3 mL/min (by  C-G formula based on SCr of 0.77 mg/dL). Liver Function Tests: Recent Labs  Lab 02/11/20 0545 02/12/20 0503 02/17/20 0354  AST 30 30 67*  ALT 29 30 108*  ALKPHOS 132* 119 85  BILITOT 0.7 0.7 0.7  PROT 5.8* 5.7* 5.3*  ALBUMIN 3.0* 2.9* 2.7*   No results for input(s): LIPASE, AMYLASE in the last 168 hours. No results for input(s): AMMONIA in the last 168 hours. Coagulation Profile: No results for input(s): INR, PROTIME in the last 168 hours. Cardiac Enzymes: No results for input(s): CKTOTAL, CKMB, CKMBINDEX, TROPONINI in the last 168 hours. BNP  (last 3 results) No results for input(s): PROBNP in the last 8760 hours. HbA1C: No results for input(s): HGBA1C in the last 72 hours. CBG: Recent Labs  Lab 02/14/20 1245  GLUCAP 147*   Lipid Profile: No results for input(s): CHOL, HDL, LDLCALC, TRIG, CHOLHDL, LDLDIRECT in the last 72 hours. Thyroid Function Tests: No results for input(s): TSH, T4TOTAL, FREET4, T3FREE, THYROIDAB in the last 72 hours. Anemia Panel: No results for input(s): VITAMINB12, FOLATE, FERRITIN, TIBC, IRON, RETICCTPCT in the last 72 hours. Sepsis Labs: No results for input(s): PROCALCITON, LATICACIDVEN in the last 168 hours.  Recent Results (from the past 240 hour(s))  Culture, blood (routine x 2)     Status: None   Collection Time: 02/07/20  7:38 PM   Specimen: BLOOD  Result Value Ref Range Status   Specimen Description BLOOD RIGHT ANTECUBITAL  Final   Special Requests   Final    BOTTLES DRAWN AEROBIC AND ANAEROBIC Blood Culture results may not be optimal due to an inadequate volume of blood received in culture bottles   Culture   Final    NO GROWTH 5 DAYS Performed at [facility], [address] [place], [place] [address]    Report Status 02/13/2020 FINAL  Final  Culture, blood (routine x 2)     Status: None   Collection Time: 02/07/20  7:38 PM   Specimen: BLOOD  Result Value Ref Range Status   Specimen Description BLOOD LEFT ANTECUBITAL  Final   Special Requests   Final    BOTTLES DRAWN AEROBIC AND ANAEROBIC Blood Culture adequate volume   Culture   Final    NO GROWTH 5 DAYS Performed at [facility], [address] [place], [place] [address]    Report Status 02/13/2020 FINAL  Final         Radiology Studies: No results found.      Scheduled Meds: . ALPRAZolam  1 mg Oral BH-q7a  . vitamin C  500 mg Oral Daily  . aspirin EC  81 mg Oral Daily  . baricitinib  4 mg Oral Daily  . cholecalciferol  1,000 Units Oral Daily  . cyclobenzaprine  10 mg Oral QPM  .  docusate sodium  100 mg Oral BID  . enoxaparin (LOVENOX) injection  0.5 mg/kg Subcutaneous Q24H  . famotidine  20 mg Oral BID  . feeding supplement  237 mL Oral TID BM  . icosapent Ethyl  1 g Oral TID  . mouth rinse  15 mL Mouth Rinse BID  . multivitamin with minerals  1 tablet Oral Daily  . pantoprazole  40 mg Oral BH-q7a  . PARoxetine  60 mg Oral Daily  . predniSONE  50 mg Oral Daily  . rosuvastatin  40 mg Oral Daily  . traZODone  50 mg Oral QHS  . Vitamin D (Ergocalciferol)  50,000 Units Oral Q7 days  . zinc  sulfate  220 mg Oral Daily  . zolpidem  5 mg Oral QHS   Continuous Infusions: . sodium chloride       LOS: 10 days    Time spent: 36 minutes spent on chart review, discussion with nursing staff, consultants, updating family and interview/physical exam; more than 50% of that time was spent in counseling and/or coordination of care.    Alvira Philips [place], DO [facility] Available via Epic secure chat 7am-7pm After these hours, please refer to coverage provider listed on [email or web address] 02/17/2020, 10:44 AM

## 2020-02-17 NOTE — Evaluation (Addendum)
Physical Therapy Evaluation Patient Details Name: Alicia [name] Note Clift MRN: [id number] DOB: 04/17/1970 Today's Date: 02/17/2020   History of Present Illness  Patient is a 49 year old female with past medical history significant for anxiety/depression, essential hypertension, dyslipidemia, IBS, RLS, nonepileptic seizures, CVA, leukocytoclastic vasculitis, rosacea, OSA on CPAP who presents to the emergency department with progressive shortness of breath and cough. Patient with Pneumonia due to COVID-19 virus and acute respiratory failure.Orthostatic hypotension with etiology secondary to poor oral intake and dehydration and getting fluid bolus.    Clinical Impression  PT evaluation completed. Patient cooperative during exam and eager to be weaned from oxygen and discharge home. Patient is Mod I for bed mobility and transfers. Orthostatic vitals monitored during session. Therapist assessed readiness for ambulation and response to upright activity. Measurements were 75/55 supine, 79/56 sitting, 85/54 standing 0 minutes, 76/64 standing 3 minutes. 94/58 after sitting upright ~ 8 minutes. Patient does not report dizziness with standing activity, however reports feeling generalized weakness and is fatigued with activity. Patient declined attempting to ambulate at this time, but does reports this is the best she has felt with mobility since arriving. Patient did stand for ~ 4 minutes with no loss of balance and without UE support, Sp02 92% with activity. Educated patient importance of energy conservation and breathing techniques if feeling shortness of breath during functional activity. Anticipate patient will be able to discharge home with support from her family. Consider HHPT as patient has generalized weakness.     Follow Up Recommendations Home health PT    Equipment Recommendations  None recommended by PT    Recommendations for Other Services       Precautions / Restrictions  Precautions Precautions: Fall Restrictions Weight Bearing Restrictions: No      Mobility  Bed Mobility Overal bed mobility: Modified Independent                  Transfers Overall transfer level: Modified independent               General transfer comment: extra time required but no physical assistance needed  Ambulation/Gait             General Gait Details: orthostatic vitals monitored during session. 75/55 supine, 79/56 sitting, 85/54 standing 0 minutes, 76/64 standing 3 minutes. 94/58 after sitting upright ~ 8 minutes. patient reports no dizziness with any activity but reports feeling "weakness" in standing and declined ambulation at this time. patient was able to stand for 4 minutes unsupported without loss of balance and no UE support. Sp02 92% on 5 L in standing.  Stairs            Wheelchair Mobility    Modified Rankin (Stroke Patients Only)       Balance Overall balance assessment: Needs assistance Sitting-balance support: Feet supported Sitting balance-Leahy Scale: Good     Standing balance support: No upper extremity supported Standing balance-Leahy Scale: Good                               Pertinent Vitals/Pain Pain Assessment: No/denies pain    Home Living Family/patient expects to be discharged to:: Private residence Living Arrangements: Spouse/significant other;Children Available Help at Discharge: Family;Available 24 hours/day Type of Home: House       Home Layout: One level        Prior Function Level of Independence: Independent         Comments: patient is  an Airline pilot. patient reports she has balance and cooridnation issues at times following previous stroke and fatigues quicker than normal. patient reports what sounds like pre-syncopal episodes at home     Hand Dominance        Extremity/Trunk Assessment   Upper Extremity Assessment Upper Extremity Assessment: Generalized weakness     Lower Extremity Assessment Lower Extremity Assessment: Generalized weakness       Communication   Communication: No difficulties  Cognition Arousal/Alertness: Awake/alert Behavior During Therapy: WFL for tasks assessed/performed Overall Cognitive Status: Within Functional Limits for tasks assessed                                        General Comments      Exercises Other Exercises Other Exercises: encouraged HEP including SLR, heelslides, ankle pumps, and LAQ to maintain LE strength.   Assessment/Plan    PT Assessment Patient needs continued PT services  PT Problem List Decreased strength;Decreased activity tolerance;Decreased mobility;Cardiopulmonary status limiting activity       PT Treatment Interventions DME instruction;Gait training;Functional mobility training;Therapeutic activities;Therapeutic exercise    PT Goals (Current goals can be found in the Care Plan section)  Acute Rehab PT Goals Patient Stated Goal: to go home PT Goal Formulation: With patient Time For Goal Achievement: 03/02/20 Potential to Achieve Goals: Good    Frequency Min 2X/week   Barriers to discharge        Co-evaluation               AM-PAC PT "6 Clicks" Mobility  Outcome Measure Help needed turning from your back to your side while in a flat bed without using bedrails?: None Help needed moving from lying on your back to sitting on the side of a flat bed without using bedrails?: None Help needed moving to and from a bed to a chair (including a wheelchair)?: None Help needed standing up from a chair using your arms (e.g., wheelchair or bedside chair)?: None Help needed to walk in hospital room?: A Little Help needed climbing 3-5 steps with a railing? : A Little 6 Click Score: 22    End of Session Equipment Utilized During Treatment: Oxygen Activity Tolerance: Patient limited by fatigue Patient left: in bed;with call bell/phone within reach Nurse  Communication: Mobility status PT Visit Diagnosis: Muscle weakness (generalized) (M62.81)    Time: 8546-2703 PT Time Calculation (min) (ACUTE ONLY): 42 min   Charges:   PT Evaluation $PT Eval High Complexity: 1 High PT Treatments $Therapeutic Activity: 23-37 mins        Donna Bernard, PT, MPT   Ina Homes 02/17/2020, 11:52 AM

## 2020-02-17 NOTE — Progress Notes (Signed)
SATURATION QUALIFICATIONS: (This note is used to comply with regulatory documentation for home oxygen)  Patient Saturations on Room Air at Rest = 94%  Patient Saturations on Room Air while Ambulating = 85%  Patient Saturations on 3 Liters of oxygen while Ambulating = 92%  Please briefly explain why patient needs home oxygen:COVID

## 2020-02-17 NOTE — TOC Initial Note (Signed)
Transition of Care [facility]) - Initial/Assessment Note    Patient Details  Name: Alicia [name] MRN: [id number] Date of Birth: [birth year]  [facility]) CM/SW Contact:    Allayne Butcher, RN Phone Number: 02/17/2020, 1:16 PM  Clinical Narrative:                 Patient admitted to the hospital with COVID 19 requiring supplemental oxygen currently on 5 L [place].  RNCM was able to speak with patient via phone about discharge planning.  PT has recommended home health services, patient would like to hold off on setting that up for now.  Patient will likely need home oxygen at discharge and patient agrees with oxygen being set up through [facility].  Patient has a pulse oximeter at home so she can monitor her oxygenation.   Patient lives with her husband and is independent at baseline.  Patient is current with PCP and uses [facility] for prescriptions.   Expected Discharge Plan: Home/Self Care Barriers to Discharge: Continued Medical Work up   Patient Goals and CMS Choice Patient states their goals for this hospitalization and ongoing recovery are:: Will be glad to go home      Expected Discharge Plan and Services Expected Discharge Plan: Home/Self Care   Discharge Planning Services: CM Consult   Living arrangements for the past 2 months: Single Family Home                 DME Arranged: Oxygen DME Agency: [facility]       HH Arranged: Patient Refused HH          Prior Living Arrangements/Services Living arrangements for the past 2 months: Single Family Home Lives with:: Spouse Patient language and need for interpreter reviewed:: Yes Do you feel safe going back to the place where you live?: Yes      Need for Family Participation in Patient Care: Yes (Comment) (COVID) Care giver support system in place?: Yes (comment) (husband)   Criminal Activity/Legal Involvement Pertinent to Current Situation/Hospitalization: No - Comment as needed  Activities of Daily  Living Home Assistive Devices/Equipment: None ADL Screening (condition at time of admission) Patient's cognitive ability adequate to safely complete daily activities?: Yes Is the patient deaf or have difficulty hearing?: No Does the patient have difficulty seeing, even when wearing glasses/contacts?: No Does the patient have difficulty concentrating, remembering, or making decisions?: No Patient able to express need for assistance with ADLs?: Yes Does the patient have difficulty dressing or bathing?: No Independently performs ADLs?: Yes (appropriate for developmental age) Does the patient have difficulty walking or climbing stairs?: No Weakness of Legs: Both Weakness of Arms/Hands: Both  Permission Sought/Granted Permission sought to share information with : Case Manager,Family Supports Permission granted to share information with : Yes, Verbal Permission Granted  Share Information with NAME: Jonny Ruiz     Permission granted to share info w Relationship: husband     Emotional Assessment   Attitude/Demeanor/Rapport: Engaged Affect (typically observed): Accepting Orientation: : Oriented to Self,Oriented to Place,Oriented to  Time,Oriented to Situation Alcohol / Substance Use: Not Applicable Psych Involvement: No (comment)  Admission diagnosis:  Tachypnea [R06.82] Hypoxia [R09.02] Positive D-dimer [R79.89] Family history of ventricular tachycardia [Z82.49] Dyspnea, unspecified type [R06.00] Acute hypoxemic respiratory failure due to COVID-19 (HCC) [U07.1, J96.01] COVID [U07.1] Patient Active Problem List   Diagnosis Date Noted  . Acute hypoxemic respiratory failure (HCC) 02/09/2020  . Pneumonia due to COVID-19 virus 02/07/2020  . Reactive airways dysfunction syndrome (  HCC) 10/15/2019  . Hyperfunction of pituitary gland, unspecified (HCC) 10/15/2019  . Abdominal pain 05/31/2019  . Calculus of gallbladder without cholecystitis without obstruction 05/06/2019  . Cyst of pancreas  05/06/2019  . Right kidney stone 05/06/2019  . Leukocytoclastic vasculitis (HCC) 10/07/2018  . Chronic insomnia 02/09/2018  . History of anemia 02/09/2018  . Sleep apnea 09/24/2017  . History of stroke 05/30/2017  . Hyperlipidemia 05/30/2017  . Migraine without aura and without status migrainosus, not intractable 05/27/2017  . Pain in right knee 01/21/2017  . Psychogenic nonepileptic seizure   . Chronic pain syndrome   . Restless leg syndrome   . Chronic prescription benzodiazepine use 01/20/2016  . Leukocytosis 01/20/2016  . Seizure-like activity (HCC)   . GAD (generalized anxiety disorder) 02/14/2015  . Chronic neck pain 02/14/2015  . History of hysterectomy 02/14/2015  . Grieving 12/16/2014  . Iron deficiency anemia due to chronic blood loss 08/29/2014  . Insomnia, persistent 08/14/2014  . Major depression (HCC) 08/14/2014  . Temporomandibular joint sounds on opening and/or closing the jaw 08/14/2014  . Degenerative disc disease, lumbar 08/14/2014  . Bleeding internal hemorrhoids 08/14/2014  . Gastric reflux 08/14/2014  . Blood glucose elevated 08/14/2014  . Irritable bowel syndrome with constipation 08/14/2014  . Hypertriglyceridemia 08/14/2014  . Overweight 08/14/2014  . Tinnitus 08/14/2014  . Vitamin D deficiency 08/14/2014  . Tachycardia 11/25/2012  . DOE (dyspnea on exertion) 11/06/2012   PCP:  Alba Cory, MD Pharmacy:   [id number] - [place], [place] - [address] AT [facility] [address] & [address] [place] [address] Phone: [phone] Fax: [phone]  [facility] - [place], [place] - [address] [place] [address] Phone: [phone] Fax: [phone]  [facility] [place], [place] - [address] [place] [address] Phone: [phone] Fax: [phone]     Social Determinants of Health (SDOH)  Interventions    Readmission Risk Interventions No flowsheet data found.

## 2020-02-18 MED ORDER — ALBUTEROL SULFATE HFA 108 (90 BASE) MCG/ACT IN AERS: 2.0000 | INHALATION_SPRAY | Freq: Four times a day (QID) | RESPIRATORY_TRACT | 2 refills | Status: DC | PRN

## 2020-02-18 MED ORDER — ASCORBIC ACID 500 MG PO TABS: 500.0000 mg | ORAL_TABLET | Freq: Every day | ORAL | 0 refills | Status: AC

## 2020-02-18 MED ORDER — GUAIFENESIN-DM 100-10 MG/5ML PO SYRP: 10.0000 mL | ORAL_SOLUTION | ORAL | 0 refills | Status: DC | PRN

## 2020-02-18 MED ORDER — PREDNISONE 10 MG PO TABS: ORAL_TABLET | ORAL | 0 refills | Status: AC

## 2020-02-18 MED ORDER — ZINC SULFATE 220 (50 ZN) MG PO CAPS: 220.0000 mg | ORAL_CAPSULE | Freq: Every day | ORAL | 0 refills | Status: AC

## 2020-02-18 NOTE — TOC Transition Note (Signed)
Transition of Care [facility]) - CM/SW Discharge Note   Patient Details  Name: Alicia [name] MRN: [id number] Date of Birth: Nov 14, 1970  [facility]) CM/SW Contact:  Allayne Butcher, RN Phone Number: 02/18/2020, 8:36 AM   Clinical Narrative:    Patient qualifies for home oxygen related to her COVID diagnosis.  Velna Hatchet with [employer] accepted referral for home oxygen.  Oxygen will be delivered to the patient's room before discharge and home oxygen set up will be delivered once the patient gets home.    Final next level of care: Home/Self Care Barriers to Discharge: Barriers Resolved   Patient Goals and CMS Choice Patient states their goals for this hospitalization and ongoing recovery are:: Will be glad to go home      Discharge Placement                       Discharge Plan and Services   Discharge Planning Services: CM Consult            DME Arranged: Oxygen DME Agency: [facility] Date DME Agency Contacted: 02/18/20 Time DME Agency Contacted: [phone] Representative spoke with at DME Agency: Francesco Sor Arranged: Patient Refused HH          Social Determinants of Health (SDOH) Interventions     Readmission Risk Interventions No flowsheet data found.

## 2020-02-18 NOTE — Discharge Summary (Signed)
Physician Discharge Summary  Alicia [name] DSK:[id number] DOB: 1970/09/16 DOA: 02/07/2020  PCP: Alba Cory, MD  Admit date: 02/07/2020 Discharge date: 02/18/2020  Admitted From: Home Disposition: Home  Recommendations for Outpatient Follow-up:  1. Follow up with PCP in 1-2 weeks 2. Continue present on taper for Covid-19 viral pneumonia 3. Continue supportive care with albuterol MDI as needed, Tylenol, zinc, vitamin C, antitussives as needed 4. Please obtain CMP in one week to ensure elevated LFTs improving/resolved 5. Discontinued Coreg for borderline hypotension associated with orthostasis 6. Encourage Covid-19 vaccination  Home Health: Patient declined Equipment/Devices: Oxygen, 3 L per nasal cannula, pulse oximeter  Discharge Condition: Stable CODE STATUS: Full code Diet recommendation: Regular diet  History of present illness:  Alicia [name] is a 49 year old female with past medical history significant for anxiety/depression, essential hypertension, dyslipidemia, IBS, RLS, nonepileptic seizures, CVA, leukocytoclastic vasculitis, rosacea, OSA on CPAP who presents to the emergency department with progressive shortness of breath associated with dry cough and wheezing.  In the ED, temperature nine 9.1, HR 134, SPO2 88% on room air.  Lactic acid 3.2, D-dimer 1223.  Chest x-ray with mild bilateral multifocal infiltrates.  Patient received initially a monoclonal antibiotic fusion followed by 8 mg IV Decadron, IV morphine, IV Zofran and remdesivir.  Hospitalist service consulted for further evaluation and management of acute hypoxic respiratory failure secondary to Covid-19 viral pneumonia.  Hospital course:  Acute hypoxic respiratory failure secondary to acute Covid-19 viral pneumonia during the ongoing 2020/2021 Covid 19 Pandemic - POA Patient presenting to the ED with progressive shortness of breath associated with cough and wheezing.  Was found to be Covid-19  PCR positive.  Chest x-ray consistent with multifocal pneumonia.  SPO2 88% on room air on presentation.  CT angiogram chest nondiagnostic for PE given timing of IV contrast; but notable for multifocal opacities consistent with viral pneumonia. COVID PCR positive 02/04/2020 at [facility] via [facility].  CRP 10.5 with a D-dimer 1223 on admission.  Patient completed 5-day course of remdesivir on 02/11/2020.  Patient completed 6-day course of baricitinib 4 mg p.o. daily.  Patient was started on IV steroids which was slowly tapered down and will continue prednisone taper on discharge.  Patient continues to require supplemental oxygen to maintain SPO2 at 3 L per nasal cannula.  Continue supportive care with albuterol MDI as needed, vitamin C, zinc, Tylenol, antitussives as needed.  Continue isolation/quarantine per CDC guidelines.  Recommend Covid-19 vaccination.  The treatment plan and use of medications and known side effects were discussed with patient/family. Some of the medications used are based on case reports/anecdotal data.  All other medications being used in the management of COVID-19 based on limited study data.  Complete risks and long-term side effects are unknown, however in the best clinical judgment they seem to be of some benefit.  Patient wanted to proceed with treatment options provided.  Orthostatic hypotension Patient continues with dizziness especially with standing.  Vital signs with notable drop in systolic blood pressure from a lying to standing position. Etiology secondary to poor oral intake and dehydration.  Patient was aggressively fluid resuscitated during hospitalization with improvement.  We will continue to hold home carvedilol until follows up with PCP and cardiology.  Recommend repeat orthostatic vital signs at next PCP/specialist visit.  Right-sided epistaxis: Resolved Likely secondary to dryness from supplemental oxygen via nasal cannula.  Leukopenia and elevated  LFTs: Resolved Etiology likely secondary to Covid-19 viral pneumonia as above.  Hx cryptogenic stroke  Status post loop recorder in place.  Outpatient follow-up with neurology.  Continue aspirin and statin.  Essential hypertension On carvedilol 3.125 mg p.o. twice daily at home.  Continue to hold home carvedilol until follows up with PCP/cardiology given significant orthostatic hypotension during hospitalization.  Continue aspirin and statin.  Dyslipidemia Crestor 40 mg p.o. daily, Vascepa 1g TID  Anxiety/depression Paroxetine 60 mg p.o. daily, Alprazolam 1 mg every morning  GERD: Continue Protonix 40 mg p.o. daily  OSA: CPAP nightly  Weakness/deconditioning/debility: Patient was seen by PT/OT during hospitalization with recommendations for home health.  Patient declined home health services.  Discharge Diagnoses:  Principal Problem:   Pneumonia due to COVID-19 virus Active Problems:   Acute hypoxemic respiratory failure [facility](HCC)    Discharge Instructions  Discharge Instructions    Call MD for:  difficulty breathing, headache or visual disturbances   Complete by: As directed    Call MD for:  extreme fatigue   Complete by: As directed    Call MD for:  persistant dizziness or light-headedness   Complete by: As directed    Call MD for:  persistant nausea and vomiting   Complete by: As directed    Call MD for:  severe uncontrolled pain   Complete by: As directed    Call MD for:  temperature >100.4   Complete by: As directed    Diet - low sodium heart healthy   Complete by: As directed    Increase activity slowly   Complete by: As directed      Allergies as of 02/18/2020      Reactions   Azithromycin Diarrhea   Penicillins Hives, Swelling   Has patient had a PCN reaction causing immediate rash, facial/tongue/throat swelling, SOB or lightheadedness with hypotension: Yes Has patient had a PCN reaction causing severe rash involving mucus membranes or skin necrosis:  No Has patient had a PCN reaction that required hospitalization No Has patient had a PCN reaction occurring within the last 10 years: No If all of the above answers are "NO", then may proceed with Cephalosporin use.      Medication List    STOP taking these medications   carvedilol 3.125 MG tablet Commonly known as: COREG     TAKE these medications   albuterol 108 (90 Base) MCG/ACT inhaler Commonly known as: VENTOLIN HFA Inhale 2 puffs into the lungs every 6 (six) hours as needed for wheezing or shortness of breath.   ALPRAZolam 1 MG 24 hr tablet Commonly known as: XANAX XR Take 1 tablet (1 mg total) by mouth every morning.   ascorbic acid 500 MG tablet Commonly known as: VITAMIN C Take 1 tablet (500 mg total) by mouth daily. Start taking on: February 19, 2020   aspirin EC 81 MG tablet Take 81 mg by mouth daily.   cyclobenzaprine 10 MG tablet Commonly known as: FLEXERIL Take 1 tablet (10 mg total) by mouth every evening.   Dapsone 7.5 % Gel Commonly known as: Aczone Apply a thin coat to the face BID   guaiFENesin-dextromethorphan 100-10 MG/5ML syrup Commonly known as: ROBITUSSIN DM Take 10 mLs by mouth every 4 (four) hours as needed for cough.   icosapent Ethyl 1 g capsule Commonly known as: VASCEPA TAKE 1 CAPSULE BY MOUTH EVERY MORNING, AT NOON, AND AT BEDTIME   lamoTRIgine 25 MG tablet Commonly known as: LAMICTAL Take by mouth.   pantoprazole 40 MG tablet Commonly known as: PROTONIX Take 1 tablet (40 mg total) by mouth every morning.  PARoxetine 20 MG tablet Commonly known as: PAXIL Take 3 tablets (60 mg total) by mouth daily.   predniSONE 10 MG tablet Commonly known as: DELTASONE Take 4 tablets (40 mg total) by mouth daily for 2 days, THEN 3 tablets (30 mg total) daily for 2 days, THEN 2 tablets (20 mg total) daily for 2 days, THEN 1 tablet (10 mg total) daily for 2 days. Start taking on: February 19, 2020   rosuvastatin 40 MG tablet Commonly known as:  CRESTOR Take 1 tablet (40 mg total) by mouth daily.   Ubrelvy 100 MG Tabs Generic drug: Ubrogepant Take 100 mg by mouth daily as needed (migraine).   Vitamin D (Ergocalciferol) 1.25 MG (50000 UNIT) Caps capsule Commonly known as: DRISDOL Take 1 capsule (50,000 Units total) by mouth every 7 (seven) days.   zinc sulfate 220 (50 Zn) MG capsule Take 1 capsule (220 mg total) by mouth daily. Start taking on: February 19, 2020   zolpidem 12.5 MG CR tablet Commonly known as: Ambien CR Take 1 tablet (12.5 mg total) by mouth at bedtime as needed for sleep.            Durable Medical Equipment  (From admission, onward)         Start     Ordered   02/17/20 1605  For home use only DME oxygen  Once       Question Answer Comment  Length of Need 12 Months   Mode or (Route) Nasal cannula   Liters per Minute 3   Frequency Continuous (stationary and portable oxygen unit needed)   Oxygen conserving device Yes   Oxygen delivery system Gas      02/17/20 1604          Follow-up Information    Alba Cory, MD. Schedule an appointment as soon as possible for a visit in 1 week(s).   Specialty: Family Medicine Contact information: [address] [place] [address] [phone]              Allergies  Allergen Reactions  . Azithromycin Diarrhea  . Penicillins Hives and Swelling    Has patient had a PCN reaction causing immediate rash, facial/tongue/throat swelling, SOB or lightheadedness with hypotension: Yes Has patient had a PCN reaction causing severe rash involving mucus membranes or skin necrosis: No Has patient had a PCN reaction that required hospitalization No Has patient had a PCN reaction occurring within the last 10 years: No If all of the above answers are "NO", then may proceed with Cephalosporin use.    Consultations:  None   Procedures/Studies: DG Chest 2 View  Result Date: 02/04/2020 CLINICAL DATA:  COVID-19 positivity with congestion,  initial encounter EXAM: CHEST - 2 VIEW COMPARISON:  09/30/2018 FINDINGS: Cardiac shadow is within normal limits. Patchy opacity is noted in the right base may be related to the given clinical history of a may simply represent atelectasis. No bony abnormality is seen. No effusion is noted. IMPRESSION: Minimal airspace opacity in the right base as described. Electronically Signed   By: Alcide Clever M.D.   On: 02/04/2020 14:26   CT Angio Chest PE W and/or Wo Contrast  Result Date: 02/07/2020 CLINICAL DATA:  COVID-19 positive 2 days ago, short of breath after antibody infusion, lower abdominal pain, positive D-dimer EXAM: CT ANGIOGRAPHY CHEST CT ABDOMEN AND PELVIS WITH CONTRAST TECHNIQUE: Multidetector CT imaging of the chest was performed using the standard protocol during bolus administration of intravenous contrast. Multiplanar CT image reconstructions and  MIPs were obtained to evaluate the vascular anatomy. Multidetector CT imaging of the abdomen and pelvis was performed using the standard protocol during bolus administration of intravenous contrast. CONTRAST:  41mL OMNIPAQUE IOHEXOL 350 MG/ML SOLN COMPARISON:  02/07/2020, 05/05/2019 FINDINGS: CTA CHEST FINDINGS Cardiovascular: There is suboptimal contrast enhancement of the pulmonary vasculature, resulting in a nondiagnostic evaluation for pulmonary embolus. The majority of the contrast is within the systemic arterial system, and the thoracic aorta is normal in appearance. Heart is unremarkable without pericardial effusion. Mediastinum/Nodes: No enlarged mediastinal, hilar, or axillary lymph nodes. Thyroid gland, trachea, and esophagus demonstrate no significant findings. Lungs/Pleura: Multifocal bilateral ground-glass airspace disease consistent with COVID 19 pneumonia. No effusion or pneumothorax. The central airways are patent. Musculoskeletal: No acute or destructive bony lesions. Reconstructed images demonstrate no additional findings. Review of the MIP  images confirms the above findings. CT ABDOMEN and PELVIS FINDINGS Hepatobiliary: No focal liver abnormality is seen. Status post cholecystectomy. No biliary dilatation. Pancreas: Stable cysts within the uncinate process of the pancreas measuring 1.6 x 1.0 cm, previously shown to reflect a serous cystadenoma. The remainder of the pancreas is unremarkable. Spleen: Normal in size without focal abnormality. Adrenals/Urinary Tract: The kidneys enhance normally and symmetrically. No evidence of obstructive uropathy or urinary calculus. Bladder is unremarkable. The adrenals are normal. Stomach/Bowel: No bowel obstruction or ileus. Normal appendix. No bowel wall thickening or inflammatory changes. Vascular/Lymphatic: Left-sided IVC again noted, an anatomic variant. No significant atherosclerosis. No pathologic adenopathy. Reproductive: Status post hysterectomy. No adnexal masses. Other: There is trace pelvic free fluid, nonspecific. No free intraperitoneal gas. No abdominal wall hernia. Musculoskeletal: No acute bony abnormalities. Reconstructed images demonstrate no additional findings. Review of the MIP images confirms the above findings. IMPRESSION: 1. Nondiagnostic evaluation of the pulmonary vasculature due to timing of contrast bolus. Pulmonary emboli cannot be excluded. 2. Multifocal bilateral airspace disease consistent with COVID 19 pneumonia. 3. Trace pelvic free fluid, likely physiologic. 4. Stable pancreatic cyst, previously shown to represent a serous cystadenoma. 5. Otherwise no acute intra-abdominal or intrapelvic process. Electronically Signed   By: Sharlet Salina M.D.   On: 02/07/2020 21:56   NM Pulmonary Perfusion  Result Date: 02/08/2020 CLINICAL DATA:  49 year old female with elevated D-dimer. Positive COVID-19. EXAM: NUCLEAR MEDICINE PERFUSION LUNG SCAN TECHNIQUE: Perfusion images were obtained in multiple projections after intravenous injection of radiopharmaceutical. Ventilation scans  intentionally deferred if perfusion scan and chest x-ray adequate for interpretation during COVID 19 epidemic. RADIOPHARMACEUTICALS:  3.934 mCi Tc-58m MAA IV COMPARISON:  Chest radiograph dated 02/07/2020 and CT dated 02/07/2020 FINDINGS: Small areas of peripheral heterogeneity and defect primarily in the periphery and subpleural lung corresponds to the opacity seen on the chest radiograph. No large or segmental perfusion defect identified. IMPRESSION: Low probability for pulmonary embolism. Electronically Signed   By: Elgie Collard M.D.   On: 02/08/2020 17:44   CT ABDOMEN PELVIS W CONTRAST  Result Date: 02/07/2020 CLINICAL DATA:  COVID-19 positive 2 days ago, short of breath after antibody infusion, lower abdominal pain, positive D-dimer EXAM: CT ANGIOGRAPHY CHEST CT ABDOMEN AND PELVIS WITH CONTRAST TECHNIQUE: Multidetector CT imaging of the chest was performed using the standard protocol during bolus administration of intravenous contrast. Multiplanar CT image reconstructions and MIPs were obtained to evaluate the vascular anatomy. Multidetector CT imaging of the abdomen and pelvis was performed using the standard protocol during bolus administration of intravenous contrast. CONTRAST:  41mL OMNIPAQUE IOHEXOL 350 MG/ML SOLN COMPARISON:  02/07/2020, 05/05/2019 FINDINGS: CTA CHEST FINDINGS Cardiovascular: There  is suboptimal contrast enhancement of the pulmonary vasculature, resulting in a nondiagnostic evaluation for pulmonary embolus. The majority of the contrast is within the systemic arterial system, and the thoracic aorta is normal in appearance. Heart is unremarkable without pericardial effusion. Mediastinum/Nodes: No enlarged mediastinal, hilar, or axillary lymph nodes. Thyroid gland, trachea, and esophagus demonstrate no significant findings. Lungs/Pleura: Multifocal bilateral ground-glass airspace disease consistent with COVID 19 pneumonia. No effusion or pneumothorax. The central airways are patent.  Musculoskeletal: No acute or destructive bony lesions. Reconstructed images demonstrate no additional findings. Review of the MIP images confirms the above findings. CT ABDOMEN and PELVIS FINDINGS Hepatobiliary: No focal liver abnormality is seen. Status post cholecystectomy. No biliary dilatation. Pancreas: Stable cysts within the uncinate process of the pancreas measuring 1.6 x 1.0 cm, previously shown to reflect a serous cystadenoma. The remainder of the pancreas is unremarkable. Spleen: Normal in size without focal abnormality. Adrenals/Urinary Tract: The kidneys enhance normally and symmetrically. No evidence of obstructive uropathy or urinary calculus. Bladder is unremarkable. The adrenals are normal. Stomach/Bowel: No bowel obstruction or ileus. Normal appendix. No bowel wall thickening or inflammatory changes. Vascular/Lymphatic: Left-sided IVC again noted, an anatomic variant. No significant atherosclerosis. No pathologic adenopathy. Reproductive: Status post hysterectomy. No adnexal masses. Other: There is trace pelvic free fluid, nonspecific. No free intraperitoneal gas. No abdominal wall hernia. Musculoskeletal: No acute bony abnormalities. Reconstructed images demonstrate no additional findings. Review of the MIP images confirms the above findings. IMPRESSION: 1. Nondiagnostic evaluation of the pulmonary vasculature due to timing of contrast bolus. Pulmonary emboli cannot be excluded. 2. Multifocal bilateral airspace disease consistent with COVID 19 pneumonia. 3. Trace pelvic free fluid, likely physiologic. 4. Stable pancreatic cyst, previously shown to represent a serous cystadenoma. 5. Otherwise no acute intra-abdominal or intrapelvic process. Electronically Signed   By: Sharlet Salina M.D.   On: 02/07/2020 21:56   US Venous Img Lower Bilateral (DVT)  Result Date: 02/09/2020 CLINICAL DATA:  Bilateral lower extremity pain and edema. Elevated D-dimer. Evaluate for DVT. EXAM: BILATERAL LOWER  EXTREMITY VENOUS DOPPLER ULTRASOUND TECHNIQUE: Gray-scale sonography with graded compression, as well as color Doppler and duplex ultrasound were performed to evaluate the lower extremity deep venous systems from the level of the common femoral vein and including the common femoral, femoral, profunda femoral, popliteal and calf veins including the posterior tibial, peroneal and gastrocnemius veins when visible. The superficial great saphenous vein was also interrogated. Spectral Doppler was utilized to evaluate flow at rest and with distal augmentation maneuvers in the common femoral, femoral and popliteal veins. COMPARISON:  None. FINDINGS: RIGHT LOWER EXTREMITY Common Femoral Vein: No evidence of thrombus. Normal compressibility, respiratory phasicity and response to augmentation. Saphenofemoral Junction: No evidence of thrombus. Normal compressibility and flow on color Doppler imaging. Profunda Femoral Vein: No evidence of thrombus. Normal compressibility and flow on color Doppler imaging. Femoral Vein: No evidence of thrombus. Normal compressibility, respiratory phasicity and response to augmentation. Popliteal Vein: No evidence of thrombus. Normal compressibility, respiratory phasicity and response to augmentation. Calf Veins: No evidence of thrombus. Normal compressibility and flow on color Doppler imaging. Superficial Great Saphenous Vein: No evidence of thrombus. Normal compressibility. Venous Reflux:  None. Other Findings:  None. LEFT LOWER EXTREMITY Common Femoral Vein: No evidence of thrombus. Normal compressibility, respiratory phasicity and response to augmentation. Saphenofemoral Junction: No evidence of thrombus. Normal compressibility and flow on color Doppler imaging. Profunda Femoral Vein: No evidence of thrombus. Normal compressibility and flow on color Doppler imaging. Femoral Vein: No evidence  of thrombus. Normal compressibility, respiratory phasicity and response to augmentation. Popliteal  Vein: No evidence of thrombus. Normal compressibility, respiratory phasicity and response to augmentation. Calf Veins: No evidence of thrombus. Normal compressibility and flow on color Doppler imaging. Superficial Great Saphenous Vein: No evidence of thrombus. Normal compressibility. Venous Reflux:  None. Other Findings:  None. IMPRESSION: No evidence of DVT within either lower extremity. Electronically Signed   By: Simonne Come M.D.   On: 02/09/2020 14:09   DG Chest Portable 1 View  Result Date: 02/07/2020 CLINICAL DATA:  COVID positive with shortness of breath. EXAM: PORTABLE CHEST 1 VIEW COMPARISON:  February 04, 2020 FINDINGS: Mild multifocal infiltrates are seen involving predominantly the periphery of the mid and lower lung fields, bilaterally. There is no evidence of a pleural effusion or pneumothorax. The heart size and mediastinal contours are within normal limits. A radiopaque loop recorder device is seen. The visualized skeletal structures are unremarkable. IMPRESSION: Mild bilateral multifocal infiltrates. Electronically Signed   By: Aram Candela M.D.   On: 02/07/2020 20:06   CUP PACEART REMOTE DEVICE CHECK  Result Date: 02/13/2020 ILR summary report received. Battery status OK. Normal device function. No new symptom, tachy, brady, or pause episodes. No new AF episodes. Monthly summary reports and ROV/PRN.  R. Powers, LPN, CVRS.     Subjective: Patient seen and examined at bedside, resting comfortably.  States that dizziness has resolved when moving from a lying/sitting to standing position.  Continues with mild orthostasis but much improved and patient now asymptomatic.  Patient requesting discharge home.  Will need to continue supplemental oxygen and prednisone taper on discharge.  Discussed with patient to discontinue carvedilol until follows up with PCP or cardiology outpatient.  No other complaints or concerns at this time.  Denies headache, no fever/chills/night sweats, no  nausea/vomiting/diarrhea, no chest pain, palpitations, no abdominal pain, no weakness, no fatigue, no paresthesias.  No acute events overnight per nursing staff.  Discharge Exam: Vitals:   02/18/20 0542 02/18/20 0547  BP: (!) 111/51 (!) 96/53  Pulse: 84 78  Resp:    Temp:    SpO2: 94%    Vitals:   02/18/20 0445 02/18/20 0500 02/18/20 0542 02/18/20 0547  BP: 114/69  (!) 111/51 (!) 96/53  Pulse: 69  84 78  Resp: 18     Temp: 97.6 F (36.4 C)     TempSrc: Oral     SpO2: 92%  94%   Weight:  87.8 kg    Height:        General: Pt is alert, awake, not in acute distress Cardiovascular: RRR, S1/S2 +, no rubs, no gallops Respiratory: CTA bilaterally, no wheezing, no rhonchi, on 3 L nasal cannula Abdominal: Soft, NT, ND, bowel sounds + Extremities: no edema, no cyanosis    The results of significant diagnostics from this hospitalization (including imaging, microbiology, ancillary and laboratory) are listed below for reference.     Microbiology: No results found for this or any previous visit (from the past 240 hour(s)).   Labs: BNP (last 3 results) Recent Labs    02/08/20 0734  BNP 40.7   Basic Metabolic Panel: Recent Labs  Lab 02/12/20 0503 02/17/20 0354  NA 142 142  K 3.7 3.8  CL 101 103  CO2 33* 31  GLUCOSE 126* 108*  BUN 21* 19  CREATININE 0.70 0.77  CALCIUM 8.0* 8.2*   Liver Function Tests: Recent Labs  Lab 02/12/20 0503 02/17/20 0354  AST 30 67*  ALT 30 108*  ALKPHOS 119 85  BILITOT 0.7 0.7  PROT 5.7* 5.3*  ALBUMIN 2.9* 2.7*   No results for input(s): LIPASE, AMYLASE in the last 168 hours. No results for input(s): AMMONIA in the last 168 hours. CBC: Recent Labs  Lab 02/12/20 0503 02/17/20 0354  WBC 6.3 11.9*  NEUTROABS 5.3  --   HGB 11.3* 10.6*  HCT 32.9* 30.9*  MCV 90.9 90.1  PLT 251 324   Cardiac Enzymes: No results for input(s): CKTOTAL, CKMB, CKMBINDEX, TROPONINI in the last 168 hours. BNP: Invalid input(s): POCBNP CBG: Recent  Labs  Lab 02/14/20 1245  GLUCAP 147*   D-Dimer No results for input(s): DDIMER in the last 72 hours. Hgb A1c No results for input(s): HGBA1C in the last 72 hours. Lipid Profile No results for input(s): CHOL, HDL, LDLCALC, TRIG, CHOLHDL, LDLDIRECT in the last 72 hours. Thyroid function studies No results for input(s): TSH, T4TOTAL, T3FREE, THYROIDAB in the last 72 hours.  Invalid input(s): FREET3 Anemia work up No results for input(s): VITAMINB12, FOLATE, FERRITIN, TIBC, IRON, RETICCTPCT in the last 72 hours. Urinalysis    Component Value Date/Time   COLORURINE YELLOW (A) 02/15/2020 1039   APPEARANCEUR CLOUDY (A) 02/15/2020 1039   LABSPEC 1.019 02/15/2020 1039   PHURINE 7.0 02/15/2020 1039   GLUCOSEU NEGATIVE 02/15/2020 1039   HGBUR NEGATIVE 02/15/2020 1039   BILIRUBINUR NEGATIVE 02/15/2020 1039   KETONESUR NEGATIVE 02/15/2020 1039   PROTEINUR NEGATIVE 02/15/2020 1039   NITRITE NEGATIVE 02/15/2020 1039   LEUKOCYTESUR NEGATIVE 02/15/2020 1039   [facility] Invalid input(s): PROCALCITONIN,  WBC,  LACTICIDVEN Microbiology No results found for this or any previous visit (from the past 240 hour(s)).   Time coordinating discharge: Over 30 minutes  SIGNED:   Alvira Philips [place], DO  [facility] 02/18/2020, 9:32 AM

## 2020-02-21 ENCOUNTER — Telehealth: Payer: Self-pay

## 2020-02-21 NOTE — Telephone Encounter (Signed)
Transition Care Management Follow-up Telephone Call  Date of discharge and from where: 02/18/20 [facility]  How have you been since you were released from the hospital? Pt states she is doing well, feeling better; still using 3L continuous O2 but has shortness of breath upon exertion and taking things slow.   Any questions or concerns? Yes - patient states she was increased to lamictal 25 mg BID while in hospital and needs rx sent to [facility] at [place]. Pt is out of medication and requests Dr. Carlynn Purl to refill until she can see Dr. Sherryll Burger. New referral placed to neuro on 02/16/20.   Items Reviewed:  Did the pt receive and understand the discharge instructions provided? Yes   Medications obtained and verified? Yes   Other? No   Any new allergies since your discharge? No   Dietary orders reviewed? Yes  Do you have support at home? Yes   Home Care and Equipment/Supplies: Were home health services ordered? Yes - pt declined Were any new equipment or medical supplies ordered?  Yes: oxygen What is the name of the medical supply agency? [facility] Were you able to get the supplies/equipment? yes Do you have any questions related to the use of the equipment or supplies? No  Functional Questionnaire: (I = Independent and D = Dependent) ADLs: I  Bathing/Dressing- I   Meal Prep- I  Eating- I  Maintaining continence- I  Transferring/Ambulation- I  Managing Meds- I  Follow up appointments reviewed:   PCP Hospital f/u appt confirmed? Yes  Scheduled to see Dr. Carlynn Purl on 02/23/20 @ 8:20.  Specialist Hospital f/u appt confirmed? No  awaiting neuro appt  Are transportation arrangements needed? No   If their condition worsens, is the pt aware to call PCP or go to the Emergency Dept.? Yes  Was the patient provided with contact information for the PCP's office or ED? Yes  Was to pt encouraged to call back with questions or concerns? Yes

## 2020-02-22 ENCOUNTER — Telehealth: Payer: Self-pay | Admitting: Family Medicine

## 2020-02-22 NOTE — Telephone Encounter (Signed)
Pt needs an Rx for oxygen(Inogen One G5) sent asap today to [facility] at [phone]/ please advise

## 2020-02-22 NOTE — Progress Notes (Deleted)
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 [place], [place] [address]   Ferritin     Status: Abnormal   Collection Time: 02/08/20  7:34 AM  Result Value Ref Range   Ferritin 313 (H) 11 - 307 ng/mL    Comment: Performed at [facility], [address] [place], [place] [address]  Lactate dehydrogenase     Status: Abnormal   Collection Time: 02/08/20  7:34 AM  Result Value Ref Range   LDH 306 (H) 98 - 192 U/L    Comment: Performed at [facility], [address] [place], [place] [address]  Procalcitonin     Status: None   Collection Time: 02/08/20  7:34 AM  Result Value Ref  Range   Procalcitonin 1.56 ng/mL    Comment:        Interpretation: PCT > 0.5 ng/mL and <= 2 ng/mL: Systemic infection (sepsis) is possible, but other conditions are known to elevate PCT as well. (NOTE)       Sepsis PCT Algorithm           Lower Respiratory Tract                                      Infection PCT Algorithm    ----------------------------     ----------------------------         PCT < 0.25 ng/mL                PCT < 0.10 ng/mL          Strongly encourage             Strongly discourage   discontinuation of antibiotics    initiation of antibiotics    ----------------------------     -----------------------------       PCT 0.25 - 0.50 ng/mL            PCT 0.10 - 0.25 ng/mL               OR       >80% decrease in PCT            Discourage initiation of                                            antibiotics      Encourage discontinuation           of antibiotics    ----------------------------     -----------------------------         PCT >= 0.50 ng/mL              PCT 0.26 - 0.50 ng/mL                AND       <80% decrease in PCT             Encourage initiation of                                             antibiotics       Encourage continuation  of antibiotics    ----------------------------     -----------------------------        PCT >= 0.50 ng/mL                  PCT > 0.50 ng/mL               AND         increase in PCT                  Strongly encourage                                      initiation of antibiotics    Strongly encourage escalation           of antibiotics                                     -----------------------------                                           PCT <= 0.25 ng/mL                                                 OR                                        > 80% decrease in PCT                                      Discontinue / Do not initiate                                             antibiotics  Performed at  [facility], [address] [place], [place] [address]   Lactic acid, plasma     Status: None   Collection Time: 02/08/20  9:38 AM  Result Value Ref Range   Lactic Acid, Venous 1.2 0.5 - 1.9 mmol/L    Comment: Performed at [facility], [address] [place], [place] [address]  CBC with Differential/Platelet     Status: Abnormal   Collection Time: 02/09/20  4:37 AM  Result Value Ref Range   WBC 3.1 (L) 4.0 - 10.5 K/uL   RBC 3.64 (L) 3.87 - 5.11 MIL/uL   Hemoglobin 11.0 (L) 12.0 - 15.0 g/dL   HCT 56.232.8 (L) 13.036.0 - 86.546.0 %   MCV 90.1 80.0 - 100.0 fL   MCH 30.2 26.0 - 34.0 pg   MCHC 33.5 30.0 - 36.0 g/dL   RDW 78.413.4 69.611.5 - 29.515.5 %   Platelets 177 150 - 400 K/uL   nRBC 0.0 0.0 - 0.2 %   Neutrophils Relative % 77 %   Neutro Abs 2.4 1.7 - 7.7 K/uL   Lymphocytes Relative  20 %   Lymphs Abs 0.6 (L) 0.7 - 4.0 K/uL   Monocytes Relative 3 %   Monocytes Absolute 0.1 0.1 - 1.0 K/uL   Eosinophils Relative 0 %   Eosinophils Absolute 0.0 0.0 - 0.5 K/uL   Basophils Relative 0 %   Basophils Absolute 0.0 0.0 - 0.1 K/uL   Immature Granulocytes 0 %   Abs Immature Granulocytes 0.01 0.00 - 0.07 K/uL    Comment: Performed at [facility], [address] [place], [place] [address]  Comprehensive metabolic panel     Status: Abnormal   Collection Time: 02/09/20  4:37 AM  Result Value Ref Range   Sodium 139 135 - 145 mmol/L   Potassium 3.9 3.5 - 5.1 mmol/L   Chloride 106 98 - 111 mmol/L   CO2 25 22 - 32 mmol/L   Glucose, Bld 176 (H) 70 - 99 mg/dL    Comment: Glucose reference range applies only to samples taken after fasting for at least 8 hours.   BUN 12 6 - 20 mg/dL   Creatinine, Ser 1.06 0.44 - 1.00 mg/dL   Calcium 7.7 (L) 8.9 - 10.3 mg/dL   Total Protein 5.5 (L) 6.5 - 8.1 g/dL   Albumin 2.7 (L) 3.5 - 5.0 g/dL   AST 44 (H) 15 - 41 U/L   ALT 37 0 - 44 U/L   Alkaline Phosphatase 163 (H) 38 - 126 U/L   Total Bilirubin 0.6 0.3 - 1.2 mg/dL   GFR, Estimated >26 >94  mL/min    Comment: (NOTE) Calculated using the CKD-EPI Creatinine Equation (2021)    Anion gap 8 5 - 15    Comment: Performed at [facility], [address] [place], [place] [address]  C-reactive protein     Status: Abnormal   Collection Time: 02/09/20  4:37 AM  Result Value Ref Range   CRP 8.4 (H) <1.0 mg/dL    Comment: Performed at [facility], [address], [place], [place] [address]  Fibrin derivatives D-Dimer [facility] only)     Status: Abnormal   Collection Time: 02/09/20  4:37 AM  Result Value Ref Range   Fibrin derivatives D-dimer ([facility]) 889.63 (H) 0.00 - 499.00 ng/mL (FEU)    Comment: (NOTE) <> Exclusion of Venous Thromboembolism (VTE) - OUTPATIENT ONLY   (Emergency Department or [place])    0-499 ng/ml (FEU): With a low to intermediate pretest probability                      for VTE this test result excludes the diagnosis                      of VTE.   >499 ng/ml (FEU) : VTE not excluded; additional work up for VTE is                      required.  <> Testing on Inpatients and Evaluation of Disseminated Intravascular   Coagulation (DIC) Reference Range:   0-499 ng/ml (FEU) Performed at [facility], [address] [place], [place] [address]   Ferritin     Status: Abnormal   Collection Time: 02/09/20  4:37 AM  Result Value Ref Range   Ferritin 388 (H) 11 - 307 ng/mL    Comment: Performed at [facility], [address], [place], [place] [address]  CBC with Differential/Platelet     Status: Abnormal   Collection Time: 02/10/20  5:29 AM  Result Value Ref Range   WBC 6.8 4.0 - 10.5 K/uL   RBC 3.61 (L) 3.87 - 5.11 MIL/uL   Hemoglobin 11.2 (L) 12.0 - 15.0 g/dL   HCT 86.5 (L) 78.4 - 69.6 %   MCV 90.9 80.0 - 100.0 fL   MCH 31.0 26.0 - 34.0 pg   MCHC 34.1 30.0 - 36.0 g/dL   RDW 29.5 28.4 - 13.2 %   Platelets 236 150 - 400 K/uL   nRBC 0.0 0.0 - 0.2 %   Neutrophils Relative % 84 %   Neutro Abs 5.8 1.7 - 7.7 K/uL   Lymphocytes  Relative 10 %   Lymphs Abs 0.7 0.7 - 4.0 K/uL   Monocytes Relative 5 %   Monocytes Absolute 0.4 0.1 - 1.0 K/uL   Eosinophils Relative 0 %   Eosinophils Absolute 0.0 0.0 - 0.5 K/uL   Basophils Relative 0 %   Basophils Absolute 0.0 0.0 - 0.1 K/uL   Immature Granulocytes 1 %   Abs Immature Granulocytes 0.05 0.00 - 0.07 K/uL    Comment: Performed at [facility], [address] [place], [place] [address]  Comprehensive metabolic panel     Status: Abnormal   Collection Time: 02/10/20  5:29 AM  Result Value Ref Range   Sodium 142 135 - 145 mmol/L   Potassium 4.0 3.5 - 5.1 mmol/L   Chloride 107 98 - 111 mmol/L   CO2 27 22 - 32 mmol/L   Glucose, Bld 178 (H) 70 - 99 mg/dL    Comment: Glucose reference range applies only to samples taken after fasting for at least 8 hours.   BUN 19 6 - 20 mg/dL   Creatinine, Ser 2.72 0.44 - 1.00 mg/dL   Calcium 8.2 (L) 8.9 - 10.3 mg/dL   Total Protein 5.7 (L) 6.5 - 8.1 g/dL   Albumin 2.9 (L) 3.5 - 5.0 g/dL   AST 26 15 - 41 U/L   ALT 30 0 - 44 U/L   Alkaline Phosphatase 141 (H) 38 - 126 U/L   Total Bilirubin 0.5 0.3 - 1.2 mg/dL   GFR, Estimated >53 >66 mL/min    Comment: (NOTE) Calculated using the CKD-EPI Creatinine Equation (2021)    Anion gap 8 5 - 15    Comment: Performed at [facility], [address] [place], [place] [address]  C-reactive protein     Status: Abnormal   Collection Time: 02/10/20  5:29 AM  Result Value Ref Range   CRP 2.9 (H) <1.0 mg/dL    Comment: Performed at [facility], [address] [place], [place] [address]  Fibrin derivatives D-Dimer [facility] only)     Status: Abnormal   Collection Time: 02/10/20  5:29 AM  Result Value Ref Range   Fibrin derivatives D-dimer ([facility]) 833.96 (H) 0.00 - 499.00 ng/mL (FEU)    Comment: (NOTE) <> Exclusion of Venous Thromboembolism (VTE) - OUTPATIENT ONLY   (Emergency Department or [place])    0-499 ng/ml (FEU): With a low to intermediate pretest probability                       for VTE this test result excludes the diagnosis                      of VTE.   >499 ng/ml (FEU) : VTE not excluded; additional work up for VTE is  required.  <> Testing on Inpatients and Evaluation of Disseminated Intravascular   Coagulation (DIC) Reference Range:   0-499 ng/ml (FEU) Performed at [facility], [address] [place], [place] [address]   Ferritin     Status: None   Collection Time: 02/10/20  5:29 AM  Result Value Ref Range   Ferritin 305 11 - 307 ng/mL    Comment: Performed at [facility], [address] [place], [place] [address]  CBC with Differential/Platelet     Status: Abnormal   Collection Time: 02/11/20  5:45 AM  Result Value Ref Range   WBC 7.3 4.0 - 10.5 K/uL   RBC 3.79 (L) 3.87 - 5.11 MIL/uL   Hemoglobin 11.8 (L) 12.0 - 15.0 g/dL   HCT 09.8 (L) 11.9 - 14.7 %   MCV 91.3 80.0 - 100.0 fL   MCH 31.1 26.0 - 34.0 pg   MCHC 34.1 30.0 - 36.0 g/dL   RDW 82.9 56.2 - 13.0 %   Platelets 256 150 - 400 K/uL   nRBC 0.0 0.0 - 0.2 %   Neutrophils Relative % 82 %   Neutro Abs 6.0 1.7 - 7.7 K/uL   Lymphocytes Relative 11 %   Lymphs Abs 0.8 0.7 - 4.0 K/uL   Monocytes Relative 5 %   Monocytes Absolute 0.4 0.1 - 1.0 K/uL   Eosinophils Relative 0 %   Eosinophils Absolute 0.0 0.0 - 0.5 K/uL   Basophils Relative 0 %   Basophils Absolute 0.0 0.0 - 0.1 K/uL   Immature Granulocytes 2 %   Abs Immature Granulocytes 0.11 (H) 0.00 - 0.07 K/uL    Comment: Performed at [facility], [address] [place], [place] [address]  Comprehensive metabolic panel     Status: Abnormal   Collection Time: 02/11/20  5:45 AM  Result Value Ref Range   Sodium 144 135 - 145 mmol/L   Potassium 3.5 3.5 - 5.1 mmol/L   Chloride 105 98 - 111 mmol/L   CO2 32 22 - 32 mmol/L   Glucose, Bld 105 (H) 70 - 99 mg/dL    Comment: Glucose reference range applies only to samples taken after fasting for at least 8 hours.   BUN 25 (H) 6 - 20  mg/dL   Creatinine, Ser 4.69 0.44 - 1.00 mg/dL   Calcium 8.5 (L) 8.9 - 10.3 mg/dL   Total Protein 5.8 (L) 6.5 - 8.1 g/dL   Albumin 3.0 (L) 3.5 - 5.0 g/dL   AST 30 15 - 41 U/L   ALT 29 0 - 44 U/L   Alkaline Phosphatase 132 (H) 38 - 126 U/L   Total Bilirubin 0.7 0.3 - 1.2 mg/dL   GFR, Estimated >62 >95 mL/min    Comment: (NOTE) Calculated using the CKD-EPI Creatinine Equation (2021)    Anion gap 7 5 - 15    Comment: Performed at [facility], [address] [place], [place] [address]  C-reactive protein     Status: Abnormal   Collection Time: 02/11/20  5:45 AM  Result Value Ref Range   CRP 1.6 (H) <1.0 mg/dL    Comment: Performed at [facility], [address], [place], [place] [address]  Fibrin derivatives D-Dimer [facility] only)     Status: Abnormal   Collection Time: 02/11/20  5:45 AM  Result Value Ref Range   Fibrin derivatives D-dimer ([facility]) 882.52 (H) 0.00 - 499.00 ng/mL (FEU)    Comment: (NOTE) <> Exclusion of Venous Thromboembolism (VTE) -  OUTPATIENT ONLY   (Emergency Department or [place])    0-499 ng/ml (FEU): With a low to intermediate pretest probability                      for VTE this test result excludes the diagnosis                      of VTE.   >499 ng/ml (FEU) : VTE not excluded; additional work up for VTE is                      required.  <> Testing on Inpatients and Evaluation of Disseminated Intravascular   Coagulation (DIC) Reference Range:   0-499 ng/ml (FEU) Performed at [facility], [address] [place], [place] [address]   Ferritin     Status: None   Collection Time: 02/11/20  5:45 AM  Result Value Ref Range   Ferritin 251 11 - 307 ng/mL    Comment: Performed at [facility], [address] [place], [place] [address]  CUP PACEART REMOTE DEVICE CHECK     Status: None   Collection Time: 02/11/20 11:37 PM  Result Value Ref Range   Date Time Interrogation Session [phone]    Pulse Generator  Manufacturer MERM    Pulse Gen Model [id number] Reveal LINQ    Pulse Gen Serial Number [id number]    Clinic Name [facility]    Implantable Pulse Generator Type ICM/ILR    Implantable Pulse Generator Implant Date [id number]   CBC with Differential/Platelet     Status: Abnormal   Collection Time: 02/12/20  5:03 AM  Result Value Ref Range   WBC 6.3 4.0 - 10.5 K/uL   RBC 3.62 (L) 3.87 - 5.11 MIL/uL   Hemoglobin 11.3 (L) 12.0 - 15.0 g/dL   HCT 32.9 (L) 36.0 - 46.0 %   MCV 90.9 80.0 - 100.0 fL   MCH 31.2 26.0 - 34.0 pg   MCHC 34.3 30.0 - 36.0 g/dL   RDW 13.1 11.5 - 15.5 %   Platelets 251 150 - 400 K/uL   nRBC 0.0 0.0 - 0.2 %   Neutrophils Relative % 84 %   Neutro Abs 5.3 1.7 - 7.7 K/uL   Lymphocytes Relative 10 %   Lymphs Abs 0.7 0.7 - 4.0 K/uL   Monocytes Relative 5 %   Monocytes Absolute 0.3 0.1 - 1.0 K/uL   Eosinophils Relative 0 %   Eosinophils Absolute 0.0 0.0 - 0.5 K/uL   Basophils Relative 0 %   Basophils Absolute 0.0 0.0 - 0.1 K/uL   Immature Granulocytes 1 %   Abs Immature Granulocytes 0.09 (H) 0.00 - 0.07 K/uL    Comment: Performed at [facility], [place] [place], [place] [address]  Comprehensive metabolic panel     Status: Abnormal   Collection Time: 02/12/20  5:03 AM  Result Value Ref Range   Sodium 142 135 - 145 mmol/L   Potassium 3.7 3.5 - 5.1 mmol/L   Chloride 101 98 - 111 mmol/L   CO2 33 (H) 22 - 32 mmol/L   Glucose, Bld 126 (H) 70 - 99 mg/dL    Comment: Glucose reference range applies only to samples taken after fasting for at least 8 hours.   BUN 21 (H) 6 - 20 mg/dL   Creatinine, Ser 0.70 0.44 - 1.00 mg/dL   Calcium 8.0 (L) 8.9 - 10.3 mg/dL   Total  Protein 5.7 (L) 6.5 - 8.1 g/dL   Albumin 2.9 (L) 3.5 - 5.0 g/dL   AST 30 15 - 41 U/L   ALT 30 0 - 44 U/L   Alkaline Phosphatase 119 38 - 126 U/L   Total Bilirubin 0.7 0.3 - 1.2 mg/dL   GFR, Estimated >16 >10 mL/min    Comment: (NOTE) Calculated using the CKD-EPI Creatinine Equation (2021)     Anion gap 8 5 - 15    Comment: Performed at [facility], [address] [place], [place] [address]  C-reactive protein     Status: Abnormal   Collection Time: 02/12/20  5:03 AM  Result Value Ref Range   CRP 7.1 (H) <1.0 mg/dL    Comment: Performed at [facility], [address], [place], [place] [address]  Fibrin derivatives D-Dimer [facility] only)     Status: Abnormal   Collection Time: 02/12/20  5:03 AM  Result Value Ref Range   Fibrin derivatives D-dimer ([facility]) 933.58 (H) 0.00 - 499.00 ng/mL (FEU)    Comment: (NOTE) <> Exclusion of Venous Thromboembolism (VTE) - OUTPATIENT ONLY   (Emergency Department or [place])    0-499 ng/ml (FEU): With a low to intermediate pretest probability                      for VTE this test result excludes the diagnosis                      of VTE.   >499 ng/ml (FEU) : VTE not excluded; additional work up for VTE is                      required.  <> Testing on Inpatients and Evaluation of Disseminated Intravascular   Coagulation (DIC) Reference Range:   0-499 ng/ml (FEU) Performed at [facility], [address] [place], [place] [address]   Ferritin     Status: None   Collection Time: 02/12/20  5:03 AM  Result Value Ref Range   Ferritin 237 11 - 307 ng/mL    Comment: Performed at [facility], [address] [place], [place] [address]  Glucose, capillary     Status: Abnormal   Collection Time: 02/14/20 12:45 PM  Result Value Ref Range   Glucose-Capillary 147 (H) 70 - 99 mg/dL    Comment: Glucose reference range applies only to samples taken after fasting for at least 8 hours.  Urinalysis, Routine w reflex microscopic     Status: Abnormal   Collection Time: 02/15/20 10:39 AM  Result Value Ref Range   Color, Urine YELLOW (A) YELLOW   APPearance CLOUDY (A) CLEAR   Specific Gravity, Urine 1.019 1.005 - 1.030   pH 7.0 5.0 - 8.0   Glucose, UA NEGATIVE NEGATIVE mg/dL   Hgb urine dipstick NEGATIVE NEGATIVE    Bilirubin Urine NEGATIVE NEGATIVE   Ketones, ur NEGATIVE NEGATIVE mg/dL   Protein, ur NEGATIVE NEGATIVE mg/dL   Nitrite NEGATIVE NEGATIVE   Leukocytes,Ua NEGATIVE NEGATIVE    Comment: Performed at [facility], [address] [place], [place] [address]  CBC     Status: Abnormal   Collection Time: 02/17/20  3:54 AM  Result Value Ref Range   WBC 11.9 (H) 4.0 - 10.5 K/uL   RBC 3.43 (L) 3.87 - 5.11 MIL/uL   Hemoglobin 10.6 (L) 12.0 - 15.0 g/dL   HCT 65.7 (L) 84.6 - 96.2 %  MCV 90.1 80.0 - 100.0 fL   MCH 30.9 26.0 - 34.0 pg   MCHC 34.3 30.0 - 36.0 g/dL   RDW 36.6 44.0 - 34.7 %   Platelets 324 150 - 400 K/uL   nRBC 0.0 0.0 - 0.2 %    Comment: Performed at [facility], [address] [place], [place] [address]  Comprehensive metabolic panel     Status: Abnormal   Collection Time: 02/17/20  3:54 AM  Result Value Ref Range   Sodium 142 135 - 145 mmol/L   Potassium 3.8 3.5 - 5.1 mmol/L   Chloride 103 98 - 111 mmol/L   CO2 31 22 - 32 mmol/L   Glucose, Bld 108 (H) 70 - 99 mg/dL    Comment: Glucose reference range applies only to samples taken after fasting for at least 8 hours.   BUN 19 6 - 20 mg/dL   Creatinine, Ser 6.38 0.44 - 1.00 mg/dL   Calcium 8.2 (L) 8.9 - 10.3 mg/dL   Total Protein 5.3 (L) 6.5 - 8.1 g/dL   Albumin 2.7 (L) 3.5 - 5.0 g/dL   AST 67 (H) 15 - 41 U/L   ALT 108 (H) 0 - 44 U/L   Alkaline Phosphatase 85 38 - 126 U/L   Total Bilirubin 0.7 0.3 - 1.2 mg/dL   GFR, Estimated >75 >64 mL/min    Comment: (NOTE) Calculated using the CKD-EPI Creatinine Equation (2021)    Anion gap 8 5 - 15    Comment: Performed at [facility], [address] [place], [place] [address]  Fibrin derivatives D-Dimer [facility] only)     Status: Abnormal   Collection Time: 02/17/20  3:54 AM  Result Value Ref Range   Fibrin derivatives D-dimer ([facility]) 758.96 (H) 0.00 - 499.00 ng/mL (FEU)    Comment: (NOTE) <> Exclusion of Venous Thromboembolism (VTE) - OUTPATIENT  ONLY   (Emergency Department or [place])    0-499 ng/ml (FEU): With a low to intermediate pretest probability                      for VTE this test result excludes the diagnosis                      of VTE.   >499 ng/ml (FEU) : VTE not excluded; additional work up for VTE is                      required.  <> Testing on Inpatients and Evaluation of Disseminated Intravascular   Coagulation (DIC) Reference Range:   0-499 ng/ml (FEU) Performed at [facility], [address] [place], [place] [address]   C-reactive protein     Status: None   Collection Time: 02/17/20  3:54 AM  Result Value Ref Range   CRP 0.6 <1.0 mg/dL    Comment: Performed at [facility], [address], [place], [place] [address]    Diabetic Foot Exam: Diabetic Foot Exam - Simple   No data filed    ***  PHQ2/9: Depression screen [facility] 2/9 02/04/2020 01/04/2020 01/04/2020 10/15/2019 07/22/2019  Decreased Interest 0 1 1 1  0  Down, Depressed, Hopeless 0 1 1 1  0  PHQ - 2 Score 0 2 2 2  0  Altered sleeping 3 0 - 0 2  Tired, decreased energy 3 1 - 1 0  Change in appetite 3 0 - 1 0  Feeling bad or failure  about yourself  0 1 - 0 0  Trouble concentrating 0 0 - 0 0  Moving slowly or fidgety/restless 0 0 - 0 0  Suicidal thoughts 0 0 - 0 0  PHQ-9 Score 9 4 - 4 2  Difficult doing work/chores Somewhat difficult Somewhat difficult - Not difficult at all Not difficult at all  Some recent data might be hidden    phq 9 is {gen pos [id number]} ***  Fall Risk: Fall Risk  02/04/2020 01/04/2020 10/15/2019 07/22/2019 04/28/2019  Falls in the past year? 0 1 1 0 0  Number falls in past yr: 0 0 1 - 0  Injury with Fall? 0 0 0 - 0  Comment - - - - -  Risk for fall due to : - - - - -  Follow up - - - - -   ***   Functional Status Survey:   ***   Assessment & Plan  *** There are no diagnoses linked to this encounter.

## 2020-02-23 ENCOUNTER — Inpatient Hospital Stay
Admission: EM | Admit: 2020-02-23 | Discharge: 2020-02-24 | DRG: 194 | Disposition: A | Discharge status: Home / Self Care | Payer: BC Managed Care – PPO | Attending: Internal Medicine | Admitting: Internal Medicine

## 2020-02-23 ENCOUNTER — Ambulatory Visit: Payer: BC Managed Care – PPO | Admitting: Family Medicine

## 2020-02-23 ENCOUNTER — Telehealth: Payer: Self-pay | Admitting: Family Medicine

## 2020-02-23 ENCOUNTER — Other Ambulatory Visit: Payer: Self-pay

## 2020-02-23 ENCOUNTER — Emergency Department: Payer: BC Managed Care – PPO

## 2020-02-23 ENCOUNTER — Encounter: Payer: Self-pay | Admitting: Emergency Medicine

## 2020-02-23 DIAGNOSIS — N179 Acute kidney failure, unspecified: Secondary | ICD-10-CM | POA: Diagnosis not present

## 2020-02-23 DIAGNOSIS — U099 Post covid-19 condition, unspecified: Secondary | ICD-10-CM | POA: Diagnosis present

## 2020-02-23 DIAGNOSIS — I951 Orthostatic hypotension: Secondary | ICD-10-CM | POA: Diagnosis not present

## 2020-02-23 DIAGNOSIS — F330 Major depressive disorder, recurrent, mild: Secondary | ICD-10-CM | POA: Diagnosis not present

## 2020-02-23 DIAGNOSIS — G43909 Migraine, unspecified, not intractable, without status migrainosus: Secondary | ICD-10-CM | POA: Diagnosis present

## 2020-02-23 DIAGNOSIS — G4733 Obstructive sleep apnea (adult) (pediatric): Secondary | ICD-10-CM | POA: Diagnosis not present

## 2020-02-23 DIAGNOSIS — I10 Essential (primary) hypertension: Secondary | ICD-10-CM | POA: Diagnosis present

## 2020-02-23 DIAGNOSIS — F329 Major depressive disorder, single episode, unspecified: Secondary | ICD-10-CM | POA: Diagnosis not present

## 2020-02-23 DIAGNOSIS — E785 Hyperlipidemia, unspecified: Secondary | ICD-10-CM | POA: Diagnosis not present

## 2020-02-23 DIAGNOSIS — R0789 Other chest pain: Secondary | ICD-10-CM | POA: Diagnosis present

## 2020-02-23 DIAGNOSIS — U071 COVID-19: Secondary | ICD-10-CM | POA: Diagnosis not present

## 2020-02-23 DIAGNOSIS — Z79899 Other long term (current) drug therapy: Secondary | ICD-10-CM | POA: Diagnosis not present

## 2020-02-23 DIAGNOSIS — R091 Pleurisy: Secondary | ICD-10-CM | POA: Diagnosis present

## 2020-02-23 DIAGNOSIS — Z880 Allergy status to penicillin: Secondary | ICD-10-CM

## 2020-02-23 DIAGNOSIS — J159 Unspecified bacterial pneumonia: Secondary | ICD-10-CM | POA: Diagnosis not present

## 2020-02-23 DIAGNOSIS — Z8249 Family history of ischemic heart disease and other diseases of the circulatory system: Secondary | ICD-10-CM

## 2020-02-23 DIAGNOSIS — E860 Dehydration: Secondary | ICD-10-CM | POA: Diagnosis not present

## 2020-02-23 DIAGNOSIS — Z9049 Acquired absence of other specified parts of digestive tract: Secondary | ICD-10-CM | POA: Diagnosis not present

## 2020-02-23 DIAGNOSIS — R4020 Unspecified coma: Secondary | ICD-10-CM | POA: Diagnosis not present

## 2020-02-23 DIAGNOSIS — R000 Tachycardia, unspecified: Secondary | ICD-10-CM | POA: Diagnosis not present

## 2020-02-23 DIAGNOSIS — Z83438 Family history of other disorder of lipoprotein metabolism and other lipidemia: Secondary | ICD-10-CM

## 2020-02-23 DIAGNOSIS — Z7982 Long term (current) use of aspirin: Secondary | ICD-10-CM

## 2020-02-23 DIAGNOSIS — K219 Gastro-esophageal reflux disease without esophagitis: Secondary | ICD-10-CM | POA: Diagnosis present

## 2020-02-23 DIAGNOSIS — I959 Hypotension, unspecified: Secondary | ICD-10-CM

## 2020-02-23 DIAGNOSIS — F419 Anxiety disorder, unspecified: Secondary | ICD-10-CM | POA: Diagnosis not present

## 2020-02-23 DIAGNOSIS — R079 Chest pain, unspecified: Secondary | ICD-10-CM | POA: Diagnosis not present

## 2020-02-23 DIAGNOSIS — I361 Nonrheumatic tricuspid (valve) insufficiency: Secondary | ICD-10-CM | POA: Diagnosis not present

## 2020-02-23 DIAGNOSIS — J189 Pneumonia, unspecified organism: Secondary | ICD-10-CM | POA: Diagnosis not present

## 2020-02-23 DIAGNOSIS — R0602 Shortness of breath: Secondary | ICD-10-CM | POA: Diagnosis not present

## 2020-02-23 LAB — URINE DRUG SCREEN, QUALITATIVE (ARMC ONLY)
Amphetamines, Ur Screen: NOT DETECTED
Barbiturates, Ur Screen: NOT DETECTED
Benzodiazepine, Ur Scrn: POSITIVE — AB
Cannabinoid 50 Ng, Ur ~~LOC~~: NOT DETECTED
Cocaine Metabolite,Ur ~~LOC~~: NOT DETECTED
MDMA (Ecstasy)Ur Screen: NOT DETECTED
Methadone Scn, Ur: NOT DETECTED
Opiate, Ur Screen: NOT DETECTED
Phencyclidine (PCP) Ur S: NOT DETECTED
Tricyclic, Ur Screen: POSITIVE — AB

## 2020-02-23 LAB — URINALYSIS, COMPLETE (UACMP) WITH MICROSCOPIC
Bacteria, UA: NONE SEEN
Bilirubin Urine: NEGATIVE
Glucose, UA: NEGATIVE mg/dL
Hgb urine dipstick: NEGATIVE
Ketones, ur: NEGATIVE mg/dL
Leukocytes,Ua: NEGATIVE
Nitrite: NEGATIVE
Protein, ur: 30 mg/dL — AB
Specific Gravity, Urine: >1.046 — ABNORMAL HIGH (ref 1.005–1.030)
pH: 5 (ref 5.0–8.0)

## 2020-02-23 LAB — CBG MONITORING, ED: Glucose-Capillary: 119 mg/dL — ABNORMAL HIGH (ref 70–99)

## 2020-02-23 LAB — BRAIN NATRIURETIC PEPTIDE: B Natriuretic Peptide: 19.3 pg/mL (ref 0.0–100.0)

## 2020-02-23 LAB — BLOOD GAS, ARTERIAL
Acid-Base Excess: 3.9 mmol/L — ABNORMAL HIGH (ref 0.0–2.0)
Bicarbonate: 29.2 mmol/L — ABNORMAL HIGH (ref 20.0–28.0)
FIO2: 0.28
O2 Saturation: 98.8 %
Patient temperature: 37
pCO2 arterial: 46 mmHg (ref 32.0–48.0)
pH, Arterial: 7.41 (ref 7.350–7.450)
pO2, Arterial: 122 mmHg — ABNORMAL HIGH (ref 83.0–108.0)

## 2020-02-23 LAB — CBC
HCT: 37.4 % (ref 36.0–46.0)
Hemoglobin: 12.5 g/dL (ref 12.0–15.0)
MCH: 30.7 pg (ref 26.0–34.0)
MCHC: 33.4 g/dL (ref 30.0–36.0)
MCV: 91.9 fL (ref 80.0–100.0)
Platelets: 480 10*3/uL — ABNORMAL HIGH (ref 150–400)
RBC: 4.07 MIL/uL (ref 3.87–5.11)
RDW: 14.1 % (ref 11.5–15.5)
WBC: 13.8 10*3/uL — ABNORMAL HIGH (ref 4.0–10.5)
nRBC: 0 % (ref 0.0–0.2)

## 2020-02-23 LAB — BASIC METABOLIC PANEL
Anion gap: 12 (ref 5–15)
BUN: 24 mg/dL — ABNORMAL HIGH (ref 6–20)
CO2: 25 mmol/L (ref 22–32)
Calcium: 9.3 mg/dL (ref 8.9–10.3)
Chloride: 101 mmol/L (ref 98–111)
Creatinine, Ser: 1 mg/dL (ref 0.44–1.00)
GFR, Estimated: >60 mL/min (ref >60)
Glucose, Bld: 98 mg/dL (ref 70–99)
Potassium: 4 mmol/L (ref 3.5–5.1)
Sodium: 138 mmol/L (ref 135–145)

## 2020-02-23 LAB — LACTIC ACID, PLASMA
Lactic Acid, Venous: 2.4 mmol/L (ref 0.5–1.9)
Lactic Acid, Venous: 1.1 mmol/L (ref 0.5–1.9)

## 2020-02-23 LAB — D-DIMER, QUANTITATIVE: D-Dimer, Quant: 0.3 ug/mL-FEU (ref 0.00–0.50)

## 2020-02-23 LAB — SODIUM, URINE, RANDOM: Sodium, Ur: 67 mmol/L

## 2020-02-23 LAB — TROPONIN I (HIGH SENSITIVITY): Troponin I (High Sensitivity): 4 ng/L (ref <18)

## 2020-02-23 LAB — CREATININE, URINE, RANDOM: Creatinine, Urine: 316 mg/dL

## 2020-02-23 MED ORDER — ONDANSETRON HCL 4 MG/2ML IJ SOLN: 4.0000 mg | Freq: Once | INTRAMUSCULAR | Status: DC

## 2020-02-23 MED ORDER — ALBUTEROL SULFATE HFA 108 (90 BASE) MCG/ACT IN AERS
2.0000 | INHALATION_SPRAY | Freq: Four times a day (QID) | RESPIRATORY_TRACT | Status: DC | PRN
  Filled 2020-02-23: qty 6.7

## 2020-02-23 MED ORDER — HYDROCODONE-ACETAMINOPHEN 5-325 MG PO TABS
1.0000 | ORAL_TABLET | Freq: Four times a day (QID) | ORAL | 0 refills | Status: DC | PRN
Start: 2020-02-23 — End: 2020-03-02

## 2020-02-23 MED ORDER — CEFDINIR 300 MG PO CAPS: 300.0000 mg | ORAL_CAPSULE | Freq: Two times a day (BID) | ORAL | 0 refills | Status: DC

## 2020-02-23 MED ORDER — PAROXETINE HCL 30 MG PO TABS
60.0000 mg | ORAL_TABLET | Freq: Every day | ORAL | Status: DC
  Filled 2020-02-23: qty 2

## 2020-02-23 MED ORDER — LORAZEPAM 2 MG/ML IJ SOLN
1.0000 mg | Freq: Once | INTRAMUSCULAR | Status: AC
  Administered 2020-02-23: 1 mg via INTRAVENOUS
  Filled 2020-02-23: qty 1

## 2020-02-23 MED ORDER — ASPIRIN EC 81 MG PO TBEC: 81.0000 mg | DELAYED_RELEASE_TABLET | Freq: Every day | ORAL | Status: DC

## 2020-02-23 MED ORDER — PREDNISONE 20 MG PO TABS
20.0000 mg | ORAL_TABLET | Freq: Once | ORAL | Status: AC
  Administered 2020-02-23: 20 mg via ORAL
  Filled 2020-02-23: qty 1

## 2020-02-23 MED ORDER — LEVOFLOXACIN 750 MG PO TABS
750.0000 mg | ORAL_TABLET | Freq: Once | ORAL | Status: DC
  Filled 2020-02-23: qty 1

## 2020-02-23 MED ORDER — ACETAMINOPHEN 325 MG PO TABS
650.0000 mg | ORAL_TABLET | Freq: Four times a day (QID) | ORAL | Status: DC | PRN
  Administered 2020-02-24: 650 mg via ORAL
  Filled 2020-02-23: qty 2

## 2020-02-23 MED ORDER — PREDNISONE 20 MG PO TABS: 20.0000 mg | ORAL_TABLET | Freq: Every day | ORAL | Status: DC

## 2020-02-23 MED ORDER — MORPHINE SULFATE (PF) 4 MG/ML IV SOLN: 4.0000 mg | Freq: Once | INTRAVENOUS | Status: DC

## 2020-02-23 MED ORDER — LACTATED RINGERS IV BOLUS
1000.0000 mL | Freq: Once | INTRAVENOUS | Status: AC
  Administered 2020-02-23: 1000 mL via INTRAVENOUS

## 2020-02-23 MED ORDER — IOHEXOL 350 MG/ML SOLN
75.0000 mL | Freq: Once | INTRAVENOUS | Status: AC | PRN
  Administered 2020-02-23: 75 mL via INTRAVENOUS

## 2020-02-23 MED ORDER — KETOROLAC TROMETHAMINE 30 MG/ML IJ SOLN: 15.0000 mg | Freq: Four times a day (QID) | INTRAMUSCULAR | Status: DC | PRN

## 2020-02-23 MED ORDER — LAMOTRIGINE 25 MG PO TABS: 25.0000 mg | ORAL_TABLET | Freq: Every day | ORAL | Status: DC

## 2020-02-23 MED ORDER — PANTOPRAZOLE SODIUM 40 MG PO TBEC: 40.0000 mg | DELAYED_RELEASE_TABLET | ORAL | Status: DC

## 2020-02-23 MED ORDER — LACTATED RINGERS IV SOLN: INTRAVENOUS | Status: DC

## 2020-02-23 MED ORDER — ALPRAZOLAM 0.5 MG PO TABS
1.0000 mg | ORAL_TABLET | Freq: Every day | ORAL | Status: DC
  Administered 2020-02-23: 1 mg via ORAL
  Filled 2020-02-23: qty 2

## 2020-02-23 MED ORDER — ICOSAPENT ETHYL 1 G PO CAPS
1.0000 g | ORAL_CAPSULE | Freq: Three times a day (TID) | ORAL | Status: DC
  Administered 2020-02-23: 1 g via ORAL
  Filled 2020-02-23 (×5): qty 1

## 2020-02-23 MED ORDER — ENOXAPARIN SODIUM 40 MG/0.4ML ~~LOC~~ SOLN: 40.0000 mg | SUBCUTANEOUS | Status: DC

## 2020-02-23 MED ORDER — KETOROLAC TROMETHAMINE 30 MG/ML IJ SOLN
15.0000 mg | Freq: Once | INTRAMUSCULAR | Status: AC
  Administered 2020-02-23: 15 mg via INTRAVENOUS
  Filled 2020-02-23: qty 1

## 2020-02-23 MED ORDER — PREDNISONE 10 MG PO TABS: 10.0000 mg | ORAL_TABLET | Freq: Every day | ORAL | Status: DC

## 2020-02-23 MED ORDER — ACETAMINOPHEN 650 MG RE SUPP: 650.0000 mg | Freq: Four times a day (QID) | RECTAL | Status: DC | PRN

## 2020-02-23 MED ORDER — DOXYCYCLINE HYCLATE 100 MG PO CAPS: 100.0000 mg | ORAL_CAPSULE | Freq: Two times a day (BID) | ORAL | 0 refills | Status: DC

## 2020-02-23 MED ORDER — ROSUVASTATIN CALCIUM 20 MG PO TABS
40.0000 mg | ORAL_TABLET | Freq: Every day | ORAL | Status: DC
  Administered 2020-02-23: 40 mg via ORAL
  Filled 2020-02-23 (×2): qty 2

## 2020-02-23 MED ORDER — SODIUM CHLORIDE 0.9 % IV SOLN
2.0000 g | Freq: Once | INTRAVENOUS | Status: AC
  Administered 2020-02-23: 2 g via INTRAVENOUS

## 2020-02-23 MED ORDER — LIDOCAINE VISCOUS HCL 2 % MT SOLN
15.0000 mL | Freq: Once | OROMUCOSAL | Status: AC
  Administered 2020-02-23: 15 mL via ORAL
  Filled 2020-02-23: qty 15

## 2020-02-23 MED ORDER — SODIUM CHLORIDE 0.9 % IV BOLUS
1000.0000 mL | Freq: Once | INTRAVENOUS | Status: AC
  Administered 2020-02-23: 1000 mL via INTRAVENOUS

## 2020-02-23 MED ORDER — PAROXETINE HCL 30 MG PO TABS
60.0000 mg | ORAL_TABLET | Freq: Every day | ORAL | Status: DC
  Administered 2020-02-23: 60 mg via ORAL
  Filled 2020-02-23 (×2): qty 2

## 2020-02-23 MED ORDER — ALUM & MAG HYDROXIDE-SIMETH 200-200-20 MG/5ML PO SUSP
30.0000 mL | Freq: Once | ORAL | Status: AC
  Administered 2020-02-23: 30 mL via ORAL
  Filled 2020-02-23: qty 30

## 2020-02-23 NOTE — ED Notes (Signed)
Abnormal VS noted when attempting to discharge. MD at bedside.

## 2020-02-23 NOTE — Discharge Instructions (Signed)
As we discussed, your symptoms are likely related to ongoing inflammation from your COVID-19  Take Ibuprofen 600 mg every 8 hours for moderate pain  Take the antibiotics for possible concomitant bacterial infection  Continue your supplemental oxygen at home  Drink AT LEAST 6-8 glasses of water daily

## 2020-02-23 NOTE — ED Notes (Signed)
Respiratory at bedside to draw ABG 

## 2020-02-23 NOTE — Telephone Encounter (Signed)
Patient's mother called to inform the doctor that the patient missed the appt. This morning because she is in the hospital

## 2020-02-23 NOTE — ED Provider Notes (Signed)
Called to see patient who is unresponsive.  She wakes up after shining light in her eyes.  Her pupils are equal round reactive light.  She says nothing is hurting her she does not have a headache chest pain or bellyache.  We will check her fingerstick to make sure she is not hypoglycemic and we will check her ABG to make sure she is not retaining CO2.  Blood pressure and O2 sat were okay.   Arnaldo Natal, MD 02/23/20 [phone]

## 2020-02-23 NOTE — ED Provider Notes (Addendum)
[facility] Emergency Department Provider Note  ____________________________________________   Event Date/Time   First MD Initiated Contact with Patient 02/23/20 [phone]     (approximate)  I have reviewed the triage vital signs and the nursing notes.   HISTORY  Chief Complaint Chest Pain and Shortness of Breath (/)    HPI Alicia [name] is a 50 y.o. female  With h/o HTN, HLD, PNES, cryptogenic stroke, here with chest pain, SOB. Pt was just hospitalized for COVID-19 related hypoxia, started on supplemental O2, and sent home on 1/1. Since then, she has had intermittent episodes of SOB/tachycardia but no overt pain. She states that she awoke this AM with severe, aching, substernal chest pressure along with tachycardia and SOB. She checked her pulse ox and it was in the 70s, with HR up to 150s. The pain was pressure like severe, and also pleuritic. She had associated mild SOB and nausea. Reports she felt anxious with this as well. Pain is now improving spontaneously. Worse w/ inspiration and movement. MIld improvement noted with sitting upright. No other complaints.        Past Medical History:  Diagnosis Date  . Anemia   . Anxiety   . Depression   . Dyspnea    SINCE STROKE  . GERD (gastroesophageal reflux disease)   . Hyperlipidemia   . Hypertension   . IBS (irritable bowel syndrome)   . Migraine   . Previous cesarean delivery, delivered, with or without mention of antepartum condition 01/19/2012  . Psychogenic nonepileptic seizure    hx/notes 12/19/2016-LAST SEIZURE IN 2019-ON NO MEDS AS OF 05-24-19  . Restless leg syndrome   . Sleep apnea    USES CPAP  . Stroke (HCC) 12/18/2016   Acute arterial ischemic stroke, multifocal, posterior circulation /notes 12/19/2016-LEFT SIDED WEAKNESS, MEMORY FATIGUE AND TROUBLE FINDING HER WORDS [facility]    Patient Active Problem List   Diagnosis Date Noted  . Acute hypoxemic respiratory failure (HCC) 02/09/2020  .  Pneumonia due to COVID-19 virus 02/07/2020  . Reactive airways dysfunction syndrome (HCC) 10/15/2019  . Hyperfunction of pituitary gland, unspecified (HCC) 10/15/2019  . Abdominal pain 05/31/2019  . Calculus of gallbladder without cholecystitis without obstruction 05/06/2019  . Cyst of pancreas 05/06/2019  . Right kidney stone 05/06/2019  . Leukocytoclastic vasculitis (HCC) 10/07/2018  . Chronic insomnia 02/09/2018  . History of anemia 02/09/2018  . Sleep apnea 09/24/2017  . History of stroke 05/30/2017  . Hyperlipidemia 05/30/2017  . Migraine without aura and without status migrainosus, not intractable 05/27/2017  . Pain in right knee 01/21/2017  . Psychogenic nonepileptic seizure   . Chronic pain syndrome   . Restless leg syndrome   . Chronic prescription benzodiazepine use 01/20/2016  . Leukocytosis 01/20/2016  . Seizure-like activity (HCC)   . GAD (generalized anxiety disorder) 02/14/2015  . Chronic neck pain 02/14/2015  . History of hysterectomy 02/14/2015  . Grieving 12/16/2014  . Iron deficiency anemia due to chronic blood loss 08/29/2014  . Insomnia, persistent 08/14/2014  . Major depression (HCC) 08/14/2014  . Temporomandibular joint sounds on opening and/or closing the jaw 08/14/2014  . Degenerative disc disease, lumbar 08/14/2014  . Bleeding internal hemorrhoids 08/14/2014  . Gastric reflux 08/14/2014  . Blood glucose elevated 08/14/2014  . Irritable bowel syndrome with constipation 08/14/2014  . Hypertriglyceridemia 08/14/2014  . Overweight 08/14/2014  . Tinnitus 08/14/2014  . Vitamin D deficiency 08/14/2014  . Tachycardia 11/25/2012  . DOE (dyspnea on exertion) 11/06/2012  Past Surgical History:  Procedure Laterality Date  . ABDOMINOPLASTY  Feb. 2015  . CESAREAN SECTION  2011  . CHOLECYSTECTOMY N/A 05/31/2019   Procedure: LAPAROSCOPIC CHOLECYSTECTOMY WITH INTRAOPERATIVE CHOLANGIOGRAM;  Surgeon: Earline MayotteByrnett, Jeffrey W, MD;  Location: [facility] ORS;  Service:  General;  Laterality: N/A;  . COLONOSCOPY WITH PROPOFOL N/A 11/26/2016   Procedure: COLONOSCOPY WITH PROPOFOL;  Surgeon: Wyline MoodAnna, Kiran, MD;  [facility];  Service: Gastroenterology;  Laterality: N/A;  . CYSTOSCOPY  02/02/2015   Procedure: CYSTOSCOPY;  Surgeon: Nadara Mustardobert P Harris, MD;  Location: [facility] ORS;  Service: Gynecology;;  . Joya GaskinsILATION AND CURETTAGE OF UTERUS  2003, 2005, 2008  . ESOPHAGOGASTRODUODENOSCOPY N/A 05/31/2019   Procedure: ESOPHAGOGASTRODUODENOSCOPY (EGD);  Surgeon: Earline MayotteByrnett, Jeffrey W, MD;  Location: [facility] ORS;  Service: General;  Laterality: N/A;  in the O.R.  . ESOPHAGOGASTRODUODENOSCOPY (EGD) WITH PROPOFOL N/A 11/26/2016   Procedure: ESOPHAGOGASTRODUODENOSCOPY (EGD) WITH PROPOFOL;  Surgeon: Wyline MoodAnna, Kiran, MD;  [facility];  Service: Gastroenterology;  Laterality: N/A;  . HERNIA REPAIR  1999   Umbilical  . KNEE ARTHROSCOPY Right 2006  . LAPAROSCOPIC BILATERAL SALPINGECTOMY Bilateral 02/02/2015   Procedure: LAPAROSCOPIC BILATERAL SALPINGECTOMY;  Surgeon: Nadara Mustardobert P Harris, MD;  Location: [facility] ORS;  Service: Gynecology;  Laterality: Bilateral;  . LAPAROSCOPIC HYSTERECTOMY N/A 02/02/2015   Procedure: HYSTERECTOMY TOTAL LAPAROSCOPIC;  Surgeon: Nadara Mustardobert P Harris, MD;  Location: [facility] ORS;  Service: Gynecology;  Laterality: N/A;  . LOOP RECORDER INSERTION N/A 12/20/2016   Procedure: LOOP RECORDER INSERTION;  Surgeon: Regan Lemmingamnitz, Will Martin, MD;  Location: [facility] IN[facility]IVE [facility] LAB;  Service: Cardiovascular;  Laterality: N/A;  . REDUCTION MAMMAPLASTY Bilateral December 2015  . TEE WITHOUT CARDIOVERSION N/A 12/20/2016   Procedure: TRANSESOPHAGEAL ECHOCARDIOGRAM (TEE);  Surgeon: Lars MassonNelson, Katarina H, MD;  [facility];  Service: Cardiovascular;  Laterality: N/A;    Prior to Admission medications   Medication Sig Start Date End Date Taking? Authorizing Provider  cefdinir (OMNICEF) 300 MG capsule Take 1 capsule (300 mg total) by mouth 2 (two) times daily for 7 days. 02/23/20  03/01/20 Yes Shaune PollackIsaacs, Enrrique Mierzwa, MD  doxycycline (VIBRAMYCIN) 100 MG capsule Take 1 capsule (100 mg total) by mouth 2 (two) times daily for 7 days. 02/23/20 03/01/20 Yes Shaune PollackIsaacs, Rosary Filosa, MD  HYDROcodone-acetaminophen (NORCO/VICODIN) 5-325 MG tablet Take 1 tablet by mouth every 6 (six) hours as needed for severe pain. 02/23/20 02/22/21 Yes Shaune PollackIsaacs, Annell Canty, MD  albuterol (VENTOLIN HFA) 108 (90 Base) MCG/ACT inhaler Inhale 2 puffs into the lungs every 6 (six) hours as needed for wheezing or shortness of breath. 02/18/20   [place], Eric J, DO  ALPRAZolam (XANAX XR) 1 MG 24 hr tablet Take 1 tablet (1 mg total) by mouth every morning. 01/04/20   [name], [name], MD  ascorbic acid (VITAMIN C) 500 MG tablet Take 1 tablet (500 mg total) by mouth daily. 02/19/20 03/20/20  [place], Alvira PhilipsEric J, DO  aspirin EC 81 MG tablet Take 81 mg by mouth daily.    [provider]  cyclobenzaprine (FLEXERIL) 10 MG tablet Take 1 tablet (10 mg total) by mouth every evening. 01/04/20   [name], [name], MD  Dapsone (ACZONE) 7.5 % GEL Apply a thin coat to the face BID 12/27/19   Deirdre EvenerKowalski, David C, MD  guaiFENesin-dextromethorphan [facility](ROBITUSSIN DM) 100-10 MG/5ML syrup Take 10 mLs by mouth every 4 (four) hours as needed for cough. 02/18/20   [place], Alvira PhilipsEric J, DO  icosapent Ethyl (VASCEPA) 1 g capsule TAKE 1 CAPSULE BY MOUTH EVERY MORNING, AT NOON, AND AT BEDTIME 01/31/20  Alba Cory, MD  lamoTRIgine (LAMICTAL) 25 MG tablet Take by mouth. 08/03/19   [provider]  pantoprazole (PROTONIX) 40 MG tablet Take 1 tablet (40 mg total) by mouth every morning. 01/04/20   Alba Cory, MD  PARoxetine (PAXIL) 20 MG tablet Take 3 tablets (60 mg total) by mouth daily. 01/04/20   Alba Cory, MD  predniSONE (DELTASONE) 10 MG tablet Take 4 tablets (40 mg total) by mouth daily for 2 days, THEN 3 tablets (30 mg total) daily for 2 days, THEN 2 tablets (20 mg total) daily for 2 days, THEN 1 tablet (10 mg total) daily for 2 days. 02/19/20 02/27/20   [place], Alvira Philips, DO  rosuvastatin (CRESTOR) 40 MG tablet Take 1 tablet (40 mg total) by mouth daily. 01/04/20   Alba Cory, MD  UBRELVY 100 MG TABS Take 100 mg by mouth daily as needed (migraine).  10/27/18   Lonell Face, MD  Vitamin D, Ergocalciferol, (DRISDOL) 1.25 MG (50000 UNIT) CAPS capsule Take 1 capsule (50,000 Units total) by mouth every 7 (seven) days. 01/04/20   Alba Cory, MD  zinc sulfate 220 (50 Zn) MG capsule Take 1 capsule (220 mg total) by mouth daily. 02/19/20 03/20/20  [place], Alvira Philips, DO  zolpidem (AMBIEN CR) 12.5 MG CR tablet Take 1 tablet (12.5 mg total) by mouth at bedtime as needed for sleep. 01/04/20   Alba Cory, MD    Allergies Azithromycin and Penicillins  Family History  Problem Relation Age of Onset  . Hypertension Mother   . Hyperlipidemia Mother   . Heart Problems Father        hole in heart and lower ventricles reversed  . Prostate cancer Maternal Grandfather   . Von Willebrand disease Maternal Uncle     Social History Social History   Tobacco Use  . Smoking status: Never Smoker  . Smokeless tobacco: Never Used  Vaping Use  . Vaping Use: Never used  Substance Use Topics  . Alcohol use: No    Alcohol/week: 0.0 standard drinks  . Drug use: No    Review of Systems  Review of Systems  Constitutional: Positive for fatigue. Negative for fever.  HENT: Negative for congestion and sore throat.   Eyes: Negative for visual disturbance.  Respiratory: Positive for chest tightness and shortness of breath. Negative for cough.   Cardiovascular: Negative for chest pain.  Gastrointestinal: Positive for nausea. Negative for abdominal pain, diarrhea and vomiting.  Genitourinary: Negative for flank pain.  Musculoskeletal: Negative for back pain and neck pain.  Skin: Negative for rash and wound.  Neurological: Positive for weakness.  Psychiatric/Behavioral: The patient is nervous/anxious.   All other systems reviewed and are negative.     ____________________________________________  PHYSICAL EXAM:      VITAL SIGNS: ED Triage Vitals  Enc Vitals Group     BP 02/23/20 0640 (!) 91/53     Pulse Rate 02/23/20 0640 (!) 144     Resp 02/23/20 0640 20     Temp 02/23/20 0640 98.9 F (37.2 C)     Temp Source 02/23/20 0640 Oral     SpO2 02/23/20 0640 99 %     Weight 02/23/20 0633 200 lb (90.7 kg)     Height 02/23/20 0633 5\' 8"  (1.727 m)     Head Circumference --      Peak Flow --      Pain Score 02/23/20 0633 1     Pain Loc --      Pain Edu? --  Excl. in GC? --      Physical Exam Vitals and nursing note reviewed.  Constitutional:      General: She is not in acute distress.    Appearance: She is well-developed.  HENT:     Head: Normocephalic and atraumatic.  Eyes:     Conjunctiva/sclera: Conjunctivae normal.  Cardiovascular:     Rate and Rhythm: Regular rhythm. Tachycardia present.     Heart sounds: Normal heart sounds. No murmur heard. No friction rub.  Pulmonary:     Effort: Pulmonary effort is normal. Tachypnea present. No respiratory distress.     Breath sounds: Normal breath sounds. No wheezing or rales.  Abdominal:     General: There is no distension.     Palpations: Abdomen is soft.     Tenderness: There is no abdominal tenderness.  Musculoskeletal:     Cervical back: Neck supple.  Skin:    General: Skin is warm.     Capillary Refill: Capillary refill takes less than 2 seconds.  Neurological:     Mental Status: She is alert and oriented to person, place, and time.     Motor: No abnormal muscle tone.       ____________________________________________   LABS (all labs ordered are listed, but only abnormal results are displayed)  Labs Reviewed  BASIC METABOLIC PANEL - Abnormal; Notable for the following components:      Result Value   BUN 24 (*)    All other components within normal limits  CBC - Abnormal; Notable for the following components:   WBC 13.8 (*)    Platelets 480 (*)     All other components within normal limits  LACTIC ACID, PLASMA - Abnormal; Notable for the following components:   Lactic Acid, Venous 2.4 (*)    All other components within normal limits  BRAIN NATRIURETIC PEPTIDE  LACTIC ACID, PLASMA  D-DIMER, QUANTITATIVE (NOT AT [facility])  POC URINE PREG, ED  TROPONIN I (HIGH SENSITIVITY)  TROPONIN I (HIGH SENSITIVITY)    ____________________________________________  EKG: Sinus tachycardia, ventricular rate 136.  QRS 74, QTc 571.  No acute ST elevations or depression.  No EKG evidence of acute ischemia or infarct. ________________________________________  RADIOLOGY All imaging, including plain films, CT scans, and ultrasounds, independently reviewed by me, and interpretations confirmed via formal radiology reads.  ED MD interpretation:   Chest x-ray: Bilateral interstitial infiltrates CT angio: Persistent bilateral airspace disease, though slightly improved from prior.  No PE.  Official radiology report(s): CT Angio Chest PE W and/or Wo Contrast  Result Date: 02/23/2020 CLINICAL DATA:  Onset chest pain and shortness of breath this morning. History of COVID-19 pneumonia. EXAM: CT ANGIOGRAPHY CHEST WITH CONTRAST TECHNIQUE: Multidetector CT imaging of the chest was performed using the standard protocol during bolus administration of intravenous contrast. Multiplanar CT image reconstructions and MIPs were obtained to evaluate the vascular anatomy. CONTRAST:  75 mL OMNIPAQUE IOHEXOL 350 MG/ML SOLN COMPARISON:  Single-view of the chest today and 02/07/2020. CT chest 02/07/2020. FINDINGS: Cardiovascular: Satisfactory opacification of the pulmonary arteries to the segmental level. No evidence of pulmonary embolism. Normal heart size. No pericardial effusion. Mediastinum/Nodes: No enlarged mediastinal, hilar, or axillary lymph nodes. Thyroid gland, trachea, and esophagus demonstrate no significant findings. Lungs/Pleura: No pleural effusion. There is ground-glass  attenuating airspace disease in all lobes of both lungs. The density of airspace disease has improved since the prior CT although it is somewhat more extensive. Upper Abdomen: Status post cholecystectomy. Musculoskeletal: No acute or focal abnormality.  Review of the MIP images confirms the above findings. IMPRESSION: Negative for pulmonary embolus. Extensive bilateral airspace disease consistent with COVID-19 infection persists but does show some improvement since prior chest CT. Electronically Signed   By: Inge Rise M.D.   On: 02/23/2020 10:12   DG Chest Portable 1 View  Result Date: 02/23/2020 CLINICAL DATA:  Chest pain shortness of breath.  COVID positive. EXAM: PORTABLE CHEST 1 VIEW COMPARISON:  CT 02/07/2020.  Chest x-ray 02/07/2020. FINDINGS: Cardiac monitoring device noted over the chest. Mediastinum and hilar structures are unremarkable. Heart size normal. Bilateral interstitial infiltrates again noted. Slight improvement from prior exams. No pleural effusion or pneumothorax. IMPRESSION: Bilateral interstitial infiltrates again noted. Slight improvement from prior exams. Findings consistent with persistent pneumonitis. Electronically Signed   By: Marcello Moores  Register   On: 02/23/2020 06:55    ____________________________________________  PROCEDURES   Procedure(s) performed (including Critical Care):  Procedures  ____________________________________________  INITIAL IMPRESSION / MDM / [place] / ED COURSE  As part of my medical decision making, I reviewed the following data within the [place] notes reviewed and incorporated, Old chart reviewed, Notes from prior ED visits, and [place] Controlled Substance Database       *Amaya Blakeman Note [name] was evaluated in Emergency Department on 02/23/2020 for the symptoms described in the history of present illness. She was evaluated in the context of the global COVID-19 pandemic, which necessitated consideration  that the patient might be at risk for infection with the SARS-CoV-2 virus that causes COVID-19. Institutional protocols and algorithms that pertain to the evaluation of patients at risk for COVID-19 are in a state of rapid change based on information released by regulatory bodies including the CDC and federal and state organizations. These policies and algorithms were followed during the patient's care in the ED.  Some ED evaluations and interventions may be delayed as a result of limited staffing during the pandemic.*     Medical Decision Making: 50 year old female here with chest pain.  Patient was just hospitalized for over a week secondary to COVID-19 and remains on supplemental oxygen.  On arrival here, she was noted to be in moderate distress, tachycardic, and anxious.  She seems to be setting greater than 98% on her baseline 3 L.  Clinically, she appears dehydrated, anxious, but speaking in full sentences.  Lab work obtained and reviewed as above.  Patient has moderate likely reactive leukocytosis, as well as mild dehydration with elevated BUN to creatinine ratio.  Lactic acid 2.4.  She is afebrile without signs of sepsis, however, I suspect this is related to her work of breathing and dehydration.  Given her chest pain, EKG obtained and shows no evidence of ischemia.  She has negative troponin and BNP.  Given recent Covid status, as well as abnormal chest x-ray, CT angio obtained and shows no evidence of PE.  She has persistent airspace disease which I believe is contributing to some pleurisy causing her pain, but no evidence of PE or superimposed significant bacterial pneumonia.  That being said, given her initial leukocytosis and tachycardia, feels reasonable to treat her empirically with antibiotics.  Following fluids and reassurance, heart rate improved to the low 100s and blood pressure is at baseline.  The patient has had a well-documented history of chronic tachycardia and was previously on a  beta-blocker for this, which she stopped with COVID-19.  Her heart rate throughout her hospitalization was persistently above 100.  She states she feels back  to her baseline.  Given that she is already on supplemental oxygen with vitals that have been similar to her previous hospitalization and vitals at discharge, she would like to attempt outpatient management at this time which I think is reasonable.  Will treat her empirically for possible superimposed early bacterial pneumonia, encourage fluids, and very good return precautions.  ADDENDUM: While attempting to ambulate, pt had return of lightheadedness/dizziness with BP 70s/50s. Returned to MAP>65 with lying down. Given recurrence of hypotension and symptoms with ongoing COVID-19, will admit for obs/rehdyration. ____________________________________________  FINAL CLINICAL IMPRESSION(S) / ED DIAGNOSES  Final diagnoses:  COVID-19  Pleurisy     MEDICATIONS GIVEN DURING THIS VISIT:  Medications  cefTRIAXone (ROCEPHIN) 2 g in sodium chloride 0.9 % 100 mL IVPB (2 g Intravenous New Bag/Given 02/23/20 1040)  sodium chloride 0.9 % bolus 1,000 mL (0 mLs Intravenous Stopped 02/23/20 1020)  alum & mag hydroxide-simeth (MAALOX/MYLANTA) 200-200-20 MG/5ML suspension 30 mL (30 mLs Oral Given 02/23/20 0739)    And  lidocaine (XYLOCAINE) 2 % viscous mouth solution 15 mL (15 mLs Oral Given 02/23/20 0739)  LORazepam (ATIVAN) injection 1 mg (1 mg Intravenous Given 02/23/20 0741)  lactated ringers bolus 1,000 mL (1,000 mLs Intravenous New Bag/Given 02/23/20 1020)  iohexol (OMNIPAQUE) 350 MG/ML injection 75 mL (75 mLs Intravenous Contrast Given 02/23/20 0949)  ketorolac (TORADOL) 30 MG/ML injection 15 mg (15 mg Intravenous Given 02/23/20 1041)     ED Discharge Orders         Ordered    doxycycline (VIBRAMYCIN) 100 MG capsule  2 times daily        02/23/20 1040    cefdinir (OMNICEF) 300 MG capsule  2 times daily        02/23/20 1040    HYDROcodone-acetaminophen  (NORCO/VICODIN) 5-325 MG tablet  Every 6 hours PRN        02/23/20 1040           Note:  This document was prepared using Dragon voice recognition software and may include unintentional dictation errors.   Shaune Pollack, MD 02/23/20 1046    Shaune Pollack, MD 02/23/20 [phone]

## 2020-02-23 NOTE — ED Notes (Signed)
Entered room to find patient unresponsive. Faint pulses. Pt eventually arouses to noxious stimuli. ED MD at bedside. Admit team paged

## 2020-02-23 NOTE — Telephone Encounter (Signed)
appt cancelled due to pt being in hospital

## 2020-02-23 NOTE — ED Notes (Addendum)
Pt demanding to leave. States "You said I could leave in the morning, its the morning." Not redirectable. Removing monitoring equipment. States "Sometimes my brain gets tired. That's all. I'm going home." Admit MD notified.

## 2020-02-23 NOTE — H&P (Signed)
History and Physical    PLEASE NOTE THAT DRAGON DICTATION SOFTWARE WAS USED IN THE CONSTRUCTION OF THIS NOTE.   Alicia [name] GYF:[id number] DOB: 02-04-1971 DOA: 02/23/2020  PCP: Steele Sizer, MD Patient coming from: home   I have personally briefly reviewed patient's old medical records in [place]  Chief Complaint: dizziness  HPI: Alicia [name] is a 50 y.o. female with medical history significant for obstructive sleep apnea on CPAP, hyperlipidemia, GERD, chronic sinus tachycardia, recent hospitalization for acute hypoxic respiratory failure in setting of severe COVID-19 pneumonia positive COVID-19 PCR on 02/04/2020, who is admitted to [facility] on 02/23/2020 with hypotension after presenting from home to [facility] Emergency Department complaining of dizziness.   Context of recent hospitalization at [facility] from 02/07/2020 to 02/19/2020 for acute hypoxic respiratory failure in setting of severe COVID-19 pneumonia, the patient presents to [facility] ED today complaining of 1 to 2 days of dizziness.  She reports her dizziness has been intermittent over that timeframe, and frequently occurs when attempting to stand from a seated position, associated with mild lightheadedness but denies any associated syncope.  Denies any associated vertigo or preceding trauma.   Following her discharge from the hospital on 02/19/2020 upon which she was discharged on 3 L nasal cannula, with supplemental oxygen requirement new during this previous hospitalization for COVID, she has noted intermittent shortness of breath, that she feels may be slightly worse over the last 1 to 2 days.  Denies any associated orthopnea, PND, or worsening peripheral edema.  She also continues to have a mild nonproductive cough, which she feels is approximately as frequent as intense as with which she left the hospital on 02/19/2020.  Not associate with any hemoptysis.  Denies any recent calf tenderness or lower  extremity erythema.  Denies any associated subjective fever, chills, rigors, or generalized myalgias.  Denies any recent dysuria, gross hematuria, or change in urinary urgency/frequency.  She also reports new onset sharp, achy left-sided chest discomfort over the last 1 to 2 days, which she states is worse with cough or deep inspiration.  She also reports that the left-sided chest discomfort appears to worsen when laying supine, and improves when sitting up or leaning forward.  The chest pain is nonexertional, and resolve spontaneously without need for any pharmacologic intervention.  She notes mild intermittent nausea over the last few days in the absence of any associated vomiting.  She denies any associated palpitations or diaphoresis.  Per chart review, patient has a documented history of recurrent orthostatic hypotension.  She is not currently on any antihypertensive medications at home.  She also has a history of chronic sinus tachycardia, for which she was previously on a beta-blocker, which was discontinued at the time of her hospitalization for COVID-19 pneumonia.  Reports baseline heart rates in the low 100s, stating that her heart rate rarely descendents below 100 bpm.   She conveys poor p.o. intake following discharge from hospital on 122 in the context of poor appetite as well as apprehension intermittent nausea in the absence of vomiting.  She denies any recent abdominal pain, diarrhea, melena, or hematochezia.     ED Course:  Vital signs in the ED were notable for the following: temp max 99.3; heart rate initially noted to be 144, which is improved to 10 3-1 08 following interval administration of IV fluids, as further described below: Initial blood pressure 91/53, which is increased slightly to 104/46; respiratory rate 16-22, and oxygen saturation 99  to 100% on the 3 L nasal cannula upon which she was discharged from the hospital on 02/19/2020.  Labs were notable for the following: CMP  was notable for the following: Sodium 138, potassium 4.0, bicarbonate 25, BUN 24, creatinine 1.0 relative to baseline creatinine of 0.7, with most recent prior creatinine data point of 0.77 on 02/17/2020.  Initial lactic acid found to be 2.4, which subsequently declined to 1.1 following interval IV fluids.  CBC notable for the following: White blood cell count of 13,800, compared to 11,900 on 02/17/2020, hemoglobin 12.5 relative to 10.6 on 02/17/2020, platelets 40.  BNP 19, high-sensitivity troponin I was found to be 4.  D-dimer 0.30.  Chest x-ray, by way of comparison imaging performed on 02/15/2020, bilateral interstitial infiltrates, that appears slightly improved and consistent with persistent pneumonitis.  CTA chest showed no evidence of acute pulmonary embolism, and showed extensive bilateral airspace disease consistent with COVID-19 infection, but showing some improvement relative to most recent prior CT of the chest on 02/07/2020.  EKG showed sinus tachycardia with heart rate 136, QTc 571, and no evidence of T wave or ST changes, including no evidence of ST elevation.  While in the ED, the following were administered: Lactated Ringer's x2 L bolus, normal saline x1 L bolus, Toradol 15 mg IV x1, Ativan 1 mg IV x1.  Additionally, prior to evaluation with CTA chest, there was some clinical concern on the part of the emergency department physician for secondary bacterial pneumonia given the patient's present symptoms in the context of recent COVID-19 infection, prompting him to empirically initiate Rocephin.   Subsequently, the patient was admitted to the PCU for further evaluation and management of hypotension in the context of presenting atypical chest pain.    Review of Systems: As per HPI otherwise 10 point review of systems negative.   Past Medical History:  Diagnosis Date  . Anemia   . Anxiety   . Depression   . Dyspnea    SINCE STROKE  . GERD (gastroesophageal reflux disease)   .  Hyperlipidemia   . Hypertension   . IBS (irritable bowel syndrome)   . Migraine   . Previous cesarean delivery, delivered, with or without mention of antepartum condition 01/19/2012  . Psychogenic nonepileptic seizure    hx/notes 12/19/2016-LAST SEIZURE IN 2019-ON NO MEDS AS OF 05-24-19  . Restless leg syndrome   . Sleep apnea    USES CPAP  . Stroke ([place]) 12/18/2016   Acute arterial ischemic stroke, multifocal, posterior circulation /notes 12/19/2016-LEFT SIDED WEAKNESS, MEMORY FATIGUE AND TROUBLE FINDING HER WORDS [facility]    Past Surgical History:  Procedure Laterality Date  . ABDOMINOPLASTY  Feb. 2015  . CESAREAN SECTION  2011  . CHOLECYSTECTOMY N/A 05/31/2019   Procedure: LAPAROSCOPIC CHOLECYSTECTOMY WITH INTRAOPERATIVE CHOLANGIOGRAM;  Surgeon: Robert Bellow, MD;  Location: [facility] ORS;  Service: General;  Laterality: N/A;  . COLONOSCOPY WITH PROPOFOL N/A 11/26/2016   Procedure: COLONOSCOPY WITH PROPOFOL;  Surgeon: Jonathon Bellows, MD;  Location[facility] ENDOSCOPY;  Service: Gastroenterology;  Laterality: N/A;  . CYSTOSCOPY  02/02/2015   Procedure: CYSTOSCOPY;  Surgeon: Gae Dry, MD;  Location: [facility] ORS;  Service: Gynecology;;  . Brigitte Pulse AND CURETTAGE OF UTERUS  2003, 2005, 2008  . ESOPHAGOGASTRODUODENOSCOPY N/A 05/31/2019   Procedure: ESOPHAGOGASTRODUODENOSCOPY (EGD);  Surgeon: Robert Bellow, MD;  Location: [facility] ORS;  Service: General;  Laterality: N/A;  in the O.R.  . ESOPHAGOGASTRODUODENOSCOPY (EGD) WITH PROPOFOL N/A 11/26/2016   Procedure: ESOPHAGOGASTRODUODENOSCOPY (EGD) WITH PROPOFOL;  Surgeon:  Jonathon Bellows, MD;  Location: [facility] ENDOSCOPY;  Service: Gastroenterology;  Laterality: N/A;  . HERNIA REPAIR  [id number]   Umbilical  . KNEE ARTHROSCOPY Right 2006  . LAPAROSCOPIC BILATERAL SALPINGECTOMY Bilateral 02/02/2015   Procedure: LAPAROSCOPIC BILATERAL SALPINGECTOMY;  Surgeon: Gae Dry, MD;  Location: [facility] ORS;  Service: Gynecology;  Laterality: Bilateral;  . LAPAROSCOPIC  HYSTERECTOMY N/A 02/02/2015   Procedure: HYSTERECTOMY TOTAL LAPAROSCOPIC;  Surgeon: Gae Dry, MD;  Location: [facility] ORS;  Service: Gynecology;  Laterality: N/A;  . LOOP RECORDER INSERTION N/A 12/20/2016   Procedure: LOOP RECORDER INSERTION;  Surgeon: Constance Haw, MD;  Location: [place] CV LAB;  Service: Cardiovascular;  Laterality: N/A;  . REDUCTION MAMMAPLASTY Bilateral December 2015  . TEE WITHOUT CARDIOVERSION N/A 12/20/2016   Procedure: TRANSESOPHAGEAL ECHOCARDIOGRAM (TEE);  Surgeon: Irisa Spark, MD;  Location: [facility] ENDOSCOPY;  Service: Cardiovascular;  Laterality: N/A;    Social History:  reports that she has never smoked. She has never used smokeless tobacco. She reports that she does not drink alcohol and does not use drugs.   Allergies  Allergen Reactions  . Azithromycin Diarrhea  . Penicillins Hives and Swelling    Has patient had a PCN reaction causing immediate rash, facial/tongue/throat swelling, SOB or lightheadedness with hypotension: Yes Has patient had a PCN reaction causing severe rash involving mucus membranes or skin necrosis: No Has patient had a PCN reaction that required hospitalization No Has patient had a PCN reaction occurring within the last 10 years: No If all of the above answers are "NO", then may proceed with Cephalosporin use.    Family History  Problem Relation Age of Onset  . Hypertension Mother   . Hyperlipidemia Mother   . Heart Problems Father        hole in heart and lower ventricles reversed  . Prostate cancer Maternal Grandfather   . Von Willebrand disease Maternal Uncle      Prior to Admission medications   Medication Sig Start Date End Date Taking? Authorizing Provider  cefdinir (OMNICEF) 300 MG capsule Take 1 capsule (300 mg total) by mouth 2 (two) times daily for 7 days. 02/23/20 03/01/20 Yes Duffy Bruce, MD  doxycycline (VIBRAMYCIN) 100 MG capsule Take 1 capsule (100 mg total) by mouth 2 (two) times daily for 7  days. 02/23/20 03/01/20 Yes Duffy Bruce, MD  HYDROcodone-acetaminophen (NORCO/VICODIN) 5-325 MG tablet Take 1 tablet by mouth every 6 (six) hours as needed for severe pain. 02/23/20 02/22/21 Yes Duffy Bruce, MD  albuterol (VENTOLIN HFA) 108 (90 Base) MCG/ACT inhaler Inhale 2 puffs into the lungs every 6 (six) hours as needed for wheezing or shortness of breath. 02/18/20   [place] Eric J, DO  ALPRAZolam (XANAX XR) 1 MG 24 hr tablet Take 1 tablet (1 mg total) by mouth every morning. 01/04/20   Steele Sizer, MD  ascorbic acid (VITAMIN C) 500 MG tablet Take 1 tablet (500 mg total) by mouth daily. 02/19/20 03/20/20  [place] Donnamarie Poag, DO  aspirin EC 81 MG tablet Take 81 mg by mouth daily.    [provider]  cyclobenzaprine (FLEXERIL) 10 MG tablet Take 1 tablet (10 mg total) by mouth every evening. 01/04/20   Steele Sizer, MD  Dapsone (ACZONE) 7.5 % GEL Apply a thin coat to the face BID 12/27/19   Ralene Bathe, MD  guaiFENesin-dextromethorphan [facility] DM) 100-10 MG/5ML syrup Take 10 mLs by mouth every 4 (four) hours as needed for cough. 02/18/20   [place] Eric J, DO  icosapent Ethyl (VASCEPA) 1 g capsule TAKE 1 CAPSULE BY MOUTH EVERY MORNING, AT NOON, AND AT BEDTIME 01/31/20   Steele Sizer, MD  lamoTRIgine (LAMICTAL) 25 MG tablet Take by mouth. 08/03/19   [provider]  pantoprazole (PROTONIX) 40 MG tablet Take 1 tablet (40 mg total) by mouth every morning. 01/04/20   Steele Sizer, MD  PARoxetine (PAXIL) 20 MG tablet Take 3 tablets (60 mg total) by mouth daily. 01/04/20   Steele Sizer, MD  predniSONE (DELTASONE) 10 MG tablet Take 4 tablets (40 mg total) by mouth daily for 2 days, THEN 3 tablets (30 mg total) daily for 2 days, THEN 2 tablets (20 mg total) daily for 2 days, THEN 1 tablet (10 mg total) daily for 2 days. 02/19/20 02/27/20  [place] Donnamarie Poag, DO  rosuvastatin (CRESTOR) 40 MG tablet Take 1 tablet (40 mg total) by mouth daily. 01/04/20   Steele Sizer, MD   UBRELVY 100 MG TABS Take 100 mg by mouth daily as needed (migraine).  10/27/18   Vladimir Crofts, MD  Vitamin D, Ergocalciferol, (DRISDOL) 1.25 MG (50000 UNIT) CAPS capsule Take 1 capsule (50,000 Units total) by mouth every 7 (seven) days. 01/04/20   Steele Sizer, MD  zinc sulfate 220 (50 Zn) MG capsule Take 1 capsule (220 mg total) by mouth daily. 02/19/20 03/20/20  [place] Donnamarie Poag, DO  zolpidem (AMBIEN CR) 12.5 MG CR tablet Take 1 tablet (12.5 mg total) by mouth at bedtime as needed for sleep. 01/04/20   Steele Sizer, MD     Objective    Physical Exam: Vitals:   02/23/20 1015 02/23/20 1030 02/23/20 1130 02/23/20 1137  BP: (!) 104/46 110/60 (!) 76/36 (!) 84/41  Pulse: (!) 106 (!) 109 (!) 103   Resp: 15 (!) _0 Temp:      TempSrc:      SpO2: 100% 100% 100%   Weight:      Height:        General: appears to be stated age; alert, oriented Skin: warm, dry, no rash Head:  AT/[place] Mouth:  Oral mucosa membranes appear dry, normal dentition Neck: supple; trachea midline Heart: Mildly tachycardic, but regular; did not appreciate any M/R/G Lungs: Slightly diminished bibasilar breath sounds; otherwise, CTAB, did not appreciate any wheezes, rales, or rhonchi Abdomen: + BS; soft, ND, NT Vascular: 2+ pedal pulses b/l; 2+ radial pulses b/l Extremities: no peripheral edema, no muscle wasting Neuro: strength and sensation intact in upper and lower extremities b/l  Labs on Admission: I have personally reviewed following labs and imaging studies  CBC: Recent Labs  Lab 02/17/20 0354 02/23/20 0659  WBC 11.9* 13.8*  HGB 10.6* 12.5  HCT 30.9* 37.4  MCV 90.1 91.9  PLT 324 638*   Basic Metabolic Panel: Recent Labs  Lab 02/17/20 0354 02/23/20 0659  NA 142 138  K 3.8 4.0  CL 103 101  CO2 31 25  GLUCOSE 108* 98  BUN 19 24*  CREATININE 0.77 1.00  CALCIUM 8.2* 9.3   GFR: Estimated Creatinine Clearance: 80.1 mL/min (by C-G formula based on SCr of 1 mg/dL). Liver Function  Tests: Recent Labs  Lab 02/17/20 0354  AST 67*  ALT 108*  ALKPHOS 85  BILITOT 0.7  PROT 5.3*  ALBUMIN 2.7*   No results for input(s): LIPASE, AMYLASE in the last 168 hours. No results for input(s): AMMONIA in the last 168 hours. Coagulation Profile: No results for input(s): INR, PROTIME in the last 168 hours. Cardiac Enzymes:  No results for input(s): CKTOTAL, CKMB, CKMBINDEX, TROPONINI in the last 168 hours. BNP (last 3 results) No results for input(s): PROBNP in the last 8760 hours. HbA1C: No results for input(s): HGBA1C in the last 72 hours. CBG: No results for input(s): GLUCAP in the last 168 hours. Lipid Profile: No results for input(s): CHOL, HDL, LDLCALC, TRIG, CHOLHDL, LDLDIRECT in the last 72 hours. Thyroid Function Tests: No results for input(s): TSH, T4TOTAL, FREET4, T3FREE, THYROIDAB in the last 72 hours. Anemia Panel: No results for input(s): VITAMINB12, FOLATE, FERRITIN, TIBC, IRON, RETICCTPCT in the last 72 hours. Urine analysis:    Component Value Date/Time   COLORURINE YELLOW (A) 02/15/2020 1039   APPEARANCEUR CLOUDY (A) 02/15/2020 1039   LABSPEC 1.019 02/15/2020 1039   PHURINE 7.0 02/15/2020 1039   GLUCOSEU NEGATIVE 02/15/2020 1039   HGBUR NEGATIVE 02/15/2020 1039   BILIRUBINUR NEGATIVE 02/15/2020 1039   KETONESUR NEGATIVE 02/15/2020 1039   PROTEINUR NEGATIVE 02/15/2020 1039   NITRITE NEGATIVE 02/15/2020 1039   LEUKOCYTESUR NEGATIVE 02/15/2020 1039    Radiological Exams on Admission: CT Angio Chest PE W and/or Wo Contrast  Result Date: 02/23/2020 CLINICAL DATA:  Onset chest pain and shortness of breath this morning. History of COVID-19 pneumonia. EXAM: CT ANGIOGRAPHY CHEST WITH CONTRAST TECHNIQUE: Multidetector CT imaging of the chest was performed using the standard protocol during bolus administration of intravenous contrast. Multiplanar CT image reconstructions and MIPs were obtained to evaluate the vascular anatomy. CONTRAST:  75 mL OMNIPAQUE  IOHEXOL 350 MG/ML SOLN COMPARISON:  Single-view of the chest today and 02/07/2020. CT chest 02/07/2020. FINDINGS: Cardiovascular: Satisfactory opacification of the pulmonary arteries to the segmental level. No evidence of pulmonary embolism. Normal heart size. No pericardial effusion. Mediastinum/Nodes: No enlarged mediastinal, hilar, or axillary lymph nodes. Thyroid gland, trachea, and esophagus demonstrate no significant findings. Lungs/Pleura: No pleural effusion. There is ground-glass attenuating airspace disease in all lobes of both lungs. The density of airspace disease has improved since the prior CT although it is somewhat more extensive. Upper Abdomen: Status post cholecystectomy. Musculoskeletal: No acute or focal abnormality. Review of the MIP images confirms the above findings. IMPRESSION: Negative for pulmonary embolus. Extensive bilateral airspace disease consistent with COVID-19 infection persists but does show some improvement since prior chest CT. Electronically Signed   By: Inge Rise M.D.   On: 02/23/2020 10:12   DG Chest Portable 1 View  Result Date: 02/23/2020 CLINICAL DATA:  Chest pain shortness of breath.  COVID positive. EXAM: PORTABLE CHEST 1 VIEW COMPARISON:  CT 02/07/2020.  Chest x-ray 02/07/2020. FINDINGS: Cardiac monitoring device noted over the chest. Mediastinum and hilar structures are unremarkable. Heart size normal. Bilateral interstitial infiltrates again noted. Slight improvement from prior exams. No pleural effusion or pneumothorax. IMPRESSION: Bilateral interstitial infiltrates again noted. Slight improvement from prior exams. Findings consistent with persistent pneumonitis. Electronically Signed   By: Marcello Moores  Register   On: 02/23/2020 06:55     EKG: Independently reviewed, with result as described above.    Assessment/Plan   Alicia [name] is a 50 y.o. female with medical history significant for obstructive sleep apnea on CPAP, hyperlipidemia, GERD,  chronic sinus tachycardia, recent hospitalization for acute hypoxic respiratory failure in setting of severe COVID-19 pneumonia positive COVID-19 PCR on 02/04/2020, who is admitted to [facility] on 02/23/2020 with hypotension after presenting from home to [facility] Emergency Department complaining of dizziness.     Principal Problem:   Hypotension Active Problems:   Sinus tachycardia   Major  depression ([place])   Gastric reflux   Atypical chest pain   AKI (acute kidney injury) ([place])    #) Hypotension: In the absence of overt shock, the patient's initial blood pressures were noted to be in the systolic 71G -  62I mmHg, before subsequently improving into the low 100s mmHg following administration of IV fluids.  While no longer overtly hypotensive, blood pressures remained soft at this time.  Suspect an element of dehydration in the context of patient's report of diminished oral intake given some improvement in blood pressure following menstruation of as well as improvement in degree of sinus tachycardia following these measures.  The patient does not appear septic at this time, and CTA chest performed today shows gradual improvement in the extensive bilateral airspace disease observed during previous hospitalization for COVID-19 pneumonia, without any evidence of new infiltrates to indicate a secondary bacterial pneumonia.  No other source of infectious process identified at this time, although urinalysis is currently pending.  The patient has had some recent pleuritic chest discomfort, nonelevated D-dimer followed by chest showing no evidence of an acute pulmonary embolism have ruled out this possible contributing factor.  Patient's recent intermittent chest pain is very atypical in nature, and ACS is felt to be less likely given the atypical nature of her chest discomfort in the context of presenting EKG showed no evidence of acute ischemic findings, will initial high-sensitivity troponin  I is found to be negative.  Will check echocardiogram to evaluate any interval development systolic dysfunction relative to most recent prior echocardiogram in August 2020, with additional rule out for any contribution from pericardial effusion in the setting of recent viral infection.  No clinical evidence of suggest acute blood loss anemia.  Another consideration terms of potential factors contributing to her presenting hypotension relates to the longevity of her which she has been receiving steroid treatments, raising the possibility of adrenal insufficiency as her presenting symptoms developed with continued taper of her prednisone.  We will check random a.m. cortisol, which in and of itself would not be diagnostic if low, but would be associated with a high negative predictive value if elevated.  Plan: Continuous LR @ 125 cc/hr. monitor strict I's and O's.  Close monitoring of ensuing blood pressures via routine vital signs.  Monitor on telemetry.  Continue to trend troponin values.  Echocardiogram, as above.  Random a.m. cortisol tomorrow morning, as described above.  Repeat CBC in the morning.  Check urinalysis.      #) Atypical chest pain: 1 to 2 days of nonexertional, pleuritic, left-sided chest discomfort, that appears to be positional component in which she experienced exacerbation while supine and improvement when sitting up or leaning forward, raising the possibility of acute pericarditis, particularly in the setting of recent acute viral infection.  Presentation is very atypical for angina, and ACS is felt to be unlikely given these atypical features as well as presenting EKG showing no evidence of acute ischemic changes, will high-sensitivity troponin I checked 24 hours after onset of this intermittent chest pain has been found to be negative.  However, in the context of presenting hypotension, will continue to trend serial troponin monitor on telemetry.  Differential also includes pleurisy in  the setting of the persistent pneumonitis observed on chest x-ray following recent COVID-19 infection.  CTA chest showed no evidence of acute pulmonary embolism.  We will further evaluate for underlying pericarditis, as further described below.  Plan: Check CRP and ESR.  We will try as needed  IV Toradol to assess for response to this NSAID, will remain cautious given mild AKI at time of presentation.  Monitor on telemetry.  Monitor continuous pulse oximetry.  Continue to trend serial troponin.  Check echo, including in the context of evaluation for persistent hypotension as above.  Continue home prednisone for suspected element of persistent pneumonitis.      #) Acute kidney injury: Presenting creatinine noted to be 1.0, which is relative to apparent baseline creatinine range of 0.7, and is associated with presenting prerenal azotemia. appears prerenal in nature in the setting of dehydration stemming from patient's report of recent decline in oral intake due to diminished appetite, with physical exam findings consistent with that of dehydration, will CBC appears to be consistent with hemoconcentrated results.  There may also been an additional prerenal contribution presenting hypotension causing recent decline renal perfusion.  As acknowledged I have ordered as needed Toradol as work-up and management of presenting atypical chest pain.  At this time, I feel that the ED diagnostic possibilities of this medication, with potential substantiation contribution from pleurisy versus acute pericarditis with improvement in discomfort with medication as well as the symptomatic benefits outweigh the risks in the setting of a very mild presenting acute kidney injury.  We will closely monitor ensuing renal function, particularly in the setting of after mentioned use of this NSAID on a prn basis.  Plan: Lactated Ringer's at 125 cc/h.  Monitor strict I's and O's and weights.  Tempt avoid nephrotoxic agents.  Check  urinalysis with attention for the presence of urinary casts.  Add on random urine sodium as well as random urine creatinine.  Repeat BMP in the morning.  Work-up and management hypotension, as above.      #) Hyperlipidemia: On rosuvastatin as well as Vascepa as an outpatient.  Plan: Continue home lipid-lowering regimen.     #) Chronic sinus tachycardia: The patient has a documented history of chronic sinus tachycardia, for which she was previously treated with long-term use of beta-blocker, although this medication was held at the time of her recent hospitalization for COVID-19, and not resumed at the time of discharge.  In the absence of a beta-blocker, the patient reports that her baseline heart rate is consistently in the low 100s, and rarely descends below 90.  Presenting EKG as well as review of heart monitor is consistent with sinus tachycardia, without electro evidence of acute ischemic changes.  Plan: Monitor on telemetry.  We will add on serum magnesium level.  Repeat BMP and CBC department.      #) GERD: On Protonix as an outpatient.  Plan: Continue home PPI.      #) Depression: Outpatient regimen includes Paxil, Lamictal.  Plan: Continue home Paxil medical.     DVT prophylaxis: Lovenox 40 mg subcu daily Code Status: Full code Family Communication: none Disposition Plan: Per Rounding Team Consults called: none  Admission status: inpatient; pcu    Of note, this patient was added by me to the following Admit List/Treatment Team:  [facility]      PLEASE NOTE THAT [employer]AGON DICTATION SOFTWARE WAS USED IN THE CONSTRUCTION OF THIS NOTE.   [facility] Pager [phone] From 12PM- 12AM  Otherwise, please contact night-coverage  [email or web address] Password [facility]  02/23/2020, 12:29 PM

## 2020-02-23 NOTE — ED Triage Notes (Signed)
Pt to ED via EMS from home c/o center chest pain that she woke up to like elephant on her chest and SOB, pt states if she moves too fast at home she gets light headed and oxygen drops.  Was recently dx COVID 2 weeks ago and placed on oxygen at home 2L [place].  Pt took 4 ASA on her own at home.  EMS vitals 114/70 BP, HR 130-150, 100% 2L [place].  Pt presents A&Ox4, shaking but states from stress, speaking in complete and coherent sentences.

## 2020-02-23 NOTE — ED Notes (Signed)
MD at bedside. 

## 2020-02-24 ENCOUNTER — Inpatient Hospital Stay (HOSPITAL_COMMUNITY)
Admit: 2020-02-24 | Discharge: 2020-02-24 | Disposition: A | Discharge status: Home / Self Care | Payer: BC Managed Care – PPO | Attending: Internal Medicine | Admitting: Internal Medicine

## 2020-02-24 DIAGNOSIS — R079 Chest pain, unspecified: Secondary | ICD-10-CM | POA: Diagnosis not present

## 2020-02-24 DIAGNOSIS — I361 Nonrheumatic tricuspid (valve) insufficiency: Secondary | ICD-10-CM

## 2020-02-24 DIAGNOSIS — U071 COVID-19: Secondary | ICD-10-CM

## 2020-02-24 LAB — COMPREHENSIVE METABOLIC PANEL
ALT: 46 U/L — ABNORMAL HIGH (ref 0–44)
AST: 25 U/L (ref 15–41)
Albumin: 3.6 g/dL (ref 3.5–5.0)
Alkaline Phosphatase: 65 U/L (ref 38–126)
Anion gap: 9 (ref 5–15)
BUN: 14 mg/dL (ref 6–20)
CO2: 28 mmol/L (ref 22–32)
Calcium: 9 mg/dL (ref 8.9–10.3)
Chloride: 102 mmol/L (ref 98–111)
Creatinine, Ser: 0.84 mg/dL (ref 0.44–1.00)
GFR, Estimated: >60 mL/min (ref >60)
Glucose, Bld: 143 mg/dL — ABNORMAL HIGH (ref 70–99)
Potassium: 4.3 mmol/L (ref 3.5–5.1)
Sodium: 139 mmol/L (ref 135–145)
Total Bilirubin: 0.9 mg/dL (ref 0.3–1.2)
Total Protein: 6.1 g/dL — ABNORMAL LOW (ref 6.5–8.1)

## 2020-02-24 LAB — CBC WITH DIFFERENTIAL/PLATELET
Abs Immature Granulocytes: 0.04 10*3/uL (ref 0.00–0.07)
Basophils Absolute: 0 10*3/uL (ref 0.0–0.1)
Basophils Relative: 0 %
Eosinophils Absolute: 0 10*3/uL (ref 0.0–0.5)
Eosinophils Relative: 0 %
HCT: 33.7 % — ABNORMAL LOW (ref 36.0–46.0)
Hemoglobin: 11 g/dL — ABNORMAL LOW (ref 12.0–15.0)
Immature Granulocytes: 1 %
Lymphocytes Relative: 6 %
Lymphs Abs: 0.5 10*3/uL — ABNORMAL LOW (ref 0.7–4.0)
MCH: 30.8 pg (ref 26.0–34.0)
MCHC: 32.6 g/dL (ref 30.0–36.0)
MCV: 94.4 fL (ref 80.0–100.0)
Monocytes Absolute: 0.1 10*3/uL (ref 0.1–1.0)
Monocytes Relative: 2 %
Neutro Abs: 7.4 10*3/uL (ref 1.7–7.7)
Neutrophils Relative %: 91 %
Platelets: 332 10*3/uL (ref 150–400)
RBC: 3.57 MIL/uL — ABNORMAL LOW (ref 3.87–5.11)
RDW: 14.3 % (ref 11.5–15.5)
WBC: 8 10*3/uL (ref 4.0–10.5)
nRBC: 0 % (ref 0.0–0.2)

## 2020-02-24 LAB — ECHOCARDIOGRAM COMPLETE
AR max vel: 2.34 cm2
AV Area VTI: 2.31 cm2
AV Area mean vel: 2.16 cm2
AV Mean grad: 2 mmHg
AV Peak grad: 3.8 mmHg
Ao pk vel: 0.97 m/s
Area-P 1/2: 6.37 cm2
Height: 68 in
S' Lateral: 2.74 cm
Weight: 3200 oz

## 2020-02-24 LAB — MAGNESIUM
Magnesium: 2.2 mg/dL (ref 1.7–2.4)
Magnesium: 2.2 mg/dL (ref 1.7–2.4)

## 2020-02-24 LAB — SEDIMENTATION RATE: Sed Rate: 6 mm/hr (ref 0–20)

## 2020-02-24 LAB — PHOSPHORUS: Phosphorus: 2.9 mg/dL (ref 2.5–4.6)

## 2020-02-24 LAB — CORTISOL-AM, BLOOD: Cortisol - AM: 5.8 ug/dL — ABNORMAL LOW (ref 6.7–22.6)

## 2020-02-24 LAB — TROPONIN I (HIGH SENSITIVITY): Troponin I (High Sensitivity): 3 ng/L (ref <18)

## 2020-02-24 LAB — C-REACTIVE PROTEIN: CRP: 0.5 mg/dL (ref <1.0)

## 2020-02-24 MED ORDER — CYCLOBENZAPRINE HCL 10 MG PO TABS
ORAL_TABLET | ORAL | Status: AC
  Administered 2020-02-24: 10 mg via ORAL
  Filled 2020-02-24: qty 1

## 2020-02-24 MED ORDER — CYCLOBENZAPRINE HCL 10 MG PO TABS
10.0000 mg | ORAL_TABLET | Freq: Every evening | ORAL | Status: DC
Start: 2020-02-24 — End: 2020-02-24

## 2020-02-24 NOTE — Progress Notes (Signed)
*  PRELIMINARY RESULTS* Echocardiogram 2D Echocardiogram has been performed.  Alicia [name] 02/24/2020, 9:31 AM

## 2020-02-24 NOTE — Discharge Summary (Signed)
Physician Discharge Summary  Chrys Landgrebe Note Bazin UXN:[id number] DOB: March 31, 1970 DOA: 02/23/2020  PCP: Alba Cory, MD  Admit date: 02/23/2020 Discharge date: 02/24/2020  Admitted From: home Disposition:  home  Recommendations for Outpatient Follow-up:  1. Follow up with PCP in 1-2 weeks 2. Obtain a 2D echocardiogram as an outpatient  Home Health: none Equipment/Devices: home O2  Discharge Condition: stable CODE STATUS: Full code Diet recommendation: regular  HPI: Per admitting MD, [name] Note Alicia [name] is a 50 y.o. female with medical history significant for obstructive sleep apnea on CPAP, hyperlipidemia, GERD, chronic sinus tachycardia, recent hospitalization for acute hypoxic respiratory failure in setting of severe COVID-19 pneumonia positive COVID-19 PCR on 02/04/2020, who is admitted to [facility] on 02/23/2020 with hypotension after presenting from home to [facility] Emergency Department complaining of dizziness.  Context of recent hospitalization at [facility] from 02/07/2020 to 02/19/2020 for acute hypoxic respiratory failure in setting of severe COVID-19 pneumonia, the patient presents to [facility] ED today complaining of 1 to 2 days of dizziness.  She reports her dizziness has been intermittent over that timeframe, and frequently occurs when attempting to stand from a seated position, associated with mild lightheadedness but denies any associated syncope.  Denies any associated vertigo or preceding trauma. Following her discharge from the hospital on 02/19/2020 upon which she was discharged on 3 L nasal cannula, with supplemental oxygen requirement new during this previous hospitalization for COVID, she has noted intermittent shortness of breath, that she feels may be slightly worse over the last 1 to 2 days.  Denies any associated orthopnea, PND, or worsening peripheral edema.  She also continues to have a mild nonproductive cough, which she feels is approximately as frequent as  intense as with which she left the hospital on 02/19/2020.  Not associate with any hemoptysis.  Denies any recent calf tenderness or lower extremity erythema.  Denies any associated subjective fever, chills, rigors, or generalized myalgias.  Denies any recent dysuria, gross hematuria, or change in urinary urgency/frequency. She also reports new onset sharp, achy left-sided chest discomfort over the last 1 to 2 days, which she states is worse with cough or deep inspiration.  She also reports that the left-sided chest discomfort appears to worsen when laying supine, and improves when sitting up or leaning forward.  The chest pain is nonexertional, and resolve spontaneously without need for any pharmacologic intervention.  She notes mild intermittent nausea over the last few days in the absence of any associated vomiting.  She denies any associated palpitations or diaphoresis. Per chart review, patient has a documented history of recurrent orthostatic hypotension.  She is not currently on any antihypertensive medications at home.  She also has a history of chronic sinus tachycardia, for which she was previously on a beta-blocker, which was discontinued at the time of her hospitalization for COVID-19 pneumonia.  Reports baseline heart rates in the low 100s, stating that her heart rate rarely descendents below 100 bpm. She conveys poor p.o. intake following discharge from hospital on 122 in the context of poor appetite as well as apprehension intermittent nausea in the absence of vomiting.  She denies any recent abdominal pain, diarrhea, melena, or hematochezia.  Hospital Course / Discharge diagnoses: Principal problem Chest pain-patient was admitted to the hospital with pleuritic type chest pain.  She underwent a CT angiogram which was negative for PE but did show changes consistent with post Covid pneumonitis.  High-sensitivity troponin was negative ruling out an NSTEMI.  Her  BNP was normal.  A 2D echo has been  planned however it was unable to be completed due to lack of IV access.  Patient asking to go home as she has an appointment with her son this afternoon.  She has no CHF findings, normal BNP, normal high-sensitivity troponin and echo can be done as an outpatient.  She will be discharged home in stable condition.  She was placed empirically on antibiotics for a few days to cover for secondary bacterial pneumonia due to elevated WBC and elevated lactic acid.  Her WBC and lactic acid have normalized.  Her chest pain is likely due to pleurisy from post Covid pneumonitis  Active problems Hypotension-likely due to dehydration, she received IV fluids, blood pressure improved, she is able to eat and drink well this morning and was encouraged to continue p.o. intake. Acute kidney injury-creatinine improved with fluids Chronic sinus tach-outpatient management Hyperlipidemia-continue statin Depression-continue home medications  Sepsis ruled out   Discharge Instructions   Allergies as of 02/24/2020      Reactions   Azithromycin Diarrhea   Penicillins Hives, Swelling   Has patient had a PCN reaction causing immediate rash, facial/tongue/throat swelling, SOB or lightheadedness with hypotension: Yes Has patient had a PCN reaction causing severe rash involving mucus membranes or skin necrosis: No Has patient had a PCN reaction that required hospitalization No Has patient had a PCN reaction occurring within the last 10 years: No If all of the above answers are "NO", then may proceed with Cephalosporin use.      Medication List    TAKE these medications   albuterol 108 (90 Base) MCG/ACT inhaler Commonly known as: VENTOLIN HFA Inhale 2 puffs into the lungs every 6 (six) hours as needed for wheezing or shortness of breath.   ALPRAZolam 1 MG 24 hr tablet Commonly known as: XANAX XR Take 1 tablet (1 mg total) by mouth every morning.   ascorbic acid 500 MG tablet Commonly known as: VITAMIN C Take 1  tablet (500 mg total) by mouth daily.   aspirin EC 81 MG tablet Take 81 mg by mouth daily.   cefdinir 300 MG capsule Commonly known as: OMNICEF Take 1 capsule (300 mg total) by mouth 2 (two) times daily for 7 days.   cyclobenzaprine 10 MG tablet Commonly known as: FLEXERIL Take 1 tablet (10 mg total) by mouth every evening.   doxycycline 100 MG capsule Commonly known as: VIBRAMYCIN Take 1 capsule (100 mg total) by mouth 2 (two) times daily for 7 days.   guaiFENesin-dextromethorphan 100-10 MG/5ML syrup Commonly known as: ROBITUSSIN DM Take 10 mLs by mouth every 4 (four) hours as needed for cough.   HYDROcodone-acetaminophen 5-325 MG tablet Commonly known as: NORCO/VICODIN Take 1 tablet by mouth every 6 (six) hours as needed for severe pain.   icosapent Ethyl 1 g capsule Commonly known as: VASCEPA TAKE 1 CAPSULE BY MOUTH EVERY MORNING, AT NOON, AND AT BEDTIME   lamoTRIgine 25 MG tablet Commonly known as: LAMICTAL Take 25 mg by mouth 2 (two) times daily.   pantoprazole 40 MG tablet Commonly known as: PROTONIX Take 1 tablet (40 mg total) by mouth every morning.   PARoxetine 20 MG tablet Commonly known as: PAXIL Take 3 tablets (60 mg total) by mouth daily.   predniSONE 10 MG tablet Commonly known as: DELTASONE Take 4 tablets (40 mg total) by mouth daily for 2 days, THEN 3 tablets (30 mg total) daily for 2 days, THEN 2 tablets (20 mg total)  daily for 2 days, THEN 1 tablet (10 mg total) daily for 2 days. Start taking on: February 19, 2020   rosuvastatin 40 MG tablet Commonly known as: CRESTOR Take 1 tablet (40 mg total) by mouth daily.   Ubrelvy 100 MG Tabs Generic drug: Ubrogepant Take 100 mg by mouth daily as needed (migraine).   Vitamin D (Ergocalciferol) 1.25 MG (50000 UNIT) Caps capsule Commonly known as: DRISDOL Take 1 capsule (50,000 Units total) by mouth every 7 (seven) days.   zinc sulfate 220 (50 Zn) MG capsule Take 1 capsule (220 mg total) by mouth  daily.   zolpidem 12.5 MG CR tablet Commonly known as: Ambien CR Take 1 tablet (12.5 mg total) by mouth at bedtime as needed for sleep.       Follow-up Information    Alba Cory, MD .   Specialty: [facility] Contact information: [address] [place] [address] [phone]               Consultations:  None   Procedures/Studies:  DG Chest 2 View  Result Date: 02/04/2020 CLINICAL DATA:  COVID-19 positivity with congestion, initial encounter EXAM: CHEST - 2 VIEW COMPARISON:  09/30/2018 FINDINGS: Cardiac shadow is within normal limits. Patchy opacity is noted in the right base may be related to the given clinical history of a may simply represent atelectasis. No bony abnormality is seen. No effusion is noted. IMPRESSION: Minimal airspace opacity in the right base as described. Electronically Signed   By: Alcide Clever M.D.   On: 02/04/2020 14:26   CT Angio Chest PE W and/or Wo Contrast  Result Date: 02/23/2020 CLINICAL DATA:  Onset chest pain and shortness of breath this morning. History of COVID-19 pneumonia. EXAM: CT ANGIOGRAPHY CHEST WITH CONTRAST TECHNIQUE: Multidetector CT imaging of the chest was performed using the standard protocol during bolus administration of intravenous contrast. Multiplanar CT image reconstructions and MIPs were obtained to evaluate the vascular anatomy. CONTRAST:  75 mL OMNIPAQUE IOHEXOL 350 MG/ML SOLN COMPARISON:  Single-view of the chest today and 02/07/2020. CT chest 02/07/2020. FINDINGS: Cardiovascular: Satisfactory opacification of the pulmonary arteries to the segmental level. No evidence of pulmonary embolism. Normal heart size. No pericardial effusion. Mediastinum/Nodes: No enlarged mediastinal, hilar, or axillary lymph nodes. Thyroid gland, trachea, and esophagus demonstrate no significant findings. Lungs/Pleura: No pleural effusion. There is ground-glass attenuating airspace disease in all lobes of both lungs. The density of  airspace disease has improved since the prior CT although it is somewhat more extensive. Upper Abdomen: Status post cholecystectomy. Musculoskeletal: No acute or focal abnormality. Review of the MIP images confirms the above findings. IMPRESSION: Negative for pulmonary embolus. Extensive bilateral airspace disease consistent with COVID-19 infection persists but does show some improvement since prior chest CT. Electronically Signed   By: Drusilla Kanner M.D.   On: 02/23/2020 10:12   CT Angio Chest PE W and/or Wo Contrast  Result Date: 02/07/2020 CLINICAL DATA:  COVID-19 positive 2 days ago, short of breath after antibody infusion, lower abdominal pain, positive D-dimer EXAM: CT ANGIOGRAPHY CHEST CT ABDOMEN AND PELVIS WITH CONTRAST TECHNIQUE: Multidetector CT imaging of the chest was performed using the standard protocol during bolus administration of intravenous contrast. Multiplanar CT image reconstructions and MIPs were obtained to evaluate the vascular anatomy. Multidetector CT imaging of the abdomen and pelvis was performed using the standard protocol during bolus administration of intravenous contrast. CONTRAST:  34mL OMNIPAQUE IOHEXOL 350 MG/ML SOLN COMPARISON:  02/07/2020, 05/05/2019 FINDINGS: CTA CHEST FINDINGS Cardiovascular: There  is suboptimal contrast enhancement of the pulmonary vasculature, resulting in a nondiagnostic evaluation for pulmonary embolus. The majority of the contrast is within the systemic arterial system, and the thoracic aorta is normal in appearance. Heart is unremarkable without pericardial effusion. Mediastinum/Nodes: No enlarged mediastinal, hilar, or axillary lymph nodes. Thyroid gland, trachea, and esophagus demonstrate no significant findings. Lungs/Pleura: Multifocal bilateral ground-glass airspace disease consistent with COVID 19 pneumonia. No effusion or pneumothorax. The central airways are patent. Musculoskeletal: No acute or destructive bony lesions. Reconstructed  images demonstrate no additional findings. Review of the MIP images confirms the above findings. CT ABDOMEN and PELVIS FINDINGS Hepatobiliary: No focal liver abnormality is seen. Status post cholecystectomy. No biliary dilatation. Pancreas: Stable cysts within the uncinate process of the pancreas measuring 1.6 x 1.0 cm, previously shown to reflect a serous cystadenoma. The remainder of the pancreas is unremarkable. Spleen: Normal in size without focal abnormality. Adrenals/Urinary Tract: The kidneys enhance normally and symmetrically. No evidence of obstructive uropathy or urinary calculus. Bladder is unremarkable. The adrenals are normal. Stomach/Bowel: No bowel obstruction or ileus. Normal appendix. No bowel wall thickening or inflammatory changes. Vascular/Lymphatic: Left-sided IVC again noted, an anatomic variant. No significant atherosclerosis. No pathologic adenopathy. Reproductive: Status post hysterectomy. No adnexal masses. Other: There is trace pelvic free fluid, nonspecific. No free intraperitoneal gas. No abdominal wall hernia. Musculoskeletal: No acute bony abnormalities. Reconstructed images demonstrate no additional findings. Review of the MIP images confirms the above findings. IMPRESSION: 1. Nondiagnostic evaluation of the pulmonary vasculature due to timing of contrast bolus. Pulmonary emboli cannot be excluded. 2. Multifocal bilateral airspace disease consistent with COVID 19 pneumonia. 3. Trace pelvic free fluid, likely physiologic. 4. Stable pancreatic cyst, previously shown to represent a serous cystadenoma. 5. Otherwise no acute intra-abdominal or intrapelvic process. Electronically Signed   By: Sharlet Salina M.D.   On: 02/07/2020 21:56   NM Pulmonary Perfusion  Result Date: 02/08/2020 CLINICAL DATA:  50 year old female with elevated D-dimer. Positive COVID-19. EXAM: NUCLEAR MEDICINE PERFUSION LUNG SCAN TECHNIQUE: Perfusion images were obtained in multiple projections after intravenous  injection of radiopharmaceutical. Ventilation scans intentionally deferred if perfusion scan and chest x-ray adequate for interpretation during COVID 19 epidemic. RADIOPHARMACEUTICALS:  3.934 mCi Tc-50m MAA IV COMPARISON:  Chest radiograph dated 02/07/2020 and CT dated 02/07/2020 FINDINGS: Small areas of peripheral heterogeneity and defect primarily in the periphery and subpleural lung corresponds to the opacity seen on the chest radiograph. No large or segmental perfusion defect identified. IMPRESSION: Low probability for pulmonary embolism. Electronically Signed   By: Elgie Collard M.D.   On: 02/08/2020 17:44   CT ABDOMEN PELVIS W CONTRAST  Result Date: 02/07/2020 CLINICAL DATA:  COVID-19 positive 2 days ago, short of breath after antibody infusion, lower abdominal pain, positive D-dimer EXAM: CT ANGIOGRAPHY CHEST CT ABDOMEN AND PELVIS WITH CONTRAST TECHNIQUE: Multidetector CT imaging of the chest was performed using the standard protocol during bolus administration of intravenous contrast. Multiplanar CT image reconstructions and MIPs were obtained to evaluate the vascular anatomy. Multidetector CT imaging of the abdomen and pelvis was performed using the standard protocol during bolus administration of intravenous contrast. CONTRAST:  37mL OMNIPAQUE IOHEXOL 350 MG/ML SOLN COMPARISON:  02/07/2020, 05/05/2019 FINDINGS: CTA CHEST FINDINGS Cardiovascular: There is suboptimal contrast enhancement of the pulmonary vasculature, resulting in a nondiagnostic evaluation for pulmonary embolus. The majority of the contrast is within the systemic arterial system, and the thoracic aorta is normal in appearance. Heart is unremarkable without pericardial effusion. Mediastinum/Nodes: No enlarged mediastinal, hilar,  or axillary lymph nodes. Thyroid gland, trachea, and esophagus demonstrate no significant findings. Lungs/Pleura: Multifocal bilateral ground-glass airspace disease consistent with COVID 19 pneumonia. No  effusion or pneumothorax. The central airways are patent. Musculoskeletal: No acute or destructive bony lesions. Reconstructed images demonstrate no additional findings. Review of the MIP images confirms the above findings. CT ABDOMEN and PELVIS FINDINGS Hepatobiliary: No focal liver abnormality is seen. Status post cholecystectomy. No biliary dilatation. Pancreas: Stable cysts within the uncinate process of the pancreas measuring 1.6 x 1.0 cm, previously shown to reflect a serous cystadenoma. The remainder of the pancreas is unremarkable. Spleen: Normal in size without focal abnormality. Adrenals/Urinary Tract: The kidneys enhance normally and symmetrically. No evidence of obstructive uropathy or urinary calculus. Bladder is unremarkable. The adrenals are normal. Stomach/Bowel: No bowel obstruction or ileus. Normal appendix. No bowel wall thickening or inflammatory changes. Vascular/Lymphatic: Left-sided IVC again noted, an anatomic variant. No significant atherosclerosis. No pathologic adenopathy. Reproductive: Status post hysterectomy. No adnexal masses. Other: There is trace pelvic free fluid, nonspecific. No free intraperitoneal gas. No abdominal wall hernia. Musculoskeletal: No acute bony abnormalities. Reconstructed images demonstrate no additional findings. Review of the MIP images confirms the above findings. IMPRESSION: 1. Nondiagnostic evaluation of the pulmonary vasculature due to timing of contrast bolus. Pulmonary emboli cannot be excluded. 2. Multifocal bilateral airspace disease consistent with COVID 19 pneumonia. 3. Trace pelvic free fluid, likely physiologic. 4. Stable pancreatic cyst, previously shown to represent a serous cystadenoma. 5. Otherwise no acute intra-abdominal or intrapelvic process. Electronically Signed   By: Sharlet SalinaMichael  Brown M.D.   On: 02/07/2020 21:56   US Venous Img Lower Bilateral (DVT)  Result Date: 02/09/2020 CLINICAL DATA:  Bilateral lower extremity pain and edema.  Elevated D-dimer. Evaluate for DVT. EXAM: BILATERAL LOWER EXTREMITY VENOUS DOPPLER ULTRASOUND TECHNIQUE: Gray-scale sonography with graded compression, as well as color Doppler and duplex ultrasound were performed to evaluate the lower extremity deep venous systems from the level of the common femoral vein and including the common femoral, femoral, profunda femoral, popliteal and calf veins including the posterior tibial, peroneal and gastrocnemius veins when visible. The superficial great saphenous vein was also interrogated. Spectral Doppler was utilized to evaluate flow at rest and with distal augmentation maneuvers in the common femoral, femoral and popliteal veins. COMPARISON:  None. FINDINGS: RIGHT LOWER EXTREMITY Common Femoral Vein: No evidence of thrombus. Normal compressibility, respiratory phasicity and response to augmentation. Saphenofemoral Junction: No evidence of thrombus. Normal compressibility and flow on color Doppler imaging. Profunda Femoral Vein: No evidence of thrombus. Normal compressibility and flow on color Doppler imaging. Femoral Vein: No evidence of thrombus. Normal compressibility, respiratory phasicity and response to augmentation. Popliteal Vein: No evidence of thrombus. Normal compressibility, respiratory phasicity and response to augmentation. Calf Veins: No evidence of thrombus. Normal compressibility and flow on color Doppler imaging. Superficial Great Saphenous Vein: No evidence of thrombus. Normal compressibility. Venous Reflux:  None. Other Findings:  None. LEFT LOWER EXTREMITY Common Femoral Vein: No evidence of thrombus. Normal compressibility, respiratory phasicity and response to augmentation. Saphenofemoral Junction: No evidence of thrombus. Normal compressibility and flow on color Doppler imaging. Profunda Femoral Vein: No evidence of thrombus. Normal compressibility and flow on color Doppler imaging. Femoral Vein: No evidence of thrombus. Normal compressibility,  respiratory phasicity and response to augmentation. Popliteal Vein: No evidence of thrombus. Normal compressibility, respiratory phasicity and response to augmentation. Calf Veins: No evidence of thrombus. Normal compressibility and flow on color Doppler imaging. Superficial Great Saphenous Vein: No evidence of  thrombus. Normal compressibility. Venous Reflux:  None. Other Findings:  None. IMPRESSION: No evidence of DVT within either lower extremity. Electronically Signed   By: Simonne Come M.D.   On: 02/09/2020 14:09   DG Chest Portable 1 View  Result Date: 02/23/2020 CLINICAL DATA:  Chest pain shortness of breath.  COVID positive. EXAM: PORTABLE CHEST 1 VIEW COMPARISON:  CT 02/07/2020.  Chest x-ray 02/07/2020. FINDINGS: Cardiac monitoring device noted over the chest. Mediastinum and hilar structures are unremarkable. Heart size normal. Bilateral interstitial infiltrates again noted. Slight improvement from prior exams. No pleural effusion or pneumothorax. IMPRESSION: Bilateral interstitial infiltrates again noted. Slight improvement from prior exams. Findings consistent with persistent pneumonitis. Electronically Signed   By: Maisie Fus  Register   On: 02/23/2020 06:55   DG Chest Portable 1 View  Result Date: 02/07/2020 CLINICAL DATA:  COVID positive with shortness of breath. EXAM: PORTABLE CHEST 1 VIEW COMPARISON:  February 04, 2020 FINDINGS: Mild multifocal infiltrates are seen involving predominantly the periphery of the mid and lower lung fields, bilaterally. There is no evidence of a pleural effusion or pneumothorax. The heart size and mediastinal contours are within normal limits. A radiopaque loop recorder device is seen. The visualized skeletal structures are unremarkable. IMPRESSION: Mild bilateral multifocal infiltrates. Electronically Signed   By: Aram Candela M.D.   On: 02/07/2020 20:06   CUP PACEART REMOTE DEVICE CHECK  Result Date: 02/13/2020 ILR summary report received. Battery status  OK. Normal device function. No new symptom, tachy, brady, or pause episodes. No new AF episodes. Monthly summary reports and ROV/PRN.  R. Powers, LPN, CVRS.     Subjective: - no chest pain, shortness of breath, no abdominal pain, nausea or vomiting.   Discharge Exam: BP 106/60   Pulse (!) 106   Temp 99.3 F (37.4 C) (Rectal)   Resp 17   Ht 5\' 8"  (1.727 m)   Wt 90.7 kg   LMP 01/15/2015 (Exact Date)   SpO2 94%   BMI 30.41 kg/m   General: Pt is alert, awake, not in acute distress Cardiovascular: RRR, S1/S2 +, no rubs, no gallops Respiratory: CTA bilaterally, no wheezing, no rhonchi Abdominal: Soft, NT, ND, bowel sounds + Extremities: no edema, no cyanosis    The results of significant diagnostics from this hospitalization (including imaging, microbiology, ancillary and laboratory) are listed below for reference.     Microbiology: No results found for this or any previous visit (from the past 240 hour(s)).   Labs: Basic Metabolic Panel: Recent Labs  Lab 02/23/20 0659 02/24/20 0428  NA 138 139  K 4.0 4.3  CL 101 102  CO2 25 28  GLUCOSE 98 143*  BUN 24* 14  CREATININE 1.00 0.84  CALCIUM 9.3 9.0  MG  --  2.2  2.2  PHOS  --  2.9   Liver Function Tests: Recent Labs  Lab 02/24/20 0428  AST 25  ALT 46*  ALKPHOS 65  BILITOT 0.9  PROT 6.1*  ALBUMIN 3.6   CBC: Recent Labs  Lab 02/23/20 0659 02/24/20 0428  WBC 13.8* 8.0  NEUTROABS  --  7.4  HGB 12.5 11.0*  HCT 37.4 33.7*  MCV 91.9 94.4  PLT 480* 332   CBG: Recent Labs  Lab 02/23/20 1939  GLUCAP 119*   Hgb A1c No results for input(s): HGBA1C in the last 72 hours. Lipid Profile No results for input(s): CHOL, HDL, LDLCALC, TRIG, CHOLHDL, LDLDIRECT in the last 72 hours. Thyroid function studies No results for input(s): TSH, T4TOTAL, T3FREE,  THYROIDAB in the last 72 hours.  Invalid input(s): FREET3 Urinalysis    Component Value Date/Time   COLORURINE YELLOW (A) 02/23/2020 1708   APPEARANCEUR  HAZY (A) 02/23/2020 1708   LABSPEC >1.046 (H) 02/23/2020 1708   PHURINE 5.0 02/23/2020 1708   GLUCOSEU NEGATIVE 02/23/2020 1708   HGBUR NEGATIVE 02/23/2020 1708   BILIRUBINUR NEGATIVE 02/23/2020 1708   KETONESUR NEGATIVE 02/23/2020 1708   PROTEINUR 30 (A) 02/23/2020 1708   NITRITE NEGATIVE 02/23/2020 1708   LEUKOCYTESUR NEGATIVE 02/23/2020 1708    FURTHER DISCHARGE INSTRUCTIONS:   Get Medicines reviewed and adjusted: Please take all your medications with you for your next visit with your Primary MD   Laboratory/radiological data: Please request your Primary MD to go over all hospital tests and procedure/radiological results at the follow up, please ask your Primary MD to get all Hospital records sent to his/her office.   In some cases, they will be blood work, cultures and biopsy results pending at the time of your discharge. Please request that your primary care M.D. goes through all the records of your hospital data and follows up on these results.   Also Note the following: If you experience worsening of your admission symptoms, develop shortness of breath, life threatening emergency, suicidal or homicidal thoughts you must seek medical attention immediately by calling 911 or calling your MD immediately  if symptoms less severe.   You must read complete instructions/literature along with all the possible adverse reactions/side effects for all the Medicines you take and that have been prescribed to you. Take any new Medicines after you have completely understood and accpet all the possible adverse reactions/side effects.    Do not drive when taking Pain medications or sleeping medications (Benzodaizepines)   Do not take more than prescribed Pain, Sleep and Anxiety Medications. It is not advisable to combine anxiety,sleep and pain medications without talking with your primary care practitioner   Special Instructions: If you have smoked or chewed Tobacco  in the last 2 yrs please stop  smoking, stop any regular Alcohol  and or any Recreational drug use.   Wear Seat belts while driving.   Please note: You were cared for by a hospitalist during your hospital stay. Once you are discharged, your primary care physician will handle any further medical issues. Please note that NO REFILLS for any discharge medications will be authorized once you are discharged, as it is imperative that you return to your primary care physician (or establish a relationship with a primary care physician if you do not have one) for your post hospital discharge needs so that they can reassess your need for medications and monitor your lab values.  Time coordinating discharge: 35 minutes  SIGNED:  Pamella Pertostin Gherghe, MD, PhD 02/24/2020, 10:56 AM

## 2020-02-24 NOTE — ED Notes (Signed)
Took over care of pt. Pt resting comfortably. Pt in NAD at this time. VSS. Awaiting further orders. Will continue to monitor.  

## 2020-02-24 NOTE — ED Notes (Signed)
Per RN Tresa Endo called and informed bed control patient being discharged to home  1112

## 2020-02-24 NOTE — ED Notes (Addendum)
This RN in another pt's room giving emergency blood. Echo tech, Aurea Graff at bedside requesting this RN at bedside for IV administration. Secretary informed echo tech this RN was initiating/administering emergent blood and it would be some time before this RN could get into room. Secretary informed this RN that Echo tech came out of isolation room stating, "I have waited to , this is unacceptable. I just wasted medicine." Secretary informed Aurea Graff that this RN is still initiating/administering blood and asked for Echo tech's number to come back when this RN is available. Echo tech stating, "I want be coming back, I can't wait an hour, I have other patients." Secretary unable to get her number. Charge RN and Admitting MD made aware of conversation from [employer] and delay in patient care.

## 2020-02-28 ENCOUNTER — Encounter: Payer: Self-pay | Admitting: Family Medicine

## 2020-02-28 NOTE — Progress Notes (Signed)
[facility] Summary Report / Loop Recorder 

## 2020-02-29 ENCOUNTER — Telehealth: Payer: Self-pay | Admitting: Family Medicine

## 2020-02-29 ENCOUNTER — Other Ambulatory Visit: Payer: Self-pay | Admitting: Family Medicine

## 2020-02-29 DIAGNOSIS — Z09 Encounter for follow-up examination after completed treatment for conditions other than malignant neoplasm: Secondary | ICD-10-CM

## 2020-02-29 NOTE — Telephone Encounter (Signed)
Patient called requesting to be scheduled to see Dr. Carlynn Purl this week for a hospital follow up if possible. Patient needs FMLA certification by Friday 03/03/20 if possible  There were no available appointments at the time of the call

## 2020-03-01 ENCOUNTER — Encounter: Payer: Self-pay | Admitting: Family Medicine

## 2020-03-01 NOTE — Telephone Encounter (Signed)
I scheduled this pt a virtual appt but did not realize it was a hospital fu. She is scheduled for . Do I need to reschedule her or are you okay with keeping her appt the way it is? I did ask the pt to upload her fmla paperwork into mychart and pt verbalized understanding.

## 2020-03-01 NOTE — Progress Notes (Signed)
Name: Alicia [name] Note Berringer   MRN: [id number]    DOB: 1971-01-24   Date:03/02/2020       Progress Note  Subjective  Chief Complaint  FMLA Paperwork  I connected with  [name] Note Glade  on 03/02/20 at  9:00 AM EST by a video enabled telemedicine application and verified that I am speaking with the correct person using two identifiers.  I discussed the limitations of evaluation and management by telemedicine and the availability of in person appointments. The patient expressed understanding and agreed to proceed with the virtual visit  Staff also discussed with the patient that there may be a patient responsible charge related to this service. Patient Location: at home  Provider Location: [facility] Additional Individuals present: son   HPI  Hospital discharge follow up:   Lilliana was diagnosed with COVID-19 on 02/04/2020, had infusion on 02/07/2020 Bamlanivimab and Etesevimab , that same day she developed worsening of SOB and chest pain, she wetn tto EC at [facility] She was hypoxic and on CXR she was found to have multifocal pneumonia. High lactic acid, CT negative for PE. She was admitted on 02/07/2020 and discharged on 02/17/2020 . During her stay she had Remdesevir . She was advised to have a hospital follow up with re-assess need to resume coreg and recheck CMP since LFT's were high during her stay, however on the day of her follow up she noticed another episode of tachycardia and hypoxia and went back to [facility] on 02/23/2020. She was given antibitotics and IV fluids, cardiac enzymes and labs were normal , she had an Echo that showed normal EF, CT improving of pneumonia. She was told likely secondary to scars and pleuritis. She states since she has been home most of the symptoms have resolved however she still has hand tremors , dizziness secondary to orthostatic changes, mild headache and still has some pleuritic chest pain. She has been on nasal cannula oxygen to control her SOB  She has follow up  scheduled with pulmonologist and cardiologist. She needs FMLA forms filled out   Patient Active Problem List   Diagnosis Date Noted  . Hypotension 02/23/2020  . Atypical chest pain 02/23/2020  . AKI (acute kidney injury) (HCC) 02/23/2020  . Acute hypoxemic respiratory failure (HCC) 02/09/2020  . Pneumonia due to COVID-19 virus 02/07/2020  . Reactive airways dysfunction syndrome (HCC) 10/15/2019  . Hyperfunction of pituitary gland, unspecified (HCC) 10/15/2019  . Abdominal pain 05/31/2019  . Calculus of gallbladder without cholecystitis without obstruction 05/06/2019  . Cyst of pancreas 05/06/2019  . Right kidney stone 05/06/2019  . Leukocytoclastic vasculitis (HCC) 10/07/2018  . Chronic insomnia 02/09/2018  . History of anemia 02/09/2018  . Sleep apnea 09/24/2017  . History of stroke 05/30/2017  . Hyperlipidemia 05/30/2017  . Migraine without aura and without status migrainosus, not intractable 05/27/2017  . Pain in right knee 01/21/2017  . Psychogenic nonepileptic seizure   . Chronic pain syndrome   . Restless leg syndrome   . Chronic prescription benzodiazepine use 01/20/2016  . Leukocytosis 01/20/2016  . Seizure-like activity (HCC)   . GAD (generalized anxiety disorder) 02/14/2015  . Chronic neck pain 02/14/2015  . History of hysterectomy 02/14/2015  . Grieving 12/16/2014  . Iron deficiency anemia due to chronic blood loss 08/29/2014  . Insomnia, persistent 08/14/2014  . Major depression (HCC) 08/14/2014  . Temporomandibular joint sounds on opening and/or closing the jaw 08/14/2014  . Degenerative disc disease, lumbar 08/14/2014  . Bleeding internal hemorrhoids 08/14/2014  .  Gastric reflux 08/14/2014  . Blood glucose elevated 08/14/2014  . Irritable bowel syndrome with constipation 08/14/2014  . Hypertriglyceridemia 08/14/2014  . Overweight 08/14/2014  . Tinnitus 08/14/2014  . Vitamin D deficiency 08/14/2014  . Sinus tachycardia 11/25/2012  . DOE (dyspnea on  exertion) 11/06/2012    Past Surgical History:  Procedure Laterality Date  . ABDOMINOPLASTY  Feb. 2015  . CESAREAN SECTION  2011  . CHOLECYSTECTOMY N/A 05/31/2019   Procedure: LAPAROSCOPIC CHOLECYSTECTOMY WITH INTRAOPERATIVE CHOLANGIOGRAM;  Surgeon: Earline Mayotte, MD;  Location: [facility] ORS;  Service: General;  Laterality: N/A;  . COLONOSCOPY WITH PROPOFOL N/A 11/26/2016   Procedure: COLONOSCOPY WITH PROPOFOL;  Surgeon: Wyline Mood, MD;  Location: [facility] ENDOSCOPY;  Service: Gastroenterology;  Laterality: N/A;  . CYSTOSCOPY  02/02/2015   Procedure: CYSTOSCOPY;  Surgeon: Nadara Mustard, MD;  Location: [facility] ORS;  Service: Gynecology;;  . Joya Gaskins AND CURETTAGE OF UTERUS  2003, 2005, 2008  . ESOPHAGOGASTRODUODENOSCOPY N/A 05/31/2019   Procedure: ESOPHAGOGASTRODUODENOSCOPY (EGD);  Surgeon: Earline Mayotte, MD;  Location: [facility] ORS;  Service: General;  Laterality: N/A;  in the O.R.  . ESOPHAGOGASTRODUODENOSCOPY (EGD) WITH PROPOFOL N/A 11/26/2016   Procedure: ESOPHAGOGASTRODUODENOSCOPY (EGD) WITH PROPOFOL;  Surgeon: Wyline Mood, MD;  Location: [facility] ENDOSCOPY;  Service: Gastroenterology;  Laterality: N/A;  . HERNIA REPAIR  1999   Umbilical  . KNEE ARTHROSCOPY Right 2006  . LAPAROSCOPIC BILATERAL SALPINGECTOMY Bilateral 02/02/2015   Procedure: LAPAROSCOPIC BILATERAL SALPINGECTOMY;  Surgeon: Nadara Mustard, MD;  Location: [facility] ORS;  Service: Gynecology;  Laterality: Bilateral;  . LAPAROSCOPIC HYSTERECTOMY N/A 02/02/2015   Procedure: HYSTERECTOMY TOTAL LAPAROSCOPIC;  Surgeon: Nadara Mustard, MD;  Location: [facility] ORS;  Service: Gynecology;  Laterality: N/A;  . LOOP RECORDER INSERTION N/A 12/20/2016   Procedure: LOOP RECORDER INSERTION;  Surgeon: Regan Lemming, MD;  Location: [facility] INVASIVE CV LAB;  Service: Cardiovascular;  Laterality: N/A;  . REDUCTION MAMMAPLASTY Bilateral December 2015  . TEE WITHOUT CARDIOVERSION N/A 12/20/2016   Procedure: TRANSESOPHAGEAL ECHOCARDIOGRAM (TEE);  Surgeon:  Lars Masson, MD;  Location: [facility] ENDOSCOPY;  Service: Cardiovascular;  Laterality: N/A;    Family History  Problem Relation Age of Onset  . Hypertension Mother   . Hyperlipidemia Mother   . Heart Problems Father        hole in heart and lower ventricles reversed  . Prostate cancer Maternal Grandfather   . Von Willebrand disease Maternal Uncle       Current Outpatient Medications:  .  albuterol (VENTOLIN HFA) 108 (90 Base) MCG/ACT inhaler, Inhale 2 puffs into the lungs every 6 (six) hours as needed for wheezing or shortness of breath., Disp: 8 g, Rfl: 2 .  ALPRAZolam (XANAX XR) 1 MG 24 hr tablet, Take 1 tablet (1 mg total) by mouth every morning., Disp: 90 tablet, Rfl: 0 .  ascorbic acid (VITAMIN C) 500 MG tablet, Take 1 tablet (500 mg total) by mouth daily., Disp: 30 tablet, Rfl: 0 .  aspirin EC 81 MG tablet, Take 81 mg by mouth daily., Disp: , Rfl:  .  guaiFENesin-dextromethorphan (ROBITUSSIN DM) 100-10 MG/5ML syrup, Take 10 mLs by mouth every 4 (four) hours as needed for cough., Disp: 118 mL, Rfl: 0 .  icosapent Ethyl (VASCEPA) 1 g capsule, TAKE 1 CAPSULE BY MOUTH EVERY MORNING, AT NOON, AND AT BEDTIME, Disp: 270 capsule, Rfl: 1 .  lamoTRIgine (LAMICTAL) 25 MG tablet, Take 25 mg by mouth 2 (two) times daily., Disp: , Rfl:  .  pantoprazole (PROTONIX) 40 MG tablet,  Take 1 tablet (40 mg total) by mouth every morning., Disp: 90 tablet, Rfl: 1 .  PARoxetine (PAXIL) 20 MG tablet, Take 3 tablets (60 mg total) by mouth daily., Disp: 270 tablet, Rfl: 1 .  rosuvastatin (CRESTOR) 40 MG tablet, Take 1 tablet (40 mg total) by mouth daily., Disp: 90 tablet, Rfl: 1 .  UBRELVY 100 MG TABS, Take 100 mg by mouth daily as needed (migraine). , Disp: , Rfl:  .  Vitamin D, Ergocalciferol, (DRISDOL) 1.25 MG (50000 UNIT) CAPS capsule, Take 1 capsule (50,000 Units total) by mouth every 7 (seven) days., Disp: 12 capsule, Rfl: 1 .  zinc sulfate 220 (50 Zn) MG capsule, Take 1 capsule (220 mg total) by mouth  daily., Disp: 30 capsule, Rfl: 0 .  zolpidem (AMBIEN CR) 12.5 MG CR tablet, Take 1 tablet (12.5 mg total) by mouth at bedtime as needed for sleep., Disp: 90 tablet, Rfl: 0 .  cyclobenzaprine (FLEXERIL) 5 MG tablet, Take 1 tablet (5 mg total) by mouth every evening., Disp: 90 tablet, Rfl: 0  Allergies  Allergen Reactions  . Azithromycin Diarrhea  . Penicillins Hives and Swelling    Has patient had a PCN reaction causing immediate rash, facial/tongue/throat swelling, SOB or lightheadedness with hypotension: Yes Has patient had a PCN reaction causing severe rash involving mucus membranes or skin necrosis: No Has patient had a PCN reaction that required hospitalization No Has patient had a PCN reaction occurring within the last 10 years: No If all of the above answers are "NO", then may proceed with Cephalosporin use.    I personally reviewed active problem list, medication list, allergies, family history, social history, health maintenance with the patient/caregiver today.   ROS  Ten systems reviewed and is negative except as mentioned in HPI   Objective  Virtual encounter, vitals not obtained.  Body mass index is 28.43 kg/m.  Physical Exam  Awake, alert and oriented, hand tremors   PHQ2/9: Depression screen [facility] 2/9 03/02/2020 02/04/2020 01/04/2020 01/04/2020 10/15/2019  Decreased Interest 0 0 1 1 1   Down, Depressed, Hopeless 0 0 1 1 1   PHQ - 2 Score 0 0 2 2 2   Altered sleeping 0 3 0 - 0  Tired, decreased energy 1 3 1  - 1  Change in appetite 1 3 0 - 1  Feeling bad or failure about yourself  0 0 1 - 0  Trouble concentrating 1 0 0 - 0  Moving slowly or fidgety/restless 0 0 0 - 0  Suicidal thoughts 0 0 0 - 0  PHQ-9 Score 3 9 4  - 4  Difficult doing work/chores Not difficult at all Somewhat difficult Somewhat difficult - Not difficult at all  Some recent data might be hidden   PHQ-2/9 Result is positive.    Fall Risk: Fall Risk  03/02/2020 02/04/2020 01/04/2020 10/15/2019  07/22/2019  Falls in the past year? 1 0 1 1 0  Number falls in past yr: 0 0 0 1 -  Injury with Fall? 0 0 0 0 -  Comment - - - - -  Risk for fall due to : - - - - -  Follow up - - - - -     Assessment & Plan   1. Hospital discharge follow-up  Keep follow up with sub-specialist, keep follow up with me in one month.   2. Pneumonia due to COVID-19 virus  Reviewed medications, finished antibiotics ( omnicef and doxy ) this morning.   3. Chronic neck pain  She  decreased dose of flexeril , afraid of respiratory suppression - cyclobenzaprine (FLEXERIL) 5 MG tablet; Take 1 tablet (5 mg total) by mouth every evening.  Dispense: 90 tablet; Refill: 0  I discussed the assessment and treatment plan with the patient. The patient was provided an opportunity to ask questions and all were answered. The patient agreed with the plan and demonstrated an understanding of the instructions.  The patient was advised to call back or seek an in-person evaluation if the symptoms worsen or if the condition fails to improve as anticipated.  I provided 25 minutes of non-face-to-face time during this encounter.

## 2020-03-02 ENCOUNTER — Encounter: Payer: Self-pay | Admitting: Family Medicine

## 2020-03-02 ENCOUNTER — Telehealth (INDEPENDENT_AMBULATORY_CARE_PROVIDER_SITE_OTHER): Payer: BC Managed Care – PPO | Admitting: Family Medicine

## 2020-03-02 VITALS — BP 95/70 | Temp 98.6°F | Wt 187.0 lb

## 2020-03-02 DIAGNOSIS — M542 Cervicalgia: Secondary | ICD-10-CM | POA: Diagnosis not present

## 2020-03-02 DIAGNOSIS — U071 COVID-19: Secondary | ICD-10-CM | POA: Diagnosis not present

## 2020-03-02 DIAGNOSIS — Z09 Encounter for follow-up examination after completed treatment for conditions other than malignant neoplasm: Secondary | ICD-10-CM

## 2020-03-02 DIAGNOSIS — G8929 Other chronic pain: Secondary | ICD-10-CM

## 2020-03-02 DIAGNOSIS — J1282 Pneumonia due to coronavirus disease 2019: Secondary | ICD-10-CM | POA: Diagnosis not present

## 2020-03-02 MED ORDER — CYCLOBENZAPRINE HCL 5 MG PO TABS: 5.0000 mg | ORAL_TABLET | Freq: Every evening | ORAL | 0 refills | Status: DC

## 2020-03-06 ENCOUNTER — Ambulatory Visit: Payer: BC Managed Care – PPO | Admitting: Dermatology

## 2020-03-16 ENCOUNTER — Encounter: Payer: Self-pay | Admitting: Cardiology

## 2020-03-16 ENCOUNTER — Other Ambulatory Visit: Payer: Self-pay

## 2020-03-16 ENCOUNTER — Telehealth: Payer: Self-pay | Admitting: *Deleted

## 2020-03-16 ENCOUNTER — Ambulatory Visit: Payer: BC Managed Care – PPO | Admitting: Cardiology

## 2020-03-16 VITALS — BP 122/82 | HR 118 | Ht 68.0 in | Wt 190.5 lb

## 2020-03-16 DIAGNOSIS — I639 Cerebral infarction, unspecified: Secondary | ICD-10-CM

## 2020-03-16 DIAGNOSIS — R000 Tachycardia, unspecified: Secondary | ICD-10-CM | POA: Diagnosis not present

## 2020-03-16 DIAGNOSIS — I951 Orthostatic hypotension: Secondary | ICD-10-CM

## 2020-03-16 MED ORDER — IVABRADINE HCL 5 MG PO TABS: 5.0000 mg | ORAL_TABLET | Freq: Two times a day (BID) | ORAL | 5 refills | Status: DC

## 2020-03-16 NOTE — Progress Notes (Addendum)
Cardiology Office Note:    Date:  03/16/2020   ID:  Alicia [name], DOB 01/12/1971, MRN [id number]  PCP:  Sowles, [name], MD  [facility] Cardiologist:  Analeise Mccleery Agbor-Etang, MD  [facility] Electrophysiologist:  None   Referring MD: Sowles, [name], MD   Chief Complaint  Patient presents with  . Other    C/o sob and hypertension. Meds reviewed verbally with pt.   Alicia [name] is a 49 y.o. female who is being seen today for the evaluation of strokes and hypertension at the request of Sowles, [name], MD.   History of Present Illness:    Alicia [name] is a 49 y.o. female with a hx of anxiety, hyperlipidemia, CVA, TIA who presents due to strokelike BP issues.  Patient first diagnosed with strokes in 2018.  At that time, she had dizziness, nausea, left-sided weakness.  Also has a history of seizures.  Had another episode of CVA in 2019 with left-sided arm and leg weakness.  Loop recorder was placed in 2018, last checked a month ago without any evidence of A. fib or flutter.  Also states having orthostasis for the past couple of years, and tachycardia.  Was placed on Coreg to help with heart rate control, but this made her orthostasis worse.  Coreg was stopped, her blood pressures improved, but she still gets dizzy when standing for long.  Sometimes, associated with visual issues, and syncope.  Patient was diagnosed with COVID-19 pneumonia on 01/2020 after presenting to the hospital with shortness of breath.  Chest x-ray showed multifocal pneumonia.  Managed with IV infusion, remdesivir.  Echocardiogram (02/2020 showed normal systolic function, EF 60-65, impaired relaxation.  Past Medical History:  Diagnosis Date  . Anemia   . Anxiety   . Depression   . Dyspnea    SINCE STROKE  . GERD (gastroesophageal reflux disease)   . Hyperlipidemia   . Hypertension   . IBS (irritable bowel syndrome)   . Migraine   . Previous cesarean delivery, delivered, with  or without mention of antepartum condition 01/19/2012  . Psychogenic nonepileptic seizure    hx/notes 12/19/2016-LAST SEIZURE IN 2019-ON NO MEDS AS OF 05-24-19  . Restless leg syndrome   . Sleep apnea    USES CPAP  . Stroke (HCC) 12/18/2016   Acute arterial ischemic stroke, multifocal, posterior circulation /notes 12/19/2016-LEFT SIDED WEAKNESS, MEMORY FATIGUE AND TROUBLE FINDING HER WORDS OCC    Past Surgical History:  Procedure Laterality Date  . ABDOMINOPLASTY  Feb. 2015  . CESAREAN SECTION  2011  . CHOLECYSTECTOMY N/A 05/31/2019   Procedure: LAPAROSCOPIC CHOLECYSTECTOMY WITH INTRAOPERATIVE CHOLANGIOGRAM;  Surgeon: Byrnett, Jeffrey W, MD;  Location: [facility];  Service: General;  Laterality: N/A;  . COLONOSCOPY WITH PROPOFOL N/A 11/26/2016   Procedure: COLONOSCOPY WITH PROPOFOL;  Surgeon: Anna, Kiran, MD;  Location: [facility];  Service: Gastroenterology;  Laterality: N/A;  . CYSTOSCOPY  02/02/2015   Procedure: CYSTOSCOPY;  Surgeon: Robert P Harris, MD;  Location: [facility];  Service: Gynecology;;  . DILATION AND CURETTAGE OF UTERUS  2003, 2005, 2008  . ESOPHAGOGASTRODUODENOSCOPY N/A 05/31/2019   Procedure: ESOPHAGOGASTRODUODENOSCOPY (EGD);  Surgeon: Byrnett, Jeffrey W, MD;  Location: [facility];  Service: General;  Laterality: N/A;  in the O.R.  . ESOPHAGOGASTRODUODENOSCOPY (EGD) WITH PROPOFOL N/A 11/26/2016   Procedure: ESOPHAGOGASTRODUODENOSCOPY (EGD) WITH PROPOFOL;  Surgeon: Anna, Kiran, MD;  Location: [facility];  Service: Gastroenterology;  Laterality: N/A;  . HERNIA REPAIR  1999   Umbilical  . KNEE   ARTHROSCOPY Right 06-27-04  . LAPAROSCOPIC BILATERAL SALPINGECTOMY Bilateral 02/02/2015   Procedure: LAPAROSCOPIC BILATERAL SALPINGECTOMY;  Surgeon: Nadara Mustard, MD;  Location: [facility];  Service: Gynecology;  Laterality: Bilateral;  . LAPAROSCOPIC HYSTERECTOMY N/A 02/02/2015   Procedure: HYSTERECTOMY TOTAL LAPAROSCOPIC;  Surgeon: Nadara Mustard, MD;  Location: [facility];  Service:  Gynecology;  Laterality: N/A;  . LOOP RECORDER INSERTION N/A 12/20/2016   Procedure: LOOP RECORDER INSERTION;  Surgeon: Regan Lemming, MD;  [facility];  Service: Cardiovascular;  Laterality: N/A;  . REDUCTION MAMMAPLASTY Bilateral December 2015  . TEE WITHOUT CARDIOVERSION N/A 12/20/2016   Procedure: TRANSESOPHAGEAL ECHOCARDIOGRAM (TEE);  Surgeon: Lars Masson, MD;  Location: [facility];  Service: Cardiovascular;  Laterality: N/A;    Current Medications: Current Meds  Medication Sig  . albuterol (VENTOLIN HFA) 108 (90 Base) MCG/ACT inhaler Inhale 2 puffs into the lungs every 6 (six) hours as needed for wheezing or shortness of breath.  . ALPRAZolam (XANAX XR) 1 MG 24 hr tablet Take 1 tablet (1 mg total) by mouth every morning.  Marland Kitchen ascorbic acid (VITAMIN C) 500 MG tablet Take 1 tablet (500 mg total) by mouth daily.  Marland Kitchen aspirin EC 81 MG tablet Take 81 mg by mouth daily.  . cyclobenzaprine (FLEXERIL) 5 MG tablet Take 1 tablet (5 mg total) by mouth every evening.  Marland Kitchen icosapent Ethyl (VASCEPA) 1 g capsule TAKE 1 CAPSULE BY MOUTH EVERY MORNING, AT NOON, AND AT BEDTIME  . ivabradine (CORLANOR) 5 MG TABS tablet Take 1 tablet (5 mg total) by mouth 2 (two) times daily with a meal.  . lamoTRIgine (LAMICTAL) 25 MG tablet Take 25 mg by mouth 2 (two) times daily.  . pantoprazole (PROTONIX) 40 MG tablet Take 1 tablet (40 mg total) by mouth every morning.  Marland Kitchen PARoxetine (PAXIL) 20 MG tablet Take 3 tablets (60 mg total) by mouth daily.  . rosuvastatin (CRESTOR) 40 MG tablet Take 1 tablet (40 mg total) by mouth daily.  Marland Kitchen UBRELVY 100 MG TABS Take 100 mg by mouth daily as needed (migraine).   . Vitamin D, Ergocalciferol, (DRISDOL) 1.25 MG (50000 UNIT) CAPS capsule Take 1 capsule (50,000 Units total) by mouth every 7 (seven) days.  Marland Kitchen zinc sulfate 220 (50 Zn) MG capsule Take 1 capsule (220 mg total) by mouth daily.  Marland Kitchen zolpidem (AMBIEN CR) 12.5 MG CR tablet Take 1 tablet (12.5 mg total)  by mouth at bedtime as needed for sleep.     Allergies:   Azithromycin and Penicillins   Social History   Socioeconomic History  . Marital status: Married    Spouse name: Jonny Ruiz   . Number of children: 4  . Years of education: Not on file  . Highest education level: Not on file  Occupational History  . Occupation: Agricultural engineer: [employer]  Tobacco Use  . Smoking status: Never Smoker  . Smokeless tobacco: Never Used  Vaping Use  . Vaping Use: Never used  Substance and Sexual Activity  . Alcohol use: No    Alcohol/week: 0.0 standard drinks  . Drug use: No  . Sexual activity: Yes    Partners: Male  Other Topics Concern  . Not on file  Social History Narrative   First husband died suddenly in 28-Jun-2014   Remarried 06/07/2016   Social Determinants of Health   Financial Resource Strain: Not on file  Food Insecurity: Not on file  Transportation Needs: Not on file  Physical Activity: Not  on file  Stress: Not on file  Social Connections: Not on file     Family History: The patient's family history includes Heart Problems in her father; Hyperlipidemia in her mother; Hypertension in her mother; Prostate cancer in her maternal grandfather; Von Willebrand disease in her maternal uncle.  ROS:   Please see the history of present illness.     All other systems reviewed and are negative.  EKGs/Labs/Other Studies Reviewed:    The following studies were reviewed today:   EKG:  EKG is  ordered today.  The ekg ordered today demonstrates sinus tachycardia, heart rate 118  Recent Labs: 05/31/2019: TSH 0.561 02/23/2020: B Natriuretic Peptide 19.3 02/24/2020: ALT 46; BUN 14; Creatinine, Ser 0.84; Hemoglobin 11.0; Magnesium 2.2; Magnesium 2.2; Platelets 332; Potassium 4.3; Sodium 139  Recent Lipid Panel    Component Value Date/Time   CHOL 193 05/04/2019 0929   CHOL 161 09/12/2017 0838   TRIG 305 (H) 05/04/2019 0929   HDL 34 (L) 05/04/2019 0929   HDL 43 09/12/2017 0838    CHOLHDL 5.7 (H) 05/04/2019 0929   VLDL 76 (H) 12/19/2016 0009   LDLCALC 118 (H) 05/04/2019 0929     Risk Assessment/Calculations:      Physical Exam:    VS:  BP 122/82 (BP Location: Right Arm, Patient Position: Sitting, Cuff Size: Normal)   Pulse (!) 118   Ht 5\' 8"  (1.727 m)   Wt 190 lb 8 oz (86.4 kg)   LMP 01/15/2015 (Exact Date)   SpO2 98%   BMI 28.97 kg/m     Wt Readings from Last 3 Encounters:  03/16/20 190 lb 8 oz (86.4 kg)  03/02/20 187 lb (84.8 kg)  02/23/20 200 lb (90.7 kg)     GEN:  Well nourished, well developed in no acute distress HEENT: Normal NECK: No JVD; No carotid bruits LYMPHATICS: No lymphadenopathy CARDIAC: Tachycardic, no murmurs, rubs, gallops RESPIRATORY:  Clear to auscultation without rales, wheezing or rhonchi  ABDOMEN: Soft, non-tender, non-distended MUSCULOSKELETAL:  No edema; No deformity  SKIN: Warm and dry NEUROLOGIC:  Alert and oriented x 3 PSYCHIATRIC:  Normal affect   ASSESSMENT:    1. Cerebrovascular accident (CVA), unspecified mechanism (HCC)   2. Inappropriate sinus tachycardia   3. Orthostatic hypotension    PLAN:    In order of problems listed above:  1. Patient with multiple strokes, TIA in the past.  Has an implanted loop recorder, last check in 2021 with no evidence of A. fib or flutter.  We will plan to do a TEE to evaluate PFO, atrial septal aneurysm.  Continue aspirin and Crestor as prescribed per neurology. 2. Inappropriate sinus tach, she failed management with beta-blockers due to worsening orthostasis.  Start ivabradine 5 mg twice daily. 3. History of orthostasis, orthostatic vitals today where trending towards orthostasis with a 16 point drop in systolic BPs, and heart rates increasing from 114-139.  Patient was significantly symptomatic with dizziness, loss of balance, lightheaded.  Plan to start midodrine at follow-up visit.  Hydration advised.  Follow-up in  6 weeks  Shared Decision Making/Informed Consent The  risks [esophageal damage, perforation (1:10,000 risk), bleeding, pharyngeal hematoma as well as other potential complications associated with conscious sedation including aspiration, arrhythmia, respiratory failure and death], benefits (treatment guidance and diagnostic support) and alternatives of a transesophageal echocardiogram were discussed in detail with Ms. Montoya and she is willing to proceed.     Medication Adjustments/Labs and Tests Ordered: Current medicines are reviewed at length with the  patient today.  Concerns regarding medicines are outlined above.  Orders Placed This Encounter  Procedures  . Basic metabolic panel  . CBC  . EKG 12-Lead   Meds ordered this encounter  Medications  . ivabradine (CORLANOR) 5 MG TABS tablet    Sig: Take 1 tablet (5 mg total) by mouth 2 (two) times daily with a meal.    Dispense:  60 tablet    Refill:  5    Patient Instructions  Medication Instructions:   Your physician has recommended you make the following change in your medication:   START taking Corlanor (Ivabradine) 5 MG: Take 1 tablet by mouth two times daily.  *If you need a refill on your cardiac medications before your next appointment, please call your pharmacy*   Lab Work:  BMP, CBC drawn today.    Testing/Procedures:  You are scheduled for a TEE/Cardioversion/TEE Cardioversion on Wednesday 03/29/20 with Dr. Azucena Cecil.  Please arrive at the [facility] at 6:30 AM. DIET: Nothing to eat or drink after midnight except a sip of water with medications (see medication instructions below)  You must have a responsible person to drive you home and stay in the waiting area during your procedure. Failure to do so could result in cancellation.  Bring your insurance cards.  *Special Note: Every effort is made to have your procedure done on time. Occasionally there are emergencies that occur at the hospital that may cause delays. Please be patient if a delay does occur.      Follow-Up: At [facility], you and your health needs are our priority.  As part of our continuing mission to provide you with exceptional heart care, we have created designated Provider Care Teams.  These Care Teams include your primary Cardiologist (physician) and Advanced Practice Providers (APPs -  Physician Assistants and Nurse Practitioners) who all work together to provide you with the care you need, when you need it.  We recommend signing up for the patient portal called "MyChart".  Sign up information is provided on this After Visit Summary.  MyChart is used to connect with patients for Virtual Visits (Telemedicine).  Patients are able to view lab/test results, encounter notes, upcoming appointments, etc.  Non-urgent messages can be sent to your provider as well.   To learn more about what you can do with MyChart, go to [email or web address].    Your next appointment:   6 week(s)  The format for your next appointment:   In Person  Provider:   Debbe Odea, MD   Other Instructions  Samples of this drug (Corlanor 5 MG) were given to the patient, quantity 2 bottles, Lot Number [id number] exp: 7/25    Signed, Debbe Odea, MD  03/16/2020 12:38 PM    [facility]

## 2020-03-16 NOTE — Telephone Encounter (Signed)
Pt requiring PA for Corlanor 5 mg tablet bid. PA has been submitted Corlanor 5 mg tablet.  Awaiting for approval notification:  Your information has been submitted to [facility]. [employer] [place] will review the request and notify you of the determination decision directly, typically within 72 hours of receiving all information.  You will also receive your request decision electronically. To check for an update later, open this request again from your dashboard.  If [employer] has not responded within the specified timeframe or if you have any questions about your PA submission, contact [employer] [place] directly at [phone].

## 2020-03-16 NOTE — H&P (View-Only) (Signed)
Cardiology Office Alicia:    Date:  03/16/2020   ID:  Alicia [name] Alicia [name], Alicia [name], MRN [id number]  PCP:  Alba Cory, MD  [facility] Cardiologist:  Debbe Odea, MD  [facility] Electrophysiologist:  None   Referring MD: Alba Cory, MD   Chief Complaint  Patient presents with  . Other    C/o sob and hypertension. Meds reviewed verbally with pt.   Alicia [name] Alicia Alicia [name] is a 50 y.o. female who is being seen today for the evaluation of strokes and hypertension at the request of Alba Cory, MD.   History of Present Illness:    Alicia [name] Alicia [name] is a 50 y.o. female with a hx of anxiety, hyperlipidemia, CVA, TIA who presents due to strokelike BP issues.  Patient first diagnosed with strokes in 2018.  At that time, she had dizziness, nausea, left-sided weakness.  Also has a history of seizures.  Had another episode of CVA in 2019 with left-sided arm and leg weakness.  Loop recorder was placed in 2018, last checked a month ago without any evidence of A. fib or flutter.  Also states having orthostasis for the past couple of years, and tachycardia.  Was placed on Coreg to help with heart rate control, but this made her orthostasis worse.  Coreg was stopped, her blood pressures improved, but she still gets dizzy when standing for long.  Sometimes, associated with visual issues, and syncope.  Patient was diagnosed with COVID-19 pneumonia on 01/2020 after presenting to the hospital with shortness of breath.  Chest x-ray showed multifocal pneumonia.  Managed with IV infusion, remdesivir.  Echocardiogram (02/2020 showed normal systolic function, EF 60-65, impaired relaxation.  Past Medical History:  Diagnosis Date  . Anemia   . Anxiety   . Depression   . Dyspnea    SINCE STROKE  . GERD (gastroesophageal reflux disease)   . Hyperlipidemia   . Hypertension   . IBS (irritable bowel syndrome)   . Migraine   . Previous cesarean delivery, delivered, with  or without mention of antepartum condition 01/19/2012  . Psychogenic nonepileptic seizure    hx/notes 12/19/2016-LAST SEIZURE IN 2019-ON NO MEDS AS OF 05-24-19  . Restless leg syndrome   . Sleep apnea    USES CPAP  . Stroke (HCC) 12/18/2016   Acute arterial ischemic stroke, multifocal, posterior circulation /notes 12/19/2016-LEFT SIDED WEAKNESS, MEMORY FATIGUE AND TROUBLE FINDING HER WORDS [facility]    Past Surgical History:  Procedure Laterality Date  . ABDOMINOPLASTY  Feb. 2015  . CESAREAN SECTION  2011  . CHOLECYSTECTOMY N/A 05/31/2019   Procedure: LAPAROSCOPIC CHOLECYSTECTOMY WITH INTRAOPERATIVE CHOLANGIOGRAM;  Surgeon: Earline Mayotte, MD;  Location: [facility] ORS;  Service: General;  Laterality: N/A;  . COLONOSCOPY WITH PROPOFOL N/A 11/26/2016   Procedure: COLONOSCOPY WITH PROPOFOL;  Surgeon: Wyline Mood, MD;  Location: [facility] ENDOSCOPY;  Service: Gastroenterology;  Laterality: N/A;  . CYSTOSCOPY  02/02/2015   Procedure: CYSTOSCOPY;  Surgeon: Nadara Mustard, MD;  Location: [facility] ORS;  Service: Gynecology;;  . Joya Gaskins AND CURETTAGE OF UTERUS  2003, 2005, 2008  . ESOPHAGOGASTRODUODENOSCOPY N/A 05/31/2019   Procedure: ESOPHAGOGASTRODUODENOSCOPY (EGD);  Surgeon: Earline Mayotte, MD;  Location: [facility] ORS;  Service: General;  Laterality: N/A;  in the O.R.  . ESOPHAGOGASTRODUODENOSCOPY (EGD) WITH PROPOFOL N/A 11/26/2016   Procedure: ESOPHAGOGASTRODUODENOSCOPY (EGD) WITH PROPOFOL;  Surgeon: Wyline Mood, MD;  Location: [facility] ENDOSCOPY;  Service: Gastroenterology;  Laterality: N/A;  . HERNIA REPAIR  1999   Umbilical  . KNEE  ARTHROSCOPY Right 06-27-04  . LAPAROSCOPIC BILATERAL SALPINGECTOMY Bilateral 02/02/2015   Procedure: LAPAROSCOPIC BILATERAL SALPINGECTOMY;  Surgeon: Nadara Mustard, MD;  Location: [facility] ORS;  Service: Gynecology;  Laterality: Bilateral;  . LAPAROSCOPIC HYSTERECTOMY N/A 02/02/2015   Procedure: HYSTERECTOMY TOTAL LAPAROSCOPIC;  Surgeon: Nadara Mustard, MD;  Location: [facility] ORS;  Service:  Gynecology;  Laterality: N/A;  . LOOP RECORDER INSERTION N/A 12/20/2016   Procedure: LOOP RECORDER INSERTION;  Surgeon: Regan Lemming, MD;  Location: [facility] INVASIVE CV LAB;  Service: Cardiovascular;  Laterality: N/A;  . REDUCTION MAMMAPLASTY Bilateral December 2015  . TEE WITHOUT CARDIOVERSION N/A 12/20/2016   Procedure: TRANSESOPHAGEAL ECHOCARDIOGRAM (TEE);  Surgeon: Lars Masson, MD;  Location: [facility] ENDOSCOPY;  Service: Cardiovascular;  Laterality: N/A;    Current Medications: Current Meds  Medication Sig  . albuterol (VENTOLIN HFA) 108 (90 Base) MCG/ACT inhaler Inhale 2 puffs into the lungs every 6 (six) hours as needed for wheezing or shortness of breath.  . ALPRAZolam (XANAX XR) 1 MG 24 hr tablet Take 1 tablet (1 mg total) by mouth every morning.  Marland Kitchen ascorbic acid (VITAMIN C) 500 MG tablet Take 1 tablet (500 mg total) by mouth daily.  Marland Kitchen aspirin EC 81 MG tablet Take 81 mg by mouth daily.  . cyclobenzaprine (FLEXERIL) 5 MG tablet Take 1 tablet (5 mg total) by mouth every evening.  Marland Kitchen icosapent Ethyl (VASCEPA) 1 g capsule TAKE 1 CAPSULE BY MOUTH EVERY MORNING, AT NOON, AND AT BEDTIME  . ivabradine (CORLANOR) 5 MG TABS tablet Take 1 tablet (5 mg total) by mouth 2 (two) times daily with a meal.  . lamoTRIgine (LAMICTAL) 25 MG tablet Take 25 mg by mouth 2 (two) times daily.  . pantoprazole (PROTONIX) 40 MG tablet Take 1 tablet (40 mg total) by mouth every morning.  Marland Kitchen PARoxetine (PAXIL) 20 MG tablet Take 3 tablets (60 mg total) by mouth daily.  . rosuvastatin (CRESTOR) 40 MG tablet Take 1 tablet (40 mg total) by mouth daily.  Marland Kitchen UBRELVY 100 MG TABS Take 100 mg by mouth daily as needed (migraine).   . Vitamin D, Ergocalciferol, (DRISDOL) 1.25 MG (50000 UNIT) CAPS capsule Take 1 capsule (50,000 Units total) by mouth every 7 (seven) days.  Marland Kitchen zinc sulfate 220 (50 Zn) MG capsule Take 1 capsule (220 mg total) by mouth daily.  Marland Kitchen zolpidem (AMBIEN CR) 12.5 MG CR tablet Take 1 tablet (12.5 mg total)  by mouth at bedtime as needed for sleep.     Allergies:   Azithromycin and Penicillins   Social History   Socioeconomic History  . Marital status: Married    Spouse name: Alicia [name]   . Number of children: 4  . Years of education: Not on file  . Highest education level: Not on file  Occupational History  . Occupation: Agricultural engineer: [employer]  Tobacco Use  . Smoking status: Never Smoker  . Smokeless tobacco: Never Used  Vaping Use  . Vaping Use: Never used  Substance and Sexual Activity  . Alcohol use: No    Alcohol/week: 0.0 standard drinks  . Drug use: No  . Sexual activity: Yes    Partners: Male  Other Topics Concern  . Not on file  Social History Narrative   First husband died suddenly in 28-Jun-2014   Remarried 06/07/2016   Social Determinants of Health   Financial Resource Strain: Not on file  Food Insecurity: Not on file  Transportation Needs: Not on file  Physical Activity: Not  on file  Stress: Not on file  Social Connections: Not on file     Family History: The patient's family history includes Heart Problems in her father; Hyperlipidemia in her mother; Hypertension in her mother; Prostate cancer in her maternal grandfather; Von Willebrand disease in her maternal uncle.  ROS:   Please see the history of present illness.     All other systems reviewed and are negative.  EKGs/Labs/Other Studies Reviewed:    The following studies were reviewed today:   EKG:  EKG is  ordered today.  The ekg ordered today demonstrates sinus tachycardia, heart rate 118  Recent Labs: 05/31/2019: TSH 0.561 02/23/2020: B Natriuretic Peptide 19.3 02/24/2020: ALT 46; BUN 14; Creatinine, Ser 0.84; Hemoglobin 11.0; Magnesium 2.2; Magnesium 2.2; Platelets 332; Potassium 4.3; Sodium 139  Recent Lipid Panel    Component Value Date/Time   CHOL 193 05/04/2019 0929   CHOL 161 09/12/2017 0838   TRIG 305 (H) 05/04/2019 0929   HDL 34 (L) 05/04/2019 0929   HDL 43 09/12/2017 0838    CHOLHDL 5.7 (H) 05/04/2019 0929   VLDL 76 (H) 12/19/2016 0009   LDLCALC 118 (H) 05/04/2019 0929     Risk Assessment/Calculations:      Physical Exam:    VS:  BP 122/82 (BP Location: Right Arm, Patient Position: Sitting, Cuff Size: Normal)   Pulse (!) 118   Ht 5\' 8"  (1.727 m)   Wt 190 lb 8 oz (86.4 kg)   LMP 01/15/2015 (Exact Date)   SpO2 98%   BMI 28.97 kg/m     Wt Readings from Last 3 Encounters:  03/16/20 190 lb 8 oz (86.4 kg)  03/02/20 187 lb (84.8 kg)  02/23/20 200 lb (90.7 kg)     GEN:  Well nourished, well developed in no acute distress HEENT: Normal NECK: No JVD; No carotid bruits LYMPHATICS: No lymphadenopathy CARDIAC: Tachycardic, no murmurs, rubs, gallops RESPIRATORY:  Clear to auscultation without rales, wheezing or rhonchi  ABDOMEN: Soft, non-tender, non-distended MUSCULOSKELETAL:  No edema; No deformity  SKIN: Warm and dry NEUROLOGIC:  Alert and oriented x 3 PSYCHIATRIC:  Normal affect   ASSESSMENT:    1. Cerebrovascular accident (CVA), unspecified mechanism (HCC)   2. Inappropriate sinus tachycardia   3. Orthostatic hypotension    PLAN:    In order of problems listed above:  1. Patient with multiple strokes, TIA in the past.  Has an implanted loop recorder, last check in 2021 with no evidence of A. fib or flutter.  We will plan to do a TEE to evaluate PFO, atrial septal aneurysm.  Continue aspirin and Crestor as prescribed per neurology. 2. Inappropriate sinus tach, she failed management with beta-blockers due to worsening orthostasis.  Start ivabradine 5 mg twice daily. 3. History of orthostasis, orthostatic vitals today where trending towards orthostasis with a 16 point drop in systolic BPs, and heart rates increasing from 114-139.  Patient was significantly symptomatic with dizziness, loss of balance, lightheaded.  Plan to start midodrine at follow-up visit.  Hydration advised.  Follow-up in  6 weeks  Shared Decision Making/Informed Consent The  risks [esophageal damage, perforation (1:10,000 risk), bleeding, pharyngeal hematoma as well as other potential complications associated with conscious sedation including aspiration, arrhythmia, respiratory failure and death], benefits (treatment guidance and diagnostic support) and alternatives of a transesophageal echocardiogram were discussed in detail with Alicia [name] and she is willing to proceed.     Medication Adjustments/Labs and Tests Ordered: Current medicines are reviewed at length with the  patient today.  Concerns regarding medicines are outlined above.  Orders Placed This Encounter  Procedures  . Basic metabolic panel  . CBC  . EKG 12-Lead   Meds ordered this encounter  Medications  . ivabradine (CORLANOR) 5 MG TABS tablet    Sig: Take 1 tablet (5 mg total) by mouth 2 (two) times daily with a meal.    Dispense:  60 tablet    Refill:  5    Patient Instructions  Medication Instructions:   Your physician has recommended you make the following change in your medication:   START taking Corlanor (Ivabradine) 5 MG: Take 1 tablet by mouth two times daily.  *If you need a refill on your cardiac medications before your next appointment, please call your pharmacy*   Lab Work:  BMP, CBC drawn today.    Testing/Procedures:  You are scheduled for a TEE/Cardioversion/TEE Cardioversion on Wednesday 03/29/20 with Dr. Azucena Cecil.  Please arrive at the [facility] Entrance at 6:30 AM. DIET: Nothing to eat or drink after midnight except a sip of water with medications (see medication instructions below)  You must have a responsible person to drive you home and stay in the waiting area during your procedure. Failure to do so could result in cancellation.  Bring your insurance cards.  *Special Alicia: Every effort is made to have your procedure done on time. Occasionally there are emergencies that occur at the hospital that may cause delays. Please be patient if a delay does occur.      Follow-Up: At [facility], you and your health needs are our priority.  As part of our continuing mission to provide you with exceptional heart care, we have created designated Provider Care Teams.  These Care Teams include your primary Cardiologist (physician) and Advanced Practice Providers (APPs -  Physician Assistants and Nurse Practitioners) who all work together to provide you with the care you need, when you need it.  We recommend signing up for the patient portal called "MyChart".  Sign up information is provided on this After Visit Summary.  MyChart is used to connect with patients for Virtual Visits (Telemedicine).  Patients are able to view lab/test results, encounter notes, upcoming appointments, etc.  Non-urgent messages can be sent to your provider as well.   To learn more about what you can do with MyChart, go to [email or web address].    Your next appointment:   6 week(s)  The format for your next appointment:   In Person  Provider:   Debbe Odea, MD   Other Instructions  Samples of this drug (Corlanor 5 MG) were given to the patient, quantity 2 bottles, Lot Number [id number] exp: 7/25    Signed, Debbe Odea, MD  03/16/2020 12:38 PM    [facility]

## 2020-03-16 NOTE — Addendum Note (Signed)
Addended by: Debbe Odea on: 03/16/2020 12:39 PM   Modules accepted: Orders, SmartSet

## 2020-03-16 NOTE — Patient Instructions (Addendum)
Medication Instructions:   Your physician has recommended you make the following change in your medication:   START taking Corlanor (Ivabradine) 5 MG: Take 1 tablet by mouth two times daily.  *If you need a refill on your cardiac medications before your next appointment, please call your pharmacy*   Lab Work:  BMP, CBC drawn today.    Testing/Procedures:  You are scheduled for a TEE/Cardioversion/TEE Cardioversion on Wednesday 03/29/20 with Dr. Azucena Cecil.  Please arrive at the [facility] Entrance at 6:30 AM. DIET: Nothing to eat or drink after midnight except a sip of water with medications (see medication instructions below)  You must have a responsible person to drive you home and stay in the waiting area during your procedure. Failure to do so could result in cancellation.  Bring your insurance cards.  *Special Note: Every effort is made to have your procedure done on time. Occasionally there are emergencies that occur at the hospital that may cause delays. Please be patient if a delay does occur.     Follow-Up: At [facility], you and your health needs are our priority.  As part of our continuing mission to provide you with exceptional heart care, we have created designated Provider Care Teams.  These Care Teams include your primary Cardiologist (physician) and Advanced Practice Providers (APPs -  Physician Assistants and Nurse Practitioners) who all work together to provide you with the care you need, when you need it.  We recommend signing up for the patient portal called "MyChart".  Sign up information is provided on this After Visit Summary.  MyChart is used to connect with patients for Virtual Visits (Telemedicine).  Patients are able to view lab/test results, encounter notes, upcoming appointments, etc.  Non-urgent messages can be sent to your provider as well.   To learn more about what you can do with MyChart, go to [email or web address].    Your next appointment:    6 week(s)  The format for your next appointment:   In Person  Provider:   Debbe Odea, MD   Other Instructions  Samples of this drug (Corlanor 5 MG) were given to the patient, quantity 2 bottles, Lot Number [id number] exp: 7/25

## 2020-03-17 ENCOUNTER — Telehealth: Payer: Self-pay

## 2020-03-17 LAB — BASIC METABOLIC PANEL
BUN/Creatinine Ratio: 12 (ref 9–23)
BUN: 13 mg/dL (ref 6–24)
CO2: 23 mmol/L (ref 20–29)
Calcium: 9.1 mg/dL (ref 8.7–10.2)
Chloride: 100 mmol/L (ref 96–106)
Creatinine, Ser: 1.09 mg/dL — ABNORMAL HIGH (ref 0.57–1.00)
GFR calc Af Amer: 69 mL/min/{1.73_m2} (ref >59)
GFR calc non Af Amer: 60 mL/min/{1.73_m2} (ref >59)
Glucose: 105 mg/dL — ABNORMAL HIGH (ref 65–99)
Potassium: 4 mmol/L (ref 3.5–5.2)
Sodium: 138 mmol/L (ref 134–144)

## 2020-03-17 LAB — CBC
Hematocrit: 36.3 % (ref 34.0–46.6)
Hemoglobin: 12.2 g/dL (ref 11.1–15.9)
MCH: 30.5 pg (ref 26.6–33.0)
MCHC: 33.6 g/dL (ref 31.5–35.7)
MCV: 91 fL (ref 79–97)
Platelets: 254 10*3/uL (ref 150–450)
RBC: 4 x10E6/uL (ref 3.77–5.28)
RDW: 14.9 % (ref 11.7–15.4)
WBC: 6 10*3/uL (ref 3.4–10.8)

## 2020-03-17 NOTE — Telephone Encounter (Signed)
Called patient and informed her that her check in time for her TEE was moved to 0800 on 03/29/20 and reminded her to get her Covid screening on Monday 03/27/20. Patient verbalized understanding and agreed with plan.

## 2020-03-18 LAB — CUP PACEART REMOTE DEVICE CHECK
Date Time Interrogation Session: 20220126234218
Implantable Pulse Generator Implant Date: 20181103

## 2020-03-20 ENCOUNTER — Ambulatory Visit (INDEPENDENT_AMBULATORY_CARE_PROVIDER_SITE_OTHER): Payer: BC Managed Care – PPO

## 2020-03-20 DIAGNOSIS — I639 Cerebral infarction, unspecified: Secondary | ICD-10-CM | POA: Diagnosis not present

## 2020-03-20 DIAGNOSIS — J9601 Acute respiratory failure with hypoxia: Secondary | ICD-10-CM | POA: Diagnosis not present

## 2020-03-20 DIAGNOSIS — J1282 Pneumonia due to coronavirus disease 2019: Secondary | ICD-10-CM | POA: Diagnosis not present

## 2020-03-20 DIAGNOSIS — U071 COVID-19: Secondary | ICD-10-CM | POA: Diagnosis not present
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
## 2020-03-21 ENCOUNTER — Ambulatory Visit
Admission: RE | Admit: 2020-03-21 | Discharge: 2020-03-21 | Disposition: A | Discharge status: Home / Self Care | Payer: BC Managed Care – PPO | Source: Ambulatory Visit | Attending: Family Medicine | Admitting: Family Medicine

## 2020-03-21 ENCOUNTER — Other Ambulatory Visit: Payer: Self-pay

## 2020-03-21 ENCOUNTER — Ambulatory Visit
Admission: RE | Admit: 2020-03-21 | Discharge: 2020-03-21 | Disposition: A | Discharge status: Home / Self Care | Payer: BC Managed Care – PPO | Attending: Family Medicine | Admitting: Family Medicine

## 2020-03-21 ENCOUNTER — Encounter: Payer: Self-pay | Admitting: Family Medicine

## 2020-03-21 ENCOUNTER — Ambulatory Visit: Payer: BC Managed Care – PPO | Admitting: Family Medicine

## 2020-03-21 VITALS — BP 112/76 | HR 99 | Temp 97.6°F | Resp 16 | Ht 68.0 in | Wt 192.7 lb

## 2020-03-21 DIAGNOSIS — F411 Generalized anxiety disorder: Secondary | ICD-10-CM

## 2020-03-21 DIAGNOSIS — G43009 Migraine without aura, not intractable, without status migrainosus: Secondary | ICD-10-CM | POA: Diagnosis not present

## 2020-03-21 DIAGNOSIS — Z8701 Personal history of pneumonia (recurrent): Secondary | ICD-10-CM

## 2020-03-21 DIAGNOSIS — M310 Hypersensitivity angiitis: Secondary | ICD-10-CM

## 2020-03-21 DIAGNOSIS — G4733 Obstructive sleep apnea (adult) (pediatric): Secondary | ICD-10-CM

## 2020-03-21 DIAGNOSIS — R569 Unspecified convulsions: Secondary | ICD-10-CM

## 2020-03-21 DIAGNOSIS — F330 Major depressive disorder, recurrent, mild: Secondary | ICD-10-CM | POA: Diagnosis not present

## 2020-03-21 DIAGNOSIS — J189 Pneumonia, unspecified organism: Secondary | ICD-10-CM | POA: Diagnosis not present

## 2020-03-21 DIAGNOSIS — R059 Cough, unspecified: Secondary | ICD-10-CM | POA: Diagnosis not present

## 2020-03-21 DIAGNOSIS — G894 Chronic pain syndrome: Secondary | ICD-10-CM | POA: Diagnosis not present

## 2020-03-21 DIAGNOSIS — E559 Vitamin D deficiency, unspecified: Secondary | ICD-10-CM

## 2020-03-21 DIAGNOSIS — E229 Hyperfunction of pituitary gland, unspecified: Secondary | ICD-10-CM

## 2020-03-21 DIAGNOSIS — J683 Other acute and subacute respiratory conditions due to chemicals, gases, fumes and vapors: Secondary | ICD-10-CM

## 2020-03-21 DIAGNOSIS — E785 Hyperlipidemia, unspecified: Secondary | ICD-10-CM

## 2020-03-21 DIAGNOSIS — F5104 Psychophysiologic insomnia: Secondary | ICD-10-CM

## 2020-03-21 DIAGNOSIS — R5382 Chronic fatigue, unspecified: Secondary | ICD-10-CM

## 2020-03-21 DIAGNOSIS — G9332 Myalgic encephalomyelitis/chronic fatigue syndrome: Secondary | ICD-10-CM | POA: Insufficient documentation

## 2020-03-21 DIAGNOSIS — U099 Post covid-19 condition, unspecified: Secondary | ICD-10-CM

## 2020-03-21 DIAGNOSIS — I69354 Hemiplegia and hemiparesis following cerebral infarction affecting left non-dominant side: Secondary | ICD-10-CM

## 2020-03-21 MED ORDER — ZOLPIDEM TARTRATE ER 12.5 MG PO TBCR: 12.5000 mg | EXTENDED_RELEASE_TABLET | Freq: Every evening | ORAL | 0 refills | Status: DC | PRN

## 2020-03-21 MED ORDER — ATORVASTATIN CALCIUM 80 MG PO TABS: 80.0000 mg | ORAL_TABLET | Freq: Every day | ORAL | 1 refills | Status: DC

## 2020-03-21 MED ORDER — ALPRAZOLAM ER 1 MG PO TB24: 1.0000 mg | ORAL_TABLET | ORAL | 0 refills | Status: DC

## 2020-03-21 NOTE — Addendum Note (Signed)
Addended by: Ruel Favors on: 03/21/2020 08:44 AM   Modules accepted: Orders

## 2020-03-23 ENCOUNTER — Telehealth: Payer: Self-pay

## 2020-03-23 MED ORDER — DIGOXIN 125 MCG PO TABS: 0.1250 mg | ORAL_TABLET | Freq: Every day | ORAL | 5 refills | Status: DC

## 2020-03-23 NOTE — Telephone Encounter (Signed)
Spoke to [place] with [employer] regarding the denial letter for [employer]. She clarified with their pharmacist and also faxed me all criteria for patient approval and patient does not meet it. The pharmacist stated that the patient must have an accompanying diagnosis of chronic CHF.   I spoke with Dr. Azucena Cecil and he recommended that the patient try Digoxin 0.125 MG once daily. I called the patient and informed her of these changes. She verbalized understanding and agreed with plan.

## 2020-03-27 ENCOUNTER — Other Ambulatory Visit
Admission: RE | Admit: 2020-03-27 | Discharge: 2020-03-27 | Disposition: A | Discharge status: Home / Self Care | Payer: BC Managed Care – PPO | Source: Ambulatory Visit | Attending: Cardiology | Admitting: Cardiology

## 2020-03-27 ENCOUNTER — Other Ambulatory Visit: Payer: Self-pay

## 2020-03-27 DIAGNOSIS — Z7951 Long term (current) use of inhaled steroids: Secondary | ICD-10-CM | POA: Diagnosis not present

## 2020-03-27 DIAGNOSIS — I10 Essential (primary) hypertension: Secondary | ICD-10-CM | POA: Diagnosis not present

## 2020-03-27 DIAGNOSIS — Z20822 Contact with and (suspected) exposure to covid-19: Secondary | ICD-10-CM | POA: Insufficient documentation

## 2020-03-27 DIAGNOSIS — Z880 Allergy status to penicillin: Secondary | ICD-10-CM | POA: Diagnosis not present

## 2020-03-27 DIAGNOSIS — Z01812 Encounter for preprocedural laboratory examination: Secondary | ICD-10-CM | POA: Insufficient documentation

## 2020-03-27 DIAGNOSIS — Z8489 Family history of other specified conditions: Secondary | ICD-10-CM | POA: Diagnosis not present

## 2020-03-27 DIAGNOSIS — Z7982 Long term (current) use of aspirin: Secondary | ICD-10-CM | POA: Diagnosis not present

## 2020-03-27 DIAGNOSIS — Z8349 Family history of other endocrine, nutritional and metabolic diseases: Secondary | ICD-10-CM | POA: Diagnosis not present

## 2020-03-27 DIAGNOSIS — I951 Orthostatic hypotension: Secondary | ICD-10-CM | POA: Diagnosis not present

## 2020-03-27 DIAGNOSIS — I69354 Hemiplegia and hemiparesis following cerebral infarction affecting left non-dominant side: Secondary | ICD-10-CM | POA: Diagnosis not present

## 2020-03-27 DIAGNOSIS — Z8249 Family history of ischemic heart disease and other diseases of the circulatory system: Secondary | ICD-10-CM | POA: Diagnosis not present

## 2020-03-27 DIAGNOSIS — Z8616 Personal history of COVID-19: Secondary | ICD-10-CM | POA: Diagnosis not present

## 2020-03-27 DIAGNOSIS — Z79899 Other long term (current) drug therapy: Secondary | ICD-10-CM | POA: Diagnosis not present

## 2020-03-27 DIAGNOSIS — Z8042 Family history of malignant neoplasm of prostate: Secondary | ICD-10-CM | POA: Diagnosis not present

## 2020-03-27 DIAGNOSIS — Z881 Allergy status to other antibiotic agents status: Secondary | ICD-10-CM | POA: Diagnosis not present

## 2020-03-27 LAB — SARS CORONAVIRUS 2 (TAT 6-24 HRS): SARS Coronavirus 2: NEGATIVE

## 2020-03-28 ENCOUNTER — Encounter: Payer: Self-pay | Admitting: Family Medicine

## 2020-03-29 ENCOUNTER — Encounter: Payer: Self-pay | Admitting: Cardiology

## 2020-03-29 ENCOUNTER — Other Ambulatory Visit: Payer: Self-pay

## 2020-03-29 ENCOUNTER — Ambulatory Visit (HOSPITAL_BASED_OUTPATIENT_CLINIC_OR_DEPARTMENT_OTHER)
Admission: RE | Admit: 2020-03-29 | Discharge: 2020-03-29 | Disposition: A | Discharge status: Home / Self Care | Payer: BC Managed Care – PPO | Source: Home / Self Care | Attending: Cardiology | Admitting: Cardiology

## 2020-03-29 ENCOUNTER — Ambulatory Visit
Admission: RE | Admit: 2020-03-29 | Discharge: 2020-03-29 | Disposition: A | Discharge status: Home / Self Care | Payer: BC Managed Care – PPO | Attending: Cardiology | Admitting: Cardiology

## 2020-03-29 ENCOUNTER — Encounter
Admission: RE | Disposition: A | Discharge status: Home / Self Care | Payer: Self-pay | Source: Home / Self Care | Attending: Cardiology

## 2020-03-29 DIAGNOSIS — Z79899 Other long term (current) drug therapy: Secondary | ICD-10-CM | POA: Insufficient documentation

## 2020-03-29 DIAGNOSIS — Z880 Allergy status to penicillin: Secondary | ICD-10-CM | POA: Diagnosis not present

## 2020-03-29 DIAGNOSIS — Z8489 Family history of other specified conditions: Secondary | ICD-10-CM | POA: Insufficient documentation

## 2020-03-29 DIAGNOSIS — Z7982 Long term (current) use of aspirin: Secondary | ICD-10-CM | POA: Insufficient documentation

## 2020-03-29 DIAGNOSIS — I6389 Other cerebral infarction: Secondary | ICD-10-CM | POA: Diagnosis not present

## 2020-03-29 DIAGNOSIS — Z20822 Contact with and (suspected) exposure to covid-19: Secondary | ICD-10-CM | POA: Insufficient documentation

## 2020-03-29 DIAGNOSIS — Z8349 Family history of other endocrine, nutritional and metabolic diseases: Secondary | ICD-10-CM | POA: Insufficient documentation

## 2020-03-29 DIAGNOSIS — Z8042 Family history of malignant neoplasm of prostate: Secondary | ICD-10-CM | POA: Insufficient documentation

## 2020-03-29 DIAGNOSIS — Z8249 Family history of ischemic heart disease and other diseases of the circulatory system: Secondary | ICD-10-CM | POA: Insufficient documentation

## 2020-03-29 DIAGNOSIS — I951 Orthostatic hypotension: Secondary | ICD-10-CM | POA: Diagnosis not present

## 2020-03-29 DIAGNOSIS — Z7951 Long term (current) use of inhaled steroids: Secondary | ICD-10-CM | POA: Insufficient documentation

## 2020-03-29 DIAGNOSIS — Z881 Allergy status to other antibiotic agents status: Secondary | ICD-10-CM | POA: Insufficient documentation

## 2020-03-29 DIAGNOSIS — I639 Cerebral infarction, unspecified: Secondary | ICD-10-CM

## 2020-03-29 DIAGNOSIS — Z8616 Personal history of COVID-19: Secondary | ICD-10-CM | POA: Insufficient documentation

## 2020-03-29 DIAGNOSIS — I69354 Hemiplegia and hemiparesis following cerebral infarction affecting left non-dominant side: Secondary | ICD-10-CM | POA: Diagnosis not present

## 2020-03-29 DIAGNOSIS — I10 Essential (primary) hypertension: Secondary | ICD-10-CM | POA: Insufficient documentation

## 2020-03-29 HISTORY — PX: TEE WITHOUT CARDIOVERSION: SHX5443

## 2020-03-29 SURGERY — ECHOCARDIOGRAM, TRANSESOPHAGEAL
Anesthesia: Moderate Sedation

## 2020-03-29 MED ORDER — LIDOCAINE VISCOUS HCL 2 % MT SOLN
OROMUCOSAL | Status: AC | PRN
  Administered 2020-03-29: 15 mL via OROMUCOSAL

## 2020-03-29 MED ORDER — SODIUM CHLORIDE 0.9 % IV SOLN: INTRAVENOUS | Status: DC

## 2020-03-29 MED ORDER — FENTANYL CITRATE (PF) 100 MCG/2ML IJ SOLN
INTRAMUSCULAR | Status: AC | PRN
  Administered 2020-03-29 (×2): 25 ug via INTRAVENOUS

## 2020-03-29 MED ORDER — MIDAZOLAM HCL 5 MG/5ML IJ SOLN
INTRAMUSCULAR | Status: AC
  Filled 2020-03-29: qty 5

## 2020-03-29 MED ORDER — MIDAZOLAM HCL 5 MG/5ML IJ SOLN
INTRAMUSCULAR | Status: AC | PRN
  Administered 2020-03-29 (×2): 1 mg via INTRAVENOUS

## 2020-03-29 MED ORDER — BUTAMBEN-TETRACAINE-BENZOCAINE 2-2-14 % EX AERO
INHALATION_SPRAY | CUTANEOUS | Status: AC
  Filled 2020-03-29: qty 5

## 2020-03-29 MED ORDER — LIDOCAINE VISCOUS HCL 2 % MT SOLN
OROMUCOSAL | Status: AC
  Filled 2020-03-29: qty 15

## 2020-03-29 MED ORDER — FENTANYL CITRATE (PF) 100 MCG/2ML IJ SOLN
INTRAMUSCULAR | Status: AC
  Filled 2020-03-29: qty 2

## 2020-03-29 MED ORDER — SODIUM CHLORIDE FLUSH 0.9 % IV SOLN
INTRAVENOUS | Status: AC
  Filled 2020-03-29: qty 10

## 2020-03-29 NOTE — Progress Notes (Signed)
*  PRELIMINARY RESULTS* Echocardiogram Echocardiogram Transesophageal has been performed.  Cristela Blue 03/29/2020, 10:29 AM

## 2020-03-29 NOTE — Progress Notes (Signed)
[facility] Summary Report / Loop Recorder 

## 2020-03-29 NOTE — Procedures (Addendum)
Transesophageal Echocardiogram :  Indication: strokes, CVA  Procedure: 10cc of viscous lidocaine were given orally to provide local anesthesia to the oropharynx. The patient was positioned supine on the left side, bite block provided. The patient was moderately sedated with the doses of versed and fentanyl as detailed below.  Using digital technique an omniplane probe was advanced into the esophagus without incident.   Moderate sedation: 1. Sedation used:  Versed: 2mg , Fentanyl: 2. Time administered:   9:18  Time when patient started recovery: 9:35 3. I was face to face during this time: 17 mins  See report in EPIC  for complete details: In brief, imaging revealed normal LV function with no RWMAs and no mural apical thrombus.  .  Estimated ejection fraction was 60%.  Right sided cardiac chambers were normal with no evidence of pulmonary hypertension.  Imaging of the septum showed no ASD or VSD, Interatrial septum appears aneurysmal. Bubble study was negative for shunt 2D and color flow confirmed no PFO   The LA was well visualized in orthogonal views.  There was no spontaneous contrast and no thrombus in the LA and LA appendage   The descending thoracic aorta had no  mural aortic debris with no evidence of aneurysmal dilation or disection  Recommendations Will refer patient to heart valve/structural clinic overseen by Dr. for atrial septal device consideration due to redundant atrial septum and recurrent strokes.  Patient has had implantable loop recorder which has not shown any arrhythmias such as A. fib or flutter that might be contributing to strokes.   Alicia [name] 03/29/2020 9:39 AM

## 2020-03-29 NOTE — Interval H&P Note (Signed)
History and Physical Interval Note:  03/29/2020 9:36 AM  Alicia [name]  has presented today for surgery, with the diagnosis of stroke, TIA    History TIA, strokes.  The various methods of treatment have been discussed with the patient and family. After consideration of risks, benefits and other options for treatment, the patient has consented to  Procedure(s): TRANSESOPHAGEAL ECHOCARDIOGRAM (TEE) (N/A) as a surgical intervention.  The patient's history has been reviewed, patient examined, no change in status, stable for surgery.  I have reviewed the patient's chart and labs.  Questions were answered to the patient's satisfaction.     Arlys John Agbor-Etang

## 2020-03-30 ENCOUNTER — Telehealth: Payer: Self-pay

## 2020-03-30 ENCOUNTER — Encounter: Payer: Self-pay | Admitting: Cardiology

## 2020-03-30 DIAGNOSIS — Q211 Atrial septal defect: Secondary | ICD-10-CM

## 2020-03-30 DIAGNOSIS — Q2112 Patent foramen ovale: Secondary | ICD-10-CM

## 2020-03-30 NOTE — Telephone Encounter (Signed)
Per Dr. Azucena Cecil, scheduled PFO consult with Dr. Excell Seltzer 05/08/20. The patient was grateful for call and agrees with plan.

## 2020-03-30 NOTE — Telephone Encounter (Signed)
Called patient and informed her that I put a referral in for Dr. Calton Dach with [facility] as requested by Dr. Azucena Cecil post TEE yesterday. I also gave her the phone number to their office.  Patient verbalized understanding and was grateful for the call.

## 2020-04-17 DIAGNOSIS — U071 COVID-19: Secondary | ICD-10-CM | POA: Diagnosis not present

## 2020-04-17 DIAGNOSIS — J9601 Acute respiratory failure with hypoxia: Secondary | ICD-10-CM | POA: Diagnosis not present

## 2020-04-17 DIAGNOSIS — J1282 Pneumonia due to coronavirus disease 2019: Secondary | ICD-10-CM | POA: Diagnosis not present

## 2020-04-24 ENCOUNTER — Ambulatory Visit (INDEPENDENT_AMBULATORY_CARE_PROVIDER_SITE_OTHER): Payer: BC Managed Care – PPO

## 2020-04-24 DIAGNOSIS — I639 Cerebral infarction, unspecified: Secondary | ICD-10-CM | POA: Diagnosis not present

## 2020-04-26 LAB — CUP PACEART REMOTE DEVICE CHECK
Date Time Interrogation Session: 20220228235324
Implantable Pulse Generator Implant Date: 20181103

## 2020-04-27 ENCOUNTER — Other Ambulatory Visit: Payer: Self-pay

## 2020-04-27 ENCOUNTER — Ambulatory Visit (INDEPENDENT_AMBULATORY_CARE_PROVIDER_SITE_OTHER): Payer: BC Managed Care – PPO | Admitting: Cardiology

## 2020-04-27 ENCOUNTER — Encounter: Payer: Self-pay | Admitting: Cardiology

## 2020-04-27 VITALS — BP 102/74 | HR 74 | Ht 68.0 in | Wt 196.0 lb

## 2020-04-27 DIAGNOSIS — R000 Tachycardia, unspecified: Secondary | ICD-10-CM

## 2020-04-27 DIAGNOSIS — I639 Cerebral infarction, unspecified: Secondary | ICD-10-CM | POA: Diagnosis not present

## 2020-04-27 DIAGNOSIS — I951 Orthostatic hypotension: Secondary | ICD-10-CM

## 2020-04-27 DIAGNOSIS — I253 Aneurysm of heart: Secondary | ICD-10-CM | POA: Diagnosis not present

## 2020-04-27 DIAGNOSIS — E782 Mixed hyperlipidemia: Secondary | ICD-10-CM

## 2020-04-27 MED ORDER — MIDODRINE HCL 5 MG PO TABS: 5.0000 mg | ORAL_TABLET | Freq: Three times a day (TID) | ORAL | 3 refills | Status: DC

## 2020-04-27 NOTE — Progress Notes (Signed)
Cardiology Office Note:    Date:  04/27/2020   ID:  [name] Note Alicia [name], Alicia [name] 09/21/1970, MRN [id number]  PCP:  Alba Cory, MD  [facility] Cardiologist:  Debbe Odea, MD  [facility] Electrophysiologist:  None   Referring MD: Alba Cory, MD   Chief Complaint  Patient presents with  . Follow-up    6 Weeks follow up and c/o symptoms has not improved, she is still having trouble breathing when she stand up. Medications verbally reviewed with patient.      History of Present Illness:    Alicia [name] is a 50 y.o. female with a hx of anxiety, hyperlipidemia, CVA, TIA who presents for follow-up.  Previously seen due to history of CVA, orthostasis associated with tachycardia, syncope.    TEE performed to evaluate etiology for strokes.  Atrial septal aneurysm noted, otherwise normal.  Plan to refer to structural heart team for atrial septal device placement consideration.  Ivabradine was attempted for inappropriate sinus tach, but was not covered by insurance.  Beta-blockers previously tried, but patient could not tolerate.  Digoxin was started.  She states symptoms of tachycardia has slightly improved.  She still has dizziness with standing up from seated position.   Prior notes Patient first diagnosed with strokes in 2018.  At that time, she had dizziness, nausea, left-sided weakness.  Also has a history of seizures.  Had another episode of CVA in 2019 with left-sided arm and leg weakness.  Loop recorder was placed in 2018, last checked a month ago without any evidence of A. fib or flutter.  Also states having orthostasis for the past couple of years, and tachycardia.  Was placed on Coreg to help with heart rate control, but this made her orthostasis worse.  Coreg was stopped, her blood pressures improved, but she still gets dizzy when standing for long.  Sometimes, associated with visual issues, and syncope.  Patient was diagnosed with COVID-19 pneumonia  on 01/2020 after presenting to the hospital with shortness of breath.  Chest x-ray showed multifocal pneumonia.  Managed with IV infusion, remdesivir.  Echocardiogram (02/2020 showed normal systolic function, EF 60-65, impaired relaxation.  Past Medical History:  Diagnosis Date  . Anemia   . Anxiety   . Depression   . Dyspnea    SINCE STROKE  . GERD (gastroesophageal reflux disease)   . Hyperlipidemia   . Hypertension   . IBS (irritable bowel syndrome)   . Migraine   . Previous cesarean delivery, delivered, with or without mention of antepartum condition 01/19/2012  . Psychogenic nonepileptic seizure    hx/notes 12/19/2016-LAST SEIZURE IN 2019-ON NO MEDS AS OF 05-24-19  . Restless leg syndrome   . Sleep apnea    USES CPAP  . Stroke (HCC) 12/18/2016   Acute arterial ischemic stroke, multifocal, posterior circulation /notes 12/19/2016-LEFT SIDED WEAKNESS, MEMORY FATIGUE AND TROUBLE FINDING HER WORDS [facility]    Past Surgical History:  Procedure Laterality Date  . ABDOMINOPLASTY  Feb. 2015  . CESAREAN SECTION  2011  . CHOLECYSTECTOMY N/A 05/31/2019   Procedure: LAPAROSCOPIC CHOLECYSTECTOMY WITH INTRAOPERATIVE CHOLANGIOGRAM;  Surgeon: Earline Mayotte, MD;  Location: [facility] ORS;  Service: General;  Laterality: N/A;  . COLONOSCOPY WITH PROPOFOL N/A 11/26/2016   Procedure: COLONOSCOPY WITH PROPOFOL;  Surgeon: Wyline Mood, MD;  Location: [facility] ENDOSCOPY;  Service: Gastroenterology;  Laterality: N/A;  . CYSTOSCOPY  02/02/2015   Procedure: CYSTOSCOPY;  Surgeon: Nadara Mustard, MD;  Location: [facility] ORS;  Service: Gynecology;;  . Joya Gaskins  AND CURETTAGE OF UTERUS  2003, 2005, 2008  . ESOPHAGOGASTRODUODENOSCOPY N/A 05/31/2019   Procedure: ESOPHAGOGASTRODUODENOSCOPY (EGD);  Surgeon: Earline Mayotte, MD;  Location: [facility] ORS;  Service: General;  Laterality: N/A;  in the O.R.  . ESOPHAGOGASTRODUODENOSCOPY (EGD) WITH PROPOFOL N/A 11/26/2016   Procedure: ESOPHAGOGASTRODUODENOSCOPY (EGD) WITH PROPOFOL;   Surgeon: Wyline Mood, MD;  Location: [facility] ENDOSCOPY;  Service: Gastroenterology;  Laterality: N/A;  . HERNIA REPAIR  1999   Umbilical  . KNEE ARTHROSCOPY Right 2006  . LAPAROSCOPIC BILATERAL SALPINGECTOMY Bilateral 02/02/2015   Procedure: LAPAROSCOPIC BILATERAL SALPINGECTOMY;  Surgeon: Nadara Mustard, MD;  Location: [facility] ORS;  Service: Gynecology;  Laterality: Bilateral;  . LAPAROSCOPIC HYSTERECTOMY N/A 02/02/2015   Procedure: HYSTERECTOMY TOTAL LAPAROSCOPIC;  Surgeon: Nadara Mustard, MD;  Location: [facility] ORS;  Service: Gynecology;  Laterality: N/A;  . LOOP RECORDER INSERTION N/A 12/20/2016   Procedure: LOOP RECORDER INSERTION;  Surgeon: Regan Lemming, MD;  Location: [facility]ASIVE [facility] LAB;  Service: Cardiovascular;  Laterality: N/A;  . REDUCTION MAMMAPLASTY Bilateral December 2015  . TEE WITHOUT CARDIOVERSION N/A 12/20/2016   Procedure: TRANSESOPHAGEAL ECHOCARDIOGRAM (TEE);  Surgeon: Lars Masson, MD;  Location: [facility] ENDOSCOPY;  Service: Cardiovascular;  Laterality: N/A;  . TEE WITHOUT CARDIOVERSION N/A 03/29/2020   Procedure: TRANSESOPHAGEAL ECHOCARDIOGRAM (TEE);  Surgeon: Debbe Odea, MD;  Location: [facility] ORS;  Service: Cardiovascular;  Laterality: N/A;    Current Medications: Current Meds  Medication Sig  . albuterol (VENTOLIN HFA) 108 (90 Base) MCG/ACT inhaler Inhale 2 puffs into the lungs every 6 (six) hours as needed for wheezing or shortness of breath.  . ALPRAZolam (XANAX XR) 1 MG 24 hr tablet Take 1 tablet (1 mg total) by mouth every morning.  . Ascorbic Acid (VITAMIN C) 1000 MG tablet Take 1,000 mg by mouth daily.  Marland Kitchen aspirin EC 81 MG tablet Take 81 mg by mouth daily.  Marland Kitchen atorvastatin (LIPITOR) 80 MG tablet Take 1 tablet (80 mg total) by mouth daily.  . cyclobenzaprine (FLEXERIL) 10 MG tablet Take 10 mg by mouth at bedtime.  . digoxin (LANOXIN) 0.125 MG tablet Take 1 tablet (0.125 mg total) by mouth daily.  Marland Kitchen icosapent Ethyl (VASCEPA) 1 g capsule TAKE 1 CAPSULE BY MOUTH  EVERY MORNING, AT NOON, AND AT BEDTIME  . lamoTRIgine (LAMICTAL) 25 MG tablet Take 25 mg by mouth 2 (two) times daily.  . midodrine (PROAMATINE) 5 MG tablet Take 1 tablet (5 mg total) by mouth 3 (three) times daily with meals.  . pantoprazole (PROTONIX) 40 MG tablet Take 1 tablet (40 mg total) by mouth every morning.  Marland Kitchen PARoxetine (PAXIL) 20 MG tablet Take 3 tablets (60 mg total) by mouth daily.  Marland Kitchen UBRELVY 100 MG TABS Take 100 mg by mouth daily as needed (migraine).   . Vitamin D, Ergocalciferol, (DRISDOL) 1.25 MG (50000 UNIT) CAPS capsule Take 1 capsule (50,000 Units total) by mouth every 7 (seven) days. (Patient taking differently: Take 50,000 Units by mouth every Monday.)  . zinc gluconate 50 MG tablet Take 50 mg by mouth daily.  Marland Kitchen zolpidem (AMBIEN CR) 12.5 MG CR tablet Take 1 tablet (12.5 mg total) by mouth at bedtime as needed for sleep.     Allergies:   Azithromycin and Penicillins   Social History   Socioeconomic History  . Marital status: Married    Spouse name: Jonny Ruiz   . Number of children: 4  . Years of education: Not on file  . Highest education level: Not on file  Occupational History  .  Occupation: Agricultural engineeraccountant    Employer: [employer]  Tobacco Use  . Smoking status: Never Smoker  . Smokeless tobacco: Never Used  Vaping Use  . Vaping Use: Never used  Substance and Sexual Activity  . Alcohol use: No    Alcohol/week: 0.0 standard drinks  . Drug use: No  . Sexual activity: Yes    Partners: Male  Other Topics Concern  . Not on file  Social History Narrative   First husband died suddenly in 2016   Remarried 06/07/2016   Social Determinants of Health   Financial Resource Strain: Not on file  Food Insecurity: Not on file  Transportation Needs: Not on file  Physical Activity: Not on file  Stress: Not on file  Social Connections: Not on file     Family History: The patient's family history includes Heart Problems in her father; Hyperlipidemia in her mother;  Hypertension in her mother; Prostate cancer in her maternal grandfather; Von Willebrand disease in her maternal uncle.  ROS:   Please see the history of present illness.     All other systems reviewed and are negative.  EKGs/Labs/Other Studies Reviewed:    The following studies were reviewed today:   EKG:  EKG not  ordered today.    Recent Labs: 05/31/2019: TSH 0.561 02/23/2020: B Natriuretic Peptide 19.3 02/24/2020: ALT 46; Magnesium 2.2; Magnesium 2.2 03/16/2020: BUN 13; Creatinine, Ser 1.09; Hemoglobin 12.2; Platelets 254; Potassium 4.0; Sodium 138  Recent Lipid Panel    Component Value Date/Time   CHOL 193 05/04/2019 0929   CHOL 161 09/12/2017 0838   TRIG 305 (H) 05/04/2019 0929   HDL 34 (L) 05/04/2019 0929   HDL 43 09/12/2017 0838   CHOLHDL 5.7 (H) 05/04/2019 0929   VLDL 76 (H) 12/19/2016 0009   LDLCALC 118 (H) 05/04/2019 0929     Risk Assessment/Calculations:      Physical Exam:    VS:  BP 102/74 (BP Location: Left Arm, Patient Position: Sitting, Cuff Size: Normal)   Pulse 74   Ht 5\' 8"  (1.727 m)   Wt 196 lb (88.9 kg)   LMP 01/15/2015 (Exact Date)   SpO2 99%   BMI 29.80 kg/m     Wt Readings from Last 3 Encounters:  04/27/20 196 lb (88.9 kg)  03/29/20 198 lb (89.8 kg)  03/21/20 192 lb 11.2 oz (87.4 kg)     GEN:  Well nourished, well developed in no acute distress HEENT: Normal NECK: No JVD; No carotid bruits LYMPHATICS: No lymphadenopathy CARDIAC: Tachycardic, no murmurs, rubs, gallops RESPIRATORY:  Clear to auscultation without rales, wheezing or rhonchi  ABDOMEN: Soft, non-tender, non-distended MUSCULOSKELETAL:  No edema; No deformity  SKIN: Warm and dry NEUROLOGIC:  Alert and oriented x 3 PSYCHIATRIC:  Normal affect   ASSESSMENT:    1. Cerebrovascular accident (CVA), unspecified mechanism (HCC)   2. Atrial septal aneurysm   3. Inappropriate sinus tachycardia   4. Orthostatic hypotension   5. Mixed hyperlipidemia    PLAN:    In order of  problems listed above:  1. Patient with multiple strokes, TIA in the past.  R with no evidence for A. fib or flutter.  TEE showed aneurysmal atrial septum.  Refer to structural heart team for atrial septal device consideration due to recurrent strokes. 2. Inappropriate sinus tach, she failed management with beta-blockers due to worsening orthostasis.  Ivabradine not covered by insurance.  Continue digoxin.. 3. History of orthostasis, dizziness upon standing from seated position.  Start midodrine 5 mg 3  times daily. 4. Hyperlipidemia, hypertriglyceridemia, continue Vascepa and Lipitor.  Repeat fasting lipid profile in 3 months, follow-up in 3 months  Follow-up in 3 months.     Medication Adjustments/Labs and Tests Ordered: Current medicines are reviewed at length with the patient today.  Concerns regarding medicines are outlined above.  Orders Placed This Encounter  Procedures  . Lipid panel   Meds ordered this encounter  Medications  . midodrine (PROAMATINE) 5 MG tablet    Sig: Take 1 tablet (5 mg total) by mouth 3 (three) times daily with meals.    Dispense:  90 tablet    Refill:  3    Patient Instructions  Medication Instructions:  Your physician has recommended you make the following change in your medication:   1.  START Midodrine 5 MG THREE times daily.  *If you need a refill on your cardiac medications before your next appointment, please call your pharmacy*   Lab Work:  Your physician recommends that you return for a FASTING lipid profile:  Just prior to your 3 month follow up.  - You will need to be fasting. Please do not have anything to eat or drink after midnight the morning you have the lab work. You may only have water or black coffee with no cream or sugar. - Please go to the [facility]. You will check in at the front desk to the right as you walk into the atrium. Valet Parking is offered if needed. - No appointment needed. You may go any day between 7 am  and 6 pm.   Testing/Procedures: None ordered   Follow-Up: At [facility], you and your health needs are our priority.  As part of our continuing mission to provide you with exceptional heart care, we have created designated Provider Care Teams.  These Care Teams include your primary Cardiologist (physician) and Advanced Practice Providers (APPs -  Physician Assistants and Nurse Practitioners) who all work together to provide you with the care you need, when you need it.  We recommend signing up for the patient portal called "MyChart".  Sign up information is provided on this After Visit Summary.  MyChart is used to connect with patients for Virtual Visits (Telemedicine).  Patients are able to view lab/test results, encounter notes, upcoming appointments, etc.  Non-urgent messages can be sent to your provider as well.   To learn more about what you can do with MyChart, go to [email or web address].    Your next appointment:   3 month(s)  The format for your next appointment:   In Person  Provider:   Debbe Odea, MD   Other Instructions      Signed, Debbe Odea, MD  04/27/2020 12:34 PM    [facility]

## 2020-04-27 NOTE — Patient Instructions (Signed)
Medication Instructions:  Your physician has recommended you make the following change in your medication:   1.  START Midodrine 5 MG THREE times daily.  *If you need a refill on your cardiac medications before your next appointment, please call your pharmacy*   Lab Work:  Your physician recommends that you return for a FASTING lipid profile:  Just prior to your 3 month follow up.  - You will need to be fasting. Please do not have anything to eat or drink after midnight the morning you have the lab work. You may only have water or black coffee with no cream or sugar. - Please go to the [facility]. You will check in at the front desk to the right as you walk into the atrium. Valet Parking is offered if needed. - No appointment needed. You may go any day between 7 am and 6 pm.   Testing/Procedures: None ordered   Follow-Up: At [facility], you and your health needs are our priority.  As part of our continuing mission to provide you with exceptional heart care, we have created designated Provider Care Teams.  These Care Teams include your primary Cardiologist (physician) and Advanced Practice Providers (APPs -  Physician Assistants and Nurse Practitioners) who all work together to provide you with the care you need, when you need it.  We recommend signing up for the patient portal called "MyChart".  Sign up information is provided on this After Visit Summary.  MyChart is used to connect with patients for Virtual Visits (Telemedicine).  Patients are able to view lab/test results, encounter notes, upcoming appointments, etc.  Non-urgent messages can be sent to your provider as well.   To learn more about what you can do with MyChart, go to [email or web address].    Your next appointment:   3 month(s)  The format for your next appointment:   In Person  Provider:   Debbe Odea, MD   Other Instructions

## 2020-05-03 NOTE — Progress Notes (Signed)
[facility] Summary Report / Loop Recorder 

## 2020-05-08 ENCOUNTER — Institutional Professional Consult (permissible substitution): Payer: BC Managed Care – PPO | Admitting: Cardiovascular Disease

## 2020-05-08 ENCOUNTER — Telehealth: Payer: Self-pay | Admitting: Cardiovascular Disease

## 2020-05-08 NOTE — Telephone Encounter (Signed)
Rescheduled consult to 05/19/20.  The patient was grateful for call and agrees with plan.

## 2020-05-08 NOTE — Telephone Encounter (Signed)
Patient needs to reschedule a consult for Dr. Excell Seltzer for today at 3:40 pm.

## 2020-05-10 ENCOUNTER — Ambulatory Visit: Payer: BC Managed Care – PPO | Admitting: Pulmonary Disease

## 2020-05-18 DIAGNOSIS — U071 COVID-19: Secondary | ICD-10-CM | POA: Diagnosis not present

## 2020-05-18 DIAGNOSIS — J9601 Acute respiratory failure with hypoxia: Secondary | ICD-10-CM | POA: Diagnosis not present

## 2020-05-18 DIAGNOSIS — J1282 Pneumonia due to coronavirus disease 2019: Secondary | ICD-10-CM | POA: Diagnosis not present

## 2020-05-19 ENCOUNTER — Ambulatory Visit (INDEPENDENT_AMBULATORY_CARE_PROVIDER_SITE_OTHER): Payer: BC Managed Care – PPO | Admitting: Cardiovascular Disease

## 2020-05-19 ENCOUNTER — Encounter: Payer: Self-pay | Admitting: Cardiovascular Disease

## 2020-05-19 ENCOUNTER — Other Ambulatory Visit: Payer: Self-pay

## 2020-05-19 VITALS — BP 110/84 | HR 101 | Ht 68.0 in | Wt 198.2 lb

## 2020-05-19 DIAGNOSIS — I639 Cerebral infarction, unspecified: Secondary | ICD-10-CM

## 2020-05-19 NOTE — Progress Notes (Signed)
[facility]ENTER   STRUCTURAL HEART TEAM  Date:  05/22/2020   ID:  [name] Note Alicia [name] 19-Sep-1970, MRN [id number]  PCP:  Alba Cory, MD   Chief Complaint  Patient presents with  . PFO     HISTORY OF PRESENT ILLNESS: Alicia [name] is a 50 y.o. female who presents for evaluation of PFO, referred by Dr Azucena Cecil for evaluation of PFO management.  The patient is here alone today. She reports her first stroke Halloween night in 2018. She felt very dizzy and wasn't able to move normally. She also reported visual symptoms with twitching of her eyes, nausea, and vomiting. The following spring she had left sided facial numbness and left sided weakness involving the arm and leg.  The patient has had no recurrent stroke symptoms.  Prior imaging studies have confirmed left cerebellar and occipital infarcts.  There has been a question of fibromuscular dysplasia and the patient is going to undergo further evaluation at [facility].  The patient did undergo hypercoagulable work-up back in 2018 and this was negative.  She has had problems with fatigue, cognitive impairment and anxiety.  She has been followed for orthostatic intolerance and tachycardia.  Transesophageal echocardiogram was recently completed and demonstrates redundancy of the interatrial septum.  Bubble study was negative there is question of the presence of PFO as she meets criteria for atrial septal aneurysm.  Past Medical History:  Diagnosis Date  . Anemia   . Anxiety   . Depression   . Dyspnea    SINCE STROKE  . GERD (gastroesophageal reflux disease)   . Hyperlipidemia   . Hypertension   . IBS (irritable bowel syndrome)   . Migraine   . Previous cesarean delivery, delivered, with or without mention of antepartum condition 01/19/2012  . Psychogenic nonepileptic seizure    hx/notes 12/19/2016-LAST SEIZURE IN 2019-ON NO MEDS AS OF 05-24-19  . Restless leg syndrome   . Sleep apnea    USES CPAP  . Stroke (HCC)  12/18/2016   Acute arterial ischemic stroke, multifocal, posterior circulation /notes 12/19/2016-LEFT SIDED WEAKNESS, MEMORY FATIGUE AND TROUBLE FINDING HER WORDS [facility]    Current Outpatient Medications  Medication Sig Dispense Refill  . albuterol (VENTOLIN HFA) 108 (90 Base) MCG/ACT inhaler Inhale 2 puffs into the lungs every 6 (six) hours as needed for wheezing or shortness of breath. 8 g 2  . ALPRAZolam (XANAX XR) 1 MG 24 hr tablet Take 1 tablet (1 mg total) by mouth every morning. 90 tablet 0  . Ascorbic Acid (VITAMIN C) 1000 MG tablet Take 1,000 mg by mouth daily.    Marland Kitchen aspirin EC 81 MG tablet Take 81 mg by mouth daily.    Marland Kitchen atorvastatin (LIPITOR) 80 MG tablet Take 1 tablet (80 mg total) by mouth daily. 90 tablet 1  . cyclobenzaprine (FLEXERIL) 10 MG tablet Take 10 mg by mouth at bedtime.    . digoxin (LANOXIN) 0.125 MG tablet Take 1 tablet (0.125 mg total) by mouth daily. 30 tablet 5  . icosapent Ethyl (VASCEPA) 1 g capsule TAKE 1 CAPSULE BY MOUTH EVERY MORNING, AT NOON, AND AT BEDTIME 270 capsule 1  . lamoTRIgine (LAMICTAL) 25 MG tablet Take 25 mg by mouth 2 (two) times daily.    . midodrine (PROAMATINE) 5 MG tablet Take 1 tablet (5 mg total) by mouth 3 (three) times daily with meals. 90 tablet 3  . pantoprazole (PROTONIX) 40 MG tablet Take 1 tablet (40 mg total) by mouth every morning.  90 tablet 1  . PARoxetine (PAXIL) 20 MG tablet Take 3 tablets (60 mg total) by mouth daily. 270 tablet 1  . UBRELVY 100 MG TABS Take 100 mg by mouth daily as needed (migraine).     . Vitamin D, Ergocalciferol, (DRISDOL) 1.25 MG (50000 UNIT) CAPS capsule Take 1 capsule (50,000 Units total) by mouth every 7 (seven) days. 12 capsule 1  . zinc gluconate 50 MG tablet Take 50 mg by mouth daily.    Marland Kitchen zolpidem (AMBIEN CR) 12.5 MG CR tablet Take 1 tablet (12.5 mg total) by mouth at bedtime as needed for sleep. 90 tablet 0   No current facility-administered medications for this visit.    ALLERGIES:   Azithromycin  and Penicillins   SOCIAL HISTORY:  The patient  reports that she has never smoked. She has never used smokeless tobacco. She reports that she does not drink alcohol and does not use drugs.   FAMILY HISTORY:  The patient's family history includes Heart Problems in her father; Hyperlipidemia in her mother; Hypertension in her mother; Prostate cancer in her maternal grandfather; Von Willebrand disease in her maternal uncle.   REVIEW OF SYSTEMS:  Positive for dizzy spells, syncope, chest pain, shortness of breath, and fatigue.   All other systems are reviewed and negative.   PHYSICAL EXAM: VS:  BP 110/84   Pulse (!) 101   Ht 5\' 8"  (1.727 m)   Wt 198 lb 3.2 oz (89.9 kg)   LMP 01/15/2015 (Exact Date)   SpO2 97%   BMI 30.14 kg/m  , BMI Body mass index is 30.14 kg/m. GEN: Well nourished, well developed, in no acute distress HEENT: normal Neck: No JVD. carotids 2+ without bruits or masses Cardiac: The heart is RRR without murmurs, rubs, or gallops. No edema. Pedal pulses 2+ = bilaterally  Respiratory:  clear to auscultation bilaterally GI: soft, nontender, nondistended, + BS MS: no deformity or atrophy Skin: warm and dry, no rash Neuro:  Strength and sensation are intact Psych: euthymic mood, full affect  EKG:  EKG from today reviewed and demonstrates sinus tachycardia 101 bpm, nonspecific T wave flattening  RECENT LABS: 05/31/2019: TSH 0.561 02/23/2020: B Natriuretic Peptide 19.3 02/24/2020: ALT 46; Magnesium 2.2; Magnesium 2.2 03/16/2020: BUN 13; Creatinine, Ser 1.09; Hemoglobin 12.2; Platelets 254; Potassium 4.0; Sodium 138  No results found for requested labs within last 8760 hours.   CrCl cannot be calculated (Patient's most recent lab result is older than the maximum 21 days allowed.).   Wt Readings from Last 3 Encounters:  05/19/20 198 lb 3.2 oz (89.9 kg)  04/27/20 196 lb (88.9 kg)  03/29/20 198 lb (89.8 kg)     PERTINENT STUDIES:  Transcranial Doppler: pending  MRI Brain:  09-30-2017: FINDINGS:   Small focus of T2 hyperintensity in the right thalamus. This lesion is bright on both DWI and ADC. No discernible areas of encephalomalacia corresponding to the known prior left PCA and PICA territory infarcts reported on outside institution MRI in 2018. There is no midline shift or mass lesion. There is no evidence of intracranial hemorrhage or acute infarct.    IMPRESSION:  Small focus of T2 hyperintensityin the right thalamus which may correspond to prior right thalamic infarct.   No evidence of acute infarct.    TEE:  In brief, imaging revealed normal LV function with no RWMAs and no mural  apical thrombus. . Estimated ejection fraction was 60%. Right sided  cardiac chambers were normal with no evidence of pulmonary  hypertension.   Imaging of the septum showed no ASD or VSD, Interatrial septum appears  aneurysmal.  Bubble study was negative for shunt  2D and color flow confirmed no PFO    The LA was well visualized in orthogonal views. There was no spontaneous  contrast and no thrombus in the LA and LA appendage   The descending thoracic aorta had no mural aortic debris with no evidence  of aneurysmal dilation or disection   ASSESSMENT AND PLAN: 1.  Possible PFO with atrial septal aneurysm:   Radiographic results, hospital notes, lab results, and cardiac imaging data are reviewed. TEE images are reviewed and demonstrate normal left ventricular size and systolic function with no significant valvular heart disease.  There is presence of interatrial septal aneurysm and some suggestion of PFO, although I cannot appreciate any clear color flow across the interatrial septum.  Bubble study is negative, but bubbles did not reach the septum well.  We had a lengthy discussion today about the association of PFO and cryptogenic stroke.  We discussed data supporting transcatheter PFO closure for secondary stroke prevention.  At this point, it is unclear whether  the patient truly has a defect in the interatrial septum.  I have recommended a transcranial Doppler study for further evaluation, since her transesophageal echo is equivocal.  She will otherwise be continued on her current medical therapy which includes aspirin for antiplatelet therapy and a high intensity statin drug.  I demonstrated the PFO occluder devices to the patient today and explained the procedure in case that a PFO is confirmed.  Would only consider transcatheter PFO closure of transcranial Doppler is suggestive of a moderate to large right to left shunt.  We will follow up with her after her transcranial Doppler is completed.  Tonny Bollman 05/22/2020 6:15 AM

## 2020-05-22 ENCOUNTER — Telehealth: Payer: Self-pay

## 2020-05-22 ENCOUNTER — Encounter: Payer: Self-pay | Admitting: Cardiovascular Disease

## 2020-05-22 NOTE — Telephone Encounter (Signed)
Scheduled the patient for transcranial doppler 4/12 at 1300. Will send MyChart message with directions. The patient was grateful for call and agrees with plan.

## 2020-05-27 LAB — CUP PACEART REMOTE DEVICE CHECK
Date Time Interrogation Session: 20220403013054
Implantable Pulse Generator Implant Date: 20181103

## 2020-05-29 ENCOUNTER — Ambulatory Visit (INDEPENDENT_AMBULATORY_CARE_PROVIDER_SITE_OTHER): Payer: BC Managed Care – PPO

## 2020-05-29 DIAGNOSIS — I639 Cerebral infarction, unspecified: Secondary | ICD-10-CM

## 2020-05-30 ENCOUNTER — Ambulatory Visit (HOSPITAL_COMMUNITY)
Admission: RE | Admit: 2020-05-30 | Discharge: 2020-05-30 | Disposition: A | Discharge status: Home / Self Care | Payer: BC Managed Care – PPO | Source: Ambulatory Visit | Attending: Cardiovascular Disease | Admitting: Cardiovascular Disease

## 2020-05-30 ENCOUNTER — Other Ambulatory Visit: Payer: Self-pay

## 2020-05-30 DIAGNOSIS — I639 Cerebral infarction, unspecified: Secondary | ICD-10-CM | POA: Diagnosis not present

## 2020-05-30 DIAGNOSIS — R198 Other specified symptoms and signs involving the digestive system and abdomen: Secondary | ICD-10-CM | POA: Diagnosis not present

## 2020-05-30 DIAGNOSIS — K862 Cyst of pancreas: Secondary | ICD-10-CM | POA: Diagnosis not present

## 2020-05-30 DIAGNOSIS — R1033 Periumbilical pain: Secondary | ICD-10-CM | POA: Diagnosis not present

## 2020-05-30 DIAGNOSIS — K219 Gastro-esophageal reflux disease without esophagitis: Secondary | ICD-10-CM | POA: Diagnosis not present

## 2020-05-30 NOTE — Progress Notes (Signed)
TCD bubble study completed.   Please see CV Proc for preliminary results.   Jean Rosenthal, RDMS, RVT

## 2020-06-13 NOTE — Progress Notes (Signed)
[facility] Summary Report / Loop Recorder 
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
## 2020-06-16 ENCOUNTER — Ambulatory Visit: Payer: BC Managed Care – PPO | Admitting: Family Medicine

## 2020-06-16 ENCOUNTER — Other Ambulatory Visit: Payer: Self-pay

## 2020-06-16 ENCOUNTER — Encounter: Payer: Self-pay | Admitting: Family Medicine

## 2020-06-16 VITALS — BP 116/72 | HR 124 | Temp 98.1°F | Resp 16 | Ht 68.0 in | Wt 200.0 lb

## 2020-06-16 DIAGNOSIS — G43009 Migraine without aura, not intractable, without status migrainosus: Secondary | ICD-10-CM

## 2020-06-16 DIAGNOSIS — K219 Gastro-esophageal reflux disease without esophagitis: Secondary | ICD-10-CM | POA: Diagnosis not present

## 2020-06-16 DIAGNOSIS — G4733 Obstructive sleep apnea (adult) (pediatric): Secondary | ICD-10-CM

## 2020-06-16 DIAGNOSIS — M310 Hypersensitivity angiitis: Secondary | ICD-10-CM

## 2020-06-16 DIAGNOSIS — R7303 Prediabetes: Secondary | ICD-10-CM

## 2020-06-16 DIAGNOSIS — F5104 Psychophysiologic insomnia: Secondary | ICD-10-CM

## 2020-06-16 DIAGNOSIS — F330 Major depressive disorder, recurrent, mild: Secondary | ICD-10-CM | POA: Diagnosis not present

## 2020-06-16 DIAGNOSIS — R569 Unspecified convulsions: Secondary | ICD-10-CM

## 2020-06-16 DIAGNOSIS — J683 Other acute and subacute respiratory conditions due to chemicals, gases, fumes and vapors: Secondary | ICD-10-CM

## 2020-06-16 DIAGNOSIS — F411 Generalized anxiety disorder: Secondary | ICD-10-CM | POA: Diagnosis not present

## 2020-06-16 DIAGNOSIS — I69354 Hemiplegia and hemiparesis following cerebral infarction affecting left non-dominant side: Secondary | ICD-10-CM

## 2020-06-16 DIAGNOSIS — E785 Hyperlipidemia, unspecified: Secondary | ICD-10-CM

## 2020-06-16 DIAGNOSIS — E559 Vitamin D deficiency, unspecified: Secondary | ICD-10-CM

## 2020-06-16 MED ORDER — ZOLPIDEM TARTRATE ER 12.5 MG PO TBCR
12.5000 mg | EXTENDED_RELEASE_TABLET | Freq: Every evening | ORAL | 0 refills | Status: DC | PRN
Start: 2020-06-16 — End: 2020-09-04

## 2020-06-16 MED ORDER — VITAMIN D (ERGOCALCIFEROL) 1.25 MG (50000 UNIT) PO CAPS: 50000.0000 [IU] | ORAL_CAPSULE | ORAL | 1 refills | Status: DC

## 2020-06-16 MED ORDER — PAROXETINE HCL 20 MG PO TABS: 60.0000 mg | ORAL_TABLET | Freq: Every day | ORAL | 1 refills | Status: DC

## 2020-06-16 MED ORDER — CYCLOBENZAPRINE HCL 10 MG PO TABS: 10.0000 mg | ORAL_TABLET | Freq: Every day | ORAL | 1 refills | Status: DC

## 2020-06-16 MED ORDER — ALPRAZOLAM ER 1 MG PO TB24: 1.0000 mg | ORAL_TABLET | ORAL | 0 refills | Status: DC

## 2020-06-16 MED ORDER — PANTOPRAZOLE SODIUM 40 MG PO TBEC: 40.0000 mg | DELAYED_RELEASE_TABLET | ORAL | 1 refills | Status: DC

## 2020-06-17 DIAGNOSIS — J9601 Acute respiratory failure with hypoxia: Secondary | ICD-10-CM | POA: Diagnosis not present

## 2020-06-17 DIAGNOSIS — J1282 Pneumonia due to coronavirus disease 2019: Secondary | ICD-10-CM | POA: Diagnosis not present

## 2020-06-17 DIAGNOSIS — U071 COVID-19: Secondary | ICD-10-CM | POA: Diagnosis not present

## 2020-06-26 ENCOUNTER — Ambulatory Visit (INDEPENDENT_AMBULATORY_CARE_PROVIDER_SITE_OTHER): Payer: BC Managed Care – PPO | Admitting: Pulmonary Disease

## 2020-06-26 ENCOUNTER — Other Ambulatory Visit: Payer: Self-pay

## 2020-06-26 ENCOUNTER — Encounter: Payer: Self-pay | Admitting: Pulmonary Disease

## 2020-06-26 VITALS — BP 110/68 | HR 102 | Temp 97.1°F | Ht 68.0 in | Wt 197.8 lb

## 2020-06-26 DIAGNOSIS — Z9989 Dependence on other enabling machines and devices: Secondary | ICD-10-CM | POA: Diagnosis not present

## 2020-06-26 DIAGNOSIS — G4733 Obstructive sleep apnea (adult) (pediatric): Secondary | ICD-10-CM

## 2020-06-26 DIAGNOSIS — R0602 Shortness of breath: Secondary | ICD-10-CM

## 2020-06-26 DIAGNOSIS — J683 Other acute and subacute respiratory conditions due to chemicals, gases, fumes and vapors: Secondary | ICD-10-CM

## 2020-06-26 MED ORDER — BREO ELLIPTA 100-25 MCG/INH IN AEPB: 1.0000 | INHALATION_SPRAY | Freq: Every day | RESPIRATORY_TRACT | 6 refills | Status: DC

## 2020-06-26 MED ORDER — BREO ELLIPTA 100-25 MCG/INH IN AEPB: 1.0000 | INHALATION_SPRAY | Freq: Every day | RESPIRATORY_TRACT | 0 refills | Status: DC

## 2020-06-26 NOTE — Patient Instructions (Signed)
We will repeat breathing tests.  We are giving a trial of Breo Ellipta 1 inhalation daily.  Make sure you rinse your mouth well after you use it.  Follow-up in 6 to 8 weeks time call sooner should any new problems arise.  We will discuss breathing tests on follow-up.

## 2020-06-26 NOTE — Progress Notes (Signed)
Subjective:    Patient ID: Alicia [name], female    DOB: 1970-10-16, 50 y.o.   MRN: [id number] Chief Complaint  Patient presents with  . Follow-up    Had Covid in December and breathing hasn't been very good since, coughing, wheezing, sob.    HPI Patient is a 50 year old lifelong never smoker who presents for evaluation of shortness of breath increasing since COVID-19 diagnosis in December.  Her primary care physician is Dr. Alba CoryKrichna Sowles.  Patient had been seen here previously in 2020 with complaint of dyspnea with no specific diagnosis rendered except perhaps reactive airways disease.  She was unable to follow-up as requested due to multiple other issues that developed including a TIA and other.  She developed COVID-19 in December tested positive on 17 December and was noted to have bilateral groundglass opacities in her lung at that time.  She did not require mechanical ventilation.  Most recent chest x-ray after COVID-19 infection in February actually looked at with very minimal residual if any.  Her COVID diagnosis she has noted occasional shortness of breath that is relieved with albuterol.  She also notes dry hacking cough.  She has issues with gastroesophageal reflux that are not well controlled with Protonix and she is currently under the care of gastroenterology for these issues.  She has issues with persistent tachycardia.  Syncope after having had a 2D echo with bubble contrast, she developed shortness of breath after the contrast and "passed out".  She was noted to have an aneurysmal atrial septum however no overt PFO or ASD noted.  She is currently being followed for these issues.  She requires midodrine due to hypotension.  She has not had any fevers, chills or sweats since her COVID-19 diagnosis.  She has occasional mid sternal chest pain associated with heartburn symptoms.  She does not endorse any other symptomatology.   Review of Systems A 10 point review of systems was  performed and it is as noted above otherwise negative.  Past Medical History:  Diagnosis Date  . Anemia   . Anxiety   . Depression   . Dyspnea    SINCE STROKE  . GERD (gastroesophageal reflux disease)   . Hyperlipidemia   . Hypertension   . IBS (irritable bowel syndrome)   . Migraine   . Previous cesarean delivery, delivered, with or without mention of antepartum condition 01/19/2012  . Psychogenic nonepileptic seizure    hx/notes 12/19/2016-LAST SEIZURE IN 2019-ON NO MEDS AS OF 05-24-19  . Restless leg syndrome   . Sleep apnea    USES CPAP  . Stroke (HCC) 12/18/2016   Acute arterial ischemic stroke, multifocal, posterior circulation /notes 12/19/2016-LEFT SIDED WEAKNESS, MEMORY FATIGUE AND TROUBLE FINDING HER WORDS [facility]   Past Surgical History:  Procedure Laterality Date  . ABDOMINOPLASTY  Feb. 2015  . CESAREAN SECTION  2011  . CHOLECYSTECTOMY N/A 05/31/2019   Procedure: LAPAROSCOPIC CHOLECYSTECTOMY WITH INTRAOPERATIVE CHOLANGIOGRAM;  Surgeon: Earline MayotteByrnett, Jeffrey W, MD;  Location: [facility] ORS;  Service: General;  Laterality: N/A;  . COLONOSCOPY WITH PROPOFOL N/A 11/26/2016   Procedure: COLONOSCOPY WITH PROPOFOL;  Surgeon: Wyline MoodAnna, Kiran, MD;  [facility];  Service: Gastroenterology;  Laterality: N/A;  . CYSTOSCOPY  02/02/2015   Procedure: CYSTOSCOPY;  Surgeon: Nadara Mustardobert P Harris, MD;  Location: [facility] ORS;  Service: Gynecology;;  . Joya GaskinsILATION AND CURETTAGE OF UTERUS  2003, 2005, 2008  . ESOPHAGOGASTRODUODENOSCOPY N/A 05/31/2019   Procedure: ESOPHAGOGASTRODUODENOSCOPY (EGD);  Surgeon: Earline MayotteByrnett, Jeffrey W, MD;  [facility];  Service: General;  Laterality: N/A;  in the O.R.  . ESOPHAGOGASTRODUODENOSCOPY (EGD) WITH PROPOFOL N/A 11/26/2016   Procedure: ESOPHAGOGASTRODUODENOSCOPY (EGD) WITH PROPOFOL;  Surgeon: Wyline Mood, MD;  Location: [facility] ENDOSCOPY;  Service: Gastroenterology;  Laterality: N/A;  . HERNIA REPAIR  1999   Umbilical  . KNEE ARTHROSCOPY Right 2006  . LAPAROSCOPIC  BILATERAL SALPINGECTOMY Bilateral 02/02/2015   Procedure: LAPAROSCOPIC BILATERAL SALPINGECTOMY;  Surgeon: Nadara Mustard, MD;  Location: [facility] ORS;  Service: Gynecology;  Laterality: Bilateral;  . LAPAROSCOPIC HYSTERECTOMY N/A 02/02/2015   Procedure: HYSTERECTOMY TOTAL LAPAROSCOPIC;  Surgeon: Nadara Mustard, MD;  Location: [facility] ORS;  Service: Gynecology;  Laterality: N/A;  . LOOP RECORDER INSERTION N/A 12/20/2016   Procedure: LOOP RECORDER INSERTION;  Surgeon: Regan Lemming, MD;  [facility];  Service: Cardiovascular;  Laterality: N/A;  . REDUCTION MAMMAPLASTY Bilateral December 2015  . TEE WITHOUT CARDIOVERSION N/A 12/20/2016   Procedure: TRANSESOPHAGEAL ECHOCARDIOGRAM (TEE);  Surgeon: Lars Masson, MD;  [facility] ENDOSCOPY;  Service: Cardiovascular;  Laterality: N/A;  . TEE WITHOUT CARDIOVERSION N/A 03/29/2020   Procedure: TRANSESOPHAGEAL ECHOCARDIOGRAM (TEE);  Surgeon: Debbe Odea, MD;  Location: [facility] ORS;  Service: Cardiovascular;  Laterality: N/A;   Family History  Problem Relation Age of Onset  . Hypertension Mother   . Hyperlipidemia Mother   . Heart Problems Father        hole in heart and lower ventricles reversed  . Prostate cancer Maternal Grandfather   . Von Willebrand disease Maternal Uncle    Social History   Tobacco Use  . Smoking status: Never Smoker  . Smokeless tobacco: Never Used  Substance Use Topics  . Alcohol use: No    Alcohol/week: 0.0 standard drinks   Allergies  Allergen Reactions  . Azithromycin Diarrhea  . Penicillins Hives and Swelling    Has patient had a PCN reaction causing immediate rash, facial/tongue/throat swelling, SOB or lightheadedness with hypotension: Yes Has patient had a PCN reaction causing severe rash involving mucus membranes or skin necrosis: No Has patient had a PCN reaction that required hospitalization No Has patient had a PCN reaction occurring within the last 10 years: No If all of the above  answers are "NO", then may proceed with Cephalosporin use.   Current Meds  Medication Sig  . albuterol (VENTOLIN HFA) 108 (90 Base) MCG/ACT inhaler Inhale 2 puffs into the lungs every 6 (six) hours as needed for wheezing or shortness of breath.  . ALPRAZolam (XANAX XR) 1 MG 24 hr tablet Take 1 tablet (1 mg total) by mouth every morning.  . Ascorbic Acid (VITAMIN C) 1000 MG tablet Take 1,000 mg by mouth daily.  Marland Kitchen aspirin EC 81 MG tablet Take 81 mg by mouth daily.  Marland Kitchen atorvastatin (LIPITOR) 80 MG tablet Take 1 tablet (80 mg total) by mouth daily.  . cyclobenzaprine (FLEXERIL) 10 MG tablet Take 1 tablet (10 mg total) by mouth at bedtime.  . digoxin (LANOXIN) 0.125 MG tablet Take 1 tablet (0.125 mg total) by mouth daily.  Marland Kitchen icosapent Ethyl (VASCEPA) 1 g capsule TAKE 1 CAPSULE BY MOUTH EVERY MORNING, AT NOON, AND AT BEDTIME  . lamoTRIgine (LAMICTAL) 25 MG tablet Take 25 mg by mouth 2 (two) times daily.  . midodrine (PROAMATINE) 5 MG tablet Take 1 tablet (5 mg total) by mouth 3 (three) times daily with meals.  . pantoprazole (PROTONIX) 40 MG tablet Take 1 tablet (40 mg total) by mouth every morning.  Marland Kitchen PARoxetine (PAXIL) 20  MG tablet Take 3 tablets (60 mg total) by mouth daily.  Marland Kitchen UBRELVY 100 MG TABS Take 100 mg by mouth daily as needed (migraine).   . Vitamin D, Ergocalciferol, (DRISDOL) 1.25 MG (50000 UNIT) CAPS capsule Take 1 capsule (50,000 Units total) by mouth every 7 (seven) days.  Marland Kitchen zinc gluconate 50 MG tablet Take 50 mg by mouth daily.  Marland Kitchen zolpidem (AMBIEN CR) 12.5 MG CR tablet Take 1 tablet (12.5 mg total) by mouth at bedtime as needed for sleep.   Immunization History  Administered Date(s) Administered  . Influenza,inj,Quad PF,6+ Mos 11/22/2014, 11/01/2015, 11/10/2018, 10/15/2019  . Influenza-Unspecified 10/19/2013  . Tdap 01/23/2012       Objective:   Physical Exam BP 110/68 (BP Location: Left Arm, Patient Position: Sitting, Cuff Size: Normal)   Pulse (!) 102   Temp (!) 97.1 F  (36.2 C) (Temporal)   Ht 5\' 8"  (1.727 m)   Wt 197 lb 12.8 oz (89.7 kg)   LMP 01/15/2015 (Exact Date)   SpO2 98%   BMI 30.08 kg/m  GENERAL: Well-developed, well-nourished HEAD: Normocephalic, atraumatic.  EYES: Pupils equal, round, reactive to light.  No scleral icterus.  MOUTH: Nose/mouth/throat not examined due to masking requirements for COVID 19. NECK: Supple. No thyromegaly. Trachea midline. No JVD.  No adenopathy. PULMONARY: Good air entry bilaterally.  No adventitious sounds. CARDIOVASCULAR: S1 and S2.  Tachycardic rate and regular rhythm.  No rubs or gallops heard ABDOMEN: Benign.   MUSCULOSKELETAL: No joint deformity, no clubbing, no edema.  NEUROLOGIC: No focal deficit, no gait disturbance, speech is fluent. SKIN: Intact,warm,dry. PSYCH: Mood and behavior normal.  Chest x-ray performed 21 March 2020 no residual changes from COVID-19:         Assessment & Plan:     ICD-10-CM   1. Shortness of breath  R06.02 Pulmonary Function Test [facility] Only   Cannot ascribe to pulmonary etiology entirely Will obtain PFTs  2. Reactive airways dysfunction syndrome (HCC)  J68.3    Noted in the past We will proceed with trial of Breo Ellipta 100/25, 1 inhalation  3. OSA on CPAP  G47.33    Z99.89    Continue CPAP per primary MD   Orders Placed This Encounter  Procedures  . Pulmonary Function Test [facility] Only    Standing Status:   Future    Standing Expiration Date:   06/26/2021    Scheduling Instructions:     Next available    Order Specific Question:   Full PFT: includes the following: basic spirometry, spirometry pre & post bronchodilator, diffusion capacity (DLCO), lung volumes    Answer:   Full PFT    Order Specific Question:   This test can only be performed at    Answer:   [facility]   Meds ordered this encounter  Medications  . fluticasone furoate-vilanterol (BREO ELLIPTA) 100-25 MCG/INH AEPB    Sig: Inhale 1 puff into the lungs daily.    Dispense:  28 each     Refill:  0    Order Specific Question:   Lot Number?    Answer:   [id number]    Order Specific Question:   Expiration Date?    Answer:   11/17/2021    Order Specific Question:   Quantity    Answer:   1  . fluticasone furoate-vilanterol (BREO ELLIPTA) 100-25 MCG/INH AEPB    Sig: Inhale 1 puff into the lungs daily.    Dispense:  30 each    Refill:  6  We will see the patient follow-up in 6 to 8 weeks time.  We are giving her a trial of Breo Ellipta to see if this helps with her sensation of dyspnea.  Likely her issues are related to her ongoing problems with gastroesophageal reflux, possible chronic silent aspiration and cardiac issues and resting tachycardia.  We will review PFTs.  May need cardiopulmonary stress test.   Gailen Shelter, MD [facility]   *This note was dictated using voice recognition software/Dragon.  Despite best efforts to proofread, errors can occur which can change the meaning.  Any change was purely unintentional.

## 2020-07-03 ENCOUNTER — Ambulatory Visit (INDEPENDENT_AMBULATORY_CARE_PROVIDER_SITE_OTHER): Payer: BC Managed Care – PPO

## 2020-07-03 DIAGNOSIS — I639 Cerebral infarction, unspecified: Secondary | ICD-10-CM

## 2020-07-04 LAB — CUP PACEART REMOTE DEVICE CHECK
Date Time Interrogation Session: 20220514231048
Implantable Pulse Generator Implant Date: 20181103

## 2020-07-18 DIAGNOSIS — J9601 Acute respiratory failure with hypoxia: Secondary | ICD-10-CM | POA: Diagnosis not present

## 2020-07-18 DIAGNOSIS — U071 COVID-19: Secondary | ICD-10-CM | POA: Diagnosis not present

## 2020-07-18 DIAGNOSIS — J1282 Pneumonia due to coronavirus disease 2019: Secondary | ICD-10-CM | POA: Diagnosis not present

## 2020-07-21 ENCOUNTER — Telehealth: Payer: Self-pay | Admitting: Pulmonary Disease

## 2020-07-21 NOTE — Telephone Encounter (Signed)
Patient is aware of date/time of covid test prior to PFT.  

## 2020-07-24 ENCOUNTER — Other Ambulatory Visit
Admission: RE | Admit: 2020-07-24 | Discharge: 2020-07-24 | Disposition: A | Discharge status: Home / Self Care | Payer: BC Managed Care – PPO | Source: Ambulatory Visit | Attending: Pulmonary Disease | Admitting: Pulmonary Disease

## 2020-07-24 ENCOUNTER — Other Ambulatory Visit: Payer: Self-pay

## 2020-07-24 DIAGNOSIS — Z20822 Contact with and (suspected) exposure to covid-19: Secondary | ICD-10-CM | POA: Insufficient documentation

## 2020-07-24 DIAGNOSIS — Z01812 Encounter for preprocedural laboratory examination: Secondary | ICD-10-CM | POA: Diagnosis not present

## 2020-07-25 ENCOUNTER — Ambulatory Visit: Payer: BC Managed Care – PPO | Attending: Pulmonary Disease

## 2020-07-25 LAB — SARS CORONAVIRUS 2 (TAT 6-24 HRS): SARS Coronavirus 2: NEGATIVE

## 2020-07-26 NOTE — Progress Notes (Signed)
[facility] Summary Report / Loop Recorder 

## 2020-07-28 ENCOUNTER — Ambulatory Visit: Payer: BC Managed Care – PPO | Admitting: Cardiology

## 2020-08-07 LAB — CUP PACEART REMOTE DEVICE CHECK
Date Time Interrogation Session: 20220616232151
Implantable Pulse Generator Implant Date: 20181103

## 2020-08-08 ENCOUNTER — Encounter
Admission: RE | Disposition: A | Discharge status: Home / Self Care | Payer: Self-pay | Source: Home / Self Care | Attending: Gastroenterology

## 2020-08-08 ENCOUNTER — Encounter: Payer: Self-pay | Admitting: Anesthesiology

## 2020-08-08 ENCOUNTER — Ambulatory Visit: Payer: BC Managed Care – PPO | Admitting: Anesthesiology

## 2020-08-08 ENCOUNTER — Ambulatory Visit
Admission: RE | Admit: 2020-08-08 | Discharge: 2020-08-08 | Disposition: A | Discharge status: Home / Self Care | Payer: BC Managed Care – PPO | Attending: Gastroenterology | Admitting: Gastroenterology

## 2020-08-08 DIAGNOSIS — Z881 Allergy status to other antibiotic agents status: Secondary | ICD-10-CM | POA: Diagnosis not present

## 2020-08-08 DIAGNOSIS — E785 Hyperlipidemia, unspecified: Secondary | ICD-10-CM | POA: Insufficient documentation

## 2020-08-08 DIAGNOSIS — Z880 Allergy status to penicillin: Secondary | ICD-10-CM | POA: Insufficient documentation

## 2020-08-08 DIAGNOSIS — Z8673 Personal history of transient ischemic attack (TIA), and cerebral infarction without residual deficits: Secondary | ICD-10-CM | POA: Diagnosis not present

## 2020-08-08 DIAGNOSIS — Z90710 Acquired absence of both cervix and uterus: Secondary | ICD-10-CM | POA: Diagnosis not present

## 2020-08-08 DIAGNOSIS — Z7951 Long term (current) use of inhaled steroids: Secondary | ICD-10-CM | POA: Diagnosis not present

## 2020-08-08 DIAGNOSIS — Z7982 Long term (current) use of aspirin: Secondary | ICD-10-CM | POA: Diagnosis not present

## 2020-08-08 DIAGNOSIS — R194 Change in bowel habit: Secondary | ICD-10-CM | POA: Diagnosis not present

## 2020-08-08 DIAGNOSIS — Z9049 Acquired absence of other specified parts of digestive tract: Secondary | ICD-10-CM | POA: Diagnosis not present

## 2020-08-08 DIAGNOSIS — I498 Other specified cardiac arrhythmias: Secondary | ICD-10-CM | POA: Insufficient documentation

## 2020-08-08 DIAGNOSIS — F419 Anxiety disorder, unspecified: Secondary | ICD-10-CM | POA: Insufficient documentation

## 2020-08-08 DIAGNOSIS — Z79899 Other long term (current) drug therapy: Secondary | ICD-10-CM | POA: Diagnosis not present

## 2020-08-08 DIAGNOSIS — R1033 Periumbilical pain: Secondary | ICD-10-CM | POA: Insufficient documentation

## 2020-08-08 DIAGNOSIS — R109 Unspecified abdominal pain: Secondary | ICD-10-CM | POA: Diagnosis not present

## 2020-08-08 HISTORY — PX: COLONOSCOPY WITH PROPOFOL: SHX5780

## 2020-08-08 SURGERY — COLONOSCOPY WITH PROPOFOL
Anesthesia: General

## 2020-08-08 MED ORDER — MIDAZOLAM HCL 2 MG/2ML IJ SOLN
INTRAMUSCULAR | Status: AC
  Filled 2020-08-08: qty 2

## 2020-08-08 MED ORDER — FENTANYL CITRATE (PF) 100 MCG/2ML IJ SOLN
INTRAMUSCULAR | Status: DC | PRN
  Administered 2020-08-08: 50 ug via INTRAVENOUS
  Administered 2020-08-08 (×2): 25 ug via INTRAVENOUS

## 2020-08-08 MED ORDER — PROPOFOL 500 MG/50ML IV EMUL
INTRAVENOUS | Status: AC
  Filled 2020-08-08: qty 50

## 2020-08-08 MED ORDER — PROPOFOL 500 MG/50ML IV EMUL
INTRAVENOUS | Status: DC | PRN
  Administered 2020-08-08: 150 ug/kg/min via INTRAVENOUS

## 2020-08-08 MED ORDER — FENTANYL CITRATE (PF) 100 MCG/2ML IJ SOLN
INTRAMUSCULAR | Status: AC
  Filled 2020-08-08: qty 2

## 2020-08-08 MED ORDER — SODIUM CHLORIDE 0.9 % IV SOLN
INTRAVENOUS | Status: DC
  Administered 2020-08-08: 20 mL/h via INTRAVENOUS

## 2020-08-08 MED ORDER — MIDAZOLAM HCL 2 MG/2ML IJ SOLN
INTRAMUSCULAR | Status: DC | PRN
  Administered 2020-08-08: 2 mg via INTRAVENOUS

## 2020-08-08 NOTE — Anesthesia Preprocedure Evaluation (Addendum)
Anesthesia Evaluation  Patient identified by MRN, date of birth, ID band Patient awake    Reviewed: Allergy & Precautions, NPO status , Patient's Chart, lab work & pertinent test results  History of Anesthesia Complications Negative for: history of anesthetic complications (very anxious about anesthesia; reports remembering part of prior colonoscopy but it was RN sedation)  Airway Mallampati: II  TM Distance: >3 FB Neck ROM: Full    Dental no notable dental hx. (+) Teeth Intact, Dental Advisory Given   Pulmonary shortness of breath, sleep apnea and Continuous Positive Airway Pressure Ventilation , neg COPD, Not current smoker,    Pulmonary exam normal breath sounds clear to auscultation       Cardiovascular METShypertension, + DOE  (-) CAD and (-) Past MI (-) dysrhythmias  Rhythm:Regular Rate:Normal - Systolic murmurs  TTE 2020: 1. Left ventricular ejection fraction, by visual estimation, is 60 to 65%. The left ventricle has normal function. Normal left ventricular size. There is no left ventricular hypertrophy.  2. Left ventricular diastolic Doppler parameters are consistent with pseudonormalization pattern of LV diastolic filling.  3. Global right ventricle has normal systolic function. The right ventricular size is normal. No increase in right ventricular wall thickness.  4. Left atrial size was normal.  5. TR signal is inadequate for assessing pulmonary artery systolic pressure.    Neuro/Psych  Headaches, Seizures - (psychogenic nonepileptic seizures, last 2019),  PSYCHIATRIC DISORDERS Anxiety Depression Residual left sided weakness, "mental fogginess" CVA (2018; L sided weakness, memory issues), Residual Symptoms    GI/Hepatic GERD  Medicated,(+)     (-) substance abuse  ,   Endo/Other  neg diabetes  Renal/GU negative Renal ROS     Musculoskeletal  (+) Arthritis ,   Abdominal (+) - obese (BMI 29),   Peds   Hematology  (+) anemia ,   Anesthesia Other Findings Past Medical History: No date: Anemia No date: Anxiety No date: Depression No date: Dyspnea     Comment:  SINCE STROKE No date: GERD (gastroesophageal reflux disease) No date: Hyperlipidemia No date: Hypertension No date: IBS (irritable bowel syndrome) No date: Migraine 01/19/2012: Previous cesarean delivery, delivered, with or without  mention of antepartum condition No date: Psychogenic nonepileptic seizure     Comment:  hx/notes 12/19/2016-LAST SEIZURE IN 2019-ON NO MEDS AS OF              05-24-19 No date: Restless leg syndrome No date: Sleep apnea     Comment:  USES CPAP 12/18/2016: Stroke (HCC)     Comment:  Acute arterial ischemic stroke, multifocal, posterior               circulation /notes 12/19/2016-LEFT SIDED WEAKNESS, MEMORY              FATIGUE AND TROUBLE FINDING HER WORDS [facility]                           Anesthesia Physical  Anesthesia Plan  ASA: 3  Anesthesia Plan: General   Post-op Pain Management:    Induction: Intravenous  PONV Risk Score and Plan: 2 and Propofol infusion and TIVA  Airway Management Planned: Natural Airway and Nasal Cannula  Additional Equipment: None  Intra-op Plan:   Post-operative Plan:   Informed Consent: I have reviewed the patients History and Physical, chart, labs and discussed the procedure including the risks, benefits and alternatives for the proposed anesthesia with the patient or authorized representative who  has indicated his/her understanding and acceptance.     Dental advisory given  Plan Discussed with: Anesthesiologist and CRNA  Anesthesia Plan Comments:         Anesthesia Quick Evaluation

## 2020-08-08 NOTE — Anesthesia Postprocedure Evaluation (Signed)
Anesthesia Post Note  Patient: Alicia [name] Note [name]  Procedure(s) Performed: COLONOSCOPY WITH PROPOFOL  Patient location during evaluation: Endoscopy Anesthesia Type: General Level of consciousness: awake and alert Pain management: pain level controlled Vital Signs Assessment: post-procedure vital signs reviewed and stable Respiratory status: spontaneous breathing, nonlabored ventilation, respiratory function stable and patient connected to nasal cannula oxygen Cardiovascular status: blood pressure returned to baseline and stable Postop Assessment: no apparent nausea or vomiting Anesthetic complications: no   No notable events documented.   Last Vitals:  Vitals:   08/08/20 1208 08/08/20 1334  BP: (!) 145/98   Pulse: (!) 117   Resp: 20   Temp: (!) 36 C (!) 36.1 C  SpO2: 98% 98%    Last Pain:  Vitals:   08/08/20 1334  TempSrc: Temporal  PainSc:                  Donato Schultz

## 2020-08-08 NOTE — Interval H&P Note (Signed)
History and Physical Interval Note:  08/08/2020 12:27 PM  Alicia [name]  has presented today for surgery, with the diagnosis of periumbilical ABD.PAIN,IRREGULAR BOWEL HABITS.  The various methods of treatment have been discussed with the patient and family. After consideration of risks, benefits and other options for treatment, the patient has consented to  Procedure(s): COLONOSCOPY WITH PROPOFOL (N/A) as a surgical intervention.  The patient's history has been reviewed, patient examined, no change in status, stable for surgery.  I have reviewed the patient's chart and labs.  Questions were answered to the patient's satisfaction.     Regis Bill  Ok to proceed with colonoscopy

## 2020-08-08 NOTE — Transfer of Care (Signed)
Immediate Anesthesia Transfer of Care Note  Patient: Alicia [name] Note [name]  Procedure(s) Performed: COLONOSCOPY WITH PROPOFOL  Patient Location: PACU  Anesthesia Type:General  Level of Consciousness: awake and sedated  Airway & Oxygen Therapy: Patient Spontanous Breathing and Patient connected to nasal cannula oxygen  Post-op Assessment: Report given to RN and Post -op Vital signs reviewed and stable  Post vital signs: Reviewed and stable  Last Vitals:  Vitals Value Taken Time  BP    Temp    Pulse    Resp    SpO2      Last Pain:  Vitals:   08/08/20 1208  TempSrc: Temporal  PainSc: 0-No pain         Complications: No notable events documented.

## 2020-08-08 NOTE — Op Note (Signed)
[facility] Patient Name: Dayanis Bergquist Procedure Date: 08/08/2020 1:03 PM MRN: [id number] Account #: [id number] Date of Birth: Mar 23, 1970 Admit Type: Outpatient Age: 50 Room: [facility] ENDO ROOM 1 Gender: Female Note Status: Finalized Procedure:             Colonoscopy Indications:           Periumbilical abdominal pain, Change in bowel habits Providers:             Andrey Farmer MD, MD Referring MD:          Bethena Roys. Sowles, MD (Referring MD) Medicines:             Monitored Anesthesia Care Complications:         No immediate complications. Procedure:             Pre-Anesthesia Assessment:                        - Prior to the procedure, a History and Physical was                         performed, and patient medications and allergies were                         reviewed. The patient is competent. The risks and                         benefits of the procedure and the sedation options and                         risks were discussed with the patient. All questions                         were answered and informed consent was obtained.                         Patient identification and proposed procedure were                         verified by the physician, the nurse, the anesthetist                         and the technician in the endoscopy suite. Mental                         Status Examination: alert and oriented. Airway                         Examination: normal oropharyngeal airway and neck                         mobility. Respiratory Examination: clear to                         auscultation. CV Examination: normal. Prophylactic                         Antibiotics: The patient does not require prophylactic  antibiotics. Prior Anticoagulants: The patient has                         taken no previous anticoagulant or antiplatelet                         agents. ASA Grade Assessment: II - A patient with mild                          systemic disease. After reviewing the risks and                         benefits, the patient was deemed in satisfactory                         condition to undergo the procedure. The anesthesia                         plan was to use monitored anesthesia care (MAC).                         Immediately prior to administration of medications,                         the patient was re-assessed for adequacy to receive                         sedatives. The heart rate, respiratory rate, oxygen                         saturations, blood pressure, adequacy of pulmonary                         ventilation, and response to care were monitored                         throughout the procedure. The physical status of the                         patient was re-assessed after the procedure.                        After obtaining informed consent, the colonoscope was                         passed under direct vision. Throughout the procedure,                         the patient's blood pressure, pulse, and oxygen                         saturations were monitored continuously. The                         Colonoscope was introduced through the anus and                         advanced to the the terminal ileum. The colonoscopy  was performed without difficulty. The patient                         tolerated the procedure well. The quality of the bowel                         preparation was good. Findings:      The perianal and digital rectal examinations were normal.      The terminal ileum appeared normal.      The entire examined colon appeared normal on direct and retroflexion       views. Impression:            - The examined portion of the ileum was normal.                        - The entire examined colon is normal on direct and                         retroflexion views.                        - No specimens collected. Recommendation:        -  Discharge patient to home.                        - Resume previous diet.                        - Continue present medications.                        - Repeat colonoscopy in 10 years for screening                         purposes.                        - Return to referring physician as previously                         scheduled. Procedure Code(s):     --- Professional ---                        [phone], Colonoscopy, flexible; diagnostic, including                         collection of specimen(s) by brushing or washing, when                         performed (separate procedure) Diagnosis Code(s):     --- Professional ---                        R15.40, Periumbilical pain                        R19.4, Change in bowel habit CPT copyright 2019 American Medical Association. All rights reserved. The codes documented in this report are preliminary and upon coder review may  be revised to meet current compliance requirements. Andrey Farmer MD, MD 08/08/2020 1:37:24 PM Number of Addenda: 0 Note Initiated  On: 08/08/2020 1:03 PM Scope Withdrawal Time: 0 hours 13 minutes 5 seconds  Total Procedure Duration: 0 hours 22 minutes 18 seconds  Estimated Blood Loss:  Estimated blood loss: none.      [facility]

## 2020-08-08 NOTE — Anesthesia Procedure Notes (Signed)
Date/Time: 08/08/2020 1:14 PM Performed by: Tonia Ghent Pre-anesthesia Checklist: Patient identified, Emergency Drugs available, Suction available, Patient being monitored and Timeout performed Patient Re-evaluated:Patient Re-evaluated prior to induction Oxygen Delivery Method: Non-rebreather mask Preoxygenation: Pre-oxygenation with 100% oxygen Induction Type: IV induction Placement Confirmation: positive ETCO2

## 2020-08-08 NOTE — H&P (Signed)
Outpatient short stay form Pre-procedure 08/08/2020 12:24 PM Merlyn Lot MD, MPH  Primary Physician: Dr. Carlynn Purl  Reason for visit:  Abdominal pain  History of present illness:   50 y/o lady with history of POTS, anxiety, and hld here for colonoscopy due to chronic abdominal pain. Had unremarkable colonoscopy in 2018 but TI not intubated. Hx of cholecystectomy and hysterectomy. No blood thinners. No family history of GI malignancies.    Current Facility-Administered Medications:    0.9 %  sodium chloride infusion, , Intravenous, Continuous, Sennie Borden, Rossie Muskrat, MD, Last Rate: 20 mL/hr at 08/08/20 1219, 20 mL/hr at 08/08/20 1219  Medications Prior to Admission  Medication Sig Dispense Refill Last Dose   albuterol (VENTOLIN HFA) 108 (90 Base) MCG/ACT inhaler Inhale 2 puffs into the lungs every 6 (six) hours as needed for wheezing or shortness of breath. 8 g 2 08/07/2020   ALPRAZolam (XANAX XR) 1 MG 24 hr tablet Take 1 tablet (1 mg total) by mouth every morning. 90 tablet 0 08/07/2020   Ascorbic Acid (VITAMIN C) 1000 MG tablet Take 1,000 mg by mouth daily.   08/07/2020   aspirin EC 81 MG tablet Take 81 mg by mouth daily.   Past Week   atorvastatin (LIPITOR) 80 MG tablet Take 1 tablet (80 mg total) by mouth daily. 90 tablet 1 08/07/2020   cyclobenzaprine (FLEXERIL) 10 MG tablet Take 1 tablet (10 mg total) by mouth at bedtime. 90 tablet 1 08/07/2020   digoxin (LANOXIN) 0.125 MG tablet Take 1 tablet (0.125 mg total) by mouth daily. 30 tablet 5 08/08/2020 at 0800   fluticasone furoate-vilanterol (BREO ELLIPTA) 100-25 MCG/INH AEPB Inhale 1 puff into the lungs daily. 28 each 0 08/07/2020   fluticasone furoate-vilanterol (BREO ELLIPTA) 100-25 MCG/INH AEPB Inhale 1 puff into the lungs daily. 30 each 6 08/07/2020   icosapent Ethyl (VASCEPA) 1 g capsule TAKE 1 CAPSULE BY MOUTH EVERY MORNING, AT NOON, AND AT BEDTIME 270 capsule 1 08/07/2020   lamoTRIgine (LAMICTAL) 25 MG tablet Take 25 mg by mouth 2 (two)  times daily.   08/07/2020   midodrine (PROAMATINE) 5 MG tablet Take 1 tablet (5 mg total) by mouth 3 (three) times daily with meals. 90 tablet 3 08/07/2020   pantoprazole (PROTONIX) 40 MG tablet Take 1 tablet (40 mg total) by mouth every morning. 90 tablet 1 08/08/2020 at 0800   PARoxetine (PAXIL) 20 MG tablet Take 3 tablets (60 mg total) by mouth daily. 270 tablet 1 08/07/2020   UBRELVY 100 MG TABS Take 100 mg by mouth daily as needed (migraine).    08/07/2020   Vitamin D, Ergocalciferol, (DRISDOL) 1.25 MG (50000 UNIT) CAPS capsule Take 1 capsule (50,000 Units total) by mouth every 7 (seven) days. 12 capsule 1 08/07/2020   zinc gluconate 50 MG tablet Take 50 mg by mouth daily.   08/07/2020   zolpidem (AMBIEN CR) 12.5 MG CR tablet Take 1 tablet (12.5 mg total) by mouth at bedtime as needed for sleep. 90 tablet 0 08/07/2020     Allergies  Allergen Reactions   Azithromycin Diarrhea   Penicillins Hives and Swelling    Has patient had a PCN reaction causing immediate rash, facial/tongue/throat swelling, SOB or lightheadedness with hypotension: Yes Has patient had a PCN reaction causing severe rash involving mucus membranes or skin necrosis: No Has patient had a PCN reaction that required hospitalization No Has patient had a PCN reaction occurring within the last 10 years: No If all of the above answers are "NO", then  may proceed with Cephalosporin use.     Past Medical History:  Diagnosis Date   Anemia    Anxiety    Depression    Dyspnea    SINCE STROKE   GERD (gastroesophageal reflux disease)    Hyperlipidemia    Hypertension    IBS (irritable bowel syndrome)    Migraine    Previous cesarean delivery, delivered, with or without mention of antepartum condition 01/19/2012   Psychogenic nonepileptic seizure    hx/notes 12/19/2016-LAST SEIZURE IN 2019-ON NO MEDS AS OF 05-24-19   Restless leg syndrome    Sleep apnea    USES CPAP   Stroke (HCC) 12/18/2016   Acute arterial ischemic stroke,  multifocal, posterior circulation /notes 12/19/2016-LEFT SIDED WEAKNESS, MEMORY FATIGUE AND TROUBLE FINDING HER WORDS [facility]    Review of systems:  Otherwise negative.    Physical Exam  Gen: Alert, oriented. Appears stated age.  HEENT: PERRLA. Lungs: No respiratory distress CV: RRR Abd: soft, benign, no masses Ext: No edema    Planned procedures: Proceed with colonoscopy. The patient understands the nature of the planned procedure, indications, risks, alternatives and potential complications including but not limited to bleeding, infection, perforation, damage to internal organs and possible oversedation/side effects from anesthesia. The patient agrees and gives consent to proceed.  Please refer to procedure notes for findings, recommendations and patient disposition/instructions.     Merlyn Lot MD, MPH Gastroenterology 08/08/2020  12:24 PM

## 2020-08-09 ENCOUNTER — Encounter: Payer: Self-pay | Admitting: Gastroenterology

## 2020-08-15 ENCOUNTER — Telehealth: Payer: Self-pay

## 2020-08-15 NOTE — Telephone Encounter (Signed)
Lm to request that she bring SD card to OV.

## 2020-08-16 ENCOUNTER — Ambulatory Visit: Payer: BC Managed Care – PPO | Admitting: Primary Care

## 2020-08-16 NOTE — Telephone Encounter (Signed)
Lm x2 for patient. Will.  Will close encounter per office protocol.

## 2020-08-17 DIAGNOSIS — J1282 Pneumonia due to coronavirus disease 2019: Secondary | ICD-10-CM | POA: Diagnosis not present

## 2020-08-17 DIAGNOSIS — U071 COVID-19: Secondary | ICD-10-CM | POA: Diagnosis not present

## 2020-08-17 DIAGNOSIS — J9601 Acute respiratory failure with hypoxia: Secondary | ICD-10-CM | POA: Diagnosis not present

## 2020-08-28 ENCOUNTER — Encounter: Payer: Self-pay | Admitting: Cardiology

## 2020-08-28 ENCOUNTER — Ambulatory Visit: Payer: BC Managed Care – PPO | Admitting: Cardiology

## 2020-08-28 ENCOUNTER — Other Ambulatory Visit: Payer: Self-pay

## 2020-08-28 VITALS — BP 104/74 | HR 97 | Ht 68.0 in | Wt 202.0 lb

## 2020-08-28 DIAGNOSIS — R000 Tachycardia, unspecified: Secondary | ICD-10-CM | POA: Diagnosis not present

## 2020-08-28 DIAGNOSIS — I639 Cerebral infarction, unspecified: Secondary | ICD-10-CM | POA: Diagnosis not present

## 2020-08-28 DIAGNOSIS — E782 Mixed hyperlipidemia: Secondary | ICD-10-CM

## 2020-08-28 DIAGNOSIS — Z79899 Other long term (current) drug therapy: Secondary | ICD-10-CM

## 2020-08-28 DIAGNOSIS — I951 Orthostatic hypotension: Secondary | ICD-10-CM | POA: Diagnosis not present

## 2020-08-28 MED ORDER — DIGOXIN 250 MCG PO TABS: 0.2500 mg | ORAL_TABLET | Freq: Every day | ORAL | 1 refills | Status: DC

## 2020-08-28 NOTE — Patient Instructions (Signed)
Medication Instructions:  Your physician has recommended you make the following change in your medication:   INCREASE Digoxin to 0.25mg  daily - You may take 2 tablets of current dose (0.125mg ) until new Rx is picked up  *If you need a refill on your cardiac medications before your next appointment, please call your pharmacy*   Lab Work:  -  Your physician recommends that you return for FASTING lab work at your convenience: Digoxin level / Lipid panel  -  Please go to the [facility]. You will check in at the front desk to the right as you walk into the atrium. Valet Parking is offered if needed.  - No appointment needed. You may go any day between 7 am and 6 pm.    Testing/Procedures:  None ordered   Follow-Up: At [facility], you and your health needs are our priority.  As part of our continuing mission to provide you with exceptional heart care, we have created designated Provider Care Teams.  These Care Teams include your primary Cardiologist (physician) and Advanced Practice Providers (APPs -  Physician Assistants and Nurse Practitioners) who all work together to provide you with the care you need, when you need it.  We recommend signing up for the patient portal called "MyChart".  Sign up information is provided on this After Visit Summary.  MyChart is used to connect with patients for Virtual Visits (Telemedicine).  Patients are able to view lab/test results, encounter notes, upcoming appointments, etc.  Non-urgent messages can be sent to your provider as well.   To learn more about what you can do with MyChart, go to [email or web address].    Your next appointment:   3 month(s)  The format for your next appointment:   In Person  Provider:   You may see Debbe Odea, MD or one of the following Advanced Practice Providers on your designated Care Team:   Nicolasa Ducking, NP Eula Listen, PA-C Marisue Ivan, PA-C Cadence [place], [place]

## 2020-08-28 NOTE — Progress Notes (Signed)
Cardiology Office Note:    Date:  08/28/2020   ID:  Alicia Guadeloupeorothy Van Note Velna HatchetKuntz, DOB 10-May-1970, MRN [id number]  PCP:  [name], [name], MD  [facility] Cardiologist:  Debbe OdeaBrian Agbor-Etang, MD  [facility] Electrophysiologist:  None   Referring MD: [name], [name], MD   Chief Complaint  Patient presents with   Other    3 month follow up. Meds reviewed verbally with patient.      History of Present Illness:    Alicia [name] is a 50 y.o. female with a hx of anxiety, hyperlipidemia, orthostasis, inappropriate sinus tach, CVA, TIA who presents for follow-up.    Last seen for orthostasis and inappropriate sinus tachycardia.  Work-up for prior TIA/cryptogenic stroke was unrevealing from a cardiac perspective.  She was started on digoxin to help with heart rate control.  Insurance will not approve ivabradine.  Started on midodrine to help with blood pressures.  She states her symptoms have improved, although she occasionally gets dizzy but not as bad as before.  Heart rates have stayed in the 100s to 115.  Had an ILR placed with interrogation showing no evidence of A. fib or flutter.  Recommended repeat fasting lipid profile, patient forgot to have drawn.   Prior notes Patient first diagnosed with strokes in 2018.  At that time, she had dizziness, nausea, left-sided weakness.  Also has a history of seizures.  Had another episode of CVA in 2019 with left-sided arm and leg weakness.  Loop recorder was placed in 2018, last checked a month ago without any evidence of A. fib or flutter.  Also states having orthostasis for the past couple of years, and tachycardia.  Was placed on Coreg to help with heart rate control, but this made her orthostasis worse.  Coreg was stopped, her blood pressures improved, but she still gets dizzy when standing for long.  Sometimes, associated with visual issues, and syncope.  Patient was diagnosed with COVID-19 pneumonia on 01/2020 after presenting to the  hospital with shortness of breath.  Chest x-ray showed multifocal pneumonia.  Managed with IV infusion, remdesivir.  Echocardiogram (02/2020 showed normal systolic function, EF 60-65, impaired relaxation.  Past Medical History:  Diagnosis Date   Anemia    Anxiety    Depression    Dyspnea    SINCE STROKE   GERD (gastroesophageal reflux disease)    Hyperlipidemia    Hypertension    IBS (irritable bowel syndrome)    Migraine    Previous cesarean delivery, delivered, with or without mention of antepartum condition 01/19/2012   Psychogenic nonepileptic seizure    hx/notes 12/19/2016-LAST SEIZURE IN 2019-ON NO MEDS AS OF 05-24-19   Restless leg syndrome    Sleep apnea    USES CPAP   Stroke (HCC) 12/18/2016   Acute arterial ischemic stroke, multifocal, posterior circulation /notes 12/19/2016-LEFT SIDED WEAKNESS, MEMORY FATIGUE AND TROUBLE FINDING HER WORDS [facility]    Past Surgical History:  Procedure Laterality Date   ABDOMINOPLASTY  Feb. 2015   CESAREAN SECTION  2011   CHOLECYSTECTOMY N/A 05/31/2019   Procedure: LAPAROSCOPIC CHOLECYSTECTOMY WITH INTRAOPERATIVE CHOLANGIOGRAM;  Surgeon: Earline MayotteByrnett, Jeffrey W, MD;  Location: [facility] ORS;  Service: General;  Laterality: N/A;   COLONOSCOPY WITH PROPOFOL N/A 11/26/2016   Procedure: COLONOSCOPY WITH PROPOFOL;  Surgeon: Wyline MoodAnna, Kiran, MD;  [facility];  Service: Gastroenterology;  Laterality: N/A;   COLONOSCOPY WITH PROPOFOL N/A 08/08/2020   Procedure: COLONOSCOPY WITH PROPOFOL;  Surgeon: Regis BillLocklear, Cameron T, MD;  Location: [facility] ENDOSCOPY;  Service: Endoscopy;  Laterality: N/A;   CYSTOSCOPY  02/02/2015   Procedure: CYSTOSCOPY;  Surgeon: Nadara Mustard, MD;  Location: [facility] ORS;  Service: Gynecology;;   DILATION AND CURETTAGE OF UTERUS  2003, 2005, 2006-06-10   ESOPHAGOGASTRODUODENOSCOPY N/A 05/31/2019   Procedure: ESOPHAGOGASTRODUODENOSCOPY (EGD);  Surgeon: Earline Mayotte, MD;  Location: [facility] ORS;  Service: General;  Laterality: N/A;  in the O.R.    ESOPHAGOGASTRODUODENOSCOPY (EGD) WITH PROPOFOL N/A 11/26/2016   Procedure: ESOPHAGOGASTRODUODENOSCOPY (EGD) WITH PROPOFOL;  Surgeon: Wyline Mood, MD;  Location[facility] ENDOSCOPY;  Service: Gastroenterology;  Laterality: N/A;   HERNIA REPAIR  1999   Umbilical   KNEE ARTHROSCOPY Right Jun 09, 2004   LAPAROSCOPIC BILATERAL SALPINGECTOMY Bilateral 02/02/2015   Procedure: LAPAROSCOPIC BILATERAL SALPINGECTOMY;  Surgeon: Nadara Mustard, MD;  Location: [facility] ORS;  Service: Gynecology;  Laterality: Bilateral;   LAPAROSCOPIC HYSTERECTOMY N/A 02/02/2015   Procedure: HYSTERECTOMY TOTAL LAPAROSCOPIC;  Surgeon: Nadara Mustard, MD;  Location: [facility] ORS;  Service: Gynecology;  Laterality: N/A;   LOOP RECORDER INSERTION N/A 12/20/2016   Procedure: LOOP RECORDER INSERTION;  Surgeon: Regan Lemming, MD;  Location: [facility] INVASIVE CV LAB;  Service: Cardiovascular;  Laterality: N/A;   REDUCTION MAMMAPLASTY Bilateral December 2015   TEE WITHOUT CARDIOVERSION N/A 12/20/2016   Procedure: TRANSESOPHAGEAL ECHOCARDIOGRAM (TEE);  Surgeon: Lars Masson, MD;  Location: [facility] ENDOSCOPY;  Service: Cardiovascular;  Laterality: N/A;   TEE WITHOUT CARDIOVERSION N/A 03/29/2020   Procedure: TRANSESOPHAGEAL ECHOCARDIOGRAM (TEE);  Surgeon: Debbe Odea, MD;  Location: [facility] ORS;  Service: Cardiovascular;  Laterality: N/A;    Current Medications: No outpatient medications have been marked as taking for the 08/28/20 encounter (Office Visit) with Debbe Odea, MD.     Allergies:   Azithromycin and Penicillins   Social History   Socioeconomic History   Marital status: Married    Spouse name: John    Number of children: 4   Years of education: Not on file   Highest education level: Not on file  Occupational History   Occupation: Agricultural engineer: [employer]  Tobacco Use   Smoking status: Never   Smokeless tobacco: Never  Vaping Use   Vaping Use: Never used  Substance and Sexual Activity   Alcohol use: No     Alcohol/week: 0.0 standard drinks   Drug use: No   Sexual activity: Yes    Partners: Male  Other Topics Concern   Not on file  Social History Narrative   First husband died suddenly in 2014/06/10   Remarried 06/07/2016   Social Determinants of Health   Financial Resource Strain: Not on file  Food Insecurity: Not on file  Transportation Needs: Not on file  Physical Activity: Not on file  Stress: Not on file  Social Connections: Not on file     Family History: The patient's family history includes Heart Problems in her father; Hyperlipidemia in her mother; Hypertension in her mother; Prostate cancer in her maternal grandfather; Von Willebrand disease in her maternal uncle.  ROS:   Please see the history of present illness.     All other systems reviewed and are negative.  EKGs/Labs/Other Studies Reviewed:    The following studies were reviewed today:   EKG:  EKG is  ordered today.  EKG shows sinus rhythm, nonspecific ST changes.  Recent Labs: 02/23/2020: B Natriuretic Peptide 19.3 02/24/2020: ALT 46; Magnesium 2.2; Magnesium 2.2 03/16/2020: BUN 13; Creatinine, Ser 1.09; Hemoglobin 12.2; Platelets 254; Potassium 4.0; Sodium 138  Recent Lipid Panel  Component Value Date/Time   CHOL 193 05/04/2019 0929   CHOL 161 09/12/2017 0838   TRIG 305 (H) 05/04/2019 0929   HDL 34 (L) 05/04/2019 0929   HDL 43 09/12/2017 0838   CHOLHDL 5.7 (H) 05/04/2019 0929   VLDL 76 (H) 12/19/2016 0009   LDLCALC 118 (H) 05/04/2019 0929     Risk Assessment/Calculations:      Physical Exam:    VS:  BP 104/74 (BP Location: Left Arm, Patient Position: Sitting, Cuff Size: Normal)   Pulse 97   Ht 5\' 8"  (1.727 m)   Wt 202 lb (91.6 kg)   LMP 01/15/2015 (Exact Date)   SpO2 96%   BMI 30.71 kg/m     Wt Readings from Last 3 Encounters:  08/28/20 202 lb (91.6 kg)  08/08/20 195 lb (88.5 kg)  06/26/20 197 lb 12.8 oz (89.7 kg)     GEN:  Well nourished, well developed in no acute distress HEENT:  Normal NECK: No JVD; No carotid bruits LYMPHATICS: No lymphadenopathy CARDIAC: Tachycardic, no murmurs, rubs, gallops RESPIRATORY:  Clear to auscultation without rales, wheezing or rhonchi  ABDOMEN: Soft, non-tender, non-distended MUSCULOSKELETAL:  No edema; No deformity  SKIN: Warm and dry NEUROLOGIC:  Alert and oriented x 3 PSYCHIATRIC:  Normal affect   ASSESSMENT:    1. Cerebrovascular accident (CVA), unspecified mechanism (HCC)   2. Inappropriate sinus tachycardia   3. Orthostatic hypotension   4. Mixed hyperlipidemia   5. Medication management     PLAN:    In order of problems listed above:  Patient with multiple strokes, TIA in the past.  ILR with no evidence for A. fib or flutter.  TEE with no shunting.  Continue aspirin, Lipitor. Inappropriate sinus tach, she failed management with beta-blockers due to worsening orthostasis.  Ivabradine not covered by insurance.  Heart rates in the 100s to low 1 teens.  Increase digoxin to 0.25 mg daily.  Check dig levels. History of orthostasis, dizziness upon standing from seated position.  Symptoms overall improved.  Continue midodrine 5 mg 3 times daily. Hyperlipidemia, hypertriglyceridemia, continue Vascepa and Lipitor.  Repeat fasting lipid profile  Follow-up in 3 months.     Medication Adjustments/Labs and Tests Ordered: Current medicines are reviewed at length with the patient today.  Concerns regarding medicines are outlined above.  Orders Placed This Encounter  Procedures   Lipid Profile   Digoxin level   EKG 12-Lead    Meds ordered this encounter  Medications   digoxin (LANOXIN) 0.25 MG tablet    Sig: Take 1 tablet (0.25 mg total) by mouth daily.    Dispense:  90 tablet    Refill:  1     Patient Instructions  Medication Instructions:  Your physician has recommended you make the following change in your medication:   INCREASE Digoxin to 0.25mg  daily - You may take 2 tablets of current dose (0.125mg ) until new  Rx is picked up  *If you need a refill on your cardiac medications before your next appointment, please call your pharmacy*   Lab Work:  -  Your physician recommends that you return for FASTING lab work at your convenience: Digoxin level / Lipid panel  -  Please go to the [facility]. You will check in at the front desk to the right as you walk into the atrium. Valet Parking is offered if needed.  - No appointment needed. You may go any day between 7 am and 6 pm.    Testing/Procedures:  None ordered   Follow-Up: At [facility], you and your health needs are our priority.  As part of our continuing mission to provide you with exceptional heart care, we have created designated Provider Care Teams.  These Care Teams include your primary Cardiologist (physician) and Advanced Practice Providers (APPs -  Physician Assistants and Nurse Practitioners) who all work together to provide you with the care you need, when you need it.  We recommend signing up for the patient portal called "MyChart".  Sign up information is provided on this After Visit Summary.  MyChart is used to connect with patients for Virtual Visits (Telemedicine).  Patients are able to view lab/test results, encounter notes, upcoming appointments, etc.  Non-urgent messages can be sent to your provider as well.   To learn more about what you can do with MyChart, go to [email or web address].    Your next appointment:   3 month(s)  The format for your next appointment:   In Person  Provider:   You may see Debbe Odea, MD or one of the following Advanced Practice Providers on your designated Care Team:   Nicolasa Ducking, NP Eula Listen, PA-C Marisue Ivan, PA-C Cadence [place], [place]     Signed, Debbe Odea, MD  08/28/2020 12:25 PM    [facility]

## 2020-08-29 ENCOUNTER — Other Ambulatory Visit: Payer: Self-pay | Admitting: Family Medicine

## 2020-08-29 DIAGNOSIS — F330 Major depressive disorder, recurrent, mild: Secondary | ICD-10-CM

## 2020-08-29 DIAGNOSIS — F411 Generalized anxiety disorder: Secondary | ICD-10-CM

## 2020-08-30 ENCOUNTER — Other Ambulatory Visit: Payer: Self-pay | Admitting: Family Medicine

## 2020-08-30 ENCOUNTER — Encounter: Payer: Self-pay | Admitting: Family Medicine

## 2020-08-30 ENCOUNTER — Other Ambulatory Visit: Payer: Self-pay

## 2020-08-30 MED ORDER — PAROXETINE HCL 20 MG PO TABS: 20.0000 mg | ORAL_TABLET | Freq: Every day | ORAL | 0 refills | Status: DC

## 2020-08-30 MED ORDER — PAROXETINE HCL 40 MG PO TABS: 40.0000 mg | ORAL_TABLET | ORAL | 0 refills | Status: DC

## 2020-08-31 ENCOUNTER — Telehealth: Payer: Self-pay

## 2020-08-31 NOTE — Telephone Encounter (Signed)
ILR has reached RRT as of 08/30/20. Disucssed options of explant or leave in and states she is not sure she will think about it and call back.   Return kit requested to be sent to patients home.

## 2020-09-04 ENCOUNTER — Ambulatory Visit: Payer: BC Managed Care – PPO | Admitting: Family Medicine

## 2020-09-04 ENCOUNTER — Other Ambulatory Visit: Payer: Self-pay

## 2020-09-04 ENCOUNTER — Encounter: Payer: Self-pay | Admitting: Family Medicine

## 2020-09-04 VITALS — BP 112/66 | HR 98 | Temp 98.2°F | Resp 16 | Ht 68.0 in | Wt 200.0 lb

## 2020-09-04 DIAGNOSIS — J683 Other acute and subacute respiratory conditions due to chemicals, gases, fumes and vapors: Secondary | ICD-10-CM

## 2020-09-04 DIAGNOSIS — G894 Chronic pain syndrome: Secondary | ICD-10-CM

## 2020-09-04 DIAGNOSIS — G9332 Myalgic encephalomyelitis/chronic fatigue syndrome: Secondary | ICD-10-CM

## 2020-09-04 DIAGNOSIS — I69354 Hemiplegia and hemiparesis following cerebral infarction affecting left non-dominant side: Secondary | ICD-10-CM

## 2020-09-04 DIAGNOSIS — F5104 Psychophysiologic insomnia: Secondary | ICD-10-CM

## 2020-09-04 DIAGNOSIS — R5382 Chronic fatigue, unspecified: Secondary | ICD-10-CM

## 2020-09-04 DIAGNOSIS — U099 Post covid-19 condition, unspecified: Secondary | ICD-10-CM

## 2020-09-04 DIAGNOSIS — G43009 Migraine without aura, not intractable, without status migrainosus: Secondary | ICD-10-CM | POA: Diagnosis not present

## 2020-09-04 DIAGNOSIS — E559 Vitamin D deficiency, unspecified: Secondary | ICD-10-CM

## 2020-09-04 DIAGNOSIS — M310 Hypersensitivity angiitis: Secondary | ICD-10-CM

## 2020-09-04 DIAGNOSIS — F411 Generalized anxiety disorder: Secondary | ICD-10-CM

## 2020-09-04 DIAGNOSIS — E785 Hyperlipidemia, unspecified: Secondary | ICD-10-CM

## 2020-09-04 DIAGNOSIS — K219 Gastro-esophageal reflux disease without esophagitis: Secondary | ICD-10-CM

## 2020-09-04 DIAGNOSIS — R569 Unspecified convulsions: Secondary | ICD-10-CM

## 2020-09-04 DIAGNOSIS — Z79899 Other long term (current) drug therapy: Secondary | ICD-10-CM

## 2020-09-04 DIAGNOSIS — F330 Major depressive disorder, recurrent, mild: Secondary | ICD-10-CM

## 2020-09-04 DIAGNOSIS — G4733 Obstructive sleep apnea (adult) (pediatric): Secondary | ICD-10-CM | POA: Diagnosis not present

## 2020-09-04 DIAGNOSIS — R7303 Prediabetes: Secondary | ICD-10-CM

## 2020-09-04 MED ORDER — PAROXETINE HCL ER 37.5 MG PO TB24
37.5000 mg | ORAL_TABLET | Freq: Every day | ORAL | 1 refills | Status: DC
Start: 2020-09-04 — End: 2020-12-06

## 2020-09-04 MED ORDER — ALPRAZOLAM ER 1 MG PO TB24: 1.0000 mg | ORAL_TABLET | ORAL | 0 refills | Status: DC

## 2020-09-04 MED ORDER — ZOLPIDEM TARTRATE ER 12.5 MG PO TBCR
12.5000 mg | EXTENDED_RELEASE_TABLET | Freq: Every evening | ORAL | 0 refills | Status: DC | PRN
Start: 2020-09-04 — End: 2020-12-13
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
## 2020-09-05 ENCOUNTER — Other Ambulatory Visit: Payer: Self-pay | Admitting: Family Medicine

## 2020-09-11 ENCOUNTER — Other Ambulatory Visit: Payer: Self-pay | Admitting: Family Medicine

## 2020-09-11 ENCOUNTER — Encounter: Payer: Self-pay | Admitting: Family Medicine

## 2020-09-11 NOTE — Telephone Encounter (Signed)
Pt has an appt on 12/13/20

## 2020-09-11 NOTE — Telephone Encounter (Signed)
Requested medication (s) are due for refill today: no  Requested medication (s) are on the active medication list: yes   Last refill:  09/06/2020  Future visit scheduled:yes  Notes to clinic:  this refill cannot be delegated    Requested Prescriptions  Pending Prescriptions Disp Refills   cyclobenzaprine (FLEXERIL) 10 MG tablet [Pharmacy Med Name: CYCLOBENZAPRINE 10MG  TABLETS] 90 tablet 1    Sig: TAKE 1 TABLET(10 MG) BY MOUTH AT BEDTIME      Not Delegated - Analgesics:  Muscle Relaxants Failed - 09/11/2020  1:51 PM      Failed - This refill cannot be delegated      Passed - Valid encounter within last 6 months    Recent Outpatient Visits           1 week ago Mild episode of recurrent major depressive disorder [facility])   [facility], MD   2 months ago Seizure-like activity [facility])   [facility], MD   5 months ago GAD (generalized anxiety disorder)   [facility], MD   6 months ago Hospital discharge follow-up   [facility], MD   7 months ago Acute midline thoracic back pain   [facility], MD       Future Appointments             In 3 months Jamelle Haring, Carlynn Purl, MD [facility], [facility]

## 2020-09-17 DIAGNOSIS — U071 COVID-19: Secondary | ICD-10-CM | POA: Diagnosis not present

## 2020-09-17 DIAGNOSIS — J9601 Acute respiratory failure with hypoxia: Secondary | ICD-10-CM | POA: Diagnosis not present

## 2020-09-17 DIAGNOSIS — J1282 Pneumonia due to coronavirus disease 2019: Secondary | ICD-10-CM | POA: Diagnosis not present

## 2020-09-26 DIAGNOSIS — R3121 Asymptomatic microscopic hematuria: Secondary | ICD-10-CM | POA: Diagnosis not present

## 2020-09-26 DIAGNOSIS — R7982 Elevated C-reactive protein (CRP): Secondary | ICD-10-CM | POA: Diagnosis not present

## 2020-09-27 LAB — URINALYSIS, COMPLETE
Bilirubin, UA: NEGATIVE
Leukocytes,UA: NEGATIVE
Nitrite, UA: NEGATIVE
RBC, UA: NEGATIVE
Specific Gravity, UA: >=1.03 — AB (ref 1.005–1.030)
Urobilinogen, Ur: 1 mg/dL (ref 0.2–1.0)
pH, UA: 5.5 (ref 5.0–7.5)

## 2020-09-27 LAB — C-REACTIVE PROTEIN: CRP: 6 mg/L (ref 0–10)

## 2020-09-27 LAB — MICROSCOPIC EXAMINATION
Casts: NONE SEEN /LPF
WBC, UA: NONE SEEN /HPF (ref 0–5)

## 2020-09-28 LAB — URINE CULTURE

## 2020-10-18 DIAGNOSIS — J1282 Pneumonia due to coronavirus disease 2019: Secondary | ICD-10-CM | POA: Diagnosis not present

## 2020-10-18 DIAGNOSIS — U071 COVID-19: Secondary | ICD-10-CM | POA: Diagnosis not present

## 2020-10-18 DIAGNOSIS — J9601 Acute respiratory failure with hypoxia: Secondary | ICD-10-CM | POA: Diagnosis not present

## 2020-10-19 ENCOUNTER — Other Ambulatory Visit: Payer: Self-pay | Admitting: Family Medicine

## 2020-10-19 ENCOUNTER — Encounter: Payer: Self-pay | Admitting: Family Medicine

## 2020-10-19 DIAGNOSIS — E041 Nontoxic single thyroid nodule: Secondary | ICD-10-CM

## 2020-10-19 DIAGNOSIS — R7303 Prediabetes: Secondary | ICD-10-CM

## 2020-10-19 DIAGNOSIS — E785 Hyperlipidemia, unspecified: Secondary | ICD-10-CM

## 2020-10-19 DIAGNOSIS — Z79899 Other long term (current) drug therapy: Secondary | ICD-10-CM

## 2020-10-30 DIAGNOSIS — R7303 Prediabetes: Secondary | ICD-10-CM | POA: Diagnosis not present

## 2020-10-30 DIAGNOSIS — E041 Nontoxic single thyroid nodule: Secondary | ICD-10-CM | POA: Diagnosis not present

## 2020-10-30 DIAGNOSIS — Z79899 Other long term (current) drug therapy: Secondary | ICD-10-CM | POA: Diagnosis not present

## 2020-10-30 DIAGNOSIS — E785 Hyperlipidemia, unspecified: Secondary | ICD-10-CM | POA: Diagnosis not present

## 2020-10-31 LAB — CBC WITH DIFFERENTIAL/PLATELET
Basophils Absolute: 0.1 10*3/uL (ref 0.0–0.2)
Basos: 0 %
EOS (ABSOLUTE): 0.1 10*3/uL (ref 0.0–0.4)
Eos: 1 %
Hematocrit: 46.3 % (ref 34.0–46.6)
Hemoglobin: 15 g/dL (ref 11.1–15.9)
Immature Grans (Abs): 0.1 10*3/uL (ref 0.0–0.1)
Immature Granulocytes: 1 %
Lymphocytes Absolute: 3.2 10*3/uL — ABNORMAL HIGH (ref 0.7–3.1)
Lymphs: 22 %
MCH: 29.3 pg (ref 26.6–33.0)
MCHC: 32.4 g/dL (ref 31.5–35.7)
MCV: 90 fL (ref 79–97)
Monocytes Absolute: 0.5 10*3/uL (ref 0.1–0.9)
Monocytes: 4 %
Neutrophils Absolute: 10.4 10*3/uL — ABNORMAL HIGH (ref 1.4–7.0)
Neutrophils: 72 %
Platelets: 396 10*3/uL (ref 150–450)
RBC: 5.12 x10E6/uL (ref 3.77–5.28)
RDW: 13 % (ref 11.7–15.4)
WBC: 14.4 10*3/uL — ABNORMAL HIGH (ref 3.4–10.8)

## 2020-10-31 LAB — COMPREHENSIVE METABOLIC PANEL
ALT: 25 IU/L (ref 0–32)
AST: 17 IU/L (ref 0–40)
Albumin/Globulin Ratio: 2.3 — ABNORMAL HIGH (ref 1.2–2.2)
Albumin: 4.5 g/dL (ref 3.8–4.8)
Alkaline Phosphatase: 109 IU/L (ref 44–121)
BUN/Creatinine Ratio: 20 (ref 9–23)
BUN: 20 mg/dL (ref 6–24)
Bilirubin Total: 0.4 mg/dL (ref 0.0–1.2)
CO2: 25 mmol/L (ref 20–29)
Calcium: 9.4 mg/dL (ref 8.7–10.2)
Chloride: 99 mmol/L (ref 96–106)
Creatinine, Ser: 1.01 mg/dL — ABNORMAL HIGH (ref 0.57–1.00)
Globulin, Total: 2 g/dL (ref 1.5–4.5)
Glucose: 132 mg/dL — ABNORMAL HIGH (ref 65–99)
Potassium: 4.4 mmol/L (ref 3.5–5.2)
Sodium: 145 mmol/L — ABNORMAL HIGH (ref 134–144)
Total Protein: 6.5 g/dL (ref 6.0–8.5)
eGFR: 68 mL/min/{1.73_m2} (ref >59)

## 2020-10-31 LAB — TSH: TSH: 3.4 u[IU]/mL (ref 0.450–4.500)

## 2020-10-31 LAB — LIPID PANEL
Chol/HDL Ratio: 6.4 ratio — ABNORMAL HIGH (ref 0.0–4.4)
Cholesterol, Total: 218 mg/dL — ABNORMAL HIGH (ref 100–199)
HDL: 34 mg/dL — ABNORMAL LOW (ref >39)
LDL Chol Calc (NIH): 77 mg/dL (ref 0–99)
Triglycerides: 673 mg/dL (ref 0–149)
VLDL Cholesterol Cal: 107 mg/dL — ABNORMAL HIGH (ref 5–40)

## 2020-10-31 LAB — HEMOGLOBIN A1C
Est. average glucose Bld gHb Est-mCnc: 137 mg/dL
Hgb A1c MFr Bld: 6.4 % — ABNORMAL HIGH (ref 4.8–5.6)

## 2020-11-16 ENCOUNTER — Telehealth: Payer: Self-pay

## 2020-11-16 MED ORDER — MIDODRINE HCL 5 MG PO TABS: 5.0000 mg | ORAL_TABLET | Freq: Three times a day (TID) | ORAL | 1 refills | Status: DC

## 2020-11-16 NOTE — Telephone Encounter (Signed)
Received a refill request from patient through MyChart and sent her the following response:  Hi Alicia [name],   I refilled your Midodrine for 2 months. Please call our office [phone] at your earliest convenience for your appointment to be rescheduled as we will not be able to refill again without an appointment.   I hope you have a good trip.   Sincerely,   Anushri Casalino RN

## 2020-11-17 ENCOUNTER — Other Ambulatory Visit: Payer: Self-pay | Admitting: *Deleted

## 2020-11-27 DIAGNOSIS — H9193 Unspecified hearing loss, bilateral: Secondary | ICD-10-CM | POA: Diagnosis not present

## 2020-11-27 DIAGNOSIS — R29818 Other symptoms and signs involving the nervous system: Secondary | ICD-10-CM | POA: Diagnosis not present

## 2020-11-27 DIAGNOSIS — R55 Syncope and collapse: Secondary | ICD-10-CM | POA: Diagnosis not present

## 2020-11-27 DIAGNOSIS — I63442 Cerebral infarction due to embolism of left cerebellar artery: Secondary | ICD-10-CM | POA: Diagnosis not present

## 2020-12-04 DIAGNOSIS — I6930 Unspecified sequelae of cerebral infarction: Secondary | ICD-10-CM | POA: Diagnosis not present

## 2020-12-05 ENCOUNTER — Other Ambulatory Visit: Payer: Self-pay | Admitting: Family Medicine

## 2020-12-05 ENCOUNTER — Encounter: Payer: Self-pay | Admitting: Family Medicine

## 2020-12-06 ENCOUNTER — Other Ambulatory Visit: Payer: Self-pay | Admitting: *Deleted

## 2020-12-06 ENCOUNTER — Other Ambulatory Visit: Payer: Self-pay | Admitting: Family Medicine

## 2020-12-06 MED ORDER — MIDODRINE HCL 5 MG PO TABS: 5.0000 mg | ORAL_TABLET | Freq: Three times a day (TID) | ORAL | 0 refills | Status: DC

## 2020-12-06 MED ORDER — PAROXETINE HCL ER 25 MG PO TB24: 50.0000 mg | ORAL_TABLET | Freq: Every day | ORAL | 0 refills | Status: DC

## 2020-12-07 ENCOUNTER — Ambulatory Visit: Payer: BC Managed Care – PPO | Admitting: Cardiology

## 2020-12-08 ENCOUNTER — Encounter: Payer: Self-pay | Admitting: Cardiology

## 2020-12-11 DIAGNOSIS — I6930 Unspecified sequelae of cerebral infarction: Secondary | ICD-10-CM | POA: Diagnosis not present

## 2020-12-13 ENCOUNTER — Other Ambulatory Visit: Payer: Self-pay

## 2020-12-13 ENCOUNTER — Ambulatory Visit: Payer: BC Managed Care – PPO | Admitting: Family Medicine

## 2020-12-13 ENCOUNTER — Encounter: Payer: Self-pay | Admitting: Family Medicine

## 2020-12-13 VITALS — BP 112/64 | HR 91 | Temp 97.9°F | Resp 16 | Ht 68.0 in | Wt 196.0 lb

## 2020-12-13 DIAGNOSIS — I69354 Hemiplegia and hemiparesis following cerebral infarction affecting left non-dominant side: Secondary | ICD-10-CM

## 2020-12-13 DIAGNOSIS — G4733 Obstructive sleep apnea (adult) (pediatric): Secondary | ICD-10-CM

## 2020-12-13 DIAGNOSIS — J683 Other acute and subacute respiratory conditions due to chemicals, gases, fumes and vapors: Secondary | ICD-10-CM | POA: Diagnosis not present

## 2020-12-13 DIAGNOSIS — G43009 Migraine without aura, not intractable, without status migrainosus: Secondary | ICD-10-CM

## 2020-12-13 DIAGNOSIS — Z23 Encounter for immunization: Secondary | ICD-10-CM

## 2020-12-13 DIAGNOSIS — F330 Major depressive disorder, recurrent, mild: Secondary | ICD-10-CM | POA: Diagnosis not present

## 2020-12-13 DIAGNOSIS — R7303 Prediabetes: Secondary | ICD-10-CM

## 2020-12-13 DIAGNOSIS — G8929 Other chronic pain: Secondary | ICD-10-CM

## 2020-12-13 DIAGNOSIS — E041 Nontoxic single thyroid nodule: Secondary | ICD-10-CM

## 2020-12-13 DIAGNOSIS — M542 Cervicalgia: Secondary | ICD-10-CM

## 2020-12-13 DIAGNOSIS — F5104 Psychophysiologic insomnia: Secondary | ICD-10-CM

## 2020-12-13 DIAGNOSIS — R569 Unspecified convulsions: Secondary | ICD-10-CM

## 2020-12-13 DIAGNOSIS — F411 Generalized anxiety disorder: Secondary | ICD-10-CM

## 2020-12-13 DIAGNOSIS — E559 Vitamin D deficiency, unspecified: Secondary | ICD-10-CM

## 2020-12-13 DIAGNOSIS — K219 Gastro-esophageal reflux disease without esophagitis: Secondary | ICD-10-CM

## 2020-12-13 DIAGNOSIS — E785 Hyperlipidemia, unspecified: Secondary | ICD-10-CM

## 2020-12-13 DIAGNOSIS — G894 Chronic pain syndrome: Secondary | ICD-10-CM

## 2020-12-13 MED ORDER — ALPRAZOLAM ER 1 MG PO TB24: 1.0000 mg | ORAL_TABLET | ORAL | 0 refills | Status: DC

## 2020-12-13 MED ORDER — VITAMIN D (ERGOCALCIFEROL) 1.25 MG (50000 UNIT) PO CAPS: 50000.0000 [IU] | ORAL_CAPSULE | ORAL | 1 refills | Status: DC

## 2020-12-13 MED ORDER — ZOLPIDEM TARTRATE ER 12.5 MG PO TBCR: 12.5000 mg | EXTENDED_RELEASE_TABLET | Freq: Every evening | ORAL | 0 refills | Status: DC | PRN

## 2020-12-13 MED ORDER — ROSUVASTATIN CALCIUM 40 MG PO TABS: 40.0000 mg | ORAL_TABLET | Freq: Every day | ORAL | 1 refills | Status: DC

## 2020-12-13 MED ORDER — PANTOPRAZOLE SODIUM 40 MG PO TBEC: 40.0000 mg | DELAYED_RELEASE_TABLET | ORAL | 1 refills | Status: DC
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
## 2020-12-31 DIAGNOSIS — I6930 Unspecified sequelae of cerebral infarction: Secondary | ICD-10-CM | POA: Diagnosis not present

## 2021-01-08 ENCOUNTER — Other Ambulatory Visit: Payer: Self-pay | Admitting: Family Medicine

## 2021-01-08 DIAGNOSIS — R000 Tachycardia, unspecified: Secondary | ICD-10-CM | POA: Diagnosis not present

## 2021-01-08 DIAGNOSIS — Z79899 Other long term (current) drug therapy: Secondary | ICD-10-CM | POA: Diagnosis not present

## 2021-01-09 DIAGNOSIS — Z9049 Acquired absence of other specified parts of digestive tract: Secondary | ICD-10-CM | POA: Diagnosis not present

## 2021-01-09 DIAGNOSIS — K862 Cyst of pancreas: Secondary | ICD-10-CM | POA: Diagnosis not present

## 2021-01-16 ENCOUNTER — Encounter: Payer: Self-pay | Admitting: Cardiology

## 2021-01-18 ENCOUNTER — Telehealth: Payer: Self-pay

## 2021-01-18 DIAGNOSIS — I639 Cerebral infarction, unspecified: Secondary | ICD-10-CM

## 2021-01-18 NOTE — Telephone Encounter (Signed)
Received message from patient as pasted below. Entered referral for EP here in [place] and will reply back to patient through MyChart.  Alicia [name]  P C[facility] Triage (supporting Debbe Odea, MD) 2 days ago   I was not able to make my appointment on 10/20.  I need to reschedule that appointment but I also need my loop recorder removed.  It was put in by a doctor in [place].  I will not, under any circumstance, let him near me.  Can you remove it?  The battery is dead and I have returned my machine to the company.

## 2021-01-23 ENCOUNTER — Ambulatory Visit (INDEPENDENT_AMBULATORY_CARE_PROVIDER_SITE_OTHER): Payer: BC Managed Care – PPO | Admitting: Internal Medicine

## 2021-01-23 ENCOUNTER — Encounter: Payer: Self-pay | Admitting: Internal Medicine

## 2021-01-23 ENCOUNTER — Ambulatory Visit (INDEPENDENT_AMBULATORY_CARE_PROVIDER_SITE_OTHER): Payer: BC Managed Care – PPO

## 2021-01-23 ENCOUNTER — Other Ambulatory Visit: Payer: Self-pay

## 2021-01-23 VITALS — BP 120/78 | HR 103 | Ht 68.0 in | Wt 195.0 lb

## 2021-01-23 DIAGNOSIS — Z79899 Other long term (current) drug therapy: Secondary | ICD-10-CM

## 2021-01-23 DIAGNOSIS — I639 Cerebral infarction, unspecified: Secondary | ICD-10-CM | POA: Diagnosis not present

## 2021-01-23 DIAGNOSIS — R000 Tachycardia, unspecified: Secondary | ICD-10-CM | POA: Diagnosis not present

## 2021-01-23 DIAGNOSIS — I951 Orthostatic hypotension: Secondary | ICD-10-CM | POA: Diagnosis not present

## 2021-01-23 DIAGNOSIS — I10 Essential (primary) hypertension: Secondary | ICD-10-CM

## 2021-01-23 DIAGNOSIS — Z959 Presence of cardiac and vascular implant and graft, unspecified: Secondary | ICD-10-CM

## 2021-01-23 NOTE — Progress Notes (Signed)
Patient Care Team: Alba Cory, MD as PCP - General (Family Medicine) Debbe Odea, MD as PCP - Cardiology (Cardiology) Wyline Mood, MD as Consulting Physician (Gastroenterology) Salena Saner, MD as Consulting Physician (Pulmonary Disease) Lonell Face, MD as Consulting Physician (Neurology) Dayna Barker, MD as Referring Physician (Internal Medicine)   HPI  Alicia [name] is a 50 y.o. female Who suffered a cryptogenic stroke 2018 imaging demonstrated small cerebellar and occipital infarcts concerning for embolic origin; she underwent loop recorder implantation (WC) 70; hypercoagulable work-up was negative.  She ended up with a TEE with question of a interatrial septal aneurysm.  Bubble study was negative.  Because of equivocal results, she underwent TCD which was negative  Seen by Dr. Corrin Parker 7/22 with notes complaining of orthostasis and inappropriate tachycardia.  Antecedent history of COVID-pneumonia 12/21 managed with remdesivir; symptoms however a to date COVID.  Long history of shower intolerance, heat intolerance, orthostatic intolerance, exercise intolerance with tachypalpitations  Diet is salt deplete and fluid deplete  Treated with ProAmatine with some improvement; also started on digoxin.  Past history notable for GE reflux disease sleep apnea on CPAP  Remote history of a Holter about 15 or 20 years ago   DATE TEST EF   11/18 Echo   55-60 %         Date Cr K Hgb  9/22 1.01 4.4 15.0            Records and Results Reviewed  Past Medical History:  Diagnosis Date   Anemia    Anxiety    Depression    Dyspnea    SINCE STROKE   GERD (gastroesophageal reflux disease)    Hyperlipidemia    Hypertension    IBS (irritable bowel syndrome)    Migraine    Previous cesarean delivery, delivered, with or without mention of antepartum condition 01/19/2012   Psychogenic nonepileptic seizure    hx/notes 12/19/2016-LAST SEIZURE IN  2019-ON NO MEDS AS OF 05-24-19   Restless leg syndrome    Sleep apnea    USES CPAP   Stroke (HCC) 12/18/2016   Acute arterial ischemic stroke, multifocal, posterior circulation /notes 12/19/2016-LEFT SIDED WEAKNESS, MEMORY FATIGUE AND TROUBLE FINDING HER WORDS [facility]    Past Surgical History:  Procedure Laterality Date   ABDOMINOPLASTY  Feb. 2015   CESAREAN SECTION  2011   CHOLECYSTECTOMY N/A 05/31/2019   Procedure: LAPAROSCOPIC CHOLECYSTECTOMY WITH INTRAOPERATIVE CHOLANGIOGRAM;  Surgeon: Earline Mayotte, MD;  Location: [facility] ORS;  Service: General;  Laterality: N/A;   COLONOSCOPY WITH PROPOFOL N/A 11/26/2016   Procedure: COLONOSCOPY WITH PROPOFOL;  Surgeon: Wyline Mood, MD;  Location: [facility] ENDOSCOPY;  Service: Gastroenterology;  Laterality: N/A;   COLONOSCOPY WITH PROPOFOL N/A 08/08/2020   Procedure: COLONOSCOPY WITH PROPOFOL;  Surgeon: Regis Bill, MD;  Location: [facility] ENDOSCOPY;  Service: Endoscopy;  Laterality: N/A;   CYSTOSCOPY  02/02/2015   Procedure: CYSTOSCOPY;  Surgeon: Nadara Mustard, MD;  Location: [facility] ORS;  Service: Gynecology;;   DILATION AND CURETTAGE OF UTERUS  2003, 2005, 2008   ESOPHAGOGASTRODUODENOSCOPY N/A 05/31/2019   Procedure: ESOPHAGOGASTRODUODENOSCOPY (EGD);  Surgeon: Earline Mayotte, MD;  Location: [facility] ORS;  Service: General;  Laterality: N/A;  in the O.R.   ESOPHAGOGASTRODUODENOSCOPY (EGD) WITH PROPOFOL N/A 11/26/2016   Procedure: ESOPHAGOGASTRODUODENOSCOPY (EGD) WITH PROPOFOL;  Surgeon: Wyline Mood, MD;  Location: [facility] ENDOSCOPY;  Service: Gastroenterology;  Laterality: N/A;   HERNIA REPAIR  1999   Umbilical   KNEE ARTHROSCOPY Right  2006   LAPAROSCOPIC BILATERAL SALPINGECTOMY Bilateral 02/02/2015   Procedure: LAPAROSCOPIC BILATERAL SALPINGECTOMY;  Surgeon: Gae Dry, MD;  Location: [facility] ORS;  Service: Gynecology;  Laterality: Bilateral;   LAPAROSCOPIC HYSTERECTOMY N/A 02/02/2015   Procedure: HYSTERECTOMY TOTAL LAPAROSCOPIC;  Surgeon: Gae Dry, MD;  Location: [facility] ORS;  Service: Gynecology;  Laterality: N/A;   LOOP RECORDER INSERTION N/A 12/20/2016   Procedure: LOOP RECORDER INSERTION;  Surgeon: Constance Haw, MD;  Location: [place] [facility];  Service: Cardiovascular;  Laterality: N/A;   REDUCTION MAMMAPLASTY Bilateral December 2015   TEE WITHOUT CARDIOVERSION N/A 12/20/2016   Procedure: TRANSESOPHAGEAL ECHOCARDIOGRAM (TEE);  Surgeon: Luwana Spark, MD;  Location: [place];  Service: Cardiovascular;  Laterality: N/A;   TEE WITHOUT CARDIOVERSION N/A 03/29/2020   Procedure: TRANSESOPHAGEAL ECHOCARDIOGRAM (TEE);  Surgeon: Kate Sable, MD;  Location: [facility] ORS;  Service: Cardiovascular;  Laterality: N/A;    Current Meds  Medication Sig   ALPRAZolam (XANAX XR) 1 MG 24 hr tablet Take 1 tablet (1 mg total) by mouth every morning.   aspirin EC 81 MG tablet Take 81 mg by mouth daily.   cyclobenzaprine (FLEXERIL) 10 MG tablet TAKE 1 TABLET(10 MG) BY MOUTH AT BEDTIME   digoxin (LANOXIN) 0.25 MG tablet Take 1 tablet (0.25 mg total) by mouth daily.   fluticasone furoate-vilanterol (BREO ELLIPTA) 100-25 MCG/INH AEPB Inhale 1 puff into the lungs daily.   icosapent Ethyl (VASCEPA) 1 g capsule TAKE 1 CAPSULE BY MOUTH EVERY MORNING, AT NOON, AND AT BEDTIME   lamoTRIgine (LAMICTAL) 25 MG tablet Take 25 mg by mouth 2 (two) times daily.   midodrine (PROAMATINE) 5 MG tablet Take 1 tablet (5 mg total) by mouth 3 (three) times daily with meals.   pantoprazole (PROTONIX) 40 MG tablet Take 1 tablet (40 mg total) by mouth every morning.   PARoxetine (PAXIL-CR) 25 MG 24 hr tablet TAKE 2 TABLETS(50 MG) BY MOUTH DAILY   rosuvastatin (CRESTOR) 40 MG tablet Take 1 tablet (40 mg total) by mouth daily. In place of Atorvastatin   UBRELVY 100 MG TABS Take 100 mg by mouth daily as needed (migraine).    Vitamin D, Ergocalciferol, (DRISDOL) 1.25 MG (50000 UNIT) CAPS capsule Take 1 capsule (50,000 Units total) by mouth every 7 (seven) days.    zolpidem (AMBIEN CR) 12.5 MG CR tablet Take 1 tablet (12.5 mg total) by mouth at bedtime as needed for sleep.    Allergies  Allergen Reactions   Azithromycin Diarrhea   Penicillins Hives and Swelling    Has patient had a PCN reaction causing immediate rash, facial/tongue/throat swelling, SOB or lightheadedness with hypotension: Yes Has patient had a PCN reaction causing severe rash involving mucus membranes or skin necrosis: No Has patient had a PCN reaction that required hospitalization No Has patient had a PCN reaction occurring within the last 10 years: No If all of the above answers are "NO", then may proceed with Cephalosporin use.      Review of Systems negative except from HPI and PMH  Physical Exam BP 120/78 (BP Location: Left Arm, Patient Position: Sitting, Cuff Size: Normal)   Pulse (!) 103   Ht 5\' 8"  (1.727 m)   Wt 195 lb (88.5 kg)   LMP 01/15/2015 (Exact Date)   SpO2 98%   BMI 29.65 kg/m  Well developed and well nourished in no acute distress HENT normal E scleral and icterus clear Neck Supple JVP flat; carotids brisk and full  Clear to ausculation Regular rate  and rhythm, no murmurs gallops or rub Soft with active bowel sounds No clubbing cyanosis  Edema Alert and oriented, grossly normal motor and sensory function Skin Warm and Dry  ECG    CrCl cannot be calculated (Patient's most recent lab result is older than the maximum 21 days allowed.).   Assessment and  Plan  Cryptogenic stroke  Aortic septal aneurysm without PFO  Dysautonomia inappropriate sinus tachycardia/orthostatic intolerance  Implantable loop recorder in place   The patient has remote stroke.  Evaluation has been negative.  Implantable loop recorder was unrevealing.  Has reached RRT.  She would like it removed.  Discussed risks and benefits and will proceed.  Has symptoms consistent with dysautonomia manifested by inappropriate sinus tachycardia (presumed) orthostatic intolerance  and orthostatic syncope.  We discussed extensively the issues of dysautonomia, the physiology of orthstasis and positional stress.  We discussed the role of salt and water repletion, the importance of exercise, often needing to be started in the recumbent position, and the awareness of triggers and the role of ambient heat and dehydration  Discussed salt substitutes with a  goal of 2-3 gms of Sodium or 5-7 gm of sodium Chloride daily.  Rehydration solutions include Liquid IV, NUUN, TriOral, and pedialyte advanced care.  Salt tablets include plain salt tablets, SaltStick Vitassium, thermatabs amongst others.   Fluid repletion should augment the benefits of ProAmatine.  I am disinclined to use digoxin.  It has a vagotonic effect in this situation here is mostly associated with sympathetic excess/hypersensitivity we will review this with Dr. Joyce Gross Note [place] [id number]  [id number]  Preop [id number] Stroke prior loop Postop Dx same/  Procedure:loop recorder explant  Local anesthesia was delivered just lateral to the incision.  The device was removed.  A benzoin Steri-Strip dressing was applied.  Cx: none     Virl Axe, MD 01/23/2021 12:20 PM    Current medicines are reviewed at length with the patient today .  The patient does not have concerns regarding medicines.

## 2021-01-23 NOTE — Patient Instructions (Signed)
Medication Instructions:  - Your physician recommends that you continue on your current medications as directed. Please refer to the Current Medication list given to you today.  *If you need a refill on your cardiac medications before your next appointment, please call your pharmacy*   Lab Work: - Your physician recommends that you have lab work today: Digoxin level   If you have labs (blood work) drawn today and your tests are completely normal, you will receive your results only by: MyChart Message (if you have MyChart) OR A paper copy in the mail If you have any lab test that is abnormal or we need to change your treatment, we will call you to review the results.   Testing/Procedures:  1) Heart Monitor:  Length of Wear: 3 days To be placed: at home  (this should be received to your home address within 2-3 business days  - Your physician has recommended that you wear a Zio XT (heart) monitor.   This monitor is a medical device that records the heart's electrical activity. Doctors most often use these monitors to diagnose arrhythmias. Arrhythmias are problems with the speed or rhythm of the heartbeat. The monitor is a small device applied to your chest. You can wear one while you do your normal daily activities. While wearing this monitor if you have any symptoms to push the button and record what you felt. Once you have worn this monitor for the period of time provider prescribed (Usually 14 days), you will return the monitor device in the postage paid box. Once it is returned they will download the data collected and provide [place] with a report which the provider will then review and we will call you with those results. Important tips:  Avoid showering during the first 24 hours of wearing the monitor. Avoid excessive sweating to help maximize wear time. Do not submerge the device, no hot tubs, and no swimming pools. Keep any lotions or oils away from the patch. After 24 hours you may  shower with the patch on. Take brief showers with your back facing the shower head.  Do not remove patch once it has been placed because that will interrupt data and decrease adhesive wear time. Push the button when you have any symptoms and write down what you were feeling. Once you have completed wearing your monitor, remove and place into box which has postage paid and place in your outgoing mailbox.  If for some reason you have misplaced your box then call our office and we can provide another box and/or mail it off for you.     Follow-Up: At [facility], you and your health needs are our priority.  As part of our continuing mission to provide you with exceptional heart care, we have created designated Provider Care Teams.  These Care Teams include your primary Cardiologist (physician) and Advanced Practice Providers (APPs -  Physician Assistants and Nurse Practitioners) who all work together to provide you with the care you need, when you need it.  We recommend signing up for the patient portal called "MyChart".  Sign up information is provided on this After Visit Summary.  MyChart is used to connect with patients for Virtual Visits (Telemedicine).  Patients are able to view lab/test results, encounter notes, upcoming appointments, etc.  Non-urgent messages can be sent to your provider as well.   To learn more about what you can do with MyChart, go to [email or web address].    Your next appointment:   1 month(s)  The format for your next appointment:   In person/ virtual visit  Provider:   Sherryl Manges, MD    Other Instructions   1)  Post Loop Recorder implant instructions:  - You may shower tomorrow night - You may remove your tegaderm (top) dressing on Day 4 post procedure: Saturday 01/27/21 - You may remove your steri strips on Day 6 (Monday 01/29/21) post procedure if they have not fallen off on their own. - If you have any bleeding issues or concerns with your implant  site after getting home, please call our Device Clinic directly at [phone].   2) Salt Recommendations:  - Recommended salt supplementation.  The goal is about 2 g of sodium, not sodium chloride, a day.  Salt supplements include oral ThermaTabs, buffered sodium preparation, SaltStick Vitassium,  Other options include NUUN.  This comes in pill form and is dissolved in liquids.  Other liquid preparations include liquid IV, Pedialyte advance care, TRI-oral Also discussed the role of compression wear including thigh sleeves and abdominal binders.  Calf compression is not specifically recommended.Marland Kitchen

## 2021-01-24 ENCOUNTER — Telehealth: Payer: Self-pay | Admitting: Internal Medicine

## 2021-01-24 DIAGNOSIS — R000 Tachycardia, unspecified: Secondary | ICD-10-CM

## 2021-01-24 DIAGNOSIS — Z79899 Other long term (current) drug therapy: Secondary | ICD-10-CM

## 2021-01-24 LAB — DIGOXIN LEVEL: Digoxin, Serum: 1.9 ng/mL — ABNORMAL HIGH (ref 0.5–0.9)

## 2021-01-24 NOTE — Telephone Encounter (Signed)
Patient seen in office yesterday 12/6 for a ILR removal with Dr. Graciela Husbands. She was seen by Dr. Azucena Cecil on 08/28/20 and digoxin was increased to 0.25 mg QD for inappropriate sinus tachycardia.   The patient had not had a repeat digoxin level drawn since July, therefore Dr. Graciela Husbands had this drawn on the patient yesterday.  Digoxin level has come back at 1.9.  Reviewed with the patient's primary cardiologist, Dr. Azucena Cecil. Per Dr. Azucena Cecil, he received the message as below from Dr. Graciela Husbands about the patient yesterday:  Dr. Graciela Husbands- Alicia [name] this lady is better on ProAmatine and digoxin. I suspect that she is dysautonomic with inappropriate sinus tachycardia and orthostatic intolerance. Her diet is salt deplete and fluid deplete. I would be inclined to discontinue the digoxin. She may or may not need ProAmatine if we are able to adequately replete sodium and fluid. Thoughts?  Dr. Azucena Cecil- Agreed. We can try repleting fluids and sodium. ?salt tabs and see if if symptoms improve and titrate down midodrine as tolerated .  Orders received from Dr. Azucena Cecil today: 1) STOP digoxin 2) Repeat a digoxin level in 1 week (to insure the level is trending down) 3) Follow up in January as scheduled  I have spoken with the patient and notified he of the above MD recommendations as received from Dr. Azucena Cecil today and also the conversation between Drs. Klein/ Agbor-Etang.  The patient voices understanding of the above recommendation and is agreeable. She will come to the office on 12/14 @ 8:15 am for a repeat Digoxin level.

## 2021-01-25 ENCOUNTER — Encounter: Payer: Self-pay | Admitting: Family Medicine

## 2021-01-26 ENCOUNTER — Ambulatory Visit
Admission: RE | Admit: 2021-01-26 | Discharge: 2021-01-26 | Disposition: A | Discharge status: Home / Self Care | Payer: BC Managed Care – PPO | Source: Ambulatory Visit | Attending: Gastroenterology | Admitting: Gastroenterology

## 2021-01-26 ENCOUNTER — Other Ambulatory Visit: Payer: Self-pay | Admitting: Gastroenterology

## 2021-01-26 ENCOUNTER — Ambulatory Visit
Admission: RE | Admit: 2021-01-26 | Discharge: 2021-01-26 | Disposition: A | Discharge status: Home / Self Care | Payer: BC Managed Care – PPO | Attending: Gastroenterology | Admitting: Gastroenterology

## 2021-01-26 DIAGNOSIS — T189XXA Foreign body of alimentary tract, part unspecified, initial encounter: Secondary | ICD-10-CM | POA: Diagnosis not present

## 2021-01-26 DIAGNOSIS — Z9049 Acquired absence of other specified parts of digestive tract: Secondary | ICD-10-CM | POA: Diagnosis not present

## 2021-01-31 ENCOUNTER — Other Ambulatory Visit: Payer: BC Managed Care – PPO

## 2021-02-01 ENCOUNTER — Other Ambulatory Visit: Payer: Self-pay

## 2021-02-01 ENCOUNTER — Telehealth (INDEPENDENT_AMBULATORY_CARE_PROVIDER_SITE_OTHER): Payer: BC Managed Care – PPO | Admitting: Internal Medicine

## 2021-02-01 DIAGNOSIS — R0981 Nasal congestion: Secondary | ICD-10-CM | POA: Diagnosis not present

## 2021-02-01 DIAGNOSIS — U071 COVID-19: Secondary | ICD-10-CM

## 2021-02-01 DIAGNOSIS — R051 Acute cough: Secondary | ICD-10-CM

## 2021-02-01 MED ORDER — FLUTICASONE PROPIONATE 50 MCG/ACT NA SUSP
2.0000 | Freq: Every day | NASAL | 6 refills | Status: DC
  Filled 2021-02-01: qty 16, 30d supply, fill #0

## 2021-02-01 MED ORDER — BENZONATATE 100 MG PO CAPS
100.0000 mg | ORAL_CAPSULE | Freq: Two times a day (BID) | ORAL | 0 refills | Status: DC | PRN
  Filled 2021-02-01: qty 20, 10d supply, fill #0

## 2021-02-01 MED ORDER — MOLNUPIRAVIR EUA 200MG CAPSULE
4.0000 | ORAL_CAPSULE | Freq: Two times a day (BID) | ORAL | 0 refills | Status: AC
  Filled 2021-02-01: qty 40, 5d supply, fill #0

## 2021-02-01 MED ORDER — ALBUTEROL SULFATE HFA 108 (90 BASE) MCG/ACT IN AERS
2.0000 | INHALATION_SPRAY | Freq: Four times a day (QID) | RESPIRATORY_TRACT | 0 refills | Status: DC | PRN
  Filled 2021-02-01: qty 8.5, 25d supply, fill #0

## 2021-02-01 MED ORDER — GUAIFENESIN ER 1200 MG PO TB12
1200.0000 mg | ORAL_TABLET | Freq: Every day | ORAL | 0 refills | Status: DC
  Filled 2021-02-01: qty 14, 14d supply, fill #0

## 2021-02-01 NOTE — Patient Instructions (Signed)
It was great seeing you today!  Plan discussed at today's visit: -Antiviral therapy, cough suppressant, Mucinex, Flonase and albuterol inhaler sent to pharmacy. -Continue to use Ellipta inhaler daily and albuterol as needed -Can use over-the-counter decongestant for sinus pain pressure -Monitor oxygen saturation with pulse ox, if oxygen less than 88% consistently, will need to present to the emergency department -Please call with any concerns or changes  Follow up in: 2 weeks in person for breast exam  Take care and let us know if you have any questions or concerns prior to your next visit.  Dr. Caralee Ates

## 2021-02-01 NOTE — Progress Notes (Signed)
Virtual Visit via Video Note  I connected with Alicia [name] Note [place] on 02/01/21 at  2:40 PM EST by a video enabled telemedicine application and verified that I am speaking with the correct person using two identifiers.  Location: Patient: Home Provider: [facility]   I discussed the limitations of evaluation and management by telemedicine and the availability of in person appointments. The patient expressed understanding and agreed to proceed.  History of Present Illness:  Alicia [name] is a 50 year old female presenting via telemedicine for COVID-19.  She did a home test today that was positive.  Her symptoms began yesterday on 01/31/2021.  She is very concerned because this time last year she tested positive for COVID and after receiving the monoclonal antibody treatment her symptoms rapidly declined and she was hospitalized for some time and very ill.  Today she states she was at a church event over the weekend where multiple people were found to be COVID-positive.  Worsening symptoms include chest tightness, sinus congestion, fever, headache and change in taste. She has not have any COVID vaccines due to history of CVA.  URI Compliant:  -Fever: yes, uncertain how high did not have a thermometer to check -Cough: yes dry cough -Shortness of breath: no -Wheezing: no -Chest pain: yes, with cough -Chest tightness: yes -Chest congestion: yes -Nasal congestion: yes -Runny nose: yes -Post nasal drip: yes -Sneezing: no -Sore throat: yes -Swollen glands: no -Sinus pressure: yes -Headache: no -Face pain: no -Nausea: yes -Vomiting: no -Diarrhea: no -Fatigue: yes -Sick contacts: yes, church choir on Sunday  -Relief with OTC cold/cough medications: no  -Treatments attempted: Mucinex, cold and flu, Tylenol.  Observations/Objective:  General: well appearing, no acute distress ENT: conjunctiva normal appearing bilaterally, congested sounding  Pulm: occasional dry cough Skin: no rashes,  cyanosis or abnormal bruising noted Neuro: answers questions appropriately Psych: tearful  Assessment and Plan:  1. COVID-19/Acute cough/Nasal congestion: She is within the treatment window for antiviral therapy, which was sent to the pharmacy.  We will treat symptomatically with high-dose Mucinex for cough congestion.  She will continue to use her Breo Ellipta daily as well as albuterol inhaler as needed.  She was also given a cough suppressant and nasal steroids.  She does have a pulse oximeter and can monitor her oxygen levels, she was advised if her pulse ox is below 88% consistently she will need to present to the emergency room.  Follow-up in 2 weeks for in person visit to discuss breast pain.  - molnupiravir EUA (LAGEVRIO) 200 mg CAPS capsule; Take 4 capsules (800 mg total) by mouth 2 (two) times daily for 5 days.  Dispense: 40 capsule; Refill: 0 - albuterol (VENTOLIN HFA) 108 (90 Base) MCG/ACT inhaler; Inhale 2 puffs into the lungs every 6 (six) hours as needed for wheezing or shortness of breath.  Dispense: 8.5 g; Refill: 0 - benzonatate (TESSALON) 100 MG capsule; Take 1 capsule (100 mg total) by mouth 2 (two) times daily as needed for cough.  Dispense: 20 capsule; Refill: 0 - Guaifenesin 1200 MG TB12; Take 1 tablet (1,200 mg total) by mouth daily. Use as needed for chest congestion.  Dispense: 14 tablet; Refill: 0 - fluticasone (FLONASE) 50 MCG/ACT nasal spray; Place 2 sprays into both nostrils daily.  Dispense: 16 g; Refill: 6   Follow Up Instructions: As needed if symptoms worsen or fail to improve.  Follow-up in 2 weeks for in person exam for breast pain.  I discussed the assessment and treatment plan with the  patient. The patient was provided an opportunity to ask questions and all were answered. The patient agreed with the plan and demonstrated an understanding of the instructions.   The patient was advised to call back or seek an in-person evaluation if the symptoms worsen or if  the condition fails to improve as anticipated.  I provided 35 minutes of non-face-to-face time during this encounter.   Margarita Mail, DO

## 2021-02-02 ENCOUNTER — Telehealth: Payer: Self-pay

## 2021-02-02 NOTE — Telephone Encounter (Signed)
Completed.

## 2021-02-02 NOTE — Telephone Encounter (Signed)
Copied from CRM [id number]. Topic: General - Other >> Feb 01, 2021  4:56 PM Marylen Ponto wrote: Reason for CRM: Pt stated she needs a note for work showing she tested positive for Covid. Pt also asked that the note include the quarantine timeframe. Pt stated she checked her MYCHART but there is no note for work.

## 2021-02-07 DIAGNOSIS — K862 Cyst of pancreas: Secondary | ICD-10-CM | POA: Diagnosis not present

## 2021-02-07 DIAGNOSIS — K760 Fatty (change of) liver, not elsewhere classified: Secondary | ICD-10-CM | POA: Diagnosis not present

## 2021-02-15 ENCOUNTER — Encounter: Payer: Self-pay | Admitting: Internal Medicine

## 2021-02-15 ENCOUNTER — Other Ambulatory Visit: Payer: Self-pay | Admitting: Family Medicine

## 2021-02-15 ENCOUNTER — Ambulatory Visit (INDEPENDENT_AMBULATORY_CARE_PROVIDER_SITE_OTHER): Payer: BC Managed Care – PPO | Admitting: Internal Medicine

## 2021-02-15 VITALS — BP 132/86 | HR 112 | Temp 97.9°F | Resp 16 | Ht 68.0 in | Wt 186.8 lb

## 2021-02-15 DIAGNOSIS — N644 Mastodynia: Secondary | ICD-10-CM | POA: Diagnosis not present

## 2021-02-15 DIAGNOSIS — R590 Localized enlarged lymph nodes: Secondary | ICD-10-CM | POA: Diagnosis not present

## 2021-02-15 NOTE — Patient Instructions (Addendum)
It was great seeing you today!  Plan discussed at today's visit: -Screening mammogram, ultrasound of left breast and ultrasound of right neck lymph node ordered -Recommend repeating CBC to make sure white count is back to normal  -Can use moist heat, Voltaren gel and Tylenol as needed for pain   Follow up in: January 26, already scheduled  Take care and let us know if you have any questions or concerns prior to your next visit.  Dr. Caralee Ates

## 2021-02-15 NOTE — Progress Notes (Signed)
Acute Office Visit  Subjective:    Patient ID: Alicia [name], female    DOB: 10/24/1970, 50 y.o.   MRN: [id number]  Chief Complaint  Patient presents with   Breast Pain    left    HPI Patient is in today for breast pain. Multiple lumps on both sides since the end of 2020. Left breast pain is new over the last 2-3 weeks. She was going to be seen at that time but unfortunately she tested positive for COVID but is recovering and doing better from a respiratory stand point. She does have some right humerus/deltoid pain that radiates into the shoulder, no pain on the left, but described as a burning type pain or overstretched tendon type pain. Tylenol does not help.  Breast Pain: -Duration: 3 weeks, had multiple lumps since end of 2020 -Location: left -Onset: sudden -Severity: moderate -Quality: burning and sore -Frequency: intermittent -Redness: no -Swelling: yes in neck and axillary bilateral  -Trauma:  hx of breast reduction, end of 2015, no trauma since pain started  -Breastfeeding: no -Associated with menstral cycle: no, history of hysterectomy, does have fullness feeling in breast every couple weeks which makes pain worse. Hysterectomy in 2016 -Nipple discharge: no but does feel like left nipple is slightly more inverted over last few days -Breast lump: yes multiple on left -Status: fluctuating -Treatments attempted: none -Previous mammogram:  Last one in 2013 -Did have an screening type [place] through her job: 01/23/21 showing right axillary lymph node corresponding to palpable lesion with scattered cystic densities with recommendation to follow up for physical exam. -Family history of breast or ovarian cancer: None -Fevers/night sweats: none -Weight changes: lost about 10 lbs since October but had been sick with COVID -Last blood work: Elevated WBC to 14.4 on CBC in September    Past Medical History:  Diagnosis Date   Anemia    Anxiety    Depression    Dyspnea     SINCE STROKE   GERD (gastroesophageal reflux disease)    Hyperlipidemia    Hypertension    IBS (irritable bowel syndrome)    Migraine    Previous cesarean delivery, delivered, with or without mention of antepartum condition 01/19/2012   Psychogenic nonepileptic seizure    hx/notes 12/19/2016-LAST SEIZURE IN 2019-ON NO MEDS AS OF 05-24-19   Restless leg syndrome    Sleep apnea    USES CPAP   Stroke ([place]) 12/18/2016   Acute arterial ischemic stroke, multifocal, posterior circulation /notes 12/19/2016-LEFT SIDED WEAKNESS, MEMORY FATIGUE AND TROUBLE FINDING HER WORDS [facility]    Past Surgical History:  Procedure Laterality Date   ABDOMINOPLASTY  Feb. 2015   CESAREAN SECTION  2011   CHOLECYSTECTOMY N/A 05/31/2019   Procedure: LAPAROSCOPIC CHOLECYSTECTOMY WITH INTRAOPERATIVE CHOLANGIOGRAM;  Surgeon: Robert Bellow, MD;  Location: [facility] ORS;  Service: General;  Laterality: N/A;   COLONOSCOPY WITH PROPOFOL N/A 11/26/2016   Procedure: COLONOSCOPY WITH PROPOFOL;  Surgeon: Jonathon Bellows, MD;  Location: [facility] ENDOSCOPY;  Service: Gastroenterology;  Laterality: N/A;   COLONOSCOPY WITH PROPOFOL N/A 08/08/2020   Procedure: COLONOSCOPY WITH PROPOFOL;  Surgeon: Lesly Rubenstein, MD;  Location: [facility] ENDOSCOPY;  Service: Endoscopy;  Laterality: N/A;   CYSTOSCOPY  02/02/2015   Procedure: CYSTOSCOPY;  Surgeon: Gae Dry, MD;  Location: [facility] ORS;  Service: Gynecology;;   DILATION AND CURETTAGE OF UTERUS  2003, 2005, 2008   ESOPHAGOGASTRODUODENOSCOPY N/A 05/31/2019   Procedure: ESOPHAGOGASTRODUODENOSCOPY (EGD);  Surgeon: Robert Bellow, MD;  Location: [facility]  ORS;  Service: General;  Laterality: N/A;  in the O.R.   ESOPHAGOGASTRODUODENOSCOPY (EGD) WITH PROPOFOL N/A 11/26/2016   Procedure: ESOPHAGOGASTRODUODENOSCOPY (EGD) WITH PROPOFOL;  Surgeon: Jonathon Bellows, MD;  Location: [facility] ENDOSCOPY;  Service: Gastroenterology;  Laterality: N/A;   HERNIA REPAIR  [id number]   Umbilical   KNEE ARTHROSCOPY Right 2004/04/18    LAPAROSCOPIC BILATERAL SALPINGECTOMY Bilateral 02/02/2015   Procedure: LAPAROSCOPIC BILATERAL SALPINGECTOMY;  Surgeon: Gae Dry, MD;  Location: [facility] ORS;  Service: Gynecology;  Laterality: Bilateral;   LAPAROSCOPIC HYSTERECTOMY N/A 02/02/2015   Procedure: HYSTERECTOMY TOTAL LAPAROSCOPIC;  Surgeon: Gae Dry, MD;  Location: [facility] ORS;  Service: Gynecology;  Laterality: N/A;   LOOP RECORDER INSERTION N/A 12/20/2016   Procedure: LOOP RECORDER INSERTION;  Surgeon: Constance Haw, MD;  Location: [place] CV LAB;  Service: Cardiovascular;  Laterality: N/A;   REDUCTION MAMMAPLASTY Bilateral December 2015   TEE WITHOUT CARDIOVERSION N/A 12/20/2016   Procedure: TRANSESOPHAGEAL ECHOCARDIOGRAM (TEE);  Surgeon: Arelly Spark, MD;  Location: [place];  Service: Cardiovascular;  Laterality: N/A;   TEE WITHOUT CARDIOVERSION N/A 03/29/2020   Procedure: TRANSESOPHAGEAL ECHOCARDIOGRAM (TEE);  Surgeon: Kate Sable, MD;  Location: [facility] ORS;  Service: Cardiovascular;  Laterality: N/A;    Family History  Problem Relation Age of Onset   Hypertension Mother    Hyperlipidemia Mother    Heart Problems Father        hole in heart and lower ventricles reversed   Prostate cancer Maternal Grandfather    Von Willebrand disease Maternal Uncle     Social History   Socioeconomic History   Marital status: Married    Spouse name: John    Number of children: 4   Years of education: Not on file   Highest education level: Not on file  Occupational History   Occupation: Aeronautical engineer: [employer]  Tobacco Use   Smoking status: Never   Smokeless tobacco: Never  Vaping Use   Vaping Use: Never used  Substance and Sexual Activity   Alcohol use: No    Alcohol/week: 0.0 standard drinks   Drug use: No   Sexual activity: Yes    Partners: Male  Other Topics Concern   Not on file  Social History Narrative   First husband died suddenly in 04/18/2014   Remarried 06/07/2016    Social Determinants of Health   Financial Resource Strain: Not on file  Food Insecurity: Not on file  Transportation Needs: Not on file  Physical Activity: Not on file  Stress: Not on file  Social Connections: Not on file  Intimate Partner Violence: Not on file    Outpatient Medications Prior to Visit  Medication Sig Dispense Refill   albuterol (VENTOLIN HFA) 108 (90 Base) MCG/ACT inhaler Inhale 2 puffs into the lungs every 6 (six) hours as needed for wheezing or shortness of breath. 8.5 g 0   ALPRAZolam (XANAX XR) 1 MG 24 hr tablet Take 1 tablet (1 mg total) by mouth every morning. 90 tablet 0   aspirin EC 81 MG tablet Take 81 mg by mouth daily.     atorvastatin (LIPITOR) 80 MG tablet Take 80 mg by mouth daily. (Patient not taking: Reported on 02/01/2021)     benzonatate (TESSALON) 100 MG capsule Take 1 capsule (100 mg total) by mouth 2 (two) times daily as needed for cough. 20 capsule 0   cyclobenzaprine (FLEXERIL) 10 MG tablet TAKE 1 TABLET(10 MG) BY MOUTH AT BEDTIME 90 tablet 1   fluticasone (  FLONASE) 50 MCG/ACT nasal spray Place 2 sprays into both nostrils daily. 16 g 6   fluticasone furoate-vilanterol (BREO ELLIPTA) 100-25 MCG/INH AEPB Inhale 1 puff into the lungs daily. 28 each 0   Guaifenesin 1200 MG TB12 Take 1 tablet (1,200 mg total) by mouth daily. Use as needed for chest congestion. 14 tablet 0   icosapent Ethyl (VASCEPA) 1 g capsule TAKE 1 CAPSULE BY MOUTH EVERY MORNING, AT NOON, AND AT BEDTIME 270 capsule 1   lamoTRIgine (LAMICTAL) 25 MG tablet Take 25 mg by mouth 2 (two) times daily.     midodrine (PROAMATINE) 5 MG tablet Take 1 tablet (5 mg total) by mouth 3 (three) times daily with meals. 90 tablet 0   pantoprazole (PROTONIX) 40 MG tablet Take 1 tablet (40 mg total) by mouth every morning. 90 tablet 1   PARoxetine (PAXIL-CR) 25 MG 24 hr tablet TAKE 2 TABLETS(50 MG) BY MOUTH DAILY 60 tablet 0   rosuvastatin (CRESTOR) 40 MG tablet Take 1 tablet (40 mg total) by mouth  daily. In place of Atorvastatin 90 tablet 1   UBRELVY 100 MG TABS Take 100 mg by mouth daily as needed (migraine).      Vitamin D, Ergocalciferol, (DRISDOL) 1.25 MG (50000 UNIT) CAPS capsule Take 1 capsule (50,000 Units total) by mouth every 7 (seven) days. 12 capsule 1   zolpidem (AMBIEN CR) 12.5 MG CR tablet Take 1 tablet (12.5 mg total) by mouth at bedtime as needed for sleep. 90 tablet 0   No facility-administered medications prior to visit.    Allergies  Allergen Reactions   Azithromycin Diarrhea   Penicillins Hives and Swelling    Has patient had a PCN reaction causing immediate rash, facial/tongue/throat swelling, SOB or lightheadedness with hypotension: Yes Has patient had a PCN reaction causing severe rash involving mucus membranes or skin necrosis: No Has patient had a PCN reaction that required hospitalization No Has patient had a PCN reaction occurring within the last 10 years: No If all of the above answers are "NO", then may proceed with Cephalosporin use.    Review of Systems  Constitutional:  Negative for chills and fever.  Respiratory:  Positive for cough. Negative for shortness of breath and wheezing.   Cardiovascular:  Negative for chest pain.  Hematological:  Positive for adenopathy.      Objective:    Physical Exam Constitutional:      Appearance: Normal appearance.  HENT:     Head: Normocephalic and atraumatic.  Eyes:     Conjunctiva/sclera: Conjunctivae normal.  Cardiovascular:     Rate and Rhythm: Normal rate and regular rhythm.  Pulmonary:     Effort: Pulmonary effort is normal.     Breath sounds: Normal breath sounds.  Chest:  Breasts:    Right: No swelling, bleeding, inverted nipple, mass, nipple discharge, skin change or tenderness.     Left: Mass and tenderness present. No swelling, bleeding, inverted nipple, nipple discharge or skin change.     Comments: Bilateral axillary lymphadenopathy, worse on left side compared to right. Scattered masses  palpated throughout left breast, larges is in lower outer quadrant, tender and mobile.  Musculoskeletal:     Right lower leg: No edema.     Left lower leg: No edema.  Lymphadenopathy:     Cervical: Cervical adenopathy present.     Right cervical: Deep cervical adenopathy present.     Upper Body:     Right upper body: Axillary adenopathy present.     Left  upper body: Axillary adenopathy present.  Skin:    General: Skin is warm and dry.  Neurological:     General: No focal deficit present.     Mental Status: She is alert. Mental status is at baseline.  Psychiatric:        Mood and Affect: Mood normal.        Behavior: Behavior normal.    BP 132/86    Pulse (!) 112    Temp 97.9 F (36.6 C)    Resp 16    Ht _0  (1.727 m)    Wt 186 lb 12.8 oz (84.7 kg)    LMP 01/15/2015 (Exact Date)    SpO2 95%    BMI 28.40 kg/m  Wt Readings from Last 3 Encounters:  02/15/21 186 lb 12.8 oz (84.7 kg)  01/23/21 195 lb (88.5 kg)  12/13/20 196 lb (88.9 kg)    Health Maintenance Due  Topic Date Due   Pneumococcal Vaccine 42-73 Years old (1 - PCV) Never done   Zoster Vaccines- Shingrix (1 of 2) Never done   MAMMOGRAM  09/30/2020    There are no preventive care reminders to display for this patient.   Lab Results  Component Value Date   TSH 3.400 10/30/2020   Lab Results  Component Value Date   WBC 14.4 (H) 10/30/2020   HGB 15.0 10/30/2020   HCT 46.3 10/30/2020   MCV 90 10/30/2020   PLT 396 10/30/2020   Lab Results  Component Value Date   NA 145 (H) 10/30/2020   K 4.4 10/30/2020   CO2 25 10/30/2020   GLUCOSE 132 (H) 10/30/2020   BUN 20 10/30/2020   CREATININE 1.01 (H) 10/30/2020   BILITOT 0.4 10/30/2020   ALKPHOS 109 10/30/2020   AST 17 10/30/2020   ALT 25 10/30/2020   PROT 6.5 10/30/2020   ALBUMIN 4.5 10/30/2020   CALCIUM 9.4 10/30/2020   ANIONGAP 9 02/24/2020   EGFR 68 10/30/2020   Lab Results  Component Value Date   CHOL 218 (H) 10/30/2020   Lab Results  Component  Value Date   HDL 34 (L) 10/30/2020   Lab Results  Component Value Date   LDLCALC 77 10/30/2020   Lab Results  Component Value Date   TRIG 673 (HH) 10/30/2020   Lab Results  Component Value Date   CHOLHDL 6.4 (H) 10/30/2020   Lab Results  Component Value Date   HGBA1C 6.4 (H) 10/30/2020       Assessment & Plan:   1. Breast pain, left/Axillary lymphadenopathy: She is due for a screening bilateral mammogram so we will start there but an ultrasound of the left breast due to pain and increased axillary lymphadenopathy with mass palpated at the 4 o'clock position.   - MM Digital Screening; Future - US BREAST LTD UNI LEFT INC AXILLA; Future  2. Anterior cervical lymphadenopathy: Patient is also concerned about an enlarged cervical lymph node, ultrasound ordered of this region as well. WBC increased on last labs in September, recommend rechecking at next appointment as she is just recovering from [place] now and this may impact results. Follow up already scheduled in 1 month.  - US Soft Tissue Head/Neck (NON-THYROID); Future   Teodora Medici, DO

## 2021-02-21 DIAGNOSIS — R000 Tachycardia, unspecified: Secondary | ICD-10-CM | POA: Diagnosis not present

## 2021-02-22 ENCOUNTER — Other Ambulatory Visit: Payer: Self-pay

## 2021-02-22 DIAGNOSIS — K862 Cyst of pancreas: Secondary | ICD-10-CM | POA: Diagnosis not present

## 2021-02-22 NOTE — Telephone Encounter (Signed)
*  STAT* If patient is at the pharmacy, call can be transferred to refill team.   1. Which medications need to be refilled? (please list name of each medication and dose if known) Midodrine  2. Which pharmacy/location (including street and city if local pharmacy) is medication to be sent to? [facility] [address]  3. Do they need a 30 day or 90 day supply? 30

## 2021-02-23 ENCOUNTER — Other Ambulatory Visit: Payer: BC Managed Care – PPO

## 2021-02-23 MED ORDER — MIDODRINE HCL 5 MG PO TABS: 5.0000 mg | ORAL_TABLET | Freq: Three times a day (TID) | ORAL | 0 refills | Status: DC

## 2021-02-26 ENCOUNTER — Other Ambulatory Visit: Payer: Self-pay

## 2021-02-26 DIAGNOSIS — K219 Gastro-esophageal reflux disease without esophagitis: Secondary | ICD-10-CM

## 2021-02-26 MED ORDER — PANTOPRAZOLE SODIUM 40 MG PO TBEC: 40.0000 mg | DELAYED_RELEASE_TABLET | ORAL | 1 refills | Status: DC

## 2021-02-27 ENCOUNTER — Ambulatory Visit (INDEPENDENT_AMBULATORY_CARE_PROVIDER_SITE_OTHER): Payer: BC Managed Care – PPO | Admitting: Internal Medicine

## 2021-02-27 ENCOUNTER — Other Ambulatory Visit: Payer: Self-pay

## 2021-02-27 ENCOUNTER — Encounter: Payer: Self-pay | Admitting: Internal Medicine

## 2021-02-27 VITALS — BP 119/60 | HR 105 | Ht 68.0 in | Wt 187.0 lb

## 2021-02-27 DIAGNOSIS — I639 Cerebral infarction, unspecified: Secondary | ICD-10-CM | POA: Diagnosis not present

## 2021-02-27 DIAGNOSIS — G901 Familial dysautonomia [Riley-Day]: Secondary | ICD-10-CM | POA: Diagnosis not present

## 2021-02-27 DIAGNOSIS — R000 Tachycardia, unspecified: Secondary | ICD-10-CM

## 2021-02-27 MED ORDER — MIDODRINE HCL 5 MG PO TABS: ORAL_TABLET | ORAL | 0 refills | Status: DC

## 2021-02-27 NOTE — Progress Notes (Signed)
Patient Care Team: Steele Sizer, MD as PCP - General (Family Medicine) Kate Sable, MD as PCP - Cardiology (Cardiology) Jonathon Bellows, MD as Consulting Physician (Gastroenterology) Tyler Pita, MD as Consulting Physician (Pulmonary Disease) Vladimir Crofts, MD as Consulting Physician (Neurology) Marlowe Sax, MD as Referring Physician (Internal Medicine)   HPI  [name] Note Wuertz is a 51 y.o. female Who suffered a cryptogenic stroke 2018 imaging demonstrated small cerebellar and occipital infarcts concerning for embolic origin; she underwent loop recorder implantation (WC) 70; hypercoagulable work-up was negative.  She ended up with a TEE with question of a interatrial septal aneurysm.  Bubble study was negative.  Because of equivocal results, she underwent TCD which was negative  Seen by Dr. Waldron Session 7/22 with notes complaining of orthostasis and inappropriate tachycardia.  Antecedent history of COVID-pneumonia 12/21 managed with remdesivir; symptoms however a to date COVID.  Long history of shower intolerance, heat intolerance, orthostatic intolerance, exercise intolerance with tachypalpitations  Diet is salt deplete and fluid deplete, not really much better, but has not been intentional about sodium repletion.  Still with tachy palpitations with exertion.  Event recorder was reviewed with the patient.  Showing sinus tachycardia   DATE TEST EF   11/18 Echo   55-60 %         Date Cr K Hgb  9/22 1.01 4.4 15.0            Records and Results Reviewed  Past Medical History:  Diagnosis Date   Anemia    Anxiety    Depression    Dyspnea    SINCE STROKE   GERD (gastroesophageal reflux disease)    Hyperlipidemia    Hypertension    IBS (irritable bowel syndrome)    Migraine    Previous cesarean delivery, delivered, with or without mention of antepartum condition 01/19/2012   Psychogenic nonepileptic seizure    hx/notes 12/19/2016-LAST SEIZURE IN  2019-ON NO MEDS AS OF 05-24-19   Restless leg syndrome    Sleep apnea    USES CPAP   Stroke ([place]) 12/18/2016   Acute arterial ischemic stroke, multifocal, posterior circulation /notes 12/19/2016-LEFT SIDED WEAKNESS, MEMORY FATIGUE AND TROUBLE FINDING HER WORDS [facility]    Past Surgical History:  Procedure Laterality Date   ABDOMINOPLASTY  Feb. 2015   CESAREAN SECTION  2011   CHOLECYSTECTOMY N/A 05/31/2019   Procedure: LAPAROSCOPIC CHOLECYSTECTOMY WITH INTRAOPERATIVE CHOLANGIOGRAM;  Surgeon: Robert Bellow, MD;  Location: [facility] ORS;  Service: General;  Laterality: N/A;   COLONOSCOPY WITH PROPOFOL N/A 11/26/2016   Procedure: COLONOSCOPY WITH PROPOFOL;  Surgeon: Jonathon Bellows, MD;  Location: [facility] ENDOSCOPY;  Service: Gastroenterology;  Laterality: N/A;   COLONOSCOPY WITH PROPOFOL N/A 08/08/2020   Procedure: COLONOSCOPY WITH PROPOFOL;  Surgeon: Lesly Rubenstein, MD;  Location: [facility] ENDOSCOPY;  Service: Endoscopy;  Laterality: N/A;   CYSTOSCOPY  02/02/2015   Procedure: CYSTOSCOPY;  Surgeon: Gae Dry, MD;  Location: [facility] ORS;  Service: Gynecology;;   DILATION AND CURETTAGE OF UTERUS  2003, 2005, 2008   ESOPHAGOGASTRODUODENOSCOPY N/A 05/31/2019   Procedure: ESOPHAGOGASTRODUODENOSCOPY (EGD);  Surgeon: Robert Bellow, MD;  Location: [facility] ORS;  Service: General;  Laterality: N/A;  in the O.R.   ESOPHAGOGASTRODUODENOSCOPY (EGD) WITH PROPOFOL N/A 11/26/2016   Procedure: ESOPHAGOGASTRODUODENOSCOPY (EGD) WITH PROPOFOL;  Surgeon: Jonathon Bellows, MD;  Location: [facility] ENDOSCOPY;  Service: Gastroenterology;  Laterality: N/A;   HERNIA REPAIR  [id number]   Umbilical   KNEE ARTHROSCOPY Right 2006   LAPAROSCOPIC  BILATERAL SALPINGECTOMY Bilateral 02/02/2015   Procedure: LAPAROSCOPIC BILATERAL SALPINGECTOMY;  Surgeon: Gae Dry, MD;  Location: [facility] ORS;  Service: Gynecology;  Laterality: Bilateral;   LAPAROSCOPIC HYSTERECTOMY N/A 02/02/2015   Procedure: HYSTERECTOMY TOTAL LAPAROSCOPIC;  Surgeon: Gae Dry, MD;  Location: [facility] ORS;  Service: Gynecology;  Laterality: N/A;   LOOP RECORDER INSERTION N/A 12/20/2016   Procedure: LOOP RECORDER INSERTION;  Surgeon: Constance Haw, MD;  Location: [place] [facility] LAB;  Service: Cardiovascular;  Laterality: N/A;   REDUCTION MAMMAPLASTY Bilateral December 2015   TEE WITHOUT CARDIOVERSION N/A 12/20/2016   Procedure: TRANSESOPHAGEAL ECHOCARDIOGRAM (TEE);  Surgeon: Jocelynn Spark, MD;  Location: [place];  Service: Cardiovascular;  Laterality: N/A;   TEE WITHOUT CARDIOVERSION N/A 03/29/2020   Procedure: TRANSESOPHAGEAL ECHOCARDIOGRAM (TEE);  Surgeon: Kate Sable, MD;  Location: [facility] ORS;  Service: Cardiovascular;  Laterality: N/A;    Current Meds  Medication Sig   ALPRAZolam (XANAX XR) 1 MG 24 hr tablet Take 1 tablet (1 mg total) by mouth every morning.   aspirin EC 81 MG tablet Take 81 mg by mouth daily.   cyclobenzaprine (FLEXERIL) 10 MG tablet TAKE 1 TABLET(10 MG) BY MOUTH AT BEDTIME   fluticasone furoate-vilanterol (BREO ELLIPTA) 100-25 MCG/INH AEPB Inhale 1 puff into the lungs daily.   icosapent Ethyl (VASCEPA) 1 g capsule TAKE 1 CAPSULE BY MOUTH EVERY MORNING, AT NOON, AND AT BEDTIME   lamoTRIgine (LAMICTAL) 25 MG tablet Take 25 mg by mouth 2 (two) times daily.   midodrine (PROAMATINE) 5 MG tablet Take 1 tablet (5 mg total) by mouth 3 (three) times daily with meals.   pantoprazole (PROTONIX) 40 MG tablet Take 1 tablet (40 mg total) by mouth every morning.   PARoxetine (PAXIL-CR) 25 MG 24 hr tablet TAKE 2 TABLETS(50 MG) BY MOUTH DAILY   rosuvastatin (CRESTOR) 40 MG tablet Take 1 tablet (40 mg total) by mouth daily. In place of Atorvastatin   UBRELVY 100 MG TABS Take 100 mg by mouth daily as needed (migraine).    Vitamin D, Ergocalciferol, (DRISDOL) 1.25 MG (50000 UNIT) CAPS capsule Take 1 capsule (50,000 Units total) by mouth every 7 (seven) days.   zolpidem (AMBIEN CR) 12.5 MG CR tablet Take 1 tablet (12.5 mg total) by mouth at  bedtime as needed for sleep.    Allergies  Allergen Reactions   Azithromycin Diarrhea   Penicillins Hives and Swelling    Has patient had a PCN reaction causing immediate rash, facial/tongue/throat swelling, SOB or lightheadedness with hypotension: Yes Has patient had a PCN reaction causing severe rash involving mucus membranes or skin necrosis: No Has patient had a PCN reaction that required hospitalization No Has patient had a PCN reaction occurring within the last 10 years: No If all of the above answers are "NO", then may proceed with Cephalosporin use.      Review of Systems negative except from HPI and PMH  Physical Exam BP 119/60 (BP Location: Right Arm, Patient Position: Sitting, Cuff Size: Normal)    Pulse (!) 105    Ht 5\' 8"  (1.727 m)    Wt 187 lb (84.8 kg)    LMP 01/15/2015 (Exact Date)    SpO2 98%    BMI 28.43 kg/m  Well developed and nourished in no acute distress HENT normal Neck supple with JVP-  flat  Clear Regular rate and rhythm, no murmurs or gallops Abd-soft with active BS No Clubbing cyanosis edema Skin-warm and dry A & Oriented  Grossly normal sensory  and motor function        Assessment and  Plan  Cryptogenic stroke  Aortic septal aneurysm without PFO  Dysautonomia inappropriate sinus tachycardia/orthostatic intolerance    Discussed again the importance of salt and water repletion.  She has been pushing the fluids without the sodium.  And not such a big enthusiast of midodrine in a situation, particularly in light of a deficit of sodium.  We did discuss taking it about every 4 hours but would be inclined towards stopping it as symptoms improve, as I hope very well with fluid repletion.  At this juncture, fludrocortisone would be preferable and adjunctive beta-blockers might also be helpful.  Again though fluids are the foundation     Virl Axe, MD 02/27/2021 9:06 AM    Current medicines are reviewed at length with the patient today .  The  patient does not have concerns regarding medicines.

## 2021-02-27 NOTE — Patient Instructions (Signed)
Medication Instructions:  - Your physician has recommended you make the following change in your medication:   1) Take your current dose of proamatine (midodrine) @ 7 am, 11 am, & 3 pm  *If you need a refill on your cardiac medications before your next appointment, please call your pharmacy*   Lab Work: - none ordered  If you have labs (blood work) drawn today and your tests are completely normal, you will receive your results only by: MyChart Message (if you have MyChart) OR A paper copy in the mail If you have any lab test that is abnormal or we need to change your treatment, we will call you to review the results.   Testing/Procedures: - none ordered   Follow-Up: At [facility], you and your health needs are our priority.  As part of our continuing mission to provide you with exceptional heart care, we have created designated Provider Care Teams.  These Care Teams include your primary Cardiologist (physician) and Advanced Practice Providers (APPs -  Physician Assistants and Nurse Practitioners) who all work together to provide you with the care you need, when you need it.  We recommend signing up for the patient portal called "MyChart".  Sign up information is provided on this After Visit Summary.  MyChart is used to connect with patients for Virtual Visits (Telemedicine).  Patients are able to view lab/test results, encounter notes, upcoming appointments, etc.  Non-urgent messages can be sent to your provider as well.   To learn more about what you can do with MyChart, go to [email or web address].    Your next appointment:   4 month(s)  The format for your next appointment:   In Person  Provider:   Sherryl Manges, MD    Other Instructions - Recommended salt supplementation.  The goal is about 2 g of sodium, not sodium chloride, a day.  Salt supplements include oral ThermaTabs, buffered sodium preparation, SaltStick Vitassium,  Other options include NUUN.  This comes in  pill form and is dissolved in liquids.  Other liquid preparations include liquid IV, Pedialyte advance care, TRI-oral Also discussed the role of compression wear including thigh sleeves and abdominal binders.  Calf compression is not specifically recommended.Marland Kitchen

## 2021-03-01 ENCOUNTER — Ambulatory Visit: Payer: BC Managed Care – PPO | Admitting: Cardiology

## 2021-03-04 ENCOUNTER — Telehealth: Payer: Self-pay | Admitting: Family Medicine

## 2021-03-04 NOTE — Telephone Encounter (Signed)
dc'd 12/06/20 (dose changed) #90 1 RF   Requested Prescriptions  Refused Prescriptions Disp Refills   PARoxetine (PAXIL-CR) 37.5 MG 24 hr tablet [Pharmacy Med Name: PAROXETINE ER 37.5MG  TABLETS] 90 tablet 1    Sig: TAKE 1 TABLET(37.5 MG) BY MOUTH DAILY     Psychiatry:  Antidepressants - SSRI Passed - 03/04/2021  1:42 PM      Passed - Completed PHQ-2 or PHQ-9 in the last 360 days      Passed - Valid encounter within last 6 months    Recent Outpatient Visits          2 weeks ago Breast pain, left   [facility] Margarita Mail, DO   1 month ago COVID-19   [facility] Margarita Mail, DO   2 months ago Hemiparesis of left nondominant side as late effect of cerebral infarction [facility])   [facility] Alba Cory, MD   6 months ago Mild episode of recurrent major depressive disorder [facility])   [facility] Alba Cory, MD   8 months ago Seizure-like activity [facility])   [facility] Alba Cory, MD      Future Appointments            In 1 week Alba Cory, MD [facility], [facility]   In 3 weeks Debbe Odea, MD [facility], [facility]   In 4 months Duke Salvia, MD [facility], [facility]

## 2021-03-07 ENCOUNTER — Ambulatory Visit: Payer: BC Managed Care – PPO | Admitting: Dermatology

## 2021-03-09 ENCOUNTER — Ambulatory Visit
Admission: RE | Admit: 2021-03-09 | Discharge: 2021-03-09 | Disposition: A | Discharge status: Home / Self Care | Payer: BC Managed Care – PPO | Source: Ambulatory Visit | Attending: Internal Medicine | Admitting: Internal Medicine

## 2021-03-09 DIAGNOSIS — R590 Localized enlarged lymph nodes: Secondary | ICD-10-CM

## 2021-03-09 DIAGNOSIS — R221 Localized swelling, mass and lump, neck: Secondary | ICD-10-CM | POA: Diagnosis not present

## 2021-03-12 ENCOUNTER — Telehealth: Payer: Self-pay

## 2021-03-12 DIAGNOSIS — I6930 Unspecified sequelae of cerebral infarction: Secondary | ICD-10-CM | POA: Diagnosis not present

## 2021-03-12 NOTE — Telephone Encounter (Signed)
Copied from [place] [id number]. Topic: General - Other >> Mar 09, 2021  4:37 PM Alicia [name] wrote: Reason for CRM: Pt stated she is down to her last pill and she was told a prior authorization is needed for PARoxetine (PAXIL-CR) 25 MG 24 hr tablet. Cb# [phone]
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
## 2021-03-15 ENCOUNTER — Other Ambulatory Visit: Payer: Self-pay

## 2021-03-15 ENCOUNTER — Encounter: Payer: Self-pay | Admitting: Family Medicine

## 2021-03-15 ENCOUNTER — Ambulatory Visit: Payer: BC Managed Care – PPO | Admitting: Family Medicine

## 2021-03-15 VITALS — BP 118/72 | HR 106 | Temp 98.2°F | Resp 16 | Ht 68.0 in | Wt 184.0 lb

## 2021-03-15 DIAGNOSIS — M542 Cervicalgia: Secondary | ICD-10-CM

## 2021-03-15 DIAGNOSIS — U099 Post covid-19 condition, unspecified: Secondary | ICD-10-CM

## 2021-03-15 DIAGNOSIS — R569 Unspecified convulsions: Secondary | ICD-10-CM | POA: Diagnosis not present

## 2021-03-15 DIAGNOSIS — G8929 Other chronic pain: Secondary | ICD-10-CM

## 2021-03-15 DIAGNOSIS — J683 Other acute and subacute respiratory conditions due to chemicals, gases, fumes and vapors: Secondary | ICD-10-CM

## 2021-03-15 DIAGNOSIS — F5104 Psychophysiologic insomnia: Secondary | ICD-10-CM | POA: Diagnosis not present

## 2021-03-15 DIAGNOSIS — M310 Hypersensitivity angiitis: Secondary | ICD-10-CM

## 2021-03-15 DIAGNOSIS — F411 Generalized anxiety disorder: Secondary | ICD-10-CM

## 2021-03-15 DIAGNOSIS — G43009 Migraine without aura, not intractable, without status migrainosus: Secondary | ICD-10-CM

## 2021-03-15 DIAGNOSIS — F330 Major depressive disorder, recurrent, mild: Secondary | ICD-10-CM

## 2021-03-15 DIAGNOSIS — I69354 Hemiplegia and hemiparesis following cerebral infarction affecting left non-dominant side: Secondary | ICD-10-CM

## 2021-03-15 DIAGNOSIS — G9332 Myalgic encephalomyelitis/chronic fatigue syndrome: Secondary | ICD-10-CM

## 2021-03-15 DIAGNOSIS — E785 Hyperlipidemia, unspecified: Secondary | ICD-10-CM

## 2021-03-15 DIAGNOSIS — G4733 Obstructive sleep apnea (adult) (pediatric): Secondary | ICD-10-CM

## 2021-03-15 DIAGNOSIS — R21 Rash and other nonspecific skin eruption: Secondary | ICD-10-CM

## 2021-03-15 DIAGNOSIS — R7303 Prediabetes: Secondary | ICD-10-CM

## 2021-03-15 MED ORDER — ALPRAZOLAM ER 1 MG PO TB24: 1.0000 mg | ORAL_TABLET | ORAL | 0 refills | Status: DC

## 2021-03-15 MED ORDER — PIMECROLIMUS 1 % EX CREA: TOPICAL_CREAM | Freq: Two times a day (BID) | CUTANEOUS | 0 refills | Status: DC

## 2021-03-15 MED ORDER — CYCLOBENZAPRINE HCL 10 MG PO TABS: ORAL_TABLET | ORAL | 1 refills | Status: DC

## 2021-03-15 MED ORDER — ZOLPIDEM TARTRATE ER 12.5 MG PO TBCR: 12.5000 mg | EXTENDED_RELEASE_TABLET | Freq: Every evening | ORAL | 0 refills | Status: DC | PRN

## 2021-03-15 MED ORDER — PAROXETINE HCL ER 25 MG PO TB24: ORAL_TABLET | ORAL | 1 refills | Status: DC

## 2021-03-15 MED ORDER — ICOSAPENT ETHYL 1 G PO CAPS: ORAL_CAPSULE | ORAL | 1 refills | Status: DC

## 2021-03-19 ENCOUNTER — Encounter: Payer: Self-pay | Admitting: Family Medicine

## 2021-03-19 ENCOUNTER — Other Ambulatory Visit: Payer: Self-pay

## 2021-03-19 ENCOUNTER — Ambulatory Visit: Payer: BC Managed Care – PPO | Admitting: Dermatology

## 2021-03-19 DIAGNOSIS — N644 Mastodynia: Secondary | ICD-10-CM

## 2021-03-30 ENCOUNTER — Encounter: Payer: Self-pay | Admitting: Cardiology

## 2021-03-30 ENCOUNTER — Other Ambulatory Visit: Payer: Self-pay

## 2021-03-30 ENCOUNTER — Telehealth: Payer: Self-pay | Admitting: Cardiology

## 2021-03-30 ENCOUNTER — Ambulatory Visit: Payer: BC Managed Care – PPO | Admitting: Cardiology

## 2021-03-30 VITALS — BP 110/60 | HR 111 | Ht 68.0 in | Wt 190.0 lb

## 2021-03-30 DIAGNOSIS — E785 Hyperlipidemia, unspecified: Secondary | ICD-10-CM

## 2021-03-30 DIAGNOSIS — R000 Tachycardia, unspecified: Secondary | ICD-10-CM

## 2021-03-30 DIAGNOSIS — G901 Familial dysautonomia [Riley-Day]: Secondary | ICD-10-CM

## 2021-03-30 DIAGNOSIS — E782 Mixed hyperlipidemia: Secondary | ICD-10-CM | POA: Diagnosis not present

## 2021-03-30 DIAGNOSIS — I639 Cerebral infarction, unspecified: Secondary | ICD-10-CM

## 2021-03-30 MED ORDER — SODIUM CHLORIDE 1 G PO TABS: 1.0000 g | ORAL_TABLET | Freq: Three times a day (TID) | ORAL | 5 refills | Status: DC

## 2021-03-30 MED ORDER — DIGOXIN 125 MCG PO TABS: 0.1250 mg | ORAL_TABLET | Freq: Every day | ORAL | 5 refills | Status: DC

## 2021-03-30 MED ORDER — ICOSAPENT ETHYL 1 G PO CAPS: 2.0000 g | ORAL_CAPSULE | Freq: Two times a day (BID) | ORAL | 5 refills | Status: DC

## 2021-03-30 MED ORDER — MIDODRINE HCL 5 MG PO TABS: ORAL_TABLET | ORAL | 5 refills | Status: DC

## 2021-03-30 NOTE — Telephone Encounter (Signed)
Spoke with patient and she is aware that we have left hard copy lab slips at the front desk for pick up.

## 2021-03-30 NOTE — Progress Notes (Signed)
Cardiology Office Note:    Date:  03/30/2021   ID:  Alicia Guadeloupeorothy Van Note Velna HatchetKuntz, DOB Jul 22, 1970, MRN [id number]  PCP:  [name], [name], MD  [facility] Cardiologist:  Debbe OdeaBrian Agbor-Etang, MD  [facility] Electrophysiologist:  None   Referring MD: [name], [name], MD   Chief Complaint  Patient presents with   Other    3 month follow up -- Patient c./o SOB. Meds reviewed verbally with patient.      History of Present Illness:    Alicia GuadeloupeDorothy Van Note [name] is a 51 y.o. female with a hx of anxiety, hyperlipidemia, orthostasis, inappropriate sinus tach, CVA, TIA who presents for follow-up.    Being seen for hyperlipidemia, dysautonomia/orthostasis.  Previously did not tolerate beta-blocker for inappropriate sinus tachycardia.previously on digoxin 0.25 mg daily, dig was stopped due to elevated BG levels..  Insurance did not approve ivabradine.  She states feeling better while on digoxin, her heart rates were in the 90s.  Her current heart rates are averaging around 110 bpm.  States having palpitations, dizziness.  ILR was removed, no evidence of A-fib noted.   Prior notes Patient first diagnosed with strokes in 2018.  At that time, she had dizziness, nausea, left-sided weakness.  Also has a history of seizures.  Had another episode of CVA in 2019 with left-sided arm and leg weakness.  Loop recorder was placed in 2018, last checked a month ago without any evidence of A. fib or flutter.  Also states having orthostasis for the past couple of years, and tachycardia.  Was placed on Coreg to help with heart rate control, but this made her orthostasis worse.  Coreg was stopped, her blood pressures improved, but she still gets dizzy when standing for long.  Sometimes, associated with visual issues, and syncope.  Patient was diagnosed with COVID-19 pneumonia on 01/2020 after presenting to the hospital with shortness of breath.  Chest x-ray showed multifocal pneumonia.  Managed with IV infusion,  remdesivir.  Echocardiogram (02/2020 showed normal systolic function, EF 60-65, impaired relaxation.  Past Medical History:  Diagnosis Date   Anemia    Anxiety    Depression    Dyspnea    SINCE STROKE   GERD (gastroesophageal reflux disease)    Hyperlipidemia    Hypertension    IBS (irritable bowel syndrome)    Migraine    Previous cesarean delivery, delivered, with or without mention of antepartum condition 01/19/2012   Psychogenic nonepileptic seizure    hx/notes 12/19/2016-LAST SEIZURE IN 2019-ON NO MEDS AS OF 05-24-19   Restless leg syndrome    Sleep apnea    USES CPAP   Stroke (HCC) 12/18/2016   Acute arterial ischemic stroke, multifocal, posterior circulation /notes 12/19/2016-LEFT SIDED WEAKNESS, MEMORY FATIGUE AND TROUBLE F[facility]    Past Surgical History:  Procedure Laterality Date   ABDOMINOPLASTY  Feb. 2015   CESAREAN SECTION  2011   CHOLECYSTECTOMY N/A 05/31/2019   Procedure: LAPAROSCOPIC CHOLECYSTECTOMY WITH INTRAOPERATIVE CHOLANGIOGRAM;  Surgeon: Earline MayotteByrnett, Jeffrey W, MD;  Location: [facility];  Service: General;  Laterality: N/A;   COLONOSCOPY WITH PROPOFOL N/A 11/26/2016   Procedure: COLONOSCOPY WITH PROPOFOL;  Surgeon: Wyline MoodAnna, Kiran, MD;  [facility];  Service: Gastroenterology;  Laterality: N/A;   COLONOSCOPY WITH PROPOFOL N/A 08/08/2020   Procedure: COLONOSCOPY WITH PROPOFOL;  Surgeon: Regis BillLocklear, Cameron T, MD;  Location: [facility];  Service: Endoscopy;  Laterality: N/A;   CYSTOSCOPY  02/02/2015   Procedure: CYSTOSCOPY;  Surgeon: Nadara Mustardobert P Harris, MD;  Location: [facility];  Service: Gynecology;;   DILATION AND CURETTAGE OF UTERUS  2003, 2005, 2008   ESOPHAGOGASTRODUODENOSCOPY N/A 05/31/2019   Procedure: ESOPHAGOGASTRODUODENOSCOPY (EGD);  Surgeon: Robert Bellow, MD;  Location: [facility];  Service: General;  Laterality: N/A;  in the O.R.   ESOPHAGOGASTRODUODENOSCOPY (EGD) WITH PROPOFOL N/A 11/26/2016   Procedure: ESOPHAGOGASTRODUODENOSCOPY  (EGD) WITH PROPOFOL;  Surgeon: Jonathon Bellows, MD;  [facility];  Service: Gastroenterology;  Laterality: N/A;   HERNIA REPAIR  [id number]   Umbilical   KNEE ARTHROSCOPY Right 2006   LAPAROSCOPIC BILATERAL SALPINGECTOMY Bilateral 02/02/2015   Procedure: LAPAROSCOPIC BILATERAL SALPINGECTOMY;  Surgeon: Gae Dry, MD;  Location: [facility];  Service: Gynecology;  Laterality: Bilateral;   LAPAROSCOPIC HYSTERECTOMY N/A 02/02/2015   Procedure: HYSTERECTOMY TOTAL LAPAROSCOPIC;  Surgeon: Gae Dry, MD;  Location: [facility];  Service: Gynecology;  Laterality: N/A;   LOOP RECORDER INSERTION N/A 12/20/2016   Procedure: LOOP RECORDER INSERTION;  Surgeon: Constance Haw, MD;  Location: [facility] [facility];  Service: Cardiovascular;  Laterality: N/A;   REDUCTION MAMMAPLASTY Bilateral December 2015   TEE WITHOUT CARDIOVERSION N/A 12/20/2016   Procedure: TRANSESOPHAGEAL ECHOCARDIOGRAM (TEE);  Surgeon: Kelda Spark, MD;  Location: [place];  Service: Cardiovascular;  Laterality: N/A;   TEE WITHOUT CARDIOVERSION N/A 03/29/2020   Procedure: TRANSESOPHAGEAL ECHOCARDIOGRAM (TEE);  Surgeon: Kate Sable, MD;  Location: [facility];  Service: Cardiovascular;  Laterality: N/A;    Current Medications: Current Meds  Medication Sig   ALPRAZolam (XANAX XR) 1 MG 24 hr tablet Take 1 tablet (1 mg total) by mouth every morning.   clopidogrel (PLAVIX) 75 MG tablet Take 75 mg by mouth daily.   cyclobenzaprine (FLEXERIL) 10 MG tablet TAKE 1 TABLET(10 MG) BY MOUTH AT BEDTIME   digoxin (LANOXIN) 0.125 MG tablet Take 1 tablet (0.125 mg total) by mouth daily.   fluticasone furoate-vilanterol (BREO ELLIPTA) 100-25 MCG/INH AEPB Inhale 1 puff into the lungs daily.   lamoTRIgine (LAMICTAL) 100 MG tablet Take 100 mg by mouth 2 (two) times daily.   pantoprazole (PROTONIX) 40 MG tablet Take 1 tablet (40 mg total) by mouth every morning.   PARoxetine (PAXIL-CR) 25 MG 24 hr tablet TAKE 2 TABLETS(50 MG) BY  MOUTH DAILY   pimecrolimus (ELIDEL) 1 % cream Apply topically 2 (two) times daily.   rosuvastatin (CRESTOR) 40 MG tablet Take 1 tablet (40 mg total) by mouth daily. In place of Atorvastatin   sodium chloride 1 g tablet Take 1 tablet (1 g total) by mouth 3 (three) times daily.   UBRELVY 100 MG TABS Take 100 mg by mouth daily as needed (migraine).    Vitamin D, Ergocalciferol, (DRISDOL) 1.25 MG (50000 UNIT) CAPS capsule Take 1 capsule (50,000 Units total) by mouth every 7 (seven) days.   zolpidem (AMBIEN CR) 12.5 MG CR tablet Take 1 tablet (12.5 mg total) by mouth at bedtime as needed for sleep.   [DISCONTINUED] icosapent Ethyl (VASCEPA) 1 g capsule TAKE 1 CAPSULE BY MOUTH EVERY MORNING, AT NOON, AND AT BEDTIME   [DISCONTINUED] midodrine (PROAMATINE) 5 MG tablet Take 1 tablet (5 mg) by mouth three times a day at 7 am, 11 am, & 3 pm     Allergies:   Azithromycin and Penicillins   Social History   Socioeconomic History   Marital status: Married    Spouse name: John    Number of children: 4   Years of education: Not on file   Highest education level: Not on file  Occupational History  Occupation: Aeronautical engineer: [employer]  Tobacco Use   Smoking status: Never   Smokeless tobacco: Never  Vaping Use   Vaping Use: Never used  Substance and Sexual Activity   Alcohol use: No    Alcohol/week: 0.0 standard drinks   Drug use: No   Sexual activity: Yes    Partners: Male  Other Topics Concern   Not on file  Social History Narrative   First husband died suddenly in 06-21-14   Remarried 06/07/2016   Social Determinants of Health   Financial Resource Strain: Not on file  Food Insecurity: Not on file  Transportation Needs: Not on file  Physical Activity: Not on file  Stress: Not on file  Social Connections: Not on file     Family History: The patient's family history includes Heart Problems in her father; Hyperlipidemia in her mother; Hypertension in her mother; Prostate  cancer in her maternal grandfather; Von Willebrand disease in her maternal uncle.  ROS:   Please see the history of present illness.     All other systems reviewed and are negative.  EKGs/Labs/Other Studies Reviewed:    The following studies were reviewed today:   EKG:  EKG is  ordered today.  EKG shows sinus tachycardia, heart rate 111.  Recent Labs: 10/30/2020: ALT 25; BUN 20; Creatinine, Ser 1.01; Hemoglobin 15.0; Platelets 396; Potassium 4.4; Sodium 145; TSH 3.400  Recent Lipid Panel    Component Value Date/Time   CHOL 218 (H) 10/30/2020 0800   TRIG 673 (HH) 10/30/2020 0800   HDL 34 (L) 10/30/2020 0800   CHOLHDL 6.4 (H) 10/30/2020 0800   CHOLHDL 5.7 (H) 05/04/2019 0929   VLDL 76 (H) 12/19/2016 0009   LDLCALC 77 10/30/2020 0800   LDLCALC 118 (H) 05/04/2019 0929     Risk Assessment/Calculations:      Physical Exam:    VS:  BP 110/60 (BP Location: Left Arm, Patient Position: Sitting, Cuff Size: Large)    Pulse (!) 111    Ht 5\' 8"  (1.727 m)    Wt 190 lb (86.2 kg)    LMP 01/15/2015 (Exact Date)    SpO2 95%    BMI 28.89 kg/m     Wt Readings from Last 3 Encounters:  03/30/21 190 lb (86.2 kg)  03/15/21 184 lb (83.5 kg)  02/27/21 187 lb (84.8 kg)     GEN:  Well nourished, well developed in no acute distress HEENT: Normal NECK: No JVD; No carotid bruits LYMPHATICS: No lymphadenopathy CARDIAC: Tachycardic, no murmurs, rubs, gallops RESPIRATORY:  Clear to auscultation without rales, wheezing or rhonchi  ABDOMEN: Soft, non-tender, non-distended MUSCULOSKELETAL:  No edema; No deformity  SKIN: Warm and dry NEUROLOGIC:  Alert and oriented x 3 PSYCHIATRIC:  Normal affect   ASSESSMENT:    1. Cryptogenic stroke ([place])   2. Inappropriate sinus tachycardia   3. Dysautonomia ([place])   4. Mixed hyperlipidemia   5. Dyslipidemia      PLAN:    In order of problems listed above:  Patient with multiple strokes, TIA in the past.  ILR with no evidence for A. fib or flutter.   TEE with no shunting.  Continue aspirin, Lipitor. Inappropriate sinus tach, she failed management with beta-blockers due to worsening orthostasis.  Ivabradine not covered by insurance.  Heart rates in the 110s.  Start digoxin 0.125 mg daily as this helped patient previously. History of orthostasis, dizziness upon standing from seated position.  Start salt tablets 1 tab 3 times daily.  Continue midodrine 5 mg 3 times daily. Hyperlipidemia, hypertriglyceridemia, increase Vascepa to 2 g twice daily, continue Crestor 40 mg daily.  Repeat lipid panel in 2 months.  If triglycerides still elevated, plan to add fenofibrate.  Follow-up in 2 months.     Medication Adjustments/Labs and Tests Ordered: Current medicines are reviewed at length with the patient today.  Concerns regarding medicines are outlined above.  Orders Placed This Encounter  Procedures   Digoxin level   Lipid panel   EKG 12-Lead    Meds ordered this encounter  Medications   icosapent Ethyl (VASCEPA) 1 g capsule    Sig: Take 2 capsules (2 g total) by mouth 2 (two) times daily. TAKE 1 CAPSULE BY MOUTH EVERY MORNING, AT NOON, AND AT BEDTIME    Dispense:  60 capsule    Refill:  5   digoxin (LANOXIN) 0.125 MG tablet    Sig: Take 1 tablet (0.125 mg total) by mouth daily.    Dispense:  30 tablet    Refill:  5   sodium chloride 1 g tablet    Sig: Take 1 tablet (1 g total) by mouth 3 (three) times daily.    Dispense:  90 tablet    Refill:  5   midodrine (PROAMATINE) 5 MG tablet    Sig: Take 1 tablet (5 mg) by mouth three times a day at 7 am, 11 am, & 3 pm    Dispense:  90 tablet    Refill:  5     Patient Instructions  Medication Instructions:   Your physician has recommended you make the following change in your medication:    START taking Digoxin 0.125 MG once a day.  2.    START taking Sodium Chloride 1 GM three times a day.  3.    INCREASE your Vascepa to 2 GM twice a day.   *If you need a refill on your cardiac  medications before your next appointment, please call your pharmacy*   Lab Work:  Get a Digoxin level drawn in 1 week (Friday 04/06/21) . A lab slip will be left for you at our front desk to take to your lab as requested.  2.   Get a Fasting Lipid panel the week prior to your 2 month follow up.  A lab slip will be left for you at our front    desk to take to your lab as requested.  - You will need to be fasting. Please do not have anything to eat or drink after midnight the morning you have the lab work. You may only have water or black coffee with no cream or sugar.     Testing/Procedures:  None ordered   Follow-Up: At [facility], you and your health needs are our priority.  As part of our continuing mission to provide you with exceptional heart care, we have created designated Provider Care Teams.  These Care Teams include your primary Cardiologist (physician) and Advanced Practice Providers (APPs -  Physician Assistants and Nurse Practitioners) who all work together to provide you with the care you need, when you need it.  We recommend signing up for the patient portal called "MyChart".  Sign up information is provided on this After Visit Summary.  MyChart is used to connect with patients for Virtual Visits (Telemedicine).  Patients are able to view lab/test results, encounter notes, upcoming appointments, etc.  Non-urgent messages can be sent to your provider as well.   To learn more about what  you can do with MyChart, go to [email or web address].    Your next appointment:   2 month(s)  The format for your next appointment:   In Person  Provider:    Only With Dr. Garen Lah   Other Instructions     Signed, Kate Sable, MD  03/30/2021 1:04 PM    [place]

## 2021-03-30 NOTE — Patient Instructions (Signed)
Medication Instructions:   Your physician has recommended you make the following change in your medication:    START taking Digoxin 0.125 MG once a day.  2.    START taking Sodium Chloride 1 GM three times a day.  3.    INCREASE your Vascepa to 2 GM twice a day.   *If you need a refill on your cardiac medications before your next appointment, please call your pharmacy*   Lab Work:  Get a Digoxin level drawn in 1 week (Friday 04/06/21) . A lab slip will be left for you at our front desk to take to your lab as requested.  2.   Get a Fasting Lipid panel the week prior to your 2 month follow up.  A lab slip will be left for you at our front    desk to take to your lab as requested.  - You will need to be fasting. Please do not have anything to eat or drink after midnight the morning you have the lab work. You may only have water or black coffee with no cream or sugar.     Testing/Procedures:  None ordered   Follow-Up: At [facility], you and your health needs are our priority.  As part of our continuing mission to provide you with exceptional heart care, we have created designated Provider Care Teams.  These Care Teams include your primary Cardiologist (physician) and Advanced Practice Providers (APPs -  Physician Assistants and Nurse Practitioners) who all work together to provide you with the care you need, when you need it.  We recommend signing up for the patient portal called "MyChart".  Sign up information is provided on this After Visit Summary.  MyChart is used to connect with patients for Virtual Visits (Telemedicine).  Patients are able to view lab/test results, encounter notes, upcoming appointments, etc.  Non-urgent messages can be sent to your provider as well.   To learn more about what you can do with MyChart, go to [email or web address].    Your next appointment:   2 month(s)  The format for your next appointment:   In Person  Provider:    Only With Dr.  Azucena Cecil   Other Instructions

## 2021-03-30 NOTE — Telephone Encounter (Signed)
Patient declined labs in office and wants to pick up paper copy to do elsewhere .

## 2021-03-30 NOTE — Progress Notes (Signed)
Cardiology Office Note:    Date:  03/30/2021   ID:  Alicia [name], Alicia [name] 30-Oct-1970, MRN [id number]  PCP:  Steele Sizer, MD  [facility] Cardiologist:  Kate Sable, MD  [place] Electrophysiologist:  None   Referring MD: Steele Sizer, MD   Chief Complaint  Patient presents with   Other    3 month follow up -- Patient c./o SOB. Meds reviewed verbally with patient.      History of Present Illness:    Alicia [name] is a 51 y.o. female with a hx of anxiety, hyperlipidemia, orthostasis, inappropriate sinus tach, CVA, TIA who presents for follow-up.    Being seen for hyperlipidemia, dysautonomia/orthostasis.  Previously did not tolerate beta-blocker for inappropriate sinus tachycardia, digoxin also stopped.  Insurance did not approve ivabradine.  Last seen for orthostasis and inappropriate sinus tachycardia.  Work-up for prior TIA/cryptogenic stroke was unrevealing from a cardiac perspective.  She was started on digoxin to help with heart rate control.  Insurance will not approve ivabradine.  Started on midodrine to help with blood pressures.  She states her symptoms have improved, although she occasionally gets dizzy but not as bad as before.  Heart rates have stayed in the 100s to 115.  Had an ILR placed with interrogation showing no evidence of A. fib or flutter.  Recommended repeat fasting lipid profile, patient forgot to have drawn.   Prior notes Patient first diagnosed with strokes in 2018.  At that time, she had dizziness, nausea, left-sided weakness.  Also has a history of seizures.  Had another episode of CVA in 2019 with left-sided arm and leg weakness.  Loop recorder was placed in 2018, last checked a month ago without any evidence of A. fib or flutter.  Also states having orthostasis for the past couple of years, and tachycardia.  Was placed on Coreg to help with heart rate control, but this made her orthostasis worse.  Coreg was stopped, her  blood pressures improved, but she still gets dizzy when standing for long.  Sometimes, associated with visual issues, and syncope.  Patient was diagnosed with COVID-19 pneumonia on 01/2020 after presenting to the hospital with shortness of breath.  Chest x-ray showed multifocal pneumonia.  Managed with IV infusion, remdesivir.  Echocardiogram (02/2020 showed normal systolic function, EF [id number], impaired relaxation.  Past Medical History:  Diagnosis Date   Anemia    Anxiety    Depression    Dyspnea    SINCE STROKE   GERD (gastroesophageal reflux disease)    Hyperlipidemia    Hypertension    IBS (irritable bowel syndrome)    Migraine    Previous cesarean delivery, delivered, with or without mention of antepartum condition 01/19/2012   Psychogenic nonepileptic seizure    hx/notes 12/19/2016-LAST SEIZURE IN 2019-ON NO MEDS AS OF 05-24-19   Restless leg syndrome    Sleep apnea    USES CPAP   Stroke ([place]) 12/18/2016   Acute arterial ischemic stroke, multifocal, posterior circulation /notes 12/19/2016-LEFT SIDED WEAKNESS, MEMORY FATIGUE AND TROUBLE FINDING HER WORDS [facility]    Past Surgical History:  Procedure Laterality Date   ABDOMINOPLASTY  Feb. 2015   CESAREAN SECTION  2011   CHOLECYSTECTOMY N/A 05/31/2019   Procedure: LAPAROSCOPIC CHOLECYSTECTOMY WITH INTRAOPERATIVE CHOLANGIOGRAM;  Surgeon: Robert Bellow, MD;  Location: [facility] ORS;  Service: General;  Laterality: N/A;   COLONOSCOPY WITH PROPOFOL N/A 11/26/2016   Procedure: COLONOSCOPY WITH PROPOFOL;  Surgeon: Jonathon Bellows, MD;  Location: [facility] ENDOSCOPY;  Service: Gastroenterology;  Laterality: N/A;   COLONOSCOPY WITH PROPOFOL N/A 08/08/2020   Procedure: COLONOSCOPY WITH PROPOFOL;  Surgeon: Lesly Rubenstein, MD;  Location: [facility] ENDOS[facility];  Service: Endoscopy;  Laterality: N/A;   CYSTOSCOPY  02/02/2015   Procedure: CYSTOSCOPY;  Surgeon: Gae Dry, MD;  Location: [facility] OR[facility];  Service: Gynecology;;   DILATION AND CURETTAGE OF UTERUS   2003, 2005, 2008   ESOPHAGOGASTRODUODENOSCOPY N/A 05/31/2019   Procedure: ESOPHAGOGASTRODUODENOSCOPY (EGD);  Surgeon: Robert Bellow, MD;  Location: [facility] OR[facility];  Service: General;  Laterality: N/A;  in the O.R.   ESOPHAGOGASTRODUODENOSCOPY (EGD) WITH PROPOFOL N/A 11/26/2016   Procedure: ESOPHAGOGASTRODUODENOSCOPY (EGD) WITH PROPOFOL;  Surgeon: Jonathon Bellows, MD;  Location: [facility] END[facility]COPY;  Service: Gastroenterology;  Laterality: N/A;   HERNIA REPAIR  [id number]   Umbilical   KNEE ARTHROSCOPY Right 2006   LAPAROSCOPIC BILATERAL SALPINGECTOMY Bilateral 02/02/2015   Procedure: LAPAROSCOPIC BILATERAL SALPINGECTOMY;  Surgeon: Gae Dry, MD;  Location: [facility] OR[facility];  Service: Gynecology;  Laterality: Bilateral;   LAPAROSCOPIC HYSTERECTOMY N/A 02/02/2015   Procedure: HYSTERECTOMY TOTAL LAPAROSCOPIC;  Surgeon: Gae Dry, MD;  Location: [facility];  Service: Gynecology;  Laterality: N/A;   LOOP RECORDER INSERTION N/A 12/20/2016   Procedure: LOOP RECORDER INSERTION;  Surgeon: Constance Haw, MD;  Location: [place] [facility];  Service: Cardiovascular;  Laterality: N/A;   REDUCTION MAMMAPLASTY Bilateral December 2015   TEE WITHOUT CARDIOVERSION N/A 12/20/2016   Procedure: TRANSESOPHAGEAL ECHOCARDIOGRAM (TEE);  Surgeon: Aveya Spark, MD;  Location: [place];  Service: Cardiovascular;  Laterality: N/A;   TEE WITHOUT CARDIOVERSION N/A 03/29/2020   Procedure: TRANSESOPHAGEAL ECHOCARDIOGRAM (TEE);  Surgeon: Kate Sable, MD;  Location: [facility];  Service: Cardiovascular;  Laterality: N/A;    Current Medications: Current Meds  Medication Sig   ALPRAZolam (XANAX XR) 1 MG 24 hr tablet Take 1 tablet (1 mg total) by mouth every morning.   clopidogrel (PLAVIX) 75 MG tablet Take 75 mg by mouth daily.   cyclobenzaprine (FLEXERIL) 10 MG tablet TAKE 1 TABLET(10 MG) BY MOUTH AT BEDTIME   digoxin (LANOXIN) 0.125 MG tablet Take 1 tablet (0.125 mg total) by mouth daily.   fluticasone furoate-vilanterol  (BREO ELLIPTA) 100-25 MCG/INH AEPB Inhale 1 puff into the lungs daily.   lamoTRIgine (LAMICTAL) 100 MG tablet Take 100 mg by mouth 2 (two) times daily.   pantoprazole (PROTONIX) 40 MG tablet Take 1 tablet (40 mg total) by mouth every morning.   PARoxetine (PAXIL-CR) 25 MG 24 hr tablet TAKE 2 TABLETS(50 MG) BY MOUTH DAILY   pimecrolimus (ELIDEL) 1 % cream Apply topically 2 (two) times daily.   rosuvastatin (CRESTOR) 40 MG tablet Take 1 tablet (40 mg total) by mouth daily. In place of Atorvastatin   sodium chloride 1 g tablet Take 1 tablet (1 g total) by mouth 3 (three) times daily.   UBRELVY 100 MG TABS Take 100 mg by mouth daily as needed (migraine).    Vitamin D, Ergocalciferol, (DRISDOL) 1.25 MG (50000 UNIT) CAPS capsule Take 1 capsule (50,000 Units total) by mouth every 7 (seven) days.   zolpidem (AMBIEN CR) 12.5 MG CR tablet Take 1 tablet (12.5 mg total) by mouth at bedtime as needed for sleep.   [DISCONTINUED] icosapent Ethyl (VASCEPA) 1 g capsule TAKE 1 CAPSULE BY MOUTH EVERY MORNING, AT NOON, AND AT BEDTIME   [DISCONTINUED] midodrine (PROAMATINE) 5 MG tablet Take 1 tablet (5 mg) by mouth three times a day at 7 am, 11 am, & 3 pm  Allergies:   Azithromycin and Penicillins   Social History   Socioeconomic History   Marital status: Married    Spouse name: John    Number of children: 4   Years of education: Not on file   Highest education level: Not on file  Occupational History   Occupation: Aeronautical engineer: [employer]  Tobacco Use   Smoking status: Never   Smokeless tobacco: Never  Vaping Use   Vaping Use: Never used  Substance and Sexual Activity   Alcohol use: No    Alcohol/week: 0.0 standard drinks   Drug use: No   Sexual activity: Yes    Partners: Male  Other Topics Concern   Not on file  Social History Narrative   First husband died suddenly in 06/22/2014   Remarried 06/07/2016   Social Determinants of Health   Financial Resource Strain: Not on file   Food Insecurity: Not on file  Transportation Needs: Not on file  Physical Activity: Not on file  Stress: Not on file  Social Connections: Not on file     Family History: The patient's family history includes Heart Problems in her father; Hyperlipidemia in her mother; Hypertension in her mother; Prostate cancer in her maternal grandfather; Von Willebrand disease in her maternal uncle.  ROS:   Please see the history of present illness.     All other systems reviewed and are negative.  EKGs/Labs/Other Studies Reviewed:    The following studies were reviewed today:   EKG:  EKG is  ordered today.  EKG shows sinus rhythm, nonspecific ST changes.  Recent Labs: 10/30/2020: ALT 25; BUN 20; Creatinine, Ser 1.01; Hemoglobin 15.0; Platelets 396; Potassium 4.4; Sodium 145; TSH 3.400  Recent Lipid Panel    Component Value Date/Time   CHOL 218 (H) 10/30/2020 0800   TRIG 673 (HH) 10/30/2020 0800   HDL 34 (L) 10/30/2020 0800   CHOLHDL 6.4 (H) 10/30/2020 0800   CHOLHDL 5.7 (H) 05/04/2019 0929   VLDL 76 (H) 12/19/2016 0009   LDLCALC 77 10/30/2020 0800   LDLCALC 118 (H) 05/04/2019 0929     Risk Assessment/Calculations:      Physical Exam:    VS:  BP 110/60 (BP Location: Left Arm, Patient Position: Sitting, Cuff Size: Large)    Pulse (!) 111    Ht 5\' 8"  (1.727 m)    Wt 190 lb (86.2 kg)    LMP 01/15/2015 (Exact Date)    SpO2 95%    BMI 28.89 kg/m     Wt Readings from Last 3 Encounters:  03/30/21 190 lb (86.2 kg)  03/15/21 184 lb (83.5 kg)  02/27/21 187 lb (84.8 kg)     GEN:  Well nourished, well developed in no acute distress HEENT: Normal NECK: No JVD; No carotid bruits LYMPHATICS: No lymphadenopathy CARDIAC: Tachycardic, no murmurs, rubs, gallops RESPIRATORY:  Clear to auscultation without rales, wheezing or rhonchi  ABDOMEN: Soft, non-tender, non-distended MUSCULOSKELETAL:  No edema; No deformity  SKIN: Warm and dry NEUROLOGIC:  Alert and oriented x 3 PSYCHIATRIC:  Normal  affect   ASSESSMENT:    1. Cryptogenic stroke ([place])   2. Inappropriate sinus tachycardia   3. Dysautonomia ([place])   4. Mixed hyperlipidemia   5. Dyslipidemia      PLAN:    In order of problems listed above:  Patient with multiple strokes, TIA in the past.  ILR with no evidence for A. fib or flutter.  TEE with no shunting.  Continue aspirin, Lipitor. Inappropriate  sinus tach, she failed management with beta-blockers due to worsening orthostasis.  Ivabradine not covered by insurance.  Heart rates in the 100s to low 1 teens.  Increase digoxin to 0.25 mg daily.  Check dig levels. History of orthostasis, dizziness upon standing from seated position.  Symptoms overall improved.  Continue midodrine 5 mg 3 times daily. Hyperlipidemia, hypertriglyceridemia, increase Vascepa to 2 g twice daily, continue Crestor 40 mg daily.   Follow-up in 3 months.     Medication Adjustments/Labs and Tests Ordered: Current medicines are reviewed at length with the patient today.  Concerns regarding medicines are outlined above.  Orders Placed This Encounter  Procedures   Digoxin level   Lipid panel   EKG 12-Lead    Meds ordered this encounter  Medications   icosapent Ethyl (VASCEPA) 1 g capsule    Sig: Take 2 capsules (2 g total) by mouth 2 (two) times daily. TAKE 1 CAPSULE BY MOUTH EVERY MORNING, AT NOON, AND AT BEDTIME    Dispense:  60 capsule    Refill:  5   digoxin (LANOXIN) 0.125 MG tablet    Sig: Take 1 tablet (0.125 mg total) by mouth daily.    Dispense:  30 tablet    Refill:  5   sodium chloride 1 g tablet    Sig: Take 1 tablet (1 g total) by mouth 3 (three) times daily.    Dispense:  90 tablet    Refill:  5   midodrine (PROAMATINE) 5 MG tablet    Sig: Take 1 tablet (5 mg) by mouth three times a day at 7 am, 11 am, & 3 pm    Dispense:  90 tablet    Refill:  5     Patient Instructions  Medication Instructions:   Your physician has recommended you make the following change in  your medication:    START taking Digoxin 0.125 MG once a day.  2.    START taking Sodium Chloride 1 GM three times a day.  3.    INCREASE your Vascepa to 2 GM twice a day.   *If you need a refill on your cardiac medications before your next appointment, please call your pharmacy*   Lab Work:  Get a Digoxin level drawn in 1 week (Friday 04/06/21) . A lab slip will be left for you at our front desk to take to your lab as requested.  2.   Get a Fasting Lipid panel the week prior to your 2 month follow up.  A lab slip will be left for you at our front    desk to take to your lab as requested.  - You will need to be fasting. Please do not have anything to eat or drink after midnight the morning you have the lab work. You may only have water or black coffee with no cream or sugar.     Testing/Procedures:  None ordered   Follow-Up: At [facility], you and your health needs are our priority.  As part of our continuing mission to provide you with exceptional heart care, we have created designated Provider Care Teams.  These Care Teams include your primary Cardiologist (physician) and Advanced Practice Providers (APPs -  Physician Assistants and Nurse Practitioners) who all work together to provide you with the care you need, when you need it.  We recommend signing up for the patient portal called "MyChart".  Sign up information is provided on this After Visit Summary.  MyChart is used to connect  with patients for Virtual Visits (Telemedicine).  Patients are able to view lab/test results, encounter notes, upcoming appointments, etc.  Non-urgent messages can be sent to your provider as well.   To learn more about what you can do with MyChart, go to [email or web address].    Your next appointment:   2 month(s)  The format for your next appointment:   In Person  Provider:    Only With Dr. Garen Lah   Other Instructions     Signed, Kate Sable, MD  03/30/2021 1:04 PM     [place]

## 2021-04-02 ENCOUNTER — Other Ambulatory Visit: Payer: Self-pay

## 2021-04-02 ENCOUNTER — Ambulatory Visit
Admission: RE | Admit: 2021-04-02 | Discharge: 2021-04-02 | Disposition: A | Discharge status: Home / Self Care | Payer: BC Managed Care – PPO | Source: Ambulatory Visit | Attending: Internal Medicine | Admitting: Internal Medicine

## 2021-04-02 DIAGNOSIS — N644 Mastodynia: Secondary | ICD-10-CM

## 2021-04-02 DIAGNOSIS — R590 Localized enlarged lymph nodes: Secondary | ICD-10-CM | POA: Insufficient documentation

## 2021-04-02 DIAGNOSIS — R922 Inconclusive mammogram: Secondary | ICD-10-CM | POA: Diagnosis not present

## 2021-04-04 ENCOUNTER — Telehealth: Payer: Self-pay

## 2021-04-04 ENCOUNTER — Encounter: Payer: Self-pay | Admitting: Cardiology

## 2021-04-04 NOTE — Telephone Encounter (Signed)
Received the following MyChart message from patient:  I was just seen on February 10th and the prescription Midodrine was supposed to be sent to the pharmacy with other medications.  The Midodrine was not filled.  I have no pills to make today's dosage.   Prescription was sent into pharmacy on 03/30/21. Called pharmacy and they did not receive the refill. Gave them a verbal order for 30 day supply with 5 refills. Sent patient a response to her MyChart message.

## 2021-05-16 ENCOUNTER — Other Ambulatory Visit: Payer: Self-pay

## 2021-05-16 ENCOUNTER — Ambulatory Visit (INDEPENDENT_AMBULATORY_CARE_PROVIDER_SITE_OTHER): Payer: BC Managed Care – PPO | Admitting: Dermatology

## 2021-05-16 DIAGNOSIS — L700 Acne vulgaris: Secondary | ICD-10-CM | POA: Diagnosis not present

## 2021-05-16 DIAGNOSIS — L219 Seborrheic dermatitis, unspecified: Secondary | ICD-10-CM

## 2021-05-16 DIAGNOSIS — L710 Perioral dermatitis: Secondary | ICD-10-CM

## 2021-05-16 DIAGNOSIS — L719 Rosacea, unspecified: Secondary | ICD-10-CM | POA: Diagnosis not present

## 2021-05-16 DIAGNOSIS — L309 Dermatitis, unspecified: Secondary | ICD-10-CM

## 2021-05-16 DIAGNOSIS — L738 Other specified follicular disorders: Secondary | ICD-10-CM

## 2021-05-16 DIAGNOSIS — L573 Poikiloderma of Civatte: Secondary | ICD-10-CM

## 2021-05-16 MED ORDER — KETOCONAZOLE 2 % EX CREA: 1.0000 "application " | TOPICAL_CREAM | CUTANEOUS | 6 refills | Status: AC

## 2021-05-16 MED ORDER — DAPSONE 7.5 % EX GEL: 1.0000 "application " | CUTANEOUS | 11 refills | Status: DC

## 2021-05-16 MED ORDER — HYDROCORTISONE 2.5 % EX CREA: TOPICAL_CREAM | CUTANEOUS | 3 refills | Status: DC

## 2021-05-16 NOTE — Progress Notes (Signed)
? ?Follow-Up Visit ?  ?Subjective  ?Alicia [name] Note Kasparek is a 51 y.o. female who presents for the following: check spot (Above L eyebrow, 11m hx, Dry scaly, fall off and recur, using elidel cr bid), hx of Perioral derm with Rosacea and acne (Face, pt needs refills of aczone), and check spots face (Face, had had for months, no symptoms). ?The patient has spots, moles and lesions to be evaluated, some may be new or changing and the patient has concerns that these could be cancer. ? ?The following portions of the chart were reviewed this encounter and updated as appropriate:  ? Tobacco  Allergies  Meds  Problems  Med Hx  Surg Hx  Fam Hx   ?  ?Review of Systems:  No other skin or systemic complaints except as noted in HPI or Assessment and Plan. ? ?Objective  ?Well appearing patient in no apparent distress; mood and affect are within normal limits. ? ?A focused examination was performed including face. Relevant physical exam findings are noted in the Assessment and Plan. ? ?L brow ?Erythema and scale L eyebrow ? ?neck, chest ?Actinic damage neck/chest ? ? ?Assessment & Plan  ?Seborrheic dermatitis ?L brow ? ?Seborrheic Dermatitis  ?-  is a chronic persistent rash characterized by pinkness and scaling most commonly of the mid face but also can occur on the scalp (dandruff), ears; mid chest, mid back and groin.  It tends to be exacerbated by stress and cooler weather.  People who have neurologic disease may experience new onset or exacerbation of existing seborrheic dermatitis.  The condition is not curable but treatable and can be controlled. ? ?May cont Elidel cream prn flares  ?Or may ?Start Ketoconazole 2% cr 3d/wk Monday, Wednesday, Friday ?Start HC 2.5% cr 3d/wk Tuesday, Thursday, Saturday ? ?Discussed if persistent and moves on eyelids may consider TRUE 36 Patch Test ? ?ketoconazole (NIZORAL) 2 % cream - L brow ?Apply 1 application. topically 3 (three) times a week. Apply to scaly areas face 3 times  weekly, Monday, Wednesday, Friday ?hydrocortisone 2.5 % cream - L brow ?Apply topically 3 (three) times a week. Apply to scaly areas face 3 times weekly Tuesday, Thursday, Saturday ? ?Perioral dermatitis with acne and rosacea ?Head - Anterior (Face) ?mproved today ?Rosacea is a chronic progressive skin condition usually affecting the face of adults, causing redness and/or acne bumps. It is treatable but not curable. It sometimes affects the eyes (ocular rosacea) as well. It may respond to topical and/or systemic medication and can flare with stress, sun exposure, alcohol, exercise and some foods.  Daily application of broad spectrum spf 30+ sunscreen to face is recommended to reduce flares. ? ?Cont Aczone 7.5% gel qd/bid ? ?Dapsone (ACZONE) 7.5 % GEL - Head - Anterior (Face) ?Apply 1 application. topically as directed. Qd to bid to aa face ? ?Poikiloderma of [place] ?neck, chest ?Benign, observe ?Discussed the treatment option of BBL/laser.  Typically we recommend 1-3 treatment sessions about 5-8 weeks apart for best results.  The patient's condition may require "maintenance treatments" in the future.  The fee for BBL / laser treatments is $350 per treatment session for the whole face.  A fee can be quoted for other parts of the body. ?Insurance typically does not pay for BBL/laser treatments and therefore the fee is an out-of-pocket cost. ? ?Recommend spf qd ? ?Sebaceous Hyperplasia ?- Small yellow papules with a central dell ?- Benign ?- Observe  ?- Discussed txt options, observe, ED, PDT with ALA ? ?  Return if symptoms worsen or fail to improve. ? ?I, Ardis Rowan, RMA, am acting as scribe for Armida Sans, MD . ?Documentation: I have reviewed the above documentation for accuracy and completeness, and I agree with the above. ? ?Armida Sans, MD ? ?

## 2021-05-16 NOTE — Patient Instructions (Addendum)
Seborrheic Dermatitis  ?-  is a chronic persistent rash characterized by pinkness and scaling most commonly of the mid face but also can occur on the scalp (dandruff), ears; mid chest, mid back and groin.  It tends to be exacerbated by stress and cooler weather.  People who have neurologic disease may experience new onset or exacerbation of existing seborrheic dermatitis.  The condition is not curable but treatable and can be controlled.  ? ?Start ketoconazole 2% cr 3 times weekly to scaly areas on face, Monday, Wednesday, Friday ?Start Hydrocortisone cream 2.5% cream 3 times weekly to scaly areas on face when flared, Tuesday, Thursday, Saturday. ? ?May continue your Elidel cream or may stop and use the above medications. ? ?If You Need Anything After Your Visit ? ?If you have any questions or concerns for your doctor, please call our main line at [phone] and press option 4 to reach your doctor's medical assistant. If no one answers, please leave a voicemail as directed and we will return your call as soon as possible. Messages left after 4 pm will be answered the following business day.  ? ?You may also send [place] a message via MyChart. We typically respond to MyChart messages within 1-2 business days. ? ?For prescription refills, please ask your pharmacy to contact our office. Our fax number is [phone]. ? ?If you have an urgent issue when the clinic is closed that cannot wait until the next business day, you can page your doctor at the number below.   ? ?Please note that while we do our best to be available for urgent issues outside of office hours, we are not available 24/7.  ? ?If you have an urgent issue and are unable to reach [place], you may choose to seek medical care at your doctor's office, retail clinic, urgent care center, or emergency room. ? ?If you have a medical emergency, please immediately call 911 or go to the emergency department. ? ?Pager Numbers ? ?- Dr. Gwen Pounds: [phone] ? ?- Dr. Neale Burly:  [phone] ? ?- Dr. Roseanne Reno: [phone] ? ?In the event of inclement weather, please call our main line at [phone] for an update on the status of any delays or closures. ? ?Dermatology Medication Tips: ?Please keep the boxes that topical medications come in in order to help keep track of the instructions about where and how to use these. Pharmacies typically print the medication instructions only on the boxes and not directly on the medication tubes.  ? ?If your medication is too expensive, please contact our office at [phone] option 4 or send [place] a message through MyChart.  ? ?We are unable to tell what your co-pay for medications will be in advance as this is different depending on your insurance coverage. However, we may be able to find a substitute medication at lower cost or fill out paperwork to get insurance to cover a needed medication.  ? ?If a prior authorization is required to get your medication covered by your insurance company, please allow [place] 1-2 business days to complete this process. ? ?Drug prices often vary depending on where the prescription is filled and some pharmacies may offer cheaper prices. ? ?The website www.goodrx.com contains coupons for medications through different pharmacies. The prices here do not account for what the cost may be with help from insurance (it may be cheaper with your insurance), but the website can give you the price if you did not use any insurance.  ?- You can print the associated  coupon and take it with your prescription to the pharmacy.  ?- You may also stop by our office during regular business hours and pick up a GoodRx coupon card.  ?- If you need your prescription sent electronically to a different pharmacy, notify our office through [facility] or by phone at [phone] option 4. ? ? ? ? ?Si Usted Necesita Algo Despu?s de Su Visita ? ?Tambi?n puede enviarnos un mensaje a trav?s de MyChart. Por lo general respondemos a los mensajes de  MyChart en el transcurso de 1 a 2 d?as h?biles. ? ?Para renovar recetas, por favor pida a su farmacia que se ponga en contacto con nuestra oficina. Nuestro n?mero de fax es el [phone]. ? ?Si tiene un asunto urgente cuando la cl?nica est? cerrada y que no puede esperar hasta el siguiente d?a h?bil, puede llamar/localizar a su doctor(a) al n?mero que aparece a continuaci?n.  ? ?Por favor, tenga en cuenta que aunque hacemos todo lo posible para estar disponibles para asuntos urgentes fuera del horario de oficina, no estamos disponibles las 24 horas del d?a, los 7 d?as de la semana.  ? ?Si tiene un problema urgente y no puede comunicarse con nosotros, puede optar por buscar atenci?n m?dica  en el consultorio de su doctor(a), en una cl?nica privada, en un centro de atenci?n urgente o en una sala de emergencias. ? ?Si tiene Radio broadcast assistant m?dica, por favor llame inmediatamente al 911 o vaya a la sala de emergencias. ? ?N?meros de b?per ? ?- Dr. Gwen Pounds: [phone] ? ?- Dra. Moye: [phone] ? ?- Dra. Roseanne Reno: [phone] ? ?En caso de inclemencias del tiempo, por favor llame a nuestra l?nea principal al [phone] para una actualizaci?n sobre el estado de cualquier retraso o cierre. ? ?Consejos para la medicaci?n en dermatolog?a: ?Por favor, guarde las cajas en las que vienen los medicamentos de uso t?pico para ayudarle a seguir las instrucciones sobre d?nde y c?mo usarlos. Las farmacias generalmente imprimen las instrucciones del medicamento s?lo en las cajas y no directamente en los tubos del [place].  ? ?Si su medicamento es muy caro, por favor, p?ngase en contacto con Rolm Gala llamando al [phone] y presione la opci?n 4 o env?enos un mensaje a trav?s de MyChart.  ? ?No podemos decirle cu?l ser? su copago por los medicamentos por adelantado ya que esto es diferente dependiendo de la cobertura de su seguro. Sin embargo, es posible que podamos encontrar un medicamento sustituto a Insurance account manager un formulario para que el seguro cubra el medicamento que se considera necesario.  ? ?Si se requiere Neomia Dear autorizaci?n previa para que su compa??a de seguros [place] su medicamento, por favor perm?tanos de 1 a 2 d?as h?biles para completar este proceso. ? ?Los precios de los medicamentos var?an con frecuencia dependiendo del Environmental consultant de d?nde se surte la receta y alguna farmacias pueden ofrecer precios m?s baratos. ? ?El sitio web www.goodrx.com tiene cupones para medicamentos de Health and safety inspector. Los precios aqu? no tienen en cuenta lo que podr?a costar con la ayuda del seguro (puede ser m?s barato con su seguro), pero el sitio web puede darle el precio si no utiliz? ning?n seguro.  ?- Puede imprimir el cup?n correspondiente y llevarlo con su receta a la farmacia.  ?- Tambi?n puede pasar por nuestra oficina durante el horario de atenci?n regular y recoger una tarjeta de cupones de GoodRx.  ?- Si necesita que su receta se env?e electr?nicamente a Psychiatrist, informe a nuestra oficina a trav?s  [place] o por tel?fono llamando al [phone] y presione la opci?n 4.  ?

## 2021-05-17 ENCOUNTER — Encounter: Payer: Self-pay | Admitting: Dermatology

## 2021-05-21 DIAGNOSIS — R404 Transient alteration of awareness: Secondary | ICD-10-CM | POA: Diagnosis not present

## 2021-05-21 DIAGNOSIS — I63442 Cerebral infarction due to embolism of left cerebellar artery: Secondary | ICD-10-CM | POA: Diagnosis not present

## 2021-05-21 DIAGNOSIS — R202 Paresthesia of skin: Secondary | ICD-10-CM | POA: Diagnosis not present

## 2021-05-21 DIAGNOSIS — Z79899 Other long term (current) drug therapy: Secondary | ICD-10-CM | POA: Diagnosis not present
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
## 2021-05-22 ENCOUNTER — Ambulatory Visit: Payer: BC Managed Care – PPO | Admitting: Family Medicine

## 2021-05-22 ENCOUNTER — Encounter: Payer: Self-pay | Admitting: Family Medicine

## 2021-05-22 VITALS — BP 118/72 | HR 91 | Resp 16 | Ht 69.0 in | Wt 193.0 lb

## 2021-05-22 DIAGNOSIS — R000 Tachycardia, unspecified: Secondary | ICD-10-CM

## 2021-05-22 DIAGNOSIS — I69354 Hemiplegia and hemiparesis following cerebral infarction affecting left non-dominant side: Secondary | ICD-10-CM | POA: Diagnosis not present

## 2021-05-22 DIAGNOSIS — M542 Cervicalgia: Secondary | ICD-10-CM

## 2021-05-22 DIAGNOSIS — R569 Unspecified convulsions: Secondary | ICD-10-CM

## 2021-05-22 DIAGNOSIS — G43009 Migraine without aura, not intractable, without status migrainosus: Secondary | ICD-10-CM

## 2021-05-22 DIAGNOSIS — Z79899 Other long term (current) drug therapy: Secondary | ICD-10-CM

## 2021-05-22 DIAGNOSIS — R7303 Prediabetes: Secondary | ICD-10-CM

## 2021-05-22 DIAGNOSIS — I639 Cerebral infarction, unspecified: Secondary | ICD-10-CM

## 2021-05-22 DIAGNOSIS — E785 Hyperlipidemia, unspecified: Secondary | ICD-10-CM

## 2021-05-22 DIAGNOSIS — G4733 Obstructive sleep apnea (adult) (pediatric): Secondary | ICD-10-CM

## 2021-05-22 DIAGNOSIS — F330 Major depressive disorder, recurrent, mild: Secondary | ICD-10-CM | POA: Diagnosis not present

## 2021-05-22 DIAGNOSIS — E559 Vitamin D deficiency, unspecified: Secondary | ICD-10-CM

## 2021-05-22 DIAGNOSIS — F411 Generalized anxiety disorder: Secondary | ICD-10-CM

## 2021-05-22 DIAGNOSIS — G8929 Other chronic pain: Secondary | ICD-10-CM

## 2021-05-22 DIAGNOSIS — U099 Post covid-19 condition, unspecified: Secondary | ICD-10-CM

## 2021-05-22 DIAGNOSIS — F5104 Psychophysiologic insomnia: Secondary | ICD-10-CM

## 2021-05-22 DIAGNOSIS — G9332 Myalgic encephalomyelitis/chronic fatigue syndrome: Secondary | ICD-10-CM

## 2021-05-22 DIAGNOSIS — M310 Hypersensitivity angiitis: Secondary | ICD-10-CM

## 2021-05-22 MED ORDER — ROSUVASTATIN CALCIUM 40 MG PO TABS: 40.0000 mg | ORAL_TABLET | Freq: Every day | ORAL | 1 refills | Status: DC

## 2021-05-22 MED ORDER — ZOLPIDEM TARTRATE ER 12.5 MG PO TBCR: 12.5000 mg | EXTENDED_RELEASE_TABLET | Freq: Every evening | ORAL | 0 refills | Status: DC | PRN

## 2021-05-22 MED ORDER — VASCEPA 1 G PO CAPS: 2.0000 g | ORAL_CAPSULE | Freq: Two times a day (BID) | ORAL | 1 refills | Status: DC

## 2021-05-22 MED ORDER — ALPRAZOLAM ER 1 MG PO TB24: 1.0000 mg | ORAL_TABLET | ORAL | 0 refills | Status: DC

## 2021-05-25 ENCOUNTER — Other Ambulatory Visit: Payer: Self-pay | Admitting: Family Medicine

## 2021-05-25 DIAGNOSIS — E559 Vitamin D deficiency, unspecified: Secondary | ICD-10-CM

## 2021-05-28 ENCOUNTER — Encounter: Payer: Self-pay | Admitting: Cardiology

## 2021-05-28 ENCOUNTER — Ambulatory Visit (INDEPENDENT_AMBULATORY_CARE_PROVIDER_SITE_OTHER): Payer: BC Managed Care – PPO | Admitting: Cardiology

## 2021-05-28 ENCOUNTER — Encounter: Payer: Self-pay | Admitting: *Deleted

## 2021-05-28 VITALS — BP 120/80 | HR 93 | Ht 68.0 in | Wt 190.0 lb

## 2021-05-28 DIAGNOSIS — E782 Mixed hyperlipidemia: Secondary | ICD-10-CM

## 2021-05-28 DIAGNOSIS — R55 Syncope and collapse: Secondary | ICD-10-CM | POA: Diagnosis not present

## 2021-05-28 DIAGNOSIS — R072 Precordial pain: Secondary | ICD-10-CM

## 2021-05-28 DIAGNOSIS — R000 Tachycardia, unspecified: Secondary | ICD-10-CM

## 2021-05-28 DIAGNOSIS — Z006 Encounter for examination for normal comparison and control in clinical research program: Secondary | ICD-10-CM

## 2021-05-28 DIAGNOSIS — R079 Chest pain, unspecified: Secondary | ICD-10-CM

## 2021-05-28 DIAGNOSIS — G901 Familial dysautonomia [Riley-Day]: Secondary | ICD-10-CM

## 2021-05-28 DIAGNOSIS — I639 Cerebral infarction, unspecified: Secondary | ICD-10-CM | POA: Diagnosis not present

## 2021-05-28 NOTE — Patient Instructions (Signed)
Medication Instructions:  ? ?Your physician recommends that you continue on your current medications as directed. Please refer to the Current Medication list given to you today. ? ?*If you need a refill on your cardiac medications before your next appointment, please call your pharmacy* ? ? ?Lab Work: ? ?BMP today.  ?I have placed an order for a Digoxin level to be drawn when you have labs done at primary care.  ? ?If you have labs (blood work) drawn today and your tests are completely normal, you will receive your results only by: ?MyChart Message (if you have MyChart) OR ?A paper copy in the mail ?If you have any lab test that is abnormal or we need to change your treatment, we will call you to review the results. ? ? ?Testing/Procedures: ? ?[facility] MYOVIEW ? ?Your caregiver has ordered a Stress Test with nuclear imaging. The purpose of this test is to evaluate the blood supply to your heart muscle. This procedure is referred to as a "Non-Invasive Stress Test." This is because other than having an IV started in your vein, nothing is inserted or "invades" your body. Cardiac stress tests are done to find areas of poor blood flow to the heart by determining the extent of coronary artery disease (CAD). Some patients exercise on a treadmill, which naturally increases the blood flow to your heart, while others who are  unable to walk on a treadmill due to physical limitations have a pharmacologic/chemical stress agent called Lexiscan . This medicine will mimic walking on a treadmill by temporarily increasing your coronary blood flow.  ? ?Please note: these test may take anywhere between 2-4 hours to complete ? ?PLEASE REPORT TO [facility] MEDICAL MALL ENTRANCE  ?THE VOLUNTEERS AT THE FIRST DESK WILL DIRECT YOU WHERE TO GO ? ?Date of Procedure:_____________________________________ ? ?Arrival Time for Procedure:______________________________ ? ?PLEASE NOTIFY THE OFFICE AT LEAST 52 HOURS IN ADVANCE IF YOU ARE UNABLE TO KEEP YOUR  APPOINTMENT.  [phone] ?AND PLEASE NOTIFY NUCLEAR MEDICINE AT [facility] AT LEAST 24 HOURS IN ADVANCE IF YOU ARE UNABLE TO KEEP YOUR APPOINTMENT. [phone] ? ?How to prepare for your Myoview test: ? ?Do not eat or drink after midnight ?2. No caffeine for 24 hours prior to test ?3. No smoking 24 hours prior to test. ?4. Your medication may be taken with water.  If your doctor stopped a medication because of this test, do not take that medication. ?5. Ladies, please do not wear dresses.  Skirts or pants are appropriate. Please wear a short sleeve shirt. ?6. No perfume, cologne or lotion. ?7. Wear comfortable walking shoes. No heels! ? ? ?Follow-Up: ?At [name], you and your health needs are our priority.  As part of our continuing mission to provide you with exceptional heart care, we have created designated Provider Care Teams.  These Care Teams include your primary Cardiologist (physician) and Advanced Practice Providers (APPs -  Physician Assistants and Nurse Practitioners) who all work together to provide you with the care you need, when you need it. ? ?We recommend signing up for the patient portal called "MyChart".  Sign up information is provided on this After Visit Summary.  MyChart is used to connect with patients for Virtual Visits (Telemedicine).  Patients are able to view lab/test results, encounter notes, upcoming appointments, etc.  Non-urgent messages can be sent to your provider as well.   ?To learn more about what you can do with MyChart, go to [email or web address].   ? ?Your next appointment:   ?  4 - 5 month(s) ? ?The format for your next appointment:   ?In Person ? ?Provider:   ?You may see Kate Sable, MD or one of the following Advanced Practice Providers on your designated Care Team:   ?Murray Hodgkins, NP ?Christell Faith, PA-C ?Cadence Kathlen Mody, PA-C ? ? ? ?Important Information About Sugar ? ? ? ? ? ? ?

## 2021-05-28 NOTE — Progress Notes (Signed)
?Cardiology Office Note:   ? ?Date:  05/28/2021  ? ?ID:  Alicia [name] Note Atia, Haupt [birth year], MRN [id number] ? ?PCP:  Alba Cory, MD  ?[facility] Cardiologist:  Debbe Odea, MD  ?[facility] Electrophysiologist:  None  ? ?Referring MD: Alba Cory, MD  ? ?Chief Complaint  ?Patient presents with  ? Other  ?  2 month follow up -- Patient c.o chest pressure when lying back on her couch. Meds reviewed verbally with patient.   ? ? ? ?History of Present Illness:   ? ?Alicia [name] is a 51 y.o. female with a hx of anxiety, hyperlipidemia, orthostasis, inappropriate sinus tach, CVA, TIA who presents for follow-up.   ? ?Patient is being seen for inappropriate sinus tach and dysautonomia.  Digoxin was started with improvement in heart rates now in the 90s to low 100s.  Midodrine and salt tablets also started.  She states overall her symptoms have much improved, feels less dizzy. ? ?She had 2 syncopal episodes this week.  Describes symptoms of feeling flushed, palpitations, blurry vision prior to passing out.  Symptoms usually occur at work and patient is usually sitting.  She has also noticed chest pressure when she lays on her back.  Planning on obtaining repeat fasting lipid profile next week. ? ? ?Prior notes ?Patient first diagnosed with strokes in 2018.  At that time, she had dizziness, nausea, left-sided weakness.  Also has a history of seizures.  Had another episode of CVA in 2019 with left-sided arm and leg weakness.  Loop recorder was placed in 2018, last checked a month ago without any evidence of A. fib or flutter. ? ?Also states having orthostasis for the past couple of years, and tachycardia.  Was placed on Coreg to help with heart rate control, but this made her orthostasis worse.  Coreg was stopped, her blood pressures improved, but she still gets dizzy when standing for long.  Sometimes, associated with visual issues, and syncope. ? ?Patient was diagnosed with COVID-19 pneumonia  on 01/2020 after presenting to the hospital with shortness of breath.  Chest x-ray showed multifocal pneumonia.  Managed with IV infusion, remdesivir. ? ?Echocardiogram (02/2020 showed normal systolic function, EF 60-65, impaired relaxation. ? ?Past Medical History:  ?Diagnosis Date  ? Anemia   ? Anxiety   ? Depression   ? Dyspnea   ? SINCE STROKE  ? GERD (gastroesophageal reflux disease)   ? Hyperlipidemia   ? Hypertension   ? IBS (irritable bowel syndrome)   ? Migraine   ? Previous cesarean delivery, delivered, with or without mention of antepartum condition 01/19/2012  ? Psychogenic nonepileptic seizure   ? hx/notes 12/19/2016-LAST SEIZURE IN 2019-ON NO MEDS AS OF 05-24-19  ? Restless leg syndrome   ? Sleep apnea   ? USES CPAP  ? Stroke (HCC) 12/18/2016  ? Acute arterial ischemic stroke, multifocal, posterior circulation /notes 12/19/2016-LEFT SIDED WEAKNESS, MEMORY FATIGUE AND TROUBLE FINDING HER WORDS [facility]  ? ? ?Past Surgical History:  ?Procedure Laterality Date  ? ABDOMINOPLASTY  Feb. 2015  ? CESAREAN SECTION  2011  ? CHOLECYSTECTOMY N/A 05/31/2019  ? Procedure: LAPAROSCOPIC CHOLECYSTECTOMY WITH INTRAOPERATIVE CHOLANGIOGRAM;  Surgeon: Earline Mayotte, MD;  Location: [facility] ORS;  Service: General;  Laterality: N/A;  ? COLONOSCOPY WITH PROPOFOL N/A 11/26/2016  ? Procedure: COLONOSCOPY WITH PROPOFOL;  Surgeon: Wyline Mood, MD;  Location: [facility] ENDOSCOPY;  Service: Gastroenterology;  Laterality: N/A;  ? COLONOSCOPY WITH PROPOFOL N/A 08/08/2020  ? Procedure: COLONOSCOPY WITH PROPOFOL;  Surgeon: Regis BillLocklear, Cameron T, MD;  [facility];  Service: Endoscopy;  Laterality: N/A;  ? CYSTOSCOPY  02/02/2015  ? Procedure: CYSTOSCOPY;  Surgeon: Nadara Mustardobert P Harris, MD;  Location: [facility] ORS;  Service: Gynecology;;  ? DILATION AND CURETTAGE OF UTERUS  2003, 2005, 2008  ? ESOPHAGOGASTRODUODENOSCOPY N/A 05/31/2019  ? Procedure: ESOPHAGOGASTRODUODENOSCOPY (EGD);  Surgeon: Earline MayotteByrnett, Jeffrey W, MD;  Location: [facility] ORS;  Service: General;   Laterality: N/A;  in the O.R.  ? ESOPHAGOGASTRODUODENOSCOPY (EGD) WITH PROPOFOL N/A 11/26/2016  ? Procedure: ESOPHAGOGASTRODUODENOSCOPY (EGD) WITH PROPOFOL;  Surgeon: Wyline MoodAnna, Kiran, MD;  [facility];  Service: Gastroenterology;  Laterality: N/A;  ? HERNIA REPAIR  1999  ? Umbilical  ? KNEE ARTHROSCOPY Right 2006  ? LAPAROSCOPIC BILATERAL SALPINGECTOMY Bilateral 02/02/2015  ? Procedure: LAPAROSCOPIC BILATERAL SALPINGECTOMY;  Surgeon: Nadara Mustardobert P Harris, MD;  Location: [facility] ORS;  Service: Gynecology;  Laterality: Bilateral;  ? LAPAROSCOPIC HYSTERECTOMY N/A 02/02/2015  ? Procedure: HYSTERECTOMY TOTAL LAPAROSCOPIC;  Surgeon: Nadara Mustardobert P Harris, MD;  Location: [facility] ORS;  Service: Gynecology;  Laterality: N/A;  ? LOOP RECORDER INSERTION N/A 12/20/2016  ? Procedure: LOOP RECORDER INSERTION;  Surgeon: Regan Lemmingamnitz, Will Martin, MD;  Location: [facility];  Service: Cardiovascular;  Laterality: N/A;  ? REDUCTION MAMMAPLASTY Bilateral December 2015  ? TEE WITHOUT CARDIOVERSION N/A 12/20/2016  ? Procedure: TRANSESOPHAGEAL ECHOCARDIOGRAM (TEE);  Surgeon: Lars MassonNelson, Katarina H, MD;  [facility];  Service: Cardiovascular;  Laterality: N/A;  ? TEE WITHOUT CARDIOVERSION N/A 03/29/2020  ? Procedure: TRANSESOPHAGEAL ECHOCARDIOGRAM (TEE);  Surgeon: Debbe OdeaAgbor-Etang, Rozetta Stumpp, MD;  Location: [facility] ORS;  Service: Cardiovascular;  Laterality: N/A;  ? ? ?Current Medications: ?Current Meds  ?Medication Sig  ? ALPRAZolam (XANAX XR) 1 MG 24 hr tablet Take 1 tablet (1 mg total) by mouth every morning.  ? clopidogrel (PLAVIX) 75 MG tablet Take 75 mg by mouth daily.  ? cyclobenzaprine (FLEXERIL) 10 MG tablet TAKE 1 TABLET(10 MG) BY MOUTH AT BEDTIME  ? Dapsone (ACZONE) 7.5 % GEL Apply 1 application. topically as directed. Qd to bid to aa face  ? digoxin (LANOXIN) 0.125 MG tablet Take 1 tablet (0.125 mg total) by mouth daily.  ? fluticasone furoate-vilanterol (BREO ELLIPTA) 100-25 MCG/INH AEPB Inhale 1 puff into the lungs daily.  ?  hydrocortisone 2.5 % cream Apply topically 3 (three) times a week. Apply to scaly areas face 3 times weekly Tuesday, Thursday, Saturday  ? ketoconazole (NIZORAL) 2 % cream Apply 1 application. topically 3 (three) times a week. Apply to scaly areas face 3 times weekly, Monday, Wednesday, Friday  ? lamoTRIgine (LAMICTAL) 100 MG tablet Take 100 mg by mouth 2 (two) times daily.  ? midodrine (PROAMATINE) 5 MG tablet Take 1 tablet (5 mg) by mouth three times a day at 7 am, 11 am, & 3 pm  ? pantoprazole (PROTONIX) 40 MG tablet Take 1 tablet (40 mg total) by mouth every morning.  ? PARoxetine (PAXIL-CR) 25 MG 24 hr tablet TAKE 2 TABLETS(50 MG) BY MOUTH DAILY  ? pimecrolimus (ELIDEL) 1 % cream Apply topically 2 (two) times daily.  ? rosuvastatin (CRESTOR) 40 MG tablet Take 1 tablet (40 mg total) by mouth daily. In place of Atorvastatin  ? sodium chloride 1 g tablet Take 1 tablet (1 g total) by mouth 3 (three) times daily.  ? UBRELVY 100 MG TABS Take 100 mg by mouth daily as needed (migraine).   ? VASCEPA 1 g capsule Take 2 capsules (2 g total) by mouth 2 (two) times daily.  ?  Vitamin D, Ergocalciferol, (DRISDOL) 1.25 MG (50000 UNIT) CAPS capsule TAKE 1 CAPSULE BY MOUTH EVERY 7 DAYS  ? zolpidem (AMBIEN CR) 12.5 MG CR tablet Take 1 tablet (12.5 mg total) by mouth at bedtime as needed for sleep.  ?  ? ?Allergies:   Azithromycin and Penicillins  ? ?Social History  ? ?Socioeconomic History  ? Marital status: Married  ?  Spouse name: Jonny Ruiz   ? Number of children: 4  ? Years of education: Not on file  ? Highest education level: Not on file  ?Occupational History  ? Occupation: accountant  ?  Employer: [employer]  ?Tobacco Use  ? Smoking status: Never  ? Smokeless tobacco: Never  ?Vaping Use  ? Vaping Use: Never used  ?Substance and Sexual Activity  ? Alcohol use: No  ?  Alcohol/week: 0.0 standard drinks  ? Drug use: No  ? Sexual activity: Yes  ?  Partners: Male  ?Other Topics Concern  ? Not on file  ?Social History Narrative  ?  First husband died suddenly in 2014-07-04  ? Remarried 06/07/2016  ? ?Social Determinants of Health  ? ?Financial Resource Strain: Not on file  ?Food Insecurity: Not on file  ?Transportation Needs: Not on file  ?Phys

## 2021-05-28 NOTE — Research (Signed)
Spoke with Alicia [name] about [employer]. Request more information. Emails consent for her to review.  ?

## 2021-05-29 LAB — BASIC METABOLIC PANEL
BUN/Creatinine Ratio: 14 (ref 9–23)
BUN: 15 mg/dL (ref 6–24)
CO2: 24 mmol/L (ref 20–29)
Calcium: 8.2 mg/dL — ABNORMAL LOW (ref 8.7–10.2)
Chloride: 100 mmol/L (ref 96–106)
Creatinine, Ser: 1.05 mg/dL — ABNORMAL HIGH (ref 0.57–1.00)
Glucose: 118 mg/dL — ABNORMAL HIGH (ref 70–99)
Potassium: 4.4 mmol/L (ref 3.5–5.2)
Sodium: 140 mmol/L (ref 134–144)
eGFR: 65 mL/min/{1.73_m2} (ref >59)

## 2021-05-31 DIAGNOSIS — Z006 Encounter for examination for normal comparison and control in clinical research program: Secondary | ICD-10-CM

## 2021-05-31 NOTE — Research (Signed)
Called patient to follow up on interest in CORE study. Pt states she is interested in participating and would like to come in for Run-in visit, but requests we call her back early next week to schedule. Patient aware we will call her back one day next week to schedule her Run-in visit for Core. ?

## 2021-06-04 ENCOUNTER — Ambulatory Visit
Admission: RE | Admit: 2021-06-04 | Discharge: 2021-06-04 | Disposition: A | Discharge status: Home / Self Care | Payer: BC Managed Care – PPO | Source: Ambulatory Visit | Attending: Cardiology | Admitting: Cardiology

## 2021-06-04 DIAGNOSIS — R079 Chest pain, unspecified: Secondary | ICD-10-CM | POA: Diagnosis not present

## 2021-06-04 LAB — NM MYOCAR MULTI W/SPECT W/WALL MOTION / EF
Base ST Depression (mm): 0 mm
LV dias vol: 40 mL (ref 46–106)
LV sys vol: 8 mL
MPHR: 170 {beats}/min
Nuc Stress EF: 80 %
Peak HR: 118 {beats}/min
Percent HR: 69 %
Rest HR: 82 {beats}/min
Rest Nuclear Isotope Dose: 10.5 mCi
SDS: 6
SRS: 2
SSS: 0
ST Depression (mm): 0 mm
Stress Nuclear Isotope Dose: 32.4 mCi
TID: 1

## 2021-06-04 MED ORDER — TECHNETIUM TC 99M TETROFOSMIN IV KIT
10.0000 | PACK | Freq: Once | INTRAVENOUS | Status: AC
  Administered 2021-06-04: 10.51 via INTRAVENOUS

## 2021-06-04 MED ORDER — REGADENOSON 0.4 MG/5ML IV SOLN
0.4000 mg | Freq: Once | INTRAVENOUS | Status: AC
  Administered 2021-06-04: 0.4 mg via INTRAVENOUS
  Filled 2021-06-04: qty 5

## 2021-06-04 MED ORDER — TECHNETIUM TC 99M TETROFOSMIN IV KIT
30.0000 | PACK | Freq: Once | INTRAVENOUS | Status: AC
  Administered 2021-06-04: 32.4 via INTRAVENOUS

## 2021-07-05 ENCOUNTER — Ambulatory Visit: Payer: BC Managed Care – PPO | Admitting: Internal Medicine

## 2021-07-20 IMAGING — CT CT ANGIO CHEST
2 of 5 series · 19 of 46 positions shown · IV contrast (APPLIED)
Comparison: Single-view of the chest today and 02/07/2020. CT chest
02/07/2020.

CLINICAL DATA: Onset chest pain and shortness of breath this
morning. History of AHP0J-5M pneumonia.

EXAM:
CT ANGIOGRAPHY CHEST WITH CONTRAST
TECHNIQUE: Multidetector CT imaging of the chest was performed using the
standard protocol during bolus administration of intravenous
contrast. Multiplanar CT image reconstructions and MIPs were
obtained to evaluate the vascular anatomy.
CONTRAST:  75 mL OMNIPAQUE IOHEXOL 350 MG/ML SOLN

[Series 5: thins · axial · 0.63mm/px · z∈[-316,-51]mm · 16 of 297 slices shown]
[im 16/297  lung]
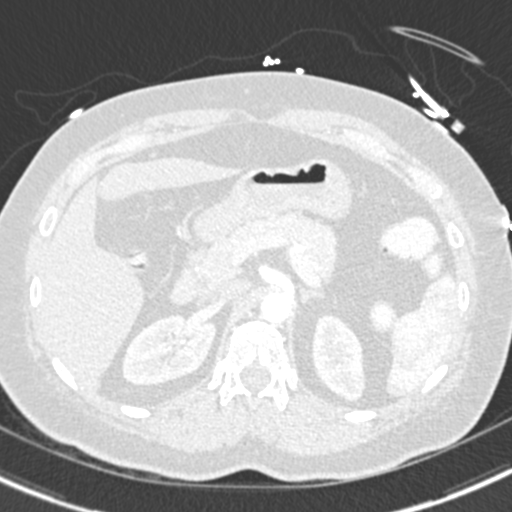
[im 32/297  soft-tissue]
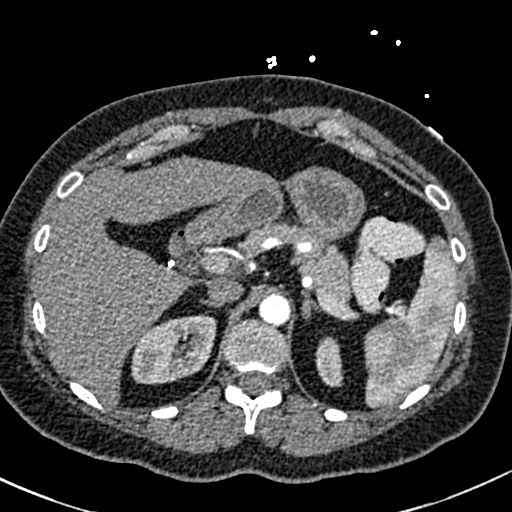
[im 47/297  lung]
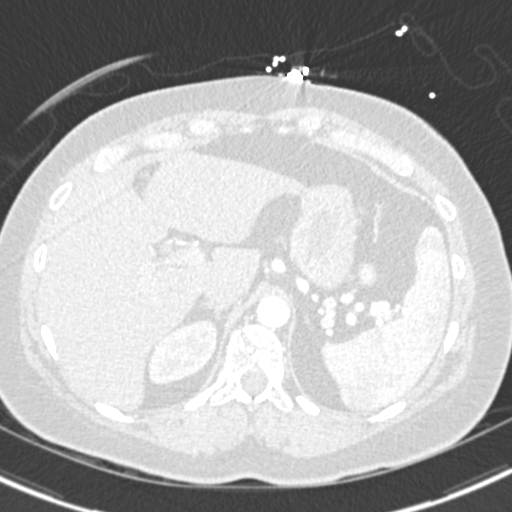
[im 63/297  soft-tissue]
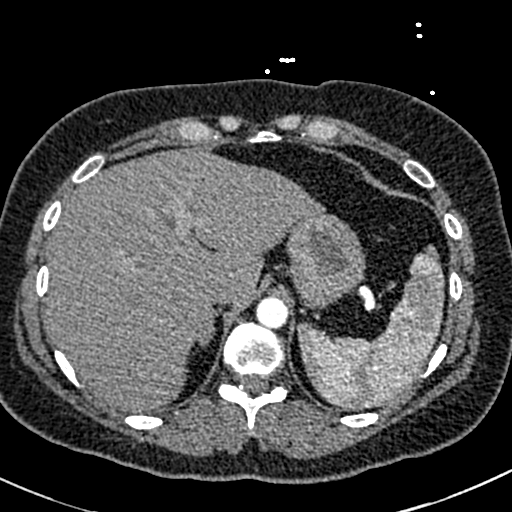
[im 94/297  lung]
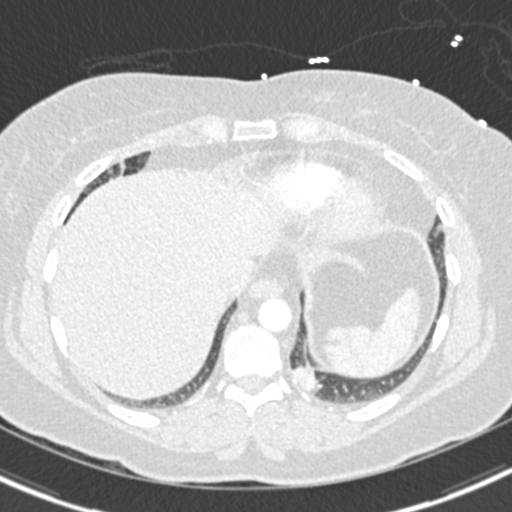
[im 110/297  soft-tissue]
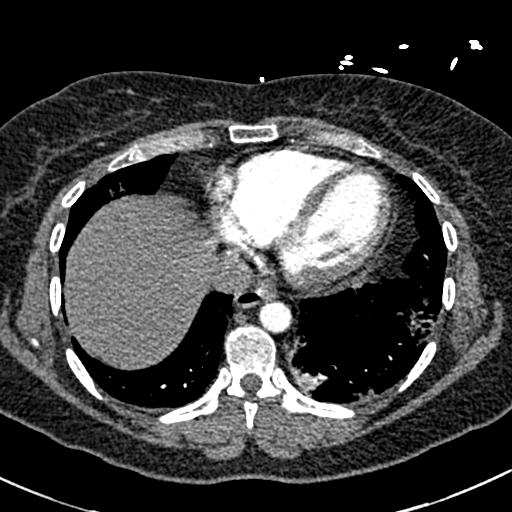
[im 125/297  lung]
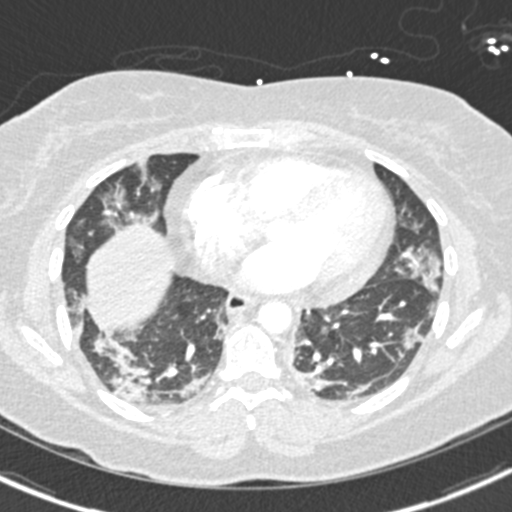
[im 141/297  soft-tissue]
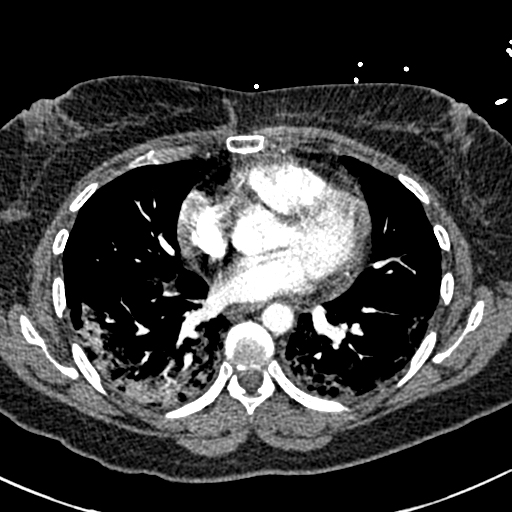
[im 156/297  lung]
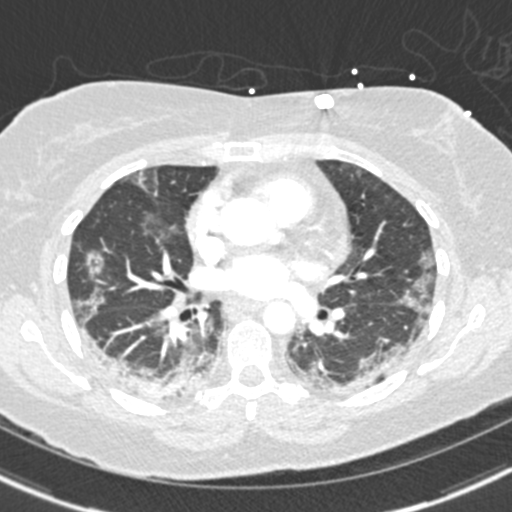
[im 172/297  soft-tissue]
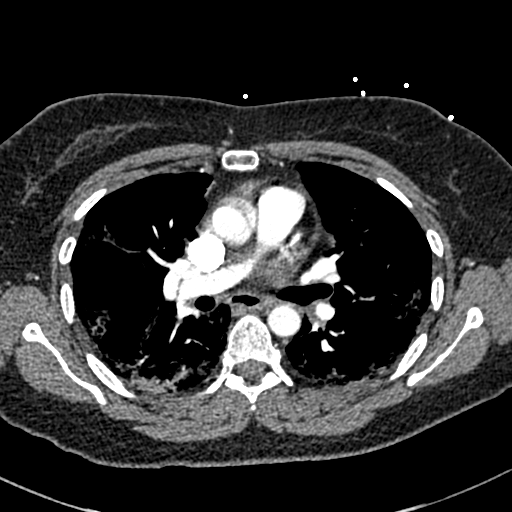
[im 187/297  lung]
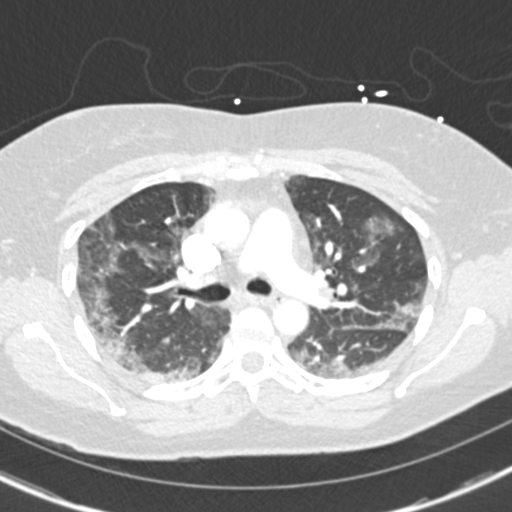
[im 203/297  soft-tissue]
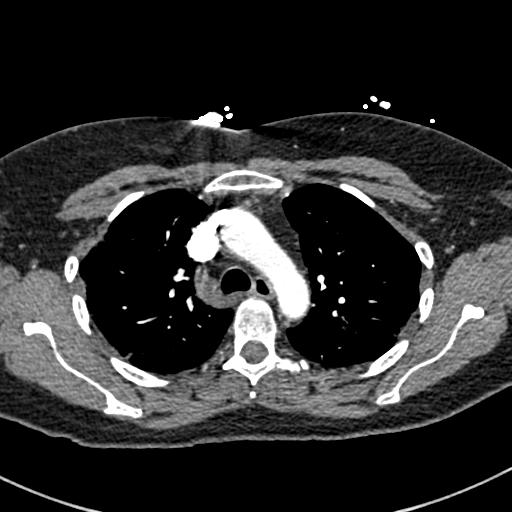
[im 234/297  lung]
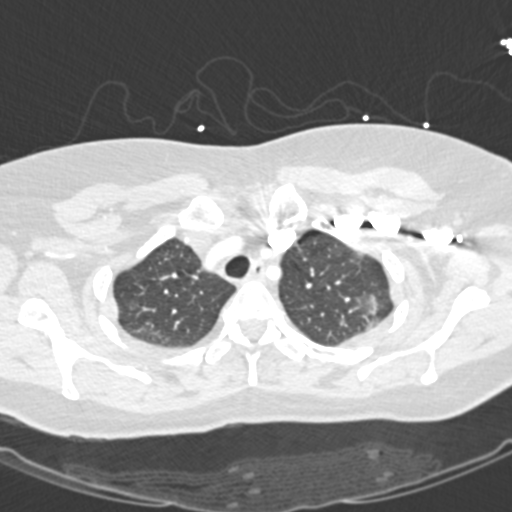
[im 250/297  soft-tissue]
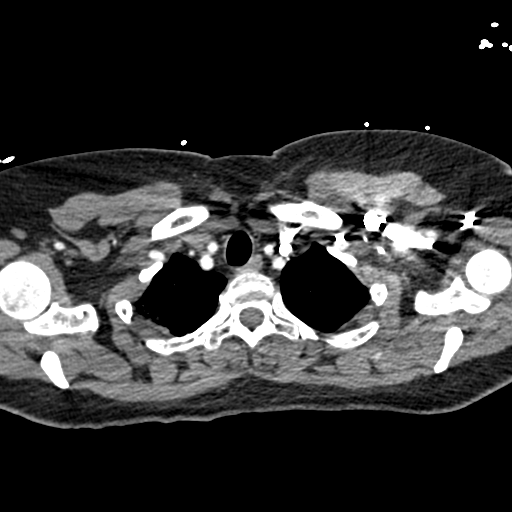
[im 265/297  lung]
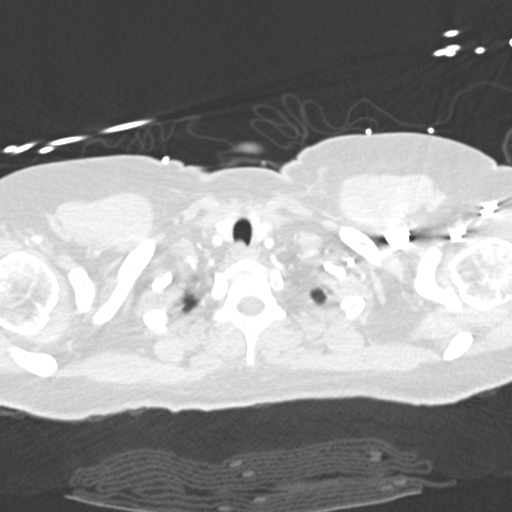
[im 281/297  soft-tissue]
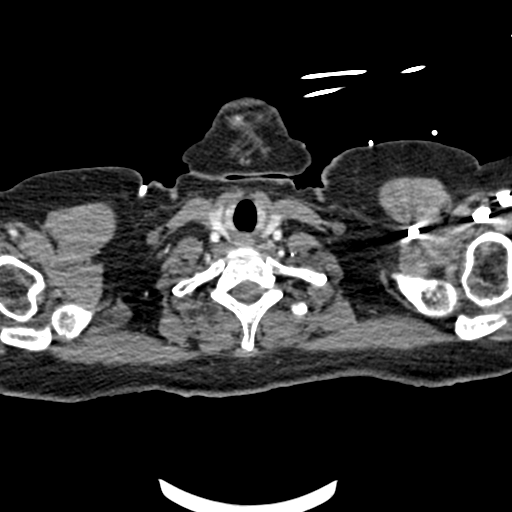

[Series 7: coronal mpr · coronal · 0.58mm/px · 3 of 106 slices shown]
[im 36/106  soft-tissue]
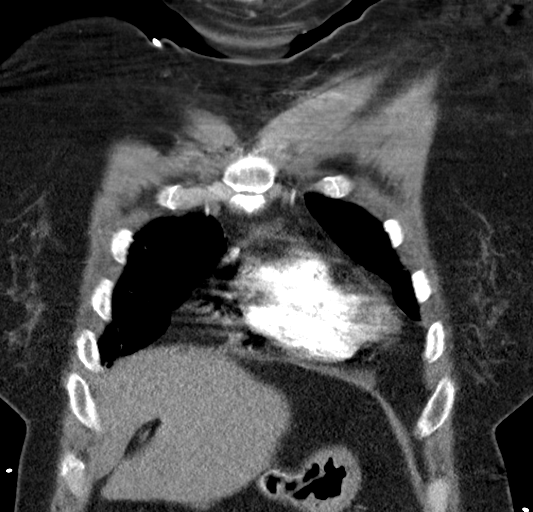
[im 47/106  soft-tissue]
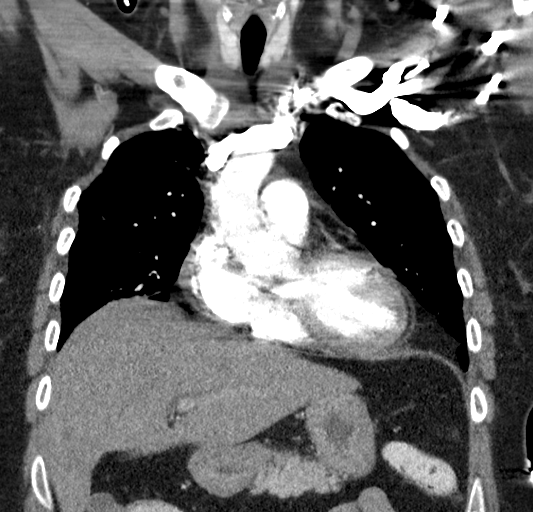
[im 59/106  soft-tissue]
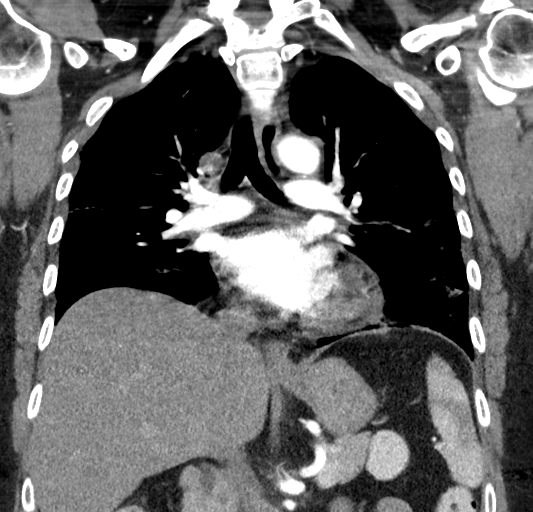

[19 of 46 positions shown; findings below may reference images not displayed]

FINDINGS: Cardiovascular: Satisfactory opacification of the pulmonary arteries
to the segmental level. No evidence of pulmonary embolism. Normal
heart size. No pericardial effusion.

Mediastinum/Nodes: No enlarged mediastinal, hilar, or axillary lymph
nodes. Thyroid gland, trachea, and esophagus demonstrate no
significant findings.

Lungs/Pleura: No pleural effusion. There is ground-glass attenuating
airspace disease in all lobes of both lungs. The density of airspace
disease has improved since the prior CT although it is somewhat more
extensive.

Upper Abdomen: Status post cholecystectomy.

Musculoskeletal: No acute or focal abnormality.

Review of the MIP images confirms the above findings.
IMPRESSION: Negative for pulmonary embolus.

Extensive bilateral airspace disease consistent with AHP0J-5M
infection persists but does show some improvement since prior chest
CT.

## 2021-07-26 ENCOUNTER — Ambulatory Visit: Payer: BC Managed Care – PPO | Admitting: Internal Medicine

## 2021-07-26 ENCOUNTER — Ambulatory Visit (INDEPENDENT_AMBULATORY_CARE_PROVIDER_SITE_OTHER): Payer: BC Managed Care – PPO

## 2021-07-26 ENCOUNTER — Encounter: Payer: Self-pay | Admitting: Internal Medicine

## 2021-07-26 VITALS — Ht 68.0 in | Wt 194.8 lb

## 2021-07-26 DIAGNOSIS — G901 Familial dysautonomia [Riley-Day]: Secondary | ICD-10-CM | POA: Diagnosis not present

## 2021-07-26 DIAGNOSIS — I4711 Inappropriate sinus tachycardia, so stated: Secondary | ICD-10-CM

## 2021-07-26 DIAGNOSIS — R000 Tachycardia, unspecified: Secondary | ICD-10-CM | POA: Diagnosis not present

## 2021-07-26 DIAGNOSIS — I493 Ventricular premature depolarization: Secondary | ICD-10-CM

## 2021-07-26 DIAGNOSIS — I639 Cerebral infarction, unspecified: Secondary | ICD-10-CM | POA: Diagnosis not present

## 2021-07-26 DIAGNOSIS — R0609 Other forms of dyspnea: Secondary | ICD-10-CM

## 2021-07-26 NOTE — Patient Instructions (Addendum)
Medication Instructions:  - Your physician recommends that you continue on your current medications as directed. Please refer to the Current Medication list given to you today.  *If you need a refill on your cardiac medications before your next appointment, please call your pharmacy*   Lab Work: - Your physician recommends that you have lab work today:  CBC/ BMP/ Digoxin  Sales executive at [facility] 1st desk on the right to check in (REGISTRATION)  Lab hours: Monday- Friday (7:30 am- 5:30 pm)    If you have labs (blood work) drawn today and your tests are completely normal, you will receive your results only by: MyChart Message (if you have MyChart) OR A paper copy in the mail If you have any lab test that is abnormal or we need to change your treatment, we will call you to review the results.   Testing/Procedures:  1) Echocardiogram: - Your physician has requested that you have an echocardiogram. Echocardiography is a painless test that uses sound waves to create images of your heart. It provides your doctor with information about the size and shape of your heart and how well your heart's chambers and valves are working. This procedure takes approximately one hour. There are no restrictions for this procedure. There is a possibility that an IV may need to be started during your test to inject an image enhancing agent. This is done to obtain more optimal pictures of your heart. Therefore we ask that you do at least drink some water prior to coming in to hydrate your veins.    2) Heart Monitor:  Length of Wear: 3 days  This will be mailed to your home address within 3-5 business day. If you do not receive the monitor in the mail after 5 business days, then please call our office at [phone] so we can follow up on this for you.  Your physician has recommended that you wear a Zio XT (heart) monitor.   This monitor is a medical device that records the heart's electrical  activity. Doctors most often use these monitors to diagnose arrhythmias. Arrhythmias are problems with the speed or rhythm of the heartbeat. The monitor is a small device applied to your chest. You can wear one while you do your normal daily activities. While wearing this monitor if you have any symptoms to push the button and record what you felt. Once you have worn this monitor for the period of time provider prescribed (Usually 14 days), you will return the monitor device in the postage paid box. Once it is returned they will download the data collected and provide [place] with a report which the provider will then review and we will call you with those results. Important tips:  Avoid showering during the first 24 hours of wearing the monitor. Avoid excessive sweating to help maximize wear time. Do not submerge the device, no hot tubs, and no swimming pools. Keep any lotions or oils away from the patch. After 24 hours you may shower with the patch on. Take brief showers with your back facing the shower head.  Do not remove patch once it has been placed because that will interrupt data and decrease adhesive wear time. Push the button when you have any symptoms and write down what you were feeling. Once you have completed wearing your monitor, remove and place into box which has postage paid and place in your outgoing mailbox.  If for some reason you have misplaced your box then call our  office and we can provide another box and/or mail it off for you.     Follow-Up: At [facility], you and your health needs are our priority.  As part of our continuing mission to provide you with exceptional heart care, we have created designated Provider Care Teams.  These Care Teams include your primary Cardiologist (physician) and Advanced Practice Providers (APPs -  Physician Assistants and Nurse Practitioners) who all work together to provide you with the care you need, when you need it.  We recommend signing  up for the patient portal called "MyChart".  Sign up information is provided on this After Visit Summary.  MyChart is used to connect with patients for Virtual Visits (Telemedicine).  Patients are able to view lab/test results, encounter notes, upcoming appointments, etc.  Non-urgent messages can be sent to your provider as well.   To learn more about what you can do with MyChart, go to [email or web address].    Your next appointment:   4-5 week(s)  The format for your next appointment:   Virtual Visit   Provider:   Sherryl Manges, MD    Other Instructions - It is recommended that you wear an abdominal binder/ thigh sleeves during your waking hours  Important Information About Sugar

## 2021-07-26 NOTE — Progress Notes (Signed)
Patient Care Team: Alba Cory, MD as PCP - General (Family Medicine) Debbe Odea, MD as PCP - Cardiology (Cardiology) Wyline Mood, MD as Consulting Physician (Gastroenterology) Salena Saner, MD as Consulting Physician (Pulmonary Disease) Lonell Face, MD as Consulting Physician (Neurology) Dayna Barker, MD as Referring Physician (Internal Medicine)   HPI  Alicia [name] is a 51 y.o. female Who suffered a cryptogenic stroke 2018 imaging demonstrated small cerebellar and occipital infarcts concerning for embolic origin; she underwent loop recorder implantation (WC) 70; hypercoagulable work-up was negative.  She ended up with a TEE with question of a interatrial septal aneurysm.  Bubble study was negative.  Because of equivocal results, she underwent TCD which was negative Recurrent stroke 1/22  w/u neg  Seen by Dr. Corrin Parker 7/22 with notes complaining of orthostasis and inappropriate tachycardia.  Antecedent history of COVID-pneumonia 12/21 managed with remdesivir; symptoms however a to date COVID.  Long history of shower intolerance, heat intolerance, orthostatic intolerance, exercise intolerance with tachypalpitations  Recurrent syncope 4/23   Spells occur with prolonged sitting and are associated with the need to put her head down and then she falls asleep or drifts out but cannot keep her focus.  When she is working at home and is able to lie down these are not associated with loss of consciousness.  She does not have diaphoresis or flushing with these.  There is mild residual fatigue.  Continues with significant shower intolerance.  Orthostatic lightheadedness is much improved on the ProAmatine.  The associated lightheadedness prior to the use of the ProAmatine is very much like the lightheadedness with these events.  Her diet is replete of fluid and improved with salt.   History of PVCs.  Monitor 1/23 showed less than 1%.  Worsening  dyspnea.     DATE TEST EF   11/18 Echo   55-60 %   2/22 TEE    4/23 Myoview  80% No Ischemia "Attenuation images-no evidence of coronary calcifications   Date Cr K Hgb DIG  9/22 1.01 4.4 15.0   1.9 (12/22)           Records and Results Reviewed  Past Medical History:  Diagnosis Date   Anemia    Anxiety    Depression    Dyspnea    SINCE STROKE   GERD (gastroesophageal reflux disease)    Hyperlipidemia    Hypertension    IBS (irritable bowel syndrome)    Migraine    Previous cesarean delivery, delivered, with or without mention of antepartum condition 01/19/2012   Psychogenic nonepileptic seizure    hx/notes 12/19/2016-LAST SEIZURE IN 2019-ON NO MEDS AS OF 05-24-19   Restless leg syndrome    Sleep apnea    USES CPAP   Stroke (HCC) 12/18/2016   Acute arterial ischemic stroke, multifocal, posterior circulation /notes 12/19/2016-LEFT SIDED WEAKNESS, MEMORY FATIGUE AND TROUBLE FINDING HER WORDS [facility]    Past Surgical History:  Procedure Laterality Date   ABDOMINOPLASTY  Feb. 2015   CESAREAN SECTION  2011   CHOLECYSTECTOMY N/A 05/31/2019   Procedure: LAPAROSCOPIC CHOLECYSTECTOMY WITH INTRAOPERATIVE CHOLANGIOGRAM;  Surgeon: Earline Mayotte, MD;  Location: [facility] ORS;  Service: General;  Laterality: N/A;   COLONOSCOPY WITH PROPOFOL N/A 11/26/2016   Procedure: COLONOSCOPY WITH PROPOFOL;  Surgeon: Wyline Mood, MD;  Location: [facility] ENDOSCOPY;  Service: Gastroenterology;  Laterality: N/A;   COLONOSCOPY WITH PROPOFOL N/A 08/08/2020   Procedure: COLONOSCOPY WITH PROPOFOL;  Surgeon: Regis Bill, MD;  Location:  [place] ENDOSCOPY;  Service: Endoscopy;  Laterality: N/A;   CYSTOSCOPY  02/02/2015   Procedure: CYSTOSCOPY;  Surgeon: Gae Dry, MD;  Location: [facility] ORS;  Service: Gynecology;;   DILATION AND CURETTAGE OF UTERUS  2003, 2005, 2008   ESOPHAGOGASTRODUODENOSCOPY N/A 05/31/2019   Procedure: ESOPHAGOGASTRODUODENOSCOPY (EGD);  Surgeon: Robert Bellow, MD;  Location: [facility]  ORS;  Service: General;  Laterality: N/A;  in the O.R.   ESOPHAGOGASTRODUODENOSCOPY (EGD) WITH PROPOFOL N/A 11/26/2016   Procedure: ESOPHAGOGASTRODUODENOSCOPY (EGD) WITH PROPOFOL;  Surgeon: Jonathon Bellows, MD;  Location[facility] ENDOSCOPY;  Service: Gastroenterology;  Laterality: N/A;   HERNIA REPAIR  [id number]   Umbilical   KNEE ARTHROSCOPY Right 2006   LAPAROSCOPIC BILATERAL SALPINGECTOMY Bilateral 02/02/2015   Procedure: LAPAROSCOPIC BILATERAL SALPINGECTOMY;  Surgeon: Gae Dry, MD;  Location: [facility] ORS;  Service: Gynecology;  Laterality: Bilateral;   LAPAROSCOPIC HYSTERECTOMY N/A 02/02/2015   Procedure: HYSTERECTOMY TOTAL LAPAROSCOPIC;  Surgeon: Gae Dry, MD;  Location: [facility] ORS;  Service: Gynecology;  Laterality: N/A;   LOOP RECORDER INSERTION N/A 12/20/2016   Procedure: LOOP RECORDER INSERTION;  Surgeon: Constance Haw, MD;  Location: [place] CV LAB;  Service: Cardiovascular;  Laterality: N/A;   REDUCTION MAMMAPLASTY Bilateral December 2015   TEE WITHOUT CARDIOVERSION N/A 12/20/2016   Procedure: TRANSESOPHAGEAL ECHOCARDIOGRAM (TEE);  Surgeon: Briani Spark, MD;  Location: [place];  Service: Cardiovascular;  Laterality: N/A;   TEE WITHOUT CARDIOVERSION N/A 03/29/2020   Procedure: TRANSESOPHAGEAL ECHOCARDIOGRAM (TEE);  Surgeon: Kate Sable, MD;  Location: [facility] ORS;  Service: Cardiovascular;  Laterality: N/A;    Current Meds  Medication Sig   ALPRAZolam (XANAX XR) 1 MG 24 hr tablet Take 1 tablet (1 mg total) by mouth every morning.   clopidogrel (PLAVIX) 75 MG tablet Take 75 mg by mouth daily.   cyclobenzaprine (FLEXERIL) 10 MG tablet TAKE 1 TABLET(10 MG) BY MOUTH AT BEDTIME   Dapsone (ACZONE) 7.5 % GEL Apply 1 application. topically as directed. Qd to bid to aa face   digoxin (LANOXIN) 0.125 MG tablet Take 1 tablet (0.125 mg total) by mouth daily.   fluticasone furoate-vilanterol (BREO ELLIPTA) 100-25 MCG/INH AEPB Inhale 1 puff into the lungs daily.    hydrocortisone 2.5 % cream Apply topically 3 (three) times a week. Apply to scaly areas face 3 times weekly Tuesday, Thursday, Saturday   lamoTRIgine (LAMICTAL) 100 MG tablet Take 100 mg by mouth 2 (two) times daily.   midodrine (PROAMATINE) 5 MG tablet Take 1 tablet (5 mg) by mouth three times a day at 7 am, 11 am, & 3 pm   pantoprazole (PROTONIX) 40 MG tablet Take 1 tablet (40 mg total) by mouth every morning.   PARoxetine (PAXIL-CR) 25 MG 24 hr tablet TAKE 2 TABLETS(50 MG) BY MOUTH DAILY   pimecrolimus (ELIDEL) 1 % cream Apply topically 2 (two) times daily.   rosuvastatin (CRESTOR) 40 MG tablet Take 1 tablet (40 mg total) by mouth daily. In place of Atorvastatin   sodium chloride 1 g tablet Take 1 tablet (1 g total) by mouth 3 (three) times daily.   UBRELVY 100 MG TABS Take 100 mg by mouth daily as needed (migraine).    VASCEPA 1 g capsule Take 2 capsules (2 g total) by mouth 2 (two) times daily.   Vitamin D, Ergocalciferol, (DRISDOL) 1.25 MG (50000 UNIT) CAPS capsule TAKE 1 CAPSULE BY MOUTH EVERY 7 DAYS   zolpidem (AMBIEN CR) 12.5 MG CR tablet Take 1 tablet (12.5 mg total) by mouth at  bedtime as needed for sleep.    Allergies  Allergen Reactions   Azithromycin Diarrhea   Penicillins Hives and Swelling    Has patient had a PCN reaction causing immediate rash, facial/tongue/throat swelling, SOB or lightheadedness with hypotension: Yes Has patient had a PCN reaction causing severe rash involving mucus membranes or skin necrosis: No Has patient had a PCN reaction that required hospitalization No Has patient had a PCN reaction occurring within the last 10 years: No If all of the above answers are "NO", then may proceed with Cephalosporin use.      Review of Systems negative except from HPI and PMH  Physical Exam Ht 5\' 8"  (1.727 m)   Wt 194 lb 12.8 oz (88.4 kg)   LMP 01/15/2015   SpO2 96%   BMI 29.62 kg/m  Well developed and nourished in no acute distress HENT normal Neck supple  with JVP-  flat   Clear Irregular rate and rhythm, no murmurs or gallops Abd-soft with active BS No Clubbing cyanosis edema Skin-warm and dry A & Oriented  Grossly normal sensory and motor function  ECG sinus at 97 of intervals 15/07/36     Assessment and  Plan  Cryptogenic stroke  Aortic septal aneurysm without PFO  Dysautonomia-- inappropriate sinus tachycardia/orthostatic intolerance  PVCs  Chest pain   Recurrent strokes on clopidogrel.  Also on rosuvastatin appropriately for stroke risk reduction-- myoview neg  Lightheadedness and orthostatic tachycardia likely reflect hypotension as the mechanism of her events and her blood pressure is running in the 90s.  We will increase her sodium from 1.2 g a day to 2.4 g a day by having her take 2 sodium chloride tablets twice a day.  Her diet is fluid replete.  We discussed the role of compression of thigh and abdomen and reviewed this on the Internet.  We will give her about 4 weeks and if this does not suffice, we will add low-dose fludrocortisone.  Her PVC burden is significantly increased on auscultation electrocardiogram from what was noted previously.  We will requantitate.  It may be contributing to her dyspnea and so we will get a repeat echocardiogram       Virl Axe, MD 07/26/2021 10:01 AM

## 2021-08-03 DIAGNOSIS — I493 Ventricular premature depolarization: Secondary | ICD-10-CM | POA: Diagnosis not present

## 2021-08-07 DIAGNOSIS — R200 Anesthesia of skin: Secondary | ICD-10-CM | POA: Diagnosis not present

## 2021-08-10 ENCOUNTER — Ambulatory Visit (INDEPENDENT_AMBULATORY_CARE_PROVIDER_SITE_OTHER): Payer: BC Managed Care – PPO

## 2021-08-10 DIAGNOSIS — R0600 Dyspnea, unspecified: Secondary | ICD-10-CM

## 2021-08-10 DIAGNOSIS — I493 Ventricular premature depolarization: Secondary | ICD-10-CM

## 2021-08-10 LAB — ECHOCARDIOGRAM COMPLETE
AR max vel: 2.21 cm2
AV Area VTI: 2.1 cm2
AV Area mean vel: 2.02 cm2
AV Mean grad: 4 mmHg
AV Peak grad: 7.4 mmHg
Ao pk vel: 1.36 m/s
Area-P 1/2: 4.68 cm2
Calc EF: 50.5 %
S' Lateral: 3 cm
Single Plane A2C EF: 50.6 %
Single Plane A4C EF: 45.6 %

## 2021-08-15 DIAGNOSIS — I493 Ventricular premature depolarization: Secondary | ICD-10-CM | POA: Diagnosis not present

## 2021-08-23 ENCOUNTER — Encounter: Payer: Self-pay | Admitting: Internal Medicine

## 2021-08-23 ENCOUNTER — Telehealth (INDEPENDENT_AMBULATORY_CARE_PROVIDER_SITE_OTHER): Payer: BC Managed Care – PPO | Admitting: Internal Medicine

## 2021-08-23 VITALS — HR 110 | Ht 68.0 in | Wt 196.0 lb

## 2021-08-23 DIAGNOSIS — R7889 Finding of other specified substances, not normally found in blood: Secondary | ICD-10-CM

## 2021-08-23 DIAGNOSIS — Z79899 Other long term (current) drug therapy: Secondary | ICD-10-CM | POA: Diagnosis not present

## 2021-08-23 NOTE — Progress Notes (Signed)
Pt HR at 110-111 during phone call. Pt states she is sitting.

## 2021-08-23 NOTE — Patient Instructions (Signed)
Medication Instructions:  Your physician recommends that you continue on your current medications as directed. Please refer to the Current Medication list given to you today.  *If you need a refill on your cardiac medications before your next appointment, please call your pharmacy*   Lab Work: Digoxin Level If you have labs (blood work) drawn today and your tests are completely normal, you will receive your results only by: MyChart Message (if you have MyChart) OR A paper copy in the mail If you have any lab test that is abnormal or we need to change your treatment, we will call you to review the results.   Testing/Procedures: None ordered.    Follow-Up: At [facility], you and your health needs are our priority.  As part of our continuing mission to provide you with exceptional heart care, we have created designated Provider Care Teams.  These Care Teams include your primary Cardiologist (physician) and Advanced Practice Providers (APPs -  Physician Assistants and Nurse Practitioners) who all work together to provide you with the care you need, when you need it.  We recommend signing up for the patient portal called "MyChart".  Sign up information is provided on this After Visit Summary.  MyChart is used to connect with patients for Virtual Visits (Telemedicine).  Patients are able to view lab/test results, encounter notes, upcoming appointments, etc.  Non-urgent messages can be sent to your provider as well.   To learn more about what you can do with MyChart, go to [email or web address].    Your next appointment:   4-6 weeks telephone visit  - someone will call you to schedule  Important Information About Sugar

## 2021-08-23 NOTE — Progress Notes (Signed)
Electrophysiology TeleHealth Note   Due to national recommendations of social distancing due to COVID 19, an audio/video telehealth visit is felt to be most appropriate for this patient at this time.  See MyChart message from today for the patient's consent to telehealth for Alicia [name].   Date:  08/23/2021   ID:  Alicia [name], Mccurley 24-Apr-1970, MRN [id number]  Location: patient's home  Provider location: [address] [place]  Evaluation Performed: Follow-up visit  PCP:  Alba Cory, MD  Cardiologist:     Electrophysiologist:  SK   Chief Complaint:     History of Present Illness:    Alicia [name] is a 51 y.o. female who presents via audio/video conferencing for a telehealth visit today.  Since last being seen in our clinic for cryptogenic stroke 2018 with a prior implantable loop recorder and a subsequent recurrent stroke 1/22, interval COVID-pneumonia 12/21 with problems of aggravation of longstanding orthostasis and inappropriate tachycardia and recurrent syncope having been treated with ProAmatine with significant interval improvement the patient reports ongoing tachycardia but less shortness of breath.  Was started on digoxin by Dr. Corrin Parker.  Fluid deplete about 1.5 qu    DATE TEST EF    11/18 Echo   55-60 %    2/22 TEE      4/23 Myoview  80% No Ischemia "Attenuation images-no evidence of coronary calcifications    Date Cr K Hgb DIG  9/22 1.01 4.4 15.0   1.9 (12/22)                The patient denies symptoms of fevers, chills, cough, or new SOB worrisome for COVID 19.    Past Medical History:  Diagnosis Date   Anemia    Anxiety    Depression    Dyspnea    SINCE STROKE   GERD (gastroesophageal reflux disease)    Hyperlipidemia    Hypertension    IBS (irritable bowel syndrome)    Migraine    Previous cesarean delivery, delivered, with or without mention of antepartum condition 01/19/2012   Psychogenic nonepileptic seizure     hx/notes 12/19/2016-LAST SEIZURE IN 2019-ON NO MEDS AS OF 05-24-19   Restless leg syndrome    Sleep apnea    USES CPAP   Stroke (HCC) 12/18/2016   Acute arterial ischemic stroke, multifocal, posterior circulation /notes 12/19/2016-LEFT SIDED WEAKNESS, MEMORY FATIGUE AND TROUBLE FINDING HER WORDS [facility]    Past Surgical History:  Procedure Laterality Date   ABDOMINOPLASTY  Feb. 2015   CESAREAN SECTION  2011   CHOLECYSTECTOMY N/A 05/31/2019   Procedure: LAPAROSCOPIC CHOLECYSTECTOMY WITH INTRAOPERATIVE CHOLANGIOGRAM;  Surgeon: Earline Mayotte, MD;  Location: [facility] ORS;  Service: General;  Laterality: N/A;   COLONOSCOPY WITH PROPOFOL N/A 11/26/2016   Procedure: COLONOSCOPY WITH PROPOFOL;  Surgeon: Wyline Mood, MD;  Location: [facility] ENDOSCOPY;  Service: Gastroenterology;  Laterality: N/A;   COLONOSCOPY WITH PROPOFOL N/A 08/08/2020   Procedure: COLONOSCOPY WITH PROPOFOL;  Surgeon: Regis Bill, MD;  Location: [facility] ENDOSCOPY;  Service: Endoscopy;  Laterality: N/A;   CYSTOSCOPY  02/02/2015   Procedure: CYSTOSCOPY;  Surgeon: Nadara Mustard, MD;  Location: [facility] ORS;  Service: Gynecology;;   DILATION AND CURETTAGE OF UTERUS  2003, 2005, 2008   ESOPHAGOGASTRODUODENOSCOPY N/A 05/31/2019   Procedure: ESOPHAGOGASTRODUODENOSCOPY (EGD);  Surgeon: Earline Mayotte, MD;  Location: [facility] ORS;  Service: General;  Laterality: N/A;  in the O.R.   ESOPHAGOGASTRODUODENOSCOPY (EGD) WITH PROPOFOL N/A 11/26/2016  Procedure: ESOPHAGOGASTRODUODENOSCOPY (EGD) WITH PROPOFOL;  Surgeon: Wyline Mood, MD;  Location: [facility] ENDOSCOPY;  Service: Gastroenterology;  Laterality: N/A;   HERNIA REPAIR  1999   Umbilical   KNEE ARTHROSCOPY Right 2006   LAPAROSCOPIC BILATERAL SALPINGECTOMY Bilateral 02/02/2015   Procedure: LAPAROSCOPIC BILATERAL SALPINGECTOMY;  Surgeon: Nadara Mustard, MD;  Location: [facility] ORS;  Service: Gynecology;  Laterality: Bilateral;   LAPAROSCOPIC HYSTERECTOMY N/A 02/02/2015   Procedure: HYSTERECTOMY TOTAL  LAPAROSCOPIC;  Surgeon: Nadara Mustard, MD;  Location: [facility] ORS;  Service: Gynecology;  Laterality: N/A;   LOOP RECORDER INSERTION N/A 12/20/2016   Procedure: LOOP RECORDER INSERTION;  Surgeon: Regan Lemming, MD;  Location: [facility] INVASIVE CV LAB;  Service: Cardiovascular;  Laterality: N/A;   REDUCTION MAMMAPLASTY Bilateral December 2015   TEE WITHOUT CARDIOVERSION N/A 12/20/2016   Procedure: TRANSESOPHAGEAL ECHOCARDIOGRAM (TEE);  Surgeon: Lars Masson, MD;  Location: [facility] ENDOSCOPY;  Service: Cardiovascular;  Laterality: N/A;   TEE WITHOUT CARDIOVERSION N/A 03/29/2020   Procedure: TRANSESOPHAGEAL ECHOCARDIOGRAM (TEE);  Surgeon: Debbe Odea, MD;  Location: [facility] ORS;  Service: Cardiovascular;  Laterality: N/A;    Current Outpatient Medications  Medication Sig Dispense Refill   ALPRAZolam (XANAX XR) 1 MG 24 hr tablet Take 1 tablet (1 mg total) by mouth every morning. 90 tablet 0   clopidogrel (PLAVIX) 75 MG tablet Take 75 mg by mouth daily.     cyclobenzaprine (FLEXERIL) 10 MG tablet TAKE 1 TABLET(10 MG) BY MOUTH AT BEDTIME 90 tablet 1   Dapsone (ACZONE) 7.5 % GEL Apply 1 application. topically as directed. Qd to bid to aa face 60 g 11   digoxin (LANOXIN) 0.125 MG tablet Take 1 tablet (0.125 mg total) by mouth daily. 30 tablet 5   fluticasone furoate-vilanterol (BREO ELLIPTA) 100-25 MCG/INH AEPB Inhale 1 puff into the lungs daily. 28 each 0   hydrocortisone 2.5 % cream Apply topically 3 (three) times a week. Apply to scaly areas face 3 times weekly Tuesday, Thursday, Saturday 30 g 3   lamoTRIgine (LAMICTAL) 100 MG tablet Take 100 mg by mouth 2 (two) times daily.     midodrine (PROAMATINE) 5 MG tablet Take 1 tablet (5 mg) by mouth three times a day at 7 am, 11 am, & 3 pm 90 tablet 5   pantoprazole (PROTONIX) 40 MG tablet Take 1 tablet (40 mg total) by mouth every morning. 90 tablet 1   PARoxetine (PAXIL-CR) 25 MG 24 hr tablet TAKE 2 TABLETS(50 MG) BY MOUTH DAILY 180 tablet 1    pimecrolimus (ELIDEL) 1 % cream Apply topically 2 (two) times daily. 30 g 0   rosuvastatin (CRESTOR) 40 MG tablet Take 1 tablet (40 mg total) by mouth daily. In place of Atorvastatin 90 tablet 1   sodium chloride 1 g tablet Take 1 tablet (1 g total) by mouth 3 (three) times daily. 90 tablet 5   UBRELVY 100 MG TABS Take 100 mg by mouth daily as needed (migraine).      VASCEPA 1 g capsule Take 2 capsules (2 g total) by mouth 2 (two) times daily. 360 capsule 1   Vitamin D, Ergocalciferol, (DRISDOL) 1.25 MG (50000 UNIT) CAPS capsule TAKE 1 CAPSULE BY MOUTH EVERY 7 DAYS 12 capsule 1   zolpidem (AMBIEN CR) 12.5 MG CR tablet Take 1 tablet (12.5 mg total) by mouth at bedtime as needed for sleep. 90 tablet 0   No current facility-administered medications for this visit.    Allergies:   Azithromycin and Penicillins   ROS:  Please see the history of present illness.   All other systems are personally reviewed and negative.    Exam:    Vital Signs:  Pulse (!) 110   Ht 5\' 8"  (1.727 m)   Wt 196 lb (88.9 kg)   LMP 01/15/2015   SpO2 96%   BMI 29.80 kg/m         Labs/Other Tests and Data Reviewed:    Recent Labs: 10/30/2020: ALT 25; Hemoglobin 15.0; Platelets 396; TSH 3.400 05/28/2021: BUN 15; Creatinine, Ser 1.05; Potassium 4.4; Sodium 140   Wt Readings from Last 3 Encounters:  08/23/21 196 lb (88.9 kg)  07/26/21 194 lb 12.8 oz (88.4 kg)  05/28/21 190 lb (86.2 kg)         ASSESSMENT & PLAN:    Cryptogenic stroke x2 COVID 12/21 Dysautonomia  Orthostatic intolerance and tachypalpitations- Dig level high   She is better.  Her problem with drinking water is that she does not like it; she is able to tolerate many of the liquids.  We have discussed trying to increase her overall fluid intake to about 3 L a day.  In the event that this does not gain sufficient benefit, we we will consider fludrocortisone at her next visit.  Needs a digoxin level.  But I would have a very low threshold for  discontinuing this.    COVID 19 screen The patient denies symptoms of COVID 19 at this time.  The importance of social distancing was discussed today.  Follow-up:  telehealth visit  6 weeks     Current medicines are reviewed at length with the patient today.   The patient  concerns regarding her medicines.  The following changes were made today: Increase fluid intake  Labs/ tests ordered today include: dig level  No orders of the defined types were placed in this encounter.   Future tests ( post COVID )   in   months   Patient Risk:  after full review of this patients clinical status, I feel that they are at moderate risk at this time.  Today, I have spent  minutes with the patient with telehealth technology discussing the above.  Signed, 1/22, MD  08/23/2021 11:53 AM     [name] [address] [place] [phone] (office) [phone] (fax)

## 2021-08-24 ENCOUNTER — Other Ambulatory Visit: Payer: Self-pay | Admitting: Family Medicine

## 2021-08-24 DIAGNOSIS — R000 Tachycardia, unspecified: Secondary | ICD-10-CM | POA: Diagnosis not present

## 2021-08-24 DIAGNOSIS — Z79899 Other long term (current) drug therapy: Secondary | ICD-10-CM | POA: Diagnosis not present

## 2021-08-24 DIAGNOSIS — E229 Hyperfunction of pituitary gland, unspecified: Secondary | ICD-10-CM | POA: Diagnosis not present

## 2021-08-24 DIAGNOSIS — R7982 Elevated C-reactive protein (CRP): Secondary | ICD-10-CM | POA: Diagnosis not present

## 2021-08-24 DIAGNOSIS — R7303 Prediabetes: Secondary | ICD-10-CM | POA: Diagnosis not present

## 2021-08-24 DIAGNOSIS — E785 Hyperlipidemia, unspecified: Secondary | ICD-10-CM | POA: Diagnosis not present

## 2021-08-24 DIAGNOSIS — R3121 Asymptomatic microscopic hematuria: Secondary | ICD-10-CM | POA: Diagnosis not present

## 2021-08-25 LAB — URINALYSIS, COMPLETE
Bilirubin, UA: NEGATIVE
Glucose, UA: NEGATIVE
Ketones, UA: NEGATIVE
Leukocytes,UA: NEGATIVE
Nitrite, UA: NEGATIVE
Protein,UA: NEGATIVE
RBC, UA: NEGATIVE
Specific Gravity, UA: 1.019 (ref 1.005–1.030)
Urobilinogen, Ur: 1 mg/dL (ref 0.2–1.0)
pH, UA: 6.5 (ref 5.0–7.5)

## 2021-08-25 LAB — DIGOXIN LEVEL: Digoxin, Serum: 0.9 ng/mL (ref 0.5–0.9)

## 2021-08-25 LAB — MICROSCOPIC EXAMINATION
Bacteria, UA: NONE SEEN
Casts: NONE SEEN /lpf
WBC, UA: NONE SEEN /hpf (ref 0–5)

## 2021-08-25 LAB — COMPREHENSIVE METABOLIC PANEL
ALT: 17 IU/L (ref 0–44)
AST: 18 IU/L (ref 0–40)
Albumin/Globulin Ratio: 2.1 (ref 1.2–2.2)
Albumin: 4.7 g/dL (ref 4.0–5.0)
Alkaline Phosphatase: 79 IU/L (ref 44–121)
BUN/Creatinine Ratio: 15 (ref 9–20)
BUN: 14 mg/dL (ref 6–24)
Bilirubin Total: 0.6 mg/dL (ref 0.0–1.2)
CO2: 26 mmol/L (ref 20–29)
Calcium: 9.4 mg/dL (ref 8.7–10.2)
Chloride: 96 mmol/L (ref 96–106)
Creatinine, Ser: 0.93 mg/dL (ref 0.76–1.27)
Globulin, Total: 2.2 g/dL (ref 1.5–4.5)
Glucose: 104 mg/dL — ABNORMAL HIGH (ref 70–99)
Potassium: 4.2 mmol/L (ref 3.5–5.2)
Sodium: 137 mmol/L (ref 134–144)
Total Protein: 6.9 g/dL (ref 6.0–8.5)
eGFR: 100 mL/min/{1.73_m2} (ref >59)

## 2021-08-25 LAB — PROLACTIN: Prolactin: 12.6 ng/mL (ref 4.0–15.2)

## 2021-08-25 LAB — LIPID PANEL
Chol/HDL Ratio: 5.8 ratio — ABNORMAL HIGH (ref 0.0–5.0)
Cholesterol, Total: 231 mg/dL — ABNORMAL HIGH (ref 100–199)
HDL: 40 mg/dL (ref >39)
LDL Chol Calc (NIH): 119 mg/dL — ABNORMAL HIGH (ref 0–99)
Triglycerides: 411 mg/dL — ABNORMAL HIGH (ref 0–149)
VLDL Cholesterol Cal: 72 mg/dL — ABNORMAL HIGH (ref 5–40)

## 2021-08-25 LAB — HEMOGLOBIN A1C
Est. average glucose Bld gHb Est-mCnc: 126 mg/dL
Hgb A1c MFr Bld: 6 % — ABNORMAL HIGH (ref 4.8–5.6)

## 2021-08-28 DIAGNOSIS — K582 Mixed irritable bowel syndrome: Secondary | ICD-10-CM | POA: Diagnosis not present

## 2021-08-28 DIAGNOSIS — M6289 Other specified disorders of muscle: Secondary | ICD-10-CM | POA: Diagnosis not present

## 2021-08-29 ENCOUNTER — Emergency Department: Payer: BC Managed Care – PPO

## 2021-08-29 ENCOUNTER — Inpatient Hospital Stay
Admission: EM | Admit: 2021-08-29 | Discharge: 2021-08-31 | DRG: 103 | Disposition: A | Discharge status: Home / Self Care | Payer: BC Managed Care – PPO | Attending: Internal Medicine | Admitting: Internal Medicine

## 2021-08-29 ENCOUNTER — Inpatient Hospital Stay: Payer: BC Managed Care – PPO

## 2021-08-29 ENCOUNTER — Encounter: Payer: Self-pay | Admitting: Radiology

## 2021-08-29 DIAGNOSIS — R109 Unspecified abdominal pain: Secondary | ICD-10-CM | POA: Diagnosis not present

## 2021-08-29 DIAGNOSIS — F32A Depression, unspecified: Secondary | ICD-10-CM | POA: Diagnosis present

## 2021-08-29 DIAGNOSIS — Z7902 Long term (current) use of antithrombotics/antiplatelets: Secondary | ICD-10-CM | POA: Diagnosis not present

## 2021-08-29 DIAGNOSIS — R4701 Aphasia: Secondary | ICD-10-CM | POA: Diagnosis present

## 2021-08-29 DIAGNOSIS — Z880 Allergy status to penicillin: Secondary | ICD-10-CM

## 2021-08-29 DIAGNOSIS — Z8673 Personal history of transient ischemic attack (TIA), and cerebral infarction without residual deficits: Secondary | ICD-10-CM | POA: Diagnosis not present

## 2021-08-29 DIAGNOSIS — G8194 Hemiplegia, unspecified affecting left nondominant side: Secondary | ICD-10-CM | POA: Diagnosis present

## 2021-08-29 DIAGNOSIS — Z8249 Family history of ischemic heart disease and other diseases of the circulatory system: Secondary | ICD-10-CM | POA: Diagnosis not present

## 2021-08-29 DIAGNOSIS — G43009 Migraine without aura, not intractable, without status migrainosus: Secondary | ICD-10-CM | POA: Diagnosis not present

## 2021-08-29 DIAGNOSIS — K219 Gastro-esophageal reflux disease without esophagitis: Secondary | ICD-10-CM | POA: Diagnosis not present

## 2021-08-29 DIAGNOSIS — E785 Hyperlipidemia, unspecified: Secondary | ICD-10-CM | POA: Diagnosis not present

## 2021-08-29 DIAGNOSIS — R29712 NIHSS score 12: Secondary | ICD-10-CM | POA: Diagnosis present

## 2021-08-29 DIAGNOSIS — Z881 Allergy status to other antibiotic agents status: Secondary | ICD-10-CM

## 2021-08-29 DIAGNOSIS — K589 Irritable bowel syndrome without diarrhea: Secondary | ICD-10-CM | POA: Diagnosis present

## 2021-08-29 DIAGNOSIS — G459 Transient cerebral ischemic attack, unspecified: Secondary | ICD-10-CM | POA: Diagnosis not present

## 2021-08-29 DIAGNOSIS — G4733 Obstructive sleep apnea (adult) (pediatric): Secondary | ICD-10-CM | POA: Diagnosis not present

## 2021-08-29 DIAGNOSIS — G2581 Restless legs syndrome: Secondary | ICD-10-CM | POA: Diagnosis present

## 2021-08-29 DIAGNOSIS — I639 Cerebral infarction, unspecified: Secondary | ICD-10-CM | POA: Diagnosis not present

## 2021-08-29 DIAGNOSIS — I10 Essential (primary) hypertension: Secondary | ICD-10-CM | POA: Diagnosis not present

## 2021-08-29 DIAGNOSIS — Z79899 Other long term (current) drug therapy: Secondary | ICD-10-CM | POA: Diagnosis not present

## 2021-08-29 DIAGNOSIS — R000 Tachycardia, unspecified: Secondary | ICD-10-CM | POA: Diagnosis not present

## 2021-08-29 DIAGNOSIS — F419 Anxiety disorder, unspecified: Secondary | ICD-10-CM | POA: Diagnosis not present

## 2021-08-29 DIAGNOSIS — I6389 Other cerebral infarction: Secondary | ICD-10-CM | POA: Diagnosis not present

## 2021-08-29 DIAGNOSIS — R531 Weakness: Secondary | ICD-10-CM | POA: Diagnosis not present

## 2021-08-29 DIAGNOSIS — R519 Headache, unspecified: Secondary | ICD-10-CM | POA: Diagnosis not present

## 2021-08-29 DIAGNOSIS — I6381 Other cerebral infarction due to occlusion or stenosis of small artery: Secondary | ICD-10-CM | POA: Diagnosis not present

## 2021-08-29 LAB — MAGNESIUM: Magnesium: 1.9 mg/dL (ref 1.7–2.4)

## 2021-08-29 LAB — COMPREHENSIVE METABOLIC PANEL
ALT: 18 U/L (ref 0–44)
AST: 22 U/L (ref 15–41)
Albumin: 4.4 g/dL (ref 3.5–5.0)
Alkaline Phosphatase: 62 U/L (ref 38–126)
Anion gap: 13 (ref 5–15)
BUN: 16 mg/dL (ref 6–20)
CO2: 25 mmol/L (ref 22–32)
Calcium: 9.3 mg/dL (ref 8.9–10.3)
Chloride: 101 mmol/L (ref 98–111)
Creatinine, Ser: 0.96 mg/dL (ref 0.44–1.00)
GFR, Estimated: >60 mL/min (ref >60)
Glucose, Bld: 135 mg/dL — ABNORMAL HIGH (ref 70–99)
Potassium: 3.9 mmol/L (ref 3.5–5.1)
Sodium: 139 mmol/L (ref 135–145)
Total Bilirubin: 0.8 mg/dL (ref 0.3–1.2)
Total Protein: 7 g/dL (ref 6.5–8.1)

## 2021-08-29 LAB — DIFFERENTIAL
Abs Immature Granulocytes: 0.03 10*3/uL (ref 0.00–0.07)
Basophils Absolute: 0.1 10*3/uL (ref 0.0–0.1)
Basophils Relative: 1 %
Eosinophils Absolute: 0 10*3/uL (ref 0.0–0.5)
Eosinophils Relative: 0 %
Immature Granulocytes: 0 %
Lymphocytes Relative: 12 %
Lymphs Abs: 1.3 10*3/uL (ref 0.7–4.0)
Monocytes Absolute: 0.4 10*3/uL (ref 0.1–1.0)
Monocytes Relative: 4 %
Neutro Abs: 8.7 10*3/uL — ABNORMAL HIGH (ref 1.7–7.7)
Neutrophils Relative %: 83 %

## 2021-08-29 LAB — CBC
HCT: 39 % (ref 36.0–46.0)
Hemoglobin: 12.9 g/dL (ref 12.0–15.0)
MCH: 29.7 pg (ref 26.0–34.0)
MCHC: 33.1 g/dL (ref 30.0–36.0)
MCV: 89.9 fL (ref 80.0–100.0)
Platelets: 281 10*3/uL (ref 150–400)
RBC: 4.34 MIL/uL (ref 3.87–5.11)
RDW: 13.2 % (ref 11.5–15.5)
WBC: 10.5 10*3/uL (ref 4.0–10.5)
nRBC: 0 % (ref 0.0–0.2)

## 2021-08-29 LAB — GLUCOSE, CAPILLARY: Glucose-Capillary: 127 mg/dL — ABNORMAL HIGH (ref 70–99)

## 2021-08-29 LAB — PROTIME-INR
INR: 1 (ref 0.8–1.2)
Prothrombin Time: 13 seconds (ref 11.4–15.2)

## 2021-08-29 LAB — APTT: aPTT: 26 seconds (ref 24–36)

## 2021-08-29 LAB — HEMOGLOBIN A1C
Hgb A1c MFr Bld: 5.8 % — ABNORMAL HIGH (ref 4.8–5.6)
Mean Plasma Glucose: 119.76 mg/dL

## 2021-08-29 LAB — ETHANOL: Alcohol, Ethyl (B): <10 mg/dL (ref <10)

## 2021-08-29 LAB — CBG MONITORING, ED: Glucose-Capillary: 148 mg/dL — ABNORMAL HIGH (ref 70–99)

## 2021-08-29 LAB — PHOSPHORUS: Phosphorus: 2.4 mg/dL — ABNORMAL LOW (ref 2.5–4.6)

## 2021-08-29 LAB — POC URINE PREG, ED: Preg Test, Ur: NEGATIVE

## 2021-08-29 LAB — MRSA NEXT GEN BY PCR, NASAL: MRSA by PCR Next Gen: NOT DETECTED

## 2021-08-29 MED ORDER — MORPHINE SULFATE (PF) 2 MG/ML IV SOLN
INTRAVENOUS | Status: AC
  Filled 2021-08-29: qty 1

## 2021-08-29 MED ORDER — DOCUSATE SODIUM 100 MG PO CAPS
100.0000 mg | ORAL_CAPSULE | Freq: Two times a day (BID) | ORAL | Status: DC
  Administered 2021-08-30 – 2021-08-31 (×2): 100 mg via ORAL
  Filled 2021-08-29 (×2): qty 1

## 2021-08-29 MED ORDER — TENECTEPLASE FOR STROKE
0.2500 mg/kg | PACK | Freq: Once | INTRAVENOUS | Status: AC
  Administered 2021-08-29: 22 mg via INTRAVENOUS

## 2021-08-29 MED ORDER — MORPHINE SULFATE (PF) 2 MG/ML IV SOLN
1.0000 mg | Freq: Four times a day (QID) | INTRAVENOUS | Status: DC | PRN
  Administered 2021-08-29 – 2021-08-30 (×5): 2 mg via INTRAVENOUS
  Filled 2021-08-29 (×4): qty 1

## 2021-08-29 MED ORDER — SODIUM CHLORIDE 0.9% FLUSH
3.0000 mL | Freq: Once | INTRAVENOUS | Status: AC
  Administered 2021-08-29: 3 mL via INTRAVENOUS

## 2021-08-29 MED ORDER — PANTOPRAZOLE SODIUM 40 MG IV SOLR
40.0000 mg | Freq: Every day | INTRAVENOUS | Status: DC
  Administered 2021-08-29 – 2021-08-30 (×2): 40 mg via INTRAVENOUS
  Filled 2021-08-29 (×2): qty 10

## 2021-08-29 MED ORDER — ACETAMINOPHEN 650 MG RE SUPP
650.0000 mg | Freq: Four times a day (QID) | RECTAL | Status: DC | PRN
Start: 2021-08-29 — End: 2021-08-31
  Administered 2021-08-29: 650 mg via RECTAL
  Filled 2021-08-29: qty 1

## 2021-08-29 MED ORDER — DOCUSATE SODIUM 100 MG PO CAPS: 100.0000 mg | ORAL_CAPSULE | Freq: Two times a day (BID) | ORAL | Status: DC | PRN

## 2021-08-29 MED ORDER — ORAL CARE MOUTH RINSE
15.0000 mL | OROMUCOSAL | Status: DC | PRN
Start: 2021-08-29 — End: 2021-08-31

## 2021-08-29 MED ORDER — IOHEXOL 350 MG/ML SOLN
80.0000 mL | Freq: Once | INTRAVENOUS | Status: AC | PRN
  Administered 2021-08-29: 80 mL via INTRAVENOUS

## 2021-08-29 MED ORDER — SIMETHICONE 80 MG PO CHEW: 80.0000 mg | CHEWABLE_TABLET | Freq: Four times a day (QID) | ORAL | Status: DC | PRN

## 2021-08-29 MED ORDER — ONDANSETRON HCL 4 MG/2ML IJ SOLN: 4.0000 mg | Freq: Four times a day (QID) | INTRAMUSCULAR | Status: DC | PRN

## 2021-08-29 MED ORDER — POLYETHYLENE GLYCOL 3350 17 G PO PACK: 17.0000 g | PACK | Freq: Every day | ORAL | Status: DC | PRN

## 2021-08-29 NOTE — ED Provider Notes (Signed)
[facility] Provider Note    Event Date/Time   First MD Initiated Contact with Patient 08/29/21 1033     (approximate)  History   Chief Complaint: Code Stroke  HPI  Alicia [name] is a 51 y.o. female with a past medical history of anxiety, prior CVA 2018, hypertension, hyperlipidemia who presents to the emergency department for acute difficulty speaking and confusion.  According to the husband in 2018 patient suffered a stroke leading to some mild left-sided deficits and mild memory disturbances but no significant residual symptoms.  Patient left to go to work normal at 7:45 AM this morning.  Shortly afterwards patient became acutely confused difficulty speaking was brought to the emergency department.  EMS activated a code stroke in route to the emergency department and neurology met the patient upon arrival.  Physical Exam    Most recent vital signs: There were no vitals filed for this visit.  General: Patient is awake, tearful, having difficulty understanding/comprehension, difficulty speaking, not following commands well. CV:  Good peripheral perfusion.  Regular rate and rhythm  Resp:  Normal effort.  Equal breath sounds bilaterally.  Abd:  No distention.  Soft, nontender.  No rebound or guarding. Other:  Difficult to perform accurate neurological exam given patient's confusion difficulty following commands.  Neurology is seeing in tandem.   ED Results / Procedures / Treatments   EKG  EKG viewed and interpreted by myself shows a sinus tachycardia at 131 bpm patient has a narrow QRS, normal axis, normal intervals, occasional PVC.  No ST elevation.  RADIOLOGY  I have personally viewed and interpreted the patient's CT images, no acute finding on my evaluation. Radiology is read the CT scan as negative for acute infarct.   MEDICATIONS ORDERED IN ED: Medications  sodium chloride flush (NS) 0.9 % injection 3 mL (has no administration in time  range)  tenecteplase (TNKASE) injection for Stroke 22 mg (has no administration in time range)  iohexol (OMNIPAQUE) 350 MG/ML injection 80 mL (80 mLs Intravenous Contrast Given 08/29/21 1026)     IMPRESSION / MDM / ASSESSMENT AND PLAN / ED COURSE  I reviewed the triage vital signs and the nursing notes.  Patient's presentation is most consistent with acute presentation with potential threat to life or bodily function.  Patient presents emergency department for cute onset of confusion and difficulty speaking.  Has a history of prior CVA in 2018.  Code stroke was called in route to the hospital.  Neurology saw the patient upon arrival and has been following along with the patient.  They have made the decision to order TNK for the patient.  Patient is not able to follow commands well does not appear to comprehend much at this point.  Patient's lab work shows a normal CBC, normal INR.  Remainder the lab work pending.  Lab work shows normal INR, normal CBC, reassuring chemistry.  Negative ethanol.  CTA of the head does not appear to show any large vessel occlusion.  CT scan of the head is negative for acute infarct.  Patient's examination is consistent with likely CVA.  Neurology has dosed TNK.  I spoke to Dr. Lanney Gins will be admitting the patient to the intensive care unit.  CRITICAL CARE Performed by: Harvest Dark   Total critical care time: 30 minutes  Critical care time was exclusive of separately billable procedures and treating other patients.  Critical care was necessary to treat or prevent imminent or life-threatening deterioration.  Critical care  was time spent personally by me on the following activities: development of treatment plan with patient and/or surrogate as well as nursing, discussions with consultants, evaluation of patient's response to treatment, examination of patient, obtaining history from patient or surrogate, ordering and performing treatments and interventions,  ordering and review of laboratory studies, ordering and review of radiographic studies, pulse oximetry and re-evaluation of patient's condition.   FINAL CLINICAL IMPRESSION(S) / ED DIAGNOSES   Acute CVA TNK administration    Note:  This document was prepared using Dragon voice recognition software and may include unintentional dictation errors.   Harvest Dark, MD 08/29/21 1137

## 2021-08-29 NOTE — Consult Note (Addendum)
NEURO HOSPITALIST CONSULT NOTE   Requestig physician: Dr. Lenard Lance  Reason for Consult: Acute onset of aphasia and left sided weakness  History obtained from:  EMS and Chart     HPI:                                                                                                                                          Alicia [name] is a 51 y.o. female with a PMHx of stroke with residual mild speech deficit when fatigued in conjunction with mild left sided incoordination, anemia, depression, anxiety, HLD, HTN, migraines, PNES, RLS and sleep apnea, who presents via EMS as a Code Stroke for acute onset of left sided weakness and aphasia. LKN 0940. 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 When FD personnel tried to speak to her, her speech was garbled/incoherent, but she was able to communicate that she had a headache. When FD tried to help patient out of her car, her left side became weak and she collapsed. On EMS arrival, they noted left sided weakness and aphasia. Initial BP on scene was 200/120, followed by a second reading of 204/122 and a final reading by EMS on arrival to the ED of 180.120. Heart rate was in the 120s consistently per EMS.   Home meds include Plavix, Ubrelvy, Lamictal and rosuvastatin.   Past Medical History:  Diagnosis Date   Anemia    Anxiety    Depression    Dyspnea    SINCE STROKE   GERD (gastroesophageal reflux disease)    Hyperlipidemia    Hypertension    IBS (irritable bowel syndrome)    Migraine    Previous cesarean delivery, delivered, with or without mention of antepartum condition 01/19/2012   Psychogenic nonepileptic seizure    hx/notes 12/19/2016-LAST SEIZURE IN 2019-ON NO MEDS AS OF 05-24-19   Restless leg syndrome    Sleep apnea    USES CPAP   Stroke (HCC) 12/18/2016   Acute arterial ischemic stroke, multifocal, posterior circulation /notes 12/19/2016-LEFT  SIDED WEAKNESS, MEMORY FATIGUE AND TROUBLE FINDING HER WORDS [facility]    Past Surgical History:  Procedure Laterality Date   ABDOMINOPLASTY  Feb. 2015   CESAREAN SECTION  2011   CHOLECYSTECTOMY N/A 05/31/2019   Procedure: LAPAROSCOPIC CHOLECYSTECTOMY WITH INTRAOPERATIVE CHOLANGIOGRAM;  Surgeon: Earline Mayotte, MD;  Location: [facility] ORS;  Service: General;  Laterality: N/A;   COLONOSCOPY WITH PROPOFOL N/A 11/26/2016   Procedure: COLONOSCOPY WITH PROPOFOL;  Surgeon: Wyline Mood, MD;  Location: [facility] ENDOSCOPY;  Service: Gastroenterology;  Laterality: N/A;   COLONOSCOPY WITH PROPOFOL N/A 08/08/2020   Procedure: COLONOSCOPY WITH PROPOFOL;  Surgeon: Regis Bill, MD;  Location: [facility] ENDOSCOPY;  Service: Endoscopy;  Laterality: N/A;   CYSTOSCOPY  02/02/2015   Procedure: CYSTOSCOPY;  Surgeon: Nadara Mustard, MD;  Location: [facility] ORS;  Service: Gynecology;;   DILATION AND CURETTAGE OF UTERUS  2003, 2005, 2008   ESOPHAGOGASTRODUODENOSCOPY N/A 05/31/2019   Procedure: ESOPHAGOGASTRODUODENOSCOPY (EGD);  Surgeon: Earline Mayotte, MD;  Location: [facility] ORS;  Service: General;  Laterality: N/A;  in the O.R.   ESOPHAGOGASTRODUODENOSCOPY (EGD) WITH PROPOFOL N/A 11/26/2016   Procedure: ESOPHAGOGASTRODUODENOSCOPY (EGD) WITH PROPOFOL;  Surgeon: Wyline Mood, MD;  Location: [facility] ENDOSCOPY;  Service: Gastroenterology;  Laterality: N/A;   HERNIA REPAIR  1999   Umbilical   KNEE ARTHROSCOPY Right 2006   LAPAROSCOPIC BILATERAL SALPINGECTOMY Bilateral 02/02/2015   Procedure: LAPAROSCOPIC BILATERAL SALPINGECTOMY;  Surgeon: Nadara Mustard, MD;  Location: [facility] ORS;  Service: Gynecology;  Laterality: Bilateral;   LAPAROSCOPIC HYSTERECTOMY N/A 02/02/2015   Procedure: HYSTERECTOMY TOTAL LAPAROSCOPIC;  Surgeon: Nadara Mustard, MD;  Location: [facility] ORS;  Service: Gynecology;  Laterality: N/A;   LOOP RECORDER INSERTION N/A 12/20/2016   Procedure: LOOP RECORDER INSERTION;  Surgeon: Regan Lemming, MD;  Location: [facility]  INVASIVE CV LAB;  Service: Cardiovascular;  Laterality: N/A;   REDUCTION MAMMAPLASTY Bilateral December 2015   TEE WITHOUT CARDIOVERSION N/A 12/20/2016   Procedure: TRANSESOPHAGEAL ECHOCARDIOGRAM (TEE);  Surgeon: Lars Masson, MD;  Location: [facility] ENDOSCOPY;  Service: Cardiovascular;  Laterality: N/A;   TEE WITHOUT CARDIOVERSION N/A 03/29/2020   Procedure: TRANSESOPHAGEAL ECHOCARDIOGRAM (TEE);  Surgeon: Debbe Odea, MD;  Location: [facility] ORS;  Service: Cardiovascular;  Laterality: N/A;    Family History  Problem Relation Age of Onset   Hypertension Mother    Hyperlipidemia Mother    Heart Problems Father        hole in heart and lower ventricles reversed   Prostate cancer Maternal Grandfather    Von Willebrand disease Maternal Uncle             Social History:  reports that she has never smoked. She has never used smokeless tobacco. She reports that she does not drink alcohol and does not use drugs.  Allergies  Allergen Reactions   Azithromycin Diarrhea   Penicillins Hives and Swelling    Has patient had a PCN reaction causing immediate rash, facial/tongue/throat swelling, SOB or lightheadedness with hypotension: Yes Has patient had a PCN reaction causing severe rash involving mucus membranes or skin necrosis: No Has patient had a PCN reaction that required hospitalization No Has patient had a PCN reaction occurring within the last 10 years: No If all of the above answers are "NO", then may proceed with Cephalosporin use.    HOME MEDICATIONS:                                                                                                                      No current facility-administered medications on file prior to encounter.   Current Outpatient Medications on File Prior to Encounter  Medication Sig Dispense Refill   ALPRAZolam (XANAX XR) 1 MG 24 hr  tablet Take 1 tablet (1 mg total) by mouth every morning. 90 tablet 0   clopidogrel (PLAVIX) 75 MG tablet Take 75 mg by  mouth daily.     cyclobenzaprine (FLEXERIL) 10 MG tablet TAKE 1 TABLET(10 MG) BY MOUTH AT BEDTIME 90 tablet 1   digoxin (LANOXIN) 0.125 MG tablet Take 1 tablet (0.125 mg total) by mouth daily. 30 tablet 5   lamoTRIgine (LAMICTAL) 100 MG tablet Take 100 mg by mouth 2 (two) times daily.     midodrine (PROAMATINE) 5 MG tablet Take 1 tablet (5 mg) by mouth three times a day at 7 am, 11 am, & 3 pm 90 tablet 5   pantoprazole (PROTONIX) 40 MG tablet Take 1 tablet (40 mg total) by mouth every morning. 90 tablet 1   PARoxetine (PAXIL-CR) 25 MG 24 hr tablet TAKE 2 TABLETS(50 MG) BY MOUTH DAILY 180 tablet 1   rosuvastatin (CRESTOR) 40 MG tablet Take 1 tablet (40 mg total) by mouth daily. In place of Atorvastatin 90 tablet 1   sodium chloride 1 g tablet Take 1 tablet (1 g total) by mouth 3 (three) times daily. 90 tablet 5   Vitamin D, Ergocalciferol, (DRISDOL) 1.25 MG (50000 UNIT) CAPS capsule TAKE 1 CAPSULE BY MOUTH EVERY 7 DAYS 12 capsule 1   zolpidem (AMBIEN CR) 12.5 MG CR tablet Take 1 tablet (12.5 mg total) by mouth at bedtime as needed for sleep. 90 tablet 0   Dapsone (ACZONE) 7.5 % GEL Apply 1 application. topically as directed. Qd to bid to aa face 60 g 11   fluticasone furoate-vilanterol (BREO ELLIPTA) 100-25 MCG/INH AEPB Inhale 1 puff into the lungs daily. 28 each 0   hydrocortisone 2.5 % cream Apply topically 3 (three) times a week. Apply to scaly areas face 3 times weekly Tuesday, Thursday, Saturday 30 g 3   pimecrolimus (ELIDEL) 1 % cream Apply topically 2 (two) times daily. 30 g 0   UBRELVY 100 MG TABS Take 100 mg by mouth daily as needed (migraine).      VASCEPA 1 g capsule Take 2 capsules (2 g total) by mouth 2 (two) times daily. (Patient not taking: Reported on 08/29/2021) 360 capsule 1     ROS:                                                                                                                                       Unable to obtain due to aphasia.    Last menstrual period  01/15/2015. BP (!) 129/56   Pulse (!) 57   Ht 5\' 8"  (1.727 m)   Wt 88.3 kg   LMP 01/15/2015   SpO2 98%   BMI 29.60 kg/m    General Examination:  Physical Exam  HEENT-  [place]/AT    Lungs- Respirations unlabored Extremities- No edema  Neurological Examination Mental Status: Dense expressive and receptive aphasia. Agitated and anxious-appearing. Does not follow any commands except for closing eyes after a delay and moves her lips in contorted fashion when asked to smile. Head is persistently rotated 45 degrees to the left despite apparent left sided weakness and ability to track horizontally to the left and right past the midline without asymmetry.  Cranial Nerves: II: Does not reliably blink to threat in either visual field, but will track examiner's face to left and right.  III,IV, VI: Squints eyes intermittently without definite asymmetry. EOMI when tracking. No nystagmus.  V: Reacts intermittently to eyelid stimulation but no reliable reactions to brow ridge noxious pressure. VII: Intermittently with decreased NL fold on right, but will contract both sides of mouth symmetrically when pursing lips and at times also symmetric with intermittent grimacing and mouth movements when muttering nonsensically.  VIII: Did respond to two commands.  IX,X: Unable to visualize palate in the context of agitation.  XI: Head persistently rotated to the left.  XII: Did not protrude tongue to command Motor/Sensory: LUE 0/5 proximally and distally to noxious and commands. Falls to bed immediately after passive elevation and release.  LLE extends at knee with increased tone in response to noxious, then abruptly flexes at knee and hip with 5/5 strength.  RUE 5/5 in response to needle stick RLE 5/5 thrashing and flexing at hip/knee in response to noxious stimuli, almost injuring examiner.  Deep Tendon  Reflexes: 2+ and symmetric brachioradialis and patellae Plantars: Right: downgoing   Left: downgoing Cerebellar/Gait: Unable to assess as not following commands  NIHSS: 12  Lab Results: Basic Metabolic Panel: Recent Labs  Lab 08/24/21 1355  NA 137  K 4.2  CL 96  CO2 26  GLUCOSE 104*  BUN 14  CREATININE 0.93  CALCIUM 9.4    CBC: No results for input(s): "WBC", "NEUTROABS", "HGB", "HCT", "MCV", "PLT" in the last 168 hours.  Cardiac Enzymes: No results for input(s): "CKTOTAL", "CKMB", "CKMBINDEX", "TROPONINI" in the last 168 hours.  Lipid Panel: Recent Labs  Lab 08/24/21 1355  CHOL 231*  TRIG 411*  HDL 40  CHOLHDL 5.8*  LDLCALC 119*    Imaging: CT head: No acute cortically based infarct or acute intracranial hemorrhage identified. ASPECTS 10. Small chronic lacunar infarct in the right thalamus.   Assessment:  51 y.o. female with a PMHx of stroke with residual mild speech deficit when fatigued in conjunction with mild left sided incoordination, anemia, depression, anxiety, HLD, HTN, migraines, PNES, RLS and sleep apnea, who presents via EMS as a Code Stroke for acute onset of left sided weakness and aphasia. LKN 0940. 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 When FD personnel tried to speak to her, her speech was garbled/incoherent, but she was able to communicate that she had a headache. When FD tried to help patient out of her car, her left side became weak and she collapsed. On EMS arrival, they noted left sided weakness and aphasia. Initial BP on scene was 200/120, followed by a second reading of 204/122 and a final reading by EMS on arrival to the ED of 180.120. Heart rate was in the 120s consistently per EMS.  1. CT head: No acute cortically based infarct or acute intracranial hemorrhage identified. ASPECTS 10. Small chronic lacunar infarct in the right  thalamus. 2. CTA of head and neck: Negative for  large vessel occlusion. No significant atherosclerosis or stenosis in the head and neck. 3. Exam reveals several inconsistencies that suggest possible embellishment. The findings do not have a single localization along the neuraxis. However, multifocal acute strokes are possible and benefits of emergent treatment with TNK outweigh the overall risks.  4. Comprehensive metabolic panel unremarkable. U/A negative.  5. Lipid panel is abnormal, which would increase the probability of a stroke.  6. Stroke risk factors: Prior stroke, anemia, HLD, HTN and sleep apnea.  7. After comprehensive review of possible contraindications with her husband, she has no absolute contraindications to TNK administration. 8. Patient is an IV thrombolysis candidate. Discussed extensively the risks/benefits of IV thrombolysis treatment vs. no treatment with the patient's husband, including risks of hemorrhage and death with IV thrombolysis administration versus worse overall outcomes on average in patients within thrombolysis time window who are not administered TNK. The patient's aphasia precludes meaningful medical decision making on her part at this time Overall benefits of TNK regarding long-term prognosis are felt to outweigh risks. The patient's husband expressed understanding and wish to proceed with TNK.   Recommendations: 1. CCM is admitting to the ICU following TNK.  2. Post-TNK order set to include frequent neuro checks and BP management.  3. No antiplatelet medications or anticoagulants for at least 24 hours following tPA.  4. DVT prophylaxis with SCDs.  5. Continue her rosuvastatin.  6. Will need escalation of antiplatelet therapy by addition of ASA to her home Plavix regimen if follow up CT at 24 hours is negative for hemorrhagic conversion. 7. Cardiac telemetry.  8. TTE.  9. MRI brain 10. PT/OT/Speech.  11. NPO until passes swallow evaluation.  12.  Fasting lipid panel, HgbA1c 13. Continue home  Lamictal  60 minutes spent in the emergent neurological evaluation and management of this critically ill patient  Electronically signed: Dr. Caryl Pina 08/29/2021, 10:21 AM

## 2021-08-29 NOTE — ED Notes (Signed)
Pt very anxious and tearful. Pt wanting to stand up to use bathroom. Purewick on pt and pt informed she cannot stand up at this time

## 2021-08-29 NOTE — ED Notes (Signed)
Delay in TNK due to pts BP 180/120 for EMS. No dinamap in CT, waiting on BP for possible labetalol push.

## 2021-08-29 NOTE — Consult Note (Addendum)
1028 cart activated.  Pt in CT per RN with Dr Georg Ruddle present in CT. 1034 Cart taken to CT Dr Georg Ruddle present and at bedside discussing TNK. 1042 Verbal order provided to pharmacy by Dr Georg Ruddle for wt based TNK.  Wt 88.3.   1047 TNK administered as witnessed by bedside RN and pharmacy.   1051 Back to ED with Dr Lenard Lance at bedside.

## 2021-08-29 NOTE — Progress Notes (Signed)
15:00 Patient complaining of generalized headache.  Tylenol PR ordered and administered headache pain decreased.  17:20 Patient called out complaining of 8/10 lower back pain that increased with leg movement.  Morphine 2 mg IV ordered and administered.  Back pain improved after medication.  17:50 patient called out complaining of right upper quadrant pain.  Abdomen soft, bowel sounds present, last BM 08/29/21 and was normal per patient, Provider notified. Order obtained for Protonix push.

## 2021-08-29 NOTE — H&P (Signed)
NAME:  Jensen Cheramie Note Yassin, MRN:  [id number], DOB:  01-13-1971, LOS: 0 ADMISSION DATE:  08/29/2021, CONSULTATION DATE: 08/29/21 REFERRING MD: Dr. Lenard Lance, CHIEF COMPLAINT: Left-sided Weakness    History of Present Illness:  This is a 51 yo female who presented to [facility] ER on 07/12 via EMS with acute onset of difficulty speaking, confusion, and left sided weakness.  Per ER notes while driving to work she had a headache, and pulled over at the fire department.  While trying to talk to fire department personnel her speech was garbled/incoherent.  It was also noted when they tried to assist the pt out of her vehicle she had some left sided weakness and collapsed due to this.  When EMS arrived on the scene pt continued to have left sided weakness and aphasia.  Her initial bp was 200/120 and second reading 204/122.  Her LKW 09:40 am on 07/12.  ED Course In the ER Code stroke initiated bp remained elevated 180/120 and hr 120's.  CT Head revealed no acute cortically based infarct or acute intracranial hemorrhage, ASPECTS 10, and small chronic lacunar infarct in the right thalamus.  Neurology evaluated pt and TNK administered.  PCCM team contacted for ICU admission.   Pertinent  Medical History  Anemia Anxiety Depression Dyspnea GERD HLD HTN IBS Migraine Psychogenic Nonepileptic Seizure  OSA (CPAP qhs) Restless Leg Syndrome Stroke (12/18/2016)  Significant Hospital Events: Including procedures, antibiotic start and stop dates in addition to other pertinent events   07/12: Pt admitted with left-sided weakness and aphasia suspect secondary to acute CVA s/p TNK  07/12: CT Head revealed No acute cortically based infarct or        acute intracranial hemorrhage identified. ASPECTS 10. Small         chronic lacunar infarct in the right thalamus.  07/12: CTA Head/Neck Negative for large vessel occlusion. No         significant atherosclerosis or stenosis in the head and neck.  Interim History  / Subjective:  Pt tearful with aphasia and stating she wants to go home to her son Molli Hazard. C/o 5/10 headache   Objective   Blood pressure (!) 148/85, pulse 78, temperature 98.7 F (37.1 C), temperature source Oral, resp. rate 20, height 5\' 8"  (1.727 m), weight 88.3 kg, last menstrual period 01/15/2015, SpO2 97 %.       No intake or output data in the 24 hours ending 08/29/21 1144 Filed Weights   08/29/21 1053  Weight: 88.3 kg    Examination: General: Acutely ill appearing female, NAD resting in bed  HENT: Supple, no JVD  Lungs: Clear throughout, even, non labored  Cardiovascular: Sinus tachycardia, rrr, no r/g, 2+ radial/2+ distal pulses, no edema  Abdomen: +BS x4, obese, soft, non tender, non distended  Extremities: 3/5 motor strength RUE, 1/5 motor strength LUE/LLE, drift present LUE Neuro: Expressive aphasia, oriented, follows commands, PERRLA GU: Deferred  Resolved Hospital Problem list   N/A  Assessment & Plan:  Expressive aphasia and left sided weakness suspect secondary to acute CVA  Hx: HLD, HTN, and Stroke  - Continuous telemetry monitoring  - Neuro checks per NIH stroke scale  - Allow for permissive hypertension; hold outpatient antihypertensives for now   - No antiplatelet medications or anticoagulants for at least 24 hours following TNK.  - DVT prophylaxis with SCDs - MRI Brain pending  - Repeat CT Head 24 hrs post TNK  - Speech/OT/PT consult pending  - Resume outpatient statin once pt able  to tolerate po's - Lipid panel, hemoglobin A1c, and troponin pending   Best Practice (right click and "Reselect all SmartList Selections" daily)   Diet/type: NPO DVT prophylaxis: SCD GI prophylaxis: N/A Lines: N/A Foley:  N/A Code Status:  full code Last date of multidisciplinary goals of care discussion [N/A]  Labs   CBC: Recent Labs  Lab 08/29/21 1024  WBC 10.5  NEUTROABS 8.7*  HGB 12.9  HCT 39.0  MCV 89.9  PLT 281    Basic Metabolic Panel: Recent  Labs  Lab 08/24/21 1355 08/29/21 1024  NA 137 139  K 4.2 3.9  CL 96 101  CO2 26 25  GLUCOSE 104* 135*  BUN 14 16  CREATININE 0.93 0.96  CALCIUM 9.4 9.3   GFR: Estimated Creatinine Clearance: 81.6 mL/min (by C-G formula based on SCr of 0.96 mg/dL). Recent Labs  Lab 08/29/21 1024  WBC 10.5    Liver Function Tests: Recent Labs  Lab 08/24/21 1355 08/29/21 1024  AST 18 22  ALT 17 18  ALKPHOS 79 62  BILITOT 0.6 0.8  PROT 6.9 7.0  ALBUMIN 4.7 4.4   No results for input(s): "LIPASE", "AMYLASE" in the last 168 hours. No results for input(s): "AMMONIA" in the last 168 hours.  ABG    Component Value Date/Time   PHART 7.41 02/23/2020 1939   PCO2ART 46 02/23/2020 1939   PO2ART 122 (H) 02/23/2020 1939   HCO3 29.2 (H) 02/23/2020 1939   TCO2 28 01/20/2016 0049   ACIDBASEDEF 0.5 02/07/2020 1947   O2SAT 98.8 02/23/2020 1939     Coagulation Profile: Recent Labs  Lab 08/29/21 1024  INR 1.0    Cardiac Enzymes: No results for input(s): "CKTOTAL", "CKMB", "CKMBINDEX", "TROPONINI" in the last 168 hours.  HbA1C: Hemoglobin A1C  Date/Time Value Ref Range Status  08/10/2015 12:00 AM 5.47  Final   Hgb A1c MFr Bld  Date/Time Value Ref Range Status  08/24/2021 01:55 PM 6.0 (H) 4.8 - 5.6 % Final    Comment:             Prediabetes: 5.7 - 6.4          Diabetes: >6.4          Glycemic control for adults with diabetes: <7.0   10/30/2020 08:00 AM 6.4 (H) 4.8 - 5.6 % Final    Comment:             Prediabetes: 5.7 - 6.4          Diabetes: >6.4          Glycemic control for adults with diabetes: <7.0     CBG: Recent Labs  Lab 08/29/21 1008  GLUCAP 148*    Review of Systems: Positives in BOLD   Gen: Denies fever, chills, weight change, fatigue, night sweats HEENT: Denies blurred vision, double vision, hearing loss, tinnitus, sinus congestion, rhinorrhea, sore throat, neck stiffness, dysphagia PULM: Denies shortness of breath, cough, sputum production, hemoptysis,  wheezing CV: Denies chest pain, edema, orthopnea, paroxysmal nocturnal dyspnea, palpitations GI: Denies abdominal pain, nausea, vomiting, diarrhea, hematochezia, melena, constipation, change in bowel habits GU: Denies dysuria, hematuria, polyuria, oliguria, urethral discharge Endocrine: Denies hot or cold intolerance, polyuria, polyphagia or appetite change Derm: Denies rash, dry skin, scaling or peeling skin change Heme: Denies easy bruising, bleeding, bleeding gums Neuro: headache, numbness, aphasia, left-sided weakness, slurred speech, loss of memory or consciousness   Past Medical History:  She,  has a past medical history of Anemia, Anxiety, Depression, Dyspnea, GERD (  gastroesophageal reflux disease), Hyperlipidemia, Hypertension, IBS (irritable bowel syndrome), Migraine, Previous cesarean delivery, delivered, with or without mention of antepartum condition (01/19/2012), Psychogenic nonepileptic seizure, Restless leg syndrome, Sleep apnea, and Stroke (HCC) (12/18/2016).   Surgical History:   Past Surgical History:  Procedure Laterality Date   ABDOMINOPLASTY  Feb. 2015   CESAREAN SECTION  2011   CHOLECYSTECTOMY N/A 05/31/2019   Procedure: LAPAROSCOPIC CHOLECYSTECTOMY WITH INTRAOPERATIVE CHOLANGIOGRAM;  Surgeon: Earline Mayotte, MD;  Location: [facility] ORS;  Service: General;  Laterality: N/A;   COLONOSCOPY WITH PROPOFOL N/A 11/26/2016   Procedure: COLONOSCOPY WITH PROPOFOL;  Surgeon: Wyline Mood, MD;  Location: [facility] ENDOSCOPY;  Service: Gastroenterology;  Laterality: N/A;   COLONOSCOPY WITH PROPOFOL N/A 08/08/2020   Procedure: COLONOSCOPY WITH PROPOFOL;  Surgeon: Regis Bill, MD;  Location: [facility] ENDOSCOPY;  Service: Endoscopy;  Laterality: N/A;   CYSTOSCOPY  02/02/2015   Procedure: CYSTOSCOPY;  Surgeon: Nadara Mustard, MD;  Location: [facility] ORS;  Service: Gynecology;;   DILATION AND CURETTAGE OF UTERUS  2003, 2005, 2008   ESOPHAGOGASTRODUODENOSCOPY N/A 05/31/2019   Procedure:  ESOPHAGOGASTRODUODENOSCOPY (EGD);  Surgeon: Earline Mayotte, MD;  Location: [facility] ORS;  Service: General;  Laterality: N/A;  in the O.R.   ESOPHAGOGASTRODUODENOSCOPY (EGD) WITH PROPOFOL N/A 11/26/2016   Procedure: ESOPHAGOGASTRODUODENOSCOPY (EGD) WITH PROPOFOL;  Surgeon: Wyline Mood, MD;  Location: [facility] ENDOSCOPY;  Service: Gastroenterology;  Laterality: N/A;   HERNIA REPAIR  1999   Umbilical   KNEE ARTHROSCOPY Right 2006   LAPAROSCOPIC BILATERAL SALPINGECTOMY Bilateral 02/02/2015   Procedure: LAPAROSCOPIC BILATERAL SALPINGECTOMY;  Surgeon: Nadara Mustard, MD;  Location: [facility] ORS;  Service: Gynecology;  Laterality: Bilateral;   LAPAROSCOPIC HYSTERECTOMY N/A 02/02/2015   Procedure: HYSTERECTOMY TOTAL LAPAROSCOPIC;  Surgeon: Nadara Mustard, MD;  Location: [facility] ORS;  Service: Gynecology;  Laterality: N/A;   LOOP RECORDER INSERTION N/A 12/20/2016   Procedure: LOOP RECORDER INSERTION;  Surgeon: Regan Lemming, MD;  Location: [facility] INVASIVE CV LAB;  Service: Cardiovascular;  Laterality: N/A;   REDUCTION MAMMAPLASTY Bilateral December 2015   TEE WITHOUT CARDIOVERSION N/A 12/20/2016   Procedure: TRANSESOPHAGEAL ECHOCARDIOGRAM (TEE);  Surgeon: Lars Masson, MD;  Location: [facility] ENDOSCOPY;  Service: Cardiovascular;  Laterality: N/A;   TEE WITHOUT CARDIOVERSION N/A 03/29/2020   Procedure: TRANSESOPHAGEAL ECHOCARDIOGRAM (TEE);  Surgeon: Debbe Odea, MD;  Location: [facility] ORS;  Service: Cardiovascular;  Laterality: N/A;     Social History:   reports that she has never smoked. She has never used smokeless tobacco. She reports that she does not drink alcohol and does not use drugs.   Family History:  Her family history includes Heart Problems in her father; Hyperlipidemia in her mother; Hypertension in her mother; Prostate cancer in her maternal grandfather; Von Willebrand disease in her maternal uncle.   Allergies Allergies  Allergen Reactions   Azithromycin Diarrhea   Penicillins Hives and  Swelling    Has patient had a PCN reaction causing immediate rash, facial/tongue/throat swelling, SOB or lightheadedness with hypotension: Yes Has patient had a PCN reaction causing severe rash involving mucus membranes or skin necrosis: No Has patient had a PCN reaction that required hospitalization No Has patient had a PCN reaction occurring within the last 10 years: No If all of the above answers are "NO", then may proceed with Cephalosporin use.     Home Medications  Prior to Admission medications   Medication Sig Start Date End Date Taking? Authorizing Provider  ALPRAZolam (XANAX XR) 1 MG 24 hr tablet Take 1 tablet (1  mg total) by mouth every morning. 05/22/21  Yes Sowles, Danna Hefty, MD  clopidogrel (PLAVIX) 75 MG tablet Take 75 mg by mouth daily.   Yes [provider]  cyclobenzaprine (FLEXERIL) 10 MG tablet TAKE 1 TABLET(10 MG) BY MOUTH AT BEDTIME 03/15/21  Yes Sowles, Danna Hefty, MD  digoxin (LANOXIN) 0.125 MG tablet Take 1 tablet (0.125 mg total) by mouth daily. 03/30/21  Yes Debbe Odea, MD  lamoTRIgine (LAMICTAL) 100 MG tablet Take 100 mg by mouth 2 (two) times daily. 08/03/19  Yes Lonell Face, MD  midodrine (PROAMATINE) 5 MG tablet Take 1 tablet (5 mg) by mouth three times a day at 7 am, 11 am, & 3 pm 03/30/21  Yes Agbor-Etang, Arlys John, MD  pantoprazole (PROTONIX) 40 MG tablet Take 1 tablet (40 mg total) by mouth every morning. 02/26/21  Yes Sowles, Danna Hefty, MD  PARoxetine (PAXIL-CR) 25 MG 24 hr tablet TAKE 2 TABLETS(50 MG) BY MOUTH DAILY 03/15/21  Yes Sowles, Danna Hefty, MD  rosuvastatin (CRESTOR) 40 MG tablet Take 1 tablet (40 mg total) by mouth daily. In place of Atorvastatin 05/22/21  Yes Sowles, Danna Hefty, MD  sodium chloride 1 g tablet Take 1 tablet (1 g total) by mouth 3 (three) times daily. 03/30/21  Yes Agbor-Etang, Arlys John, MD  Vitamin D, Ergocalciferol, (DRISDOL) 1.25 MG (50000 UNIT) CAPS capsule TAKE 1 CAPSULE BY MOUTH EVERY 7 DAYS 05/25/21  Yes Sowles, Danna Hefty, MD  zolpidem  (AMBIEN CR) 12.5 MG CR tablet Take 1 tablet (12.5 mg total) by mouth at bedtime as needed for sleep. 05/22/21  Yes Sowles, Danna Hefty, MD  Dapsone (ACZONE) 7.5 % GEL Apply 1 application. topically as directed. Qd to bid to aa face 05/16/21   Deirdre Evener, MD  fluticasone furoate-vilanterol (BREO ELLIPTA) 100-25 MCG/INH AEPB Inhale 1 puff into the lungs daily. 06/26/20   Salena Saner, MD  hydrocortisone 2.5 % cream Apply topically 3 (three) times a week. Apply to scaly areas face 3 times weekly Tuesday, Thursday, Saturday 05/16/21   Deirdre Evener, MD  pimecrolimus (ELIDEL) 1 % cream Apply topically 2 (two) times daily. 03/15/21   Alba Cory, MD  UBRELVY 100 MG TABS Take 100 mg by mouth daily as needed (migraine).  10/27/18   Lonell Face, MD  VASCEPA 1 g capsule Take 2 capsules (2 g total) by mouth 2 (two) times daily. Patient not taking: Reported on 08/29/2021 05/22/21   Alba Cory, MD     Critical care time: 45 minutes      Zada Girt, AGNP  Pulmonary/Critical Care Pager [phone] (please enter 7 digits) PCCM Consult Pager [phone] (please enter 7 digits)

## 2021-08-29 NOTE — Progress Notes (Signed)
Patient continues to complain of right upper quadrant pain.  Provider made aware.  STAT KUB ordered.

## 2021-08-29 NOTE — Progress Notes (Signed)
SLP Cancellation Note  Patient Details Name: Alicia [name] Note Thaker MRN: [id number] DOB: [birth year]   Cancelled treatment:       Reason Eval/Treat Not Completed: Medical issues which prohibited therapy;Fatigue/lethargy limiting ability to participate;Patient not medically ready (chart reviewed; consulted NSG re: pt's status then talked w/ pt's Family member in room) Pt is currently drowsy/lethargic per NSG; same observed while in room meeting Family. Pt is not alert/awake for safe participation in BSE at this time. Will f/u in the morning to assess pt's appropriateness for assessment. Recommend frequent oral care care for hygiene and stimulation of swallowing. Aspiration precautions. Pt received TNK post admit in setting of Acute CVA. Neurology following. Of note, pt has a h/o CVA in ~2017-18, per Family.       Jerilynn Som, MS, CCC-SLP Speech Language Pathologist Rehab Services; [facility] [phone] (ascom) Alicia [name] 08/29/2021, 5:24 PM

## 2021-08-29 NOTE — ED Notes (Signed)
Informed RN bed assigned 

## 2021-08-29 NOTE — Progress Notes (Signed)
PHARMACY CONSULT NOTE   Pharmacy Consult for Electrolyte Monitoring and Replacement   Recent Labs: Potassium (mmol/L)  Date Value  08/29/2021 3.9  07/25/2011 4.0   Magnesium (mg/dL)  Date Value  45/04/8880 2.2  02/24/2020 2.2   Calcium (mg/dL)  Date Value  80/04/4915 9.3   Albumin (g/dL)  Date Value  91/50/5697 4.4  08/24/2021 4.7   Phosphorus (mg/dL)  Date Value  94/80/1655 2.9   Sodium (mmol/L)  Date Value  08/29/2021 139  08/24/2021 137  07/25/2011 141   Assessment: 51 yo female w/ PMH of CVA w/ mild speech deficit, anemia, depression, anxiety, HLD, HTN, migraines, PNES, RLS and sleep apnea who presented to [facility] ER on 07/12 via EMS with acute CVA. Pharmacy is asked to follow and replace electrolytes while in CCU  Goal of Therapy:  Electrolytes WNL  Plan:  Electrolyte replacement not warranted today Recheck electrolytes in am  Lowella Bandy ,PharmD Clinical Pharmacist 08/29/2021 1:03 PM

## 2021-08-29 NOTE — Progress Notes (Signed)
  Chaplain On-Call responded to Code Stroke notification at 1006 hours.  The patient was receiving CT Scan when Chaplain arrived.  Chaplain is available for support as requested.  Chaplain Evelena Peat M.Div., [facility]

## 2021-08-29 NOTE — ED Triage Notes (Signed)
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 Pt then fell out of the vehicle and became incoherent. Pt has left side deficits, very agitated and trouble speaking. Hx of CVA in 2018. Husband at bedside reports pt left from home around 740 today.

## 2021-08-30 ENCOUNTER — Inpatient Hospital Stay: Payer: BC Managed Care – PPO

## 2021-08-30 DIAGNOSIS — I639 Cerebral infarction, unspecified: Secondary | ICD-10-CM | POA: Diagnosis not present

## 2021-08-30 DIAGNOSIS — I10 Essential (primary) hypertension: Secondary | ICD-10-CM

## 2021-08-30 DIAGNOSIS — G43009 Migraine without aura, not intractable, without status migrainosus: Secondary | ICD-10-CM

## 2021-08-30 LAB — CBC
HCT: 37.8 % (ref 36.0–46.0)
Hemoglobin: 12.6 g/dL (ref 12.0–15.0)
MCH: 30.3 pg (ref 26.0–34.0)
MCHC: 33.3 g/dL (ref 30.0–36.0)
MCV: 90.9 fL (ref 80.0–100.0)
Platelets: 235 10*3/uL (ref 150–400)
RBC: 4.16 MIL/uL (ref 3.87–5.11)
RDW: 13.4 % (ref 11.5–15.5)
WBC: 6.8 10*3/uL (ref 4.0–10.5)
nRBC: 0 % (ref 0.0–0.2)

## 2021-08-30 LAB — BASIC METABOLIC PANEL
Anion gap: 4 — ABNORMAL LOW (ref 5–15)
BUN: 17 mg/dL (ref 6–20)
CO2: 31 mmol/L (ref 22–32)
Calcium: 9 mg/dL (ref 8.9–10.3)
Chloride: 105 mmol/L (ref 98–111)
Creatinine, Ser: 0.97 mg/dL (ref 0.44–1.00)
GFR, Estimated: >60 mL/min (ref >60)
Glucose, Bld: 117 mg/dL — ABNORMAL HIGH (ref 70–99)
Potassium: 4 mmol/L (ref 3.5–5.1)
Sodium: 140 mmol/L (ref 135–145)

## 2021-08-30 LAB — LIPID PANEL
Cholesterol: 178 mg/dL (ref 0–200)
HDL: 42 mg/dL (ref >40)
LDL Cholesterol: 86 mg/dL (ref 0–99)
Total CHOL/HDL Ratio: 4.2 RATIO
Triglycerides: 251 mg/dL — ABNORMAL HIGH (ref <150)
VLDL: 50 mg/dL — ABNORMAL HIGH (ref 0–40)

## 2021-08-30 LAB — MAGNESIUM: Magnesium: 2.2 mg/dL (ref 1.7–2.4)

## 2021-08-30 LAB — TROPONIN I (HIGH SENSITIVITY): Troponin I (High Sensitivity): 4 ng/L

## 2021-08-30 LAB — PHOSPHORUS: Phosphorus: 3.4 mg/dL (ref 2.5–4.6)

## 2021-08-30 LAB — HIV ANTIBODY (ROUTINE TESTING W REFLEX): HIV Screen 4th Generation wRfx: NONREACTIVE

## 2021-08-30 MED ORDER — LAMOTRIGINE 25 MG PO TABS
100.0000 mg | ORAL_TABLET | Freq: Two times a day (BID) | ORAL | Status: DC
  Administered 2021-08-30 – 2021-08-31 (×2): 100 mg via ORAL
  Filled 2021-08-30 (×2): qty 4

## 2021-08-30 MED ORDER — OXYCODONE-ACETAMINOPHEN 5-325 MG PO TABS
1.0000 | ORAL_TABLET | ORAL | Status: DC | PRN
  Administered 2021-08-30: 1 via ORAL
  Filled 2021-08-30: qty 1

## 2021-08-30 MED ORDER — UBROGEPANT 100 MG PO TABS: 100.0000 mg | ORAL_TABLET | Freq: Every day | ORAL | Status: DC | PRN

## 2021-08-30 MED ORDER — PAROXETINE HCL ER 12.5 MG PO TB24
25.0000 mg | ORAL_TABLET | Freq: Every day | ORAL | Status: DC
  Administered 2021-08-30 – 2021-08-31 (×2): 25 mg via ORAL
  Filled 2021-08-30 (×2): qty 2

## 2021-08-30 MED ORDER — FLUTICASONE FUROATE-VILANTEROL 100-25 MCG/ACT IN AEPB
1.0000 | INHALATION_SPRAY | Freq: Every day | RESPIRATORY_TRACT | Status: DC
  Administered 2021-08-30 – 2021-08-31 (×2): 1 via RESPIRATORY_TRACT
  Filled 2021-08-30: qty 28

## 2021-08-30 MED ORDER — ALPRAZOLAM 0.5 MG PO TABS
1.0000 mg | ORAL_TABLET | Freq: Every day | ORAL | Status: DC
  Administered 2021-08-30: 1 mg via ORAL
  Filled 2021-08-30: qty 2

## 2021-08-30 MED ORDER — ZOLPIDEM TARTRATE 5 MG PO TABS: 5.0000 mg | ORAL_TABLET | Freq: Every evening | ORAL | Status: DC | PRN

## 2021-08-30 MED ORDER — BUTALBITAL-APAP-CAFFEINE 50-325-40 MG PO TABS
1.0000 | ORAL_TABLET | Freq: Four times a day (QID) | ORAL | Status: DC | PRN
Start: 2021-08-30 — End: 2021-08-31
  Administered 2021-08-30 – 2021-08-31 (×2): 1 via ORAL
  Filled 2021-08-30 (×2): qty 1

## 2021-08-30 MED ORDER — ATORVASTATIN CALCIUM 20 MG PO TABS
40.0000 mg | ORAL_TABLET | Freq: Every day | ORAL | Status: DC
  Administered 2021-08-30 – 2021-08-31 (×2): 40 mg via ORAL
  Filled 2021-08-30 (×2): qty 2

## 2021-08-30 MED ORDER — CHLORHEXIDINE GLUCONATE CLOTH 2 % EX PADS
6.0000 | MEDICATED_PAD | Freq: Every day | CUTANEOUS | Status: DC
  Administered 2021-08-30 – 2021-08-31 (×2): 6 via TOPICAL

## 2021-08-30 NOTE — Progress Notes (Signed)
MRI brain: 1. No acute intracranial abnormality. 2. Small remote lacunar infarct involving the right thalamus.  Recent TTE from June: 1. Left ventricular ejection fraction, by estimation, is 60 to 65%. The left ventricle has normal function. The left ventricle has no regional wall motion abnormalities. Left ventricular diastolic parameters are consistent with Grade I diastolic  dysfunction (impaired relaxation).   2. Right ventricular systolic function is normal. The right ventricular size is normal. Tricuspid regurgitation signal is inadequate for assessing PA pressure.   3. The mitral valve is normal in structure. No evidence of mitral valve regurgitation. No evidence of mitral stenosis.   4. The aortic valve is normal in structure. Aortic valve regurgitation is not visualized. No aortic stenosis is present.   5. The inferior vena cava is normal in size with greater than 50% respiratory variability, suggesting right atrial pressure of 3 mmHg.   6. Frequent PVCs noted.   A/R: - Given that she had a recent negative TTE in June, hold off on obtaining a repeat TTE.  - Given recurrent stroke-like presentation, will need TEE  Electronically signed: Dr. Caryl Pina

## 2021-08-30 NOTE — Progress Notes (Signed)
PHARMACY CONSULT NOTE   Pharmacy Consult for Electrolyte Monitoring and Replacement   Recent Labs: Potassium (mmol/L)  Date Value  08/30/2021 4.0  07/25/2011 4.0   Magnesium (mg/dL)  Date Value  79/43/2761 2.2   Calcium (mg/dL)  Date Value  47/10/2955 9.0   Albumin (g/dL)  Date Value  47/34/0370 4.4  08/24/2021 4.7   Phosphorus (mg/dL)  Date Value  96/43/8381 3.4   Sodium (mmol/L)  Date Value  08/30/2021 140  08/24/2021 137  07/25/2011 141   Assessment: 51 yo female w/ PMH of CVA w/ mild speech deficit, anemia, depression, anxiety, HLD, HTN, migraines, PNES, RLS and sleep apnea who presented to [facility] ER on 07/12 via EMS with acute CVA. Pharmacy is asked to follow and replace electrolytes while in CCU  Goal of Therapy:  Electrolytes WNL  Plan:  Electrolyte replacement not warranted today Recheck electrolytes in AM  Martyn Malay ,PharmD Clinical Pharmacist 08/30/2021 7:35 AM

## 2021-08-30 NOTE — Progress Notes (Signed)
   08/30/21 1600  Clinical Encounter Type  Visited With Patient  Visit Type Initial  Referral From Nurse  Consult/Referral To Chaplain   Chaplain responded to nurse. Chaplain visited with patient and husband. Chaplain provided compassionate presence and reflective listening as husband spoke about health challenges. Chaplain is available for follow up if requested.

## 2021-08-30 NOTE — Progress Notes (Signed)
SLP Cancellation Alicia  Patient Details Name: Alicia [name] Alicia [name] MRN: [id number] DOB: 03-Oct-1970   Cancelled treatment:       Reason Eval/Treat Not Completed: Fatigue/lethargy limiting ability to participate   Per RN, pt requesting to rest this morning due to interrupted sleep overnight. SLP to defer efforts at this time and continue efforts as appropriate. RN aware and in agreement.   Alicia [name], M.S., CCC-SLP Speech-Language Pathologist [facility] [phone] Alicia [name])   Alicia [name] 08/30/2021, 8:50 AM

## 2021-08-30 NOTE — Hospital Course (Signed)
Patient is a 51 year old female with history of essential hypertension, dyslipidemia, migraine headache, psychogenic nonepileptic seizure, obstructive sleep apnea on CPAP, restless leg syndrome and a prior stroke who admitted to the hospital on 7/12 with acute onset of difficulty speaking, confusion and left-sided weakness.  Initial CT scan did not show new stroke.  However patient blood pressure was over 204/122.  Patient was given TNK infusion.  Post infusion, MRI did not show acute stroke.

## 2021-08-30 NOTE — Progress Notes (Signed)
  Progress Note   Patient: Alicia [name] Note Sessler VQM:[id number] DOB: 06-04-1970 DOA: 08/29/2021     1 DOS: the patient was seen and examined on 08/30/2021   Brief hospital course: Patient is a 51 year old female with history of essential hypertension, dyslipidemia, migraine headache, psychogenic nonepileptic seizure, obstructive sleep apnea on CPAP, restless leg syndrome and a prior stroke who admitted to the hospital on 7/12 with acute onset of difficulty speaking, confusion and left-sided weakness.  Initial CT scan did not show new stroke.  However patient blood pressure was over 204/122.  Patient was given TNK infusion.  Post infusion, MRI did not show acute stroke.  Assessment and Plan: Acute stroke status post thrombolytic therapy. Essential hypertension. Dyslipidemia. Patient condition had improved, it appears that a thrombolytic therapy was effective, patient did not develop a stroke.  Repeated CT scan did not show any intracranial hemorrhage. Patient is a followed by neurology.  We will continue to follow, continue Lipitor.  Severe frontal headache. Patient developed a severe frontal headache this afternoon, she had a history of migraine headache which she is not a very similar to current headache.  I obtained a stat CT scan without contrast, did not show any acute intracranial hemorrhage. Continue symptomatic treatment.  Also placed on oxygen in case patient has a cluster headache.  Sleep apnea. CPAP while asleep.      Subjective:  Patient is complaining significant frontal headache, no aura.  No visual changes. No nausea vomiting.  Physical Exam: Vitals:   08/30/21 1200 08/30/21 1300 08/30/21 1400 08/30/21 1500  BP: 117/84 107/71 (!) 135/92 134/86  Pulse: (!) 101 95 (!) 104 88  Resp: (!) 22 15 16 16   Temp:      TempSrc:      SpO2: 100% 98% 98% 96%  Weight:      Height:       General exam: Appears calm and comfortable  Respiratory system: Clear to auscultation.  Respiratory effort normal. Cardiovascular system: S1 & S2 heard, RRR. No JVD, murmurs, rubs, gallops or clicks. No pedal edema. Gastrointestinal system: Abdomen is nondistended, soft and nontender. No organomegaly or masses felt. Normal bowel sounds heard. Central nervous system: Alert and oriented. No focal neurological deficits. Extremities: Symmetric 5 x 5 power. Skin: No rashes, lesions or ulcers Psychiatry: Judgement and insight appear normal. Mood & affect appropriate.   Data Reviewed:  CT scan, MRI and lab results reviewed.  Family Communication: Husband updated at bedside  Disposition: Status is: Inpatient Remains inpatient appropriate because: Severity of disease.  Planned Discharge Destination: Home    Time spent: 35 minutes  Author: , MD 08/30/2021 3:33 PM  For on call review [email or web address].

## 2021-08-30 NOTE — Evaluation (Signed)
Clinical/Bedside Swallow Evaluation Patient Details  Name: Zoya Sprecher Note Leggio MRN: [id number] Date of Birth: 1970/08/08  Today's Date: 08/30/2021 Time: SLP Start Time (ACUTE ONLY): 1100 SLP Stop Time (ACUTE ONLY): 1125 SLP Time Calculation (min) (ACUTE ONLY): 25 min  Past Medical History:  Past Medical History:  Diagnosis Date   Anemia    Anxiety    Depression    Dyspnea    SINCE STROKE   GERD (gastroesophageal reflux disease)    Hyperlipidemia    Hypertension    IBS (irritable bowel syndrome)    Migraine    Previous cesarean delivery, delivered, with or without mention of antepartum condition 01/19/2012   Psychogenic nonepileptic seizure    hx/notes 12/19/2016-LAST SEIZURE IN 2019-ON NO MEDS AS OF 05-24-19   Restless leg syndrome    Sleep apnea    USES CPAP   Stroke (HCC) 12/18/2016   Acute arterial ischemic stroke, multifocal, posterior circulation /notes 12/19/2016-LEFT SIDED WEAKNESS, MEMORY FATIGUE AND TROUBLE FINDING HER WORDS [facility]   Past Surgical History:  Past Surgical History:  Procedure Laterality Date   ABDOMINOPLASTY  Feb. 2015   CESAREAN SECTION  2011   CHOLECYSTECTOMY N/A 05/31/2019   Procedure: LAPAROSCOPIC CHOLECYSTECTOMY WITH INTRAOPERATIVE CHOLANGIOGRAM;  Surgeon: Earline Mayotte, MD;  Location: [facility] ORS;  Service: General;  Laterality: N/A;   COLONOSCOPY WITH PROPOFOL N/A 11/26/2016   Procedure: COLONOSCOPY WITH PROPOFOL;  Surgeon: Wyline Mood, MD;  Location: [facility] ENDOSCOPY;  Service: Gastroenterology;  Laterality: N/A;   COLONOSCOPY WITH PROPOFOL N/A 08/08/2020   Procedure: COLONOSCOPY WITH PROPOFOL;  Surgeon: Regis Bill, MD;  Location: [facility] ENDOSCOPY;  Service: Endoscopy;  Laterality: N/A;   CYSTOSCOPY  02/02/2015   Procedure: CYSTOSCOPY;  Surgeon: Nadara Mustard, MD;  Location: [facility] ORS;  Service: Gynecology;;   DILATION AND CURETTAGE OF UTERUS  2003, 2005, 2008   ESOPHAGOGASTRODUODENOSCOPY N/A 05/31/2019   Procedure:  ESOPHAGOGASTRODUODENOSCOPY (EGD);  Surgeon: Earline Mayotte, MD;  Location: [facility] ORS;  Service: General;  Laterality: N/A;  in the O.R.   ESOPHAGOGASTRODUODENOSCOPY (EGD) WITH PROPOFOL N/A 11/26/2016   Procedure: ESOPHAGOGASTRODUODENOSCOPY (EGD) WITH PROPOFOL;  Surgeon: Wyline Mood, MD;  Location: [facility] ENDOSCOPY;  Service: Gastroenterology;  Laterality: N/A;   HERNIA REPAIR  1999   Umbilical   KNEE ARTHROSCOPY Right 2006   LAPAROSCOPIC BILATERAL SALPINGECTOMY Bilateral 02/02/2015   Procedure: LAPAROSCOPIC BILATERAL SALPINGECTOMY;  Surgeon: Nadara Mustard, MD;  Location: [facility] ORS;  Service: Gynecology;  Laterality: Bilateral;   LAPAROSCOPIC HYSTERECTOMY N/A 02/02/2015   Procedure: HYSTERECTOMY TOTAL LAPAROSCOPIC;  Surgeon: Nadara Mustard, MD;  Location: [facility] ORS;  Service: Gynecology;  Laterality: N/A;   LOOP RECORDER INSERTION N/A 12/20/2016   Procedure: LOOP RECORDER INSERTION;  Surgeon: Regan Lemming, MD;  Location: [facility] INVASIVE CV LAB;  Service: Cardiovascular;  Laterality: N/A;   REDUCTION MAMMAPLASTY Bilateral December 2015   TEE WITHOUT CARDIOVERSION N/A 12/20/2016   Procedure: TRANSESOPHAGEAL ECHOCARDIOGRAM (TEE);  Surgeon: Lars Masson, MD;  Location: [facility] ENDOSCOPY;  Service: Cardiovascular;  Laterality: N/A;   TEE WITHOUT CARDIOVERSION N/A 03/29/2020   Procedure: TRANSESOPHAGEAL ECHOCARDIOGRAM (TEE);  Surgeon: Debbe Odea, MD;  Location: [facility] ORS;  Service: Cardiovascular;  Laterality: N/A;   HPI:  Per H&P "This is a 51 yo female who presented to [facility] ER on 07/12 via EMS with acute onset of difficulty speaking, confusion, and left sided weakness.  Per ER notes while driving to work she had a headache, and pulled over at the fire department.  While trying to talk  to fire department personnel her speech was garbled/incoherent.  It was also noted when they tried to assist the pt out of her vehicle she had some left sided weakness and collapsed due to this.  When EMS arrived  on the scene pt continued to have left sided weakness and aphasia.  Her initial bp was 200/120 and second reading 204/122.  Her LKW 09:40 am on 07/12." MRI Brain 08/30/21 "1. No acute intracranial abnormality.  2. Small remote lacunar infarct involving the right thalamus."    Assessment / Plan / Recommendation  Clinical Impression  Pt seen for clinical swallowing evaluation. Pt alert, kept eyes closed for majority of evaluation. Endorsed headache and photosensitivity. Husband at bedside. Cleared with RN.  Oral motor examination completed and was unremarkable with exception of pt's report of "tingling" in L face and tongue.   Pt given trials of thin liquids (~8 juice via single and consecutive straw sips) and regular solids (saltine; presented in halves). Pt demonstrated a functional oral swallow. Pharyngeal swallow appeared [facility] per clinical assessment. No overt or subtle s/sx pharyngeal dysphagia noted across trials. No change to vovcla quality or vitals appreciated.   Recommend initiation of a regular diet with thin liquids with safe swallowing strategies/aspiration precautions as outlined below.   Of note, pt with x1 instance of confused/tangential speech which did not appear consistent with previous conversational exchanges. RN re-assessed pt and stated it was OK to continue to work with pt. Pt also slow to respond at times. Pt had stroke in 2018, ?baseline cognitive-linguistic deficits vs new deficits. Per SLP assessment in 2018, pt scored WFL on all areas of Cognistat "however required moderate-max verbal encouragement to participate, eyes remaining closed throughout evaluation, and patient with delayed responses. Noted to be occassionally perseverative outside of evaluation." Per pt and husband, pt working full time in Audiological scientist at [employer]. Pt independent with ADLs, IADLs (including driving) PTA. Pt noted difficulty increased difficulty wordfinding especially when fatigued as a result of previous  stroke. Further assessment warranted.   SLP to f/u per POC for cognitive-linguistic evaluation. Cognitive-linguistic evaluation deferred at this time given pt's c/o headache and photosensitivity.   Pt, pt's husband, and RN made aware of results, recommendations, and SLP POC. Pt and husband verbalized understanding/agreement.   SLP Visit Diagnosis: Dysphagia, unspecified (R13.10)    Aspiration Risk  Mild aspiration risk    Diet Recommendation Regular;Thin liquid   Medication Administration:  (as tolerated) Supervision:  (assistance with feeding PRN) Compensations: Slow rate;Small sips/bites (reflux precautions) Postural Changes: Seated upright at 90 degrees;Remain upright for at least 30 minutes after po intake    Other  Recommendations Oral Care Recommendations: Oral care BID (assistance PRN)    Recommendations for follow up therapy are one component of a multi-disciplinary discharge planning process, led by the attending physician.  Recommendations may be updated based on patient status, additional functional criteria and insurance authorization.  Follow up Recommendations  (TBD)      Assistance Recommended at Discharge  (TBD)  Functional Status Assessment Patient has not had a recent decline in their functional status (for swallowing; recommend cognitive-linguistic evaluation)         Prognosis Prognosis for Safe Diet Advancement: Good      Swallow Study   General Date of Onset: 08/29/21 Type of Study: Bedside Swallow Evaluation Previous Swallow Assessment: unknown Diet Prior to this Study: NPO Temperature Spikes Noted: No Respiratory Status: Room air History of Recent Intubation: No Behavior/Cognition: Alert;Confused Oral Cavity Assessment: Within Functional  Limits Oral Care Completed by SLP: Recent completion by staff Oral Cavity - Dentition: Adequate natural dentition Vision: Functional for self-feeding Self-Feeding Abilities:  (pt did not attempt to feed  self) Patient Positioning: Upright in bed Baseline Vocal Quality: Normal Volitional Cough: Strong Volitional Swallow: Able to elicit    Oral/Motor/Sensory Function Overall Oral Motor/Sensory Function: Within functional limits (no functional deficits appreciated; pt reports "tingling" in L face and tongue)   Ice Chips Ice chips: Not tested   Thin Liquid Thin Liquid: Within functional limits Presentation: Straw Other Comments: ~8 oz of juice via single and sequential straw sips    Nectar Thick Nectar Thick Liquid: Not tested   Honey Thick Honey Thick Liquid: Not tested   Puree Puree: Not tested   Solid     Solid: Within functional limits Other Comments: saltine cracker; presented in halves     Clyde Canterbury, M.S., CCC-SLP Speech-Language Pathologist [facility] [phone] Arnette Felts)  Alessandra Bevels Annajulia Lewing 08/30/2021,12:39 PM

## 2021-08-30 NOTE — Progress Notes (Signed)
Pt scoring 4 for this RN on NIH. Refused qh NIH, allowed RN to perform q2h NIH scoring. Pt and family feel that rest is the most important intervention right now. Per pt she sees "carousel animal shapes/outlines". She feels like she is swinging back and forth like a "fair ride". Per her husband she gets like this sometimes and this is not new on this admission.

## 2021-08-31 DIAGNOSIS — I10 Essential (primary) hypertension: Secondary | ICD-10-CM | POA: Diagnosis not present

## 2021-08-31 DIAGNOSIS — G43009 Migraine without aura, not intractable, without status migrainosus: Secondary | ICD-10-CM | POA: Diagnosis not present

## 2021-08-31 DIAGNOSIS — I639 Cerebral infarction, unspecified: Secondary | ICD-10-CM | POA: Diagnosis not present

## 2021-08-31 LAB — LUPUS ANTICOAGULANT PANEL
DRVVT: 30.5 s (ref 0.0–47.0)
PTT Lupus Anticoagulant: 32.3 s (ref 0.0–43.5)

## 2021-08-31 LAB — PHOSPHORUS: Phosphorus: 4.4 mg/dL (ref 2.5–4.6)

## 2021-08-31 LAB — BASIC METABOLIC PANEL
Anion gap: 7 (ref 5–15)
BUN: 20 mg/dL (ref 6–20)
CO2: 31 mmol/L (ref 22–32)
Calcium: 9.3 mg/dL (ref 8.9–10.3)
Chloride: 102 mmol/L (ref 98–111)
Creatinine, Ser: 1 mg/dL (ref 0.44–1.00)
GFR, Estimated: >60 mL/min (ref >60)
Glucose, Bld: 144 mg/dL — ABNORMAL HIGH (ref 70–99)
Potassium: 3.8 mmol/L (ref 3.5–5.1)
Sodium: 140 mmol/L (ref 135–145)

## 2021-08-31 LAB — ANCA TITERS
Atypical P-ANCA titer: <1:20 {titer}
C-ANCA: <1:20 {titer}
P-ANCA: <1:20 {titer}

## 2021-08-31 LAB — HOMOCYSTEINE: Homocysteine: 7 umol/L (ref 0.0–14.5)

## 2021-08-31 LAB — CA 19-9 (SERIAL): CA 19-9: 32 U/mL (ref 0–35)

## 2021-08-31 LAB — MAGNESIUM: Magnesium: 2.3 mg/dL (ref 1.7–2.4)

## 2021-08-31 LAB — C4 COMPLEMENT: Complement C4, Body Fluid: 33 mg/dL (ref 12–38)

## 2021-08-31 LAB — C3 COMPLEMENT: C3 Complement: 153 mg/dL (ref 82–167)

## 2021-08-31 LAB — FACTOR 5 ASSAY: Factor V Activity: 121 % (ref 70–150)

## 2021-08-31 NOTE — TOC Initial Note (Signed)
Transition of Care [facility]) - Initial/Assessment Note    Patient Details  Name: Elta Angell Note Lisby MRN: [id number] Date of Birth: 1971/01/20  Transition of Care [facility]) CM/SW Contact:    Truddie Hidden, RN Phone Number: 08/31/2021, 10:19 AM  Clinical Narrative:                  Transition of Care [facility]) Screening Note   Patient Details  Name: Yevonne Yokum Note [name] Date of Birth: 1970-03-19   Transition of Care [facility]) CM/SW Contact:    Truddie Hidden, RN Phone Number: 08/31/2021, 10:19 AM    Transition of Care Department [facility]) has reviewed patient and no TOC needs have been identified at this time. We will continue to monitor patient advancement through interdisciplinary progression rounds. If new patient transition needs arise, please place a TOC consult.          Patient Goals and CMS Choice        Expected Discharge Plan and Services                                                Prior Living Arrangements/Services                       Activities of Daily Living      Permission Sought/Granted                  Emotional Assessment              Admission diagnosis:  Acute ischemic stroke [facility]) [I63.9] Acute CVA (cerebrovascular accident) (HCC) [I63.9] Cerebral infarction, unspecified mechanism (HCC) [I63.9] Patient Active Problem List   Diagnosis Date Noted   Essential hypertension 08/30/2021   Acute CVA (cerebrovascular accident) (HCC) 08/29/2021   Cerebrovascular accident (CVA) (HCC)    COVID-19 long hauler manifesting chronic fatigue 03/21/2020   Orthostatic hypotension 02/23/2020   Atypical chest pain 02/23/2020   Pneumonia due to COVID-19 virus 02/07/2020   Reactive airways dysfunction syndrome (HCC) 10/15/2019   Hyperfunction of pituitary gland, unspecified (HCC) 10/15/2019   Calculus of gallbladder without cholecystitis without obstruction 05/06/2019   Cyst of pancreas 05/06/2019   Right kidney stone 05/06/2019    Leukocytoclastic vasculitis (HCC) 10/07/2018   Chronic insomnia 02/09/2018   History of anemia 02/09/2018   Sleep apnea 09/24/2017   Hyperlipidemia 05/30/2017   Migraine without aura and without status migrainosus, not intractable 05/27/2017   Pain in right knee 01/21/2017   Psychogenic nonepileptic seizure    Chronic pain syndrome    Restless leg syndrome    Chronic prescription benzodiazepine use 01/20/2016   Leukocytosis 01/20/2016   Seizure-like activity (HCC)    GAD (generalized anxiety disorder) 02/14/2015   Chronic neck pain 02/14/2015   History of hysterectomy 02/14/2015   Iron deficiency anemia due to chronic blood loss 08/29/2014   Insomnia, persistent 08/14/2014   Major depression (HCC) 08/14/2014   Temporomandibular joint sounds on opening and/or closing the jaw 08/14/2014   Degenerative disc disease, lumbar 08/14/2014   Bleeding internal hemorrhoids 08/14/2014   Gastric reflux 08/14/2014   Irritable bowel syndrome with constipation 08/14/2014   Hypertriglyceridemia 08/14/2014   Overweight 08/14/2014   Tinnitus 08/14/2014   Vitamin D deficiency 08/14/2014   Sinus tachycardia 11/25/2012   DOE (dyspnea on exertion) 11/06/2012   PCP:  Alba Cory, MD  Pharmacy:   [id number] [place], [place] - [address] AT [facility] [address] [facility] [address] [place] [address] Phone: [phone] Fax: [phone]  [facility] [address] [place] [address] Phone: [phone] Fax: [phone]     Social Determinants of Health (SDOH) Interventions    Readmission Risk Interventions     No data to display

## 2021-08-31 NOTE — Discharge Summary (Signed)
Physician Discharge Summary   Patient: Alicia [name] [name] MRN: [id number] DOB: 1970-06-18  Admit date:     08/29/2021  Discharge date: 08/31/21  Discharge Physician: Sharen Hones   PCP: Steele Sizer, MD   Recommendations at discharge:   Follow-up with PCP in 1 week. Follow-up with a neurologist in 1 month  Discharge Diagnoses: Principal Problem:   Acute CVA (cerebrovascular accident) ([place]) Active Problems:   Migraine without aura and without status migrainosus, not intractable   Hyperlipidemia   Essential hypertension  Resolved Problems:   * No resolved hospital problems. [facility] Course: Patient is a 51 year old female with history of essential hypertension, dyslipidemia, migraine headache, psychogenic nonepileptic seizure, obstructive sleep apnea on CPAP, restless leg syndrome and a prior stroke who admitted to the hospital on 7/12 with acute onset of difficulty speaking, confusion and left-sided weakness.  Initial CT scan did not show new stroke.  However patient blood pressure was over 204/122.  Patient was given TNK infusion.  Post infusion, MRI did not show acute stroke.  Assessment and Plan: Acute stroke status post thrombolytic therapy. Essential hypertension. Dyslipidemia. Patient condition had improved, it appears that a thrombolytic therapy was effective, patient did not develop a stroke.  Discussed with Dr. Cheral Marker, patient condition has improved, currently asymptomatic.  Dr. Cheral Marker does not believe dual antiplatelets treatment will be helpful in this patient as he is not certain patient actually has a new stroke.  Continue Plavix for now.  Patient need to follow-up with neurology and PCP as outpatient   Severe frontal headache. Condition has improved.  CT scan yesterday no acute changes  Sleep apnea. CPAP while asleep        Consultants: Neurology Procedures performed: None  Disposition: Home Diet recommendation:  Discharge Diet Orders (From  admission, onward)     Start     Ordered   08/31/21 0000  Diet - low sodium heart healthy        08/31/21 1042           Cardiac diet DISCHARGE MEDICATION: Allergies as of 08/31/2021       Reactions   Azithromycin Diarrhea   Penicillins Hives, Swelling   Has patient had a PCN reaction causing immediate rash, facial/tongue/throat swelling, SOB or lightheadedness with hypotension: Yes Has patient had a PCN reaction causing severe rash involving mucus membranes or skin necrosis: No Has patient had a PCN reaction that required hospitalization No Has patient had a PCN reaction occurring within the last 10 years: No If all of the above answers are "NO", then may proceed with Cephalosporin use.        Medication List     TAKE these medications    ALPRAZolam 1 MG 24 hr tablet Commonly known as: XANAX XR Take 1 tablet (1 mg total) by mouth every morning.   Breo Ellipta 100-25 MCG/ACT Aepb Generic drug: fluticasone furoate-vilanterol Inhale 1 puff into the lungs daily.   clopidogrel 75 MG tablet Commonly known as: PLAVIX Take 75 mg by mouth daily.   cyclobenzaprine 10 MG tablet Commonly known as: FLEXERIL TAKE 1 TABLET(10 MG) BY MOUTH AT BEDTIME   Dapsone 7.5 % Gel Commonly known as: Aczone Apply 1 application. topically as directed. Qd to bid to aa face   digoxin 0.125 MG tablet Commonly known as: LANOXIN Take 1 tablet (0.125 mg total) by mouth daily.   hydrocortisone 2.5 % cream Apply topically 3 (three) times a week. Apply to scaly areas face 3 times weekly  Tuesday, Thursday, Saturday   lamoTRIgine 100 MG tablet Commonly known as: LAMICTAL Take 100 mg by mouth 2 (two) times daily.   midodrine 5 MG tablet Commonly known as: PROAMATINE Take 1 tablet (5 mg) by mouth three times a day at 7 am, 11 am, & 3 pm   pantoprazole 40 MG tablet Commonly known as: PROTONIX Take 1 tablet (40 mg total) by mouth every morning.   PARoxetine 25 MG 24 hr tablet Commonly  known as: PAXIL-CR TAKE 2 TABLETS(50 MG) BY MOUTH DAILY   pimecrolimus 1 % cream Commonly known as: Elidel Apply topically 2 (two) times daily.   rosuvastatin 40 MG tablet Commonly known as: CRESTOR Take 1 tablet (40 mg total) by mouth daily. In place of Atorvastatin   sodium chloride 1 g tablet Take 1 tablet (1 g total) by mouth 3 (three) times daily.   Ubrelvy 100 MG Tabs Generic drug: Ubrogepant Take 100 mg by mouth daily as needed (migraine).   Vitamin D (Ergocalciferol) 1.25 MG (50000 UNIT) Caps capsule Commonly known as: DRISDOL TAKE 1 CAPSULE BY MOUTH EVERY 7 DAYS   zolpidem 12.5 MG CR tablet Commonly known as: Ambien CR Take 1 tablet (12.5 mg total) by mouth at bedtime as needed for sleep.        Follow-up Information     Steele Sizer, MD Follow up in 1 week(s).   Specialty: Family Medicine Contact information: [place] [address] [phone]         Kate Sable, MD .   Specialties: Cardiology, Radiology Contact information: [place] [address] [phone]                Discharge Exam: Danley Danker Weights   08/29/21 1053 08/29/21 1230 08/30/21 0427  Weight: 88.3 kg 85.3 kg 88 kg   General exam: Appears calm and comfortable  Respiratory system: Clear to auscultation. Respiratory effort normal. Cardiovascular system: S1 & S2 heard, RRR. No JVD, murmurs, rubs, gallops or clicks. No pedal edema. Gastrointestinal system: Abdomen is nondistended, soft and nontender. No organomegaly or masses felt. Normal bowel sounds heard. Central nervous system: Alert and oriented. No focal neurological deficits. Extremities: Symmetric 5 x 5 power. Skin: No rashes, lesions or ulcers Psychiatry: Judgement and insight appear normal. Mood & affect appropriate.    Condition at discharge: good  The results of significant diagnostics from this hospitalization (including imaging, microbiology, ancillary and  laboratory) are listed below for reference.   Imaging Studies: CT HEAD WO CONTRAST (5MM)  Result Date: 08/30/2021 CLINICAL DATA:  Headache, sudden, severe EXAM: CT HEAD WITHOUT CONTRAST TECHNIQUE: Contiguous axial images were obtained from the base of the skull through the vertex without intravenous contrast. RADIATION DOSE REDUCTION: This exam was performed according to the departmental dose-optimization program which includes automated exposure control, adjustment of the mA and/or kV according to patient size and/or use of iterative reconstruction technique. COMPARISON:  CT head 08/29/2021. FINDINGS: Brain: No evidence of acute infarction, hemorrhage, hydrocephalus, extra-axial collection or mass lesion/mass effect. Vascular: No hyperdense vessel identified. Skull: No acute fracture. Sinuses/Orbits: Clear visualized sinuses. No acute orbital findings. Other: No mastoid effusions. IMPRESSION: 1. No evidence of acute intracranial abnormality. 2. Partially empty sella, which is often a normal anatomic variant but can be associated with idiopathic intracranial hypertension (pseudotumor cerebri). Electronically Signed   By: Margaretha Sheffield M.D.   On: 08/30/2021 14:40   MR BRAIN WO CONTRAST  Result Date: 08/30/2021 CLINICAL DATA:  Follow-up examination for  stroke. EXAM: MRI HEAD WITHOUT CONTRAST TECHNIQUE: Multiplanar, multiecho pulse sequences of the brain and surrounding structures were obtained without intravenous contrast. COMPARISON:  Prior CTs from 08/29/2021 as well as previous MRI from 11/03/2018. FINDINGS: Brain: Cerebral volume within normal limits. Small remote lacunar infarct noted involving the right thalamus. No other significant cerebral white matter disease. No evidence for acute or subacute infarct. Gray-white matter differentiation otherwise maintained. No other areas of remote cortical infarction. No acute intracranial hemorrhage. Single punctate chronic microhemorrhage noted at the left  ventral midbrain, of doubtful significance in isolation. No mass lesion, midline shift or mass effect. No hydrocephalus or extra-axial fluid collection. Pituitary gland and suprasellar region within normal limits. Vascular: Major intracranial vascular flow voids are maintained. Skull and upper cervical spine: Craniocervical junction within normal limits. Bone marrow signal intensity normal. No scalp soft tissue abnormality. Sinuses/Orbits: Globes and orbital soft tissues within normal limits. Mild scattered mucosal thickening noted about the sphenoethmoidal sinuses. Paranasal sinuses are otherwise clear. No mastoid effusion. Other: None. IMPRESSION: 1. No acute intracranial abnormality. 2. Small remote lacunar infarct involving the right thalamus. Electronically Signed   By: Rise Mu M.D.   On: 08/30/2021 05:14   DG Abd 1 View  Result Date: 08/29/2021 CLINICAL DATA:  Abdominal pain EXAM: ABDOMEN - 1 VIEW COMPARISON:  01/26/2021 FINDINGS: Bowel gas pattern is nonspecific. Surgical clips are seen in gallbladder fossa. No abnormal masses or calcifications are seen. There is contrast in the lumen of the urinary bladder from previous CT. IMPRESSION: Nonspecific bowel gas pattern. Electronically Signed   By: Ernie Avena M.D.   On: 08/29/2021 18:38   CT ANGIO HEAD NECK W WO CM W PERF (CODE STROKE)  Result Date: 08/29/2021 CLINICAL DATA:  Code stroke. 51 year old female with left side weakness. EXAM: CT ANGIOGRAPHY HEAD AND NECK TECHNIQUE: Multidetector CT imaging of the head and neck was performed using the standard protocol during bolus administration of intravenous contrast. Multiplanar CT image reconstructions and MIPs were obtained to evaluate the vascular anatomy. Carotid stenosis measurements (when applicable) are obtained utilizing NASCET criteria, using the distal internal carotid diameter as the denominator. RADIATION DOSE REDUCTION: This exam was performed according to the departmental  dose-optimization program which includes automated exposure control, adjustment of the mA and/or kV according to patient size and/or use of iterative reconstruction technique. CONTRAST:  50mL OMNIPAQUE IOHEXOL 350 MG/ML SOLN COMPARISON:  Plain head CT 1018 hours today.  Brain MRI 11/03/2018. CTA head and neck 09/27/2017. FINDINGS: CTA NECK Skeleton: No acute osseous abnormality identified. Upper chest: Low lung volumes with patchy bilateral upper lung opacity probably reflecting atelectasis. No apical pleural effusion or superior mediastinal lymphadenopathy. Other neck: Negative. Aortic arch: 3 vessel arch configuration.  No arch atherosclerosis. Right carotid system: No significant plaque or stenosis. Tortuous right ICA below the skull base. Left carotid system: Similar tortuosity. No significant plaque or stenosis. Vertebral arteries: Negative. Fairly codominant cervical vertebral arteries. Mild tortuosity of the proximal left subclavian artery. CTA HEAD Posterior circulation: Codominant distal vertebral arteries with no plaque or stenosis. Patent PICA origins. Patent basilar artery, SCA and PCA origins. Posterior communicating arteries are diminutive or absent. Bilateral PCA branches are within normal limits. Anterior circulation: Both ICA siphons are patent. No significant siphon plaque or stenosis. A small left posterior communicating artery appears normal. Patent carotid termini, MCA and ACA origins. Diminutive or absent anterior communicating artery. Bilateral ACA branches are within normal limits. Left MCA M1 segment and bifurcation are patent without stenosis. Left  MCA branches are within normal limits. Right MCA M1 segment and trifurcation are patent. No right MCA branch occlusion or stenosis is identified. And compared to 2019 right MCA branches appear stable and within normal limits. Venous sinuses: Patent. Anatomic variants: None. Review of the MIP images confirms the above findings IMPRESSION: 1.  Negative for large vessel occlusion. No significant atherosclerosis or stenosis in the head and neck. This was discussed by telephone with Dr. Kerney Elbe on 08/29/2021 at 10:44. 2. Low lung volumes with probable bilateral upper lung atelectasis. Electronically Signed   By: Genevie Ann M.D.   On: 08/29/2021 10:54   CT HEAD CODE STROKE WO CONTRAST  Result Date: 08/29/2021 CLINICAL DATA:  Code stroke. 51 year old female with left side weakness. EXAM: CT HEAD WITHOUT CONTRAST TECHNIQUE: Contiguous axial images were obtained from the base of the skull through the vertex without intravenous contrast. RADIATION DOSE REDUCTION: This exam was performed according to the departmental dose-optimization program which includes automated exposure control, adjustment of the mA and/or kV according to patient size and/or use of iterative reconstruction technique. COMPARISON:  Brain MRI 11/03/2018 and head CT 09/27/2017. FINDINGS: Brain: Small chronic lacunar infarct in the right thalamus was present in 2020. Elsewhere gray-white matter differentiation appears stable since 2019 and within normal limits. No midline shift, ventriculomegaly, mass effect, evidence of mass lesion, intracranial hemorrhage or evidence of cortically based acute infarction. Vascular: No suspicious intracranial vascular hyperdensity. Skull: No acute osseous abnormality identified. Sinuses/Orbits: Visualized paranasal sinuses and mastoids are clear. Other: No acute orbit or scalp soft tissue finding. ASPECTS [facility] Stroke Program Early CT Score) Total score (0-10 with 10 being normal): 10 IMPRESSION: No acute cortically based infarct or acute intracranial hemorrhage identified. ASPECTS 10. Small chronic lacunar infarct in the right thalamus. Study discussed by telephone with Dr. Cheral Marker on 08/29/2021 at 1026 hours. Electronically Signed   By: Genevie Ann M.D.   On: 08/29/2021 10:30   ECHOCARDIOGRAM COMPLETE  Result Date: 08/10/2021    ECHOCARDIOGRAM REPORT    Patient Name:   Dreanna VAN NOTE Debnam Date of Exam: 08/10/2021 Medical Rec #:  [id number]              Height:       68.0 in Accession #:    [id number]             Weight:       194.8 lb Date of Birth:  1970/02/20              BSA:          2.021 m Patient Age:    71 years               BP:           120/80 mmHg Patient Gender: F                      HR:           68 bpm. Exam Location:  [place] Procedure: 2D Echo, Cardiac Doppler and Color Doppler Indications:    I49.3 PVC; R06.02 SOB  History:        Patient has prior history of Echocardiogram examinations, most                 recent 02/24/2020. Stroke, Arrythmias:PVC,                 Signs/Symptoms:Shortness of Breath and Chest Pain; Risk  Factors:Non-Smoker, Dyslipidemia and Sleep Apnea. Negative TEE                 Bubble study 03/29/20.  Sonographer:    Quentin Ore RDMS, RVT, RDCS Referring Phys: [id number] IMPRESSIONS  1. Left ventricular ejection fraction, by estimation, is 60 to 65%. The left ventricle has normal function. The left ventricle has no regional wall motion abnormalities. Left ventricular diastolic parameters are consistent with Grade I diastolic dysfunction (impaired relaxation).  2. Right ventricular systolic function is normal. The right ventricular size is normal. Tricuspid regurgitation signal is inadequate for assessing PA pressure.  3. The mitral valve is normal in structure. No evidence of mitral valve regurgitation. No evidence of mitral stenosis.  4. The aortic valve is normal in structure. Aortic valve regurgitation is not visualized. No aortic stenosis is present.  5. The inferior vena cava is normal in size with greater than 50% respiratory variability, suggesting right atrial pressure of 3 mmHg.  6. Frequent PVCs noted. FINDINGS  Left Ventricle: Left ventricular ejection fraction, by estimation, is 60 to 65%. The left ventricle has normal function. The left ventricle has no regional wall motion abnormalities.  The left ventricular internal cavity size was normal in size. There is  no left ventricular hypertrophy. Left ventricular diastolic parameters are consistent with Grade I diastolic dysfunction (impaired relaxation). Right Ventricle: The right ventricular size is normal. No increase in right ventricular wall thickness. Right ventricular systolic function is normal. Tricuspid regurgitation signal is inadequate for assessing PA pressure. Left Atrium: Left atrial size was normal in size. Right Atrium: Right atrial size was normal in size. Pericardium: There is no evidence of pericardial effusion. Mitral Valve: The mitral valve is normal in structure. No evidence of mitral valve regurgitation. No evidence of mitral valve stenosis. Tricuspid Valve: The tricuspid valve is normal in structure. Tricuspid valve regurgitation is not demonstrated. No evidence of tricuspid stenosis. Aortic Valve: The aortic valve is normal in structure. Aortic valve regurgitation is not visualized. No aortic stenosis is present. Aortic valve mean gradient measures 4.0 mmHg. Aortic valve peak gradient measures 7.4 mmHg. Aortic valve area, by VTI measures 2.10 cm. Pulmonic Valve: The pulmonic valve was normal in structure. Pulmonic valve regurgitation is not visualized. No evidence of pulmonic stenosis. Aorta: The aortic root is normal in size and structure. Ascending aorta measurements are within normal limits for age when indexed to body surface area. Venous: The inferior vena cava is normal in size with greater than 50% respiratory variability, suggesting right atrial pressure of 3 mmHg. IAS/Shunts: No atrial level shunt detected by color flow Doppler.  LEFT VENTRICLE PLAX 2D LVIDd:         4.30 cm LVIDs:         3.00 cm LV PW:         0.60 cm LV IVS:        0.70 cm LVOT diam:     1.90 cm LV SV:         57 LV SV Index:   28 LVOT Area:     2.84 cm  LV Volumes (MOD) LV vol d, MOD A2C: 56.9 ml LV vol d, MOD A4C: 54.6 ml LV vol s, MOD A2C: 28.1  ml LV vol s, MOD A4C: 29.7 ml LV SV MOD A2C:     28.8 ml LV SV MOD A4C:     54.6 ml LV SV MOD BP:      29.6 ml RIGHT VENTRICLE  IVC RV S prime:     17.00 cm/s  IVC diam: 1.60 cm LEFT ATRIUM             Index        RIGHT ATRIUM           Index LA diam:        3.10 cm 1.53 cm/m   RA Area:     14.90 cm LA Vol (A2C):   41.9 ml 20.73 ml/m  RA Volume:   38.50 ml  19.05 ml/m LA Vol (A4C):   26.0 ml 12.86 ml/m LA Biplane Vol: 32.7 ml 16.18 ml/m  AORTIC VALVE                    PULMONIC VALVE AV Area (Vmax):    2.21 cm     PV Vmax:       1.00 m/s AV Area (Vmean):   2.02 cm     PV Peak grad:  4.0 mmHg AV Area (VTI):     2.10 cm AV Vmax:           136.00 cm/s AV Vmean:          94.000 cm/s AV VTI:            0.270 m AV Peak Grad:      7.4 mmHg AV Mean Grad:      4.0 mmHg LVOT Vmax:         106.00 cm/s LVOT Vmean:        67.000 cm/s LVOT VTI:          0.200 m LVOT/AV VTI ratio: 0.74  AORTA Ao Root diam: 3.20 cm Ao Asc diam:  3.20 cm Ao Arch diam: 2.5 cm MITRAL VALVE MV Area (PHT): 4.68 cm    SHUNTS MV Decel Time: 162 msec    Systemic VTI:  0.20 m MV E velocity: 75.80 cm/s  Systemic Diam: 1.90 cm MV A velocity: 93.40 cm/s MV E/A ratio:  0.81 Ida Rogue MD Electronically signed by Ida Rogue MD Signature Date/Time: 08/10/2021/5:31:31 PM    Final     Microbiology: Results for orders placed or performed during the hospital encounter of 08/29/21  MRSA Next Gen by PCR, Nasal     Status: None   Collection Time: 08/29/21  1:33 PM   Specimen: Nasal Mucosa; Nasal Swab  Result Value Ref Range Status   MRSA by PCR Next Gen NOT DETECTED NOT DETECTED Final    Comment: (NOTE) The GeneXpert MRSA Assay (FDA approved for NASAL specimens only), is one component of a comprehensive MRSA colonization surveillance program. It is not intended to diagnose MRSA infection nor to guide or monitor treatment for MRSA infections. Test performance is not FDA approved in patients less than 81 years old. Performed at  [facility], [address] [place], [place] [address]     Labs: CBC: Recent Labs  Lab 08/29/21 1024 08/30/21 0416  WBC 10.5 6.8  NEUTROABS 8.7*  --   HGB 12.9 12.6  HCT 39.0 37.8  MCV 89.9 90.9  PLT 281 [id number]   Basic Metabolic Panel: Recent Labs  Lab 08/24/21 1355 08/29/21 1024 08/30/21 0416 08/31/21 0627  NA 137 139 140 140  K 4.2 3.9 4.0 3.8  CL 96 101 105 102  CO2 26 25 31 31   GLUCOSE 104* 135* 117* 144*  BUN 14 16 17 20   CREATININE 0.93 0.96 0.97 1.00  CALCIUM 9.4 9.3 9.0 9.3  MG  --  1.9 2.2 2.3  PHOS  --  2.4* 3.4 4.4   Liver Function Tests: Recent Labs  Lab 08/24/21 1355 08/29/21 1024  AST 18 22  ALT 17 18  ALKPHOS 79 62  BILITOT 0.6 0.8  PROT 6.9 7.0  ALBUMIN 4.7 4.4   CBG: Recent Labs  Lab 08/29/21 1008 08/29/21 1227  GLUCAP 148* 127*    Discharge time spent: greater than 30 minutes.  Signed: Sharen Hones, MD [facility] 08/31/2021

## 2021-08-31 NOTE — Progress Notes (Signed)
Patient discharging home, instructions given to patient, verbalized understanding. Husband to transport patient home.

## 2021-08-31 NOTE — Progress Notes (Signed)
PHARMACY CONSULT NOTE   Pharmacy Consult for Electrolyte Monitoring and Replacement   Recent Labs: Potassium (mmol/L)  Date Value  08/31/2021 3.8  07/25/2011 4.0   Magnesium (mg/dL)  Date Value  72/62/0355 2.3   Calcium (mg/dL)  Date Value  97/41/6384 9.3   Albumin (g/dL)  Date Value  53/64/6803 4.4  08/24/2021 4.7   Phosphorus (mg/dL)  Date Value  21/22/4825 4.4   Sodium (mmol/L)  Date Value  08/31/2021 140  08/24/2021 137  07/25/2011 141   Assessment: 51 yo female w/ PMH of CVA w/ mild speech deficit, anemia, depression, anxiety, HLD, HTN, migraines, PNES, RLS and sleep apnea who presented to [facility] ER on 07/12 via EMS with acute CVA. Pharmacy is asked to follow and replace electrolytes while in CCU  Goal of Therapy:  Electrolytes WNL  Plan:  Electrolyte replacement not warranted today Pharmacy signing off electrolyte consult. We will continue monitor electrolytes peripherally. Please reconsult if warranted.  Selinda Eon ,PharmD Clinical Pharmacist 08/31/2021 8:36 AM

## 2021-08-31 NOTE — Discharge Instructions (Signed)
Follow-up with your neurologist in 1 month

## 2021-08-31 NOTE — Plan of Care (Signed)

## 2021-08-31 NOTE — Plan of Care (Signed)

## 2021-09-01 LAB — FACTOR 13 ACTIVITY: Factor XIII, Qualitative: NORMAL

## 2021-09-06 ENCOUNTER — Ambulatory Visit: Payer: BC Managed Care – PPO | Admitting: Nurse Practitioner

## 2021-09-06 ENCOUNTER — Encounter: Payer: Self-pay | Admitting: Nurse Practitioner

## 2021-09-06 ENCOUNTER — Other Ambulatory Visit: Payer: Self-pay

## 2021-09-06 VITALS — BP 122/76 | HR 102 | Temp 98.2°F | Resp 16 | Ht 69.0 in | Wt 188.9 lb

## 2021-09-06 DIAGNOSIS — R197 Diarrhea, unspecified: Secondary | ICD-10-CM | POA: Diagnosis not present

## 2021-09-06 DIAGNOSIS — Z8673 Personal history of transient ischemic attack (TIA), and cerebral infarction without residual deficits: Secondary | ICD-10-CM | POA: Diagnosis not present

## 2021-09-06 DIAGNOSIS — Z09 Encounter for follow-up examination after completed treatment for conditions other than malignant neoplasm: Secondary | ICD-10-CM

## 2021-09-06 NOTE — Progress Notes (Signed)
BP 122/76   Pulse (!) 102   Temp 98.2 F (36.8 C) (Oral)   Resp 16   Ht 5\' 9"  (1.753 m)   Wt 188 lb 14.4 oz (85.7 kg)   LMP 01/15/2015   SpO2 98%   BMI 27.90 kg/m    Subjective:    Patient ID: Alicia [name], female    DOB: Aug 05, 1970, 51 y.o.   MRN: [id number]  HPI: Alicia [name] is a 51 y.o. female  Chief Complaint  Patient presents with   Hospitalization [facility] follow up/history of ischemic stroke: She was seen in the emergency department at [facility] on 08/29/2021.  Was reported that that morning she had gotten up to go to work at 745 she shortly became acutely confused, having difficulty speaking and then was brought to the emergency department by EMS.  Patient does have a history of a CVA in 2018.  Which left her with mild left-sided deficits.  Upon arrival to the ER she was unable to follow commands and per physician note did not appear to comprehend requests.  They did a CTA of the head which showed no acute cortically based infarct or acute intracranial hemorrhage was identified.  There was small chronic lacunar infarct in the right thalamus.  She was given a TNK administration.  Then admitted to the ICU.  Patient was discharged on 08/31/2021.  It was recommended that she follow-up with her PCP in 1 week and her neurologist in 1 month.  She was instructed to continue restart Plavix in three weeks.  Other imaging was completed results will be below.  Ct head(08/29/2021): No acute cortically based infarct or acute intracranial hemorrhage identified. ASPECTS 10. Small chronic lacunar infarct in the right thalamus.  Ct angio head and neck:  Negative for large vessel occlusion. No significant atherosclerosis or stenosis in the head and neck.  Mri brain: 1. No acute intracranial abnormality. 2. Small remote lacunar infarct involving the right thalamus.  Ct head (08/30/2021): 1. No evidence of acute intracranial abnormality. 2.  Partially empty sella, which is often a normal anatomic variant but can be associated with idiopathic intracranial hypertension (pseudotumor cerebri).  She reports left sided face numbness, loss of appetite, and continued headache. She says she has been having a hard time eating since the stroke. She says that she can eat about 2.5 oz of yogurt but anything else she is having diarrhea.  She says she will get abdominal cramping and then the diarrhea. She says that she needs help showering and getting undressed. She is able to dress herself and feed herself. She says that she has been getting help with her husband.  She has an appointment with neurology on 10/26/2021. She is going to call and get an appointment for her hospital follow up.  She says that she would like to discuss disability options with Dr. Ancil Boozer.   Relevant past medical, surgical, family and social history reviewed and updated as indicated. Interim medical history since our last visit reviewed. Allergies and medications reviewed and updated.  Review of Systems  Constitutional: Negative for fever or weight change.  Respiratory: Negative for cough and shortness of breath.   Cardiovascular: Negative for chest pain or palpitations.  Gastrointestinal: Negative for abdominal pain, no bowel changes.  Musculoskeletal: Negative for gait problem or joint swelling.  Skin: Negative for rash.  Neurological: Negative for dizziness, positive for headache.  No other specific complaints in a complete review  of systems (except as listed in HPI above).      Objective:    BP 122/76   Pulse (!) 102   Temp 98.2 F (36.8 C) (Oral)   Resp 16   Ht 5\' 9"  (1.753 m)   Wt 188 lb 14.4 oz (85.7 kg)   LMP 01/15/2015   SpO2 98%   BMI 27.90 kg/m   Wt Readings from Last 3 Encounters:  09/06/21 188 lb 14.4 oz (85.7 kg)  08/30/21 194 lb 0.1 oz (88 kg)  08/23/21 196 lb (88.9 kg)    Physical Exam  Constitutional: Patient appears well-developed and  well-nourished. Overweight, No distress.  HEENT: head atraumatic, normocephalic, pupils equal and reactive to light, neck supple, throat within normal limits Cardiovascular: Normal rate, regular rhythm and normal heart sounds.  No murmur heard. No BLE edema. Pulmonary/Chest: Effort normal and breath sounds normal. No respiratory distress. Abdominal: Soft.  There is no tenderness. Neuro: equal grip strength, left side facial decrease sensation Psychiatric: Patient has a normal mood and affect. behavior is normal. Judgment and thought content normal.  Results for orders placed or performed during the hospital encounter of 08/29/21  MRSA Next Gen by PCR, Nasal   Specimen: Nasal Mucosa; Nasal Swab  Result Value Ref Range   MRSA by PCR Next Gen NOT DETECTED NOT DETECTED  Protime-INR  Result Value Ref Range   Prothrombin Time 13.0 11.4 - 15.2 seconds   INR 1.0 0.8 - 1.2  APTT  Result Value Ref Range   aPTT 26 24 - 36 seconds  CBC  Result Value Ref Range   WBC 10.5 4.0 - 10.5 K/uL   RBC 4.34 3.87 - 5.11 MIL/uL   Hemoglobin 12.9 12.0 - 15.0 g/dL   HCT 10/30/21 25.9 - 56.3 %   MCV 89.9 80.0 - 100.0 fL   MCH 29.7 26.0 - 34.0 pg   MCHC 33.1 30.0 - 36.0 g/dL   RDW 87.5 64.3 - 32.9 %   Platelets 281 150 - 400 K/uL   nRBC 0.0 0.0 - 0.2 %  Differential  Result Value Ref Range   Neutrophils Relative % 83 %   Neutro Abs 8.7 (H) 1.7 - 7.7 K/uL   Lymphocytes Relative 12 %   Lymphs Abs 1.3 0.7 - 4.0 K/uL   Monocytes Relative 4 %   Monocytes Absolute 0.4 0.1 - 1.0 K/uL   Eosinophils Relative 0 %   Eosinophils Absolute 0.0 0.0 - 0.5 K/uL   Basophils Relative 1 %   Basophils Absolute 0.1 0.0 - 0.1 K/uL   Immature Granulocytes 0 %   Abs Immature Granulocytes 0.03 0.00 - 0.07 K/uL  Comprehensive metabolic panel  Result Value Ref Range   Sodium 139 135 - 145 mmol/L   Potassium 3.9 3.5 - 5.1 mmol/L   Chloride 101 98 - 111 mmol/L   CO2 25 22 - 32 mmol/L   Glucose, Bld 135 (H) 70 - 99 mg/dL   BUN  16 6 - 20 mg/dL   Creatinine, Ser 51.8 0.44 - 1.00 mg/dL   Calcium 9.3 8.9 - 8.41 mg/dL   Total Protein 7.0 6.5 - 8.1 g/dL   Albumin 4.4 3.5 - 5.0 g/dL   AST 22 15 - 41 U/L   ALT 18 0 - 44 U/L   Alkaline Phosphatase 62 38 - 126 U/L   Total Bilirubin 0.8 0.3 - 1.2 mg/dL   GFR, Estimated 66.0 >63 mL/min   Anion gap 13 5 - 15  Ethanol  Result Value Ref Range   Alcohol, Ethyl (B) <10 <10 mg/dL  HIV Antibody (routine testing w rflx)  Result Value Ref Range   HIV Screen 4th Generation wRfx Non Reactive Non Reactive  Magnesium  Result Value Ref Range   Magnesium 1.9 1.7 - 2.4 mg/dL  Phosphorus  Result Value Ref Range   Phosphorus 2.4 (L) 2.5 - 4.6 mg/dL  ANCA titers  Result Value Ref Range   C-ANCA <1:20 Neg:<1:20 titer   P-ANCA <1:20 Neg:<1:20 titer   Atypical P-ANCA titer <1:20 Neg:<1:20 titer  C3 complement  Result Value Ref Range   C3 Complement 153 82 - 167 mg/dL  C4 complement  Result Value Ref Range   Complement C4, Body Fluid 33 12 - 38 mg/dL  CA 02-5 (SERIAL)  Result Value Ref Range   CA 19-9 32 0 - 35 U/mL  Glucose, capillary  Result Value Ref Range   Glucose-Capillary 127 (H) 70 - 99 mg/dL  Lipid panel  Result Value Ref Range   Cholesterol 178 0 - 200 mg/dL   Triglycerides 852 (H) <150 mg/dL   HDL 42 >77 mg/dL   Total CHOL/HDL Ratio 4.2 RATIO   VLDL 50 (H) 0 - 40 mg/dL   LDL Cholesterol 86 0 - 99 mg/dL  Hemoglobin O2U  Result Value Ref Range   Hgb A1c MFr Bld 5.8 (H) 4.8 - 5.6 %   Mean Plasma Glucose 119.76 mg/dL  Factor 13 activity  Result Value Ref Range   Factor XIII, Qualitative NORMAL No Lysis/24  Factor 5 assay  Result Value Ref Range   Factor V Activity 121 70 - 150 %  Homocysteine  Result Value Ref Range   Homocysteine 7.0 0.0 - 14.5 umol/L  Lupus anticoagulant panel  Result Value Ref Range   PTT Lupus Anticoagulant 32.3 0.0 - 43.5 sec   DRVVT 30.5 0.0 - 47.0 sec   Lupus Anticoag Interp Comment:   CBC  Result Value Ref Range   WBC 6.8 4.0  - 10.5 K/uL   RBC 4.16 3.87 - 5.11 MIL/uL   Hemoglobin 12.6 12.0 - 15.0 g/dL   HCT 23.5 36.1 - 44.3 %   MCV 90.9 80.0 - 100.0 fL   MCH 30.3 26.0 - 34.0 pg   MCHC 33.3 30.0 - 36.0 g/dL   RDW 15.4 00.8 - 67.6 %   Platelets 235 150 - 400 K/uL   nRBC 0.0 0.0 - 0.2 %  Basic metabolic panel  Result Value Ref Range   Sodium 140 135 - 145 mmol/L   Potassium 4.0 3.5 - 5.1 mmol/L   Chloride 105 98 - 111 mmol/L   CO2 31 22 - 32 mmol/L   Glucose, Bld 117 (H) 70 - 99 mg/dL   BUN 17 6 - 20 mg/dL   Creatinine, Ser 1.95 0.44 - 1.00 mg/dL   Calcium 9.0 8.9 - 09.3 mg/dL   GFR, Estimated >26 >71 mL/min   Anion gap 4 (L) 5 - 15  Magnesium  Result Value Ref Range   Magnesium 2.2 1.7 - 2.4 mg/dL  Phosphorus  Result Value Ref Range   Phosphorus 3.4 2.5 - 4.6 mg/dL  Basic metabolic panel  Result Value Ref Range   Sodium 140 135 - 145 mmol/L   Potassium 3.8 3.5 - 5.1 mmol/L   Chloride 102 98 - 111 mmol/L   CO2 31 22 - 32 mmol/L   Glucose, Bld 144 (H) 70 - 99 mg/dL   BUN 20 6 - 20 mg/dL  Creatinine, Ser 1.00 0.44 - 1.00 mg/dL   Calcium 9.3 8.9 - 10.3 mg/dL   GFR, Estimated >60 >60 mL/min   Anion gap 7 5 - 15  Magnesium  Result Value Ref Range   Magnesium 2.3 1.7 - 2.4 mg/dL  Phosphorus  Result Value Ref Range   Phosphorus 4.4 2.5 - 4.6 mg/dL  CBG monitoring, ED  Result Value Ref Range   Glucose-Capillary 148 (H) 70 - 99 mg/dL  POC urine preg, ED  Result Value Ref Range   Preg Test, Ur Negative Negative  Troponin I (High Sensitivity)  Result Value Ref Range   Troponin I (High Sensitivity) 4 <18 ng/L      Assessment & Plan:   Problem List Items Addressed This Visit       Other   History of ischemic stroke - Primary    Patient reports that she has improved.  Patient states she still has deficits with left-sided facial numbness and headache. She still needs to follow up with her neurologist. She is going to call and make an appointment.       Other Visit Diagnoses      Diarrhea, unspecified type       She reports that diarrhea is started after hospitalization.  We will check for C. difficile.   Relevant Orders   Clostridium difficile culture-fecal   Hospital discharge follow-up       Patient is going to call her neurologist and get a follow-up appointment.        Follow up plan: Return for has appointment with Dr. Ancil Boozer next week.

## 2021-09-06 NOTE — Assessment & Plan Note (Signed)
Patient reports that she has improved.  Patient states she still has deficits with left-sided facial numbness and headache. She still needs to follow up with her neurologist. She is going to call and make an appointment.

## 2021-09-11 ENCOUNTER — Ambulatory Visit: Payer: BC Managed Care – PPO | Admitting: Family Medicine

## 2021-09-12 NOTE — Progress Notes (Unsigned)
Patient Care Team: Alba Cory, MD as PCP - General (Family Medicine) Debbe Odea, MD as PCP - Cardiology (Cardiology) Wyline Mood, MD as Consulting Physician (Gastroenterology) Salena Saner, MD as Consulting Physician (Pulmonary Disease) Lonell Face, MD as Consulting Physician (Neurology) Dayna Barker, MD as Referring Physician (Internal Medicine)   HPI  Alicia [name] Kilker is a 51 y.o. female Who suffered a cryptogenic stroke 2018 imaging demonstrated small cerebellar and occipital infarcts concerning for embolic origin; she underwent loop recorder implantation (WC) 70; hypercoagulable work-up was negative.  She ended up with a TEE with question of a interatrial septal aneurysm.  Bubble study was negative.  Because of equivocal results, she underwent TCD which was negative Recurrent stroke 1/22  w/u neg  Seen by Dr. Corrin Parker 7/22 with notes complaining of orthostasis and inappropriate tachycardia.  Antecedent history of COVID-pneumonia 12/21 managed with remdesivir; symptoms however a to date COVID.  Long history of shower intolerance, heat intolerance, orthostatic intolerance, exercise intolerance with tachypalpitations  Recurrent syncope 4/23   Spells occur with prolonged sitting and are associated with the need to put her head down and then she falls asleep or drifts out but cannot keep her focus.  When she is working at home and is able to lie down these are not associated with loss of consciousness.  She does not have diaphoresis or flushing with these.  There is mild residual fatigue.  Continues with significant shower intolerance.  Orthostatic lightheadedness is much improved on the ProAmatine.  The associated lightheadedness prior to the use of the ProAmatine is very much like the lightheadedness with these events.  Her diet is replete of fluid and improved with salt.   History of PVCs.  Monitor 1/23 showed less than 1%.  Worsening  dyspnea.     DATE TEST EF   11/18 Echo   55-60 %   2/22 TEE    4/23 Myoview  80% No Ischemia "Attenuation images-no evidence of coronary calcifications   Date Cr K Hgb DIG  9/22 1.01 4.4 15.0   1.9 (12/22)           Records and Results Reviewed  Past Medical History:  Diagnosis Date   Anemia    Anxiety    Depression    Dyspnea    SINCE STROKE   GERD (gastroesophageal reflux disease)    Hyperlipidemia    Hypertension    IBS (irritable bowel syndrome)    Migraine    Previous cesarean delivery, delivered, with or without mention of antepartum condition 01/19/2012   Psychogenic nonepileptic seizure    hx/notes 12/19/2016-LAST SEIZURE IN 2019-ON NO MEDS AS OF 05-24-19   Restless leg syndrome    Sleep apnea    USES CPAP   Stroke (HCC) 12/18/2016   Acute arterial ischemic stroke, multifocal, posterior circulation /notes 12/19/2016-LEFT SIDED WEAKNESS, MEMORY FATIGUE AND TROUBLE FINDING HER WORDS [facility]    Past Surgical History:  Procedure Laterality Date   ABDOMINOPLASTY  Feb. 2015   CESAREAN SECTION  2011   CHOLECYSTECTOMY N/A 05/31/2019   Procedure: LAPAROSCOPIC CHOLECYSTECTOMY WITH INTRAOPERATIVE CHOLANGIOGRAM;  Surgeon: Earline Mayotte, MD;  Location: [facility] ORS;  Service: General;  Laterality: N/A;   COLONOSCOPY WITH PROPOFOL N/A 11/26/2016   Procedure: COLONOSCOPY WITH PROPOFOL;  Surgeon: Wyline Mood, MD;  Location: [facility] ENDOSCOPY;  Service: Gastroenterology;  Laterality: N/A;   COLONOSCOPY WITH PROPOFOL N/A 08/08/2020   Procedure: COLONOSCOPY WITH PROPOFOL;  Surgeon: Regis Bill, MD;  Location:  [place] ENDOSCOPY;  Service: Endoscopy;  Laterality: N/A;   CYSTOSCOPY  02/02/2015   Procedure: CYSTOSCOPY;  Surgeon: Gae Dry, MD;  Location: [facility] ORS;  Service: Gynecology;;   DILATION AND CURETTAGE OF UTERUS  2003, 2005, 2008   ESOPHAGOGASTRODUODENOSCOPY N/A 05/31/2019   Procedure: ESOPHAGOGASTRODUODENOSCOPY (EGD);  Surgeon: Robert Bellow, MD;  Location: [facility]  ORS;  Service: General;  Laterality: N/A;  in the O.R.   ESOPHAGOGASTRODUODENOSCOPY (EGD) WITH PROPOFOL N/A 11/26/2016   Procedure: ESOPHAGOGASTRODUODENOSCOPY (EGD) WITH PROPOFOL;  Surgeon: Jonathon Bellows, MD;  Location: [facility] ENDOSCOPY;  Service: Gastroenterology;  Laterality: N/A;   HERNIA REPAIR  [id number]   Umbilical   KNEE ARTHROSCOPY Right 2006   LAPAROSCOPIC BILATERAL SALPINGECTOMY Bilateral 02/02/2015   Procedure: LAPAROSCOPIC BILATERAL SALPINGECTOMY;  Surgeon: Gae Dry, MD;  Location: [facility] ORS;  Service: Gynecology;  Laterality: Bilateral;   LAPAROSCOPIC HYSTERECTOMY N/A 02/02/2015   Procedure: HYSTERECTOMY TOTAL LAPAROSCOPIC;  Surgeon: Gae Dry, MD;  Location: [facility] ORS;  Service: Gynecology;  Laterality: N/A;   LOOP RECORDER INSERTION N/A 12/20/2016   Procedure: LOOP RECORDER INSERTION;  Surgeon: Constance Haw, MD;  Location: [place] CV LAB;  Service: Cardiovascular;  Laterality: N/A;   REDUCTION MAMMAPLASTY Bilateral December 2015   TEE WITHOUT CARDIOVERSION N/A 12/20/2016   Procedure: TRANSESOPHAGEAL ECHOCARDIOGRAM (TEE);  Surgeon: Cathrine Spark, MD;  Location: [place];  Service: Cardiovascular;  Laterality: N/A;   TEE WITHOUT CARDIOVERSION N/A 03/29/2020   Procedure: TRANSESOPHAGEAL ECHOCARDIOGRAM (TEE);  Surgeon: Kate Sable, MD;  Location: [facility] ORS;  Service: Cardiovascular;  Laterality: N/A;    No outpatient medications have been marked as taking for the 09/13/21 encounter (Appointment) with Deboraha Sprang, MD.    Allergies  Allergen Reactions   Azithromycin Diarrhea   Penicillins Hives and Swelling    Has patient had a PCN reaction causing immediate rash, facial/tongue/throat swelling, SOB or lightheadedness with hypotension: Yes Has patient had a PCN reaction causing severe rash involving mucus membranes or skin necrosis: No Has patient had a PCN reaction that required hospitalization No Has patient had a PCN reaction occurring within the  last 10 years: No If all of the above answers are "NO", then may proceed with Cephalosporin use.      Review of Systems negative except from HPI and PMH  Physical Exam LMP 01/15/2015  Well developed and nourished in no acute distress HENT normal Neck supple with JVP-  flat   Clear Irregular rate and rhythm, no murmurs or gallops Abd-soft with active BS No Clubbing cyanosis edema Skin-warm and dry A & Oriented  Grossly normal sensory and motor function  ECG sinus at 97 of intervals 15/07/36     Assessment and  Plan  Cryptogenic stroke  Aortic septal aneurysm without PFO  Dysautonomia-- inappropriate sinus tachycardia/orthostatic intolerance  PVCs  Chest pain   Recurrent strokes on clopidogrel.  Also on rosuvastatin appropriately for stroke risk reduction-- myoview neg  Lightheadedness and orthostatic tachycardia likely reflect hypotension as the mechanism of her events and her blood pressure is running in the 90s.  We will increase her sodium from 1.2 g a day to 2.4 g a day by having her take 2 sodium chloride tablets twice a day.  Her diet is fluid replete.  We discussed the role of compression of thigh and abdomen and reviewed this on the Internet.  We will give her about 4 weeks and if this does not suffice, we will add low-dose fludrocortisone.  Her PVC burden is significantly  increased on auscultation electrocardiogram from what was noted previously.  We will requantitate.  It may be contributing to her dyspnea and so we will get a repeat echocardiogram       Sherryl Manges, MD 09/12/2021 9:40 PM

## 2021-09-12 NOTE — Progress Notes (Unsigned)
Name: Alicia [name] Note Guilbault   MRN: [id number]    DOB: February 10, 1971   Date:09/13/2021       Progress Note  Subjective  Chief Complaint  Follow Up  HPI  CVA with hemiparesis : 12/18/2016, admitted to [facility] , mild distal left PICA. She is feeling better, but still has lack of balance but not using a cane, episodes of dysarthria, continues to have tremors, and has some left side weakness .  She still has some short term memory difficulties and difficulty finding the correct words  to express herself at times.  She had at least 4 seizure like activities first one in 2018 ,  09/2017, had one in April after surgery 05/2019, she states had a similar episode Fall 2022 .She states when at work and super tired she falls asleep on her desk. She feels very sleepy and lethargic and has to put her head down, it can last 8-9 minutes and when she wakes up she is able to function. At times she has episodes of nausea, gets confused and has to rest She  is taking Crestor. She recently went to [facility] - Dr. Gevena Cotton  02/2021 , had repeat MRI and advised to stop aspirin and start Plavix  since studies showed not responsive to aspirin, there is still a concern for mild FMD in her cervical ICA and vertebral arteries. Possible PFO but cardiac MRI negative for cardio embolic source. She had another episode of confusion and left side weakness on 07/12 but CTA, MRI and CT all negative except for old lacunar infarct - she states still feeling weak on left side and some dysarthria explained not able to explain her symptoms since no structural damage to the brain and advised follow up with neurologist.  She states she is thinking about disability. Explained she needs to contact a lawyer to start paperwork, but she will need to be unable to work  before she can apply . She states she seem to get symptoms when under more stress and work is stressful   Summary of transcranial doppler 05/30/2020 No HITS at rest or during  Valsalva. Negative transcranial Doppler Bubble study with no evidence of right to left intracardiac communication. A vascular evaluation was performed. The right middle cerebral artery was studied. An IV was inserted into the patient's left antecubital. Verbal informed consent was obtained. Negative TCD Bubble study  Cardiac MRI heart: 12/11/2020   Normal EF, unremarkable  Migraine : Currently on Lamictal and also prn Ubrelvy, she states episodes of headaches were about 2 times a week, it went up to 3 times a week after recent [facility] admission for stroke symptoms but now is back to baseline . She misses work about 4 hours q 2 weeks due to migraine headaches. She states described as a band around her head and associated with phonophobia and photophobia   Orthostatic hypotension with tachycardia:   and had  TEE for evaluation of TIA's and strokes to rule out foramen ovale - so far no structural heart disease found,  she is still taking  digoxin and midodrine for BP TID. She it. She continues to have episodes of blanking out , it happened once this week .   Long haul COVID-19: diagnosed 02/04/2020, she continues to have fatigue and SOB with activity . She already had some brain fogginess before COVID-19 but feels worse since COVID , she also has intermittent chest tightness COVID-19 , she had a second episode of COVID-05 Feb 2021. She  feels like myalgia and fatigues are still present. Fatigue worse since she was off work for possible CVA in July and just went back to work    GAD/MDD : Husband died suddenly of a brain aneurysm back in  October 23 rd, 2016.  She has been taking her antidepressant medication and Alprazolam as prescribed.  She remarried her high school sweet heart 06/07/2016 ( and he moved here from [place])  Had a stroke 12/2016. We adjusted Paxil  dose from 40 mg to 60 mg Fall of 2020 and symptoms were controlled, we switched sustained release 37.5  but she noticed it was not working as well  so she added 20 mg immediate release. Insurance denied paying for two separate formulations and is currently taking two  25 CR doses but feels like it is not enough. Currently on 50 mg and symptoms are stable.   She has more meetings and feels overwhelmed by being around a lot of people She is still avoiding crowed placed, work is excusing her from larger meetings (more than 6 people) . She continues to have panic attacks weekly    Hypertriglyceridemia: currently taking Vascepa 3 daily and Crestor, LDL during hospital stay was up to 86, HDL 42    Pre-diabetes : glucose :fasting a little high and last A1C down to 5.8 %   She denies polyphagia, polydipsia or polyuria.  Insomnia: taking Ambien and is taking her less than 30 minutes to fall asleep .She is aware of long term risk of Ambien, but states cannot sleep without it. She also knows that the dose she is taking is not FDA approved for females. She states medication is working now, when tired enough able to fall and stay asleep. We tried going down on flexeril because of compounding sedative effect, but neck pain and headaches got worse, she was unable to go down on Flexeril but lower dose did not work for neck spasms.  She asked for refills     Neck spasms: she takes Flexeril in the afternoon to improve her neck spasms, she also has some other muscle aches intermittent but neck is the worst .   Thyroid nodule:  also has low T3 but normal TSH - unchanged   IMPRESSION: 12/2019  1. Mildly heterogeneous but otherwise unremarkable thyroid gland without evidence of nodule. 2. The palpable abnormality corresponds with a small 0.6 cm lymph node. Favor reactive lymphadenopathy. Recommend continued clinical surveillance. If there is evidence of growth over time, repeat imaging and potentially biopsy may become warranted.  GERD:she had an esophogram, barium swallow done on 05/19/2019 that showed mild GERD, she is on pantoprazole, in am's , symptoms  controlled with medication. She tried to stop but symptoms returns in the form of epigastirc pain but today she is willing to go down on the dose  OSA: wearing CPAP every night now, continue the good work . She has been compliant    Reactive Airway Dysfunction syndrome: she saw Jana Half, pulmonologist, advised to stop Symbicort , take prn albuterol and is now on Breo . She had COVID Dec 2021 and again Dec 2022 - she has noticed increase in difficulty catching her breath lately daily , she has a dry cough intermittently but no wheezing   Leukocytoclastic vasculitis: based on skin biopsy done by Dr. Gwen Pounds, multiple labs done and negative, she has seen Dr. Renard Matter since and had multiple labs that was negative for auto immune disorder was found, she advised follow up if new TIA/CVA  events. Advised her to go back to see Rheumatologist , but she states a lot of labs done during admission and wants to hold off for now    Patient Active Problem List   Diagnosis Date Noted   Essential hypertension 08/30/2021   Acute CVA (cerebrovascular accident) (HCC) 08/29/2021   Cerebrovascular accident (CVA) (HCC)    COVID-19 long hauler manifesting chronic fatigue 03/21/2020   Orthostatic hypotension 02/23/2020   Atypical chest pain 02/23/2020   Pneumonia due to COVID-19 virus 02/07/2020   Reactive airways dysfunction syndrome (HCC) 10/15/2019   Hyperfunction of pituitary gland, unspecified (HCC) 10/15/2019   Calculus of gallbladder without cholecystitis without obstruction 05/06/2019   Cyst of pancreas 05/06/2019   Right kidney stone 05/06/2019   Leukocytoclastic vasculitis (HCC) 10/07/2018   Chronic insomnia 02/09/2018   History of anemia 02/09/2018   Sleep apnea 09/24/2017   History of ischemic stroke 05/30/2017   Hyperlipidemia 05/30/2017   Migraine without aura and without status migrainosus, not intractable 05/27/2017   Pain in right knee 01/21/2017   Psychogenic nonepileptic seizure     Chronic pain syndrome    Restless leg syndrome    Chronic prescription benzodiazepine use 01/20/2016   Leukocytosis 01/20/2016   Seizure-like activity (HCC)    GAD (generalized anxiety disorder) 02/14/2015   Chronic neck pain 02/14/2015   History of hysterectomy 02/14/2015   Iron deficiency anemia due to chronic blood loss 08/29/2014   Insomnia, persistent 08/14/2014   Major depression (HCC) 08/14/2014   Temporomandibular joint sounds on opening and/or closing the jaw 08/14/2014   Degenerative disc disease, lumbar 08/14/2014   Bleeding internal hemorrhoids 08/14/2014   Gastric reflux 08/14/2014   Irritable bowel syndrome with constipation 08/14/2014   Hypertriglyceridemia 08/14/2014   Overweight 08/14/2014   Tinnitus 08/14/2014   Vitamin D deficiency 08/14/2014   Sinus tachycardia 11/25/2012   DOE (dyspnea on exertion) 11/06/2012    Past Surgical History:  Procedure Laterality Date   ABDOMINOPLASTY  Feb. 2015   CESAREAN SECTION  2011   CHOLECYSTECTOMY N/A 05/31/2019   Procedure: LAPAROSCOPIC CHOLECYSTECTOMY WITH INTRAOPERATIVE CHOLANGIOGRAM;  Surgeon: Earline MayotteByrnett, Jeffrey W, MD;  Location: [facility] ORS;  Service: General;  Laterality: N/A;   COLONOSCOPY WITH PROPOFOL N/A 11/26/2016   Procedure: COLONOSCOPY WITH PROPOFOL;  Surgeon: Wyline MoodAnna, Kiran, MD;  [facility];  Service: Gastroenterology;  Laterality: N/A;   COLONOSCOPY WITH PROPOFOL N/A 08/08/2020   Procedure: COLONOSCOPY WITH PROPOFOL;  Surgeon: Regis BillLocklear, Cameron T, MD;  Location: [facility] ENDOSCOPY;  Service: Endoscopy;  Laterality: N/A;   CYSTOSCOPY  02/02/2015   Procedure: CYSTOSCOPY;  Surgeon: Nadara Mustardobert P Harris, MD;  Location: [facility] ORS;  Service: Gynecology;;   DILATION AND CURETTAGE OF UTERUS  2003, 2005, 2008   ESOPHAGOGASTRODUODENOSCOPY N/A 05/31/2019   Procedure: ESOPHAGOGASTRODUODENOSCOPY (EGD);  Surgeon: Earline MayotteByrnett, Jeffrey W, MD;  Location: [facility] OR[facility];  Service: General;  Laterality: N/A;  in the O.R.    ESOPHAGOGASTRODUODENOSCOPY (EGD) WITH PROPOFOL N/A 11/26/2016   Procedure: ESOPHAGOGASTRODUODENOSCOPY (EGD) WITH PROPOFOL;  Surgeon: Wyline MoodAnna, Kiran, MD;  [facility];  Service: Gastroenterology;  Laterality: N/A;   HERNIA REPAIR  1999   Umbilical   KNEE ARTHROSCOPY Right 2006   LAPAROSCOPIC BILATERAL SALPINGECTOMY Bilateral 02/02/2015   Procedure: LAPAROSCOPIC BILATERAL SALPINGECTOMY;  Surgeon: Nadara Mustardobert P Harris, MD;  Location: [facility] OR[facility];  Service: Gynecology;  Laterality: Bilateral;   LAPAROSCOPIC HYSTERECTOMY N/A 02/02/2015   Procedure: HYSTERECTOMY TOTAL LAPAROSCOPIC;  Surgeon: Nadara Mustardobert P Harris, MD;  Location: [facility];  Service: Gynecology;  Laterality: N/A;   LOOP  RECORDER INSERTION N/A 12/20/2016   Procedure: LOOP RECORDER INSERTION;  Surgeon: Regan Lemming, MD;  [facility];  Service: Cardiovascular;  Laterality: N/A;   REDUCTION MAMMAPLASTY Bilateral December 2015   TEE WITHOUT CARDIOVERSION N/A 12/20/2016   Procedure: TRANSESOPHAGEAL ECHOCARDIOGRAM (TEE);  Surgeon: Lars Masson, MD;  Location: [facility] ENDOSCOPY;  Service: Cardiovascular;  Laterality: N/A;   TEE WITHOUT CARDIOVERSION N/A 03/29/2020   Procedure: TRANSESOPHAGEAL ECHOCARDIOGRAM (TEE);  Surgeon: Debbe Odea, MD;  Location: [facility] ORS;  Service: Cardiovascular;  Laterality: N/A;    Family History  Problem Relation Age of Onset   Hypertension Mother    Hyperlipidemia Mother    Heart Problems Father        hole in heart and lower ventricles reversed   Prostate cancer Maternal Grandfather    Von Willebrand disease Maternal Uncle     Social History   Tobacco Use   Smoking status: Never   Smokeless tobacco: Never  Substance Use Topics   Alcohol use: No    Alcohol/week: 0.0 standard drinks of alcohol     Current Outpatient Medications:    clopidogrel (PLAVIX) 75 MG tablet, Take 75 mg by mouth daily., Disp: , Rfl:    cyclobenzaprine (FLEXERIL) 10 MG tablet, TAKE 1 TABLET(10 MG) BY  MOUTH AT BEDTIME, Disp: 90 tablet, Rfl: 1   Dapsone (ACZONE) 7.5 % GEL, Apply 1 application. topically as directed. Qd to bid to aa face, Disp: 60 g, Rfl: 11   digoxin (LANOXIN) 0.125 MG tablet, Take 1 tablet (0.125 mg total) by mouth daily., Disp: 30 tablet, Rfl: 5   fluticasone furoate-vilanterol (BREO ELLIPTA) 100-25 MCG/ACT AEPB, Inhale 1 puff into the lungs daily., Disp: 1 each, Rfl: 2   hydrocortisone 2.5 % cream, Apply topically 3 (three) times a week. Apply to scaly areas face 3 times weekly Tuesday, Thursday, Saturday, Disp: 30 g, Rfl: 3   lamoTRIgine (LAMICTAL) 100 MG tablet, Take 100 mg by mouth 2 (two) times daily., Disp: , Rfl:    midodrine (PROAMATINE) 5 MG tablet, Take 1 tablet (5 mg) by mouth three times a day at 7 am, 11 am, & 3 pm, Disp: 90 tablet, Rfl: 5   PARoxetine (PAXIL-CR) 25 MG 24 hr tablet, TAKE 2 TABLETS(50 MG) BY MOUTH DAILY, Disp: 180 tablet, Rfl: 1   pimecrolimus (ELIDEL) 1 % cream, Apply topically 2 (two) times daily., Disp: 30 g, Rfl: 0   rosuvastatin (CRESTOR) 40 MG tablet, Take 1 tablet (40 mg total) by mouth daily. In place of Atorvastatin, Disp: 90 tablet, Rfl: 1   sodium chloride 1 g tablet, Take 1 tablet (1 g total) by mouth 3 (three) times daily., Disp: 90 tablet, Rfl: 5   UBRELVY 100 MG TABS, Take 100 mg by mouth daily as needed (migraine). , Disp: , Rfl:    Vitamin D, Ergocalciferol, (DRISDOL) 1.25 MG (50000 UNIT) CAPS capsule, TAKE 1 CAPSULE BY MOUTH EVERY 7 DAYS, Disp: 12 capsule, Rfl: 1   ALPRAZolam (XANAX XR) 1 MG 24 hr tablet, Take 1 tablet (1 mg total) by mouth every morning., Disp: 90 tablet, Rfl: 0   pantoprazole (PROTONIX) 20 MG tablet, Take 1 tablet (20 mg total) by mouth every morning., Disp: 90 tablet, Rfl: 1   zolpidem (AMBIEN CR) 12.5 MG CR tablet, Take 1 tablet (12.5 mg total) by mouth at bedtime as needed for sleep., Disp: 90 tablet, Rfl: 0  Allergies  Allergen Reactions   Azithromycin Diarrhea   Penicillins Hives and Swelling  Has  patient had a PCN reaction causing immediate rash, facial/tongue/throat swelling, SOB or lightheadedness with hypotension: Yes Has patient had a PCN reaction causing severe rash involving mucus membranes or skin necrosis: No Has patient had a PCN reaction that required hospitalization No Has patient had a PCN reaction occurring within the last 10 years: No If all of the above answers are "NO", then may proceed with Cephalosporin use.    I personally reviewed active problem list, medication list, allergies, family history, social history, health maintenance with the patient/caregiver today.   ROS  Ten systems reviewed and is negative except as mentioned in HPI   Objective  Vitals:   09/13/21 0943  BP: 116/72  Pulse: 99  Resp: 16  Temp: 97.6 F (36.4 C)  TempSrc: Oral  SpO2: 97%  Weight: 195 lb 6.4 oz (88.6 kg)  Height: 5\' 8"  (1.727 m)    Body mass index is 29.71 kg/m.  Physical Exam  Constitutional: Patient appears well-developed and well-nourished. Overweight.  No distress.  HEENT: head atraumatic, normocephalic, pupils equal and reactive to light, neck supple Cardiovascular: Normal rate, regular rhythm and normal heart sounds.  No murmur heard. No BLE edema. Pulmonary/Chest: Effort normal and breath sounds normal. No respiratory distress. Abdominal: Soft.  There is no tenderness. Neurological: strength 5/5 but weaker on left grip, dysarthria  Psychiatric: Patient has a normal mood and affect. behavior is normal. Judgment and thought content normal.    PHQ2/9:    09/13/2021    9:43 AM 09/06/2021    8:17 AM 05/22/2021    8:19 AM 03/15/2021    7:41 AM 02/15/2021   11:00 AM  Depression screen PHQ 2/9  Decreased Interest 1 1 0 0 2  Down, Depressed, Hopeless 1 1 0 0 2  PHQ - 2 Score 2 2 0 0 4  Altered sleeping 1 1 0 0 0  Tired, decreased energy 1 1 0 0 1  Change in appetite 3 3 0 2 0  Feeling bad or failure about yourself  0 1 0 0 0  Trouble concentrating 0 1 0 0 0   Moving slowly or fidgety/restless 0 0 0 0 0  Suicidal thoughts 1 0 0 0 0  PHQ-9 Score 8 9 0 2 5  Difficult doing work/chores Somewhat difficult Very difficult   Not difficult at all    phq 9 is positive   Fall Risk:    09/13/2021    9:42 AM 09/06/2021    8:17 AM 05/22/2021    8:19 AM 03/15/2021    7:35 AM 02/15/2021   11:00 AM  Fall Risk   Falls in the past year? 1 1 1 1 1   Number falls in past yr: 1 1 1  0 1  Injury with Fall? 1 1 1  0 1  Risk for fall due to : Impaired balance/gait;History of fall(s) History of fall(s);Impaired balance/gait No Fall Risks No Fall Risks   Follow up Education provided;Falls prevention discussed Falls evaluation completed Falls prevention discussed Falls prevention discussed       Functional Status Survey: Is the patient deaf or have difficulty hearing?: No Does the patient have difficulty seeing, even when wearing glasses/contacts?: No Does the patient have difficulty concentrating, remembering, or making decisions?: Yes Does the patient have difficulty walking or climbing stairs?: Yes Does the patient have difficulty dressing or bathing?: Yes Does the patient have difficulty doing errands alone such as visiting a doctor's office or shopping?: No    Assessment &  Plan  1. Recurrent strokes (HCC)  Last admission 08/2021  2. Hemiparesis of left nondominant side as late effect of cerebral infarction (HCC)  Stable   3. Migraine without aura and without status migrainosus, not intractable  Follow up with neurologist   4. Leukocytoclastic vasculitis (HCC)  She wants to hold off on referral to Rheumatologist at this time  5. Chronic insomnia  - zolpidem (AMBIEN CR) 12.5 MG CR tablet; Take 1 tablet (12.5 mg total) by mouth at bedtime as needed for sleep.  Dispense: 90 tablet; Refill: 0  6. Gastric reflux  - pantoprazole (PROTONIX) 20 MG tablet; Take 1 tablet (20 mg total) by mouth every morning.  Dispense: 90 tablet; Refill: 1  7. GAD  (generalized anxiety disorder)  - ALPRAZolam (XANAX XR) 1 MG 24 hr tablet; Take 1 tablet (1 mg total) by mouth every morning.  Dispense: 90 tablet; Refill: 0  8. OSA (obstructive sleep apnea)   9. Reactive airways dysfunction syndrome (HCC)  Having some increase in SOB , advised to resume Breo daily, she has been using prn only lately  - fluticasone furoate-vilanterol (BREO ELLIPTA) 100-25 MCG/ACT AEPB; Inhale 1 puff into the lungs daily.  Dispense: 1 each; Refill: 2

## 2021-09-13 ENCOUNTER — Ambulatory Visit: Payer: BC Managed Care – PPO | Admitting: Internal Medicine

## 2021-09-13 ENCOUNTER — Ambulatory Visit: Payer: BC Managed Care – PPO | Admitting: Family Medicine

## 2021-09-13 ENCOUNTER — Encounter: Payer: Self-pay | Admitting: Family Medicine

## 2021-09-13 ENCOUNTER — Encounter: Payer: Self-pay | Admitting: Internal Medicine

## 2021-09-13 ENCOUNTER — Other Ambulatory Visit: Payer: Self-pay | Admitting: Family Medicine

## 2021-09-13 VITALS — BP 108/88 | HR 124 | Ht 68.0 in | Wt 190.4 lb

## 2021-09-13 VITALS — BP 116/72 | HR 99 | Temp 97.6°F | Resp 16 | Ht 68.0 in | Wt 195.4 lb

## 2021-09-13 DIAGNOSIS — M310 Hypersensitivity angiitis: Secondary | ICD-10-CM

## 2021-09-13 DIAGNOSIS — G4733 Obstructive sleep apnea (adult) (pediatric): Secondary | ICD-10-CM

## 2021-09-13 DIAGNOSIS — I493 Ventricular premature depolarization: Secondary | ICD-10-CM | POA: Diagnosis not present

## 2021-09-13 DIAGNOSIS — F411 Generalized anxiety disorder: Secondary | ICD-10-CM

## 2021-09-13 DIAGNOSIS — G43009 Migraine without aura, not intractable, without status migrainosus: Secondary | ICD-10-CM | POA: Diagnosis not present

## 2021-09-13 DIAGNOSIS — I639 Cerebral infarction, unspecified: Secondary | ICD-10-CM

## 2021-09-13 DIAGNOSIS — G901 Familial dysautonomia [Riley-Day]: Secondary | ICD-10-CM

## 2021-09-13 DIAGNOSIS — K219 Gastro-esophageal reflux disease without esophagitis: Secondary | ICD-10-CM

## 2021-09-13 DIAGNOSIS — I10 Essential (primary) hypertension: Secondary | ICD-10-CM | POA: Diagnosis not present

## 2021-09-13 DIAGNOSIS — J683 Other acute and subacute respiratory conditions due to chemicals, gases, fumes and vapors: Secondary | ICD-10-CM

## 2021-09-13 DIAGNOSIS — F5104 Psychophysiologic insomnia: Secondary | ICD-10-CM

## 2021-09-13 DIAGNOSIS — I69354 Hemiplegia and hemiparesis following cerebral infarction affecting left non-dominant side: Secondary | ICD-10-CM | POA: Diagnosis not present

## 2021-09-13 DIAGNOSIS — F330 Major depressive disorder, recurrent, mild: Secondary | ICD-10-CM

## 2021-09-13 MED ORDER — PANTOPRAZOLE SODIUM 20 MG PO TBEC: 20.0000 mg | DELAYED_RELEASE_TABLET | ORAL | 1 refills | Status: DC

## 2021-09-13 MED ORDER — FLUTICASONE FUROATE-VILANTEROL 100-25 MCG/ACT IN AEPB: 1.0000 | INHALATION_SPRAY | Freq: Every day | RESPIRATORY_TRACT | 2 refills | Status: DC

## 2021-09-13 MED ORDER — ALPRAZOLAM ER 1 MG PO TB24: 1.0000 mg | ORAL_TABLET | ORAL | 0 refills | Status: DC

## 2021-09-13 MED ORDER — ZOLPIDEM TARTRATE ER 12.5 MG PO TBCR: 12.5000 mg | EXTENDED_RELEASE_TABLET | Freq: Every evening | ORAL | 0 refills | Status: DC | PRN

## 2021-09-13 NOTE — Patient Instructions (Signed)
Medication Instructions:  - Your physician recommends that you continue on your current medications as directed. Please refer to the Current Medication list given to you today.  *If you need a refill on your cardiac medications before your next appointment, please call your pharmacy*   Lab Work: - none ordered  If you have labs (blood work) drawn today and your tests are completely normal, you will receive your results only by: MyChart Message (if you have MyChart) OR A paper copy in the mail If you have any lab test that is abnormal or we need to change your treatment, we will call you to review the results.   Testing/Procedures: - none ordered   Follow-Up: At [facility], you and your health needs are our priority.  As part of our continuing mission to provide you with exceptional heart care, we have created designated Provider Care Teams.  These Care Teams include your primary Cardiologist (physician) and Advanced Practice Providers (APPs -  Physician Assistants and Nurse Practitioners) who all work together to provide you with the care you need, when you need it.  We recommend signing up for the patient portal called "MyChart".  Sign up information is provided on this After Visit Summary.  MyChart is used to connect with patients for Virtual Visits (Telemedicine).  Patients are able to view lab/test results, encounter notes, upcoming appointments, etc.  Non-urgent messages can be sent to your provider as well.   To learn more about what you can do with MyChart, go to [email or web address].    Your next appointment:   8-10 week(s)  The format for your next appointment:   In Person  Provider:   Sherryl Manges, MD    Other Instructions N/a  Important Information About Sugar

## 2021-09-26 ENCOUNTER — Other Ambulatory Visit: Payer: Self-pay

## 2021-09-26 ENCOUNTER — Other Ambulatory Visit: Payer: Self-pay | Admitting: *Deleted

## 2021-09-26 MED ORDER — DIGOXIN 125 MCG PO TABS: 0.1250 mg | ORAL_TABLET | Freq: Every day | ORAL | 0 refills | Status: DC

## 2021-09-26 MED ORDER — DIGOXIN 125 MCG PO TABS: 0.1250 mg | ORAL_TABLET | Freq: Every day | ORAL | 1 refills | Status: DC

## 2021-09-27 ENCOUNTER — Telehealth: Payer: BC Managed Care – PPO | Admitting: Internal Medicine

## 2021-10-05 ENCOUNTER — Encounter: Payer: Self-pay | Admitting: Family Medicine

## 2021-10-05 ENCOUNTER — Telehealth: Payer: Self-pay | Admitting: Family Medicine

## 2021-10-05 NOTE — Telephone Encounter (Signed)
Patient called and asked how is she taking the Alprazolam to be out already, noted on the Rx to fill August 22nd. She says she is taking it once daily as prescribed, May and July had 31 days in it, which had her taking 2 extra pills and should be refilled on 10/07/20. I advised I will send this to Dr. Carlynn Purl and she will have to approve the refill, patient verbalized understanding.

## 2021-10-05 NOTE — Telephone Encounter (Signed)
Pt reports that she is completely out of her medication and she was told by the pharmacy that it can not be filled until 10/09/21.   Medication Refill - Medication: ALPRAZolam (XANAX XR) 1 MG 24 hr tablet  Has the patient contacted their pharmacy? Yes.    Preferred Pharmacy (with phone number or street name):  [id number] - [place], [place] - [address] Phone:  [phone]  Fax:  [phone]     Has the patient been seen for an appointment in the last year OR does the patient have an upcoming appointment? Yes.    Agent: Please be advised that RX refills may take up to 3 business days. We ask that you follow-up with your pharmacy.

## 2021-10-06 ENCOUNTER — Encounter: Payer: Self-pay | Admitting: Internal Medicine

## 2021-10-07 ENCOUNTER — Other Ambulatory Visit: Payer: Self-pay | Admitting: Family Medicine

## 2021-10-07 DIAGNOSIS — F411 Generalized anxiety disorder: Secondary | ICD-10-CM

## 2021-10-07 MED ORDER — ALPRAZOLAM ER 1 MG PO TB24: 1.0000 mg | ORAL_TABLET | ORAL | 0 refills | Status: DC

## 2021-10-08 ENCOUNTER — Other Ambulatory Visit: Payer: Self-pay | Admitting: Family Medicine

## 2021-10-08 DIAGNOSIS — F411 Generalized anxiety disorder: Secondary | ICD-10-CM

## 2021-10-08 MED ORDER — ALPRAZOLAM ER 1 MG PO TB24: 1.0000 mg | ORAL_TABLET | ORAL | 0 refills | Status: DC

## 2021-10-11 ENCOUNTER — Ambulatory Visit: Payer: BC Managed Care – PPO | Admitting: Cardiology

## 2021-10-31 ENCOUNTER — Other Ambulatory Visit: Payer: Self-pay

## 2021-10-31 DIAGNOSIS — F330 Major depressive disorder, recurrent, mild: Secondary | ICD-10-CM

## 2021-10-31 DIAGNOSIS — F411 Generalized anxiety disorder: Secondary | ICD-10-CM

## 2021-10-31 MED ORDER — PAROXETINE HCL ER 25 MG PO TB24: ORAL_TABLET | ORAL | 1 refills | Status: DC

## 2021-11-02 DIAGNOSIS — R200 Anesthesia of skin: Secondary | ICD-10-CM | POA: Diagnosis not present

## 2021-11-02 DIAGNOSIS — Z87898 Personal history of other specified conditions: Secondary | ICD-10-CM | POA: Diagnosis not present

## 2021-11-02 DIAGNOSIS — R202 Paresthesia of skin: Secondary | ICD-10-CM | POA: Diagnosis not present

## 2021-11-02 DIAGNOSIS — I63442 Cerebral infarction due to embolism of left cerebellar artery: Secondary | ICD-10-CM | POA: Diagnosis not present

## 2021-11-06 ENCOUNTER — Telehealth: Payer: Self-pay | Admitting: Physician Assistant

## 2021-11-06 ENCOUNTER — Encounter: Payer: Self-pay | Admitting: Physician Assistant

## 2021-11-06 ENCOUNTER — Ambulatory Visit (INDEPENDENT_AMBULATORY_CARE_PROVIDER_SITE_OTHER): Payer: Self-pay | Admitting: Physician Assistant

## 2021-11-06 VITALS — BP 140/72 | HR 100 | Temp 98.2°F | Wt 190.4 lb

## 2021-11-06 DIAGNOSIS — F419 Anxiety disorder, unspecified: Secondary | ICD-10-CM

## 2021-11-06 DIAGNOSIS — R1310 Dysphagia, unspecified: Secondary | ICD-10-CM

## 2021-11-06 NOTE — Telephone Encounter (Signed)
Left a message for patient on voicemail regarding her appointment earlier.

## 2021-11-06 NOTE — Progress Notes (Signed)
[facility]rapist, [facility] [address] [place], [place] [address]   Office Visit Note  Patient Name: Alicia [name] Note Alicia [name] Date of Birth [id number]  Medical Record number [id number]  Date of Service: 11/06/2021  Chief Complaint  Patient presents with   throat    Pt stated she took her Sodium pill this morning around 8:15 and it got stuck. She made herself throw up. Now her throat is burning.     51 y/o F presents to the clinic for c/o a "salt pill" getting stuck in her throat earlier today. She wasn't able to breathe for few seconds and had to vomit and to force the pill to come out. Currently patient is crying and shaking. She is able to breathe now and speak softly.       Current Medication:  Outpatient Encounter Medications as of 11/06/2021  Medication Sig   ALPRAZolam (XANAX XR) 1 MG 24 hr tablet Take 1 tablet (1 mg total) by mouth every morning.   clopidogrel (PLAVIX) 75 MG tablet Take 75 mg by mouth daily.   cyclobenzaprine (FLEXERIL) 10 MG tablet TAKE 1 TABLET(10 MG) BY MOUTH AT BEDTIME   Dapsone (ACZONE) 7.5 % GEL Apply 1 application. topically as directed. Qd to bid to aa face   digoxin (LANOXIN) 0.125 MG tablet Take 1 tablet (0.125 mg total) by mouth daily.   fluticasone furoate-vilanterol (BREO ELLIPTA) 100-25 MCG/ACT AEPB Inhale 1 puff into the lungs daily.   lamoTRIgine (LAMICTAL) 100 MG tablet Take 100 mg by mouth 2 (two) times daily.   midodrine (PROAMATINE) 5 MG tablet Take 1 tablet (5 mg) by mouth three times a day at 7 am, 11 am, & 3 pm   PARoxetine (PAXIL-CR) 25 MG 24 hr tablet TAKE 2 TABLET BY MOUTH DAILY   rosuvastatin (CRESTOR) 40 MG tablet Take 1 tablet (40 mg total) by mouth daily. In place of Atorvastatin   sodium chloride 1 g tablet Take 1 tablet (1 g total) by mouth 3 (three) times daily.   UBRELVY 100 MG TABS Take 100 mg by mouth daily as needed (migraine).    Vitamin D, Ergocalciferol, (DRISDOL) 1.25 MG (50000 UNIT) CAPS capsule TAKE 1 CAPSULE BY MOUTH  EVERY 7 DAYS   zolpidem (AMBIEN CR) 12.5 MG CR tablet Take 1 tablet (12.5 mg total) by mouth at bedtime as needed for sleep.   hydrocortisone 2.5 % cream Apply topically 3 (three) times a week. Apply to scaly areas face 3 times weekly Tuesday, Thursday, Saturday (Patient not taking: Reported on 11/06/2021)   pantoprazole (PROTONIX) 20 MG tablet Take 1 tablet (20 mg total) by mouth every morning. (Patient not taking: Reported on 11/06/2021)   pimecrolimus (ELIDEL) 1 % cream Apply topically 2 (two) times daily. (Patient not taking: Reported on 11/06/2021)   No facility-administered encounter medications on file as of 11/06/2021.      Medical History: Past Medical History:  Diagnosis Date   Anemia    Anxiety    Depression    Dyspnea    SINCE STROKE   GERD (gastroesophageal reflux disease)    Hyperlipidemia    Hypertension    IBS (irritable bowel syndrome)    Migraine    Previous cesarean delivery, delivered, with or without mention of antepartum condition 01/19/2012   Psychogenic nonepileptic seizure    hx/notes 12/19/2016-LAST SEIZURE IN 2019-ON NO MEDS AS OF 05-24-19   Restless leg syndrome    Sleep apnea    USES CPAP   Stroke (HCC) 12/18/2016  Acute arterial ischemic stroke, multifocal, posterior circulation /notes 12/19/2016-LEFT SIDED WEAKNESS, MEMORY FATIGUE AND TROUBLE FINDING HER WORDS OCC     Vital Signs: BP (!) 140/72 (BP Location: Left Arm, Patient Position: Sitting, Cuff Size: Normal)   Pulse 100   Temp 98.2 F (36.8 C) (Tympanic)   Wt 190 lb 6.4 oz (86.4 kg)   LMP 01/15/2015   SpO2 94%   BMI 28.95 kg/m    Review of Systems  Constitutional: Negative.   HENT:  Positive for sore throat (throat pain), trouble swallowing and voice change.   Respiratory: Negative.    Cardiovascular: Negative.   Gastrointestinal: Negative.   Psychiatric/Behavioral:  The patient is nervous/anxious.     Physical Exam Constitutional:      Appearance: Normal appearance.  HENT:      Head: Atraumatic.     Right Ear: Tympanic membrane, ear canal and external ear normal.     Left Ear: Tympanic membrane, ear canal and external ear normal.     Nose: Nose normal.     Mouth/Throat:     Mouth: Mucous membranes are moist.     Pharynx: Oropharynx is clear. Uvula midline.     Comments: Mild erythema of the uvula.  Eyes:     Extraocular Movements: Extraocular movements intact.  Cardiovascular:     Rate and Rhythm: Normal rate and regular rhythm.  Pulmonary:     Effort: Pulmonary effort is normal.     Breath sounds: Normal breath sounds.  Musculoskeletal:     Cervical back: Neck supple.  Lymphadenopathy:     Head:     Right side of head: No submandibular or tonsillar adenopathy.     Left side of head: No submandibular or tonsillar adenopathy.     Cervical: No cervical adenopathy.  Skin:    General: Skin is warm.  Neurological:     Mental Status: She is alert.  Psychiatric:        Mood and Affect: Mood is anxious. Affect is tearful.        Behavior: Behavior normal.        Thought Content: Thought content normal.        Judgment: Judgment normal.     Comments: Crying, but consoleable       Assessment/Plan:   ICD-10-CM   1. Dysphagia, unspecified type  R13.10     2. Acute anxiety  F41.9       Discussed my clinical findings with patient. Slight erythematous uvula, possible from the trauma of forcing the pill to come back out. Pt was given Benadryl 25mg  tablet while at the clinic along with water and was able to swallow.  Recommended to get otc throat spray as well as Children's liquid Ibuprofen to help with pain.  Pt verbalized understanding and in agreement.  May consider a referral to ENT if symptoms persist.  Today's visit is a 30 minute F2F encounter.   General Counseling: Alicia [name] understanding of the findings of todays visit and agrees with plan of treatment. I have discussed any further diagnostic evaluation that may be needed or ordered  today. We also reviewed her medications today. she has been encouraged to call the office with any questions or concerns that should arise related to todays visit.    Time spent:30 Alicia [name], Alicia [name] Physician Assistant

## 2021-11-07 ENCOUNTER — Other Ambulatory Visit: Payer: Self-pay | Admitting: Family Medicine

## 2021-11-07 DIAGNOSIS — G8929 Other chronic pain: Secondary | ICD-10-CM

## 2021-11-09 ENCOUNTER — Encounter: Payer: Self-pay | Admitting: Medical

## 2021-11-09 ENCOUNTER — Ambulatory Visit: Payer: BC Managed Care – PPO | Attending: Internal Medicine | Admitting: Medical

## 2021-11-09 VITALS — BP 130/86 | HR 88 | Ht 69.0 in | Wt 192.8 lb

## 2021-11-09 DIAGNOSIS — I10 Essential (primary) hypertension: Secondary | ICD-10-CM

## 2021-11-09 DIAGNOSIS — I493 Ventricular premature depolarization: Secondary | ICD-10-CM | POA: Diagnosis not present

## 2021-11-09 DIAGNOSIS — G44201 Tension-type headache, unspecified, intractable: Secondary | ICD-10-CM | POA: Diagnosis not present

## 2021-11-09 DIAGNOSIS — R5383 Other fatigue: Secondary | ICD-10-CM

## 2021-11-09 DIAGNOSIS — I639 Cerebral infarction, unspecified: Secondary | ICD-10-CM

## 2021-11-09 DIAGNOSIS — G901 Familial dysautonomia [Riley-Day]: Secondary | ICD-10-CM

## 2021-11-09 DIAGNOSIS — Z8673 Personal history of transient ischemic attack (TIA), and cerebral infarction without residual deficits: Secondary | ICD-10-CM

## 2021-11-09 NOTE — Patient Instructions (Signed)
Medication Instructions:   Your physician recommends that you continue on your current medications as directed. Please refer to the Current Medication list given to you today.  *If you need a refill on your cardiac medications before your next appointment, please call your pharmacy*   Follow-Up: At [facility], you and your health needs are our priority.  As part of our continuing mission to provide you with exceptional heart care, we have created designated Provider Care Teams.  These Care Teams include your primary Cardiologist (physician) and Advanced Practice Providers (APPs -  Physician Assistants and Nurse Practitioners) who all work together to provide you with the care you need, when you need it.  We recommend signing up for the patient portal called "MyChart".  Sign up information is provided on this After Visit Summary.  MyChart is used to connect with patients for Virtual Visits (Telemedicine).  Patients are able to view lab/test results, encounter notes, upcoming appointments, etc.  Non-urgent messages can be sent to your provider as well.   To learn more about what you can do with MyChart, go to [email or web address].    Your next appointment:     3 Months  The format for your next appointment:    In Person  Provider:    Dr. Caryl Comes     Other Instructions  Important Information About Sugar

## 2021-11-09 NOTE — Progress Notes (Unsigned)
Cardiology Office Note:    Date:  11/10/2021   ID:  Alicia [name] Note Alicia [name], Alicia [name] 1970/09/06, MRN [id number]  PCP:  Steele Sizer, MD  [facility] Cardiologist:  Kate Sable, MD  [facility] Electrophysiologist:  None   Referring MD: Steele Sizer, MD   Chief Complaint: fatigue and headaches  History of Present Illness:    Alicia [name] is a 51 y.o. female with a hx of cryptogenic stroke in 2018, s/p ILR and subsequent recurrent strokes (most recent July 2023), migraines, COVID-19 01/2020 with laung-haul fatigue and DOE, inappropriate sinus tachycardia, dysautonomia, orthostatic lightheadedness, HLD, anxiety who presents for follow-up.   She had a cryptogenic stroke in 2018, s/p ILR implantation, hypercoagulable work-up was negative. TEE showed possible interatrial septum. Bubble study was negative.  Antoerh CVA in 2019 with left sided weakness. ILR did not show any afib or flutter.  She has COVID PNA in 01/2020.  She had recurrent stroke 02/2020 with negative work-up. Echo 02/2020 showed LVEF 60-65%, impaired relaxation.  She saw Neurology in 02/2021 and had repeat MRI. She was advised to stop ASA and continue Plavix. CMRI Was negative for cardioembolic source.  Most recent stroke was 08/2021. CTA, MRI and CT were all negative, except for old lacunar infarct.  She has residual left sided weakness and has issues finding words.   Follows with Dr. Caryl Comes for inappropriate sinus tachycardia and dysautonomia.   Also with recurrent syncope. Heart monitor 02/2021 showed less than 1% PVCs. Heart monitor 07/2021 showed HR 66bpm, max HR 154 and avg HR 95bpm, PVCs 7.8%.   H/o orthostatic lightheadedness, improved with midodrine. She takes 5mg  TID. She also takes salt tablets TID and stays hydrated. She does wear compression at times.   Today, the patient reports she has been feeling more fatigue and has been waking up with a headache. COVID testing was negative. She denies fever  or chills. She has chest tightness within 2 hours of waking. She works at [employer] in [employer]. She has gone from 3 days a week to 5 days a week, this change happened 3 weeks ago. She used to work from home those 2 days. She is not sleeping well, having trouble falling asleep. She has low appetite since 3 days ago. She is drinking water. She walks some during the day, but very tired after work. She has chronic SOB that is unchanged. She has no lower leg edema. She has OSA and uses her CPAP. She takes midodrine 3 times daily and salt tablets three times daily. Neurology prescribes lamotrigine.  Neurology is going to do MRI head and neck . She did muscle testing as well. She feels a dysautomia exacerbation may be coming on. She plans on going to visit family in a couple weeks.   Past Medical History:  Diagnosis Date   Anemia    Anxiety    Depression    Dyspnea    SINCE STROKE   GERD (gastroesophageal reflux disease)    Hyperlipidemia    Hypertension    IBS (irritable bowel syndrome)    Migraine    Previous cesarean delivery, delivered, with or without mention of antepartum condition 01/19/2012   Psychogenic nonepileptic seizure    hx/notes 12/19/2016-LAST SEIZURE IN 2019-ON NO MEDS AS OF 05-24-19   Restless leg syndrome    Sleep apnea    USES CPAP   Stroke ([place]) 12/18/2016   Acute arterial ischemic stroke, multifocal, posterior circulation /notes 12/19/2016-LEFT SIDED WEAKNESS, MEMORY FATIGUE AND TROUBLE FINDING HER  WORDS [facility]    Past Surgical History:  Procedure Laterality Date   ABDOMINOPLASTY  Feb. 2015   CESAREAN SECTION  2011   CHOLECYSTECTOMY N/A 05/31/2019   Procedure: LAPAROSCOPIC CHOLECYSTECTOMY WITH INTRAOPERATIVE CHOLANGIOGRAM;  Surgeon: Robert Bellow, MD;  Location: [facility] ORS;  Service: General;  Laterality: N/A;   COLONOSCOPY WITH PROPOFOL N/A 11/26/2016   Procedure: COLONOSCOPY WITH PROPOFOL;  Surgeon: Jonathon Bellows, MD;  Location: [facility] ENDOSCOPY;  Service: Gastroenterology;   Laterality: N/A;   COLONOSCOPY WITH PROPOFOL N/A 08/08/2020   Procedure: COLONOSCOPY WITH PROPOFOL;  Surgeon: Lesly Rubenstein, MD;  Location: [facility] ENDOSCOPY;  Service: Endoscopy;  Laterality: N/A;   CYSTOSCOPY  02/02/2015   Procedure: CYSTOSCOPY;  Surgeon: Gae Dry, MD;  Location: [facility] ORS;  Service: Gynecology;;   DILATION AND CURETTAGE OF UTERUS  2003, 2005, 2008   ESOPHAGOGASTRODUODENOSCOPY N/A 05/31/2019   Procedure: ESOPHAGOGASTRODUODENOSCOPY (EGD);  Surgeon: Robert Bellow, MD;  Location: [facility] ORS;  Service: General;  Laterality: N/A;  in the O.R.   ESOPHAGOGASTRODUODENOSCOPY (EGD) WITH PROPOFOL N/A 11/26/2016   Procedure: ESOPHAGOGASTRODUODENOSCOPY (EGD) WITH PROPOFOL;  Surgeon: Jonathon Bellows, MD;  Location: [facility] ENDOSCOPY;  Service: Gastroenterology;  Laterality: N/A;   HERNIA REPAIR  [id number]   Umbilical   KNEE ARTHROSCOPY Right 2006   LAPAROSCOPIC BILATERAL SALPINGECTOMY Bilateral 02/02/2015   Procedure: LAPAROSCOPIC BILATERAL SALPINGECTOMY;  Surgeon: Gae Dry, MD;  Location: [facility] ORS;  Service: Gynecology;  Laterality: Bilateral;   LAPAROSCOPIC HYSTERECTOMY N/A 02/02/2015   Procedure: HYSTERECTOMY TOTAL LAPAROSCOPIC;  Surgeon: Gae Dry, MD;  Location: [facility] ORS;  Service: Gynecology;  Laterality: N/A;   LOOP RECORDER INSERTION N/A 12/20/2016   Procedure: LOOP RECORDER INSERTION;  Surgeon: Constance Haw, MD;  Location: [facility];  Service: Cardiovascular;  Laterality: N/A;   REDUCTION MAMMAPLASTY Bilateral December 2015   TEE WITHOUT CARDIOVERSION N/A 12/20/2016   Procedure: TRANSESOPHAGEAL ECHOCARDIOGRAM (TEE);  Surgeon: Teagen Spark, MD;  Location: [place];  Service: Cardiovascular;  Laterality: N/A;   TEE WITHOUT CARDIOVERSION N/A 03/29/2020   Procedure: TRANSESOPHAGEAL ECHOCARDIOGRAM (TEE);  Surgeon: Kate Sable, MD;  Location: [facility] ORS;  Service: Cardiovascular;  Laterality: N/A;    Current Medications: Current Meds   Medication Sig   ALPRAZolam (XANAX XR) 1 MG 24 hr tablet Take 1 tablet (1 mg total) by mouth every morning.   clopidogrel (PLAVIX) 75 MG tablet Take 75 mg by mouth daily.   cyclobenzaprine (FLEXERIL) 10 MG tablet TAKE 1 TABLET(10 MG) BY MOUTH AT BEDTIME   digoxin (LANOXIN) 0.125 MG tablet Take 1 tablet (0.125 mg total) by mouth daily.   fluticasone furoate-vilanterol (BREO ELLIPTA) 100-25 MCG/ACT AEPB Inhale 1 puff into the lungs daily.   lamoTRIgine (LAMICTAL) 100 MG tablet Take 100 mg by mouth 2 (two) times daily.   midodrine (PROAMATINE) 5 MG tablet Take 1 tablet (5 mg) by mouth three times a day at 7 am, 11 am, & 3 pm   pantoprazole (PROTONIX) 20 MG tablet Take 1 tablet (20 mg total) by mouth every morning.   PARoxetine (PAXIL-CR) 25 MG 24 hr tablet TAKE 2 TABLET BY MOUTH DAILY   sodium chloride 1 g tablet Take 1 tablet (1 g total) by mouth 3 (three) times daily.   UBRELVY 100 MG TABS Take 100 mg by mouth daily as needed (migraine).    Vitamin D, Ergocalciferol, (DRISDOL) 1.25 MG (50000 UNIT) CAPS capsule TAKE 1 CAPSULE BY MOUTH EVERY 7 DAYS   zolpidem (AMBIEN CR) 12.5 MG CR tablet  Take 1 tablet (12.5 mg total) by mouth at bedtime as needed for sleep.     Allergies:   Azithromycin and Penicillins   Social History   Socioeconomic History   Marital status: Married    Spouse name: John    Number of children: 4   Years of education: Not on file   Highest education level: Not on file  Occupational History   Occupation: Aeronautical engineer: [employer]  Tobacco Use   Smoking status: Never   Smokeless tobacco: Never  Vaping Use   Vaping Use: Never used  Substance and Sexual Activity   Alcohol use: No    Alcohol/week: 0.0 standard drinks of alcohol   Drug use: No   Sexual activity: Yes    Partners: Male  Other Topics Concern   Not on file  Social History Narrative   First husband died suddenly in 16-Jun-2014   Remarried 06/07/2016   Social Determinants of Health    Financial Resource Strain: Medium Risk (08/12/2017)   Overall Financial Resource Strain (CARDIA)    Difficulty of Paying Living Expenses: Somewhat hard  Food Insecurity: No Food Insecurity (08/12/2017)   Hunger Vital Sign    Worried About Running Out of Food in the Last Year: Never true    Ran Out of Food in the Last Year: Never true  Transportation Needs: No Transportation Needs (08/12/2017)   PRAPARE - Hydrologist (Medical): No    Lack of Transportation (Non-Medical): No  Physical Activity: Sufficiently Active (08/12/2017)   Exercise Vital Sign    Days of Exercise per Week: 5 days    Minutes of Exercise per Session: 30 min  Stress: No Stress Concern Present (08/12/2017)   [place]    Feeling of Stress : Not at all  Social Connections: Somewhat Isolated (09/03/2017)   Social Connection and Isolation Panel [NHANES]    Frequency of Communication with Friends and Family: More than three times a week    Frequency of Social Gatherings with Friends and Family: More than three times a week    Attends Religious Services: Never    Marine scientist or Organizations: No    Attends Music therapist: Never    Marital Status: Married     Family History: The patient's family history includes Heart Problems in her father; Hyperlipidemia in her mother; Hypertension in her mother; Prostate cancer in her maternal grandfather; Von Willebrand disease in her maternal uncle.  ROS:   Please see the history of present illness.     All other systems reviewed and are negative.  EKGs/Labs/Other Studies Reviewed:    The following studies were reviewed today:  Heart monitor 08/2021 Patch Wear Time:  2 days and 22 hours (2023-06-16T10:36:19-0400 to 2023-06-19T09:24:56-0400)   Patient had a min HR of 66 bpm, max HR of 154 bpm, and avg HR of 95 bpm. Predominant underlying rhythm was Sinus  Rhythm. No Isolated SVEs, SVE Couplets, or SVE Triplets were present. Isolated VEs were frequent (7.8%, [id number]), VE Couplets were rare (<1.0%,  297), and VE Triplets were rare (<1.0%, 1). Ventricular Bigeminy and Trigeminy were present.    Conclusions No significant arrhythmia  Triggered events assoc with sinus   Myoview Lexiscan June 15, 2021 Narrative & Impression      The study is normal. The study is low risk.   No ST deviation was noted.   LV perfusion is normal. There is  no evidence of ischemia. There is no evidence of infarction.   Left ventricular function is normal. Nuclear stress EF: 80 %. The left ventricular ejection fraction is hyperdynamic (>65%). End diastolic cavity size is normal. End systolic cavity size is normal.   CT attenuation images showed no evidence of coronary or aortic calcifications.    Echo 07/2021  1. Left ventricular ejection fraction, by estimation, is 60 to 65%. The  left ventricle has normal function. The left ventricle has no regional  wall motion abnormalities. Left ventricular diastolic parameters are  consistent with Grade I diastolic  dysfunction (impaired relaxation).   2. Right ventricular systolic function is normal. The right ventricular  size is normal. Tricuspid regurgitation signal is inadequate for assessing  PA pressure.   3. The mitral valve is normal in structure. No evidence of mitral valve  regurgitation. No evidence of mitral stenosis.   4. The aortic valve is normal in structure. Aortic valve regurgitation is  not visualized. No aortic stenosis is present.   5. The inferior vena cava is normal in size with greater than 50%  respiratory variability, suggesting right atrial pressure of 3 mmHg.   6. Frequent PVCs noted.   Heart monitor 02/2021 Patch Wear Time:  2 days and 23 hours (2022-12-21T13:00:32-0500 to 2022-12-24T12:28:21-0500)   Patient had a min HR of 68 bpm, max HR of 158 bpm, and avg HR of 94 bpm. Predominant underlying rhythm  was Sinus Rhythm. Isolated SVEs were rare (<1.0%), SVE Couplets were rare (<1.0%), and no SVE Triplets were present. Isolated VEs were rare (<1.0%),  and no VE Couplets or VE Triplets were present.    Triggered events >> sinus   Some portion of  hours Sinus HR average > 100    f[place] scheduled next week   Echo TEE 03/2020 Procedure: 10cc of viscous lidocaine were given orally to provide local  anesthesia to the oropharynx. The patient was positioned supine on the  left side, bite block provided. The patient was moderately sedated with  the doses of versed and fentanyl as detailed below.  Using digital  technique an omniplane probe was advanced into the esophagus without  incident.   Moderate sedation:  1. Sedation used:  Versed: 2mg , Fentanyl: 34mcg  2. Time administered:   9:18  Time when patient started recovery: 9:35  3. I was face to face during this time: 17 mins   See report in EPIC  for complete details:  In brief, imaging revealed normal LV function with no RWMAs and no mural  apical thrombus.  .  Estimated ejection fraction was 60%.  Right sided  cardiac chambers were normal with no evidence of pulmonary hypertension.   Imaging of the septum showed no ASD or VSD, Interatrial septum appears  aneurysmal.  Bubble study was negative for shunt  2D and color flow confirmed no PFO    The LA was well visualized in orthogonal views.  There was no spontaneous  contrast and no thrombus in the LA and LA appendage   The descending thoracic aorta had no  mural aortic debris with no evidence  of aneurysmal dilation or disection   Recommendations  Will refer patient to heart valve/structural clinic overseen by Dr. Burt Knack  for atrial septal device consideration due to redundant atrial septum and  recurrent strokes.  Patient has had implantable loop recorder which has  not shown any arrhythmias such as A. fib or flutter that might be  contributing to strokes.   Echo  02/2020 1.  Left ventricular ejection fraction, by estimation, is 60 to 65%. The  left ventricle has normal function. The left ventricle has no regional  wall motion abnormalities. Left ventricular diastolic parameters are  consistent with Grade I diastolic  dysfunction (impaired relaxation).   2. Right ventricular systolic function is normal. The right ventricular  size is normal.    EKG:  EKG is ordered today.  The ekg ordered today demonstrates NSR 88bpm, nonspecific T wave changes  Recent Labs: 08/29/2021: ALT 18 08/30/2021: Hemoglobin 12.6; Platelets 235 08/31/2021: BUN 20; Creatinine, Ser 1.00; Magnesium 2.3; Potassium 3.8; Sodium 140  Recent Lipid Panel    Component Value Date/Time   CHOL 178 08/30/2021 0416   CHOL 231 (H) 08/24/2021 1355   TRIG 251 (H) 08/30/2021 0416   HDL 42 08/30/2021 0416   HDL 40 08/24/2021 1355   CHOLHDL 4.2 08/30/2021 0416   VLDL 50 (H) 08/30/2021 0416   LDLCALC 86 08/30/2021 0416   LDLCALC 119 (H) 08/24/2021 1355   LDLCALC 118 (H) 05/04/2019 0929    Physical Exam:    VS:  BP 130/86 (BP Location: Left Arm, Patient Position: Sitting, Cuff Size: Normal)   Pulse 88   Ht 5\' 9"  (1.753 m)   Wt 192 lb 12.8 oz (87.5 kg)   LMP 01/15/2015   SpO2 98%   BMI 28.47 kg/m     Wt Readings from Last 3 Encounters:  11/09/21 192 lb 12.8 oz (87.5 kg)  11/06/21 190 lb 6.4 oz (86.4 kg)  09/13/21 195 lb 6.4 oz (88.6 kg)     GEN:  Well nourished, well developed in no acute distress HEENT: Normal NECK: No JVD; No carotid bruits LYMPHATICS: No lymphadenopathy CARDIAC: RRR, no murmurs, rubs, gallops RESPIRATORY:  Clear to auscultation without rales, wheezing or rhonchi  ABDOMEN: Soft, non-tender, non-distended MUSCULOSKELETAL:  No edema; No deformity  SKIN: Warm and dry NEUROLOGIC:  Alert and oriented x 3 PSYCHIATRIC:  Normal affect   ASSESSMENT:    1. Other fatigue   2. Premature ventricular contraction   3. Essential hypertension   4. Intractable tension-type  headache, unspecified chronicity pattern   5. Dysautonomia ([place])   6. History of stroke   7. Cryptogenic stroke [facility])    PLAN:    In order of problems listed above:  Fatigue/Headache Patient overall reports vague symptoms, not specific to heart issues. She feels a dysautonomia exacerbation may be upcoming. 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 She denies any fever or chills. She tested negative for COVID. She has sleeping pills, but has not taken them. Appetite is poor. Vitals are stable. EKG without changes.Vitals are good, patient reports BP higher than normal. Echo in 07/2021 showed normal LVEF with G1DD. She appears euvolemic on exam. She also has a h/o migraines on 2 different mediations for this. I recommended watching and waiting at this point. I don't think further testing is required. I recommended good hydration/diet, use sleeping pills as needed and activity as tolerated. Follow up with Dr. Caryl Comes in 3 months.  Dysautonomia She takes midodrine 5mg  TID and salt tablets three times daily.   PVCs Most recent heart monitor showed 7.8% PVC burden. She is not on a BB.   Recurrent strokes Multiple recurrent strokes over the years, most recent was 08/2021, however the scans showed nothing acute. She is on Plavix and following with neurology, she underwent muscle testing and plan to do an MRI  of the neck. Continue Rosuvastatin.  Disposition: Follow up in 3 month(s) with Dr. Caryl Comes     Signed, [place], PA-C  11/10/2021 7:04 PM    [facility]

## 2021-11-12 ENCOUNTER — Other Ambulatory Visit: Payer: Self-pay | Admitting: Family Medicine

## 2021-11-12 ENCOUNTER — Telehealth: Payer: Self-pay | Admitting: Physician Assistant

## 2021-11-12 DIAGNOSIS — E559 Vitamin D deficiency, unspecified: Secondary | ICD-10-CM

## 2021-11-12 NOTE — Telephone Encounter (Signed)
Called patient to follow up regarding painful swallowing from her last office visit last week. Left a message on mobile voicemail.

## 2021-11-13 ENCOUNTER — Ambulatory Visit: Payer: BC Managed Care – PPO | Admitting: Medical

## 2021-11-13 ENCOUNTER — Other Ambulatory Visit: Payer: Self-pay | Admitting: *Deleted

## 2021-11-15 ENCOUNTER — Telehealth: Payer: Self-pay | Admitting: Cardiology

## 2021-11-15 MED ORDER — MIDODRINE HCL 5 MG PO TABS: ORAL_TABLET | ORAL | 0 refills | Status: DC

## 2021-11-15 NOTE — Telephone Encounter (Signed)
*  STAT* If patient is at the pharmacy, call can be transferred to refill team.   1. Which medications need to be refilled? (please list name of each medication and dose if known) midodrine (PROAMATINE) 5 MG tablet  2. Which pharmacy/location (including street and city if local pharmacy) is medication to be sent to? [id number] - [place], [place] - [address]  3. Do they need a 30 day or 90 day supply? 90 day   Patient is completely out of medication.

## 2021-11-21 ENCOUNTER — Other Ambulatory Visit: Payer: Self-pay | Admitting: Student

## 2021-11-21 DIAGNOSIS — M542 Cervicalgia: Secondary | ICD-10-CM

## 2021-11-21 DIAGNOSIS — R200 Anesthesia of skin: Secondary | ICD-10-CM

## 2021-11-22 ENCOUNTER — Ambulatory Visit: Payer: BC Managed Care – PPO | Admitting: Internal Medicine

## 2021-11-28 ENCOUNTER — Other Ambulatory Visit: Payer: Self-pay | Admitting: Family Medicine

## 2021-11-28 DIAGNOSIS — I69354 Hemiplegia and hemiparesis following cerebral infarction affecting left non-dominant side: Secondary | ICD-10-CM

## 2021-11-28 DIAGNOSIS — E785 Hyperlipidemia, unspecified: Secondary | ICD-10-CM

## 2021-11-29 NOTE — Progress Notes (Signed)
Name: Alicia [name] Note Koike   MRN: [id number]    DOB: 1970-09-14   Date:11/30/2021       Progress Note  Subjective  Chief Complaint  Follow Up  HPI  CVA with hemiparesis : 12/18/2016, admitted to [facility] , mild distal left PICA. She is feeling better, but still has lack of balance but not using a cane, episodes of dysarthria, continues to have tremors, and has some left side weakness .  She still has some short term memory difficulties and difficulty finding the correct words  to express herself at times.  She had at least 4 seizure like activities first one in 2018 ,  09/2017, had one in April after surgery 05/2019, she states had a similar episode Fall 2022 .She states when at work and super tired she falls asleep on her desk. She feels very sleepy and lethargic and has to put her head down, it can last 8-9 minutes and when she wakes up she is able to function. At times she has episodes of nausea, gets confused and has to rest She  is taking Crestor. She recently went to [facility] - Dr. Claris Gladden  02/2021 , had repeat MRI and advised to stop aspirin and start Plavix  since studies showed not responsive to aspirin, there is still a concern for mild FMD in her cervical ICA and vertebral arteries. Possible PFO but cardiac MRI negative for cardio embolic source. She had another episode of confusion and left side weakness on 07/23  but CTA, MRI and CT all negative except for old lacunar infarct - she states still feeling weak on left side and some dysarthria , she has mental fogginess, missing work sometimes due to difficulty functioning at times.   Summary of transcranial doppler 05/30/2020 No HITS at rest or during Valsalva. Negative transcranial Doppler Bubble study with no evidence of right to left intracardiac communication. A vascular evaluation was performed. The right middle cerebral artery was studied. An IV was inserted into the patient's left antecubital. Verbal informed consent was  obtained. Negative TCD Bubble study  Cardiac MRI heart: 12/11/2020   Normal EF, unremarkable  Migraine : Currently on Lamictal and also prn Ubrelvy, she states episodes less frequent now, about twice a month. She misses work about 4 hours q 2 weeks due to migraine headaches. She states described as a band around her head and associated with phonophobia and photophobia   Orthostatic hypotension with tachycardia:   and had  TEE for evaluation of TIA's and strokes to rule out foramen ovale - so far no structural heart disease found,  she is still taking  digoxin and midodrine for BP TID 5mg  dose. . She it. She continues to have episodes of blanking out -diagnosed with dysautonomy. 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 She already had some brain fogginess before COVID-19 but feels worse since COVID , she also has intermittent chest tightness COVID-19 , she had a second episode of COVID-05 Feb 2021. She feels like myalgia and fatigues , she feels like muscle aches and joint pains are getting worse,  Discussed referral to PMR   GAD/MDD : Husband died suddenly of a brain aneurysm back in  October 23 rd, 2016.  She has been taking her antidepressant medication and Alprazolam as prescribed.  She remarried her high school sweet heart 06/07/2016 ( and he moved here from [place])  Had a stroke 12/2016. We adjusted Paxil  dose from 40 mg to 60 mg Fall of 2020 and symptoms were controlled, we switched sustained release 37.5  but she noticed it was not working as well so she added 20 mg immediate release. Insurance denied paying for two separate formulations and is currently taking two  25 CR doses but feels like it is not enough. Currently on 50 mg and symptoms are stable.   She has more meetings and feels overwhelmed by being around a lot of people She is still avoiding crowed placed, work is excusing her  from larger meetings (more than 6 people) . She states now very anxious about her overall health . She has syncopal episodes when around a large group of people   Hypertriglyceridemia: currently off Vascepa due to cost and nausea, she also stopped taking Crestor but states it was causing her headaches and muscle pain. LDL during hospital stay was up to 86, HDL 42 , triglycerides was up    Pre-diabetes : last A1C was 6 % back in July 2023  She denies polyphagia, polydipsia or polyuria.  Insomnia: taking Ambien and is taking her less than 30 minutes to fall asleep .She is aware of long term risk of Ambien, but states cannot sleep without it. She also knows that the dose she is taking is not FDA approved for females. She states medication is working now, when tired enough able to fall and stay asleep. We tried going down on flexeril because of compounding sedative effect, but neck pain and headaches got worse, she was unable to go down on Flexeril but lower dose did not work for neck spasms. She needs refill of Ambien     Neck spasms: she takes Flexeril in the afternoon to improve her neck spasms, she also has some other muscle aches intermittent but neck is the worst .Unchanged    Thyroid nodule:  also has low T3 but normal TSH  IMPRESSION: 12/2019  1. Mildly heterogeneous but otherwise unremarkable thyroid gland without evidence of nodule. 2. The palpable abnormality corresponds with a small 0.6 cm lymph node. Favor reactive lymphadenopathy. Recommend continued clinical surveillance. If there is evidence of growth over time, repeat imaging and potentially biopsy may become warranted.  GERD:she had an esophogram, barium swallow done on 05/19/2019 that showed mild GERD, she is on pantoprazole, in am's , symptoms controlled with medication. She tried to stop but symptoms returns in the form of epigastirc pain but today she is willing to go down on the dose  OSA: wearing CPAP every night now  She is  still compliant    Reactive Airway Dysfunction syndrome: she saw Jana Half, pulmonologist, advised to stop Symbicort , take prn albuterol and is now on Breo . She had COVID Dec 2021 and again Dec 2022 - she continues to have intermittent SOB , difficulty catching her breath   Leukocytoclastic vasculitis: based on skin biopsy done by Dr. Gwen Pounds, multiple labs done and negative, she has seen Dr. Renard Matter since and had multiple labs that was negative for auto immune disorder was found, she advised follow up if new TIA/CVA events. Advised her to go back to see Rheumatologist , she states she will schedule a follow up   Patient Active Problem List   Diagnosis Date Noted   Essential hypertension 08/30/2021   Acute CVA (cerebrovascular accident) (HCC) 08/29/2021   Cerebrovascular accident (CVA) (HCC)    COVID-19 long hauler manifesting chronic fatigue 03/21/2020   Orthostatic  hypotension 02/23/2020   Atypical chest pain 02/23/2020   Pneumonia due to COVID-19 virus 02/07/2020   Reactive airways dysfunction syndrome (HCC) 10/15/2019   Hyperfunction of pituitary gland, unspecified (HCC) 10/15/2019   Calculus of gallbladder without cholecystitis without obstruction 05/06/2019   Cyst of pancreas 05/06/2019   Right kidney stone 05/06/2019   Leukocytoclastic vasculitis (HCC) 10/07/2018   Chronic insomnia 02/09/2018   History of anemia 02/09/2018   Sleep apnea 09/24/2017   History of ischemic stroke 05/30/2017   Hyperlipidemia 05/30/2017   Migraine without aura and without status migrainosus, not intractable 05/27/2017   Pain in right knee 01/21/2017   Psychogenic nonepileptic seizure    Chronic pain syndrome    Restless leg syndrome    Chronic prescription benzodiazepine use 01/20/2016   Leukocytosis 01/20/2016   Seizure-like activity (HCC)    GAD (generalized anxiety disorder) 02/14/2015   Chronic neck pain 02/14/2015   History of hysterectomy 02/14/2015   Iron deficiency anemia due to  chronic blood loss 08/29/2014   Insomnia, persistent 08/14/2014   Major depression (HCC) 08/14/2014   Temporomandibular joint sounds on opening and/or closing the jaw 08/14/2014   Degenerative disc disease, lumbar 08/14/2014   Bleeding internal hemorrhoids 08/14/2014   Gastric reflux 08/14/2014   Irritable bowel syndrome with constipation 08/14/2014   Hypertriglyceridemia 08/14/2014   Overweight 08/14/2014   Tinnitus 08/14/2014   Vitamin D deficiency 08/14/2014   Sinus tachycardia 11/25/2012   DOE (dyspnea on exertion) 11/06/2012    Past Surgical History:  Procedure Laterality Date   ABDOMINOPLASTY  Feb. 2015   CESAREAN SECTION  2011   CHOLECYSTECTOMY N/A 05/31/2019   Procedure: LAPAROSCOPIC CHOLECYSTECTOMY WITH INTRAOPERATIVE CHOLANGIOGRAM;  Surgeon: Earline Mayotte, MD;  Location: [facility] ORS;  Service: General;  Laterality: N/A;   COLONOSCOPY WITH PROPOFOL N/A 11/26/2016   Procedure: COLONOSCOPY WITH PROPOFOL;  Surgeon: Wyline Mood, MD;  Location: [facility] ENDOSCOPY;  Service: Gastroenterology;  Laterality: N/A;   COLONOSCOPY WITH PROPOFOL N/A 08/08/2020   Procedure: COLONOSCOPY WITH PROPOFOL;  Surgeon: Regis Bill, MD;  Location: [facility] ENDOSCOPY;  Service: Endoscopy;  Laterality: N/A;   CYSTOSCOPY  02/02/2015   Procedure: CYSTOSCOPY;  Surgeon: Nadara Mustard, MD;  Location: [facility] ORS;  Service: Gynecology;;   DILATION AND CURETTAGE OF UTERUS  2003, 2005, 2008   ESOPHAGOGASTRODUODENOSCOPY N/A 05/31/2019   Procedure: ESOPHAGOGASTRODUODENOSCOPY (EGD);  Surgeon: Earline Mayotte, MD;  Location: [facility] ORS;  Service: General;  Laterality: N/A;  in the O.R.   ESOPHAGOGASTRODUODENOSCOPY (EGD) WITH PROPOFOL N/A 11/26/2016   Procedure: ESOPHAGOGASTRODUODENOSCOPY (EGD) WITH PROPOFOL;  Surgeon: Wyline Mood, MD;  Location: [facility] ENDOSCOPY;  Service: Gastroenterology;  Laterality: N/A;   HERNIA REPAIR  1999   Umbilical   KNEE ARTHROSCOPY Right 2006   LAPAROSCOPIC BILATERAL SALPINGECTOMY  Bilateral 02/02/2015   Procedure: LAPAROSCOPIC BILATERAL SALPINGECTOMY;  Surgeon: Nadara Mustard, MD;  Location: [facility] ORS;  Service: Gynecology;  Laterality: Bilateral;   LAPAROSCOPIC HYSTERECTOMY N/A 02/02/2015   Procedure: HYSTERECTOMY TOTAL LAPAROSCOPIC;  Surgeon: Nadara Mustard, MD;  Location: [facility] ORS;  Service: Gynecology;  Laterality: N/A;   LOOP RECORDER INSERTION N/A 12/20/2016   Procedure: LOOP RECORDER INSERTION;  Surgeon: Regan Lemming, MD;  Location: [facility];  Service: Cardiovascular;  Laterality: N/A;   REDUCTION MAMMAPLASTY Bilateral December 2015   TEE WITHOUT CARDIOVERSION N/A 12/20/2016   Procedure: TRANSESOPHAGEAL ECHOCARDIOGRAM (TEE);  Surgeon: Lars Masson, MD;  Location: [facility] ENDOSCOPY;  Service: Cardiovascular;  Laterality: N/A;   TEE WITHOUT CARDIOVERSION N/A 03/29/2020  Procedure: TRANSESOPHAGEAL ECHOCARDIOGRAM (TEE);  Surgeon: Debbe Odea, MD;  Location: [facility] ORS;  Service: Cardiovascular;  Laterality: N/A;    Family History  Problem Relation Age of Onset   Hypertension Mother    Hyperlipidemia Mother    Heart Problems Father        hole in heart and lower ventricles reversed   Prostate cancer Maternal Grandfather    Von Willebrand disease Maternal Uncle     Social History   Tobacco Use   Smoking status: Never   Smokeless tobacco: Never  Substance Use Topics   Alcohol use: No    Alcohol/week: 0.0 standard drinks of alcohol     Current Outpatient Medications:    Bempedoic Acid-Ezetimibe (NEXLIZET) 180-10 MG TABS, Take 1 tablet by mouth daily at 12 noon., Disp: 90 tablet, Rfl: 1   clopidogrel (PLAVIX) 75 MG tablet, Take 75 mg by mouth daily., Disp: , Rfl:    cyclobenzaprine (FLEXERIL) 10 MG tablet, TAKE 1 TABLET(10 MG) BY MOUTH AT BEDTIME, Disp: 90 tablet, Rfl: 1   digoxin (LANOXIN) 0.125 MG tablet, Take 1 tablet (0.125 mg total) by mouth daily., Disp: 90 tablet, Rfl: 0   fluticasone furoate-vilanterol (BREO ELLIPTA) 100-25  MCG/ACT AEPB, Inhale 1 puff into the lungs daily., Disp: 1 each, Rfl: 2   lamoTRIgine (LAMICTAL) 100 MG tablet, Take 100 mg by mouth 2 (two) times daily., Disp: , Rfl:    midodrine (PROAMATINE) 5 MG tablet, Take 1 tablet (5 mg) by mouth three times a day at 7 am, 11 am, & 3 pm, Disp: 90 tablet, Rfl: 0   pantoprazole (PROTONIX) 20 MG tablet, Take 1 tablet (20 mg total) by mouth every morning., Disp: 90 tablet, Rfl: 1   PARoxetine (PAXIL-CR) 25 MG 24 hr tablet, TAKE 2 TABLET BY MOUTH DAILY, Disp: 180 tablet, Rfl: 1   sodium chloride 1 g tablet, Take 1 tablet (1 g total) by mouth 3 (three) times daily., Disp: 90 tablet, Rfl: 5   UBRELVY 100 MG TABS, Take 100 mg by mouth daily as needed (migraine). , Disp: , Rfl:    ALPRAZolam (XANAX XR) 1 MG 24 hr tablet, Take 1 tablet (1 mg total) by mouth every morning., Disp: 90 tablet, Rfl: 0   Dapsone (ACZONE) 7.5 % GEL, Apply 1 application. topically as directed. Qd to bid to aa face (Patient not taking: Reported on 11/09/2021), Disp: 60 g, Rfl: 11   hydrocortisone 2.5 % cream, Apply topically 3 (three) times a week. Apply to scaly areas face 3 times weekly Tuesday, Thursday, Saturday (Patient not taking: Reported on 11/06/2021), Disp: 30 g, Rfl: 3   pimecrolimus (ELIDEL) 1 % cream, Apply topically 2 (two) times daily. (Patient not taking: Reported on 11/06/2021), Disp: 30 g, Rfl: 0   Vitamin D, Ergocalciferol, (DRISDOL) 1.25 MG (50000 UNIT) CAPS capsule, TAKE 1 CAPSULE BY MOUTH EVERY 7 DAYS (Patient not taking: Reported on 11/30/2021), Disp: 12 capsule, Rfl: 1   zolpidem (AMBIEN CR) 12.5 MG CR tablet, Take 1 tablet (12.5 mg total) by mouth at bedtime as needed for sleep., Disp: 90 tablet, Rfl: 0  Allergies  Allergen Reactions   Azithromycin Diarrhea   Penicillins Hives and Swelling    Has patient had a PCN reaction causing immediate rash, facial/tongue/throat swelling, SOB or lightheadedness with hypotension: Yes Has patient had a PCN reaction causing severe rash  involving mucus membranes or skin necrosis: No Has patient had a PCN reaction that required hospitalization No Has patient had a PCN reaction occurring  within the last 10 years: No If all of the above answers are "NO", then may proceed with Cephalosporin use.    I personally reviewed active problem list, medication list, allergies, family history, social history, health maintenance with the patient/caregiver today.   ROS  Ten systems reviewed and is negative except as mentioned in HPI   Objective  Vitals:   11/30/21 0816  BP: 130/82  Pulse: 96  Resp: 16  Temp: 97.9 F (36.6 C)  TempSrc: Oral  SpO2: 100%  Weight: 191 lb 9.6 oz (86.9 kg)  Height: 5\' 8"  (1.727 m)    Body mass index is 29.13 kg/m.  Physical Exam  Constitutional: Patient appears well-developed and well-nourished.  No distress.  HEENT: head atraumatic, normocephalic, pupils equal and reactive to light, neck supple Cardiovascular: Normal rate, regular rhythm and normal heart sounds.  No murmur heard. No BLE edema. Pulmonary/Chest: Effort normal and breath sounds normal. No respiratory distress. Abdominal: Soft.  There is no tenderness. Psychiatric: Patient has a normal mood and affect. behavior is normal. Judgment and thought content normal.   PHQ2/9:    11/30/2021    8:19 AM 09/13/2021    9:43 AM 09/06/2021    8:17 AM 05/22/2021    8:19 AM 03/15/2021    7:41 AM  Depression screen PHQ 2/9  Decreased Interest 0 1 1 0 0  Down, Depressed, Hopeless 0 1 1 0 0  PHQ - 2 Score 0 2 2 0 0  Altered sleeping 0 1 1 0 0  Tired, decreased energy 0 1 1 0 0  Change in appetite 0 3 3 0 2  Feeling bad or failure about yourself  0 0 1 0 0  Trouble concentrating 0 0 1 0 0  Moving slowly or fidgety/restless 0 0 0 0 0  Suicidal thoughts 0 1 0 0 0  PHQ-9 Score 0 8 9 0 2  Difficult doing work/chores  Somewhat difficult Very difficult      phq 9 is positive   Fall Risk:    11/30/2021    8:19 AM 09/13/2021    9:42 AM  09/06/2021    8:17 AM 05/22/2021    8:19 AM 03/15/2021    7:35 AM  Fall Risk   Falls in the past year? 1 1 1 1 1   Number falls in past yr: 1 1 1 1  0  Injury with Fall? 1 1 1 1  0  Risk for fall due to : History of fall(s);Mental status change Impaired balance/gait;History of fall(s) History of fall(s);Impaired balance/gait No Fall Risks No Fall Risks  Follow up Education provided;Falls evaluation completed;Falls prevention discussed Education provided;Falls prevention discussed Falls evaluation completed Falls prevention discussed Falls prevention discussed     Assessment & Plan  1. Chronic insomnia  - zolpidem (AMBIEN CR) 12.5 MG CR tablet; Take 1 tablet (12.5 mg total) by mouth at bedtime as needed for sleep.  Dispense: 90 tablet; Refill: 0  2. GAD (generalized anxiety disorder)  - ALPRAZolam (XANAX XR) 1 MG 24 hr tablet; Take 1 tablet (1 mg total) by mouth every morning.  Dispense: 90 tablet; Refill: 0  3. Migraine without aura and without status migrainosus, not intractable  Doing better, sees neurologist   4. Hemiparesis of left nondominant side as late effect of cerebral infarction (HCC)  - Bempedoic Acid-Ezetimibe (NEXLIZET) 180-10 MG TABS; Take 1 tablet by mouth daily at 12 noon.  Dispense: 90 tablet; Refill: 1  5. Recurrent strokes (HCC)  - Bempedoic Acid-Ezetimibe (  NEXLIZET) 180-10 MG TABS; Take 1 tablet by mouth daily at 12 noon.  Dispense: 90 tablet; Refill: 1  6. Vitamin D deficiency   7. Chronic pain syndrome   8. COVID-19 long hauler  - Ambulatory referral to Physical Medicine Rehab  9. Dyslipidemia  - Bempedoic Acid-Ezetimibe (NEXLIZET) 180-10 MG TABS; Take 1 tablet by mouth daily at 12 noon.  Dispense: 90 tablet; Refill: 1

## 2021-11-30 ENCOUNTER — Encounter: Payer: Self-pay | Admitting: Family Medicine

## 2021-11-30 ENCOUNTER — Ambulatory Visit: Payer: BC Managed Care – PPO | Admitting: Family Medicine

## 2021-11-30 VITALS — BP 130/82 | HR 96 | Temp 97.9°F | Resp 16 | Ht 68.0 in | Wt 191.6 lb

## 2021-11-30 DIAGNOSIS — F411 Generalized anxiety disorder: Secondary | ICD-10-CM

## 2021-11-30 DIAGNOSIS — F5104 Psychophysiologic insomnia: Secondary | ICD-10-CM

## 2021-11-30 DIAGNOSIS — G43009 Migraine without aura, not intractable, without status migrainosus: Secondary | ICD-10-CM | POA: Diagnosis not present

## 2021-11-30 DIAGNOSIS — I639 Cerebral infarction, unspecified: Secondary | ICD-10-CM

## 2021-11-30 DIAGNOSIS — I69354 Hemiplegia and hemiparesis following cerebral infarction affecting left non-dominant side: Secondary | ICD-10-CM

## 2021-11-30 DIAGNOSIS — E785 Hyperlipidemia, unspecified: Secondary | ICD-10-CM

## 2021-11-30 DIAGNOSIS — G894 Chronic pain syndrome: Secondary | ICD-10-CM

## 2021-11-30 DIAGNOSIS — E559 Vitamin D deficiency, unspecified: Secondary | ICD-10-CM

## 2021-11-30 DIAGNOSIS — U099 Post covid-19 condition, unspecified: Secondary | ICD-10-CM

## 2021-11-30 MED ORDER — NEXLIZET 180-10 MG PO TABS: 1.0000 | ORAL_TABLET | Freq: Every day | ORAL | 1 refills | Status: DC

## 2021-11-30 MED ORDER — ZOLPIDEM TARTRATE ER 12.5 MG PO TBCR: 12.5000 mg | EXTENDED_RELEASE_TABLET | Freq: Every evening | ORAL | 0 refills | Status: DC | PRN

## 2021-11-30 MED ORDER — ALPRAZOLAM ER 1 MG PO TB24: 1.0000 mg | ORAL_TABLET | ORAL | 0 refills | Status: DC

## 2021-12-07 ENCOUNTER — Ambulatory Visit
Admission: RE | Admit: 2021-12-07 | Discharge: 2021-12-07 | Disposition: A | Discharge status: Home / Self Care | Payer: BC Managed Care – PPO | Source: Ambulatory Visit | Attending: Student | Admitting: Student

## 2021-12-07 DIAGNOSIS — M542 Cervicalgia: Secondary | ICD-10-CM

## 2021-12-07 DIAGNOSIS — R200 Anesthesia of skin: Secondary | ICD-10-CM

## 2021-12-07 DIAGNOSIS — M4802 Spinal stenosis, cervical region: Secondary | ICD-10-CM | POA: Diagnosis not present

## 2021-12-14 ENCOUNTER — Encounter: Payer: Self-pay | Admitting: Family Medicine

## 2021-12-14 ENCOUNTER — Telehealth: Payer: Self-pay

## 2021-12-14 MED ORDER — SODIUM CHLORIDE 1 G PO TABS: 1.0000 g | ORAL_TABLET | Freq: Three times a day (TID) | ORAL | 0 refills | Status: DC

## 2021-12-14 MED ORDER — MIDODRINE HCL 5 MG PO TABS: ORAL_TABLET | ORAL | 0 refills | Status: DC

## 2021-12-14 NOTE — Telephone Encounter (Signed)
Refill sent for Midodrine and sodium chloride

## 2021-12-14 NOTE — Telephone Encounter (Signed)
PA approved.

## 2022-01-02 DIAGNOSIS — R202 Paresthesia of skin: Secondary | ICD-10-CM | POA: Diagnosis not present

## 2022-01-02 DIAGNOSIS — Z87898 Personal history of other specified conditions: Secondary | ICD-10-CM | POA: Diagnosis not present

## 2022-01-02 DIAGNOSIS — M542 Cervicalgia: Secondary | ICD-10-CM | POA: Diagnosis not present

## 2022-01-02 DIAGNOSIS — I63442 Cerebral infarction due to embolism of left cerebellar artery: Secondary | ICD-10-CM | POA: Diagnosis not present

## 2022-01-14 DIAGNOSIS — K760 Fatty (change of) liver, not elsewhere classified: Secondary | ICD-10-CM | POA: Diagnosis not present

## 2022-01-14 DIAGNOSIS — K862 Cyst of pancreas: Secondary | ICD-10-CM | POA: Diagnosis not present

## 2022-01-17 ENCOUNTER — Other Ambulatory Visit: Payer: Self-pay | Admitting: Student

## 2022-01-17 DIAGNOSIS — R202 Paresthesia of skin: Secondary | ICD-10-CM

## 2022-01-23 ENCOUNTER — Ambulatory Visit
Admission: RE | Admit: 2022-01-23 | Discharge: 2022-01-23 | Disposition: A | Discharge status: Home / Self Care | Payer: BC Managed Care – PPO | Source: Ambulatory Visit | Attending: Student | Admitting: Student

## 2022-01-23 DIAGNOSIS — R202 Paresthesia of skin: Secondary | ICD-10-CM | POA: Diagnosis not present

## 2022-01-23 DIAGNOSIS — M48061 Spinal stenosis, lumbar region without neurogenic claudication: Secondary | ICD-10-CM | POA: Diagnosis not present

## 2022-01-25 ENCOUNTER — Encounter: Payer: Self-pay | Admitting: Family Medicine

## 2022-01-25 ENCOUNTER — Other Ambulatory Visit: Payer: Self-pay

## 2022-01-25 DIAGNOSIS — M5126 Other intervertebral disc displacement, lumbar region: Secondary | ICD-10-CM | POA: Diagnosis not present

## 2022-01-25 DIAGNOSIS — M5030 Other cervical disc degeneration, unspecified cervical region: Secondary | ICD-10-CM | POA: Diagnosis not present

## 2022-01-25 DIAGNOSIS — M5416 Radiculopathy, lumbar region: Secondary | ICD-10-CM | POA: Diagnosis not present

## 2022-01-25 DIAGNOSIS — M5412 Radiculopathy, cervical region: Secondary | ICD-10-CM | POA: Diagnosis not present

## 2022-01-25 MED ORDER — SODIUM CHLORIDE 1 G PO TABS: 1.0000 g | ORAL_TABLET | Freq: Three times a day (TID) | ORAL | 0 refills | Status: DC

## 2022-01-25 MED ORDER — MIDODRINE HCL 5 MG PO TABS: ORAL_TABLET | ORAL | 0 refills | Status: DC

## 2022-01-28 ENCOUNTER — Other Ambulatory Visit: Payer: Self-pay

## 2022-01-28 ENCOUNTER — Other Ambulatory Visit: Payer: Self-pay | Admitting: Family Medicine

## 2022-01-28 DIAGNOSIS — G894 Chronic pain syndrome: Secondary | ICD-10-CM

## 2022-01-28 DIAGNOSIS — G8929 Other chronic pain: Secondary | ICD-10-CM

## 2022-01-28 MED ORDER — MIDODRINE HCL 5 MG PO TABS: ORAL_TABLET | ORAL | 0 refills | Status: DC

## 2022-01-28 MED ORDER — SODIUM CHLORIDE 1 G PO TABS: 1.0000 g | ORAL_TABLET | Freq: Three times a day (TID) | ORAL | 0 refills | Status: DC

## 2022-01-29 DIAGNOSIS — K5730 Diverticulosis of large intestine without perforation or abscess without bleeding: Secondary | ICD-10-CM | POA: Diagnosis not present

## 2022-01-29 DIAGNOSIS — K760 Fatty (change of) liver, not elsewhere classified: Secondary | ICD-10-CM | POA: Diagnosis not present

## 2022-01-29 DIAGNOSIS — Z23 Encounter for immunization: Secondary | ICD-10-CM | POA: Diagnosis not present

## 2022-01-29 DIAGNOSIS — Z8673 Personal history of transient ischemic attack (TIA), and cerebral infarction without residual deficits: Secondary | ICD-10-CM | POA: Diagnosis not present

## 2022-01-29 DIAGNOSIS — K8020 Calculus of gallbladder without cholecystitis without obstruction: Secondary | ICD-10-CM | POA: Diagnosis not present

## 2022-01-29 DIAGNOSIS — Z9049 Acquired absence of other specified parts of digestive tract: Secondary | ICD-10-CM | POA: Diagnosis not present

## 2022-01-29 DIAGNOSIS — N132 Hydronephrosis with renal and ureteral calculous obstruction: Secondary | ICD-10-CM | POA: Diagnosis not present

## 2022-01-29 DIAGNOSIS — K862 Cyst of pancreas: Secondary | ICD-10-CM | POA: Diagnosis not present

## 2022-01-29 DIAGNOSIS — Z7902 Long term (current) use of antithrombotics/antiplatelets: Secondary | ICD-10-CM | POA: Diagnosis not present

## 2022-02-07 DIAGNOSIS — M5030 Other cervical disc degeneration, unspecified cervical region: Secondary | ICD-10-CM | POA: Diagnosis not present

## 2022-02-07 DIAGNOSIS — M6281 Muscle weakness (generalized): Secondary | ICD-10-CM | POA: Diagnosis not present

## 2022-02-07 DIAGNOSIS — M5412 Radiculopathy, cervical region: Secondary | ICD-10-CM | POA: Diagnosis not present

## 2022-02-09 ENCOUNTER — Other Ambulatory Visit: Payer: Self-pay | Admitting: Family Medicine

## 2022-02-09 DIAGNOSIS — E559 Vitamin D deficiency, unspecified: Secondary | ICD-10-CM

## 2022-02-19 ENCOUNTER — Other Ambulatory Visit: Payer: Self-pay

## 2022-02-21 DIAGNOSIS — M5416 Radiculopathy, lumbar region: Secondary | ICD-10-CM | POA: Diagnosis not present

## 2022-02-21 DIAGNOSIS — M5126 Other intervertebral disc displacement, lumbar region: Secondary | ICD-10-CM | POA: Diagnosis not present

## 2022-02-22 NOTE — Progress Notes (Deleted)
Name: Alicia [name]   MRN: [id number]    DOB: 1970-06-05   Date:02/22/2022       Progress Note  Subjective  Chief Complaint  Follow Up  HPI  CVA with hemiparesis : 12/18/2016, admitted to [facility] , mild distal left PICA. She is feeling better, but still has lack of balance but not using a cane, episodes of dysarthria, continues to have tremors, and has some left side weakness .  She still has some short term memory difficulties and difficulty finding the correct words  to express herself at times.  She had at least 4 seizure like activities first one in 2018 ,  09/2017, had one in April after surgery 05/2019, she states had a similar episode Fall 2022 .She states when at work and super tired she falls asleep on her desk. She feels very sleepy and lethargic and has to put her head down, it can last 8-9 minutes and when she wakes up she is able to function. At times she has episodes of nausea, gets confused and has to rest She  is taking Crestor. She recently went to [facility] - Dr. Gevena Cotton  02/2021 , had repeat MRI and advised to stop aspirin and start Plavix  since studies showed not responsive to aspirin, there is still a concern for mild FMD in her cervical ICA and vertebral arteries. Possible PFO but cardiac MRI negative for cardio embolic source. She had another episode of confusion and left side weakness on 07/23  but CTA, MRI and CT all negative except for old lacunar infarct - she states still feeling weak on left side and some dysarthria , she has mental fogginess, missing work sometimes due to difficulty functioning at times.   Summary of transcranial doppler 05/30/2020 No HITS at rest or during Valsalva. Negative transcranial Doppler Bubble study with no evidence of right to left intracardiac communication. A vascular evaluation was performed. The right middle cerebral artery was studied. An IV was inserted into the patient's left antecubital. Verbal informed consent was  obtained. Negative TCD Bubble study  Cardiac MRI heart: 12/11/2020   Normal EF, unremarkable  Migraine : Currently on Lamictal and also prn Ubrelvy, she states episodes less frequent now, about twice a month. She misses work about 4 hours q 2 weeks due to migraine headaches. She states described as a band around her head and associated with phonophobia and photophobia   Orthostatic hypotension with tachycardia:   and had  TEE for evaluation of TIA's and strokes to rule out foramen ovale - so far no structural heart disease found,  she is still taking  digoxin and midodrine for BP TID 5mg  dose. . She it. She continues to have episodes of blanking out -diagnosed with dysautonomy. 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 She already had some brain fogginess before COVID-19 but feels worse since COVID , she also has intermittent chest tightness COVID-19 , she had a second episode of COVID-05 Feb 2021. She feels like myalgia and fatigues , she feels like muscle aches and joint pains are getting worse,  Discussed referral to PMR   GAD/MDD : Husband died suddenly of a brain aneurysm back in  October 23 rd, 2016.  She has been taking her antidepressant medication and Alprazolam as prescribed.  She remarried her high school sweet heart 06/07/2016 ( and he moved here from 06/09/2016)  Had a stroke 12/2016. We adjusted Paxil  dose from 40 mg to 60 mg Fall of 2020 and symptoms were controlled, we switched sustained release 37.5  but she noticed it was not working as well so she added 20 mg immediate release. Insurance denied paying for two separate formulations and is currently taking two  25 CR doses but feels like it is not enough. Currently on 50 mg and symptoms are stable.   She has more meetings and feels overwhelmed by being around a lot of people She is still avoiding crowed placed, work is excusing her  from larger meetings (more than 6 people) . She states now very anxious about her overall health . She has syncopal episodes when around a large group of people   Hypertriglyceridemia: currently off Vascepa due to cost and nausea, she also stopped taking Crestor but states it was causing her headaches and muscle pain. LDL during hospital stay was up to 86, HDL 42 , triglycerides was up    Pre-diabetes : last A1C was 6 % back in July 2023  She denies polyphagia, polydipsia or polyuria.  Insomnia: taking Ambien and is taking her less than 30 minutes to fall asleep .She is aware of long term risk of Ambien, but states cannot sleep without it. She also knows that the dose she is taking is not FDA approved for females. She states medication is working now, when tired enough able to fall and stay asleep. We tried going down on flexeril because of compounding sedative effect, but neck pain and headaches got worse, she was unable to go down on Flexeril but lower dose did not work for neck spasms. She needs refill of Ambien     Neck spasms: she takes Flexeril in the afternoon to improve her neck spasms, she also has some other muscle aches intermittent but neck is the worst .Unchanged    Thyroid nodule:  also has low T3 but normal TSH  IMPRESSION: 12/2019  1. Mildly heterogeneous but otherwise unremarkable thyroid gland without evidence of nodule. 2. The palpable abnormality corresponds with a small 0.6 cm lymph node. Favor reactive lymphadenopathy. Recommend continued clinical surveillance. If there is evidence of growth over time, repeat imaging and potentially biopsy may become warranted.  GERD:she had an esophogram, barium swallow done on 05/19/2019 that showed mild GERD, she is on pantoprazole, in am's , symptoms controlled with medication. She tried to stop but symptoms returns in the form of epigastirc pain but today she is willing to go down on the dose  OSA: wearing CPAP every night now  She is  still compliant    Reactive Airway Dysfunction syndrome: she saw Jana Half, pulmonologist, advised to stop Symbicort , take prn albuterol and is now on Breo . She had COVID Dec 2021 and again Dec 2022 - she continues to have intermittent SOB , difficulty catching her breath   Leukocytoclastic vasculitis: based on skin biopsy done by Dr. Gwen Pounds, multiple labs done and negative, she has seen Dr. Renard Matter since and had multiple labs that was negative for auto immune disorder was found, she advised follow up if new TIA/CVA events. Advised her to go back to see Rheumatologist , she states she will schedule a follow up   Patient Active Problem List   Diagnosis Date Noted   Essential hypertension 08/30/2021   Acute CVA (cerebrovascular accident) (HCC) 08/29/2021   Cerebrovascular accident (CVA) (HCC)    COVID-19 long hauler manifesting chronic fatigue 03/21/2020   Orthostatic  hypotension 02/23/2020   Atypical chest pain 02/23/2020   Pneumonia due to COVID-19 virus 02/07/2020   Reactive airways dysfunction syndrome (HCC) 10/15/2019   Hyperfunction of pituitary gland, unspecified (HCC) 10/15/2019   Calculus of gallbladder without cholecystitis without obstruction 05/06/2019   Cyst of pancreas 05/06/2019   Right kidney stone 05/06/2019   Leukocytoclastic vasculitis (HCC) 10/07/2018   Chronic insomnia 02/09/2018   History of anemia 02/09/2018   Sleep apnea 09/24/2017   History of ischemic stroke 05/30/2017   Hyperlipidemia 05/30/2017   Migraine without aura and without status migrainosus, not intractable 05/27/2017   Pain in right knee 01/21/2017   Psychogenic nonepileptic seizure    Chronic pain syndrome    Restless leg syndrome    Chronic prescription benzodiazepine use 01/20/2016   Leukocytosis 01/20/2016   Seizure-like activity (HCC)    GAD (generalized anxiety disorder) 02/14/2015   Chronic neck pain 02/14/2015   History of hysterectomy 02/14/2015   Iron deficiency anemia due to  chronic blood loss 08/29/2014   Insomnia, persistent 08/14/2014   Major depression (HCC) 08/14/2014   Temporomandibular joint sounds on opening and/or closing the jaw 08/14/2014   Degenerative disc disease, lumbar 08/14/2014   Bleeding internal hemorrhoids 08/14/2014   Gastric reflux 08/14/2014   Irritable bowel syndrome with constipation 08/14/2014   Hypertriglyceridemia 08/14/2014   Overweight 08/14/2014   Tinnitus 08/14/2014   Vitamin D deficiency 08/14/2014   Sinus tachycardia 11/25/2012   DOE (dyspnea on exertion) 11/06/2012    Past Surgical History:  Procedure Laterality Date   ABDOMINOPLASTY  Feb. 2015   CESAREAN SECTION  2011   CHOLECYSTECTOMY N/A 05/31/2019   Procedure: LAPAROSCOPIC CHOLECYSTECTOMY WITH INTRAOPERATIVE CHOLANGIOGRAM;  Surgeon: Earline Mayotte, MD;  Location: [facility] ORS;  Service: General;  Laterality: N/A;   COLONOSCOPY WITH PROPOFOL N/A 11/26/2016   Procedure: COLONOSCOPY WITH PROPOFOL;  Surgeon: Wyline Mood, MD;  Location: [facility] ENDOSCOPY;  Service: Gastroenterology;  Laterality: N/A;   COLONOSCOPY WITH PROPOFOL N/A 08/08/2020   Procedure: COLONOSCOPY WITH PROPOFOL;  Surgeon: Regis Bill, MD;  Location: [facility] ENDOSCOPY;  Service: Endoscopy;  Laterality: N/A;   CYSTOSCOPY  02/02/2015   Procedure: CYSTOSCOPY;  Surgeon: Nadara Mustard, MD;  Location: [facility] ORS;  Service: Gynecology;;   DILATION AND CURETTAGE OF UTERUS  2003, 2005, 2008   ESOPHAGOGASTRODUODENOSCOPY N/A 05/31/2019   Procedure: ESOPHAGOGASTRODUODENOSCOPY (EGD);  Surgeon: Earline Mayotte, MD;  Location: [facility] ORS;  Service: General;  Laterality: N/A;  in the O.R.   ESOPHAGOGASTRODUODENOSCOPY (EGD) WITH PROPOFOL N/A 11/26/2016   Procedure: ESOPHAGOGASTRODUODENOSCOPY (EGD) WITH PROPOFOL;  Surgeon: Wyline Mood, MD;  Location: [facility] ENDOSCOPY;  Service: Gastroenterology;  Laterality: N/A;   HERNIA REPAIR  1999   Umbilical   KNEE ARTHROSCOPY Right 2006   LAPAROSCOPIC BILATERAL SALPINGECTOMY  Bilateral 02/02/2015   Procedure: LAPAROSCOPIC BILATERAL SALPINGECTOMY;  Surgeon: Nadara Mustard, MD;  Location: [facility] ORS;  Service: Gynecology;  Laterality: Bilateral;   LAPAROSCOPIC HYSTERECTOMY N/A 02/02/2015   Procedure: HYSTERECTOMY TOTAL LAPAROSCOPIC;  Surgeon: Nadara Mustard, MD;  Location: [facility] OR[facility];  Service: Gynecology;  Laterality: N/A;   LOOP RECORDER INSERTION N/A 12/20/2016   Procedure: LOOP RECORDER INSERTION;  Surgeon: Regan Lemming, MD;  Location: [facility];  Service: Cardiovascular;  Laterality: N/A;   REDUCTION MAMMAPLASTY Bilateral December 2015   TEE WITHOUT CARDIOVERSION N/A 12/20/2016   Procedure: TRANSESOPHAGEAL ECHOCARDIOGRAM (TEE);  Surgeon: Lars Masson, MD;  Location: [facility] ENDOSCOPY;  Service: Cardiovascular;  Laterality: N/A;   TEE WITHOUT CARDIOVERSION N/A 03/29/2020  Procedure: TRANSESOPHAGEAL ECHOCARDIOGRAM (TEE);  Surgeon: Kate Sable, MD;  Location: [facility] ORS;  Service: Cardiovascular;  Laterality: N/A;    Family History  Problem Relation Age of Onset   Hypertension Mother    Hyperlipidemia Mother    Heart Problems Father        hole in heart and lower ventricles reversed   Prostate cancer Maternal Grandfather    Von Willebrand disease Maternal Uncle     Social History   Tobacco Use   Smoking status: Never   Smokeless tobacco: Never  Substance Use Topics   Alcohol use: No    Alcohol/week: 0.0 standard drinks of alcohol     Current Outpatient Medications:    ALPRAZolam (XANAX XR) 1 MG 24 hr tablet, Take 1 tablet (1 mg total) by mouth every morning., Disp: 90 tablet, Rfl: 0   Bempedoic Acid-Ezetimibe (NEXLIZET) 180-10 MG TABS, Take 1 tablet by mouth daily at 12 noon., Disp: 90 tablet, Rfl: 1   clopidogrel (PLAVIX) 75 MG tablet, Take 75 mg by mouth daily., Disp: , Rfl:    cyclobenzaprine (FLEXERIL) 10 MG tablet, TAKE 1 TABLET(10 MG) BY MOUTH AT BEDTIME, Disp: 90 tablet, Rfl: 1   Dapsone (ACZONE) 7.5 % GEL, Apply 1  application. topically as directed. Qd to bid to aa face (Patient not taking: Reported on 11/09/2021), Disp: 60 g, Rfl: 11   digoxin (LANOXIN) 0.125 MG tablet, Take 1 tablet (0.125 mg total) by mouth daily., Disp: 90 tablet, Rfl: 0   fluticasone furoate-vilanterol (BREO ELLIPTA) 100-25 MCG/ACT AEPB, Inhale 1 puff into the lungs daily., Disp: 1 each, Rfl: 2   hydrocortisone 2.5 % cream, Apply topically 3 (three) times a week. Apply to scaly areas face 3 times weekly Tuesday, Thursday, Saturday (Patient not taking: Reported on 11/06/2021), Disp: 30 g, Rfl: 3   lamoTRIgine (LAMICTAL) 100 MG tablet, Take 100 mg by mouth 2 (two) times daily., Disp: , Rfl:    midodrine (PROAMATINE) 5 MG tablet, Take 1 tablet (5 mg) by mouth three times a day at 7 am, 11 am, & 3 pm, Disp: 270 tablet, Rfl: 0   pantoprazole (PROTONIX) 20 MG tablet, Take 1 tablet (20 mg total) by mouth every morning., Disp: 90 tablet, Rfl: 1   PARoxetine (PAXIL-CR) 25 MG 24 hr tablet, TAKE 2 TABLET BY MOUTH DAILY, Disp: 180 tablet, Rfl: 1   pimecrolimus (ELIDEL) 1 % cream, Apply topically 2 (two) times daily. (Patient not taking: Reported on 11/06/2021), Disp: 30 g, Rfl: 0   sodium chloride 1 g tablet, Take 1 tablet (1 g total) by mouth 3 (three) times daily., Disp: 270 tablet, Rfl: 0   UBRELVY 100 MG TABS, Take 100 mg by mouth daily as needed (migraine). , Disp: , Rfl:    Vitamin D, Ergocalciferol, (DRISDOL) 1.25 MG (50000 UNIT) CAPS capsule, TAKE 1 CAPSULE BY MOUTH EVERY 7 DAYS, Disp: 12 capsule, Rfl: 1   zolpidem (AMBIEN CR) 12.5 MG CR tablet, Take 1 tablet (12.5 mg total) by mouth at bedtime as needed for sleep., Disp: 90 tablet, Rfl: 0  Allergies  Allergen Reactions   Azithromycin Diarrhea   Penicillins Hives and Swelling    Has patient had a PCN reaction causing immediate rash, facial/tongue/throat swelling, SOB or lightheadedness with hypotension: Yes Has patient had a PCN reaction causing severe rash involving mucus membranes or skin  necrosis: No Has patient had a PCN reaction that required hospitalization No Has patient had a PCN reaction occurring within the last 10 years: No  If all of the above answers are "NO", then may proceed with Cephalosporin use.    I personally reviewed active problem list, medication list, allergies, family history, social history, health maintenance with the patient/caregiver today.   ROS  ***  Objective  There were no vitals filed for this visit.  There is no height or weight on file to calculate BMI.  Physical Exam ***  No results found for this or any previous visit (from the past 2160 hour(s)).   PHQ2/9:    11/30/2021    8:19 AM 09/13/2021    9:43 AM 09/06/2021    8:17 AM 05/22/2021    8:19 AM 03/15/2021    7:41 AM  Depression screen PHQ 2/9  Decreased Interest 0 1 1 0 0  Down, Depressed, Hopeless 0 1 1 0 0  PHQ - 2 Score 0 2 2 0 0  Altered sleeping 0 1 1 0 0  Tired, decreased energy 0 1 1 0 0  Change in appetite 0 3 3 0 2  Feeling bad or failure about yourself  0 0 1 0 0  Trouble concentrating 0 0 1 0 0  Moving slowly or fidgety/restless 0 0 0 0 0  Suicidal thoughts 0 1 0 0 0  PHQ-9 Score 0 8 9 0 2  Difficult doing work/chores  Somewhat difficult Very difficult      phq 9 is {gen pos [id number]}   Fall Risk:    11/30/2021    8:19 AM 09/13/2021    9:42 AM 09/06/2021    8:17 AM 05/22/2021    8:19 AM 03/15/2021    7:35 AM  Fall Risk   Falls in the past year? 1 1 1 1 1   Number falls in past yr: 1 1 1 1  0  Injury with Fall? 1 1 1 1  0  Risk for fall due to : History of fall(s);Mental status change Impaired balance/gait;History of fall(s) History of fall(s);Impaired balance/gait No Fall Risks No Fall Risks  Follow up Education provided;Falls evaluation completed;Falls prevention discussed Education provided;Falls prevention discussed Falls evaluation completed Falls prevention discussed Falls prevention discussed      Functional Status Survey:       Assessment & Plan  *** There are no diagnoses linked to this encounter.

## 2022-02-25 ENCOUNTER — Ambulatory Visit: Payer: BC Managed Care – PPO | Admitting: Family Medicine

## 2022-03-05 ENCOUNTER — Other Ambulatory Visit: Payer: Self-pay | Admitting: Family Medicine

## 2022-03-05 DIAGNOSIS — J683 Other acute and subacute respiratory conditions due to chemicals, gases, fumes and vapors: Secondary | ICD-10-CM

## 2022-03-06 ENCOUNTER — Other Ambulatory Visit: Payer: Self-pay

## 2022-03-06 MED ORDER — DIGOXIN 125 MCG PO TABS: 0.1250 mg | ORAL_TABLET | Freq: Every day | ORAL | 0 refills | Status: DC

## 2022-03-07 ENCOUNTER — Ambulatory Visit: Payer: BC Managed Care – PPO | Attending: Internal Medicine | Admitting: Internal Medicine

## 2022-03-07 ENCOUNTER — Encounter: Payer: Self-pay | Admitting: Internal Medicine

## 2022-03-07 VITALS — HR 99 | Ht 68.0 in | Wt 189.0 lb

## 2022-03-07 DIAGNOSIS — R0609 Other forms of dyspnea: Secondary | ICD-10-CM | POA: Diagnosis not present

## 2022-03-07 DIAGNOSIS — I951 Orthostatic hypotension: Secondary | ICD-10-CM

## 2022-03-07 DIAGNOSIS — I639 Cerebral infarction, unspecified: Secondary | ICD-10-CM

## 2022-03-07 NOTE — Patient Instructions (Signed)
Medication Instructions:  - Your physician has recommended you make the following change in your medication:   1) STOP Digoxin  *If you need a refill on your cardiac medications before your next appointment, please call your pharmacy*   Lab Work: - none ordered  If you have labs (blood work) drawn today and your tests are completely normal, you will receive your results only by: [place] (if you have MyChart) OR A paper copy in the mail If you have any lab test that is abnormal or we need to change your treatment, we will call you to review the results.   Testing/Procedures: - none ordered   Follow-Up: At [facility], you and your health needs are our priority.  As part of our continuing mission to provide you with exceptional heart care, we have created designated Provider Care Teams.  These Care Teams include your primary Cardiologist (physician) and Advanced Practice Providers (APPs -  Physician Assistants and Nurse Practitioners) who all work together to provide you with the care you need, when you need it.  We recommend signing up for the patient portal called "MyChart".  Sign up information is provided on this After Visit Summary.  MyChart is used to connect with patients for Virtual Visits (Telemedicine).  Patients are able to view lab/test results, encounter notes, upcoming appointments, etc.  Non-urgent messages can be sent to your provider as well.   To learn more about what you can do with MyChart, go to [email or web address].    Your next appointment:   6 month(s)  Provider:   Virl Axe, MD    Other Instructions N/a

## 2022-03-07 NOTE — Progress Notes (Signed)
Electrophysiology TeleHealth Note   Due to national recommendations of social distancing due to COVID 19, an audio/video telehealth visit is felt to be most appropriate for this patient at this time.  See MyChart message from today for the patient's consent to telehealth for [facility].   Date:  03/07/2022   ID:  Alicia [name], Alicia [name] 1970/06/21, MRN [id number]  Location: patient's home  Provider location: [address], [place]  Evaluation Performed: Initial Evaluation  PCP:  Alba Cory, MD  Cardiologist:  Debbe Odea, MD  Electrophysiologist:  None   Chief Complaint:  palpitations and orhtostasis   History of Present Illness:    Alicia [name] is a 52 y.o. female who presents via audio/video conferencing for a telehealth visit today with a prior hx of PVCs, Cryptogenic Stroke with ILR and neg hypercoagulable workup and transcutaneous dopper, hx of COVID, recurrent synope in setting of chronic tachycardia, orthostasis treated with proamatine, PVCs (7.8 %)w recurrent palps presumably PVCs,   LH is much improved, standing slowly and with proamatine, tolerating showers, post fatigue mitigated by taking at night   Neurologically stable   Still on dig         DATE TEST EF    11/18 Echo   55-60 %    2/22 TEE      4/23 Myoview  80% No Ischemia "Attenuation images-no evidence of coronary calcifications  6/23 Echo  60-65%      Date Cr K Hgb DIG  9/22 1.01 4.4 15.0   1.9 (12/22)                     Past Medical History:  Diagnosis Date   Anemia    Anxiety    Depression    Dyspnea    SINCE STROKE   GERD (gastroesophageal reflux disease)    Hyperlipidemia    Hypertension    IBS (irritable bowel syndrome)    Migraine    Previous cesarean delivery, delivered, with or without mention of antepartum condition 01/19/2012   Psychogenic nonepileptic seizure    hx/notes 12/19/2016-LAST SEIZURE IN 2019-ON NO MEDS AS OF 05-24-19    Restless leg syndrome    Sleep apnea    USES CPAP   Stroke (HCC) 12/18/2016   Acute arterial ischemic stroke, multifocal, posterior circulation /notes 12/19/2016-LEFT SIDED WEAKNESS, MEMORY FATIGUE AND TROUBLE FINDING HER WORDS [facility]    Past Surgical History:  Procedure Laterality Date   ABDOMINOPLASTY  Feb. 2015   CESAREAN SECTION  2011   CHOLECYSTECTOMY N/A 05/31/2019   Procedure: LAPAROSCOPIC CHOLECYSTECTOMY WITH INTRAOPERATIVE CHOLANGIOGRAM;  Surgeon: Earline Mayotte, MD;  Location: [facility] ORS;  Service: General;  Laterality: N/A;   COLONOSCOPY WITH PROPOFOL N/A 11/26/2016   Procedure: COLONOSCOPY WITH PROPOFOL;  Surgeon: Wyline Mood, MD;  Location: [facility];  Service: Gastroenterology;  Laterality: N/A;   COLONOSCOPY WITH PROPOFOL N/A 08/08/2020   Procedure: COLONOSCOPY WITH PROPOFOL;  Surgeon: Regis Bill, MD;  Location: [facility] ENDOSCOPY;  Service: Endoscopy;  Laterality: N/A;   CYSTOSCOPY  02/02/2015   Procedure: CYSTOSCOPY;  Surgeon: Nadara Mustard, MD;  Location: [facility];  Service: Gynecology;;   DILATION AND CURETTAGE OF UTERUS  2003, 2005, 2008   ESOPHAGOGASTRODUODENOSCOPY N/A 05/31/2019   Procedure: ESOPHAGOGASTRODUODENOSCOPY (EGD);  Surgeon: Earline Mayotte, MD;  Location: [facility];  Service: General;  Laterality: N/A;  in the O.R.   ESOPHAGOGASTRODUODENOSCOPY (EGD) WITH PROPOFOL N/A 11/26/2016   Procedure: ESOPHAGOGASTRODUODENOSCOPY (EGD)  WITH PROPOFOL;  Surgeon: Jonathon Bellows, MD;  Location: [facility] ENDOSCOPY;  Service: Gastroenterology;  Laterality: N/A;   HERNIA REPAIR  [id number]   Umbilical   KNEE ARTHROSCOPY Right 2006   LAPAROSCOPIC BILATERAL SALPINGECTOMY Bilateral 02/02/2015   Procedure: LAPAROSCOPIC BILATERAL SALPINGECTOMY;  Surgeon: Gae Dry, MD;  Location: [facility];  Service: Gynecology;  Laterality: Bilateral;   LAPAROSCOPIC HYSTERECTOMY N/A 02/02/2015   Procedure: HYSTERECTOMY TOTAL LAPAROSCOPIC;  Surgeon: Gae Dry, MD;  Location: [facility];   Service: Gynecology;  Laterality: N/A;   LOOP RECORDER INSERTION N/A 12/20/2016   Procedure: LOOP RECORDER INSERTION;  Surgeon: Constance Haw, MD;  Location: [facility];  Service: Cardiovascular;  Laterality: N/A;   REDUCTION MAMMAPLASTY Bilateral December 2015   TEE WITHOUT CARDIOVERSION N/A 12/20/2016   Procedure: TRANSESOPHAGEAL ECHOCARDIOGRAM (TEE);  Surgeon: Jubilee Spark, MD;  Location: [place];  Service: Cardiovascular;  Laterality: N/A;   TEE WITHOUT CARDIOVERSION N/A 03/29/2020   Procedure: TRANSESOPHAGEAL ECHOCARDIOGRAM (TEE);  Surgeon: Kate Sable, MD;  Location: [facility];  Service: Cardiovascular;  Laterality: N/A;    Current Outpatient Medications  Medication Sig Dispense Refill   ALPRAZolam (XANAX XR) 1 MG 24 hr tablet Take 1 tablet (1 mg total) by mouth every morning. 90 tablet 0   clopidogrel (PLAVIX) 75 MG tablet Take 75 mg by mouth daily.     cyclobenzaprine (FLEXERIL) 10 MG tablet TAKE 1 TABLET(10 MG) BY MOUTH AT BEDTIME 90 tablet 1   digoxin (LANOXIN) 0.125 MG tablet Take 1 tablet (0.125 mg total) by mouth daily. 90 tablet 0   fluticasone furoate-vilanterol (BREO ELLIPTA) 100-25 MCG/ACT AEPB INHALE 1 PUFF INTO THE LUNGS DAILY 60 each 0   gabapentin (NEURONTIN) 100 MG capsule Take 100 mg by mouth 3 (three) times daily.     hydrocortisone 2.5 % cream Apply topically.     lamoTRIgine (LAMICTAL) 100 MG tablet Take 100 mg by mouth 2 (two) times daily.     midodrine (PROAMATINE) 5 MG tablet Take 1 tablet (5 mg) by mouth three times a day at 7 am, 11 am, & 3 pm 270 tablet 0   PARoxetine (PAXIL-CR) 25 MG 24 hr tablet TAKE 2 TABLET BY MOUTH DAILY 180 tablet 1   UBRELVY 100 MG TABS Take 100 mg by mouth daily as needed (migraine).      Vitamin D, Ergocalciferol, (DRISDOL) 1.25 MG (50000 UNIT) CAPS capsule TAKE 1 CAPSULE BY MOUTH EVERY 7 DAYS 12 capsule 1   zolpidem (AMBIEN CR) 12.5 MG CR tablet Take 1 tablet (12.5 mg total) by mouth at bedtime as needed  for sleep. 90 tablet 0   No current facility-administered medications for this visit.    Allergies:   Azithromycin and Penicillins     ROS:  Please see the history of present illness.   All other systems are personally reviewed and negative.    Exam:    Vital Signs:  Pulse 99   Ht 5\' 8"  (1.727 m)   Wt 189 lb (85.7 kg)   LMP 01/15/2015   SpO2 95%   BMI 28.74 kg/m     Labs/Other Tests and Data Reviewed:    Recent Labs: 08/29/2021: ALT 18 08/30/2021: Hemoglobin 12.6; Platelets 235 08/31/2021: BUN 20; Creatinine, Ser 1.00; Magnesium 2.3; Potassium 3.8; Sodium 140   Wt Readings from Last 3 Encounters:  03/07/22 189 lb (85.7 kg)  11/30/21 191 lb 9.6 oz (86.9 kg)  11/09/21 192 lb 12.8 oz (87.5 kg)     Other  studies personally reviewed: Additional studies/ records that were reviewed today include: (As above)   Review of the above records today demonstrates: (As above)          ASSESSMENT & PLAN:   Cryptogenic stroke   Aortic septal aneurysm without PFO   Dysautonomia-- inappropriate sinus tachycardia/orthostatic intolerance   PVCs  Will stop dig  Continue PVC  discussed flecainide but will hold off on starting now   Dizziness on proamantine is much improved        COVID 19 screen The patient denies symptoms of COVID 19 at this time.  The importance of social distancing was discussed today.  Follow-up:  7m Next remote: NA  Current medicines are reviewed at length with the patient today.   The patient does not have concerns regarding her medicines.  The following changes were made today:  stop digoxin  Labs/ tests ordered today include: follow No orders of the defined types were placed in this encounter.     Patient Risk:  after full review of this patients clinical status, I feel that they are at moderate risk at this time.  Today, I have spent 21 minutes with the patient with telehealth technology discussing (As above) and chart review .     Signed, Virl Axe, MD  03/07/2022 11:07 AM     [place] [address] [place] [id number] [phone] (office) [phone] (fax)

## 2022-03-12 ENCOUNTER — Encounter: Payer: Self-pay | Admitting: Cardiology

## 2022-03-14 DIAGNOSIS — M5416 Radiculopathy, lumbar region: Secondary | ICD-10-CM | POA: Diagnosis not present

## 2022-03-14 DIAGNOSIS — M47816 Spondylosis without myelopathy or radiculopathy, lumbar region: Secondary | ICD-10-CM | POA: Diagnosis not present

## 2022-03-14 DIAGNOSIS — M5126 Other intervertebral disc displacement, lumbar region: Secondary | ICD-10-CM | POA: Diagnosis not present

## 2022-03-14 DIAGNOSIS — M5136 Other intervertebral disc degeneration, lumbar region: Secondary | ICD-10-CM | POA: Diagnosis not present

## 2022-03-29 ENCOUNTER — Encounter: Payer: Self-pay | Admitting: Internal Medicine

## 2022-04-01 ENCOUNTER — Other Ambulatory Visit: Payer: Self-pay

## 2022-04-01 MED ORDER — SODIUM CHLORIDE 1 G PO TABS: 1.0000 g | ORAL_TABLET | Freq: Three times a day (TID) | ORAL | 3 refills | Status: DC

## 2022-04-02 NOTE — Progress Notes (Unsigned)
Name: Alicia [name] Note Sholl   MRN: [id number]    DOB: [birth year]   Date:04/03/2022       Progress Note  Subjective  Chief Complaint  Follow Up  HPI  CVA with hemiparesis : 12/18/2016, admitted to [facility] , mild distal left PICA. She is feeling better, but still has lack of balance but not using a cane, episodes of dysarthria, continues to have tremors, and has some left side weakness .  She still has some short term memory difficulties and difficulty finding the correct words  to express herself at times.  She had at least 4 seizure like activities first one in 2018 ,  09/2017, had one in April after surgery 05/2019, she states had a similar episode Fall 2022 .She states when at work and super tired she falls asleep on her desk. She feels very sleepy and lethargic and has to put her head down, it can last 8-9 minutes and when she wakes up she is able to function. At times she has episodes of nausea, gets confused and has to rest She  is taking Crestor. She recently went to [facility] Neurology - Dr. Claris Gladden  02/2021 , had repeat MRI and advised to stop aspirin and start Plavix  since studies showed not responsive to aspirin, there is still a concern for mild FMD in her cervical ICA and vertebral arteries. Possible PFO but cardiac MRI negative for cardio embolic source. She had another episode of confusion and left side weakness on 07/23  but CTA, MRI and CT all negative except for old lacunar infarct - she states still feeling weak on left side and some dysarthria , she has mental fogginess, she has to work from home a couple times a week to help her stay focuses and takes breaks when overwhelmed   Summary of transcranial doppler 05/30/2020 No HITS at rest or during Valsalva. Negative transcranial Doppler Bubble study with no evidence of right to left intracardiac communication. A vascular evaluation was performed. The right middle cerebral artery was studied. An IV was inserted into the patient's  left antecubital. Verbal informed consent was obtained. Negative TCD Bubble study  Cardiac MRI heart: 12/11/2020   Normal EF, unremarkable  Migraine : She is under the care of Dr. Manuella Ghazi. Currently on Lamictal and also prn Ubrelvy, episodes are up again once or twice a week again, states worries about what is going on in the world. She misses work about 4 hours q 2 weeks due to migraine headaches. She states described as a band around her head and associated with phonophobia and photophobia   Orthostatic hypotension with tachycardia/Dysautonomia :   and had  TEE for evaluation of TIA's and strokes to rule out foramen ovale - so far no structural heart disease found. She is off digoxin but still taking midrodine , she states doing better now. No longer having the blank out episodes as often   Long haul COVID-19: diagnosed 02/04/2020, she continues to have fatigue and SOB with activity . She already had some brain fogginess before COVID-19 but feels worse since COVID , she also has intermittent chest tightness COVID-19 , she had a second episode of COVID-05 Feb 2021. She feels like myalgia and fatigues , she feels like muscle aches and joint pains are getting worse. Seeing psyatrist Discussed PMR but she wants to be seen locally    GAD/MDD : Husband died suddenly of a brain aneurysm back in  October 23 rd, 2016.  She has been  taking her antidepressant medication and Alprazolam as prescribed.  She remarried her high school sweet heart 06/07/2016 ( and he moved here from [place])  Had a stroke 12/2016. We adjusted Paxil  dose from 40 mg to 60 mg Fall of 2020 and symptoms were controlled, we switched sustained release 37.5  but she noticed it was not working as well so she added 20 mg immediate release. Insurance denied paying for two separate formulations and is currently taking two  25 CR doses but feels like it is not enough. Currently on 50 mg and symptoms are stable.   She has more meetings and feels  overwhelmed by being around a lot of people She is still avoiding crowed placed, work is excusing her from larger meetings (more than 6 people) . She states now very anxious about her overall health . She has syncopal episodes when around a large group of people   Hypertriglyceridemia: currently off Vascepa due to cost and nausea, she also stopped taking Crestor but states it was causing her headaches and muscle pain. LDL during hospital stay was up to 86, HDL 42 , triglycerides was up . She is taking otc fish oil only    Pre-diabetes : last A1C was 6 % back in July 2023  She denies polyphagia, polydipsia or polyuria.  Insomnia: taking Ambien and is taking her less than 30 minutes to fall asleep .She is aware of long term risk of Ambien, but states cannot sleep without it. She also knows that the dose she is taking is not FDA approved for females. She states medication is working now, when tired enough able to fall and stay asleep. We tried going down on flexeril because of compounding sedative effect, but neck pain and headaches got worse, she was unable to go down on Flexeril but lower dose did not work for neck spasms. She states she has a good bedtime routine, stops screen 30 minutes before time and chats with her husband before bed. She is doing well     Neck and back pain: currently seeing psychiatrist , taking Flexeril and not able to see PT at this time due to cost. She had a positive HLA B27. She is now also taking gabapentin 100 mg in am, at lunch and 300 mg at night. She states medication seems to help with pain  Thyroid nodule:  also has low T3 but normal TSH  IMPRESSION: 12/2019  1. Mildly heterogeneous but otherwise unremarkable thyroid gland without evidence of nodule. 2. The palpable abnormality corresponds with a small 0.6 cm lymph node. Favor reactive lymphadenopathy. Recommend continued clinical surveillance. If there is evidence of growth over time, repeat imaging and potentially  biopsy may become warranted.  GERD:she had an esophogram, barium swallow done on 05/19/2019 that showed mild GERD, she was able to stop PPI and is doing well, occasionally has symptoms at night   OSA: wearing CPAP every night now  She is compliant    Reactive Airway Dysfunction syndrome: she saw Vita Barley, pulmonologist, advised to stop Symbicort , take prn albuterol and is now on Breo . She had COVID Dec 2021 and again Dec 2022 - she continues to have intermittent SOB, usually triggered by activity   Leukocytoclastic vasculitis: based on skin biopsy done by Dr. Nehemiah Massed, multiple labs done and negative, she saw  Dr. Meda Coffee in the past and is under the care of Dr. Posey Pronto now, first visit was today   Patient Active Problem List   Diagnosis Date  Noted   Essential hypertension 08/30/2021   Acute CVA (cerebrovascular accident) ([place]) 08/29/2021   Cerebrovascular accident (CVA) ([place])    COVID-19 long hauler manifesting chronic fatigue 03/21/2020   Orthostatic hypotension 02/23/2020   Atypical chest pain 02/23/2020   Pneumonia due to COVID-19 virus 02/07/2020   Reactive airways dysfunction syndrome ([place]) 10/15/2019   Hyperfunction of pituitary gland, unspecified ([place]) 10/15/2019   Calculus of gallbladder without cholecystitis without obstruction 05/06/2019   Cyst of pancreas 05/06/2019   Right kidney stone 05/06/2019   Leukocytoclastic vasculitis ([place]) 10/07/2018   Chronic insomnia 02/09/2018   History of anemia 02/09/2018   Sleep apnea 09/24/2017   History of ischemic stroke 05/30/2017   Hyperlipidemia 05/30/2017   Migraine without aura and without status migrainosus, not intractable 05/27/2017   Pain in right knee 01/21/2017   Psychogenic nonepileptic seizure    Chronic pain syndrome    Restless leg syndrome    Chronic prescription benzodiazepine use 01/20/2016   Leukocytosis 01/20/2016   Seizure-like activity (HCC)    GAD (generalized anxiety disorder) 02/14/2015   Chronic neck  pain 02/14/2015   History of hysterectomy 02/14/2015   Iron deficiency anemia due to chronic blood loss 08/29/2014   Insomnia, persistent 08/14/2014   Major depression ([place]) 08/14/2014   Temporomandibular joint sounds on opening and/or closing the jaw 08/14/2014   Degenerative disc disease, lumbar 08/14/2014   Bleeding internal hemorrhoids 08/14/2014   Gastric reflux 08/14/2014   Irritable bowel syndrome with constipation 08/14/2014   Hypertriglyceridemia 08/14/2014   Overweight 08/14/2014   Tinnitus 08/14/2014   Vitamin D deficiency 08/14/2014   Sinus tachycardia 11/25/2012   DOE (dyspnea on exertion) 11/06/2012    Past Surgical History:  Procedure Laterality Date   ABDOMINOPLASTY  Feb. 2015   CESAREAN SECTION  2011   CHOLECYSTECTOMY N/A 05/31/2019   Procedure: LAPAROSCOPIC CHOLECYSTECTOMY WITH INTRAOPERATIVE CHOLANGIOGRAM;  Surgeon: Robert Bellow, MD;  Location: [facility] ORS;  Service: General;  Laterality: N/A;   COLONOSCOPY WITH PROPOFOL N/A 11/26/2016   Procedure: COLONOSCOPY WITH PROPOFOL;  Surgeon: Jonathon Bellows, MD;  Location: [facility] ENDOSCOPY;  Service: Gastroenterology;  Laterality: N/A;   COLONOSCOPY WITH PROPOFOL N/A 08/08/2020   Procedure: COLONOSCOPY WITH PROPOFOL;  Surgeon: Lesly Rubenstein, MD;  Location: [facility] ENDOSCOPY;  Service: Endoscopy;  Laterality: N/A;   CYSTOSCOPY  02/02/2015   Procedure: CYSTOSCOPY;  Surgeon: Gae Dry, MD;  Location: [facility] ORS;  Service: Gynecology;;   DILATION AND CURETTAGE OF UTERUS  2003, 2005, 2008   ESOPHAGOGASTRODUODENOSCOPY N/A 05/31/2019   Procedure: ESOPHAGOGASTRODUODENOSCOPY (EGD);  Surgeon: Robert Bellow, MD;  Location: [facility] ORS;  Service: General;  Laterality: N/A;  in the O.R.   ESOPHAGOGASTRODUODENOSCOPY (EGD) WITH PROPOFOL N/A 11/26/2016   Procedure: ESOPHAGOGASTRODUODENOSCOPY (EGD) WITH PROPOFOL;  Surgeon: Jonathon Bellows, MD;  Location: [facility] ENDOSCOPY;  Service: Gastroenterology;  Laterality: N/A;   HERNIA REPAIR  [id number]    Umbilical   KNEE ARTHROSCOPY Right 2006   LAPAROSCOPIC BILATERAL SALPINGECTOMY Bilateral 02/02/2015   Procedure: LAPAROSCOPIC BILATERAL SALPINGECTOMY;  Surgeon: Gae Dry, MD;  Location: [facility] ORS;  Service: Gynecology;  Laterality: Bilateral;   LAPAROSCOPIC HYSTERECTOMY N/A 02/02/2015   Procedure: HYSTERECTOMY TOTAL LAPAROSCOPIC;  Surgeon: Gae Dry, MD;  Location: [facility] ORS;  Service: Gynecology;  Laterality: N/A;   LOOP RECORDER INSERTION N/A 12/20/2016   Procedure: LOOP RECORDER INSERTION;  Surgeon: Constance Haw, MD;  Location: [place] [facility];  Service: Cardiovascular;  Laterality: N/A;   REDUCTION MAMMAPLASTY Bilateral December 2015   TEE  WITHOUT CARDIOVERSION N/A 12/20/2016   Procedure: TRANSESOPHAGEAL ECHOCARDIOGRAM (TEE);  Surgeon: Lyndsi Spark, MD;  Location: [place];  Service: Cardiovascular;  Laterality: N/A;   TEE WITHOUT CARDIOVERSION N/A 03/29/2020   Procedure: TRANSESOPHAGEAL ECHOCARDIOGRAM (TEE);  Surgeon: Kate Sable, MD;  Location: [facility] ORS;  Service: Cardiovascular;  Laterality: N/A;    Family History  Problem Relation Age of Onset   Hypertension Mother    Hyperlipidemia Mother    Heart Problems Father        hole in heart and lower ventricles reversed   Prostate cancer Maternal Grandfather    Von Willebrand disease Maternal Uncle     Social History   Tobacco Use   Smoking status: Never   Smokeless tobacco: Never  Substance Use Topics   Alcohol use: No    Alcohol/week: 0.0 standard drinks of alcohol     Current Outpatient Medications:    ALPRAZolam (XANAX XR) 1 MG 24 hr tablet, Take 1 tablet (1 mg total) by mouth every morning., Disp: 90 tablet, Rfl: 0   clopidogrel (PLAVIX) 75 MG tablet, Take 75 mg by mouth daily., Disp: , Rfl:    cyclobenzaprine (FLEXERIL) 10 MG tablet, TAKE 1 TABLET(10 MG) BY MOUTH AT BEDTIME, Disp: 90 tablet, Rfl: 1   fluticasone furoate-vilanterol (BREO ELLIPTA) 100-25 MCG/ACT AEPB, INHALE 1 PUFF INTO  THE LUNGS DAILY, Disp: 60 each, Rfl: 0   gabapentin (NEURONTIN) 100 MG capsule, Take 100 mg by mouth 3 (three) times daily., Disp: , Rfl:    hydrocortisone 2.5 % cream, Apply topically., Disp: , Rfl:    lamoTRIgine (LAMICTAL) 100 MG tablet, Take 100 mg by mouth 2 (two) times daily., Disp: , Rfl:    midodrine (PROAMATINE) 5 MG tablet, Take 1 tablet (5 mg) by mouth three times a day at 7 am, 11 am, & 3 pm, Disp: 270 tablet, Rfl: 0   PARoxetine (PAXIL-CR) 25 MG 24 hr tablet, TAKE 2 TABLET BY MOUTH DAILY, Disp: 180 tablet, Rfl: 1   sodium chloride 1 g tablet, Take 1 tablet (1 g total) by mouth 3 (three) times daily with meals., Disp: 270 tablet, Rfl: 3   UBRELVY 100 MG TABS, Take 100 mg by mouth daily as needed (migraine). , Disp: , Rfl:    Vitamin D, Ergocalciferol, (DRISDOL) 1.25 MG (50000 UNIT) CAPS capsule, TAKE 1 CAPSULE BY MOUTH EVERY 7 DAYS, Disp: 12 capsule, Rfl: 1   zolpidem (AMBIEN CR) 12.5 MG CR tablet, Take 1 tablet (12.5 mg total) by mouth at bedtime as needed for sleep., Disp: 90 tablet, Rfl: 0  Allergies  Allergen Reactions   Azithromycin Diarrhea   Penicillins Hives and Swelling    Has patient had a PCN reaction causing immediate rash, facial/tongue/throat swelling, SOB or lightheadedness with hypotension: Yes Has patient had a PCN reaction causing severe rash involving mucus membranes or skin necrosis: No Has patient had a PCN reaction that required hospitalization No Has patient had a PCN reaction occurring within the last 10 years: No If all of the above answers are "NO", then may proceed with Cephalosporin use.    I personally reviewed active problem list, medication list, allergies, family history, social history, health maintenance with the patient/caregiver today.   ROS  Constitutional: Negative for fever or weight change.  Respiratory: Negative for cough and shortness of breath.   Cardiovascular: Negative for chest pain or palpitations.  Gastrointestinal: Negative  for abdominal pain, no bowel changes.  Musculoskeletal: Negative for gait problem or joint swelling.  Skin:  Negative for rash.  Neurological: Negative for dizziness or headache.  No other specific complaints in a complete review of systems (except as listed in HPI above).   Objective  Vitals:   04/03/22 0940  BP: 126/74  Pulse: 99  Resp: 16  Temp: 98 F (36.7 C)  TempSrc: Oral  SpO2: 99%  Weight: 197 lb (89.4 kg)  Height: 5' 7.25" (1.708 m)    Body mass index is 30.63 kg/m.  Physical Exam  Constitutional: Patient appears well-developed and well-nourished. Obese  No distress.  HEENT: head atraumatic, normocephalic, pupils equal and reactive to light, neck supple Cardiovascular: Normal rate, regular rhythm and normal heart sounds.  No murmur heard. No BLE edema. Pulmonary/Chest: Effort normal and breath sounds normal. No respiratory distress. Abdominal: Soft.  There is no tenderness. Psychiatric: Patient has a normal mood and affect. behavior is normal. Judgment and thought content normal.   PHQ2/9:    04/03/2022    9:42 AM 11/30/2021    8:19 AM 09/13/2021    9:43 AM 09/06/2021    8:17 AM 05/22/2021    8:19 AM  Depression screen PHQ 2/9  Decreased Interest 1 0 1 1 0  Down, Depressed, Hopeless 1 0 1 1 0  PHQ - 2 Score 2 0 2 2 0  Altered sleeping 1 0 1 1 0  Tired, decreased energy 1 0 1 1 0  Change in appetite 1 0 3 3 0  Feeling bad or failure about yourself  0 0 0 1 0  Trouble concentrating 0 0 0 1 0  Moving slowly or fidgety/restless 0 0 0 0 0  Suicidal thoughts 0 0 1 0 0  PHQ-9 Score 5 0 8 9 0  Difficult doing work/chores Somewhat difficult  Somewhat difficult Very difficult     phq 9 is negative   Fall Risk:    04/03/2022    9:42 AM 11/30/2021    8:19 AM 09/13/2021    9:42 AM 09/06/2021    8:17 AM 05/22/2021    8:19 AM  Fall Risk   Falls in the past year? 1 1 1 1 1  $ Number falls in past yr: 1 1 1 1 1  $ Injury with Fall? 0 1 1 1 1  $ Risk for fall due to :  History of fall(s) History of fall(s);Mental status change Impaired balance/gait;History of fall(s) History of fall(s);Impaired balance/gait No Fall Risks  Follow up Falls prevention discussed;Education provided;Falls evaluation completed Education provided;Falls evaluation completed;Falls prevention discussed Education provided;Falls prevention discussed Falls evaluation completed Falls prevention discussed     Assessment & Plan  1. Dysautonomia [facility])  Seeing cardiologist, seems to be doing better   2. Hemiparesis of left nondominant side as late effect of cerebral infarction (HCC)  Stable   3. Mild episode of recurrent major depressive disorder (HCC)  - PARoxetine (PAXIL-CR) 25 MG 24 hr tablet; TAKE 2 TABLET BY MOUTH DAILY  Dispense: 180 tablet; Refill: 1  4. Leukocytoclastic vasculitis ([place])  Under the care of dermatologist and saw Rheumatologist this morning   5. Seizure-like activity (HCC)  No recent episodes, sees Dr. Manuella Ghazi  6. Reactive airways dysfunction syndrome (HCC)  - fluticasone furoate-vilanterol (BREO ELLIPTA) 100-25 MCG/ACT AEPB; Inhale 1 puff into the lungs daily.  Dispense: 180 each; Refill: 1  7. HLA B27 (HLA B27 positive)   8. GAD (generalized anxiety disorder)  - ALPRAZolam (XANAX XR) 1 MG 24 hr tablet; Take 1 tablet (1 mg total) by mouth every morning.  Dispense:  90 tablet; Refill: 0 - PARoxetine (PAXIL-CR) 25 MG 24 hr tablet; TAKE 2 TABLET BY MOUTH DAILY  Dispense: 180 tablet; Refill: 1  9. Chronic insomnia  - zolpidem (AMBIEN CR) 12.5 MG CR tablet; Take 1 tablet (12.5 mg total) by mouth at bedtime as needed for sleep.  Dispense: 90 tablet; Refill: 0

## 2022-04-03 ENCOUNTER — Ambulatory Visit: Payer: BC Managed Care – PPO | Admitting: Family Medicine

## 2022-04-03 ENCOUNTER — Encounter: Payer: Self-pay | Admitting: Family Medicine

## 2022-04-03 VITALS — BP 126/74 | HR 99 | Temp 98.0°F | Resp 16 | Ht 67.25 in | Wt 197.0 lb

## 2022-04-03 DIAGNOSIS — J683 Other acute and subacute respiratory conditions due to chemicals, gases, fumes and vapors: Secondary | ICD-10-CM

## 2022-04-03 DIAGNOSIS — F5104 Psychophysiologic insomnia: Secondary | ICD-10-CM

## 2022-04-03 DIAGNOSIS — M2550 Pain in unspecified joint: Secondary | ICD-10-CM | POA: Diagnosis not present

## 2022-04-03 DIAGNOSIS — I69354 Hemiplegia and hemiparesis following cerebral infarction affecting left non-dominant side: Secondary | ICD-10-CM | POA: Diagnosis not present

## 2022-04-03 DIAGNOSIS — R5382 Chronic fatigue, unspecified: Secondary | ICD-10-CM | POA: Diagnosis not present

## 2022-04-03 DIAGNOSIS — Z1589 Genetic susceptibility to other disease: Secondary | ICD-10-CM | POA: Diagnosis not present

## 2022-04-03 DIAGNOSIS — E538 Deficiency of other specified B group vitamins: Secondary | ICD-10-CM | POA: Diagnosis not present

## 2022-04-03 DIAGNOSIS — G901 Familial dysautonomia [Riley-Day]: Secondary | ICD-10-CM

## 2022-04-03 DIAGNOSIS — F330 Major depressive disorder, recurrent, mild: Secondary | ICD-10-CM

## 2022-04-03 DIAGNOSIS — M533 Sacrococcygeal disorders, not elsewhere classified: Secondary | ICD-10-CM | POA: Diagnosis not present

## 2022-04-03 DIAGNOSIS — M310 Hypersensitivity angiitis: Secondary | ICD-10-CM

## 2022-04-03 DIAGNOSIS — M130 Polyarthritis, unspecified: Secondary | ICD-10-CM | POA: Diagnosis not present

## 2022-04-03 DIAGNOSIS — R569 Unspecified convulsions: Secondary | ICD-10-CM

## 2022-04-03 DIAGNOSIS — M791 Myalgia, unspecified site: Secondary | ICD-10-CM | POA: Diagnosis not present

## 2022-04-03 DIAGNOSIS — F411 Generalized anxiety disorder: Secondary | ICD-10-CM

## 2022-04-03 MED ORDER — FLUTICASONE FUROATE-VILANTEROL 100-25 MCG/ACT IN AEPB: 1.0000 | INHALATION_SPRAY | Freq: Every day | RESPIRATORY_TRACT | 1 refills | Status: DC

## 2022-04-03 MED ORDER — ALPRAZOLAM ER 1 MG PO TB24: 1.0000 mg | ORAL_TABLET | ORAL | 0 refills | Status: DC

## 2022-04-03 MED ORDER — ZOLPIDEM TARTRATE ER 12.5 MG PO TBCR: 12.5000 mg | EXTENDED_RELEASE_TABLET | Freq: Every evening | ORAL | 0 refills | Status: DC | PRN

## 2022-04-03 MED ORDER — PAROXETINE HCL ER 25 MG PO TB24
ORAL_TABLET | ORAL | 1 refills | Status: DC
Start: 2022-04-03 — End: 2022-11-01

## 2022-04-05 ENCOUNTER — Other Ambulatory Visit: Payer: Self-pay

## 2022-04-05 DIAGNOSIS — J683 Other acute and subacute respiratory conditions due to chemicals, gases, fumes and vapors: Secondary | ICD-10-CM

## 2022-05-07 ENCOUNTER — Other Ambulatory Visit: Payer: Self-pay | Admitting: Family Medicine

## 2022-05-07 ENCOUNTER — Ambulatory Visit (INDEPENDENT_AMBULATORY_CARE_PROVIDER_SITE_OTHER): Payer: BC Managed Care – PPO

## 2022-05-07 DIAGNOSIS — E538 Deficiency of other specified B group vitamins: Secondary | ICD-10-CM

## 2022-05-07 DIAGNOSIS — G8929 Other chronic pain: Secondary | ICD-10-CM

## 2022-05-07 DIAGNOSIS — Z23 Encounter for immunization: Secondary | ICD-10-CM | POA: Diagnosis not present

## 2022-05-07 MED ORDER — CYANOCOBALAMIN 1000 MCG/ML IJ SOLN
1000.0000 ug | Freq: Once | INTRAMUSCULAR | Status: AC
  Administered 2022-05-07: 1000 ug via INTRAMUSCULAR

## 2022-05-07 NOTE — Progress Notes (Signed)
Patient presented in reported good health to receive a B-12 injection as well as a Tdap immunization. Patient tolerated both injections well with no immediate side effects noted. Patient was pleasant, verbalized understanding of information sheets given on vaccine and had no questions or concerns to express at time of clinic departure.

## 2022-05-22 DIAGNOSIS — M5030 Other cervical disc degeneration, unspecified cervical region: Secondary | ICD-10-CM | POA: Diagnosis not present

## 2022-05-22 DIAGNOSIS — M5136 Other intervertebral disc degeneration, lumbar region: Secondary | ICD-10-CM | POA: Diagnosis not present

## 2022-05-22 DIAGNOSIS — M5126 Other intervertebral disc displacement, lumbar region: Secondary | ICD-10-CM | POA: Diagnosis not present

## 2022-05-22 DIAGNOSIS — M5416 Radiculopathy, lumbar region: Secondary | ICD-10-CM | POA: Diagnosis not present

## 2022-06-04 DIAGNOSIS — Z79899 Other long term (current) drug therapy: Secondary | ICD-10-CM | POA: Diagnosis not present

## 2022-06-04 DIAGNOSIS — Z8673 Personal history of transient ischemic attack (TIA), and cerebral infarction without residual deficits: Secondary | ICD-10-CM | POA: Diagnosis not present

## 2022-06-04 DIAGNOSIS — R202 Paresthesia of skin: Secondary | ICD-10-CM | POA: Diagnosis not present

## 2022-06-04 DIAGNOSIS — R404 Transient alteration of awareness: Secondary | ICD-10-CM | POA: Diagnosis not present

## 2022-06-05 ENCOUNTER — Telehealth: Payer: BC Managed Care – PPO | Admitting: Nurse Practitioner

## 2022-06-05 ENCOUNTER — Telehealth: Payer: BC Managed Care – PPO

## 2022-06-05 DIAGNOSIS — M7989 Other specified soft tissue disorders: Secondary | ICD-10-CM

## 2022-06-05 DIAGNOSIS — R0602 Shortness of breath: Secondary | ICD-10-CM

## 2022-06-05 DIAGNOSIS — R635 Abnormal weight gain: Secondary | ICD-10-CM

## 2022-06-05 NOTE — Progress Notes (Signed)
Virtual Visit Consent   Alicia Alicia [name] Note Alicia [name], you are scheduled for a virtual visit with a Alicia Alicia [name] provider today. Just as with appointments in the office, your consent must be obtained to participate. Your consent will be active for this visit and any virtual visit you may have with one of our providers in the next 365 days. If you have a MyChart account, a copy of this consent can be sent to you electronically.  As this is a virtual visit, video technology does not allow for your provider to perform a traditional examination. This may limit your provider's ability to fully assess your condition. If your provider identifies any concerns that need to be evaluated in person or the need to arrange testing (such as labs, EKG, etc.), we will make arrangements to do so. Although advances in technology are sophisticated, we cannot ensure that it will always work on either your end or our end. If the connection with a video visit is poor, the visit may have to be switched to a telephone visit. With either a video or telephone visit, we are not always able to ensure that we have a secure connection.  By engaging in this virtual visit, you consent to the provision of healthcare and authorize for your insurance to be billed (if applicable) for the services provided during this visit. Depending on your insurance coverage, you may receive a charge related to this service.  I need to obtain your verbal consent now. Are you willing to proceed with your visit today? Alicia Alicia [name] Note Alicia Alicia [name] has provided verbal consent on 06/05/2022 for a virtual visit (video or telephone). Alicia Simas, FNP  Date: 06/05/2022 4:06 PM  Virtual Visit via Video Note   I, Alicia Alicia [name], connected with  Alicia Alicia [name] Note Alicia [name]  ([id number], 1970-08-13) on 06/05/22 at  4:15 PM EDT by a video-enabled telemedicine application and verified that I am speaking with the correct person using two identifiers.  Location: Patient: Virtual Visit  Location Patient: Home Provider: Virtual Visit Location Provider: Home Office   I discussed the limitations of evaluation and management by telemedicine and the availability of in person appointments. The patient expressed understanding and agreed to proceed.    History of Present Illness: Alicia Alicia [name] Note Alicia [name] is a 52 y.o. who identifies as a female who was assigned female at birth, and is being seen today with complaints of:  20lb weight gain in one week Bilateral leg swelling  Chest pressure  "Stinging" pain between shoulder blades  Right should pain  SOB  Intermittent symptoms of being clammy   Symptom onset was 05/27/22 Denies new medications in the past month   No recent illness   Followed by cardiology/Most recent appointment was in February  History as below:  Problems:  Patient Active Problem List   Diagnosis Date Noted   Dysautonomia 04/03/2022   Hemiparesis of left nondominant side as late effect of cerebral infarction 04/03/2022   Mild episode of recurrent major depressive disorder 04/03/2022   HLA B27 (HLA B27 positive) 04/03/2022   Essential hypertension 08/30/2021   COVID-19 long hauler manifesting chronic fatigue 03/21/2020   Orthostatic hypotension 02/23/2020   Atypical chest pain 02/23/2020   Reactive airways dysfunction syndrome 10/15/2019   Hyperfunction of pituitary gland, unspecified 10/15/2019   Calculus of gallbladder without cholecystitis without obstruction 05/06/2019   Cyst of pancreas 05/06/2019   Right kidney stone 05/06/2019   Leukocytoclastic vasculitis 10/07/2018   Chronic insomnia 02/09/2018   History of anemia  02/09/2018   Sleep apnea 09/24/2017   History of ischemic stroke 05/30/2017   Hyperlipidemia 05/30/2017   Migraine without aura and without status migrainosus, not intractable 05/27/2017   Pain in right knee 01/21/2017   Psychogenic nonepileptic seizure    Chronic pain syndrome    Restless leg syndrome    Chronic prescription  benzodiazepine use 01/20/2016   Leukocytosis 01/20/2016   Seizure-like activity    GAD (generalized anxiety disorder) 02/14/2015   Chronic neck pain 02/14/2015   History of hysterectomy 02/14/2015   Iron deficiency anemia due to chronic blood loss 08/29/2014   Insomnia, persistent 08/14/2014   Major depression (HCC) 08/14/2014   Temporomandibular joint sounds on opening and/or closing the jaw 08/14/2014   Degenerative disc disease, lumbar 08/14/2014   Bleeding internal hemorrhoids 08/14/2014   Gastric reflux 08/14/2014   Irritable bowel syndrome with constipation 08/14/2014   Hypertriglyceridemia 08/14/2014   Overweight 08/14/2014   Tinnitus 08/14/2014   Vitamin D deficiency 08/14/2014   Sinus tachycardia 11/25/2012   DOE (dyspnea on exertion) 11/06/2012    Allergies:  Allergies  Allergen Reactions   Azithromycin Diarrhea   Penicillins Hives and Swelling    Has patient had a PCN reaction causing immediate rash, facial/tongue/throat swelling, SOB or lightheadedness with hypotension: Yes Has patient had a PCN reaction causing severe rash involving mucus membranes or skin necrosis: No Has patient had a PCN reaction that required hospitalization No Has patient had a PCN reaction occurring within the last 10 years: No If all of the above answers are "NO", then may proceed with Cephalosporin use.   Medications:  Current Outpatient Medications:    ALPRAZolam (XANAX XR) 1 MG 24 hr tablet, Take 1 tablet (1 mg total) by mouth every morning., Disp: 90 tablet, Rfl: 0   clopidogrel (PLAVIX) 75 MG tablet, Take 75 mg by mouth daily., Disp: , Rfl:    cyclobenzaprine (FLEXERIL) 10 MG tablet, TAKE 1 TABLET(10 MG) BY MOUTH AT BEDTIME, Disp: 90 tablet, Rfl: 0   fluticasone furoate-vilanterol (BREO ELLIPTA) 100-25 MCG/ACT AEPB, Inhale 1 puff into the lungs daily., Disp: 180 each, Rfl: 1   gabapentin (NEURONTIN) 100 MG capsule, Take 100 mg by mouth 3 (three) times daily., Disp: , Rfl:     hydrocortisone 2.5 % cream, Apply topically., Disp: , Rfl:    lamoTRIgine (LAMICTAL) 100 MG tablet, Take 100 mg by mouth 2 (two) times daily., Disp: , Rfl:    midodrine (PROAMATINE) 5 MG tablet, Take 1 tablet (5 mg) by mouth three times a day at 7 am, 11 am, & 3 pm, Disp: 270 tablet, Rfl: 0   PARoxetine (PAXIL-CR) 25 MG 24 hr tablet, TAKE 2 TABLET BY MOUTH DAILY, Disp: 180 tablet, Rfl: 1   sodium chloride 1 g tablet, Take 1 tablet (1 g total) by mouth 3 (three) times daily with meals., Disp: 270 tablet, Rfl: 3   UBRELVY 100 MG TABS, Take 100 mg by mouth daily as needed (migraine). , Disp: , Rfl:    Vitamin D, Ergocalciferol, (DRISDOL) 1.25 MG (50000 UNIT) CAPS capsule, TAKE 1 CAPSULE BY MOUTH EVERY 7 DAYS, Disp: 12 capsule, Rfl: 1   zolpidem (AMBIEN CR) 12.5 MG CR tablet, Take 1 tablet (12.5 mg total) by mouth at bedtime as needed for sleep., Disp: 90 tablet, Rfl: 0  Observations/Objective: Patient is well-developed, well-nourished in no acute distress.  Resting comfortably  at home.  Head is normocephalic, atraumatic.  No labored breathing.  Speech is clear and coherent with logical content.  Patient is alert and oriented at baseline.    Assessment and Plan: 1. Weight gain 2. Leg swelling 3. Shortness of breath Patient does not want to go to ED, discussed options of Urgent Care tonight for workup labs/EKG and if deemed necessary with in person assessment she may need ED for extended workup.   Patient is agreeable to plan and will seek UC visit immediately      Follow Up Instructions: I discussed the assessment and treatment plan with the patient. The patient was provided an opportunity to ask questions and all were answered. The patient agreed with the plan and demonstrated an understanding of the instructions.  A copy of instructions were sent to the patient via MyChart unless otherwise noted below.    The patient was advised to call back or seek an in-person evaluation if the  symptoms worsen or if the condition fails to improve as anticipated.  Time:  I spent 20 minutes with the patient via telehealth technology discussing the above problems/concerns.    Alicia Simas, FNP

## 2022-06-06 ENCOUNTER — Encounter: Payer: Self-pay | Admitting: Adult Health

## 2022-06-06 ENCOUNTER — Ambulatory Visit (INDEPENDENT_AMBULATORY_CARE_PROVIDER_SITE_OTHER): Payer: Self-pay | Admitting: Adult Health

## 2022-06-06 VITALS — BP 105/61 | HR 104 | Temp 98.6°F | Resp 20 | Wt 207.4 lb

## 2022-06-06 DIAGNOSIS — R0602 Shortness of breath: Secondary | ICD-10-CM

## 2022-06-06 DIAGNOSIS — R635 Abnormal weight gain: Secondary | ICD-10-CM

## 2022-06-06 DIAGNOSIS — R0789 Other chest pain: Secondary | ICD-10-CM

## 2022-06-06 NOTE — Progress Notes (Signed)
[facility] [address] [place], [place] [address]   Office Visit Note  Patient Name: Alicia [name] Note [place] Date of Birth [id number]  Medical Record number [id number]  Date of Service: 06/06/2022  Chief Complaint  Patient presents with   Chest Pain    20 lb weight gain in 1.5 weeks, shortness of breath increased at bedtime, edema to ankles, shoulder pain and chest tightness     HPI Pt is here for acute visit.  She reports 20 lb weight gain over the last week.  She also reports shortness of breath with exertion that is worse at bedtime.  She is having edema to bilateral ankles, and also reports some right shoulder pain, and some chest tightness and left jaw pain last night.   History of OSA History of CVA 2018, 2019, Sinus tach for many years TIA in July 2023  Pt was seen for Telehealth visit yesterday, and advised to go to ED/Urgent care.  She came to this clinic today.   Current Medication:  Outpatient Encounter Medications as of 06/06/2022  Medication Sig   ALPRAZolam (XANAX XR) 1 MG 24 hr tablet Take 1 tablet (1 mg total) by mouth every morning.   clopidogrel (PLAVIX) 75 MG tablet Take 75 mg by mouth daily.   cyclobenzaprine (FLEXERIL) 10 MG tablet TAKE 1 TABLET(10 MG) BY MOUTH AT BEDTIME   fluticasone furoate-vilanterol (BREO ELLIPTA) 100-25 MCG/ACT AEPB Inhale 1 puff into the lungs daily.   gabapentin (NEURONTIN) 100 MG capsule Take 100 mg by mouth 3 (three) times daily.   hydrocortisone 2.5 % cream Apply topically.   lamoTRIgine (LAMICTAL) 100 MG tablet Take 100 mg by mouth 2 (two) times daily.   midodrine (PROAMATINE) 5 MG tablet Take 1 tablet (5 mg) by mouth three times a day at 7 am, 11 am, & 3 pm   PARoxetine (PAXIL-CR) 25 MG 24 hr tablet TAKE 2 TABLET BY MOUTH DAILY   sodium chloride 1 g tablet Take 1 tablet (1 g total) by mouth 3 (three) times daily with meals.   UBRELVY 100 MG TABS Take 100 mg by mouth daily as needed (migraine).    Vitamin D,  Ergocalciferol, (DRISDOL) 1.25 MG (50000 UNIT) CAPS capsule TAKE 1 CAPSULE BY MOUTH EVERY 7 DAYS   zolpidem (AMBIEN CR) 12.5 MG CR tablet Take 1 tablet (12.5 mg total) by mouth at bedtime as needed for sleep.   No facility-administered encounter medications on file as of 06/06/2022.      Medical History: Past Medical History:  Diagnosis Date   Anemia    Anxiety    Depression    Dyspnea    SINCE STROKE   GERD (gastroesophageal reflux disease)    Hyperlipidemia    Hypertension    IBS (irritable bowel syndrome)    Migraine    Previous cesarean delivery, delivered, with or without mention of antepartum condition 01/19/2012   Psychogenic nonepileptic seizure    hx/notes 12/19/2016-LAST SEIZURE IN 2019-ON NO MEDS AS OF 05-24-19   Restless leg syndrome    Sleep apnea    USES CPAP   Stroke 12/18/2016   Acute arterial ischemic stroke, multifocal, posterior circulation /notes 12/19/2016-LEFT SIDED WEAKNESS, MEMORY FATIGUE AND TROUBLE FINDING HER WORDS OCC     Vital Signs: BP 105/61   Pulse (!) 104   Temp 98.6 F (37 C) (Tympanic)   Resp 20   Wt 207 lb 6.4 oz (94.1 kg)   LMP 01/15/2015   SpO2 100%  BMI 32.24 kg/m    Review of Systems  Constitutional:  Positive for unexpected weight change. Negative for chills, fatigue and fever.  HENT:  Negative for congestion, sore throat and trouble swallowing.   Respiratory:  Positive for cough, chest tightness and shortness of breath.   Cardiovascular:  Positive for chest pain and leg swelling.  Gastrointestinal:  Negative for diarrhea, nausea and vomiting.  Neurological:  Negative for dizziness.    Physical Exam Vitals and nursing note reviewed.  Constitutional:      Appearance: She is well-developed.  Cardiovascular:     Rate and Rhythm: Tachycardia present. Rhythm irregular.  Pulmonary:     Breath sounds: Examination of the right-lower field reveals decreased breath sounds. Examination of the left-lower field reveals decreased  breath sounds. Decreased breath sounds present.     Comments: Diminished breath sounds in bilateral bases, no wheezing or rales noted at this time. Patient became sob while breathing deep during assessment.  Neurological:     General: No focal deficit present.     Mental Status: She is alert and oriented to person, place, and time.    Assessment/Plan: 1. Shortness of breath EKG does not appear grossly abnormal. NSR.  - EKG 12-Lead  2. Atypical chest pain Discussed going to see her Cardiologist Urgently and if they are unable to see her today, she should go directly to the ED for workup. She is agreeable to this plan.   3. Weight gain Likely fluid accumulation     General Counseling: Latalia verbalizes understanding of the findings of todays visit and agrees with plan of treatment. I have discussed any further diagnostic evaluation that may be needed or ordered today. We also reviewed her medications today. she has been encouraged to call the office with any questions or concerns that should arise related to todays visit.   Orders Placed This Encounter  Procedures   EKG 12-Lead    No orders of the defined types were placed in this encounter.   Time spent:20 Minutes    Johnna Acosta AGNP-C Nurse Practitioner

## 2022-06-10 ENCOUNTER — Other Ambulatory Visit: Payer: Self-pay | Admitting: Medical

## 2022-06-10 ENCOUNTER — Telehealth: Payer: Self-pay | Admitting: Internal Medicine

## 2022-06-10 NOTE — Telephone Encounter (Signed)
Called patient to follow up on mychart message.  Pt c/o of Chest Pain: STAT if CP now or developed within 24 hours  1. Are you having CP right now? No   2. Are you experiencing any other symptoms (ex. SOB, nausea, vomiting, sweating)? Stomach pains, bloating, leg swelling, chest pressure, gained 18 pounds between April 4- April 11th  3. How long have you been experiencing CP? Started on the 8th  4. Is your CP continuous or coming and going? Comes and goes  5. Have you taken Nitroglycerin? No  Patient also states she went to her health clinic at work and they ordered a "protein test, and it came back high". They also did an EKG and advised her to go to the ED, but she wanted to see Dr. Graciela Husbands 1st.   Pt c/o swelling: STAT is pt has developed SOB within 24 hours  If swelling, where is the swelling located? Bilateral legs  How much weight have you gained and in what time span? 18 pounds between April 4- April 11  Have you gained 3 pounds in a day or 5 pounds in a week? yes  Do you have a log of your daily weights (if so, list)? no  Are you currently taking a fluid pill? no  Are you currently SOB? yes  Have you traveled recently? no  ?

## 2022-06-10 NOTE — Telephone Encounter (Signed)
Returned the call to the patient concerning her message. She stated that she has had a 20 pound weight gain in the matter of a week and a half. She has been having decreased urine output even though she drinks 40 ounces of water daily. She is on a sodium chloride tablet.  She has also been having chest discomfort that radiates to her jaw as well as complaints of being sweaty and clammy. She had an EKG completed at her work which was concerning to them. She was then advised to go to the ED.   She wanted to wait and see Dr. Graciela Husbands but has been advised that he does not have any openings and her symptoms are concerning with the weight gain, shortness of breath, bilateral leg edema, and chest discomfort. She has been advised to go to the ED and has verbalized her understanding. She stated that she will go.

## 2022-06-11 ENCOUNTER — Emergency Department: Payer: BC Managed Care – PPO

## 2022-06-11 ENCOUNTER — Other Ambulatory Visit: Payer: Self-pay

## 2022-06-11 ENCOUNTER — Emergency Department
Admission: EM | Admit: 2022-06-11 | Discharge: 2022-06-11 | Disposition: A | Discharge status: Home / Self Care | Payer: BC Managed Care – PPO | Attending: Emergency Medicine | Admitting: Emergency Medicine

## 2022-06-11 DIAGNOSIS — J9811 Atelectasis: Secondary | ICD-10-CM | POA: Diagnosis not present

## 2022-06-11 DIAGNOSIS — R079 Chest pain, unspecified: Secondary | ICD-10-CM | POA: Diagnosis not present

## 2022-06-11 DIAGNOSIS — R0789 Other chest pain: Secondary | ICD-10-CM | POA: Diagnosis not present

## 2022-06-11 DIAGNOSIS — R0602 Shortness of breath: Secondary | ICD-10-CM | POA: Diagnosis not present

## 2022-06-11 DIAGNOSIS — Z20822 Contact with and (suspected) exposure to covid-19: Secondary | ICD-10-CM | POA: Diagnosis not present

## 2022-06-11 LAB — COMPREHENSIVE METABOLIC PANEL
ALT: 19 U/L (ref 0–44)
AST: 20 U/L (ref 15–41)
Albumin: 4 g/dL (ref 3.5–5.0)
Alkaline Phosphatase: 71 U/L (ref 38–126)
Anion gap: 8 (ref 5–15)
BUN: 14 mg/dL (ref 6–20)
CO2: 27 mmol/L (ref 22–32)
Calcium: 8.9 mg/dL (ref 8.9–10.3)
Chloride: 102 mmol/L (ref 98–111)
Creatinine, Ser: 0.98 mg/dL (ref 0.44–1.00)
GFR, Estimated: >60 mL/min (ref >60)
Glucose, Bld: 123 mg/dL — ABNORMAL HIGH (ref 70–99)
Potassium: 4 mmol/L (ref 3.5–5.1)
Sodium: 137 mmol/L (ref 135–145)
Total Bilirubin: 0.5 mg/dL (ref 0.3–1.2)
Total Protein: 6.5 g/dL (ref 6.5–8.1)

## 2022-06-11 LAB — TROPONIN I (HIGH SENSITIVITY)
Troponin I (High Sensitivity): <2 ng/L (ref <18)
Troponin I (High Sensitivity): <2 ng/L (ref <18)

## 2022-06-11 LAB — CBC
HCT: 40.6 % (ref 36.0–46.0)
Hemoglobin: 13.6 g/dL (ref 12.0–15.0)
MCH: 31.1 pg (ref 26.0–34.0)
MCHC: 33.5 g/dL (ref 30.0–36.0)
MCV: 92.7 fL (ref 80.0–100.0)
Platelets: 274 10*3/uL (ref 150–400)
RBC: 4.38 MIL/uL (ref 3.87–5.11)
RDW: 12.5 % (ref 11.5–15.5)
WBC: 8.8 10*3/uL (ref 4.0–10.5)
nRBC: 0 % (ref 0.0–0.2)

## 2022-06-11 LAB — BRAIN NATRIURETIC PEPTIDE: B Natriuretic Peptide: 31.9 pg/mL (ref 0.0–100.0)

## 2022-06-11 LAB — SARS CORONAVIRUS 2 BY RT PCR: SARS Coronavirus 2 by RT PCR: NEGATIVE

## 2022-06-11 MED ORDER — IOHEXOL 350 MG/ML SOLN
75.0000 mL | Freq: Once | INTRAVENOUS | Status: AC | PRN
  Administered 2022-06-11: 75 mL via INTRAVENOUS

## 2022-06-11 NOTE — ED Provider Notes (Signed)
[facility] Provider Note  Patient Contact: 10:24 PM (approximate)   History   Chest Pain and Shortness of Breath   HPI  Angi Goodell Note Alicia [name] is a 52 y.o. female with a history of hyperlipidemia, GERD, depression, psychogenic seizures and migraines, presents to the emergency department with concern for midsternal chest discomfort as well as shortness of breath. These symptoms have occurred over the past several weeks but patient has noticed them more over the past 2-3 days.   Patient reports that she and her husband have both had weight gain and subjective accounts of lower extremity swelling for the past several weeks.  Patient denies any prodrome of recent illness and has not been coughing.  No nausea, vomiting or abdominal pain.  No fever. She denies redness or warmth of the lower extremities.       Physical Exam   Triage Vital Signs: ED Triage Vitals  Enc Vitals Group     BP 06/11/22 1925 (!) 141/64     Pulse Rate 06/11/22 1925 88     Resp 06/11/22 1925 19     Temp 06/11/22 1925 98.1 F (36.7 C)     Temp Source 06/11/22 1925 Oral     SpO2 06/11/22 1925 99 %     Weight 06/11/22 1924 205 lb (93 kg)     Height --      Head Circumference --      Peak Flow --      Pain Score 06/11/22 1924 6     Pain Loc --      Pain Edu? --      Excl. in GC? --     Most recent vital signs: Vitals:   06/11/22 1925 06/11/22 2130  BP: (!) 141/64 138/71  Pulse: 88 81  Resp: 19 11  Temp: 98.1 F (36.7 C)   SpO2: 99% 96%     General: Alert and in no acute distress. Eyes:  PERRL. EOMI. Head: No acute traumatic findings ENT:      Nose: No congestion/rhinnorhea.      Mouth/Throat: Mucous membranes are moist.  Neck: No stridor. No cervical spine tenderness to palpation. Cardiovascular:  Good peripheral perfusion Respiratory: Normal respiratory effort without tachypnea or retractions. Lungs CTAB. Good air entry to the bases with no decreased or absent breath  sounds. Gastrointestinal: Bowel sounds 4 quadrants. Soft and nontender to palpation. No guarding or rigidity. No palpable masses. No distention. No CVA tenderness. Musculoskeletal: Full range of motion to all extremities.  Neurologic:  No gross focal neurologic deficits are appreciated.  Skin:   No rash noted. No pitting edema.     ED Results / Procedures / Treatments   Labs (all labs ordered are listed, but only abnormal results are displayed) Labs Reviewed  COMPREHENSIVE METABOLIC PANEL - Abnormal; Notable for the following components:      Result Value   Glucose, Bld 123 (*)    All other components within normal limits  SARS CORONAVIRUS 2 BY RT PCR  CBC  BRAIN NATRIURETIC PEPTIDE  TROPONIN I (HIGH SENSITIVITY)  TROPONIN I (HIGH SENSITIVITY)     EKG  Normal sinus rhythm without ST segment elevation or other apparent arrhythmia.   RADIOLOGY  I personally viewed and evaluated these images as part of my medical decision making, as well as reviewing the written report by the radiologist.  ED Provider Interpretation: Chest x-ray shows no acute abnormality.   PROCEDURES:  Critical Care performed: No  Procedures  MEDICATIONS ORDERED IN ED: Medications  iohexol (OMNIPAQUE) 350 MG/ML injection 75 mL (75 mLs Intravenous Contrast Given 06/11/22 2234)     IMPRESSION / MDM / ASSESSMENT AND PLAN / ED COURSE  I reviewed the triage vital signs and the nursing notes.                              Assessment and plan:  Chest pain: SOB 52 year old female presents to the emergency department with subjective complaints of chest pain, shortness of breath, weight gain and lower extremity swelling.  Vital signs were reassuring at triage.  On exam, patient appears to be resting comfortably with no perceived increased work of breathing.  She has good breath sounds in the lung bases without other adventitious lung sounds.  She has no pitting edema of the lower extremities and there  is no erythema.  Differential diagnosis includes PE, bronchitis, viral URI, pneumothorax, costochondritis, anxiety...  CBC, CMP, troponin and BNP within range.  Patient tested negative for COVID-19.  Chest x-ray unremarkable for pneumonia, pneumothorax or other acute abnormality.  CTA of the chest shows no concern for PE.  Reassurance was given. Return precautions were given to return with new or worsening symptoms.        FINAL CLINICAL IMPRESSION(S) / ED DIAGNOSES   Final diagnoses:  Nonspecific chest pain     Rx / DC Orders   ED Discharge Orders     None        Note:  This document was prepared using Dragon voice recognition software and may include unintentional dictation errors.   Pia Mau Kelford, PA-C 06/11/22 2321    Chesley Noon, MD 06/17/22 [phone]

## 2022-06-11 NOTE — ED Provider Notes (Incomplete)
[facility] Provider Note  Patient Contact: 10:15 PM (approximate)   History   Chest Pain and Shortness of Breath   HPI  Alicia [name] is a 52 y.o. female with a recent      Physical Exam   Triage Vital Signs: ED Triage Vitals  Enc Vitals Group     BP 06/11/22 1925 (!) 141/64     Pulse Rate 06/11/22 1925 88     Resp 06/11/22 1925 19     Temp 06/11/22 1925 98.1 F (36.7 C)     Temp Source 06/11/22 1925 Oral     SpO2 06/11/22 1925 99 %     Weight 06/11/22 1924 205 lb (93 kg)     Height --      Head Circumference --      Peak Flow --      Pain Score 06/11/22 1924 6     Pain Loc --      Pain Edu? --      Excl. in GC? --     Most recent vital signs: Vitals:   06/11/22 1925 06/11/22 2130  BP: (!) 141/64 138/71  Pulse: 88 81  Resp: 19 11  Temp: 98.1 F (36.7 C)   SpO2: 99% 96%     General: Alert and in no acute distress. {**Eyes:  PERRL. EOMI.**} {**Head: No acute traumatic findings**} {**ENT:      Ears:       Nose: No congestion/rhinnorhea.      Mouth/Throat: Mucous membranes are moist.**}  {**Neck: No stridor. No cervical spine tenderness to palpation.**} {**Hematological/Lymphatic/Immunilogical: No cervical lymphadenopathy.**}  Cardiovascular:  Good peripheral perfusion Respiratory: Normal respiratory effort without tachypnea or retractions. Lungs CTAB. {**Good air entry to the bases with no decreased or absent breath sounds.**} {**Gastrointestinal: Bowel sounds 4 quadrants. Soft and nontender to palpation. No guarding or rigidity. No palpable masses. No distention. No CVA tenderness.**} Musculoskeletal: Full range of motion to all extremities.  Neurologic:  No gross focal neurologic deficits are appreciated.  Skin:   No rash noted Other:   ED Results / Procedures / Treatments   Labs (all labs ordered are listed, but only abnormal results are displayed) Labs Reviewed  COMPREHENSIVE METABOLIC PANEL - Abnormal;  Notable for the following components:      Result Value   Glucose, Bld 123 (*)    All other components within normal limits  SARS CORONAVIRUS 2 BY RT PCR  CBC  BRAIN NATRIURETIC PEPTIDE  TROPONIN I (HIGH SENSITIVITY)  TROPONIN I (HIGH SENSITIVITY)     EKG  ***   RADIOLOGY  {**I personally viewed and evaluated these images as part of my medical decision making, as well as reviewing the written report by the radiologist.  ED Provider Interpretation: **}   PROCEDURES:  Critical Care performed: {CriticalCareYesNo:19197::"Yes, see critical care procedure note(s)","No"}  Procedures   MEDICATIONS ORDERED IN ED: Medications - No data to display   IMPRESSION / MDM / ASSESSMENT AND PLAN / ED COURSE  I reviewed the triage vital signs and the nursing notes.                              Differential diagnosis includes, but is not limited to, ***  {**The patient is on the cardiac monitor to evaluate for evidence of arrhythmia and/or significant heart rate changes.**}  ***      FINAL CLINICAL IMPRESSION(S) / ED DIAGNOSES  Final diagnoses:  None     Rx / DC Orders   ED Discharge Orders     None        Note:  This document was prepared using Dragon voice recognition software and may include unintentional dictation errors.

## 2022-06-11 NOTE — ED Triage Notes (Signed)
Pt presents to ER with c/o chest pain and sob that started a few days ago.  Pt states she has hx of some kind of heart issues, but is unable to say exactly what it is.  Pt states she has not had any recent cough, fever, or other cold/flu symptoms.  Pt does reports new weight gain that started around 2 weeks ago.  Pt reports appx 15-20 pound weight gain, with some BIL leg and abd swelling.  Pt is otherwise A&O x4 and in NAD in triage.

## 2022-06-13 NOTE — Telephone Encounter (Signed)
Seen in ER following call to office visit Back to normal rhythm   Doing better much  Alicia [name] -- can we get her in sometime in June plz

## 2022-06-21 NOTE — Progress Notes (Signed)
Name: Alicia [name]   MRN: [id number]    DOB: 1970/06/20   Date:06/24/2022       Progress Note  Subjective  Chief Complaint  Follow Up  HPI  CVA with hemiparesis : 12/18/2016, admitted to [facility] , mild distal left PICA. She is feeling better, but still has lack of balance but not using a cane, episodes of dysarthria, continues to have tremors, and has some left side weakness .  She still has some short term memory difficulties and difficulty finding the correct words  to express herself at times.  She had at least 4 seizure like activities first one in 2018 ,  09/2017, had one in April after surgery 05/2019, she states had a similar episode Fall 2022 .She states when at work and super tired she falls asleep on her desk. She feels very sleepy and lethargic and has to put her head down, it can last 8-9 minutes and when she wakes up she is able to function. At times she has episodes of nausea, gets confused and has to rest She  is taking Crestor. She recently went to [facility] - Dr. Gevena Cotton  02/2021 , had repeat MRI and advised to stop aspirin and start Plavix  since studies showed not responsive to aspirin, there is still a concern for mild FMD in her cervical ICA and vertebral arteries. Possible PFO but cardiac MRI negative for cardio embolic source. She had another episode of confusion and left side weakness on 07/23  but CTA, MRI and CT all negative except for old lacunar infarct - she states still feeling weak on left side and some dysarthria , she has mental fogginess, she used to work from home a few times a week but her employer no longer offers remote work , she states work has been more stressful over the past couple of weeks. She closes her office door to rest when feeling very tired.   Summary of transcranial doppler 05/30/2020 No HITS at rest or during Valsalva. Negative transcranial Doppler Bubble study with no evidence of right to left intracardiac communication. A  vascular evaluation was performed. The right middle cerebral artery was studied. An IV was inserted into the patient's left antecubital. Verbal informed consent was obtained. Negative TCD Bubble study  Cardiac MRI heart: 12/11/2020   Normal EF, unremarkable  Migraine : She is under the care of Dr. Sherryll Burger. Currently on Lamictal and also prn Ubrelvy, episodes are up again once or twice a week again, states worries about what is going on in the world. She misses work about 4 hours q 2 weeks due to migraine headaches. She states described as a band around her head and associated with phonophobia and photophobia Unchanged   Orthostatic hypotension with tachycardia/Dysautonomia :   and had  TEE for evaluation of TIA's and strokes to rule out foramen ovale - so far no structural heart disease found. She is off digoxin but still taking midrodine, symptoms are stable   Long haul COVID-19: diagnosed 02/04/2020, she continues to have fatigue and SOB with activity . She already had some brain fogginess before COVID-19 but feels worse since COVID , she also has intermittent chest tightness COVID-19 , she had a second episode of COVID-05 Feb 2021. She feels like myalgia and fatigues , she feels like muscle aches and joint pains are getting worse. Seeing psyatrist in town. Symptoms are stable    GAD/MDD : Husband died suddenly of a brain aneurysm back in  October 23 rd, 2016.  She has been taking her antidepressant medication and Alprazolam as prescribed.  She remarried her high school sweet heart 06/07/2016 ( and he moved here from [place])  Had a stroke 12/2016. We adjusted Paxil  dose from 40 mg to 60 mg Fall of 2020 and symptoms were controlled, we switched sustained release 37.5  but she noticed it was not working as well so she added 20 mg immediate release. Insurance denied paying for two separate formulations and is currently taking two  25 CR doses but feels like it is not enough. Currently on 50 mg and symptoms  are stable. She still avoid crowds, work still allows her to not participate on larger meetings. Accomodation are in place at work   Hypertriglyceridemia: currently off Vascepa due to cost and nausea, she also stopped taking Crestor but states it was causing her headaches and muscle pain. LDL during hospital stay was up to 86, HDL 42 , triglycerides was up.    Pre-diabetes : last A1C was 6 % back in July 2023  She denies polyphagia, polydipsia or polyuria. She is trying to cut down on carbohydrates   Insomnia: taking Ambien and is taking her less than 30 minutes to fall asleep .She is aware of long term risk of Ambien, but states cannot sleep without it. She also knows that the dose she is taking is not FDA approved for females. She states medication is working now, when tired enough able to fall and stay asleep. We tried going down on flexeril because of compounding sedative effect, but neck pain and headaches got worse, she was unable to go down on Flexeril but lower dose did not work for neck spasms. She states she has a good bedtime routine, stops screen 30 minutes before time and chats with her husband before bed. Symptoms are stable     Neck and back pain: currently seeing psychiatrist , taking Flexeril , still cannot afford PT. She had a positive HLA B27. She is now also taking gabapentin 100 mg in am, at lunch and 300 mg at night. She states medication seems to help with pain, symptoms are stable   Thyroid nodule:  also has low T3 but normal TSH  IMPRESSION: 12/2019  1. Mildly heterogeneous but otherwise unremarkable thyroid gland without evidence of nodule. 2. The palpable abnormality corresponds with a small 0.6 cm lymph node. Favor reactive lymphadenopathy. Recommend continued clinical surveillance. If there is evidence of growth over time, repeat imaging and potentially biopsy may become warranted.  GERD:she had an esophogram, barium swallow done on 05/19/2019 that showed mild GERD,  she was able to stop PPI and is doing well, only takes Tums prn   OSA: wearing CPAP every night now  Doing well    Reactive Airway Dysfunction syndrome: she saw Jana Half, pulmonologist, advised to stop Symbicort , take prn albuterol and is now on Breo . She had COVID Dec 2021 and again Dec 2022 - she continues to have intermittent SOB, usually triggered by activity She has been back on Breo and doing well , except for this past few days when she developed facial pressure, hoarseness and a cough that sounds wet , no fever or chills. Other family members with same symptoms   Leukocytoclastic vasculitis: based on skin biopsy done by Dr. Gwen Pounds, multiple labs done and negative, she saw  Dr. Renard Matter in the past and is under the care of Dr. Allena Katz n, first visit in early 2024 , B!2  very low and getting monthly injections. Feeling better   Patient Active Problem List   Diagnosis Date Noted   Dysautonomia (HCC) 04/03/2022   Hemiparesis of left nondominant side as late effect of cerebral infarction (HCC) 04/03/2022   Mild episode of recurrent major depressive disorder (HCC) 04/03/2022   HLA B27 (HLA B27 positive) 04/03/2022   Essential hypertension 08/30/2021   COVID-19 long hauler manifesting chronic fatigue 03/21/2020   Orthostatic hypotension 02/23/2020   Atypical chest pain 02/23/2020   Reactive airways dysfunction syndrome (HCC) 10/15/2019   Hyperfunction of pituitary gland, unspecified (HCC) 10/15/2019   Calculus of gallbladder without cholecystitis without obstruction 05/06/2019   Cyst of pancreas 05/06/2019   Right kidney stone 05/06/2019   Leukocytoclastic vasculitis (HCC) 10/07/2018   Chronic insomnia 02/09/2018   History of anemia 02/09/2018   Sleep apnea 09/24/2017   History of ischemic stroke 05/30/2017   Hyperlipidemia 05/30/2017   Migraine without aura and without status migrainosus, not intractable 05/27/2017   Pain in right knee 01/21/2017   Psychogenic nonepileptic  seizure    Chronic pain syndrome    Restless leg syndrome    Chronic prescription benzodiazepine use 01/20/2016   Leukocytosis 01/20/2016   Seizure-like activity (HCC)    GAD (generalized anxiety disorder) 02/14/2015   Chronic neck pain 02/14/2015   History of hysterectomy 02/14/2015   Iron deficiency anemia due to chronic blood loss 08/29/2014   Insomnia, persistent 08/14/2014   Major depression (HCC) 08/14/2014   Temporomandibular joint sounds on opening and/or closing the jaw 08/14/2014   Degenerative disc disease, lumbar 08/14/2014   Bleeding internal hemorrhoids 08/14/2014   Gastric reflux 08/14/2014   Irritable bowel syndrome with constipation 08/14/2014   Hypertriglyceridemia 08/14/2014   Overweight 08/14/2014   Tinnitus 08/14/2014   Vitamin D deficiency 08/14/2014   Sinus tachycardia 11/25/2012   DOE (dyspnea on exertion) 11/06/2012    Past Surgical History:  Procedure Laterality Date   ABDOMINOPLASTY  Feb. 2015   CESAREAN SECTION  2011   CHOLECYSTECTOMY N/A 05/31/2019   Procedure: LAPAROSCOPIC CHOLECYSTECTOMY WITH INTRAOPERATIVE CHOLANGIOGRAM;  Surgeon: Earline Mayotte, MD;  Location: [facility] ORS;  Service: General;  Laterality: N/A;   COLONOSCOPY WITH PROPOFOL N/A 11/26/2016   Procedure: COLONOSCOPY WITH PROPOFOL;  Surgeon: Wyline Mood, MD;  Location: [facility] ENDOSCOPY;  Service: Gastroenterology;  Laterality: N/A;   COLONOSCOPY WITH PROPOFOL N/A 08/08/2020   Procedure: COLONOSCOPY WITH PROPOFOL;  Surgeon: Regis Bill, MD;  Location: [facility] ENDOSCOPY;  Service: Endoscopy;  Laterality: N/A;   CYSTOSCOPY  02/02/2015   Procedure: CYSTOSCOPY;  Surgeon: Nadara Mustard, MD;  Location: [facility] ORS;  Service: Gynecology;;   DILATION AND CURETTAGE OF UTERUS  2003, 2005, 2008   ESOPHAGOGASTRODUODENOSCOPY N/A 05/31/2019   Procedure: ESOPHAGOGASTRODUODENOSCOPY (EGD);  Surgeon: Earline Mayotte, MD;  Location: [facility] ORS;  Service: General;  Laterality: N/A;  in the O.R.    ESOPHAGOGASTRODUODENOSCOPY (EGD) WITH PROPOFOL N/A 11/26/2016   Procedure: ESOPHAGOGASTRODUODENOSCOPY (EGD) WITH PROPOFOL;  Surgeon: Wyline Mood, MD;  Location: [facility] ENDOSCOPY;  Service: Gastroenterology;  Laterality: N/A;   HERNIA REPAIR  1999   Umbilical   KNEE ARTHROSCOPY Right 2006   LAPAROSCOPIC BILATERAL SALPINGECTOMY Bilateral 02/02/2015   Procedure: LAPAROSCOPIC BILATERAL SALPINGECTOMY;  Surgeon: Nadara Mustard, MD;  Location: [facility] ORS;  Service: Gynecology;  Laterality: Bilateral;   LAPAROSCOPIC HYSTERECTOMY N/A 02/02/2015   Procedure: HYSTERECTOMY TOTAL LAPAROSCOPIC;  Surgeon: Nadara Mustard, MD;  Location: [facility] ORS;  Service: Gynecology;  Laterality: N/A;   LOOP RECORDER INSERTION N/A 12/20/2016  Procedure: LOOP RECORDER INSERTION;  Surgeon: Regan Lemming, MD;  [facility];  Service: Cardiovascular;  Laterality: N/A;   REDUCTION MAMMAPLASTY Bilateral December 2015   TEE WITHOUT CARDIOVERSION N/A 12/20/2016   Procedure: TRANSESOPHAGEAL ECHOCARDIOGRAM (TEE);  Surgeon: Lars Masson, MD;  Location: [facility] ENDOSCOPY;  Service: Cardiovascular;  Laterality: N/A;   TEE WITHOUT CARDIOVERSION N/A 03/29/2020   Procedure: TRANSESOPHAGEAL ECHOCARDIOGRAM (TEE);  Surgeon: Debbe Odea, MD;  Location: [facility] ORS;  Service: Cardiovascular;  Laterality: N/A;    Family History  Problem Relation Age of Onset   Hypertension Mother    Hyperlipidemia Mother    Heart Problems Father        hole in heart and lower ventricles reversed   Prostate cancer Maternal Grandfather    Von Willebrand disease Maternal Uncle     Social History   Tobacco Use   Smoking status: Never   Smokeless tobacco: Never  Substance Use Topics   Alcohol use: No    Alcohol/week: 0.0 standard drinks of alcohol     Current Outpatient Medications:    benzonatate (TESSALON) 100 MG capsule, Take 1-2 capsules (100-200 mg total) by mouth 3 (three) times daily as needed., Disp: 60 capsule, Rfl: 0    clopidogrel (PLAVIX) 75 MG tablet, Take 75 mg by mouth daily., Disp: , Rfl:    fluticasone furoate-vilanterol (BREO ELLIPTA) 100-25 MCG/ACT AEPB, Inhale 1 puff into the lungs daily., Disp: 180 each, Rfl: 1   gabapentin (NEURONTIN) 100 MG capsule, Take 100 mg by mouth 3 (three) times daily., Disp: , Rfl:    hydrocortisone 2.5 % cream, Apply topically., Disp: , Rfl:    lamoTRIgine (LAMICTAL) 100 MG tablet, Take 100 mg by mouth 2 (two) times daily., Disp: , Rfl:    midodrine (PROAMATINE) 5 MG tablet, Take 1 tablet (5 mg) by mouth three times a day at 7 am, 11 am, & 3 pm, Disp: 270 tablet, Rfl: 0   PARoxetine (PAXIL-CR) 25 MG 24 hr tablet, TAKE 2 TABLET BY MOUTH DAILY, Disp: 180 tablet, Rfl: 1   predniSONE (DELTASONE) 20 MG tablet, Take 0.5 tablets (10 mg total) by mouth 2 (two) times daily with a meal., Disp: 10 tablet, Rfl: 0   sodium chloride 1 g tablet, Take 1 tablet (1 g total) by mouth 3 (three) times daily with meals., Disp: 270 tablet, Rfl: 3   UBRELVY 100 MG TABS, Take 100 mg by mouth daily as needed (migraine). , Disp: , Rfl:    ALPRAZolam (XANAX XR) 1 MG 24 hr tablet, Take 1 tablet (1 mg total) by mouth every morning., Disp: 90 tablet, Rfl: 0   cyclobenzaprine (FLEXERIL) 10 MG tablet, Take 1 tablet (10 mg total) by mouth at bedtime., Disp: 90 tablet, Rfl: 1   Vitamin D, Ergocalciferol, (DRISDOL) 1.25 MG (50000 UNIT) CAPS capsule, Take 1 capsule (50,000 Units total) by mouth every 7 (seven) days., Disp: 12 capsule, Rfl: 1   zolpidem (AMBIEN CR) 12.5 MG CR tablet, Take 1 tablet (12.5 mg total) by mouth at bedtime as needed for sleep., Disp: 90 tablet, Rfl: 0  Allergies  Allergen Reactions   Azithromycin Diarrhea   Penicillins Hives and Swelling    Has patient had a PCN reaction causing immediate rash, facial/tongue/throat swelling, SOB or lightheadedness with hypotension: Yes Has patient had a PCN reaction causing severe rash involving mucus membranes or skin necrosis: No Has patient had a  PCN reaction that required hospitalization No Has patient had a PCN reaction occurring  within the last 10 years: No If all of the above answers are "NO", then may proceed with Cephalosporin use.    I personally reviewed active problem list, medication list, allergies, family history, social history, health maintenance with the patient/caregiver today.   ROS  Constitutional: Negative for fever , positive for  weight change.  Respiratory: positive for cough and shortness of breath.   Cardiovascular: Negative for chest pain or palpitations.  Gastrointestinal: Negative for abdominal pain, no bowel changes.  Musculoskeletal: Negative for gait problem or joint swelling.  Skin: Negative for rash.  Neurological: positive for intermittent dizziness or headache.  No other specific complaints in a complete review of systems (except as listed in HPI above).   Objective  Vitals:   06/24/22 0850  BP: 108/66  Pulse: (!) 101  Resp: 16  SpO2: 98%  Weight: 206 lb (93.4 kg)  Height: 5\' 8"  (1.727 m)    Body mass index is 31.32 kg/m.  Physical Exam  Constitutional: Patient appears well-developed and well-nourished. Obese  No distress.  HEENT: head atraumatic, normocephalic, pupils equal and reactive to light, neck supple, Cardiovascular: Normal rate, regular rhythm and normal heart sounds.  No murmur heard. No BLE edema. Pulmonary/Chest: Effort normal and breath sounds normal. No respiratory distress. Abdominal: Soft.  There is no tenderness. Psychiatric: Patient has a normal mood and affect. behavior is normal. Judgment and thought content normal.    PHQ2/9:    06/24/2022    8:52 AM 04/03/2022    9:42 AM 11/30/2021    8:19 AM 09/13/2021    9:43 AM 09/06/2021    8:17 AM  Depression screen PHQ 2/9  Decreased Interest 0 1 0 1 1  Down, Depressed, Hopeless 0 1 0 1 1  PHQ - 2 Score 0 2 0 2 2  Altered sleeping 0 1 0 1 1  Tired, decreased energy 1 1 0 1 1  Change in appetite 0 1 0 3 3   Feeling bad or failure about yourself  0 0 0 0 1  Trouble concentrating 1 0 0 0 1  Moving slowly or fidgety/restless 0 0 0 0 0  Suicidal thoughts 0 0 0 1 0  PHQ-9 Score 2 5 0 8 9  Difficult doing work/chores  Somewhat difficult  Somewhat difficult Very difficult    phq 9 is negative   Fall Risk:    06/24/2022    8:51 AM 04/03/2022    9:42 AM 11/30/2021    8:19 AM 09/13/2021    9:42 AM 09/06/2021    8:17 AM  Fall Risk   Falls in the past year? 0 1 1 1 1   Number falls in past yr: 0 1 1 1 1   Injury with Fall? 0 0 1 1 1   Risk for fall due to : No Fall Risks History of fall(s) History of fall(s);Mental status change Impaired balance/gait;History of fall(s) History of fall(s);Impaired balance/gait  Follow up Falls prevention discussed Falls prevention discussed;Education provided;Falls evaluation completed Education provided;Falls evaluation completed;Falls prevention discussed Education provided;Falls prevention discussed Falls evaluation completed      Functional Status Survey: Is the patient deaf or have difficulty hearing?: No Does the patient have difficulty seeing, even when wearing glasses/contacts?: No Does the patient have difficulty concentrating, remembering, or making decisions?: No Does the patient have difficulty walking or climbing stairs?: Yes Does the patient have difficulty dressing or bathing?: Yes Does the patient have difficulty doing errands alone such as visiting a doctor's office or shopping?:  No    Assessment & Plan  1. Recurrent strokes (HCC)  No episodes  since last Summer   2. Chronic neck pain  - cyclobenzaprine (FLEXERIL) 10 MG tablet; Take 1 tablet (10 mg total) by mouth at bedtime.  Dispense: 90 tablet; Refill: 1  3. Dysautonomia (HCC)  Taking medications to keep bp up and symptoms are stable at this time   4. Hemiparesis of left nondominant side as late effect of cerebral infarction (HCC)  Stable   5. Mild episode of recurrent major  depressive disorder (HCC)  On medication and stable  6. Seizure-like activity (HCC)  Under the care of neurologist , no episodes since stroke  7. Vitamin D deficiency  - Vitamin D, Ergocalciferol, (DRISDOL) 1.25 MG (50000 UNIT) CAPS capsule; Take 1 capsule (50,000 Units total) by mouth every 7 (seven) days.  Dispense: 12 capsule; Refill: 1  8. Chronic insomnia  - zolpidem (AMBIEN CR) 12.5 MG CR tablet; Take 1 tablet (12.5 mg total) by mouth at bedtime as needed for sleep.  Dispense: 90 tablet; Refill: 0  9. Migraine without aura and without status migrainosus, not intractable  stable  10. Chronic pain syndrome   11. B12 deficiency  - cyanocobalamin (VITAMIN B12) injection 1,000 mcg  12. HLA B27 (HLA B27 positive)   13. Pre-diabetes   14. GAD (generalized anxiety disorder)  - ALPRAZolam (XANAX XR) 1 MG 24 hr tablet; Take 1 tablet (1 mg total) by mouth every morning.  Dispense: 90 tablet; Refill: 0  15. Dyslipidemia  Not on medication due to either cost or intolerance  16. OSA (obstructive sleep apnea)   17. Moderate persistent asthma with exacerbation  - predniSONE (DELTASONE) 20 MG tablet; Take 0.5 tablets (10 mg total) by mouth 2 (two) times daily with a meal.  Dispense: 10 tablet; Refill: 0 - benzonatate (TESSALON) 100 MG capsule; Take 1-2 capsules (100-200 mg total) by mouth 3 (three) times daily as needed.  Dispense: 60 capsule; Refill: 0

## 2022-06-24 ENCOUNTER — Ambulatory Visit: Payer: BC Managed Care – PPO | Admitting: Family Medicine

## 2022-06-24 ENCOUNTER — Other Ambulatory Visit: Payer: Self-pay

## 2022-06-24 ENCOUNTER — Encounter: Payer: Self-pay | Admitting: Family Medicine

## 2022-06-24 VITALS — BP 108/66 | HR 101 | Resp 16 | Ht 68.0 in | Wt 206.0 lb

## 2022-06-24 DIAGNOSIS — E559 Vitamin D deficiency, unspecified: Secondary | ICD-10-CM

## 2022-06-24 DIAGNOSIS — J4541 Moderate persistent asthma with (acute) exacerbation: Secondary | ICD-10-CM

## 2022-06-24 DIAGNOSIS — I69354 Hemiplegia and hemiparesis following cerebral infarction affecting left non-dominant side: Secondary | ICD-10-CM

## 2022-06-24 DIAGNOSIS — E785 Hyperlipidemia, unspecified: Secondary | ICD-10-CM

## 2022-06-24 DIAGNOSIS — F330 Major depressive disorder, recurrent, mild: Secondary | ICD-10-CM | POA: Diagnosis not present

## 2022-06-24 DIAGNOSIS — G8929 Other chronic pain: Secondary | ICD-10-CM

## 2022-06-24 DIAGNOSIS — F5104 Psychophysiologic insomnia: Secondary | ICD-10-CM

## 2022-06-24 DIAGNOSIS — G4733 Obstructive sleep apnea (adult) (pediatric): Secondary | ICD-10-CM

## 2022-06-24 DIAGNOSIS — E538 Deficiency of other specified B group vitamins: Secondary | ICD-10-CM | POA: Diagnosis not present

## 2022-06-24 DIAGNOSIS — G901 Familial dysautonomia [Riley-Day]: Secondary | ICD-10-CM

## 2022-06-24 DIAGNOSIS — I639 Cerebral infarction, unspecified: Secondary | ICD-10-CM | POA: Diagnosis not present

## 2022-06-24 DIAGNOSIS — R7303 Prediabetes: Secondary | ICD-10-CM

## 2022-06-24 DIAGNOSIS — R569 Unspecified convulsions: Secondary | ICD-10-CM

## 2022-06-24 DIAGNOSIS — G894 Chronic pain syndrome: Secondary | ICD-10-CM

## 2022-06-24 DIAGNOSIS — F411 Generalized anxiety disorder: Secondary | ICD-10-CM

## 2022-06-24 DIAGNOSIS — Z1589 Genetic susceptibility to other disease: Secondary | ICD-10-CM

## 2022-06-24 DIAGNOSIS — G43009 Migraine without aura, not intractable, without status migrainosus: Secondary | ICD-10-CM

## 2022-06-24 MED ORDER — CYANOCOBALAMIN 1000 MCG/ML IJ SOLN
1000.0000 ug | Freq: Once | INTRAMUSCULAR | Status: AC
Start: 2022-06-24 — End: 2022-06-24
  Administered 2022-06-24: 1000 ug via INTRAMUSCULAR

## 2022-06-24 MED ORDER — ALPRAZOLAM ER 1 MG PO TB24: 1.0000 mg | ORAL_TABLET | ORAL | 0 refills | Status: DC

## 2022-06-24 MED ORDER — BENZONATATE 100 MG PO CAPS
100.0000 mg | ORAL_CAPSULE | Freq: Three times a day (TID) | ORAL | 0 refills | Status: DC | PRN
Start: 2022-06-24 — End: 2022-08-08

## 2022-06-24 MED ORDER — VITAMIN D (ERGOCALCIFEROL) 1.25 MG (50000 UNIT) PO CAPS: 50000.0000 [IU] | ORAL_CAPSULE | ORAL | 1 refills | Status: DC

## 2022-06-24 MED ORDER — PREDNISONE 20 MG PO TABS
10.0000 mg | ORAL_TABLET | Freq: Two times a day (BID) | ORAL | 0 refills | Status: DC
Start: 2022-06-24 — End: 2022-08-08

## 2022-06-24 MED ORDER — MIDODRINE HCL 5 MG PO TABS: ORAL_TABLET | ORAL | 0 refills | Status: DC

## 2022-06-24 MED ORDER — ZOLPIDEM TARTRATE ER 12.5 MG PO TBCR
12.5000 mg | EXTENDED_RELEASE_TABLET | Freq: Every evening | ORAL | 0 refills | Status: DC | PRN
Start: 2022-06-24 — End: 2022-07-08

## 2022-06-24 MED ORDER — CYCLOBENZAPRINE HCL 10 MG PO TABS
10.0000 mg | ORAL_TABLET | Freq: Every day | ORAL | 1 refills | Status: DC
Start: 2022-06-24 — End: 2022-12-24

## 2022-07-05 ENCOUNTER — Other Ambulatory Visit: Payer: Self-pay | Admitting: Family Medicine

## 2022-07-05 ENCOUNTER — Other Ambulatory Visit: Payer: Self-pay | Admitting: Gastroenterology

## 2022-07-05 ENCOUNTER — Ambulatory Visit: Payer: Self-pay

## 2022-07-05 DIAGNOSIS — K5909 Other constipation: Secondary | ICD-10-CM | POA: Diagnosis not present

## 2022-07-05 DIAGNOSIS — F5104 Psychophysiologic insomnia: Secondary | ICD-10-CM

## 2022-07-05 DIAGNOSIS — R1013 Epigastric pain: Secondary | ICD-10-CM

## 2022-07-05 DIAGNOSIS — R110 Nausea: Secondary | ICD-10-CM | POA: Diagnosis not present

## 2022-07-05 DIAGNOSIS — R1084 Generalized abdominal pain: Secondary | ICD-10-CM

## 2022-07-05 NOTE — Telephone Encounter (Signed)
Requested medications are due for refill today.  No - please see note.Medication was expired at first [facility]  Requested medications are on the active medications list.  yes  Last refill. Not filled  Future visit scheduled.   yes  Notes to clinic.  Medication was [facility] one [facility]. They had the medication, however it was expired.  Pt need rx to be sent to  [facility] [address] Rx needs to be for 20 tablets. Per pt if it is anyother amount they will not fill medication.    Requested Prescriptions  Pending Prescriptions Disp Refills   zolpidem (AMBIEN CR) 12.5 MG CR tablet 90 tablet 0    Sig: Take 1 tablet (12.5 mg total) by mouth at bedtime as needed for sleep.     Not Delegated - Psychiatry:  Anxiolytics/Hypnotics Failed - 07/05/2022  5:58 PM      Failed - This refill cannot be delegated      Failed - Urine Drug Screen completed in last 360 days      Passed - Valid encounter within last 6 months    Recent Outpatient Visits           1 week ago Recurrent strokes [facility])   [place] [facility] Alba Cory, MD   3 months ago Dysautonomia [facility])   [facility] Alba Cory, MD   7 months ago Migraine without aura and without status migrainosus, not intractable   [facility] Alba Cory, MD   9 months ago Recurrent strokes [facility])   [facility] Alba Cory, MD   10 months ago History of ischemic stroke   [facility] Berniece Salines, FNP       Future Appointments             In 1 month Duke Salvia, MD [facility] at [place]   In 2 months Salena Saner, MD [facility] Care at [place]   In 2 months Alba Cory, MD [facility]

## 2022-07-05 NOTE — Telephone Encounter (Signed)
Med[facility]. They had the medication, however it was expired.  Pt need rx to be sent to  [facility] [address] Rx needs to be for 20 tablets. Per pt if it is anyother amount they will not fill medication.

## 2022-07-05 NOTE — Telephone Encounter (Signed)
Medication Refill - Medication: zolpidem (AMBIEN CR) 12.5 MG CR tablet [[id number]]   Pt is requesting a partial fill to be sent to the below pharmacy.  Has the patient contacted their pharmacy? Yes.   (Agent: If no, request that the patient contact the pharmacy for the refill. If patient does not wish to contact the pharmacy document the reason why and proceed with request.) (Agent: If yes, when and what did the pharmacy advise?)  Preferred Pharmacy (with phone number or street name): [facility] [address] [place], [place] Has the patient been seen for an appointment in the last year OR does the patient have an upcoming appointment? Yes.    Agent: Please be advised that RX refills may take up to 3 business days. We ask that you follow-up with your pharmacy.

## 2022-07-05 NOTE — Telephone Encounter (Signed)
  Chief Complaint: Medication refill Symptoms:  Frequency:  Pertinent Negatives: Patient denies  Disposition: [] ED /[] Urgent Care (no appt availability in office) / [] Appointment(In office/virtual)/ []  [place] [facility]/ [] Home Care/ [] Refused Recommended Disposition /[] [place] [facility]/ []  Follow-up with PCP Additional Notes: [facility]. They had the medication, however it was expired.  Pt need rx to be sent to  [facility] [address] Rx needs to be for 20 tablets. Per pt if it is anyother amount they will not fill medication. Refill request sent to provider.   Reason for Disposition  [1] Prescription refill request for ESSENTIAL medicine (i.e., likelihood of harm to patient if not taken) AND [2] triager unable to refill per department policy  Answer Assessment - Initial Assessment Questions 1. DRUG NAME: "What medicine do you need to have refilled?"     Ambien 2. REFILLS REMAINING: "How many refills are remaining?" (Note: The label on the medicine or pill bottle will show how many refills are remaining. If there are no refills remaining, then a renewal may be needed.)  Protocols used: Medication Refill and Renewal Call-A-AH

## 2022-07-08 ENCOUNTER — Telehealth: Payer: Self-pay | Admitting: Family Medicine

## 2022-07-08 ENCOUNTER — Other Ambulatory Visit: Payer: Self-pay

## 2022-07-08 ENCOUNTER — Encounter: Payer: Self-pay | Admitting: Family Medicine

## 2022-07-08 ENCOUNTER — Other Ambulatory Visit: Payer: Self-pay | Admitting: Family Medicine

## 2022-07-08 DIAGNOSIS — F5104 Psychophysiologic insomnia: Secondary | ICD-10-CM

## 2022-07-08 MED ORDER — ZOLPIDEM TARTRATE ER 12.5 MG PO TBCR
12.5000 mg | EXTENDED_RELEASE_TABLET | Freq: Every evening | ORAL | 2 refills | Status: DC | PRN
Start: 2022-07-08 — End: 2022-10-02

## 2022-07-08 NOTE — Telephone Encounter (Signed)
Patient called stated had just spoken with Carollee Herter about a supply but she needs provider to call in 10 pills of zolpidem (AMBIEN CR) 12.5 MG CR tablet [facility] can get in the medication. She needs them sent in ASAP. Please advise patient.  [id number] [place], [place] - [address] Phone: [phone]  Fax: [phone]

## 2022-07-08 NOTE — Telephone Encounter (Signed)
Pt would like a call from clinical staff due to questions that I cannot answer.

## 2022-07-09 DIAGNOSIS — K582 Mixed irritable bowel syndrome: Secondary | ICD-10-CM | POA: Diagnosis not present

## 2022-07-09 DIAGNOSIS — R1314 Dysphagia, pharyngoesophageal phase: Secondary | ICD-10-CM | POA: Diagnosis not present

## 2022-07-10 ENCOUNTER — Other Ambulatory Visit: Payer: Self-pay | Admitting: Gastroenterology

## 2022-07-10 DIAGNOSIS — R1314 Dysphagia, pharyngoesophageal phase: Secondary | ICD-10-CM

## 2022-07-17 ENCOUNTER — Ambulatory Visit
Admission: RE | Admit: 2022-07-17 | Discharge: 2022-07-17 | Disposition: A | Discharge status: Home / Self Care | Payer: BC Managed Care – PPO | Source: Ambulatory Visit | Attending: Gastroenterology | Admitting: Gastroenterology

## 2022-07-17 DIAGNOSIS — R109 Unspecified abdominal pain: Secondary | ICD-10-CM | POA: Diagnosis not present

## 2022-07-17 DIAGNOSIS — R1013 Epigastric pain: Secondary | ICD-10-CM

## 2022-07-17 DIAGNOSIS — R1084 Generalized abdominal pain: Secondary | ICD-10-CM | POA: Diagnosis not present

## 2022-07-17 DIAGNOSIS — K760 Fatty (change of) liver, not elsewhere classified: Secondary | ICD-10-CM | POA: Diagnosis not present

## 2022-07-17 MED ORDER — IOHEXOL 300 MG/ML  SOLN
100.0000 mL | Freq: Once | INTRAMUSCULAR | Status: AC | PRN
  Administered 2022-07-17: 100 mL via INTRAVENOUS

## 2022-07-19 ENCOUNTER — Ambulatory Visit
Admission: RE | Admit: 2022-07-19 | Discharge: 2022-07-19 | Disposition: A | Discharge status: Home / Self Care | Payer: BC Managed Care – PPO | Source: Ambulatory Visit | Attending: Gastroenterology | Admitting: Gastroenterology

## 2022-07-19 DIAGNOSIS — R1314 Dysphagia, pharyngoesophageal phase: Secondary | ICD-10-CM | POA: Diagnosis not present

## 2022-07-19 DIAGNOSIS — R1310 Dysphagia, unspecified: Secondary | ICD-10-CM | POA: Diagnosis not present

## 2022-07-19 DIAGNOSIS — K449 Diaphragmatic hernia without obstruction or gangrene: Secondary | ICD-10-CM | POA: Diagnosis not present

## 2022-08-05 ENCOUNTER — Encounter: Payer: Self-pay | Admitting: Family Medicine

## 2022-08-08 ENCOUNTER — Ambulatory Visit (INDEPENDENT_AMBULATORY_CARE_PROVIDER_SITE_OTHER): Payer: BC Managed Care – PPO

## 2022-08-08 ENCOUNTER — Inpatient Hospital Stay: Admission: RE | Admit: 2022-08-08 | Payer: BC Managed Care – PPO | Source: Ambulatory Visit

## 2022-08-08 ENCOUNTER — Ambulatory Visit: Payer: BC Managed Care – PPO | Attending: Internal Medicine | Admitting: Internal Medicine

## 2022-08-08 ENCOUNTER — Telehealth: Payer: Self-pay | Admitting: Internal Medicine

## 2022-08-08 VITALS — BP 98/62 | HR 105 | Ht 68.0 in | Wt 209.0 lb

## 2022-08-08 DIAGNOSIS — R9431 Abnormal electrocardiogram [ECG] [EKG]: Secondary | ICD-10-CM

## 2022-08-08 DIAGNOSIS — I493 Ventricular premature depolarization: Secondary | ICD-10-CM | POA: Diagnosis not present

## 2022-08-08 NOTE — Patient Instructions (Signed)
Medication Instructions:  Your physician recommends that you continue on your current medications as directed. Please refer to the Current Medication list given to you today. *If you need a refill on your cardiac medications before your next appointment, please call your pharmacy*   Testing/Procedures: Calcium Score We will contact you to set this up   Zio Cardiac Monitor - this will be mailed to you  Your physician has recommended that you wear an event monitor. Event monitors are medical devices that record the heart's electrical activity. Doctors most often [place] these monitors to diagnose arrhythmias. Arrhythmias are problems with the speed or rhythm of the heartbeat. The monitor is a small, portable device. You can wear one while you do your normal daily activities. This is usually used to diagnose what is causing palpitations/syncope (passing out).  Echocardiogram Your physician has requested that you have an echocardiogram. Echocardiography is a painless test that uses sound waves to create images of your heart. It provides your doctor with information about the size and shape of your heart and how well your heart's chambers and valves are working. This procedure takes approximately one hour. There are no restrictions for this procedure. Please do NOT wear cologne, perfume, aftershave, or lotions (deodorant is allowed). Please arrive 15 minutes prior to your appointment time.    Follow-Up: At [name], you and your health needs are our priority.  As part of our continuing mission to provide you with exceptional heart care, we have created designated Provider Care Teams.  These Care Teams include your primary Cardiologist (physician) and Advanced Practice Providers (APPs -  Physician Assistants and Nurse Practitioners) who all work together to provide you with the care you need, when you need it.  We recommend signing up for the patient portal called "MyChart".  Sign up  information is provided on this After Visit Summary.  MyChart is used to connect with patients for Virtual Visits (Telemedicine).  Patients are able to view lab/test results, encounter notes, upcoming appointments, etc.  Non-urgent messages can be sent to your provider as well.   To learn more about what you can do with MyChart, go to [email or web address].    Your next appointment:   3-4 week(s) (after echo complete)  Provider:   Sherryl Manges, MD   Christena Deem- Long Term Monitor Instructions  Your physician has requested you wear a ZIO patch monitor for 14 days.  This is a single patch monitor. [employer] supplies one patch monitor per enrollment. Additional stickers are not available. Please do not apply patch if you will be having a Nuclear Stress Test,  Echocardiogram, Cardiac CT, MRI, or Chest Xray during the period you would be wearing the  monitor. The patch cannot be worn during these tests. You cannot remove and re-apply the  ZIO XT patch monitor.  Your ZIO patch monitor will be mailed 3 day USPS to your address on file. It may take 3-5 days  to receive your monitor after you have been enrolled.  Once you have received your monitor, please review the enclosed instructions. Your monitor  has already been registered assigning a specific monitor serial # to you.  Billing and Patient Assistance Program Information  We have supplied [employer] with any of your insurance information on file for billing purposes. [employer] offers a sliding scale Patient Assistance Program for patients that do not have  insurance, or whose insurance does not completely cover the cost of the ZIO monitor.  You must apply for the  Patient Assistance Program to qualify for this discounted rate.  To apply, please call [employer] at [phone], select option 4, select option 2, ask to apply for  Patient Assistance Program. Meredeth Ide will ask your household income, and how many people  are in your household. They will quote  your out-of-pocket cost based on that information.  [employer] will also be able to set up a 83-month, interest-free payment plan if needed.  Applying the monitor   Shave hair from upper left chest.  Hold abrader disc by orange tab. Rub abrader in 40 strokes over the upper left chest as  indicated in your monitor instructions.  Clean area with 4 enclosed alcohol pads. Let dry.  Apply patch as indicated in monitor instructions. Patch will be placed under collarbone on left  side of chest with arrow pointing upward.  Rub patch adhesive wings for 2 minutes. Remove white label marked "1". Remove the white  label marked "2". Rub patch adhesive wings for 2 additional minutes.  While looking in a mirror, press and release button in center of patch. A small green light will  flash 3-4 times. This will be your only indicator that the monitor has been turned on.  Do not shower for the first 24 hours. You may shower after the first 24 hours.  Press the button if you feel a symptom. You will hear a small click. Record Date, Time and  Symptom in the Patient Logbook.  When you are ready to remove the patch, follow instructions on the last 2 pages of Patient  Logbook. Stick patch monitor onto the last page of Patient Logbook.  Place Patient Logbook in the blue and white box. Use locking tab on box and tape box closed  securely. The blue and white box has prepaid postage on it. Please place it in the mailbox as  soon as possible. Your physician should have your test results approximately 7 days after the  monitor has been mailed back to [facility].  Call [facility] Customer Care at [phone] if you have questions regarding  your ZIO XT patch monitor. Call them immediately if you see an orange light blinking on your  monitor.  If your monitor falls off in less than 4 days, contact our Monitor department at [phone].  If your monitor becomes loose or falls off after 4 days call [employer] at  [phone] for  suggestions on securing your monitor

## 2022-08-08 NOTE — Telephone Encounter (Signed)
3 to 4 weeks follow up, patient ask to call after the echo and monitor and she said she iwll call for appointment

## 2022-08-08 NOTE — Progress Notes (Signed)
Patient Care Team: Alba Cory, MD as PCP - General (Family Medicine) Debbe Odea, MD as PCP - Cardiology (Cardiology) Wyline Mood, MD as Consulting Physician (Gastroenterology) Salena Saner, MD as Consulting Physician (Pulmonary Disease) Lonell Face, MD as Consulting Physician (Neurology) Dayna Barker, MD as Referring Physician (Internal Medicine)   HPI  [name] Note Belin is a 52 y.o. female Who suffered a cryptogenic stroke 2018 imaging demonstrated small cerebellar and occipital infarcts concerning for embolic origin; she underwent loop recorder implantation (WC)  hypercoagulable work-up was negative.  She ended up with a TEE with question of a interatrial septal aneurysm.  Bubble study was negative.  Because of equivocal results, she underwent TCD which was negative Recurrent stroke 1/22  w/u neg  Seen by Dr. Corrin Parker 7/22 with notes complaining of orthostasis and inappropriate tachycardia.  Antecedent history of COVID-pneumonia 12/21 managed with remdesivir; symptoms however a to date COVID.  Long history of shower intolerance, heat intolerance, orthostatic intolerance, exercise intolerance with tachypalpitations.  He started dig; given history of hypotension and orthostatic intolerance    Data below identifies her mean heart rate over the last 5 years has been about 100; previously had been on beta-blockers       Orthostatic lightheadedness is much improved on the ProAmatine.  T   History of PVCs.  Monitor 1/23 showed less than 1%.  Worsening dyspnea.  6/23 Event recorder >>HR of 66 bpm, max HR of 154 bpm, and avg HR of 95; freq PVCs 7.8%  Interval hospitalization for recurrent stroke 7/22 markedly hypertensive at 204/122 and given TNK without neurological residual.  Treated with Plavix; heart rates on telemetry during the hospital demonstrated sinus rhythm at about 75 or 80 with frequent PVCs  Interval trip to the ER 4/24 for chest pain and  shortness of breath  Over recent months she has had an increasing frequency of "spells "she calls them syncope.  Describes them as a 15 to 30-minute gradual but progressive onset of an inability to stay awake.  When the episode is over she feels refreshed.  When she has been having these spells and checking her blood pressure is within about 10 mm of normal i.e. about 100.  Descriptions by her husband do not include that she is pale.  She has noted palpitations at the onset of these.  Less aware of the palpitations as the event unfolds.  Also frightened about cardiac arrest  DATE TEST EF   11/18 Echo   55-60 %   2/22 TEE    4/23 Myoview  80% No Ischemia "Attenuation images-no evidence of coronary calcifications  6/23 Echo  60-65%    Date Cr K Hgb DIG  9/22 1.01 4.4 15.0   1.9 (12/22)  4/24  0.98 4.0 13.6     Records and Results Reviewed  Past Medical History:  Diagnosis Date   Anemia    Anxiety    Depression    Dyspnea    SINCE STROKE   GERD (gastroesophageal reflux disease)    Hyperlipidemia    Hypertension    IBS (irritable bowel syndrome)    Migraine    Previous cesarean delivery, delivered, with or without mention of antepartum condition 01/19/2012   Psychogenic nonepileptic seizure    hx/notes 12/19/2016-LAST SEIZURE IN 2019-ON NO MEDS AS OF 05-24-19   Restless leg syndrome    Sleep apnea    USES CPAP   Stroke (HCC) 12/18/2016   Acute arterial ischemic stroke, multifocal,  posterior circulation /notes 12/19/2016-LEFT SIDED WEAKNESS, MEMORY FATIGUE AND TROUBLE FINDING HER WORDS [facility]    Past Surgical History:  Procedure Laterality Date   ABDOMINOPLASTY  Feb. 2015   CESAREAN SECTION  2011   CHOLECYSTECTOMY N/A 05/31/2019   Procedure: LAPAROSCOPIC CHOLECYSTECTOMY WITH INTRAOPERATIVE CHOLANGIOGRAM;  Surgeon: Earline Mayotte, MD;  Location: [facility] ORS;  Service: General;  Laterality: N/A;   COLONOSCOPY WITH PROPOFOL N/A 11/26/2016   Procedure: COLONOSCOPY WITH PROPOFOL;   Surgeon: Wyline Mood, MD;  Location[facility] ENDOSCOPY;  Service: Gastroenterology;  Laterality: N/A;   COLONOSCOPY WITH PROPOFOL N/A 08/08/2020   Procedure: COLONOSCOPY WITH PROPOFOL;  Surgeon: Regis Bill, MD;  Location: [facility] ENDOSCOPY;  Service: Endoscopy;  Laterality: N/A;   CYSTOSCOPY  02/02/2015   Procedure: CYSTOSCOPY;  Surgeon: Nadara Mustard, MD;  Location: [facility] ORS;  Service: Gynecology;;   DILATION AND CURETTAGE OF UTERUS  2003, 2005, 2008   ESOPHAGOGASTRODUODENOSCOPY N/A 05/31/2019   Procedure: ESOPHAGOGASTRODUODENOSCOPY (EGD);  Surgeon: Earline Mayotte, MD;  Location: [facility] ORS;  Service: General;  Laterality: N/A;  in the O.R.   ESOPHAGOGASTRODUODENOSCOPY (EGD) WITH PROPOFOL N/A 11/26/2016   Procedure: ESOPHAGOGASTRODUODENOSCOPY (EGD) WITH PROPOFOL;  Surgeon: Wyline Mood, MD;  Location: [facility] ENDOSCOPY;  Service: Gastroenterology;  Laterality: N/A;   HERNIA REPAIR  1999   Umbilical   KNEE ARTHROSCOPY Right 2006   LAPAROSCOPIC BILATERAL SALPINGECTOMY Bilateral 02/02/2015   Procedure: LAPAROSCOPIC BILATERAL SALPINGECTOMY;  Surgeon: Nadara Mustard, MD;  Location: [facility] ORS;  Service: Gynecology;  Laterality: Bilateral;   LAPAROSCOPIC HYSTERECTOMY N/A 02/02/2015   Procedure: HYSTERECTOMY TOTAL LAPAROSCOPIC;  Surgeon: Nadara Mustard, MD;  Location: [facility] ORS;  Service: Gynecology;  Laterality: N/A;   LOOP RECORDER INSERTION N/A 12/20/2016   Procedure: LOOP RECORDER INSERTION;  Surgeon: Regan Lemming, MD;  Location: [facility] INVASIVE CV LAB;  Service: Cardiovascular;  Laterality: N/A;   REDUCTION MAMMAPLASTY Bilateral December 2015   TEE WITHOUT CARDIOVERSION N/A 12/20/2016   Procedure: TRANSESOPHAGEAL ECHOCARDIOGRAM (TEE);  Surgeon: Lars Masson, MD;  Location: [facility] ENDOSCOPY;  Service: Cardiovascular;  Laterality: N/A;   TEE WITHOUT CARDIOVERSION N/A 03/29/2020   Procedure: TRANSESOPHAGEAL ECHOCARDIOGRAM (TEE);  Surgeon: Debbe Odea, MD;  Location: [facility] ORS;  Service:  Cardiovascular;  Laterality: N/A;    Current Meds  Medication Sig   ALPRAZolam (XANAX XR) 1 MG 24 hr tablet Take 1 tablet (1 mg total) by mouth every morning.   clopidogrel (PLAVIX) 75 MG tablet Take 75 mg by mouth daily.   cyclobenzaprine (FLEXERIL) 10 MG tablet Take 1 tablet (10 mg total) by mouth at bedtime.   fluticasone furoate-vilanterol (BREO ELLIPTA) 100-25 MCG/ACT AEPB Inhale 1 puff into the lungs daily.   gabapentin (NEURONTIN) 100 MG capsule Take 100 mg by mouth 3 (three) times daily.   hydrocortisone 2.5 % cream Apply topically.   lamoTRIgine (LAMICTAL) 100 MG tablet Take 100 mg by mouth 2 (two) times daily.   midodrine (PROAMATINE) 5 MG tablet Take 1 tablet (5 mg) by mouth three times a day at 7 am, 11 am, & 3 pm   PARoxetine (PAXIL-CR) 25 MG 24 hr tablet TAKE 2 TABLET BY MOUTH DAILY   sodium chloride 1 g tablet Take 1 tablet (1 g total) by mouth 3 (three) times daily with meals.   UBRELVY 100 MG TABS Take 100 mg by mouth daily as needed (migraine).    Vitamin D, Ergocalciferol, (DRISDOL) 1.25 MG (50000 UNIT) CAPS capsule Take 1 capsule (50,000 Units total) by mouth every 7 (seven) days.  zolpidem (AMBIEN CR) 12.5 MG CR tablet Take 1 tablet (12.5 mg total) by mouth at bedtime as needed for sleep.    Allergies  Allergen Reactions   Azithromycin Diarrhea   Penicillins Hives and Swelling    Has patient had a PCN reaction causing immediate rash, facial/tongue/throat swelling, SOB or lightheadedness with hypotension: Yes Has patient had a PCN reaction causing severe rash involving mucus membranes or skin necrosis: No Has patient had a PCN reaction that required hospitalization No Has patient had a PCN reaction occurring within the last 10 years: No If all of the above answers are "NO", then may proceed with Cephalosporin use.      Review of Systems negative except from HPI and PMH  Physical Exam BP 98/62   Pulse (!) 105   Ht 5\' 8"  (1.727 m)   Wt 209 lb (94.8 kg)   LMP  01/15/2015   SpO2 94%   BMI 31.78 kg/m  Well developed and nourished in no acute distress HENT normal Neck supple with JVP-  flat  Clear Regular rate and rhythm, no murmurs or gallops Abd-soft with active BS No Clubbing cyanosis edema Skin-warm and dry A & Oriented  Grossly normal sensory and motor function  ECG sinsu A 105 15/08/36 PVC frequenst LBBB inferior axis    Assessment and  Plan  Cryptogenic stroke  Aortic septal aneurysm without PFO  Dysautonomia-- inappropriate sinus tachycardia/orthostatic intolerance  PVCs left bundle inferior axis    Remains on clopidogrel and atorvastatin for strokes  Frequent PVCs, about 25% based off her electrocardiogram, this is a significant increase from the 7% on her event recorder.  The fact that they are associated in her mind in part with her spells raises the possibility that they are causally related, we will use an event recorder to both try to clarify in association as well as to understand the burden of the PVCs.  Once we have the above information, we will begin therapy with flecainide, this may be better tolerated as the other medications i.e. calcium blockers and beta-blockers challenging given her low blood pressure  Given the frequency of the PVCs and her limited exercise, we will undertake an echocardiogram to assess left ventricular function  With her atypical chest pain and family history we will undertake a calcium score.  With her rapid heart rate she would not be a candidate for CTA   Blood pressures remain low.  Will continue on ProAmatine.  Blood pressures at home have been in the 110 range.  We discussed about the adjunctive benefit of fludrocortisone but we will keep that in advance   Her PVC burden remains high, but with her LV function remaining normal, would hold off on therapies as we try to deal with the neurological issues first     Sherryl Manges, MD 08/08/2022 12:03 PM

## 2022-08-12 ENCOUNTER — Ambulatory Visit (INDEPENDENT_AMBULATORY_CARE_PROVIDER_SITE_OTHER): Payer: BC Managed Care – PPO

## 2022-08-12 DIAGNOSIS — E538 Deficiency of other specified B group vitamins: Secondary | ICD-10-CM

## 2022-08-12 MED ORDER — CYANOCOBALAMIN 1000 MCG/ML IJ SOLN
1000.0000 ug | Freq: Once | INTRAMUSCULAR | Status: AC
Start: 2022-08-12 — End: 2022-08-12
  Administered 2022-08-12: 1000 ug via INTRAMUSCULAR

## 2022-08-13 ENCOUNTER — Ambulatory Visit
Admission: RE | Admit: 2022-08-13 | Discharge: 2022-08-13 | Disposition: A | Discharge status: Home / Self Care | Payer: BC Managed Care – PPO | Source: Ambulatory Visit | Attending: Internal Medicine | Admitting: Internal Medicine

## 2022-08-13 DIAGNOSIS — R9431 Abnormal electrocardiogram [ECG] [EKG]: Secondary | ICD-10-CM | POA: Insufficient documentation

## 2022-08-15 ENCOUNTER — Ambulatory Visit: Payer: BC Managed Care – PPO | Attending: Internal Medicine

## 2022-08-15 DIAGNOSIS — R9431 Abnormal electrocardiogram [ECG] [EKG]: Secondary | ICD-10-CM

## 2022-08-15 DIAGNOSIS — I493 Ventricular premature depolarization: Secondary | ICD-10-CM | POA: Diagnosis not present

## 2022-08-15 LAB — ECHOCARDIOGRAM COMPLETE
Area-P 1/2: 3.77 cm2
S' Lateral: 3.1 cm

## 2022-09-05 ENCOUNTER — Ambulatory Visit: Payer: BC Managed Care – PPO | Admitting: Pulmonary Disease

## 2022-09-09 DIAGNOSIS — I639 Cerebral infarction, unspecified: Secondary | ICD-10-CM | POA: Insufficient documentation

## 2022-09-09 DIAGNOSIS — I493 Ventricular premature depolarization: Secondary | ICD-10-CM | POA: Insufficient documentation

## 2022-09-13 DIAGNOSIS — R109 Unspecified abdominal pain: Secondary | ICD-10-CM | POA: Diagnosis not present

## 2022-09-13 DIAGNOSIS — K588 Other irritable bowel syndrome: Secondary | ICD-10-CM | POA: Diagnosis not present

## 2022-09-13 DIAGNOSIS — K580 Irritable bowel syndrome with diarrhea: Secondary | ICD-10-CM | POA: Diagnosis not present

## 2022-09-15 ENCOUNTER — Other Ambulatory Visit: Payer: Self-pay

## 2022-09-15 ENCOUNTER — Emergency Department: Payer: BC Managed Care – PPO

## 2022-09-15 ENCOUNTER — Emergency Department
Admission: EM | Admit: 2022-09-15 | Discharge: 2022-09-15 | Disposition: A | Discharge status: Home / Self Care | Payer: BC Managed Care – PPO | Attending: Emergency Medicine | Admitting: Emergency Medicine

## 2022-09-15 ENCOUNTER — Encounter: Payer: Self-pay | Admitting: Emergency Medicine

## 2022-09-15 DIAGNOSIS — R0602 Shortness of breath: Secondary | ICD-10-CM | POA: Insufficient documentation

## 2022-09-15 DIAGNOSIS — R42 Dizziness and giddiness: Secondary | ICD-10-CM | POA: Diagnosis not present

## 2022-09-15 DIAGNOSIS — R000 Tachycardia, unspecified: Secondary | ICD-10-CM | POA: Insufficient documentation

## 2022-09-15 DIAGNOSIS — R079 Chest pain, unspecified: Secondary | ICD-10-CM | POA: Diagnosis not present

## 2022-09-15 DIAGNOSIS — R0789 Other chest pain: Secondary | ICD-10-CM | POA: Diagnosis not present

## 2022-09-15 DIAGNOSIS — T63461A Toxic effect of venom of wasps, accidental (unintentional), initial encounter: Secondary | ICD-10-CM | POA: Diagnosis not present

## 2022-09-15 LAB — CBC
HCT: 40.5 % (ref 36.0–46.0)
Hemoglobin: 14.2 g/dL (ref 12.0–15.0)
MCH: 31.4 pg (ref 26.0–34.0)
MCHC: 35.1 g/dL (ref 30.0–36.0)
MCV: 89.6 fL (ref 80.0–100.0)
Platelets: 270 10*3/uL (ref 150–400)
RBC: 4.52 MIL/uL (ref 3.87–5.11)
RDW: 12.5 % (ref 11.5–15.5)
WBC: 8.7 10*3/uL (ref 4.0–10.5)
nRBC: 0 % (ref 0.0–0.2)

## 2022-09-15 LAB — TROPONIN I (HIGH SENSITIVITY): Troponin I (High Sensitivity): 5 ng/L (ref <18)

## 2022-09-15 LAB — BASIC METABOLIC PANEL
Anion gap: 9 (ref 5–15)
BUN: 20 mg/dL (ref 6–20)
CO2: 24 mmol/L (ref 22–32)
Calcium: 9 mg/dL (ref 8.9–10.3)
Chloride: 105 mmol/L (ref 98–111)
Creatinine, Ser: 1.2 mg/dL — ABNORMAL HIGH (ref 0.44–1.00)
GFR, Estimated: 55 mL/min — ABNORMAL LOW (ref >60)
Glucose, Bld: 131 mg/dL — ABNORMAL HIGH (ref 70–99)
Potassium: 3.7 mmol/L (ref 3.5–5.1)
Sodium: 138 mmol/L (ref 135–145)

## 2022-09-15 MED ORDER — HYDROCODONE-ACETAMINOPHEN 5-325 MG PO TABS: 1.0000 | ORAL_TABLET | Freq: Four times a day (QID) | ORAL | 0 refills | Status: AC | PRN

## 2022-09-15 MED ORDER — FENTANYL CITRATE PF 50 MCG/ML IJ SOSY
50.0000 ug | PREFILLED_SYRINGE | Freq: Once | INTRAMUSCULAR | Status: AC
  Administered 2022-09-15: 50 ug via INTRAVENOUS
  Filled 2022-09-15: qty 1

## 2022-09-15 NOTE — ED Notes (Signed)
Discussed pt arrival with assigned RN. Unable to obtain IV and labs. Pt on VS monitor. Husband at bedside

## 2022-09-15 NOTE — Discharge Instructions (Addendum)
You were seen in the ER today for your yellowjacket stings and chest pain.  Fortunately your testing was reassuring.  I sent a short course of pain medicine to your pharmacy that you can take as needed.  Do not drive or operate machinery when taking this.  Return to the ER for new or worsening symptoms.

## 2022-09-15 NOTE — ED Triage Notes (Signed)
Pt BIB via POV accompanied by husband d/t concerns following bites from several yellow jackets around 6 pm yesterday 7/27. Sts concern for centralized chest pain that started approx 1 hour ago. CP associated with SOB, feeling dizzy, and breaking out into a sweat.

## 2022-09-15 NOTE — ED Provider Notes (Signed)
[facility] Provider Note    Event Date/Time   First MD Initiated Contact with Patient 09/15/22 0503     (approximate)   History   Insect Bite and Chest Pain   HPI  Alicia [name] Note Whan is a 52 y.o. female presenting to the emergency department for evaluation of chest pain.  Around 6 PM yesterday, patient was stung multiple times by several yellow jackets.  Has bites over her leg and thigh.  Shortly after in the setting of her pain, she began to develop centralized chest pain.  Does report associated shortness of breath and dizziness.  No difficulty breathing, hives, vomiting.     Physical Exam   Triage Vital Signs: ED Triage Vitals  Encounter Vitals Group     BP 09/15/22 0404 131/81     Systolic BP Percentile --      Diastolic BP Percentile --      Pulse Rate 09/15/22 0404 (!) 122     Resp 09/15/22 0404 20     Temp 09/15/22 0404 98 F (36.7 C)     Temp Source 09/15/22 0404 Oral     SpO2 09/15/22 0404 100 %     Weight 09/15/22 0404 199 lb (90.3 kg)     Height 09/15/22 0404 5\' 8"  (1.727 m)     Head Circumference --      Peak Flow --      Pain Score 09/15/22 0411 2     Pain Loc --      Pain Education --      Exclude from Growth Chart --     Most recent vital signs: Vitals:   09/15/22 0615 09/15/22 0630  BP:  (!) 109/47  Pulse: 96 93  Resp: 15 16  Temp:    SpO2: 94% 93%     General: Awake, interactive  CV:  Regular rate, good peripheral perfusion.  Resp:  Lungs clear, unlabored respirations.  Abd:  Soft, nondistended.  Neuro:  Symmetric facial movement, fluid speech Skin:  Multiple papular lesions some with surrounding erythema consistent with sting marks.  Moving all extremities spontaneously with intact sensation.   ED Results / Procedures / Treatments   Labs (all labs ordered are listed, but only abnormal results are displayed) Labs Reviewed  BASIC METABOLIC PANEL - Abnormal; Notable for the following components:       Result Value   Glucose, Bld 131 (*)    Creatinine, Ser 1.20 (*)    GFR, Estimated 55 (*)    All other components within normal limits  CBC  TROPONIN I (HIGH SENSITIVITY)     EKG EKG independently reviewed interpreted by myself (ER attending) demonstrates:  EKG demonstrates sinus tachycardia at a rate of 103, PR 141, QRS 97, QTc 476, no acute ST changes  RADIOLOGY Imaging independently reviewed and interpreted by myself demonstrates:  Chest x-Aamira Bischoff without focal consolidation or evidence of pneumothorax  PROCEDURES:  Critical Care performed: No  Procedures   MEDICATIONS ORDERED IN ED: Medications  fentaNYL (SUBLIMAZE) injection 50 mcg (50 mcg Intravenous Given 09/15/22 0556)     IMPRESSION / MDM / ASSESSMENT AND PLAN / ED COURSE  I reviewed the triage vital signs and the nursing notes.  Differential diagnosis includes, but is not limited to, stress response in the setting of acute yellowjacket stings, clinical presentation not consistent with anaphylaxis or severe allergic reaction, consideration for pneumonia, pneumothorax, very low suspicion ACS, PE  Patient's presentation is most consistent with acute presentation  with potential threat to life or bodily function.  52 year old female presenting with chest pain after being stung multiple times by yellow jackets.  Labs sent from triage without severe derangement.  Troponin negative with several hours of pain.  Chest x-Ainslee Sou without acute abnormality.  Patient was treated symptomatically with fentanyl.  On reevaluation, she reports significant improvement.  Overall low suspicion for significant acute etiology of her chest pain.  Do think she is stable for discharge home.  Patient discharged stable condition with strict return precautions.      FINAL CLINICAL IMPRESSION(S) / ED DIAGNOSES   Final diagnoses:  Acute chest pain  Yellow jacket sting, accidental or unintentional, initial encounter     Rx / DC Orders   ED  Discharge Orders     None        Note:  This document was prepared using Dragon voice recognition software and may include unintentional dictation errors.   Trinna Post, MD 09/15/22 [phone]

## 2022-09-15 NOTE — ED Notes (Signed)
Attempted IV x2 without success. Pt to assigned room for continued care

## 2022-09-19 ENCOUNTER — Encounter: Payer: Self-pay | Admitting: Internal Medicine

## 2022-09-19 ENCOUNTER — Ambulatory Visit: Payer: BC Managed Care – PPO | Attending: Internal Medicine | Admitting: Internal Medicine

## 2022-09-19 DIAGNOSIS — I493 Ventricular premature depolarization: Secondary | ICD-10-CM

## 2022-09-19 DIAGNOSIS — I951 Orthostatic hypotension: Secondary | ICD-10-CM

## 2022-09-19 DIAGNOSIS — I639 Cerebral infarction, unspecified: Secondary | ICD-10-CM

## 2022-09-19 DIAGNOSIS — G901 Familial dysautonomia [Riley-Day]: Secondary | ICD-10-CM

## 2022-09-30 ENCOUNTER — Other Ambulatory Visit: Payer: Self-pay | Admitting: Family Medicine

## 2022-09-30 DIAGNOSIS — F411 Generalized anxiety disorder: Secondary | ICD-10-CM

## 2022-10-01 ENCOUNTER — Other Ambulatory Visit: Payer: Self-pay | Admitting: *Deleted

## 2022-10-02 ENCOUNTER — Encounter: Payer: Self-pay | Admitting: Family Medicine

## 2022-10-02 ENCOUNTER — Other Ambulatory Visit (HOSPITAL_COMMUNITY)
Admission: RE | Admit: 2022-10-02 | Discharge: 2022-10-02 | Disposition: A | Discharge status: Home / Self Care | Payer: BC Managed Care – PPO | Source: Ambulatory Visit | Attending: Family Medicine | Admitting: Family Medicine

## 2022-10-02 ENCOUNTER — Ambulatory Visit: Payer: BC Managed Care – PPO | Admitting: Family Medicine

## 2022-10-02 VITALS — BP 118/70 | HR 91 | Resp 16 | Ht 68.0 in | Wt 209.0 lb

## 2022-10-02 DIAGNOSIS — Z79899 Other long term (current) drug therapy: Secondary | ICD-10-CM | POA: Diagnosis not present

## 2022-10-02 DIAGNOSIS — I639 Cerebral infarction, unspecified: Secondary | ICD-10-CM

## 2022-10-02 DIAGNOSIS — E538 Deficiency of other specified B group vitamins: Secondary | ICD-10-CM

## 2022-10-02 DIAGNOSIS — N898 Other specified noninflammatory disorders of vagina: Secondary | ICD-10-CM | POA: Diagnosis not present

## 2022-10-02 DIAGNOSIS — E785 Hyperlipidemia, unspecified: Secondary | ICD-10-CM

## 2022-10-02 DIAGNOSIS — R7303 Prediabetes: Secondary | ICD-10-CM

## 2022-10-02 DIAGNOSIS — E559 Vitamin D deficiency, unspecified: Secondary | ICD-10-CM

## 2022-10-02 DIAGNOSIS — F5104 Psychophysiologic insomnia: Secondary | ICD-10-CM

## 2022-10-02 DIAGNOSIS — F330 Major depressive disorder, recurrent, mild: Secondary | ICD-10-CM | POA: Diagnosis not present

## 2022-10-02 DIAGNOSIS — G4733 Obstructive sleep apnea (adult) (pediatric): Secondary | ICD-10-CM

## 2022-10-02 DIAGNOSIS — G901 Familial dysautonomia [Riley-Day]: Secondary | ICD-10-CM

## 2022-10-02 DIAGNOSIS — F411 Generalized anxiety disorder: Secondary | ICD-10-CM

## 2022-10-02 DIAGNOSIS — G43009 Migraine without aura, not intractable, without status migrainosus: Secondary | ICD-10-CM

## 2022-10-02 MED ORDER — MIDODRINE HCL 5 MG PO TABS: ORAL_TABLET | ORAL | 0 refills | Status: DC

## 2022-10-02 MED ORDER — ZOLPIDEM TARTRATE ER 12.5 MG PO TBCR
12.5000 mg | EXTENDED_RELEASE_TABLET | Freq: Every evening | ORAL | 0 refills | Status: DC | PRN
Start: 2022-10-02 — End: 2022-12-24

## 2022-10-02 MED ORDER — ALPRAZOLAM ER 1 MG PO TB24
1.0000 mg | ORAL_TABLET | ORAL | 0 refills | Status: DC
Start: 2022-10-02 — End: 2022-12-24

## 2022-10-02 NOTE — Progress Notes (Signed)
Name: Alicia [name] Note Frome   MRN: [id number]    DOB: Sep 22, 1970   Date:10/02/2022       Progress Note  Subjective  Chief Complaint  Follow Up  HPI  CVA with hemiparesis : 12/18/2016, admitted to [facility] , mild distal left PICA. She is feeling better, but still has lack of balance but not using a cane, episodes of dysarthria, continues to have tremors, and has some left side weakness .  She still has some short term memory difficulties and difficulty finding the correct words  to express herself at times.  She had at least 4 seizure like activities first one in 2018 ,  09/2017, had one in April after surgery 05/2019, she states had a similar episode Fall 2022 .She states when at work and super tired she falls asleep on her desk. She feels very sleepy and lethargic and has to put her head down, it can last 8-9 minutes and when she wakes up she is able to function. At times she has episodes of nausea, gets confused and has to rest She  is taking Crestor. She recently went to [facility] - Dr. Gevena Cotton  02/2021 , had repeat MRI and advised to stop aspirin and start Plavix  since studies showed not responsive to aspirin, there is still a concern for mild FMD in her cervical ICA and vertebral arteries. Possible PFO but cardiac MRI negative for cardio embolic source. She had another episode of confusion and left side weakness on 07/23  but CTA, MRI and CT all negative except for old lacunar infarct - she states still feeling weak on left side and some dysarthria , she has mental fogginess. 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   Summary of transcranial doppler 05/30/2020 No HITS at rest or during Valsalva. Negative transcranial Doppler Bubble study with no evidence of right to left intracardiac communication. A vascular evaluation was performed. The right  middle cerebral artery was studied. An IV was inserted into the patient's left antecubital. Verbal informed consent was obtained. Negative TCD Bubble study  Cardiac MRI heart: 12/11/2020   Normal EF, unremarkable  Migraine : She is under the care of Dr. Sherryll Burger. Currently on Lamictal and also prn Ubrelvy, episodes are up again once or twice a week again, states worries about what is going on in the world. She misses work about 4 hours q 2 weeks due to migraine headaches. She states described as a band around her head and associated with phonophobia and photophobia. Stable   Orthostatic hypotension with tachycardia/Dysautonomia :   and had  TEE for evaluation of TIA's and strokes to rule out foramen ovale - so far no structural heart disease found. She is still taking ProAmatine , she also has frequent PVC's. Symptoms worse when increase in stress. She has a need to fall asleep and unable to stay awake, she has to put her head until she wakes up on her own, work is allowing her at this time to close her door and take a nap when that happens   Long haul COVID-19: diagnosed 02/04/2020, she continues to have fatigue and SOB with activity . She already had some brain fogginess before COVID-19 but feels worse since COVID , she also has intermittent chest tightness COVID-19 , she had a second episode of COVID-05 Feb 2021. She feels like myalgia and fatigues , she sees a psyiatrist and is taking gabapentin that helps with radiculitis symptoms but not with joint and muscle pain    GAD/MDD : Husband died suddenly of a brain aneurysm back in  October 23 rd, 2016.  She has been taking her antidepressant medication and Alprazolam as prescribed.  She remarried her high school sweet heart 06/07/2016 ( and he moved here from [place])  Had a stroke 12/2016. We adjusted Paxil  dose from 40 mg to 60 mg Fall of 2020 and symptoms were controlled, we switched sustained release 37.5  but she noticed it was not working as well so  she added 20 mg immediate release. Insurance denied paying for two separate formulations and is currently taking two  25 CR per day. She still avoid crowds, work still allows her to not participate on larger meetings. Accomodation are in place at work   Hypertriglyceridemia: currently off Vascepa due to cost and nausea, she also stopped taking Crestor but states it was causing her headaches and muscle pain. LDL during hospital stay was up to 86, HDL 42 , triglycerides was up.    Pre-diabetes : last A1C was 6 % back in July 2023  She denies polyphagia, polydipsia or polyuria. She is trying to cut down on carbohydrates   Insomnia: taking Ambien and is taking her less than 30 minutes to fall asleep .She is aware of long term risk of Ambien, but states cannot sleep without it. She also knows that the dose she is taking is not FDA approved for females. She states medication is working now, when tired enough able to fall and stay asleep. We tried going down on flexeril because of compounding sedative effect, but neck pain and headaches got worse, she was unable to go down on Flexeril but lower dose did not work for neck spasms. She states she has a good bedtime routine, stops screen 30 minutes before time and chats with her husband before bed. Symptoms are stable     Neck and back pain: currently seeing psychiatrist , taking Flexeril , still cannot afford PT. She had a positive HLA B27. She is now also taking gabapentin 100 mg in am, at lunch and 300 mg at night. She states medication seems to help with pain, symptoms are stable . Unchanged  Thyroid nodule:  also has low T3 but normal TSH - done recently  IMPRESSION: 12/2019  1. Mildly heterogeneous but otherwise unremarkable thyroid gland without evidence of nodule. 2. The palpable abnormality corresponds with a small 0.6 cm lymph node. Favor reactive lymphadenopathy. Recommend continued clinical surveillance. If there is evidence of growth over time,  repeat imaging and potentially biopsy may become warranted.  GERD: she had an esophogram, barium swallow done on 05/19/2019 that showed mild GERD, she was able to stop PPI and is doing well, still only taking Tums prn   OSA: wearing CPAP every night now  Doing well    Reactive Airway Dysfunction syndrome: she saw Jana Half, pulmonologist, advised to stop Symbicort , take prn albuterol and is now on Breo . She had COVID Dec 2021 and again Dec 2022 - she continues to have intermittent SOB, usually triggered by activity She has been back on [place] and doing well .   Leukocytoclastic vasculitis: based on skin biopsy done by Dr. Gwen Pounds, multiple labs done and negative, she saw  Dr. Renard Matter in the past and is under the care of Dr. Allena Katz  first visit in early 2024 . No active lesions on skin  B12 deficiency: getting monthly injections , we will recheck level today   Throat dryness : going on for a few weeks, no fever or chills, also had some blood on the left ear for a few days, no pain associated with it   Vaginal odor: going on for months   Patient Active Problem List   Diagnosis Date Noted   Cryptogenic stroke (HCC) 09/09/2022   PVC's (premature ventricular contractions) 09/09/2022   Dysautonomia (HCC) 04/03/2022   Hemiparesis of left nondominant side as late effect of cerebral infarction (HCC) 04/03/2022   Mild episode of recurrent major depressive disorder (HCC) 04/03/2022   HLA B27 (HLA B27 positive) 04/03/2022   Essential hypertension 08/30/2021   COVID-19 long hauler manifesting chronic fatigue 03/21/2020   Orthostatic hypotension 02/23/2020   Atypical chest pain 02/23/2020   Reactive airways dysfunction syndrome (HCC) 10/15/2019   Hyperfunction of pituitary gland, unspecified (HCC) 10/15/2019   Calculus of gallbladder without cholecystitis without obstruction 05/06/2019   Cyst of pancreas 05/06/2019   Right kidney stone 05/06/2019   Leukocytoclastic vasculitis (HCC) 10/07/2018    Chronic insomnia 02/09/2018   History of anemia 02/09/2018   Sleep apnea 09/24/2017   History of ischemic stroke 05/30/2017   Hyperlipidemia 05/30/2017   Migraine without aura and without status migrainosus, not intractable 05/27/2017   Pain in right knee 01/21/2017   Psychogenic nonepileptic seizure    Chronic pain syndrome    Restless leg syndrome    Chronic prescription benzodiazepine use 01/20/2016   Leukocytosis 01/20/2016   Seizure-like activity (HCC)    GAD (generalized anxiety disorder) 02/14/2015   Chronic neck pain 02/14/2015   History of hysterectomy 02/14/2015   Iron deficiency anemia due to chronic blood loss 08/29/2014   Insomnia, persistent 08/14/2014   Major depression (HCC) 08/14/2014   Temporomandibular joint sounds on opening and/or closing the jaw 08/14/2014   Degenerative disc disease, lumbar 08/14/2014   Bleeding internal hemorrhoids 08/14/2014   Gastric reflux 08/14/2014   Irritable bowel syndrome with constipation 08/14/2014   Hypertriglyceridemia 08/14/2014   Overweight 08/14/2014   Tinnitus 08/14/2014   Vitamin D deficiency 08/14/2014   Sinus tachycardia 11/25/2012   DOE (dyspnea on exertion) 11/06/2012    Past Surgical History:  Procedure Laterality Date   ABDOMINOPLASTY  Feb. 2015   CESAREAN SECTION  2011   CHOLECYSTECTOMY N/A 05/31/2019   Procedure: LAPAROSCOPIC CHOLECYSTECTOMY WITH INTRAOPERATIVE CHOLANGIOGRAM;  Surgeon: Earline Mayotte, MD;  Location: [facility] ORS;  Service: General;  Laterality: N/A;   COLONOSCOPY WITH PROPOFOL N/A 11/26/2016   Procedure: COLONOSCOPY WITH PROPOFOL;  Surgeon: Wyline Mood, MD;  Location: [facility] ENDOSCOPY;  Service: Gastroenterology;  Laterality: N/A;   COLONOSCOPY WITH PROPOFOL N/A 08/08/2020   Procedure: COLONOSCOPY WITH PROPOFOL;  Surgeon: Regis Bill, MD;  Location: [facility] ENDOSCOPY;  Service: Endoscopy;  Laterality: N/A;   CYSTOSCOPY  02/02/2015   Procedure: CYSTOSCOPY;  Surgeon: Nadara Mustard, MD;   Location: [facility] ORS;  Service: Gynecology;;   DILATION AND CURETTAGE OF UTERUS  2003, 2005, 2008   ESOPHAGOGASTRODUODENOSCOPY N/A 05/31/2019   Procedure: ESOPHAGOGASTRODUODENOSCOPY (EGD);  Surgeon: Earline Mayotte, MD;  Location: [facility]S;  Service: General;  Laterality: N/A;  in the O.R.   ESOPHAGOGASTRODUODENOSCOPY (EGD) WITH PROPOFOL N/A 11/26/2016   Procedure: ESOPHAGOGASTRODUODENOSCOPY (EGD) WITH PROPOFOL;  Surgeon: Wyline Mood, MD;  Location: [facility] ENDOSCOPY;  Service: Gastroenterology;  Laterality: N/A;   HERNIA REPAIR  1999   Umbilical  KNEE ARTHROSCOPY Right 2006   LAPAROSCOPIC BILATERAL SALPINGECTOMY Bilateral 02/02/2015   Procedure: LAPAROSCOPIC BILATERAL SALPINGECTOMY;  Surgeon: Nadara Mustard, MD;  Location: [facility] ORS;  Service: Gynecology;  Laterality: Bilateral;   LAPAROSCOPIC HYSTERECTOMY N/A 02/02/2015   Procedure: HYSTERECTOMY TOTAL LAPAROSCOPIC;  Surgeon: Nadara Mustard, MD;  Location: [facility] ORS;  Service: Gynecology;  Laterality: N/A;   LOOP RECORDER INSERTION N/A 12/20/2016   Procedure: LOOP RECORDER INSERTION;  Surgeon: Regan Lemming, MD;  Location: [facility];  Service: Cardiovascular;  Laterality: N/A;   REDUCTION MAMMAPLASTY Bilateral December 2015   TEE WITHOUT CARDIOVERSION N/A 12/20/2016   Procedure: TRANSESOPHAGEAL ECHOCARDIOGRAM (TEE);  Surgeon: Lars Masson, MD;  Location: [facility] ENDOSCOPY;  Service: Cardiovascular;  Laterality: N/A;   TEE WITHOUT CARDIOVERSION N/A 03/29/2020   Procedure: TRANSESOPHAGEAL ECHOCARDIOGRAM (TEE);  Surgeon: Debbe Odea, MD;  Location: [facility] ORS;  Service: Cardiovascular;  Laterality: N/A;    Family History  Problem Relation Age of Onset   Hypertension Mother    Hyperlipidemia Mother    Heart Problems Father        hole in heart and lower ventricles reversed   Prostate cancer Maternal Grandfather    Von Willebrand disease Maternal Uncle     Social History   Tobacco Use   Smoking status: Never   Smokeless  tobacco: Never  Substance Use Topics   Alcohol use: No    Alcohol/week: 0.0 standard drinks of alcohol     Current Outpatient Medications:    ALPRAZolam (XANAX XR) 1 MG 24 hr tablet, Take 1 tablet (1 mg total) by mouth every morning., Disp: 90 tablet, Rfl: 0   clopidogrel (PLAVIX) 75 MG tablet, Take 75 mg by mouth daily., Disp: , Rfl:    cyclobenzaprine (FLEXERIL) 10 MG tablet, Take 1 tablet (10 mg total) by mouth at bedtime., Disp: 90 tablet, Rfl: 1   fluticasone furoate-vilanterol (BREO ELLIPTA) 100-25 MCG/ACT AEPB, Inhale 1 puff into the lungs daily., Disp: 180 each, Rfl: 1   gabapentin (NEURONTIN) 100 MG capsule, Take 100 mg by mouth 3 (three) times daily., Disp: , Rfl:    hydrocortisone 2.5 % cream, Apply topically., Disp: , Rfl:    lamoTRIgine (LAMICTAL) 100 MG tablet, Take 100 mg by mouth 2 (two) times daily., Disp: , Rfl:    midodrine (PROAMATINE) 5 MG tablet, Take 1 tablet (5 mg) by mouth three times a day at 7 am, 11 am, & 3 pm, Disp: 270 tablet, Rfl: 0   PARoxetine (PAXIL-CR) 25 MG 24 hr tablet, TAKE 2 TABLET BY MOUTH DAILY, Disp: 180 tablet, Rfl: 1   sodium chloride 1 g tablet, Take 1 tablet (1 g total) by mouth 3 (three) times daily with meals., Disp: 270 tablet, Rfl: 3   UBRELVY 100 MG TABS, Take 100 mg by mouth daily as needed (migraine). , Disp: , Rfl:    Vitamin D, Ergocalciferol, (DRISDOL) 1.25 MG (50000 UNIT) CAPS capsule, Take 1 capsule (50,000 Units total) by mouth every 7 (seven) days., Disp: 12 capsule, Rfl: 1   zolpidem (AMBIEN CR) 12.5 MG CR tablet, Take 1 tablet (12.5 mg total) by mouth at bedtime as needed for sleep., Disp: 30 tablet, Rfl: 2  Allergies  Allergen Reactions   Azithromycin Diarrhea   Penicillins Hives and Swelling    Has patient had a PCN reaction causing immediate rash, facial/tongue/throat swelling, SOB or lightheadedness with hypotension: Yes Has patient had a PCN reaction causing severe rash involving mucus membranes or skin necrosis: No Has  patient had a PCN reaction that required hospitalization No Has patient had a PCN reaction occurring within the last 10 years: No If all of the above answers are "NO", then may proceed with Cephalosporin use.    I personally reviewed active problem list, medication list, allergies, family history, social history, health maintenance with the patient/caregiver today.   ROS  Ten systems reviewed and is negative except as mentioned in HPI  Objective  Vitals:   10/02/22 0802  BP: 118/70  Pulse: 91  Resp: 16  SpO2: 99%  Weight: 209 lb (94.8 kg)  Height: 5\' 8"  (1.727 m)    Body mass index is 31.78 kg/m.  Physical Exam  Constitutional: Patient appears well-developed and well-nourished. Obese  No distress.  HEENT: head atraumatic, normocephalic, pupils equal and reactive to light, ears normal TM , neck supple, throat within normal limits Cardiovascular: Normal rate, regular rhythm and normal heart sounds.  No murmur heard. No BLE edema. Pulmonary/Chest: Effort normal and breath sounds normal. No respiratory distress. Abdominal: Soft.  There is no tenderness. Psychiatric: Patient has a normal mood and affect. behavior is normal. Judgment and thought content normal.   Recent Results (from the past 2160 hour(s))  ECHOCARDIOGRAM COMPLETE     Status: None   Collection Time: 08/15/22  8:05 AM  Result Value Ref Range   S' Lateral 3.10 cm   Area-P 1/2 3.77 cm2   Est EF 60 - 65%   Basic metabolic panel     Status: Abnormal   Collection Time: 09/15/22  4:48 AM  Result Value Ref Range   Sodium 138 135 - 145 mmol/L   Potassium 3.7 3.5 - 5.1 mmol/L   Chloride 105 98 - 111 mmol/L   CO2 24 22 - 32 mmol/L   Glucose, Bld 131 (H) 70 - 99 mg/dL    Comment: Glucose reference range applies only to samples taken after fasting for at least 8 hours.   BUN 20 6 - 20 mg/dL   Creatinine, Ser 4.09 (H) 0.44 - 1.00 mg/dL   Calcium 9.0 8.9 - 81.1 mg/dL   GFR, Estimated 55 (L) >60 mL/min    Comment:  (NOTE) Calculated using the CKD-EPI Creatinine Equation (2021)    Anion gap 9 5 - 15    Comment: Performed at [facility], [address] [place], [place] [address]  CBC     Status: None   Collection Time: 09/15/22  4:48 AM  Result Value Ref Range   WBC 8.7 4.0 - 10.5 K/uL   RBC 4.52 3.87 - 5.11 MIL/uL   Hemoglobin 14.2 12.0 - 15.0 g/dL   HCT 29.5 62.1 - 30.8 %   MCV 89.6 80.0 - 100.0 fL   MCH 31.4 26.0 - 34.0 pg   MCHC 35.1 30.0 - 36.0 g/dL   RDW 65.7 84.6 - 96.2 %   Platelets 270 150 - 400 K/uL   nRBC 0.0 0.0 - 0.2 %    Comment: Performed at [facility], [address] [place], [place] [address]  Troponin I (High Sensitivity)     Status: None   Collection Time: 09/15/22  4:48 AM  Result Value Ref Range   Troponin I (High Sensitivity) 5 <18 ng/L    Comment: (NOTE) Elevated high sensitivity troponin I (hsTnI) values and significant  changes across serial measurements may suggest ACS but many other  chronic and acute conditions are known to elevate hsTnI results.  Refer to the "Links" section for chest pain  algorithms and additional  guidance. Performed at [facility], [address] [place], [place] [address]     PHQ2/9:    10/02/2022    8:02 AM 06/24/2022    8:52 AM 04/03/2022    9:42 AM 11/30/2021    8:19 AM 09/13/2021    9:43 AM  Depression screen PHQ 2/9  Decreased Interest 1 0 1 0 1  Down, Depressed, Hopeless 0 0 1 0 1  PHQ - 2 Score 1 0 2 0 2  Altered sleeping 1 0 1 0 1  Tired, decreased energy 1 1 1  0 1  Change in appetite 0 0 1 0 3  Feeling bad or failure about yourself  0 0 0 0 0  Trouble concentrating 0 1 0 0 0  Moving slowly or fidgety/restless 0 0 0 0 0  Suicidal thoughts 0 0 0 0 1  PHQ-9 Score 3 2 5  0 8  Difficult doing work/chores   Somewhat difficult  Somewhat difficult    phq 9 is positive   Fall Risk:    10/02/2022    8:01 AM 06/24/2022    8:51 AM 04/03/2022    9:42 AM 11/30/2021    8:19 AM 09/13/2021    9:42  AM  Fall Risk   Falls in the past year? 0 0 1 1 1   Number falls in past yr: 0 0 1 1 1   Injury with Fall? 0 0 0 1 1  Risk for fall due to : No Fall Risks No Fall Risks History of fall(s) History of fall(s);Mental status change Impaired balance/gait;History of fall(s)  Follow up Falls prevention discussed Falls prevention discussed Falls prevention discussed;Education provided;Falls evaluation completed Education provided;Falls evaluation completed;Falls prevention discussed Education provided;Falls prevention discussed      Functional Status Survey: Is the patient deaf or have difficulty hearing?: No Does the patient have difficulty seeing, even when wearing glasses/contacts?: No Does the patient have difficulty concentrating, remembering, or making decisions?: No Does the patient have difficulty walking or climbing stairs?: No Does the patient have difficulty dressing or bathing?: Yes Does the patient have difficulty doing errands alone such as visiting a doctor's office or shopping?: No    Assessment & Plan  1. Recurrent strokes (HCC)  Under the care of neurologist   2. Pre-diabetes  - Hemoglobin A1c  3. Mild episode of recurrent major depressive disorder (HCC)  On medication   4. B12 deficiency  - B12 and Folate Panel  5. Chronic insomnia  - zolpidem (AMBIEN CR) 12.5 MG CR tablet; Take 1 tablet (12.5 mg total) by mouth at bedtime as needed for sleep.  Dispense: 90 tablet; Refill: 0  6. Dysautonomia (HCC)  Seeing Dr. Graciela Husbands   7. Migraine without aura and without status migrainosus, not intractable  stable  8. OSA (obstructive sleep apnea)  Compliant with CPAP  9. GAD (generalized anxiety disorder)  - ALPRAZolam (XANAX XR) 1 MG 24 hr tablet; Take 1 tablet (1 mg total) by mouth every morning.  Dispense: 90 tablet; Refill: 0  10. Dyslipidemia  - Lipid panel  11. Vitamin D deficiency  - VITAMIN D 25 Hydroxy (Vit-D Deficiency, Fractures)  12. Long-term  use of high-risk medication  - COMPLETE METABOLIC PANEL WITH GFR

## 2022-10-02 NOTE — Telephone Encounter (Signed)
Pt refused to schedule with Dr Azucena Cecil Scheduled with Dr Graciela Husbands on 10/31 Would like to know if we will send in her refill Please advise

## 2022-10-03 LAB — LIPID PANEL
Cholesterol: 271 mg/dL — ABNORMAL HIGH (ref <200)
HDL: 39 mg/dL — ABNORMAL LOW (ref >=50)
Non-HDL Cholesterol (Calc): 232 mg/dL — ABNORMAL HIGH (ref <130)
Total CHOL/HDL Ratio: 6.9 (calc) — ABNORMAL HIGH (ref <5.0)
Triglycerides: 474 mg/dL — ABNORMAL HIGH (ref <150)

## 2022-10-03 LAB — COMPLETE METABOLIC PANEL WITH GFR
AG Ratio: 2.4 (calc) (ref 1.0–2.5)
ALT: 31 U/L — ABNORMAL HIGH (ref 6–29)
AST: 20 U/L (ref 10–35)
Albumin: 4.8 g/dL (ref 3.6–5.1)
Alkaline phosphatase (APISO): 66 U/L (ref 37–153)
BUN/Creatinine Ratio: 19 (calc) (ref 6–22)
BUN: 20 mg/dL (ref 7–25)
CO2: 29 mmol/L (ref 20–32)
Calcium: 9.9 mg/dL (ref 8.6–10.4)
Chloride: 102 mmol/L (ref 98–110)
Creat: 1.04 mg/dL — ABNORMAL HIGH (ref 0.50–1.03)
Globulin: 2 g/dL (ref 1.9–3.7)
Glucose, Bld: 113 mg/dL — ABNORMAL HIGH (ref 65–99)
Potassium: 4.5 mmol/L (ref 3.5–5.3)
Sodium: 141 mmol/L (ref 135–146)
Total Bilirubin: 0.5 mg/dL (ref 0.2–1.2)
Total Protein: 6.8 g/dL (ref 6.1–8.1)
eGFR: 65 mL/min/{1.73_m2} (ref >=60)

## 2022-10-03 LAB — CERVICOVAGINAL ANCILLARY ONLY
Bacterial Vaginitis (gardnerella): NEGATIVE
Candida Glabrata: NEGATIVE
Candida Vaginitis: NEGATIVE
Chlamydia: NEGATIVE
Comment: NEGATIVE
Comment: NEGATIVE
Comment: NEGATIVE
Comment: NEGATIVE
Comment: NEGATIVE
Comment: NORMAL
Neisseria Gonorrhea: NEGATIVE
Trichomonas: NEGATIVE

## 2022-10-03 LAB — VITAMIN D 25 HYDROXY (VIT D DEFICIENCY, FRACTURES): Vit D, 25-Hydroxy: 73 ng/mL (ref 30–100)

## 2022-10-03 LAB — HEMOGLOBIN A1C
Hgb A1c MFr Bld: 6.1 %{Hb} — ABNORMAL HIGH (ref <5.7)
Mean Plasma Glucose: 128 mg/dL
eAG (mmol/L): 7.1 mmol/L

## 2022-10-03 LAB — B12 AND FOLATE PANEL
Folate: 9.5 ng/mL
Vitamin B-12: 371 pg/mL (ref 200–1100)

## 2022-10-09 DIAGNOSIS — R9431 Abnormal electrocardiogram [ECG] [EKG]: Secondary | ICD-10-CM | POA: Diagnosis not present

## 2022-10-09 DIAGNOSIS — I493 Ventricular premature depolarization: Secondary | ICD-10-CM | POA: Diagnosis not present

## 2022-10-31 ENCOUNTER — Other Ambulatory Visit: Payer: Self-pay | Admitting: Family Medicine

## 2022-10-31 DIAGNOSIS — F330 Major depressive disorder, recurrent, mild: Secondary | ICD-10-CM

## 2022-10-31 DIAGNOSIS — F411 Generalized anxiety disorder: Secondary | ICD-10-CM

## 2022-11-01 ENCOUNTER — Other Ambulatory Visit: Payer: Self-pay | Admitting: Family Medicine

## 2022-11-01 ENCOUNTER — Telehealth: Payer: Self-pay | Admitting: Family Medicine

## 2022-11-01 DIAGNOSIS — F330 Major depressive disorder, recurrent, mild: Secondary | ICD-10-CM

## 2022-11-01 DIAGNOSIS — F411 Generalized anxiety disorder: Secondary | ICD-10-CM

## 2022-11-01 MED ORDER — PAROXETINE HCL ER 25 MG PO TB24
ORAL_TABLET | ORAL | 1 refills | Status: DC
Start: 2022-11-01 — End: 2023-03-26

## 2022-11-01 NOTE — Telephone Encounter (Signed)
Medication Refill - Medication: PARoxetine (PAXIL-CR) 25 MG 24 hr tablet   Has the patient contacted their pharmacy? Yes.   No, more refills.  (Agent: If yes, when and what did the pharmacy advise?)  Preferred Pharmacy (with phone number or street name):  [id number] Nicholes Rough, [place] - [address] AT [facility] [address]  [address] [place] [address]  Phone: [phone] Fax: [phone]  Hours: Not open 24 hours   Has the patient been seen for an appointment in the last year OR does the patient have an upcoming appointment? Yes.    Agent: Please be advised that RX refills may take up to 3 business days. We ask that you follow-up with your pharmacy.

## 2022-11-01 NOTE — Telephone Encounter (Signed)
Pt is calling to follow up on medication refill for PARoxetine (PAXIL-CR) 25 MG 24 hr tablet [[id number]] . Pharmacy sent request for medication refill yesterday at 4:19p. Patient called today at 8:45 am to request the same medication.  Patient was advised that medication refills can take 3 business days. Patient states that she should not have to call 3 days before she runs out of medicaiton for something that the doctor should have done at the last visit.   Agent advised pt that another message would be sent to provider. Pt asked agent was she in the office. That she would drive over to the office to speak to someone. Asked to be transferred to office staff. Pt was transferred to [facility].

## 2022-11-01 NOTE — Telephone Encounter (Signed)
Requested Prescriptions  Pending Prescriptions Disp Refills   PARoxetine (PAXIL-CR) 25 MG 24 hr tablet 180 tablet 1    Sig: TAKE 2 TABLET BY MOUTH DAILY     Psychiatry:  Antidepressants - SSRI Passed - 11/01/2022  3:06 PM      Passed - Completed PHQ-2 or PHQ-9 in the last 360 days      Passed - Valid encounter within last 6 months    Recent Outpatient Visits           1 month ago Recurrent strokes [facility])   [facility] Cory, MD   4 months ago Recurrent strokes [facility])   [facility] Al[place] Cory, MD   7 months ago Dysautonomia [facility])   [facility] [place] Cory, MD   11 months ago Migraine without aura and without status migrainosus, not intractable   [facility] Cory, MD   1 year ago Recurrent strokes [facility])   [facility] Alba Cory, MD       Future Appointments             In 1 month Duke Salvia, MD [facility] [place]   In 1 month Alba Cory, MD [facility], [facility]

## 2022-11-01 NOTE — Telephone Encounter (Signed)
Medication refill re-sent to Dr. Carlynn Purl high priority

## 2022-12-19 ENCOUNTER — Ambulatory Visit: Payer: BC Managed Care – PPO | Attending: Internal Medicine | Admitting: Internal Medicine

## 2022-12-20 ENCOUNTER — Encounter: Payer: Self-pay | Admitting: Internal Medicine

## 2022-12-23 ENCOUNTER — Other Ambulatory Visit: Payer: Self-pay | Admitting: Internal Medicine

## 2022-12-23 NOTE — Progress Notes (Unsigned)
Name: Alicia [name] Note Polcyn   MRN: [id number]    DOB: [birth year]   Date:12/24/2022       Progress Note  Subjective  Chief Complaint  Follow Up  HPI  CVA with hemiparesis : 12/18/2016, admitted to [facility] , mild distal left PICA. She is feeling better, but still has lack of balance but not using a cane, episodes of dysarthria, continues to have tremors, and has some left side weakness .  She still has some short term memory difficulties and difficulty finding the correct words  to express herself at times.  She had at least 4 seizure like activities first one in 2018 ,  09/2017, had one in April after surgery 05/2019, she states had a similar episode Fall 2022 .She states when at work and super tired she falls asleep on her desk. She feels very sleepy and lethargic and has to put her head down, it can last 8-9 minutes and when she wakes up she is able to function. At times she has episodes of nausea, gets confused and has to rest She  is taking Crestor. She recently went to [facility] - Dr. Gevena Cotton  02/2021 , had repeat MRI and advised to stop aspirin and start Plavix  since studies showed not responsive to aspirin, there is still a concern for mild FMD in her cervical ICA and vertebral arteries. Possible PFO but cardiac MRI negative for cardio embolic source. She had another episode of confusion and left side weakness on 07/23  but CTA, MRI and CT all negative except for old lacunar infarct - she states still feeling weak on left side and some dysarthria , she has mental fogginess. She has FMLA and able to work from home about once a week. We will try ozempic to decrease recurrence of strokes due to cardiovascular benefits   Summary of transcranial doppler 05/30/2020 No HITS at rest or during Valsalva. Negative transcranial Doppler Bubble study with no evidence of right to left intracardiac communication. A vascular evaluation was performed. The right middle cerebral artery was studied. An IV  was inserted into the patient's left antecubital. Verbal informed consent was obtained. Negative TCD Bubble study  Cardiac MRI heart: 12/11/2020   Normal EF, unremarkable  Migraine : She is under the care of Dr. Sherryll Burger. Currently on Lamictal and also prn Ubrelvy.  She misses work about 4 hours q 2 weeks due to migraine headaches. She states described as a band around her head and associated with phonophobia and photophobia.  Episodes a little  more frequent lately, stress is high , about once a week.   Orthostatic hypotension with tachycardia/Dysautonomia :   and had  TEE for evaluation of TIA's and strokes to rule out foramen ovale - so far no structural heart disease found. She is still taking ProAmatine , she also has frequent PVC's. Symptoms worse when increase in stress. She has a need to fall asleep and unable to stay awake, she has to put her head until she wakes up on her own, work is allowing her at this time to close her door and take a nap when that happens , she also is able to work from home about once a week or leave early if needed. She has FMLA forms  Long haul COVID-19: diagnosed 02/04/2020, she continues to have fatigue and SOB with activity . She already had some brain fogginess before COVID-19 but feels worse since COVID , she also has intermittent tightness in the middle of  her back  COVID-19, she had a second episode of COVID-05 Feb 2021. She feels like myalgia and fatigues , she sees a psyiatrist and is taking gabapentin that helps with radiculitis - down both arms -  symptoms but not with joint and muscle pain    GAD/MDD : Husband died suddenly of a brain aneurysm back in  October 23 rd, 2016.  She has been taking her antidepressant medication and Alprazolam as prescribed.  She remarried her high school sweet heart 06/07/2016 ( and he moved here from [place])  Had a stroke 12/2016. We adjusted Paxil  dose from 40 mg to 60 mg Fall of 2020 and symptoms were controlled, we switched  sustained release 37.5  but she noticed it was not working as well so she added 20 mg immediate release. Insurance denied paying for two separate formulations and is currently taking two 25 CR per day. She still avoid crowds, work still allows her to not participate on larger meetings and take breaks when needed.   Hypertriglyceridemia: currently off Vascepa due to cost and nausea, she also stopped taking Crestor but states it was causing her headaches and muscle pain. LDL during hospital stay was up to 86, HDL 42 , triglycerides was up. She is taking a otc supplement. We will try adding Ozempic due to high risk of strokes/cardiovascular benefits    Pre-diabetes : last A1C was 6.1 % back in August 2024. She denies polyphagia, polydipsia or polyuria. She continues to gain weight but is cutting down on carbohydrates   Insomnia: taking Ambien and is taking her less than 30 minutes to fall asleep .She is aware of long term risk of Ambien, but states cannot sleep without it. She also knows that the dose she is taking is not FDA approved for females. She states medication is working now, when tired enough able to fall and stay asleep. We tried going down on flexeril because of compounding sedative effect, but neck pain and headaches got worse, she was unable to go down on Flexeril but lower dose did not work for neck spasms. She states she has a good bedtime routine, stops screen 30 minutes before time and chats with her husband before bed. Symptoms are stable     Neck and back pain: currently seeing psychiatrist , taking Flexeril , still cannot afford PT. She had a positive HLA B27. She is now also taking gabapentin 100 mg in am, at lunch and 300 mg at night. Continue current regiment   Thyroid nodule:  also has low T3 but normal TSH - done recently  IMPRESSION: 12/2019  1. Mildly heterogeneous but otherwise unremarkable thyroid gland without evidence of nodule. 2. The palpable abnormality corresponds with  a small 0.6 cm lymph node. Favor reactive lymphadenopathy. Recommend continued clinical surveillance. If there is evidence of growth over time, repeat imaging and potentially biopsy may become warranted.  GERD: she had an esophogram, barium swallow done on 05/19/2019 that showed mild GERD, she was able to stop PPI and is doing well, still only taking Tums prn   OSA: wearing CPAP every night and she states it helps with her fatigue    Reactive Airway Dysfunction syndrome: she saw Jana Half, pulmonologist, advised to stop Symbicort , take prn albuterol and is now on Breo . She had COVID Dec 2021 and again Dec 2022 - she continues to have intermittent SOB, usually triggered by activity .Stable   Leukocytoclastic vasculitis: based on skin biopsy done by Dr. Gwen Pounds, multiple  labs done and negative, she saw  Dr. Renard Matter in the past and is under the care of Dr. Allena Katz  first visit in early 2024. She states lesions usually on face of scalp lately   B12 deficiency: getting monthly injections, last level was 371    Patient Active Problem List   Diagnosis Date Noted   Cryptogenic stroke (HCC) 09/09/2022   PVC's (premature ventricular contractions) 09/09/2022   Dysautonomia (HCC) 04/03/2022   Hemiparesis of left nondominant side as late effect of cerebral infarction (HCC) 04/03/2022   Mild episode of recurrent major depressive disorder (HCC) 04/03/2022   HLA B27 (HLA B27 positive) 04/03/2022   Essential hypertension 08/30/2021   COVID-19 long hauler manifesting chronic fatigue 03/21/2020   Orthostatic hypotension 02/23/2020   Atypical chest pain 02/23/2020   Reactive airways dysfunction syndrome (HCC) 10/15/2019   Hyperfunction of pituitary gland, unspecified (HCC) 10/15/2019   Calculus of gallbladder without cholecystitis without obstruction 05/06/2019   Cyst of pancreas 05/06/2019   Right kidney stone 05/06/2019   Leukocytoclastic vasculitis (HCC) 10/07/2018   Chronic insomnia 02/09/2018    History of anemia 02/09/2018   Sleep apnea 09/24/2017   History of ischemic stroke 05/30/2017   Hyperlipidemia 05/30/2017   Migraine without aura and without status migrainosus, not intractable 05/27/2017   Pain in right knee 01/21/2017   Psychogenic nonepileptic seizure    Chronic pain syndrome    Restless leg syndrome    Chronic prescription benzodiazepine use 01/20/2016   Leukocytosis 01/20/2016   Seizure-like activity (HCC)    GAD (generalized anxiety disorder) 02/14/2015   Chronic neck pain 02/14/2015   History of hysterectomy 02/14/2015   Iron deficiency anemia due to chronic blood loss 08/29/2014   Insomnia, persistent 08/14/2014   Major depression (HCC) 08/14/2014   Temporomandibular joint sounds on opening and/or closing the jaw 08/14/2014   Degenerative disc disease, lumbar 08/14/2014   Bleeding internal hemorrhoids 08/14/2014   Gastric reflux 08/14/2014   Irritable bowel syndrome with constipation 08/14/2014   Hypertriglyceridemia 08/14/2014   Overweight 08/14/2014   Tinnitus 08/14/2014   Vitamin D deficiency 08/14/2014   Sinus tachycardia 11/25/2012   DOE (dyspnea on exertion) 11/06/2012    Past Surgical History:  Procedure Laterality Date   ABDOMINOPLASTY  Feb. 2015   CESAREAN SECTION  2011   CHOLECYSTECTOMY N/A 05/31/2019   Procedure: LAPAROSCOPIC CHOLECYSTECTOMY WITH INTRAOPERATIVE CHOLANGIOGRAM;  Surgeon: Earline Mayotte, MD;  Location: [facility] ORS;  Service: General;  Laterality: N/A;   COLONOSCOPY WITH PROPOFOL N/A 11/26/2016   Procedure: COLONOSCOPY WITH PROPOFOL;  Surgeon: Wyline Mood, MD;  Location: [facility] ENDOSCOPY;  Service: Gastroenterology;  Laterality: N/A;   COLONOSCOPY WITH PROPOFOL N/A 08/08/2020   Procedure: COLONOSCOPY WITH PROPOFOL;  Surgeon: Regis Bill, MD;  Location: [facility] ENDOSCOPY;  Service: Endoscopy;  Laterality: N/A;   CYSTOSCOPY  02/02/2015   Procedure: CYSTOSCOPY;  Surgeon: Nadara Mustard, MD;  Location: [facility] ORS;  Service:  Gynecology;;   DILATION AND CURETTAGE OF UTERUS  2003, 2005, 2008   ESOPHAGOGASTRODUODENOSCOPY N/A 05/31/2019   Procedure: ESOPHAGOGASTRODUODENOSCOPY (EGD);  Surgeon: Earline Mayotte, MD;  Location: [facility] ORS;  Service: General;  Laterality: N/A;  in the O.R.   ESOPHAGOGASTRODUODENOSCOPY (EGD) WITH PROPOFOL N/A 11/26/2016   Procedure: ESOPHAGOGASTRODUODENOSCOPY (EGD) WITH PROPOFOL;  Surgeon: Wyline Mood, MD;  Location: [facility] ENDOSCOPY;  Service: Gastroenterology;  Laterality: N/A;   HERNIA REPAIR  1999   Umbilical   KNEE ARTHROSCOPY Right 2006   LAPAROSCOPIC BILATERAL SALPINGECTOMY Bilateral 02/02/2015   Procedure: LAPAROSCOPIC BILATERAL  SALPINGECTOMY;  Surgeon: Nadara Mustard, MD;  Location: [facility] ORS;  Service: Gynecology;  Laterality: Bilateral;   LAPAROSCOPIC HYSTERECTOMY N/A 02/02/2015   Procedure: HYSTERECTOMY TOTAL LAPAROSCOPIC;  Surgeon: Nadara Mustard, MD;  Location: [facility] ORS;  Service: Gynecology;  Laterality: N/A;   LOOP RECORDER INSERTION N/A 12/20/2016   Procedure: LOOP RECORDER INSERTION;  Surgeon: Regan Lemming, MD;  Location: [facility];  Service: Cardiovascular;  Laterality: N/A;   REDUCTION MAMMAPLASTY Bilateral December 2015   TEE WITHOUT CARDIOVERSION N/A 12/20/2016   Procedure: TRANSESOPHAGEAL ECHOCARDIOGRAM (TEE);  Surgeon: Lars Masson, MD;  Location: [facility] ENDOSCOPY;  Service: Cardiovascular;  Laterality: N/A;   TEE WITHOUT CARDIOVERSION N/A 03/29/2020   Procedure: TRANSESOPHAGEAL ECHOCARDIOGRAM (TEE);  Surgeon: Debbe Odea, MD;  Location: [facility] ORS;  Service: Cardiovascular;  Laterality: N/A;    Family History  Problem Relation Age of Onset   Hypertension Mother    Hyperlipidemia Mother    Heart Problems Father        hole in heart and lower ventricles reversed   Prostate cancer Maternal Grandfather    Von Willebrand disease Maternal Uncle     Social History   Tobacco Use   Smoking status: Never   Smokeless tobacco: Never  Substance  Use Topics   Alcohol use: No    Alcohol/week: 0.0 standard drinks of alcohol     Current Outpatient Medications:    clopidogrel (PLAVIX) 75 MG tablet, Take 75 mg by mouth daily., Disp: , Rfl:    fluticasone furoate-vilanterol (BREO ELLIPTA) 100-25 MCG/ACT AEPB, Inhale 1 puff into the lungs daily., Disp: 180 each, Rfl: 1   gabapentin (NEURONTIN) 100 MG capsule, Take 100 mg by mouth 3 (three) times daily., Disp: , Rfl:    hydrocortisone 2.5 % cream, Apply topically., Disp: , Rfl:    lamoTRIgine (LAMICTAL) 100 MG tablet, Take 100 mg by mouth 2 (two) times daily., Disp: , Rfl:    midodrine (PROAMATINE) 5 MG tablet, Take 1 tablet (5 mg) by mouth three times a day at 7 am, 11 am, & 3 pm, Disp: 270 tablet, Rfl: 0   PARoxetine (PAXIL-CR) 25 MG 24 hr tablet, TAKE 2 TABLET BY MOUTH DAILY, Disp: 180 tablet, Rfl: 1   Semaglutide,0.25 or 0.5MG /DOS, (OZEMPIC, 0.25 OR 0.5 MG/DOSE,) 2 MG/3ML SOPN, Inject 0.25-0.5 mg into the skin once a week., Disp: 3 mL, Rfl: 0   sodium chloride 1 g tablet, Take 1 tablet (1 g total) by mouth 3 (three) times daily with meals., Disp: 270 tablet, Rfl: 3   UBRELVY 100 MG TABS, Take 100 mg by mouth daily as needed (migraine). , Disp: , Rfl:    Vitamin D, Ergocalciferol, (DRISDOL) 1.25 MG (50000 UNIT) CAPS capsule, Take 1 capsule (50,000 Units total) by mouth every 7 (seven) days., Disp: 12 capsule, Rfl: 1   ALPRAZolam (XANAX XR) 1 MG 24 hr tablet, Take 1 tablet (1 mg total) by mouth every morning., Disp: 90 tablet, Rfl: 0   cyclobenzaprine (FLEXERIL) 10 MG tablet, Take 1 tablet (10 mg total) by mouth at bedtime., Disp: 90 tablet, Rfl: 1   zolpidem (AMBIEN CR) 12.5 MG CR tablet, Take 1 tablet (12.5 mg total) by mouth at bedtime as needed for sleep., Disp: 90 tablet, Rfl: 0  Allergies  Allergen Reactions   Azithromycin Diarrhea   Penicillins Hives and Swelling    Has patient had a PCN reaction causing immediate rash, facial/tongue/throat swelling, SOB or lightheadedness with  hypotension: Yes Has patient had a PCN  reaction causing severe rash involving mucus membranes or skin necrosis: No Has patient had a PCN reaction that required hospitalization No Has patient had a PCN reaction occurring within the last 10 years: No If all of the above answers are "NO", then may proceed with Cephalosporin use.    I personally reviewed active problem list, medication list, allergies, family history, social history, health maintenance with the patient/caregiver today.   ROS  Ten systems reviewed and is negative except as mentioned in HPI    Objective  Vitals:   12/24/22 0745  BP: 118/70  Pulse: 88  Resp: 16  SpO2: 92%  Weight: 213 lb (96.6 kg)  Height: 5\' 8"  (1.727 m)    Body mass index is 32.39 kg/m.  Physical Exam  Constitutional: Patient appears well-developed and well-nourished. Obese No distress.  HEENT: head atraumatic, normocephalic, pupils equal and reactive to light, neck supple Cardiovascular: Normal rate, extra beats heard, normal heart sounds.  No murmur heard. No BLE edema. Pulmonary/Chest: Effort normal and breath sounds normal. No respiratory distress. Abdominal: Soft.  There is no tenderness. Psychiatric: Patient has a normal mood and affect. behavior is normal. Judgment and thought content normal.    PHQ2/9:    12/24/2022    7:45 AM 10/02/2022    8:02 AM 06/24/2022    8:52 AM 04/03/2022    9:42 AM 11/30/2021    8:19 AM  Depression screen PHQ 2/9  Decreased Interest 0 1 0 1 0  Down, Depressed, Hopeless 0 0 0 1 0  PHQ - 2 Score 0 1 0 2 0  Altered sleeping  1 0 1 0  Tired, decreased energy  1 1 1  0  Change in appetite  0 0 1 0  Feeling bad or failure about yourself   0 0 0 0  Trouble concentrating  0 1 0 0  Moving slowly or fidgety/restless  0 0 0 0  Suicidal thoughts  0 0 0 0  PHQ-9 Score  3 2 5  0  Difficult doing work/chores    Somewhat difficult     phq 9 is negative   Fall Risk:    12/24/2022    7:45 AM 10/02/2022    8:01 AM  06/24/2022    8:51 AM 04/03/2022    9:42 AM 11/30/2021    8:19 AM  Fall Risk   Falls in the past year? 0 0 0 1 1  Number falls in past yr: 0 0 0 1 1  Injury with Fall? 0 0 0 0 1  Risk for fall due to : No Fall Risks No Fall Risks No Fall Risks History of fall(s) History of fall(s);Mental status change  Follow up Falls prevention discussed Falls prevention discussed Falls prevention discussed Falls prevention discussed;Education provided;Falls evaluation completed Education provided;Falls evaluation completed;Falls prevention discussed      Functional Status Survey: Is the patient deaf or have difficulty hearing?: No Does the patient have difficulty seeing, even when wearing glasses/contacts?: No Does the patient have difficulty concentrating, remembering, or making decisions?: No Does the patient have difficulty walking or climbing stairs?: No Does the patient have difficulty dressing or bathing?: No Does the patient have difficulty doing errands alone such as visiting a doctor's office or shopping?: No    Assessment & Plan  1. Mild episode of recurrent major depressive disorder (HCC)  Doing well on current regiment   2. Dysautonomia (HCC)  Unchanged   3. Seizure-like activity (HCC)  No recent episodes. Takes lamictal for  migraine prevention  4. Recurrent strokes (HCC)  - Semaglutide,0.25 or 0.5MG /DOS, (OZEMPIC, 0.25 OR 0.5 MG/DOSE,) 2 MG/3ML SOPN; Inject 0.25-0.5 mg into the skin once a week.  Dispense: 3 mL; Refill: 0  5. Leukocytoclastic vasculitis (HCC)  Keep follow up with Dermatologist   6. Hemiparesis of left nondominant side as late effect of cerebral infarction (HCC)  - Semaglutide,0.25 or 0.5MG /DOS, (OZEMPIC, 0.25 OR 0.5 MG/DOSE,) 2 MG/3ML SOPN; Inject 0.25-0.5 mg into the skin once a week.  Dispense: 3 mL; Refill: 0  7. Reactive airways dysfunction syndrome (HCC)  Continue Breo  8. Need for immunization against influenza  refused  9. B12  deficiency  - cyanocobalamin (VITAMIN B12) injection 1,000 mcg  10. Chronic insomnia  - zolpidem (AMBIEN CR) 12.5 MG CR tablet; Take 1 tablet (12.5 mg total) by mouth at bedtime as needed for sleep.  Dispense: 90 tablet; Refill: 0  11. GAD (generalized anxiety disorder)  - ALPRAZolam (XANAX XR) 1 MG 24 hr tablet; Take 1 tablet (1 mg total) by mouth every morning.  Dispense: 90 tablet; Refill: 0  12. Chronic neck pain  - cyclobenzaprine (FLEXERIL) 10 MG tablet; Take 1 tablet (10 mg total) by mouth at bedtime.  Dispense: 90 tablet; Refill: 1

## 2022-12-24 ENCOUNTER — Ambulatory Visit: Payer: BC Managed Care – PPO | Admitting: Family Medicine

## 2022-12-24 ENCOUNTER — Encounter: Payer: Self-pay | Admitting: Family Medicine

## 2022-12-24 ENCOUNTER — Other Ambulatory Visit: Payer: Self-pay | Admitting: Family Medicine

## 2022-12-24 VITALS — BP 118/70 | HR 88 | Resp 16 | Ht 68.0 in | Wt 213.0 lb

## 2022-12-24 DIAGNOSIS — M310 Hypersensitivity angiitis: Secondary | ICD-10-CM

## 2022-12-24 DIAGNOSIS — G8929 Other chronic pain: Secondary | ICD-10-CM

## 2022-12-24 DIAGNOSIS — E538 Deficiency of other specified B group vitamins: Secondary | ICD-10-CM

## 2022-12-24 DIAGNOSIS — Z23 Encounter for immunization: Secondary | ICD-10-CM | POA: Diagnosis not present

## 2022-12-24 DIAGNOSIS — F330 Major depressive disorder, recurrent, mild: Secondary | ICD-10-CM | POA: Diagnosis not present

## 2022-12-24 DIAGNOSIS — F5104 Psychophysiologic insomnia: Secondary | ICD-10-CM

## 2022-12-24 DIAGNOSIS — R569 Unspecified convulsions: Secondary | ICD-10-CM | POA: Diagnosis not present

## 2022-12-24 DIAGNOSIS — G901 Familial dysautonomia [Riley-Day]: Secondary | ICD-10-CM

## 2022-12-24 DIAGNOSIS — M542 Cervicalgia: Secondary | ICD-10-CM

## 2022-12-24 DIAGNOSIS — I639 Cerebral infarction, unspecified: Secondary | ICD-10-CM

## 2022-12-24 DIAGNOSIS — F411 Generalized anxiety disorder: Secondary | ICD-10-CM

## 2022-12-24 DIAGNOSIS — J683 Other acute and subacute respiratory conditions due to chemicals, gases, fumes and vapors: Secondary | ICD-10-CM

## 2022-12-24 DIAGNOSIS — I69354 Hemiplegia and hemiparesis following cerebral infarction affecting left non-dominant side: Secondary | ICD-10-CM

## 2022-12-24 MED ORDER — CYANOCOBALAMIN 1000 MCG/ML IJ SOLN
1000.0000 ug | Freq: Once | INTRAMUSCULAR | Status: AC
Start: 2022-12-24 — End: 2022-12-24
  Administered 2022-12-24: 1000 ug via INTRAMUSCULAR

## 2022-12-24 MED ORDER — ALPRAZOLAM ER 1 MG PO TB24
1.0000 mg | ORAL_TABLET | ORAL | 0 refills | Status: DC
Start: 2022-12-24 — End: 2023-03-26

## 2022-12-24 MED ORDER — OZEMPIC (0.25 OR 0.5 MG/DOSE) 2 MG/3ML ~~LOC~~ SOPN: 0.2500 mg | PEN_INJECTOR | SUBCUTANEOUS | 0 refills | Status: DC

## 2022-12-24 MED ORDER — ZOLPIDEM TARTRATE ER 12.5 MG PO TBCR
12.5000 mg | EXTENDED_RELEASE_TABLET | Freq: Every evening | ORAL | 0 refills | Status: DC | PRN
Start: 2022-12-24 — End: 2023-03-26

## 2022-12-24 MED ORDER — CYCLOBENZAPRINE HCL 10 MG PO TABS
10.0000 mg | ORAL_TABLET | Freq: Every day | ORAL | 1 refills | Status: DC
Start: 2022-12-24 — End: 2023-06-18

## 2022-12-24 MED ORDER — ZOLPIDEM TARTRATE ER 12.5 MG PO TBCR
12.5000 mg | EXTENDED_RELEASE_TABLET | Freq: Every evening | ORAL | 0 refills | Status: DC | PRN
Start: 2022-12-24 — End: 2022-12-24

## 2022-12-24 NOTE — Telephone Encounter (Signed)
Please contact pt for future appointment. ?Pt overdue for 2-3 wk f/u. ?Pt needing refills. ?

## 2023-01-14 ENCOUNTER — Other Ambulatory Visit: Payer: Self-pay

## 2023-01-14 ENCOUNTER — Emergency Department
Admission: EM | Admit: 2023-01-14 | Discharge: 2023-01-14 | Disposition: A | Discharge status: Home / Self Care | Payer: BC Managed Care – PPO | Attending: Emergency Medicine | Admitting: Emergency Medicine

## 2023-01-14 ENCOUNTER — Emergency Department: Payer: BC Managed Care – PPO

## 2023-01-14 DIAGNOSIS — Z8673 Personal history of transient ischemic attack (TIA), and cerebral infarction without residual deficits: Secondary | ICD-10-CM | POA: Diagnosis not present

## 2023-01-14 DIAGNOSIS — R1110 Vomiting, unspecified: Secondary | ICD-10-CM | POA: Diagnosis not present

## 2023-01-14 DIAGNOSIS — Z7902 Long term (current) use of antithrombotics/antiplatelets: Secondary | ICD-10-CM | POA: Diagnosis not present

## 2023-01-14 DIAGNOSIS — X58XXXA Exposure to other specified factors, initial encounter: Secondary | ICD-10-CM | POA: Diagnosis not present

## 2023-01-14 DIAGNOSIS — K219 Gastro-esophageal reflux disease without esophagitis: Secondary | ICD-10-CM | POA: Diagnosis not present

## 2023-01-14 DIAGNOSIS — S00412A Abrasion of left ear, initial encounter: Secondary | ICD-10-CM | POA: Insufficient documentation

## 2023-01-14 DIAGNOSIS — R059 Cough, unspecified: Secondary | ICD-10-CM | POA: Diagnosis not present

## 2023-01-14 DIAGNOSIS — Z1152 Encounter for screening for COVID-19: Secondary | ICD-10-CM | POA: Insufficient documentation

## 2023-01-14 LAB — RESP PANEL BY RT-PCR (RSV, FLU A&B, COVID)  RVPGX2
Influenza A by PCR: NEGATIVE
Influenza B by PCR: NEGATIVE
Resp Syncytial Virus by PCR: NEGATIVE
SARS Coronavirus 2 by RT PCR: NEGATIVE

## 2023-01-14 MED ORDER — FAMOTIDINE 20 MG PO TABS: 20.0000 mg | ORAL_TABLET | Freq: Two times a day (BID) | ORAL | 1 refills | Status: DC

## 2023-01-14 NOTE — ED Provider Notes (Signed)
[facility] Provider Note    Event Date/Time   First MD Initiated Contact with Patient 01/14/23 [phone]     (approximate)   History   Blood in Ear and Cough   HPI Alicia [name] is a 52 y.o. female who presents for evaluation of some blood coming from her left ear but primarily because of multiple episodes of sudden onset vomiting while she is asleep.  She initially described it as a cough because she said "that is just what I call it", but after extensive discussion, we clarified that she has had at least 3 and likely more episodes where she is asleep and then jumps up out of bed suddenly and has to run to the bathroom to vomit and/or cough extensively.  It is often a burning sensation.  She does not have any persistent nausea or vomiting afterwards and it never happens during the day.  She said that she will have a frequent mild cough (an actual cough) in between the episodes, but the episodes happen every few weeks.  She denies fever, chills, chest pain, shortness of breath (except for when she is coughing/vomiting).  She has had multiple CVAs in the past with some language finding difficulties as result.  She takes Plavix given her history of CVA.  Of note, she reports that she has had extensive GI problems and evaluations in the past including an endoscopy.  She does not remember the last time she saw a GI provider.  She said that she has taken relatively short courses of antiacid medication in the past, but she is not taking it regularly and is not taking it now.     Physical Exam   Triage Vital Signs: ED Triage Vitals  Encounter Vitals Group     BP 01/14/23 0239 112/67     Systolic BP Percentile --      Diastolic BP Percentile --      Pulse Rate 01/14/23 0239 89     Resp 01/14/23 0239 20     Temp 01/14/23 0239 97.9 F (36.6 C)     Temp src --      SpO2 01/14/23 0239 98 %     Weight 01/14/23 0239 93 kg (205 lb)     Height 01/14/23 0239 1.753 m  (5\' 9" )     Head Circumference --      Peak Flow --      Pain Score 01/14/23 0239 0     Pain Loc --      Pain Education --      Exclude from Growth Chart --     Most recent vital signs: Vitals:   01/14/23 0239 01/14/23 0611  BP: 112/67 (!) 140/66  Pulse: 89 80  Resp: 20 18  Temp: 97.9 F (36.6 C) 98.1 F (36.7 C)  SpO2: 98% 97%    General: Awake, no distress.  Generally well-appearing. CV:  Good peripheral perfusion.  Normal heart sounds. Resp:  Normal effort. Speaking easily and comfortably, no accessory muscle usage nor intercostal retractions.  Lungs are clear to auscultation. Abd:  No distention.  No tenderness to palpation. Other:  Examined the patient's ears and the right ear canal is completely clear with a normal-appearing tympanic membrane.  The left ear canal has a small area of abrasion on the inferior part of the ear canal but no active bleeding.  Tympanic membrane is intact and normal in appearance.   ED Results / Procedures / Treatments  Labs (all labs ordered are listed, but only abnormal results are displayed) Labs Reviewed  RESP PANEL BY RT-PCR (RSV, FLU A&B, COVID)  [id number]     PROCEDURES:  Critical Care performed: No  Procedures    IMPRESSION / MDM / ASSESSMENT AND PLAN / ED COURSE  I reviewed the triage vital signs and the nursing notes.                              Differential diagnosis includes, but is not limited to, acid reflux complicated by hiatal hernia, aspiration, community-acquired pneumonia, viral illness.  Patient's presentation is most consistent with acute presentation with potential threat to life or bodily function.  Labs/studies ordered: Respiratory viral panel, two-view chest x-ray  Interventions/Medications given:  Medications - No data to display  (Note:  hospital course my include additional interventions and/or labs/studies not listed above.)  Vital signs are stable and within normal limits and the patient's  physical exam is reassuring.  She only has these episodes at night.  There was initially a great deal of confusion over what she meant by cough versus vomit, but the issue does not seem to primarily be respiratory.  I strongly suspect that, particularly given that she does not take a PPI, she is aspirating stomach contents while she is asleep, causing a combination of vomiting and coughing as she is describing.  I viewed and interpreted her two-view chest x-ray and I see no evidence of pneumonia.  I talked with her about my theory that this is due to acid reflux and she agrees as very possible.  Originally I was going to recommend that she take a PPI, but there may be a drug interaction between PPIs and Plavix.  Instead I recommended Pepcid (famotidine) and close follow-up with her GI provider.  She agrees with the plan.  There is no indication of an emergent medical condition that requires more evaluation or treatment at this time.  I gave my usual and customary management recommendations for GERD and my return precautions.      FINAL CLINICAL IMPRESSION(S) / ED DIAGNOSES   Final diagnoses:  Gastroesophageal reflux disease, unspecified whether esophagitis present     Rx / DC Orders   ED Discharge Orders          Ordered    famotidine (PEPCID) 20 MG tablet  2 times daily        01/14/23 0706             Note:  This document was prepared using Dragon voice recognition software and may include unintentional dictation errors.   Loleta Rose, MD 01/14/23 [phone]

## 2023-01-14 NOTE — ED Triage Notes (Addendum)
Pt to ED via POV c/o blood in ear and cough. Pt reports she had some bleeding in her left ear a few weeks ago. Pt says that it is now draining to back of throat. Pt also complaining of coughing up green phlegm yesterday.

## 2023-01-14 NOTE — Discharge Instructions (Signed)
As we discussed, we believe your symptoms are due to acid reflux and your known hiatal hernia.  Please take an antiacid medication such as famotidine (Pepcid); we discussed a medication like omeprazole, but this class of medication may have some drug interactions with your Plavix.  Try not eating for multiple hours before you go to bed and sleeping in a raised position on several pillows.  Follow-up with your GI provider at the next available opportunity.  Return to the emergency department if you develop new or worsening symptoms that concern you.

## 2023-01-30 DIAGNOSIS — H9202 Otalgia, left ear: Secondary | ICD-10-CM | POA: Diagnosis not present

## 2023-02-03 DIAGNOSIS — K760 Fatty (change of) liver, not elsewhere classified: Secondary | ICD-10-CM | POA: Diagnosis not present

## 2023-02-03 DIAGNOSIS — K862 Cyst of pancreas: Secondary | ICD-10-CM | POA: Diagnosis not present

## 2023-02-05 ENCOUNTER — Telehealth: Payer: Self-pay

## 2023-02-05 NOTE — Progress Notes (Signed)
Transition Care Management Follow-up Telephone Call Date of discharge and from where: 01/14/2023 [facility] How have you been since you were released from the hospital? Patient stated she is feeling much better. Any questions or concerns? No  Items Reviewed: Did the pt receive and understand the discharge instructions provided? Yes  Medications obtained and verified? Yes  Other? No  Any new allergies since your discharge? No  Dietary orders reviewed? Yes Do you have support at home? Yes   Follow up appointments reviewed:  PCP Hospital f/u appt confirmed? Yes  Scheduled to see Alba Cory, MD on 03/26/2023 @ [facility]. Specialist Hospital f/u appt confirmed? Yes  Scheduled to see Carson Myrtle, MD on 02/13/2023 @ [facility]. Are transportation arrangements needed? No  If their condition worsens, is the pt aware to call PCP or go to the Emergency Dept.? Yes Was the patient provided with contact information for the PCP's office or ED? Yes Was to pt encouraged to call back with questions or concerns? Yes   Meiah Zamudio [facility]  [facility] [place] [facility] Guide Direct Dial: [phone]  Website: [email or web address]

## 2023-02-13 DIAGNOSIS — R1011 Right upper quadrant pain: Secondary | ICD-10-CM | POA: Diagnosis not present

## 2023-02-13 DIAGNOSIS — Q453 Other congenital malformations of pancreas and pancreatic duct: Secondary | ICD-10-CM | POA: Diagnosis not present

## 2023-02-13 DIAGNOSIS — K862 Cyst of pancreas: Secondary | ICD-10-CM | POA: Diagnosis not present

## 2023-02-13 DIAGNOSIS — K760 Fatty (change of) liver, not elsewhere classified: Secondary | ICD-10-CM | POA: Diagnosis not present

## 2023-02-17 ENCOUNTER — Encounter: Payer: Self-pay | Admitting: Internal Medicine

## 2023-02-17 ENCOUNTER — Ambulatory Visit: Payer: BC Managed Care – PPO | Attending: Internal Medicine | Admitting: Internal Medicine

## 2023-02-17 VITALS — BP 115/63 | HR 98 | Ht 68.0 in | Wt 212.8 lb

## 2023-02-17 DIAGNOSIS — G901 Familial dysautonomia [Riley-Day]: Secondary | ICD-10-CM | POA: Diagnosis not present

## 2023-02-17 DIAGNOSIS — I493 Ventricular premature depolarization: Secondary | ICD-10-CM

## 2023-02-17 MED ORDER — CLOPIDOGREL BISULFATE 75 MG PO TABS: 75.0000 mg | ORAL_TABLET | Freq: Every day | ORAL | 0 refills | Status: DC

## 2023-02-17 NOTE — Progress Notes (Signed)
Patient Care Team: Alba Cory, MD as PCP - General (Family Medicine) Debbe Odea, MD as PCP - Cardiology (Cardiology) Wyline Mood, MD as Consulting Physician (Gastroenterology) Salena Saner, MD as Consulting Physician (Pulmonary Disease) Lonell Face, MD as Consulting Physician (Neurology) Dayna Barker, MD as Referring Physician (Internal Medicine)   HPI  Alicia [name] is a 52 y.o. female seen in follow-up for PVCs-high-frequency History of cryptogenic stroke 2018>> imaging demonstrated small cerebellar and occipital infarcts concerning for embolic origin; she underwent loop recorder implantation (WC)  hypercoagulable work-up was negative.  She ended up with a TEE with question of a interatrial septal aneurysm.  Bubble study was negative.  Because of equivocal results, she underwent TCD which was negative Recurrent stroke 1/22  w/u neg in 722 with blood pressure 204/122.  Given TNK without residual.  Seen by Dr. Corrin Parker 7/22 with notes complaining of orthostasis and inappropriate tachycardia.  Antecedent history of COVID-pneumonia 12/21 managed with remdesivir; symptoms however antedate COVID.  Long history of shower intolerance, heat intolerance, orthostatic intolerance, exercise intolerance with tachypalpitations.  He started dig; given history of hypotension and orthostatic intolerance    Data below identifies her mean heart rate over the last 5 years has been about 100; previously had been on beta-blockers       Orthostatic lightheadedness is much improved on the ProAmatine.      History of PVCs.  Monitor 1/23 showed less than 1%.  Worsening dyspnea.  More recently  Date PVCS  6/23 7.8%  8/24 <1%      Recurrent "spells "gradual but progressive onset of inability to stay awake.  Blood pressure within about 10 minutes of these events is normal and she is not described as pale  The patient denies chest pain, shortness of breath,  nocturnal dyspnea, orthopnea or peripheral edema.  There have been no palpitations, lightheadedness or syncope.      Also frightened about cardiac arrest  DATE TEST EF   11/18 Echo   55-60 %   2/22 TEE    4/23 Myoview  80% No Ischemia "Attenuation images-no evidence of coronary calcifications  6/23 Echo  60-65%   6/24 Echo  60-65%    Date Cr K Hgb  9/22 1.01 4.4 15.0    4/24  0.98 4.0 13.6  8/24 1.04 4.5 14.2 (7/24)    Records and Results Reviewed  Past Medical History:  Diagnosis Date   Anemia    Anxiety    Depression    Dyspnea    SINCE STROKE   GERD (gastroesophageal reflux disease)    Hyperlipidemia    Hypertension    IBS (irritable bowel syndrome)    Migraine    Previous cesarean delivery, delivered, with or without mention of antepartum condition 01/19/2012   Psychogenic nonepileptic seizure    hx/notes 12/19/2016-LAST SEIZURE IN 2019-ON NO MEDS AS OF 05-24-19   Restless leg syndrome    Sleep apnea    USES CPAP   Stroke (HCC) 12/18/2016   Acute arterial ischemic stroke, multifocal, posterior circulation /notes 12/19/2016-LEFT SIDED WEAKNESS, MEMORY FATIGUE AND TROUBLE FINDING HER WORDS [facility]    Past Surgical History:  Procedure Laterality Date   ABDOMINOPLASTY  Feb. 2015   CESAREAN SECTION  2011   CHOLECYSTECTOMY N/A 05/31/2019   Procedure: LAPAROSCOPIC CHOLECYSTECTOMY WITH INTRAOPERATIVE CHOLANGIOGRAM;  Surgeon: Earline Mayotte, MD;  Location: [facility] ORS;  Service: General;  Laterality: N/A;   COLONOSCOPY WITH PROPOFOL N/A 11/26/2016   Procedure:  COLONOSCOPY WITH PROPOFOL;  Surgeon: Wyline Mood, MD;  Location[facility] ENDOSCOPY;  Service: Gastroenterology;  Laterality: N/A;   COLONOSCOPY WITH PROPOFOL N/A 08/08/2020   Procedure: COLONOSCOPY WITH PROPOFOL;  Surgeon: Regis Bill, MD;  Location: [facility] ENDOSCOPY;  Service: Endoscopy;  Laterality: N/A;   CYSTOSCOPY  02/02/2015   Procedure: CYSTOSCOPY;  Surgeon: Nadara Mustard, MD;  Location: [facility] ORS;  Service:  Gynecology;;   DILATION AND CURETTAGE OF UTERUS  2003, 2005, 2008   ESOPHAGOGASTRODUODENOSCOPY N/A 05/31/2019   Procedure: ESOPHAGOGASTRODUODENOSCOPY (EGD);  Surgeon: Earline Mayotte, MD;  Location: [facility] ORS;  Service: General;  Laterality: N/A;  in the O.R.   ESOPHAGOGASTRODUODENOSCOPY (EGD) WITH PROPOFOL N/A 11/26/2016   Procedure: ESOPHAGOGASTRODUODENOSCOPY (EGD) WITH PROPOFOL;  Surgeon: Wyline Mood, MD;  Location: [facility] ENDOSCOPY;  Service: Gastroenterology;  Laterality: N/A;   HERNIA REPAIR  1999   Umbilical   KNEE ARTHROSCOPY Right 2006   LAPAROSCOPIC BILATERAL SALPINGECTOMY Bilateral 02/02/2015   Procedure: LAPAROSCOPIC BILATERAL SALPINGECTOMY;  Surgeon: Nadara Mustard, MD;  Location: [facility] ORS;  Service: Gynecology;  Laterality: Bilateral;   LAPAROSCOPIC HYSTERECTOMY N/A 02/02/2015   Procedure: HYSTERECTOMY TOTAL LAPAROSCOPIC;  Surgeon: Nadara Mustard, MD;  Location: [facility] ORS;  Service: Gynecology;  Laterality: N/A;   LOOP RECORDER INSERTION N/A 12/20/2016   Procedure: LOOP RECORDER INSERTION;  Surgeon: Regan Lemming, MD;  Location: [facility] INVASIVE CV LAB;  Service: Cardiovascular;  Laterality: N/A;   REDUCTION MAMMAPLASTY Bilateral December 2015   TEE WITHOUT CARDIOVERSION N/A 12/20/2016   Procedure: TRANSESOPHAGEAL ECHOCARDIOGRAM (TEE);  Surgeon: Lars Masson, MD;  Location: [facility] ENDOSCOPY;  Service: Cardiovascular;  Laterality: N/A;   TEE WITHOUT CARDIOVERSION N/A 03/29/2020   Procedure: TRANSESOPHAGEAL ECHOCARDIOGRAM (TEE);  Surgeon: Debbe Odea, MD;  Location: [facility] ORS;  Service: Cardiovascular;  Laterality: N/A;    Current Meds  Medication Sig   ALPRAZolam (XANAX XR) 1 MG 24 hr tablet Take 1 tablet (1 mg total) by mouth every morning.   cyclobenzaprine (FLEXERIL) 10 MG tablet Take 1 tablet (10 mg total) by mouth at bedtime.   fluticasone furoate-vilanterol (BREO ELLIPTA) 100-25 MCG/ACT AEPB Inhale 1 puff into the lungs daily.   gabapentin (NEURONTIN) 100 MG capsule  Take 100 mg by mouth 3 (three) times daily.   lamoTRIgine (LAMICTAL) 100 MG tablet Take 100 mg by mouth 2 (two) times daily.   midodrine (PROAMATINE) 5 MG tablet TAKE 1 TABLET BY MOUTH THREE TIMES DAILY AT 7 AM, 11 AM, AND 3 PM   PARoxetine (PAXIL-CR) 25 MG 24 hr tablet TAKE 2 TABLET BY MOUTH DAILY   Semaglutide,0.25 or 0.5MG /DOS, (OZEMPIC, 0.25 OR 0.5 MG/DOSE,) 2 MG/3ML SOPN Inject 0.25-0.5 mg into the skin once a week.   sodium chloride 1 g tablet Take 1 tablet (1 g total) by mouth 3 (three) times daily with meals.   UBRELVY 100 MG TABS Take 100 mg by mouth daily as needed (migraine).    Vitamin D, Ergocalciferol, (DRISDOL) 1.25 MG (50000 UNIT) CAPS capsule Take 1 capsule (50,000 Units total) by mouth every 7 (seven) days.   zolpidem (AMBIEN CR) 12.5 MG CR tablet Take 1 tablet (12.5 mg total) by mouth at bedtime as needed for sleep.   [DISCONTINUED] clopidogrel (PLAVIX) 75 MG tablet Take 75 mg by mouth daily.    Allergies  Allergen Reactions   Azithromycin Diarrhea   Penicillins Hives and Swelling    Has patient had a PCN reaction causing immediate rash, facial/tongue/throat swelling, SOB or lightheadedness with hypotension: Yes Has patient had  a PCN reaction causing severe rash involving mucus membranes or skin necrosis: No Has patient had a PCN reaction that required hospitalization No Has patient had a PCN reaction occurring within the last 10 years: No If all of the above answers are "NO", then may proceed with Cephalosporin use.      Review of Systems negative except from HPI and PMH  Physical Exam BP 115/63 (BP Location: Left Arm, Patient Position: Sitting, Cuff Size: Normal)   Pulse 98   Ht 5\' 8"  (1.727 m)   Wt 212 lb 12.8 oz (96.5 kg)   LMP 01/15/2015   SpO2 95%   BMI 32.36 kg/m  Well developed and nourished in no acute distress HENT normal Neck supple with JVP-   Clear Regular rate and rhythm, no murmurs or gallops Abd-soft with active BS No Clubbing cyanosis  edema Skin-warm and dry A & Oriented  Grossly normal sensory and motor function  ECG sinus at 98 0 16/08/36  Assessment and  Plan  Cryptogenic stroke  Aortic septal aneurysm without PFO  Dysautonomia-- inappropriate sinus tachycardia/orthostatic intolerance  PVCs left bundle inferior axis  PVCs are quiescient.  She is thrilled.  Dizziness is relatively minimal.  Continue ProAmatine  With her prior history of stroke will continue Plavix  Atorvastatin stopped apparently because of myalgias.  Will resume with rosuvastatin at a low dose 10 mg a day and see how she tolerates it.           Sherryl Manges, MD 02/17/2023 6:28 PM

## 2023-02-18 MED ORDER — ROSUVASTATIN CALCIUM 10 MG PO TABS: 10.0000 mg | ORAL_TABLET | Freq: Every day | ORAL | 3 refills | Status: DC

## 2023-02-18 NOTE — Addendum Note (Signed)
Addended by: Alois Cliche on: 02/18/2023 11:25 AM   Modules accepted: Orders

## 2023-02-25 DIAGNOSIS — I10 Essential (primary) hypertension: Secondary | ICD-10-CM | POA: Diagnosis not present

## 2023-02-25 DIAGNOSIS — G4733 Obstructive sleep apnea (adult) (pediatric): Secondary | ICD-10-CM | POA: Diagnosis not present

## 2023-02-25 DIAGNOSIS — I63442 Cerebral infarction due to embolism of left cerebellar artery: Secondary | ICD-10-CM | POA: Diagnosis not present

## 2023-02-25 DIAGNOSIS — Z01818 Encounter for other preprocedural examination: Secondary | ICD-10-CM | POA: Diagnosis not present

## 2023-03-14 DIAGNOSIS — I10 Essential (primary) hypertension: Secondary | ICD-10-CM | POA: Diagnosis not present

## 2023-03-14 DIAGNOSIS — R932 Abnormal findings on diagnostic imaging of liver and biliary tract: Secondary | ICD-10-CM | POA: Diagnosis not present

## 2023-03-14 DIAGNOSIS — R1011 Right upper quadrant pain: Secondary | ICD-10-CM | POA: Diagnosis not present

## 2023-03-14 DIAGNOSIS — Z881 Allergy status to other antibiotic agents status: Secondary | ICD-10-CM | POA: Diagnosis not present

## 2023-03-14 DIAGNOSIS — K862 Cyst of pancreas: Secondary | ICD-10-CM | POA: Diagnosis not present

## 2023-03-14 DIAGNOSIS — K2970 Gastritis, unspecified, without bleeding: Secondary | ICD-10-CM | POA: Diagnosis not present

## 2023-03-14 DIAGNOSIS — K219 Gastro-esophageal reflux disease without esophagitis: Secondary | ICD-10-CM | POA: Diagnosis not present

## 2023-03-14 DIAGNOSIS — K2950 Unspecified chronic gastritis without bleeding: Secondary | ICD-10-CM | POA: Diagnosis not present

## 2023-03-14 DIAGNOSIS — K269 Duodenal ulcer, unspecified as acute or chronic, without hemorrhage or perforation: Secondary | ICD-10-CM | POA: Diagnosis not present

## 2023-03-14 DIAGNOSIS — Q453 Other congenital malformations of pancreas and pancreatic duct: Secondary | ICD-10-CM | POA: Diagnosis not present

## 2023-03-14 DIAGNOSIS — Z880 Allergy status to penicillin: Secondary | ICD-10-CM | POA: Diagnosis not present

## 2023-03-17 ENCOUNTER — Encounter: Payer: Self-pay | Admitting: Family Medicine

## 2023-03-17 ENCOUNTER — Other Ambulatory Visit: Payer: Self-pay | Admitting: Internal Medicine

## 2023-03-17 ENCOUNTER — Encounter: Payer: Self-pay | Admitting: Internal Medicine

## 2023-03-17 MED ORDER — MIDODRINE HCL 5 MG PO TABS: ORAL_TABLET | ORAL | 3 refills | Status: DC

## 2023-03-18 NOTE — Telephone Encounter (Signed)
Disp Refills Start End   midodrine (PROAMATINE) 5 MG tablet 270 tablet 3 03/17/2023 --   Sig: TAKE 1 TABLET BY MOUTH THREE TIMES DAILY AT 7 AM, 11 AM, AND 3 PM   Sent to pharmacy as: midodrine (PROAMATINE) 5 MG tablet   Notes to Pharmacy: Please contact office for future appointment and refills, Thank you [phone]   E-Prescribing Status: Receipt confirmed by pharmacy (03/17/2023  2:54 PM EST)

## 2023-03-26 ENCOUNTER — Encounter: Payer: Self-pay | Admitting: Family Medicine

## 2023-03-26 ENCOUNTER — Ambulatory Visit: Payer: BC Managed Care – PPO | Admitting: Family Medicine

## 2023-03-26 VITALS — BP 132/84 | HR 108 | Temp 98.1°F | Resp 16 | Ht 68.0 in | Wt 216.7 lb

## 2023-03-26 DIAGNOSIS — I639 Cerebral infarction, unspecified: Secondary | ICD-10-CM | POA: Diagnosis not present

## 2023-03-26 DIAGNOSIS — R591 Generalized enlarged lymph nodes: Secondary | ICD-10-CM

## 2023-03-26 DIAGNOSIS — I69354 Hemiplegia and hemiparesis following cerebral infarction affecting left non-dominant side: Secondary | ICD-10-CM

## 2023-03-26 DIAGNOSIS — R300 Dysuria: Secondary | ICD-10-CM

## 2023-03-26 DIAGNOSIS — F330 Major depressive disorder, recurrent, mild: Secondary | ICD-10-CM

## 2023-03-26 DIAGNOSIS — E538 Deficiency of other specified B group vitamins: Secondary | ICD-10-CM | POA: Diagnosis not present

## 2023-03-26 DIAGNOSIS — E1169 Type 2 diabetes mellitus with other specified complication: Secondary | ICD-10-CM

## 2023-03-26 DIAGNOSIS — J683 Other acute and subacute respiratory conditions due to chemicals, gases, fumes and vapors: Secondary | ICD-10-CM

## 2023-03-26 DIAGNOSIS — F411 Generalized anxiety disorder: Secondary | ICD-10-CM

## 2023-03-26 DIAGNOSIS — R569 Unspecified convulsions: Secondary | ICD-10-CM

## 2023-03-26 DIAGNOSIS — G8929 Other chronic pain: Secondary | ICD-10-CM

## 2023-03-26 DIAGNOSIS — M542 Cervicalgia: Secondary | ICD-10-CM

## 2023-03-26 DIAGNOSIS — E785 Hyperlipidemia, unspecified: Secondary | ICD-10-CM

## 2023-03-26 DIAGNOSIS — G901 Familial dysautonomia [Riley-Day]: Secondary | ICD-10-CM | POA: Diagnosis not present

## 2023-03-26 DIAGNOSIS — F5104 Psychophysiologic insomnia: Secondary | ICD-10-CM

## 2023-03-26 LAB — POCT URINALYSIS DIPSTICK
Bilirubin, UA: NEGATIVE
Glucose, UA: NEGATIVE
Ketones, UA: NEGATIVE
Nitrite, UA: NEGATIVE
Protein, UA: NEGATIVE
Spec Grav, UA: 1.02 (ref 1.010–1.025)
Urobilinogen, UA: 0.2 U/dL
pH, UA: 6 (ref 5.0–8.0)

## 2023-03-26 LAB — POCT GLYCOSYLATED HEMOGLOBIN (HGB A1C): Hemoglobin A1C: 6.2 % — AB (ref 4.0–5.6)

## 2023-03-26 LAB — GLUCOSE, POCT (MANUAL RESULT ENTRY): POC Glucose: 155 mg/dL — AB (ref 70–99)

## 2023-03-26 MED ORDER — FLUTICASONE FUROATE-VILANTEROL 100-25 MCG/ACT IN AEPB
1.0000 | INHALATION_SPRAY | Freq: Every day | RESPIRATORY_TRACT | 1 refills | Status: AC
Start: 2023-03-26 — End: ?

## 2023-03-26 MED ORDER — CLOPIDOGREL BISULFATE 75 MG PO TABS: 75.0000 mg | ORAL_TABLET | Freq: Every day | ORAL | 0 refills | Status: DC

## 2023-03-26 MED ORDER — OZEMPIC (0.25 OR 0.5 MG/DOSE) 2 MG/3ML ~~LOC~~ SOPN
0.2500 mg | PEN_INJECTOR | SUBCUTANEOUS | 0 refills | Status: DC
Start: 2023-03-26 — End: 2023-04-23

## 2023-03-26 MED ORDER — ZOLPIDEM TARTRATE ER 12.5 MG PO TBCR
12.5000 mg | EXTENDED_RELEASE_TABLET | Freq: Every evening | ORAL | 0 refills | Status: DC | PRN
Start: 2023-03-26 — End: 2023-06-18

## 2023-03-26 MED ORDER — CYANOCOBALAMIN 1000 MCG/ML IJ SOLN
1000.0000 ug | Freq: Once | INTRAMUSCULAR | Status: AC
  Administered 2023-03-26: 1000 ug via INTRAMUSCULAR

## 2023-03-26 MED ORDER — ALPRAZOLAM ER 1 MG PO TB24
1.0000 mg | ORAL_TABLET | ORAL | 0 refills | Status: DC
Start: 2023-03-26 — End: 2023-06-18

## 2023-03-26 MED ORDER — PAROXETINE HCL ER 25 MG PO TB24
ORAL_TABLET | ORAL | 1 refills | Status: DC
Start: 2023-03-26 — End: 2023-09-10

## 2023-03-26 MED ORDER — NITROFURANTOIN MONOHYD MACRO 100 MG PO CAPS: 100.0000 mg | ORAL_CAPSULE | Freq: Two times a day (BID) | ORAL | 0 refills | Status: DC

## 2023-03-26 NOTE — Progress Notes (Signed)
 Name: Alicia [name] Note Clift   MRN: [id number]    DOB: 12-15-1970   Date:03/26/2023       Progress Note  Subjective  Chief Complaint  Chief Complaint  Patient presents with   Medical Management of Chronic Issues   HPI   CVA with hemiparesis : 12/18/2016, admitted to [facility] , mild distal left PICA. She is feeling better, but still has lack of balance but not using a cane, episodes of dysarthria, continues to have tremors, and has some left side weakness .  She still has some short term memory difficulties and difficulty finding the correct words  to express herself at times.  She had at least 4 seizure like activities first one in 2018 ,  09/2017, had one in April after surgery 05/2019, she states had a similar episode Fall 2022 .She states when at work and super tired she falls asleep on her desk. She feels very sleepy and lethargic and has to put her head down, it can last 8-9 minutes and when she wakes up she is able to function. At times she has episodes of nausea, gets confused and has to rest She  is taking Crestor . She recently went to [facility] - Dr. Hosie  02/2021 , had repeat MRI and advised to stop aspirin  and start Plavix   since studies showed not responsive to aspirin , there is still a concern for mild FMD in her cervical ICA and vertebral arteries. Possible PFO but cardiac MRI negative for cardio embolic source. She had another episode of confusion and left side weakness on 07/23  but CTA, MRI and CT all negative except for old lacunar infarct - she states still feeling weak on left side and some dysarthria , she has mental fogginess. She has FMLA and able to work from home about once a week. We tried PA for Ozempic  but insurance refused   Summary of transcranial doppler 05/30/2020 No HITS at rest or during Valsalva. Negative transcranial Doppler Bubble study with no evidence of right to left intracardiac communication. A vascular evaluation was performed. The right middle  cerebral artery was studied. An IV was inserted into the patient's left antecubital. Verbal informed consent was obtained. Negative TCD Bubble study   Cardiac MRI heart: 12/11/2020    Normal EF, unremarkable   Migraine : She is under the care of Dr. Maree. Currently on Lamictal  and also prn Ubrelvy . She states described as a band around her head and associated with phonophobia and photophobia. Lately episodes about 2 times per month, but sometimes back to back episodes. g   Orthostatic hypotension with tachycardia/Dysautonomia :   and had  TEE for evaluation of TIA's and strokes to rule out foramen ovale - so far no structural heart disease found. She is taking midodrine  given by Dr. Fernande. 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 She already had some brain fogginess before COVID-19 but feels worse since COVID , she also has intermittent tightness in the middle of her back  COVID-19, she had a second episode of COVID-05 Feb 2021. She feels like myalgia and fatigues , she sees a psyiatrist and is taking gabapentin  that helps with radiculitis - down both arms -  symptoms but not with joint and muscle pain . Stable    GAD/MDD : Husband died suddenly of a brain aneurysm back in  October 23 rd, 2016.  She  has been taking her antidepressant medication and Alprazolam  as prescribed.  She remarried her high school sweet heart 06/07/2016 ( and he moved here from [place] )  Had a stroke 12/2016. She is stable on current dose of Paxil      Hypertriglyceridemia: currently off Vascepa  due to cost and nausea, she also stopped taking Crestor  but states it was causing her headaches and muscle pain ( she tried twice to take statins and had side effects both times) . LDL during hospital stay was up to 86, HDL 42 , triglycerides was up. She is taking a otc supplement. We will try adding Ozempic  again since now has DM and it  will also decrease  risk of strokes/cardiovascular benefits    Diabetes type 2 with dyslipidemia: : last A1C was 6.1 % back in August 2024, she has noticed urinary frequency, polyuria, glucose fasting at home has been between 113-140's, today it was 155 fasting, last year she had two fasting glucoses above 126 on our lab - making the diagnosis of DM, She has dyslipidemia, obesity. She denies polyphagia, polydipsia or polyuria.  She would like to start on GLP-1 agonist due to other benefits on cardiovascular protection     Insomnia: taking Ambien  and is taking her less than 30 minutes to fall asleep .She is aware of long term risk of Ambien , but states cannot sleep without it. She also knows that the dose she is taking is not FDA approved for females.Continue medication    Neck and back pain: currently seeing psychiatrist , taking Flexeril  , still cannot afford PT. She had a positive HLA B27. She is now also taking gabapentin  100 mg in am, at lunch and 300 mg at night.  Stable on current regiment .  Lymphadenopathy: she continues to have neck fullness and tenderness that seems to be getting worse  Previous US  showed:    IMPRESSION: 12/2019 . She states she continues to have a fullness on her neck 1. Mildly heterogeneous but otherwise unremarkable thyroid  gland without evidence of nodule. 2. The palpable abnormality corresponds with a small 0.6 cm lymph node. Favor reactive lymphadenopathy. Recommend continued clinical surveillance. If there is evidence of growth over time, repeat imaging and potentially biopsy may become warranted.   GERD: she had an esophogram, barium swallow done on 05/19/2019 that showed mild GERD, she was able to stop PPI and is doing well, still only taking Tums prn Stable    OSA: wearing CPAP every night and she states it helps with her fatigue . Unchanged    Reactive Airway Dysfunction syndrome: she saw Dedra Skelton, pulmonologist, advised to stop Symbicort  , take  prn albuterol  and is now on Breo . She had COVID Dec 2021 and again Dec 2022 - she continues to have intermittent SOB, usually triggered by activity .She needs a refill of Breo   Leukocytoclastic vasculitis: based on skin biopsy done by Dr. Hester, multiple labs done and negative, she saw  Dr. Jeannetta in the past and is under the care of Dr. Tobie  first visit in early 2024. No current lesions   B12 deficiency: getting monthly injections, last level was 371 . Stable  Cystitis: she has urinary urgency, hesitancy and some dysuria on and off for months, this episodes started yesterday     Patient Active Problem List   Diagnosis Date Noted   Cryptogenic stroke (HCC) 09/09/2022   PVC's (premature ventricular contractions) 09/09/2022   Dysautonomia (HCC) 04/03/2022   Hemiparesis of left nondominant side as late  effect of cerebral infarction (HCC) 04/03/2022   Mild episode of recurrent major depressive disorder (HCC) 04/03/2022   HLA B27 (HLA B27 positive) 04/03/2022   Essential hypertension 08/30/2021   COVID-19 long hauler manifesting chronic fatigue 03/21/2020   Orthostatic hypotension 02/23/2020   Atypical chest pain 02/23/2020   Reactive airways dysfunction syndrome (HCC) 10/15/2019   Hyperfunction of pituitary gland, unspecified (HCC) 10/15/2019   Calculus of gallbladder without cholecystitis without obstruction 05/06/2019   Cyst of pancreas 05/06/2019   Right kidney stone 05/06/2019   Leukocytoclastic vasculitis (HCC) 10/07/2018   Chronic insomnia 02/09/2018   History of anemia 02/09/2018   Sleep apnea 09/24/2017   History of ischemic stroke 05/30/2017   Hyperlipidemia 05/30/2017   Migraine without aura and without status migrainosus, not intractable 05/27/2017   Pain in right knee 01/21/2017   Psychogenic nonepileptic seizure    Chronic pain syndrome    Restless leg syndrome    Chronic prescription benzodiazepine use 01/20/2016   Leukocytosis 01/20/2016   Seizure-like activity  (HCC)    GAD (generalized anxiety disorder) 02/14/2015   Chronic neck pain 02/14/2015   History of hysterectomy 02/14/2015   Iron deficiency anemia due to chronic blood loss 08/29/2014   Insomnia, persistent 08/14/2014   Major depression (HCC) 08/14/2014   Temporomandibular joint sounds on opening and/or closing the jaw 08/14/2014   Degenerative disc disease, lumbar 08/14/2014   Bleeding internal hemorrhoids 08/14/2014   Gastric reflux 08/14/2014   Irritable bowel syndrome with constipation 08/14/2014   Hypertriglyceridemia 08/14/2014   Overweight 08/14/2014   Tinnitus 08/14/2014   Vitamin D  deficiency 08/14/2014   Sinus tachycardia 11/25/2012   DOE (dyspnea on exertion) 11/06/2012    Past Surgical History:  Procedure Laterality Date   ABDOMINOPLASTY  Feb. 2015   CESAREAN SECTION  2011   CHOLECYSTECTOMY N/A 05/31/2019   Procedure: LAPAROSCOPIC CHOLECYSTECTOMY WITH INTRAOPERATIVE CHOLANGIOGRAM;  Surgeon: Dessa Reyes ORN, MD;  Location: [facility] ORS;  Service: General;  Laterality: N/A;   COLONOSCOPY WITH PROPOFOL  N/A 11/26/2016   Procedure: COLONOSCOPY WITH PROPOFOL ;  Surgeon: Therisa Bi, MD;  Location: [facility] ENDOSCOPY;  Service: Gastroenterology;  Laterality: N/A;   COLONOSCOPY WITH PROPOFOL  N/A 08/08/2020   Procedure: COLONOSCOPY WITH PROPOFOL ;  Surgeon: Maryruth Ole DASEN, MD;  Location: [facility] ENDOSCOPY;  Service: Endoscopy;  Laterality: N/A;   CYSTOSCOPY  02/02/2015   Procedure: CYSTOSCOPY;  Surgeon: Lamar SHAUNNA Lesches, MD;  Location: [facility] ORS;  Service: Gynecology;;   DILATION AND CURETTAGE OF UTERUS  2003, 2005, 2008   ESOPHAGOGASTRODUODENOSCOPY N/A 05/31/2019   Procedure: ESOPHAGOGASTRODUODENOSCOPY (EGD);  Surgeon: Dessa Reyes ORN, MD;  Location: [facility] ORS;  Service: General;  Laterality: N/A;  in the O.R.   ESOPHAGOGASTRODUODENOSCOPY (EGD) WITH PROPOFOL  N/A 11/26/2016   Procedure: ESOPHAGOGASTRODUODENOSCOPY (EGD) WITH PROPOFOL ;  Surgeon: Therisa Bi, MD;  Location: [facility] ENDOSCOPY;   Service: Gastroenterology;  Laterality: N/A;   HERNIA REPAIR  1999   Umbilical   KNEE ARTHROSCOPY Right 2006   LAPAROSCOPIC BILATERAL SALPINGECTOMY Bilateral 02/02/2015   Procedure: LAPAROSCOPIC BILATERAL SALPINGECTOMY;  Surgeon: Lamar SHAUNNA Lesches, MD;  Location: [facility] ORS;  Service: Gynecology;  Laterality: Bilateral;   LAPAROSCOPIC HYSTERECTOMY N/A 02/02/2015   Procedure: HYSTERECTOMY TOTAL LAPAROSCOPIC;  Surgeon: Lamar SHAUNNA Lesches, MD;  Location: [facility] ORS;  Service: Gynecology;  Laterality: N/A;   LOOP RECORDER INSERTION N/A 12/20/2016   Procedure: LOOP RECORDER INSERTION;  Surgeon: Inocencio Soyla Lunger, MD;  Location: [facility] INVASIVE CV LAB;  Service: Cardiovascular;  Laterality: N/A;   REDUCTION MAMMAPLASTY Bilateral December 2015  TEE WITHOUT CARDIOVERSION N/A 12/20/2016   Procedure: TRANSESOPHAGEAL ECHOCARDIOGRAM (TEE);  Surgeon: Maranda Leim DEL, MD;  Location: [facility] ENDOSCOPY;  Service: Cardiovascular;  Laterality: N/A;   TEE WITHOUT CARDIOVERSION N/A 03/29/2020   Procedure: TRANSESOPHAGEAL ECHOCARDIOGRAM (TEE);  Surgeon: Darliss Rogue, MD;  Location: [facility] ORS;  Service: Cardiovascular;  Laterality: N/A;    Family History  Problem Relation Age of Onset   Hypertension Mother    Hyperlipidemia Mother    Heart Problems Father        hole in heart and lower ventricles reversed   Prostate cancer Maternal Grandfather    Von Willebrand disease Maternal Uncle     Social History   Tobacco Use   Smoking status: Never   Smokeless tobacco: Never  Substance Use Topics   Alcohol use: No    Alcohol/week: 0.0 standard drinks of alcohol     Current Outpatient Medications:    cyclobenzaprine  (FLEXERIL ) 10 MG tablet, Take 1 tablet (10 mg total) by mouth at bedtime., Disp: 90 tablet, Rfl: 1   gabapentin  (NEURONTIN ) 100 MG capsule, Take 100 mg by mouth 3 (three) times daily., Disp: , Rfl:    lamoTRIgine  (LAMICTAL ) 100 MG tablet, Take 100 mg by mouth 2 (two) times daily., Disp: , Rfl:     midodrine  (PROAMATINE ) 5 MG tablet, TAKE 1 TABLET BY MOUTH THREE TIMES DAILY AT 7 AM, 11 AM, AND 3 PM, Disp: 270 tablet, Rfl: 3   sodium chloride  1 g tablet, Take 1 tablet (1 g total) by mouth 3 (three) times daily with meals., Disp: 270 tablet, Rfl: 3   UBRELVY  100 MG TABS, Take 100 mg by mouth daily as needed (migraine). , Disp: , Rfl:    Vitamin D , Ergocalciferol , (DRISDOL ) 1.25 MG (50000 UNIT) CAPS capsule, Take 1 capsule (50,000 Units total) by mouth every 7 (seven) days., Disp: 12 capsule, Rfl: 1   ALPRAZolam  (XANAX  XR) 1 MG 24 hr tablet, Take 1 tablet (1 mg total) by mouth every morning., Disp: 90 tablet, Rfl: 0   clopidogrel  (PLAVIX ) 75 MG tablet, Take 1 tablet (75 mg total) by mouth daily., Disp: 90 tablet, Rfl: 0   fluticasone  furoate-vilanterol (BREO ELLIPTA ) 100-25 MCG/ACT AEPB, Inhale 1 puff into the lungs daily., Disp: 180 each, Rfl: 1   PARoxetine  (PAXIL -CR) 25 MG 24 hr tablet, TAKE 2 TABLET BY MOUTH DAILY, Disp: 180 tablet, Rfl: 1   Semaglutide ,0.25 or 0.5MG /DOS, (OZEMPIC , 0.25 OR 0.5 MG/DOSE,) 2 MG/3ML SOPN, Inject 0.25-0.5 mg into the skin once a week., Disp: 3 mL, Rfl: 0   zolpidem  (AMBIEN  CR) 12.5 MG CR tablet, Take 1 tablet (12.5 mg total) by mouth at bedtime as needed for sleep., Disp: 90 tablet, Rfl: 0  Allergies  Allergen Reactions   Azithromycin  Diarrhea   Penicillins Hives and Swelling    Has patient had a PCN reaction causing immediate rash, facial/tongue/throat swelling, SOB or lightheadedness with hypotension: Yes Has patient had a PCN reaction causing severe rash involving mucus membranes or skin necrosis: No Has patient had a PCN reaction that required hospitalization No Has patient had a PCN reaction occurring within the last 10 years: No If all of the above answers are NO, then may proceed with Cephalosporin use.    I personally reviewed active problem list, medication list, allergies with the patient/caregiver today.   ROS  Ten systems reviewed and is  negative except as mentioned in HPI    Objective  Vitals:   03/26/23 0823 03/26/23 0824  BP: 132/84  Pulse: (!) 122 (!) 108  Resp: 16   Temp: 98.1 F (36.7 C)   TempSrc: Oral   SpO2: 94%   Weight: 216 lb 11.2 oz (98.3 kg)   Height: 5' 8 (1.727 m)     Body mass index is 32.95 kg/m.  Physical Exam  Constitutional: Patient appears well-developed and well-nourished. Obese  No distress.  HEENT: head atraumatic, normocephalic, pupils equal and reactive to light, , neck supple, some tender anterior lymphadenopathy Cardiovascular: Normal rate, regular rhythm and normal heart sounds.  No murmur heard. No BLE edema. Pulmonary/Chest: Effort normal and breath sounds normal. No respiratory distress. Abdominal: Soft.  There is no tenderness. Psychiatric: Patient has a normal mood and affect. behavior is normal. Judgment and thought content normal.   Recent Results (from the past 2160 hours)  Resp panel by RT-PCR (RSV, Flu A&B, Covid) Anterior Nasal Swab     Status: None   Collection Time: 01/14/23  2:41 AM   Specimen: Anterior Nasal Swab  Result Value Ref Range   SARS Coronavirus 2 by RT PCR NEGATIVE NEGATIVE    Comment: (NOTE) SARS-CoV-2 target nucleic acids are NOT DETECTED.  The SARS-CoV-2 RNA is generally detectable in upper respiratory specimens during the acute phase of infection. The lowest concentration of SARS-CoV-2 viral copies this assay can detect is 138 copies/mL. A negative result does not preclude SARS-Cov-2 infection and should not be used as the sole basis for treatment or other patient management decisions. A negative result may occur with  improper specimen collection/handling, submission of specimen other than nasopharyngeal swab, presence of viral mutation(s) within the areas targeted by this assay, and inadequate number of viral copies(<138 copies/mL). A negative result must be combined with clinical observations, patient history, and  epidemiological information. The expected result is Negative.  Fact Sheet for Patients:  bloggercourse.com  Fact Sheet for Healthcare Providers:  seriousbroker.it  This test is no t yet approved or cleared by the [place]  FDA and  has been authorized for detection and/or diagnosis of SARS-CoV-2 by FDA under an Emergency Use Authorization (EUA). This EUA will remain  in effect (meaning this test can be used) for the duration of the COVID-19 declaration under Section 564(b)(1) of the Act, 21 U.S.C.section 360bbb-3(b)(1), unless the authorization is terminated  or revoked sooner.       Influenza A by PCR NEGATIVE NEGATIVE   Influenza B by PCR NEGATIVE NEGATIVE    Comment: (NOTE) The Xpert Xpress SARS-CoV-2/FLU/RSV plus assay is intended as an aid in the diagnosis of influenza from Nasopharyngeal swab specimens and should not be used as a sole basis for treatment. Nasal washings and aspirates are unacceptable for Xpert Xpress SARS-CoV-2/FLU/RSV testing.  Fact Sheet for Patients: bloggercourse.com  Fact Sheet for Healthcare Providers: seriousbroker.it  This test is not yet approved or cleared by the [place]  FDA and has been authorized for detection and/or diagnosis of SARS-CoV-2 by FDA under an Emergency Use Authorization (EUA). This EUA will remain in effect (meaning this test can be used) for the duration of the COVID-19 declaration under Section 564(b)(1) of the Act, 21 U.S.C. section 360bbb-3(b)(1), unless the authorization is terminated or revoked.     Resp Syncytial Virus by PCR NEGATIVE NEGATIVE    Comment: (NOTE) Fact Sheet for Patients: bloggercourse.com  Fact Sheet for Healthcare Providers: seriousbroker.it  This test is not yet approved or cleared by the [place]  FDA and has been authorized for  detection and/or diagnosis of SARS-CoV-2 by FDA under an  Emergency Use Authorization (EUA). This EUA will remain in effect (meaning this test can be used) for the duration of the COVID-19 declaration under Section 564(b)(1) of the Act, 21 U.S.C. section 360bbb-3(b)(1), unless the authorization is terminated or revoked.  Performed at [facility], [address] [place], [place] [address]   POCT glucose (manual entry)     Status: Abnormal   Collection Time: 03/26/23  8:45 AM  Result Value Ref Range   POC Glucose 155 (A) 70 - 99 mg/dl  POCT HgB J8R     Status: Abnormal   Collection Time: 03/26/23  8:53 AM  Result Value Ref Range   Hemoglobin A1C 6.2 (A) 4.0 - 5.6 %   HbA1c POC (<> result, manual entry)     HbA1c, POC (prediabetic range)     HbA1c, POC (controlled diabetic range)      Diabetic Foot Exam:  Title   Diabetic Foot Exam - detailed Visual Foot Exam completed.: Yes   Pulse Foot Exam completed.: Yes      Sensory Foot Exam Completed.: Yes Semmes-Weinstein Monofilament Test + means has sensation and - means no sensation      Image components are not supported.   Image components are not supported. Image components are not supported.  [place] Comments      PHQ2/9:    03/26/2023    8:23 AM 12/24/2022    7:45 AM 10/02/2022    8:02 AM 06/24/2022    8:52 AM 04/03/2022    9:42 AM  Depression screen PHQ 2/9  Decreased Interest 0 0 1 0 1  Down, Depressed, Hopeless 0 0 0 0 1  PHQ - 2 Score 0 0 1 0 2  Altered sleeping 0  1 0 1  Tired, decreased energy 0  1 1 1   Change in appetite 0  0 0 1  Feeling bad or failure about yourself  0  0 0 0  Trouble concentrating 0  0 1 0  Moving slowly or fidgety/restless 0  0 0 0  Suicidal thoughts 0  0 0 0  PHQ-9 Score 0  3 2 5   Difficult doing work/chores Not difficult at all    Somewhat difficult    phq 9 is negative  Fall Risk:    03/26/2023    8:20 AM 12/24/2022    7:45 AM 10/02/2022    8:01 AM  06/24/2022    8:51 AM 04/03/2022    9:42 AM  Fall Risk   Falls in the past year? 0 0 0 0 1  Number falls in past yr: 0 0 0 0 1  Injury with Fall? 0 0 0 0 0  Risk for fall due to : No Fall Risks No Fall Risks No Fall Risks No Fall Risks History of fall(s)  Follow up Falls prevention discussed;Education provided;Falls evaluation completed Falls prevention discussed Falls prevention discussed Falls prevention discussed Falls prevention discussed;Education provided;Falls evaluation completed     Assessment & Plan  1. Recurrent strokes (HCC) (Primary)  - Semaglutide ,0.25 or 0.5MG /DOS, (OZEMPIC , 0.25 OR 0.5 MG/DOSE,) 2 MG/3ML SOPN; Inject 0.25-0.5 mg into the skin once a week.  Dispense: 3 mL; Refill: 0  2. Hemiparesis of left nondominant side as late effect of cerebral infarction (HCC)  - Semaglutide ,0.25 or 0.5MG /DOS, (OZEMPIC , 0.25 OR 0.5 MG/DOSE,) 2 MG/3ML SOPN; Inject 0.25-0.5 mg into the skin once a week.  Dispense: 3 mL; Refill: 0  3. Dysautonomia (HCC)  Taking midodrine - stable  4. Seizure-like activity (  HCC)  Under the care of Dr. Maree  5. Mild episode of recurrent major depressive disorder (HCC)  - PARoxetine  (PAXIL -CR) 25 MG 24 hr tablet; TAKE 2 TABLET BY MOUTH DAILY  Dispense: 180 tablet; Refill: 1  6. Dyslipidemia associated with type 2 diabetes mellitus (HCC)  - POCT HgB A1C - POCT glucose (manual entry)  7. Reactive airways dysfunction syndrome (HCC)  - fluticasone  furoate-vilanterol (BREO ELLIPTA ) 100-25 MCG/ACT AEPB; Inhale 1 puff into the lungs daily.  Dispense: 180 each; Refill: 1  8. GAD (generalized anxiety disorder)  - ALPRAZolam  (XANAX  XR) 1 MG 24 hr tablet; Take 1 tablet (1 mg total) by mouth every morning.  Dispense: 90 tablet; Refill: 0 - PARoxetine  (PAXIL -CR) 25 MG 24 hr tablet; TAKE 2 TABLET BY MOUTH DAILY  Dispense: 180 tablet; Refill: 1  9. Chronic neck pain  stable  10. Chronic insomnia  - zolpidem  (AMBIEN  CR) 12.5 MG CR tablet; Take 1  tablet (12.5 mg total) by mouth at bedtime as needed for sleep.  Dispense: 90 tablet; Refill: 0  11. Dysuria  - CULTURE, URINE COMPREHENSIVE  12. Lymphadenopathy  - US  SOFT TISSUE HEAD & NECK (NON-THYROID ); Future

## 2023-03-28 ENCOUNTER — Encounter: Payer: Self-pay | Admitting: Family Medicine

## 2023-03-28 LAB — CULTURE, URINE COMPREHENSIVE
MICRO NUMBER:: 16047226
SPECIMEN QUALITY:: ADEQUATE

## 2023-04-04 ENCOUNTER — Ambulatory Visit: Payer: BC Managed Care – PPO

## 2023-04-23 ENCOUNTER — Other Ambulatory Visit: Payer: Self-pay | Admitting: Family Medicine

## 2023-04-23 DIAGNOSIS — I69354 Hemiplegia and hemiparesis following cerebral infarction affecting left non-dominant side: Secondary | ICD-10-CM

## 2023-04-23 DIAGNOSIS — I639 Cerebral infarction, unspecified: Secondary | ICD-10-CM

## 2023-05-05 ENCOUNTER — Other Ambulatory Visit: Payer: Self-pay | Admitting: Family Medicine

## 2023-05-05 DIAGNOSIS — E559 Vitamin D deficiency, unspecified: Secondary | ICD-10-CM

## 2023-05-05 DIAGNOSIS — J683 Other acute and subacute respiratory conditions due to chemicals, gases, fumes and vapors: Secondary | ICD-10-CM

## 2023-05-13 DIAGNOSIS — Z79899 Other long term (current) drug therapy: Secondary | ICD-10-CM | POA: Diagnosis not present

## 2023-05-13 DIAGNOSIS — R404 Transient alteration of awareness: Secondary | ICD-10-CM | POA: Diagnosis not present

## 2023-05-13 DIAGNOSIS — M542 Cervicalgia: Secondary | ICD-10-CM | POA: Diagnosis not present

## 2023-05-13 DIAGNOSIS — R519 Headache, unspecified: Secondary | ICD-10-CM | POA: Diagnosis not present

## 2023-05-15 ENCOUNTER — Encounter: Payer: Self-pay | Admitting: Dermatology

## 2023-05-15 ENCOUNTER — Ambulatory Visit: Admitting: Dermatology

## 2023-05-15 DIAGNOSIS — L709 Acne, unspecified: Secondary | ICD-10-CM | POA: Diagnosis not present

## 2023-05-15 DIAGNOSIS — L700 Acne vulgaris: Secondary | ICD-10-CM

## 2023-05-15 DIAGNOSIS — L710 Perioral dermatitis: Secondary | ICD-10-CM | POA: Diagnosis not present

## 2023-05-15 DIAGNOSIS — L573 Poikiloderma of Civatte: Secondary | ICD-10-CM | POA: Diagnosis not present

## 2023-05-15 MED ORDER — DOXYCYCLINE MONOHYDRATE 100 MG PO CAPS: 100.0000 mg | ORAL_CAPSULE | Freq: Two times a day (BID) | ORAL | 1 refills | Status: AC

## 2023-05-15 MED ORDER — DAPSONE 7.5 % EX GEL: 1.0000 | CUTANEOUS | 11 refills | Status: AC

## 2023-05-15 NOTE — Progress Notes (Signed)
   Follow-Up Visit   Subjective  Alicia [name] is a 53 y.o. female who presents for the following: Rash perioral, ~1wk, Aczone qd flared after using HC 2.5T cr for Seborrheic dermatitis,  hx of Perioral dermatitis in past, rash neck, 41m, no symptoms The patient has spots, moles and lesions to be evaluated, some may be new or changing and the patient may have concern these could be cancer.   The following portions of the chart were reviewed this encounter and updated as appropriate: medications, allergies, medical history  Review of Systems:  No other skin or systemic complaints except as noted in HPI or Assessment and Plan.  Objective  Well appearing patient in no apparent distress; mood and affect are within normal limits.   A focused examination was performed of the following areas: Face, neck  Relevant exam findings are noted in the Assessment and Plan.    Assessment & Plan   PERIORAL DERMATITIS with ACNE, chronic flaring not at goal Exacerbated by topical steroid face Exam: Scaly pink papules on perioral skin  Treatment Plan: Start Doxycyline 100mg  1 po bid with food and drink x 2 months. Take full course if tolerating to reduce risk of recurrence Cont Aczone 7.5% gel qd/bid to face Continue to avoid hydrocortisone on affected area  Doxycycline should be taken with food to prevent nausea. Do not lay down for 30 minutes after taking. Be cautious with sun exposure and use good sun protection while on this medication. Pregnant women should not take this medication.   Long term medication management.  Patient is using long term (months to years) prescription medication  to control their dermatologic condition.  These medications require periodic monitoring to evaluate for efficacy and side effects and may require periodic laboratory monitoring.   POILKILODERMA of CIVATTE, chronic flaring not at goal neck Exam: telangiectasias and dyspigmentation of sun-exposed chest  and lateral neck  Treatment Plan: Benign, observe Discussed laser, recommend Dr. Geraldine Solar Cox, information given to patient PERIORAL DERMATITIS   POIKILODERMA OF CIVATTE    Return in about 1 year (around 05/14/2024) for acne, perioral derm.  I, Ardis Rowan, RMA, am acting as scribe for Elie Goody, MD .   Documentation: I have reviewed the above documentation for accuracy and completeness, and I agree with the above.  Elie Goody, MD

## 2023-05-15 NOTE — Patient Instructions (Addendum)
 Recommend  Normand Sloop, M.D. [address] [place], [place] [address] [phone]  [email or web address]    Due to recent changes in healthcare laws, you may see results of your pathology and/or laboratory studies on MyChart before the doctors have had a chance to review them. We understand that in some cases there may be results that are confusing or concerning to you. Please understand that not all results are received at the same time and often the doctors may need to interpret multiple results in order to provide you with the best plan of care or course of treatment. Therefore, we ask that you please give [place] 2 business days to thoroughly review all your results before contacting the office for clarification. Should we see a critical lab result, you will be contacted sooner.   If You Need Anything After Your Visit  If you have any questions or concerns for your doctor, please call our main line at [phone] and press option 4 to reach your doctor's medical assistant. If no one answers, please leave a voicemail as directed and we will return your call as soon as possible. Messages left after 4 pm will be answered the following business day.   You may also send [place] a message via MyChart. We typically respond to MyChart messages within 1-2 business days.  For prescription refills, please ask your pharmacy to contact our office. Our fax number is [phone].  If you have an urgent issue when the clinic is closed that cannot wait until the next business day, you can page your doctor at the number below.    Please note that while we do our best to be available for urgent issues outside of office hours, we are not available 24/7.   If you have an urgent issue and are unable to reach [place], you may choose to seek medical care at your doctor's office, retail clinic, urgent care center, or emergency room.  If you have a medical emergency, please immediately call 911 or go to  the emergency department.  Pager Numbers  - Dr. Gwen Pounds: [phone]  - Dr. Roseanne Reno: [phone]  - Dr. Katrinka Blazing: [phone]   In the event of inclement weather, please call our main line at [phone] for an update on the status of any delays or closures.  Dermatology Medication Tips: Please keep the boxes that topical medications come in in order to help keep track of the instructions about where and how to use these. Pharmacies typically print the medication instructions only on the boxes and not directly on the medication tubes.   If your medication is too expensive, please contact our office at [phone] option 4 or send [place] a message through MyChart.   We are unable to tell what your co-pay for medications will be in advance as this is different depending on your insurance coverage. However, we may be able to find a substitute medication at lower cost or fill out paperwork to get insurance to cover a needed medication.   If a prior authorization is required to get your medication covered by your insurance company, please allow [place] 1-2 business days to complete this process.  Drug prices often vary depending on where the prescription is filled and some pharmacies may offer cheaper prices.  The website www.goodrx.com contains coupons for medications through different pharmacies. The prices here do not account for what the cost may be with help from insurance (it may be cheaper with your insurance), but the website can give  you the price if you did not use any insurance.  - You can print the associated coupon and take it with your prescription to the pharmacy.  - You may also stop by our office during regular business hours and pick up a GoodRx coupon card.  - If you need your prescription sent electronically to a different pharmacy, notify our office through [facility] or by phone at [phone] option 4.     Si Usted Necesita Algo Despus de Su Visita  Tambin  puede enviarnos un mensaje a travs de Clinical cytogeneticist. Por lo general respondemos a los mensajes de MyChart en el transcurso de 1 a 2 das hbiles.  Para renovar recetas, por favor pida a su farmacia que se ponga en contacto con nuestra oficina. Annie Sable de fax es [place] [phone].  Si tiene un asunto urgente cuando la clnica est cerrada y que no puede esperar hasta el siguiente da hbil, puede llamar/localizar a su doctor(a) al nmero que aparece a continuacin.   Por favor, tenga en cuenta que aunque hacemos todo lo posible para estar disponibles para asuntos urgentes fuera del horario de [place], no estamos disponibles las 24 horas del da, los 7 [address]  [address] de la [place].   Si tiene un problema urgente y no puede comunicarse con nosotros, puede optar por buscar atencin mdica  en el consultorio de su doctor(a), en una clnica privada, en un centro de atencin urgente o en una sala de emergencias.  Si tiene Engineer, drilling, por favor llame inmediatamente al 911 o vaya a la sala de emergencias.  Nmeros de bper  - Dr. Gwen Pounds: [phone]  - Dra. Roseanne Reno: [phone]  - Dr. Katrinka Blazing: [phone]   En caso de inclemencias del tiempo, por favor llame a Lacy Duverney principal al [phone] para una actualizacin sobre el [place] de cualquier retraso o cierre.  Consejos para la medicacin en dermatologa: Por favor, guarde las cajas en las que vienen los medicamentos de uso tpico para ayudarle a seguir las instrucciones sobre dnde y cmo usarlos. Las farmacias generalmente imprimen las instrucciones del medicamento slo en las cajas y no directamente en los tubos del [place].   Si su medicamento es muy caro, por favor, pngase en contacto con Rolm Gala llamando al [phone] y presione la opcin 4 o envenos un mensaje a travs de Clinical cytogeneticist.   No podemos decirle cul ser su copago por los medicamentos por adelantado ya que esto es diferente dependiendo de la cobertura de su  seguro. Sin embargo, es posible que podamos encontrar un medicamento sustituto a Audiological scientist un formulario para que el seguro cubra el medicamento que se considera necesario.   Si se requiere una autorizacin previa para que su compaa de seguros [place] su medicamento, por favor permtanos de 1 a 2 das hbiles para completar [address]  Los precios de los medicamentos varan con frecuencia dependiendo del Environmental consultant de dnde se surte la receta y alguna farmacias pueden ofrecer precios ms baratos.  El sitio web www.goodrx.com tiene cupones para medicamentos de Health and safety inspector. Los precios aqu no tienen en cuenta lo que podra costar con la ayuda del seguro (puede ser ms barato con su seguro), pero el sitio web puede darle el precio si no utiliz Tourist information centre manager.  - Puede imprimir el cupn correspondiente y llevarlo con su receta a la farmacia.  - Tambin puede pasar por nuestra oficina durante el horario de atencin regular y Education officer, museum una tarjeta de cupones de GoodRx.  -  Si necesita que su receta se enve electrnicamente a Psychiatrist, informe a nuestra oficina a travs de MyChart de [address] o por telfono llamando al [phone] y presione la opcin 4.

## 2023-05-22 ENCOUNTER — Other Ambulatory Visit: Payer: Self-pay | Admitting: Neurology

## 2023-05-22 DIAGNOSIS — M542 Cervicalgia: Secondary | ICD-10-CM

## 2023-05-22 DIAGNOSIS — R404 Transient alteration of awareness: Secondary | ICD-10-CM

## 2023-05-25 ENCOUNTER — Ambulatory Visit
Admission: RE | Admit: 2023-05-25 | Discharge: 2023-05-25 | Disposition: A | Discharge status: Home / Self Care | Source: Ambulatory Visit | Attending: Neurology | Admitting: Neurology

## 2023-05-25 DIAGNOSIS — M50323 Other cervical disc degeneration at C6-C7 level: Secondary | ICD-10-CM | POA: Diagnosis not present

## 2023-05-25 DIAGNOSIS — R404 Transient alteration of awareness: Secondary | ICD-10-CM | POA: Diagnosis not present

## 2023-05-25 DIAGNOSIS — M50322 Other cervical disc degeneration at C5-C6 level: Secondary | ICD-10-CM | POA: Diagnosis not present

## 2023-05-25 DIAGNOSIS — M47812 Spondylosis without myelopathy or radiculopathy, cervical region: Secondary | ICD-10-CM | POA: Diagnosis not present

## 2023-05-25 DIAGNOSIS — M542 Cervicalgia: Secondary | ICD-10-CM

## 2023-06-02 ENCOUNTER — Ambulatory Visit: Attending: Family Medicine

## 2023-06-18 ENCOUNTER — Ambulatory Visit: Payer: BC Managed Care – PPO | Admitting: Family Medicine

## 2023-06-18 ENCOUNTER — Encounter: Payer: Self-pay | Admitting: Family Medicine

## 2023-06-18 VITALS — BP 108/66 | HR 92 | Resp 16 | Ht 68.0 in | Wt 203.2 lb

## 2023-06-18 DIAGNOSIS — E1169 Type 2 diabetes mellitus with other specified complication: Secondary | ICD-10-CM | POA: Diagnosis not present

## 2023-06-18 DIAGNOSIS — G901 Familial dysautonomia [Riley-Day]: Secondary | ICD-10-CM | POA: Diagnosis not present

## 2023-06-18 DIAGNOSIS — F5104 Psychophysiologic insomnia: Secondary | ICD-10-CM

## 2023-06-18 DIAGNOSIS — I69354 Hemiplegia and hemiparesis following cerebral infarction affecting left non-dominant side: Secondary | ICD-10-CM

## 2023-06-18 DIAGNOSIS — F411 Generalized anxiety disorder: Secondary | ICD-10-CM

## 2023-06-18 DIAGNOSIS — G8929 Other chronic pain: Secondary | ICD-10-CM

## 2023-06-18 DIAGNOSIS — E538 Deficiency of other specified B group vitamins: Secondary | ICD-10-CM | POA: Diagnosis not present

## 2023-06-18 DIAGNOSIS — R569 Unspecified convulsions: Secondary | ICD-10-CM | POA: Diagnosis not present

## 2023-06-18 DIAGNOSIS — E785 Hyperlipidemia, unspecified: Secondary | ICD-10-CM | POA: Diagnosis not present

## 2023-06-18 DIAGNOSIS — G43009 Migraine without aura, not intractable, without status migrainosus: Secondary | ICD-10-CM

## 2023-06-18 DIAGNOSIS — E559 Vitamin D deficiency, unspecified: Secondary | ICD-10-CM

## 2023-06-18 DIAGNOSIS — Z1231 Encounter for screening mammogram for malignant neoplasm of breast: Secondary | ICD-10-CM

## 2023-06-18 DIAGNOSIS — M542 Cervicalgia: Secondary | ICD-10-CM

## 2023-06-18 LAB — POCT GLYCOSYLATED HEMOGLOBIN (HGB A1C): Hemoglobin A1C: 5.8 % — AB (ref 4.0–5.6)

## 2023-06-18 MED ORDER — ALPRAZOLAM ER 1 MG PO TB24: 1.0000 mg | ORAL_TABLET | ORAL | 0 refills | Status: DC

## 2023-06-18 MED ORDER — PANTOPRAZOLE SODIUM 20 MG PO TBEC: 20.0000 mg | DELAYED_RELEASE_TABLET | Freq: Every day | ORAL | 1 refills | Status: DC

## 2023-06-18 MED ORDER — ZOLPIDEM TARTRATE ER 12.5 MG PO TBCR: 12.5000 mg | EXTENDED_RELEASE_TABLET | Freq: Every evening | ORAL | 0 refills | Status: DC | PRN

## 2023-06-18 MED ORDER — CYCLOBENZAPRINE HCL 10 MG PO TABS
10.0000 mg | ORAL_TABLET | Freq: Every day | ORAL | 1 refills | Status: AC
Start: 2023-06-18 — End: ?

## 2023-06-18 MED ORDER — CLOPIDOGREL BISULFATE 75 MG PO TABS: 75.0000 mg | ORAL_TABLET | Freq: Every day | ORAL | 0 refills | Status: DC

## 2023-06-18 MED ORDER — CYANOCOBALAMIN 1000 MCG/ML IJ SOLN
1000.0000 ug | Freq: Once | INTRAMUSCULAR | Status: AC
  Administered 2023-06-18: 1000 ug via INTRAMUSCULAR

## 2023-06-18 MED ORDER — VITAMIN D (ERGOCALCIFEROL) 1.25 MG (50000 UNIT) PO CAPS: 50000.0000 [IU] | ORAL_CAPSULE | ORAL | 1 refills | Status: DC

## 2023-06-18 MED ORDER — SEMAGLUTIDE (1 MG/DOSE) 4 MG/3ML ~~LOC~~ SOPN: 1.0000 mg | PEN_INJECTOR | SUBCUTANEOUS | 1 refills | Status: DC

## 2023-06-18 NOTE — Progress Notes (Signed)
 Name: Alicia [name] Note Ellers   MRN: [id number]    DOB: 05-16-1970   Date:06/18/2023       Progress Note  Subjective  Chief Complaint  Chief Complaint  Patient presents with   Medical Management of Chronic Issues   Discussed the use of AI scribe software for clinical note transcription with the patient, who gave verbal consent to proceed.  History of Present Illness Alicia [name] is a 53 year old female with non-epileptic spells and diabetes who presents for follow-up of her neurological symptoms and diabetes management.  She experiences ongoing episodes of blackouts, which have been evaluated by a neurologist and are suspected to be non-epileptic spells. These episodes occur when she feels overwhelmed at work, leading to sleepiness and loss of consciousness. An EEG was negative, and she continues on lamotrigine  100 mg daily. Blood work was checked in March by neurologist , including CBC, comprehensive panel, TSH, magnesium , phosphorus, B12, B6, vitamin D , and lamotrigine  levels.  She reports tingling on the left side and increased cervical spine pain. She continues on gabapentin and has had a recent MRI of the cervical spine, which showed arthritis and narrowing at certain levels. She also reports new occipital headaches, described as pressure at the back of her neck, for which she takes Ubrelvy  as needed with good effect.  She has a history of cerebral infarction with left-sided weakness. She underwent physical therapy but did not regain full function. Her hand becomes more uncontrollable with fatigue.  She has diabetes, with a recent A1c of 5.8. She feels very thirsty and urinates frequently. She has reduced her sugar and carbohydrate intake and is currently on Ozempic  0.5 mg, which has helped curb her appetite and contributed to weight loss. Her fasting glucose levels have been elevated in the past, leading to a diagnosis of diabetes.  She has a history of dyslipidemia and is on  statin therapy. She is also on Plavix  following a stroke in 2018. She takes Crestor  10 mg   She experiences generalized anxiety disorder and major depressive disorder, managed with alprazolam  XR 1 mg daily and Paxil  CR 25 mg twice daily. She reports no side effects and finds these medications effective. She also takes Ambien  CR 12.5 mg for sleep, which she finds helpful without side effects.  She has asthma and uses Breo daily, which helps prevent shortness of breath during physical activity. She also has sleep apnea and uses a CPAP machine nightly.  Dysautonomia and POTS, taking midodrine  and sees cardiologist symptoms improved with medication  She has a history of leukocytoclastic vasculitis, which was biopsied and is currently resolved. She follows up with a rheumatologist for this condition.  She takes omeprazole 20 mg over the counter for reflux, which she finds effective. She was previously on famotidine  started in the hospital, we will change to pantoprazole  since omeprazole interacts with plavix    She receives monthly B12 injections due to low-normal B12 levels. Her B12 level was 323, and she plans to continue monthly injections.    Patient Active Problem List   Diagnosis Date Noted   Cryptogenic stroke (HCC) 09/09/2022   PVC's (premature ventricular contractions) 09/09/2022   Dysautonomia (HCC) 04/03/2022   Hemiparesis of left nondominant side as late effect of cerebral infarction (HCC) 04/03/2022   Mild episode of recurrent major depressive disorder (HCC) 04/03/2022   HLA B27 (HLA B27 positive) 04/03/2022   Essential hypertension 08/30/2021   COVID-19 long hauler manifesting chronic fatigue 03/21/2020   Orthostatic hypotension  02/23/2020   Atypical chest pain 02/23/2020   Reactive airways dysfunction syndrome (HCC) 10/15/2019   Hyperfunction of pituitary gland, unspecified (HCC) 10/15/2019   Calculus of gallbladder without cholecystitis without obstruction 05/06/2019   Cyst of  pancreas 05/06/2019   Right kidney stone 05/06/2019   Leukocytoclastic vasculitis (HCC) 10/07/2018   Chronic insomnia 02/09/2018   History of anemia 02/09/2018   Sleep apnea 09/24/2017   History of ischemic stroke 05/30/2017   Hyperlipidemia 05/30/2017   Migraine without aura and without status migrainosus, not intractable 05/27/2017   Pain in right knee 01/21/2017   Psychogenic nonepileptic seizure    Chronic pain syndrome    Restless leg syndrome    Chronic prescription benzodiazepine use 01/20/2016   Leukocytosis 01/20/2016   Seizure-like activity (HCC)    GAD (generalized anxiety disorder) 02/14/2015   Chronic neck pain 02/14/2015   History of hysterectomy 02/14/2015   Iron deficiency anemia due to chronic blood loss 08/29/2014   Insomnia, persistent 08/14/2014   Major depression (HCC) 08/14/2014   Temporomandibular joint sounds on opening and/or closing the jaw 08/14/2014   Degenerative disc disease, lumbar 08/14/2014   Bleeding internal hemorrhoids 08/14/2014   Gastric reflux 08/14/2014   Irritable bowel syndrome with constipation 08/14/2014   Hypertriglyceridemia 08/14/2014   Overweight 08/14/2014   Tinnitus 08/14/2014   Vitamin D  deficiency 08/14/2014   Sinus tachycardia 11/25/2012   DOE (dyspnea on exertion) 11/06/2012    Past Surgical History:  Procedure Laterality Date   ABDOMINOPLASTY  Feb. 2015   CESAREAN SECTION  2011   CHOLECYSTECTOMY N/A 05/31/2019   Procedure: LAPAROSCOPIC CHOLECYSTECTOMY WITH INTRAOPERATIVE CHOLANGIOGRAM;  Surgeon: Marshall Skeeter, MD;  Location: [facility] ORS;  Service: General;  Laterality: N/A;   COLONOSCOPY WITH PROPOFOL  N/A 11/26/2016   Procedure: COLONOSCOPY WITH PROPOFOL ;  Surgeon: Luke Salaam, MD;  Location: [facility] ENDOSCOPY;  Service: Gastroenterology;  Laterality: N/A;   COLONOSCOPY WITH PROPOFOL  N/A 08/08/2020   Procedure: COLONOSCOPY WITH PROPOFOL ;  Surgeon: Shane Darling, MD;  Location: [facility] ENDOSCOPY;  Service: Endoscopy;   Laterality: N/A;   CYSTOSCOPY  02/02/2015   Procedure: CYSTOSCOPY;  Surgeon: Alben Alma, MD;  Location: [facility] ORS;  Service: Gynecology;;   DILATION AND CURETTAGE OF UTERUS  2003, 2005, 2008   ESOPHAGOGASTRODUODENOSCOPY N/A 05/31/2019   Procedure: ESOPHAGOGASTRODUODENOSCOPY (EGD);  Surgeon: Marshall Skeeter, MD;  Location: [facility] ORS;  Service: General;  Laterality: N/A;  in the O.R.   ESOPHAGOGASTRODUODENOSCOPY (EGD) WITH PROPOFOL  N/A 11/26/2016   Procedure: ESOPHAGOGASTRODUODENOSCOPY (EGD) WITH PROPOFOL ;  Surgeon: Luke Salaam, MD;  Location: [facility] ENDOSCOPY;  Service: Gastroenterology;  Laterality: N/A;   HERNIA REPAIR  1999   Umbilical   KNEE ARTHROSCOPY Right 2006   LAPAROSCOPIC BILATERAL SALPINGECTOMY Bilateral 02/02/2015   Procedure: LAPAROSCOPIC BILATERAL SALPINGECTOMY;  Surgeon: Alben Alma, MD;  Location: [facility] ORS;  Service: Gynecology;  Laterality: Bilateral;   LAPAROSCOPIC HYSTERECTOMY N/A 02/02/2015   Procedure: HYSTERECTOMY TOTAL LAPAROSCOPIC;  Surgeon: Alben Alma, MD;  Location: [facility] ORS;  Service: Gynecology;  Laterality: N/A;   LOOP RECORDER INSERTION N/A 12/20/2016   Procedure: LOOP RECORDER INSERTION;  Surgeon: Lei Pump, MD;  Location: [facility];  Service: Cardiovascular;  Laterality: N/A;   REDUCTION MAMMAPLASTY Bilateral December 2015   TEE WITHOUT CARDIOVERSION N/A 12/20/2016   Procedure: TRANSESOPHAGEAL ECHOCARDIOGRAM (TEE);  Surgeon: Liza Riggers, MD;  Location: [facility] ENDOSCOPY;  Service: Cardiovascular;  Laterality: N/A;   TEE WITHOUT CARDIOVERSION N/A 03/29/2020   Procedure: TRANSESOPHAGEAL ECHOCARDIOGRAM (TEE);  Surgeon: Junnie Olives,  Polly Brink, MD;  Location: [facility] ORS;  Service: Cardiovascular;  Laterality: N/A;    Family History  Problem Relation Age of Onset   Hypertension Mother    Hyperlipidemia Mother    Heart Problems Father        hole in heart and lower ventricles reversed   Prostate cancer Maternal Grandfather    Von  Willebrand disease Maternal Uncle     Social History   Tobacco Use   Smoking status: Never   Smokeless tobacco: Never  Substance Use Topics   Alcohol use: No    Alcohol/week: 0.0 standard drinks of alcohol     Current Outpatient Medications:    ALPRAZolam  (XANAX  XR) 1 MG 24 hr tablet, Take 1 tablet (1 mg total) by mouth every morning., Disp: 90 tablet, Rfl: 0   clopidogrel  (PLAVIX ) 75 MG tablet, Take 1 tablet (75 mg total) by mouth daily., Disp: 90 tablet, Rfl: 0   cyclobenzaprine  (FLEXERIL ) 10 MG tablet, Take 1 tablet (10 mg total) by mouth at bedtime., Disp: 90 tablet, Rfl: 1   Dapsone  (ACZONE ) 7.5 % GEL, Apply 1 Application topically as directed. qd to bid for acne and perioral dermatitis, Disp: 60 g, Rfl: 11   doxycycline  (MONODOX ) 100 MG capsule, Take 1 capsule (100 mg total) by mouth 2 (two) times daily. Take with food and drink, Disp: 120 capsule, Rfl: 1   famotidine  (PEPCID ) 20 MG tablet, Take 20 mg by mouth 2 (two) times daily., Disp: , Rfl:    fluticasone  furoate-vilanterol (BREO ELLIPTA ) 100-25 MCG/ACT AEPB, Inhale 1 puff into the lungs daily., Disp: 180 each, Rfl: 1   gabapentin (NEURONTIN) 100 MG capsule, Take 100 mg by mouth 3 (three) times daily., Disp: , Rfl:    lamoTRIgine  (LAMICTAL ) 100 MG tablet, Take 100 mg by mouth 2 (two) times daily., Disp: , Rfl:    midodrine  (PROAMATINE ) 5 MG tablet, TAKE 1 TABLET BY MOUTH THREE TIMES DAILY AT 7 AM, 11 AM, AND 3 PM, Disp: 270 tablet, Rfl: 3   PARoxetine  (PAXIL -CR) 25 MG 24 hr tablet, TAKE 2 TABLET BY MOUTH DAILY, Disp: 180 tablet, Rfl: 1   rosuvastatin  (CRESTOR ) 10 MG tablet, Take 10 mg by mouth at bedtime., Disp: , Rfl:    Semaglutide ,0.25 or 0.5MG /DOS, (OZEMPIC , 0.25 OR 0.5 MG/DOSE,) 2 MG/3ML SOPN, Inject 0.5 mg into the skin once a week., Disp: 3 mL, Rfl: 2   UBRELVY  100 MG TABS, Take 100 mg by mouth daily as needed (migraine). , Disp: , Rfl:    Vitamin D , Ergocalciferol , (DRISDOL ) 1.25 MG (50000 UNIT) CAPS capsule, Take 1  capsule (50,000 Units total) by mouth every 7 (seven) days., Disp: 12 capsule, Rfl: 1   zolpidem  (AMBIEN  CR) 12.5 MG CR tablet, Take 1 tablet (12.5 mg total) by mouth at bedtime as needed for sleep., Disp: 90 tablet, Rfl: 0  Allergies  Allergen Reactions   Azithromycin  Diarrhea   Penicillins Hives and Swelling    Has patient had a PCN reaction causing immediate rash, facial/tongue/throat swelling, SOB or lightheadedness with hypotension: Yes Has patient had a PCN reaction causing severe rash involving mucus membranes or skin necrosis: No Has patient had a PCN reaction that required hospitalization No Has patient had a PCN reaction occurring within the last 10 years: No If all of the above answers are "NO", then may proceed with Cephalosporin use.    I personally reviewed active problem list, medication list, allergies, family history with the patient/caregiver today.   ROS  Ten systems  reviewed and is negative except as mentioned in HPI    Objective Physical Exam  Constitutional: Patient appears well-developed and well-nourished. Obese  No distress.  HEENT: head atraumatic, normocephalic, pupils equal and reactive to light,, neck supple Cardiovascular: Normal rate, regular rhythm and normal heart sounds.  No murmur heard. No BLE edema. Pulmonary/Chest: Effort normal and breath sounds normal. No respiratory distress. Abdominal: Soft.  There is no tenderness. Psychiatric: Patient has a normal mood and affect. behavior is normal. Judgment and thought content normal.   Vitals:   06/18/23 0757  BP: 108/66  Pulse: 92  Resp: 16  SpO2: 94%  Weight: 203 lb 3.2 oz (92.2 kg)  Height: 5\' 8"  (1.727 m)    Body mass index is 30.9 kg/m.  Recent Results (from the past 2160 hours)  POCT glucose (manual entry)     Status: Abnormal   Collection Time: 03/26/23  8:45 AM  Result Value Ref Range   POC Glucose 155 (A) 70 - 99 mg/dl  POCT HgB H8I     Status: Abnormal   Collection Time:  03/26/23  8:53 AM  Result Value Ref Range   Hemoglobin A1C 6.2 (A) 4.0 - 5.6 %   HbA1c POC (<> result, manual entry)     HbA1c, POC (prediabetic range)     HbA1c, POC (controlled diabetic range)    POCT urinalysis dipstick     Status: Abnormal   Collection Time: 03/26/23  9:12 AM  Result Value Ref Range   Color, UA Yellow    Clarity, UA Cloudy    Glucose, UA Negative Negative   Bilirubin, UA Negative    Ketones, UA Negative    Spec Grav, UA 1.020 1.010 - 1.025   Blood, UA Moderate    pH, UA 6.0 5.0 - 8.0   Protein, UA Negative Negative   Urobilinogen, UA 0.2 0.2 or 1.0 E.U./dL   Nitrite, UA Negative    Leukocytes, UA Moderate (2+) (A) Negative   Appearance yellow    Odor foul   CULTURE, URINE COMPREHENSIVE     Status: Abnormal   Collection Time: 03/26/23  9:27 AM   Specimen: Urine  Result Value Ref Range   MICRO NUMBER: [id number]    SPECIMEN QUALITY: Adequate    Source OTHER (SPECIFY)    STATUS: FINAL    ISOLATE 1: Streptococcus agalactiae (A)     Comment: 1,000-9,000 CFU/ML of Group B Streptococcus isolated Beta-hemolytic streptococci are predictably susceptible to Penicillin and other beta-lactams. Susceptibility testing not routinely performed. Please contact the laboratory within 3 days if  susceptibility testing is desired. Erythromycin and clindamycin  are not recommended for treatment of urinary tract infections, but clindamycin  may be useful for treatment of rectovaginal colonization or infection. Any amount of group B Streptococcus in  urine specimens obtained from pregnant females is a marker of genital tract colonization. If this patient is pregnant, please refer to ACOG guidelines for appropriate screening and management of pregnant women.   POCT glycosylated hemoglobin (Hb A1C)     Status: Abnormal   Collection Time: 06/18/23  8:02 AM  Result Value Ref Range   Hemoglobin A1C 5.8 (A) 4.0 - 5.6 %   HbA1c POC (<> result, manual entry)     HbA1c, POC (prediabetic  range)     HbA1c, POC (controlled diabetic range)       PHQ2/9:    06/18/2023    7:56 AM 03/26/2023    8:23 AM 12/24/2022    7:45 AM 10/02/2022  8:02 AM 06/24/2022    8:52 AM  Depression screen PHQ 2/9  Decreased Interest 0 0 0 1 0  Down, Depressed, Hopeless 0 0 0 0 0  PHQ - 2 Score 0 0 0 1 0  Altered sleeping 0 0  1 0  Tired, decreased energy 0 0  1 1  Change in appetite 0 0  0 0  Feeling bad or failure about yourself  0 0  0 0  Trouble concentrating 0 0  0 1  Moving slowly or fidgety/restless 0 0  0 0  Suicidal thoughts 0 0  0 0  PHQ-9 Score 0 0  3 2  Difficult doing work/chores Not difficult at all Not difficult at all       phq 9 is negative     06/18/2023    7:57 AM 03/26/2023    8:23 AM 04/03/2022    9:46 AM 11/30/2021    8:19 AM  GAD 7 : Generalized Anxiety Score  Nervous, Anxious, on Edge 0 0 1 0  Control/stop worrying 0 0 1 0  Worry too much - different things 0 0 1 0  Trouble relaxing 0 0 0 0  Restless 0 0 0 0  Easily annoyed or irritable 0 0 0 0  Afraid - awful might happen 0 0 0 0  Total GAD 7 Score 0 0 3 0  Anxiety Difficulty Not difficult at all Not difficult at all Somewhat difficult      Fall Risk:    03/26/2023    8:20 AM 12/24/2022    7:45 AM 10/02/2022    8:01 AM 06/24/2022    8:51 AM 04/03/2022    9:42 AM  Fall Risk   Falls in the past year? 0 0 0 0 1  Number falls in past yr: 0 0 0 0 1  Injury with Fall? 0 0 0 0 0  Risk for fall due to : No Fall Risks No Fall Risks No Fall Risks No Fall Risks History of fall(s)  Follow up Falls prevention discussed;Education provided;Falls evaluation completed Falls prevention discussed Falls prevention discussed Falls prevention discussed Falls prevention discussed;Education provided;Falls evaluation completed      Assessment & Plan Diabetes mellitus with dyslipidemia, obesity Diabetes well-controlled with A1c of 5.8. Increased thirst and urination noted. Current treatment with Ozempic  effective for symptom  control and weight loss. - Increase dose to 1 mg of Ozempic  since no hypoglycemic episodes  - Check lipid panel and protein in urine in August.   Obesity Obesity managed with lifestyle modifications and medication. 13-pound weight loss since February indicates progress. - Continue current lifestyle modifications and Ozempic  therapy.  Dyslipidemia Dyslipidemia management ongoing. - Check lipid panel in August.  Gastroesophageal reflux disease (GERD) GERD managed with omeprazole, but interaction with Plavix  noted. Discussed switching to pantoprazole . - Discontinue omeprazole. - Prescribe pantoprazole  for GERD management.  Moderate persistent Asthma  Shortness of breath reported if Breo not taken. - Continue Breo for reactive airway disease.  Sleep apnea Sleep apnea managed with CPAP therapy used nightly. - Continue CPAP therapy.  Cervical spine pain Cervical spine pain worsening. MRI shows arthritis and narrowing. Pain persists despite current management. - Continue gabapentin for pain management. - Continue Flexeril  for chronic pain management. - keep follow up with neurologist and physiatrist   Cerebral infarction with hemiparesis Cerebral infarction with residual left-sided weakness. Increased weakness with fatigue reported. MRI shows old infarct, no new changes. - Continue Plavix   - Continue statin therapy  with Crestor  10 mg.  Generalized anxiety disorder Generalized anxiety disorder well-controlled with alprazolam  XR and Paxil  CR. - Continue alprazolam  XR 1 mg daily. - Continue Paxil  CR 25 mg twice daily.  Depression Depression well-controlled with current medication regimen. - Continue current antidepressant regimen.  Dysautonomia -continue midodrine    General Health Maintenance Discussed importance of pneumococcal and shingles vaccines due to risk factors such as asthma and diabetes. - Consider pneumococcal and shingles vaccines.

## 2023-07-21 ENCOUNTER — Other Ambulatory Visit: Payer: Self-pay | Admitting: Family Medicine

## 2023-07-21 ENCOUNTER — Encounter: Payer: Self-pay | Admitting: Family Medicine

## 2023-07-21 MED ORDER — GABAPENTIN 100 MG PO CAPS: 100.0000 mg | ORAL_CAPSULE | Freq: Three times a day (TID) | ORAL | 1 refills | Status: DC

## 2023-08-11 ENCOUNTER — Encounter: Payer: Self-pay | Admitting: Family Medicine

## 2023-08-11 ENCOUNTER — Other Ambulatory Visit: Payer: Self-pay

## 2023-08-12 ENCOUNTER — Other Ambulatory Visit: Payer: Self-pay

## 2023-08-12 MED ORDER — SEMAGLUTIDE (1 MG/DOSE) 4 MG/3ML ~~LOC~~ SOPN: 1.0000 mg | PEN_INJECTOR | SUBCUTANEOUS | 0 refills | Status: DC

## 2023-09-10 ENCOUNTER — Ambulatory Visit: Admitting: Family Medicine

## 2023-09-10 ENCOUNTER — Encounter: Payer: Self-pay | Admitting: Family Medicine

## 2023-09-10 VITALS — BP 106/66 | HR 99 | Resp 16 | Ht 68.0 in | Wt 186.4 lb

## 2023-09-10 DIAGNOSIS — R300 Dysuria: Secondary | ICD-10-CM | POA: Diagnosis not present

## 2023-09-10 DIAGNOSIS — F5104 Psychophysiologic insomnia: Secondary | ICD-10-CM

## 2023-09-10 DIAGNOSIS — E538 Deficiency of other specified B group vitamins: Secondary | ICD-10-CM

## 2023-09-10 DIAGNOSIS — I69354 Hemiplegia and hemiparesis following cerebral infarction affecting left non-dominant side: Secondary | ICD-10-CM

## 2023-09-10 DIAGNOSIS — E785 Hyperlipidemia, unspecified: Secondary | ICD-10-CM | POA: Diagnosis not present

## 2023-09-10 DIAGNOSIS — G901 Familial dysautonomia [Riley-Day]: Secondary | ICD-10-CM | POA: Diagnosis not present

## 2023-09-10 DIAGNOSIS — M25561 Pain in right knee: Secondary | ICD-10-CM

## 2023-09-10 DIAGNOSIS — E1169 Type 2 diabetes mellitus with other specified complication: Secondary | ICD-10-CM

## 2023-09-10 DIAGNOSIS — F411 Generalized anxiety disorder: Secondary | ICD-10-CM

## 2023-09-10 DIAGNOSIS — G43009 Migraine without aura, not intractable, without status migrainosus: Secondary | ICD-10-CM

## 2023-09-10 DIAGNOSIS — I639 Cerebral infarction, unspecified: Secondary | ICD-10-CM

## 2023-09-10 DIAGNOSIS — M791 Myalgia, unspecified site: Secondary | ICD-10-CM

## 2023-09-10 DIAGNOSIS — F330 Major depressive disorder, recurrent, mild: Secondary | ICD-10-CM

## 2023-09-10 DIAGNOSIS — R569 Unspecified convulsions: Secondary | ICD-10-CM

## 2023-09-10 DIAGNOSIS — G8929 Other chronic pain: Secondary | ICD-10-CM

## 2023-09-10 DIAGNOSIS — E559 Vitamin D deficiency, unspecified: Secondary | ICD-10-CM

## 2023-09-10 LAB — POCT GLYCOSYLATED HEMOGLOBIN (HGB A1C): Hemoglobin A1C: 5.4 % (ref 4.0–5.6)

## 2023-09-10 MED ORDER — ZOLPIDEM TARTRATE ER 12.5 MG PO TBCR: 12.5000 mg | EXTENDED_RELEASE_TABLET | Freq: Every evening | ORAL | 0 refills | Status: DC | PRN

## 2023-09-10 MED ORDER — SEMAGLUTIDE (1 MG/DOSE) 4 MG/3ML ~~LOC~~ SOPN: 1.0000 mg | PEN_INJECTOR | SUBCUTANEOUS | 0 refills | Status: DC

## 2023-09-10 MED ORDER — VITAMIN D3 50 MCG (2000 UT) PO CAPS: 2000.0000 [IU] | ORAL_CAPSULE | Freq: Every day | ORAL | 0 refills | Status: DC

## 2023-09-10 MED ORDER — CLOPIDOGREL BISULFATE 75 MG PO TABS: 75.0000 mg | ORAL_TABLET | Freq: Every day | ORAL | 1 refills | Status: DC

## 2023-09-10 MED ORDER — ROSUVASTATIN CALCIUM 10 MG PO TABS: 10.0000 mg | ORAL_TABLET | Freq: Every day | ORAL | 1 refills | Status: DC

## 2023-09-10 MED ORDER — PAROXETINE HCL ER 25 MG PO TB24: ORAL_TABLET | ORAL | 1 refills | Status: DC

## 2023-09-10 MED ORDER — ALPRAZOLAM ER 1 MG PO TB24: 1.0000 mg | ORAL_TABLET | ORAL | 0 refills | Status: DC

## 2023-09-10 NOTE — Progress Notes (Signed)
 Name: Alicia [name] Note Leckey   MRN: [id number]    DOB: 1971-01-08   Date:09/10/2023       Progress Note  Subjective  Chief Complaint  Chief Complaint  Patient presents with   Medical Management of Chronic Issues   Discussed the use of AI scribe software for clinical note transcription with the patient, who gave verbal consent to proceed.  History of Present Illness Alicia [name] is a 53 year old female with type 2 diabetes, dyslipidemia, and recurrent strokes who presents for a three-month follow-up.  She has type 2 diabetes managed with Ozempic  1 mg, resulting in weight loss from 203 lbs to 186.4 lbs since April 30th. She feels less short of breath and overall better, though she still experiences fatigue. Her blood sugar remains above 100 without hypoglycemic episodes, checked every couple of days.  Her dyslipidemia is managed with rosuvastatin  10 mg. She experiences muscle aches mainly in her arms, which she finds difficult to distinguish from nerve pain due to spinal issues. Her last lipid panel was in August of the previous year, and she is due for a new one. She is not on additional medication for triglycerides.  She has a history of recurrent strokes and seizure-like activity, with left-sided hemiparesis as a late effect of an infarct. She has an upcoming appointment with her neurologist in August. No recent strokes. She is on Plavix .  She experiences cervical spine pain with tingling, worse on the left side, and is taking gabapentin . An MRI showed multi-level facet osteoarthritis and degenerative disc disease. She has had lower back injections in the past.  She reports new occipital headaches and has a history of migraines, for which she takes Ubrelvy . She also has dysautonomia and POTS and is taking midodrine . She experiences low blood pressure episodes, leading to exhaustion and occasional fainting spells.  She has a B12 deficiency and receives monthly injections, although  she sometimes misses appointments. Her last B12 level was 371. She also takes prescription vitamin D  weekly.  She has a history of duodenal erosion and gastritis, with a previous endoscopy showing no ulcers. She experiences recurrent UTIs with symptoms of strong-smelling urine and pain, which have been treated multiple times but recur.  She has a history of insomnia and mild recurrent major depressive disorder, managed with Paxil . Physical activity helps her sleep better. She also has chronic knee pain from a meniscus tear and arthritis, managed with heat, Advil , and occasionally ice.    Patient Active Problem List   Diagnosis Date Noted   Cryptogenic stroke (HCC) 09/09/2022   PVC's (premature ventricular contractions) 09/09/2022   Dysautonomia (HCC) 04/03/2022   Hemiparesis of left nondominant side as late effect of cerebral infarction (HCC) 04/03/2022   Mild episode of recurrent major depressive disorder (HCC) 04/03/2022   HLA B27 (HLA B27 positive) 04/03/2022   Essential hypertension 08/30/2021   COVID-19 long hauler manifesting chronic fatigue 03/21/2020   Orthostatic hypotension 02/23/2020   Atypical chest pain 02/23/2020   Reactive airways dysfunction syndrome (HCC) 10/15/2019   Hyperfunction of pituitary gland, unspecified (HCC) 10/15/2019   Calculus of gallbladder without cholecystitis without obstruction 05/06/2019   Cyst of pancreas 05/06/2019   Right kidney stone 05/06/2019   Leukocytoclastic vasculitis (HCC) 10/07/2018   Chronic insomnia 02/09/2018   History of anemia 02/09/2018   Sleep apnea 09/24/2017   History of ischemic stroke 05/30/2017   Hyperlipidemia 05/30/2017   Migraine without aura and without status migrainosus, not intractable 05/27/2017   Pain  in right knee 01/21/2017   Psychogenic nonepileptic seizure    Chronic pain syndrome    Restless leg syndrome    Chronic prescription benzodiazepine use 01/20/2016   Leukocytosis 01/20/2016   Seizure-like activity  (HCC)    GAD (generalized anxiety disorder) 02/14/2015   Chronic neck pain 02/14/2015   History of hysterectomy 02/14/2015   Iron deficiency anemia due to chronic blood loss 08/29/2014   Insomnia, persistent 08/14/2014   Major depression (HCC) 08/14/2014   Temporomandibular joint sounds on opening and/or closing the jaw 08/14/2014   Degenerative disc disease, lumbar 08/14/2014   Bleeding internal hemorrhoids 08/14/2014   Gastric reflux 08/14/2014   Irritable bowel syndrome with constipation 08/14/2014   Hypertriglyceridemia 08/14/2014   Overweight 08/14/2014   Tinnitus 08/14/2014   Vitamin D  deficiency 08/14/2014   Sinus tachycardia 11/25/2012   DOE (dyspnea on exertion) 11/06/2012    Past Surgical History:  Procedure Laterality Date   ABDOMINOPLASTY  Feb. 2015   CESAREAN SECTION  2011   CHOLECYSTECTOMY N/A 05/31/2019   Procedure: LAPAROSCOPIC CHOLECYSTECTOMY WITH INTRAOPERATIVE CHOLANGIOGRAM;  Surgeon: Dessa Reyes ORN, MD;  Location: [facility] ORS;  Service: General;  Laterality: N/A;   COLONOSCOPY WITH PROPOFOL  N/A 11/26/2016   Procedure: COLONOSCOPY WITH PROPOFOL ;  Surgeon: Therisa Bi, MD;  Location: [facility]COPY;  Service: Gastroenterology;  Laterality: N/A;   COLONOSCOPY WITH PROPOFOL  N/A 08/08/2020   Procedure: COLONOSCOPY WITH PROPOFOL ;  Surgeon: Maryruth Ole DASEN, MD;  Location: [facility] ENDOSCOPY;  Service: Endoscopy;  Laterality: N/A;   CYSTOSCOPY  02/02/2015   Procedure: CYSTOSCOPY;  Surgeon: Lamar SHAUNNA Lesches, MD;  Location: [facility] ORS;  Service: Gynecology;;   DILATION AND CURETTAGE OF UTERUS  2003, 2005, 2008   ESOPHAGOGASTRODUODENOSCOPY N/A 05/31/2019   Procedure: ESOPHAGOGASTRODUODENOSCOPY (EGD);  Surgeon: Dessa Reyes ORN, MD;  Location: [facility] ORS;  Service: General;  Laterality: N/A;  in the O.R.   ESOPHAGOGASTRODUODENOSCOPY (EGD) WITH PROPOFOL  N/A 11/26/2016   Procedure: ESOPHAGOGASTRODUODENOSCOPY (EGD) WITH PROPOFOL ;  Surgeon: Therisa Bi, MD;  Location: [facility] ENDOSCOPY;   Service: Gastroenterology;  Laterality: N/A;   HERNIA REPAIR  1999   Umbilical   KNEE ARTHROSCOPY Right 2006   LAPAROSCOPIC BILATERAL SALPINGECTOMY Bilateral 02/02/2015   Procedure: LAPAROSCOPIC BILATERAL SALPINGECTOMY;  Surgeon: Lamar SHAUNNA Lesches, MD;  Location: [facility] ORS;  Service: Gynecology;  Laterality: Bilateral;   LAPAROSCOPIC HYSTERECTOMY N/A 02/02/2015   Procedure: HYSTERECTOMY TOTAL LAPAROSCOPIC;  Surgeon: Lamar SHAUNNA Lesches, MD;  Location: [facility] ORS;  Service: Gynecology;  Laterality: N/A;   LOOP RECORDER INSERTION N/A 12/20/2016   Procedure: LOOP RECORDER INSERTION;  Surgeon: Inocencio Soyla Lunger, MD;  Location: [facility];  Service: Cardiovascular;  Laterality: N/A;   REDUCTION MAMMAPLASTY Bilateral December 2015   TEE WITHOUT CARDIOVERSION N/A 12/20/2016   Procedure: TRANSESOPHAGEAL ECHOCARDIOGRAM (TEE);  Surgeon: Maranda Leim DEL, MD;  Location: [facility] ENDOSCOPY;  Service: Cardiovascular;  Laterality: N/A;   TEE WITHOUT CARDIOVERSION N/A 03/29/2020   Procedure: TRANSESOPHAGEAL ECHOCARDIOGRAM (TEE);  Surgeon: Darliss Rogue, MD;  Location: [facility] ORS;  Service: Cardiovascular;  Laterality: N/A;    Family History  Problem Relation Age of Onset   Hypertension Mother    Hyperlipidemia Mother    Heart Problems Father        hole in heart and lower ventricles reversed   Prostate cancer Maternal Grandfather    Von Willebrand disease Maternal Uncle     Social History   Tobacco Use   Smoking status: Never   Smokeless tobacco: Never  Substance Use Topics   Alcohol use:  No    Alcohol/week: 0.0 standard drinks of alcohol     Current Outpatient Medications:    ALPRAZolam  (XANAX  XR) 1 MG 24 hr tablet, Take 1 tablet (1 mg total) by mouth every morning., Disp: 90 tablet, Rfl: 0   clopidogrel  (PLAVIX ) 75 MG tablet, Take 1 tablet (75 mg total) by mouth daily., Disp: 90 tablet, Rfl: 0   cyclobenzaprine  (FLEXERIL ) 10 MG tablet, Take 1 tablet (10 mg total) by mouth at bedtime., Disp:  90 tablet, Rfl: 1   Dapsone  (ACZONE ) 7.5 % GEL, Apply 1 Application topically as directed. qd to bid for acne and perioral dermatitis, Disp: 60 g, Rfl: 11   fluticasone  furoate-vilanterol (BREO ELLIPTA ) 100-25 MCG/ACT AEPB, Inhale 1 puff into the lungs daily., Disp: 180 each, Rfl: 1   gabapentin  (NEURONTIN ) 100 MG capsule, Take 1 capsule (100 mg total) by mouth 3 (three) times daily., Disp: 270 capsule, Rfl: 1   lamoTRIgine  (LAMICTAL ) 100 MG tablet, Take 100 mg by mouth 2 (two) times daily., Disp: , Rfl:    midodrine  (PROAMATINE ) 5 MG tablet, TAKE 1 TABLET BY MOUTH THREE TIMES DAILY AT 7 AM, 11 AM, AND 3 PM, Disp: 270 tablet, Rfl: 3   pantoprazole  (PROTONIX ) 20 MG tablet, Take 1 tablet (20 mg total) by mouth daily., Disp: 90 tablet, Rfl: 1   PARoxetine  (PAXIL -CR) 25 MG 24 hr tablet, TAKE 2 TABLET BY MOUTH DAILY, Disp: 180 tablet, Rfl: 1   rosuvastatin  (CRESTOR ) 10 MG tablet, Take 10 mg by mouth at bedtime., Disp: , Rfl:    Semaglutide , 1 MG/DOSE, 4 MG/3ML SOPN, Inject 1 mg as directed once a week., Disp: 9 mL, Rfl: 0   UBRELVY  100 MG TABS, Take 100 mg by mouth daily as needed (migraine). , Disp: , Rfl:    Vitamin D , Ergocalciferol , (DRISDOL ) 1.25 MG (50000 UNIT) CAPS capsule, Take 1 capsule (50,000 Units total) by mouth every 7 (seven) days., Disp: 12 capsule, Rfl: 1   zolpidem  (AMBIEN  CR) 12.5 MG CR tablet, Take 1 tablet (12.5 mg total) by mouth at bedtime as needed for sleep., Disp: 90 tablet, Rfl: 0  Allergies  Allergen Reactions   Azithromycin  Diarrhea   Penicillins Hives and Swelling    Has patient had a PCN reaction causing immediate rash, facial/tongue/throat swelling, SOB or lightheadedness with hypotension: Yes Has patient had a PCN reaction causing severe rash involving mucus membranes or skin necrosis: No Has patient had a PCN reaction that required hospitalization No Has patient had a PCN reaction occurring within the last 10 years: No If all of the above answers are NO, then may  proceed with Cephalosporin use.    I personally reviewed active problem list, medication list, allergies with the patient/caregiver today.   ROS  Ten systems reviewed and is negative except as mentioned in HPI    Objective Physical Exam  CONSTITUTIONAL: Patient appears well-developed and well-nourished. No distress. HEENT: Head atraumatic, normocephalic, neck supple. CARDIOVASCULAR: Normal rate, regular rhythm and normal heart sounds. No murmur heard. No BLE edema. PULMONARY: Effort normal and breath sounds normal. No respiratory distress. ABDOMINAL: There is no tenderness or distention. No suprapubic tenderness. MUSCULOSKELETAL: Normal gait. Without gross motor or sensory deficit. PSYCHIATRIC: Patient has a normal mood and affect. Behavior is normal. Judgment and thought content normal. NEUROLOGICAL: Sensation intact bilaterally on feet.  Vitals:   09/10/23 0840  BP: 106/66  Pulse: 99  Resp: 16  SpO2: 96%  Weight: 186 lb 6.4 oz (84.6 kg)  Height: 5' 8 (  1.727 m)    Body mass index is 28.34 kg/m.  Recent Results (from the past 2160 hours)  POCT glycosylated hemoglobin (Hb A1C)     Status: Abnormal   Collection Time: 06/18/23  8:02 AM  Result Value Ref Range   Hemoglobin A1C 5.8 (A) 4.0 - 5.6 %   HbA1c POC (<> result, manual entry)     HbA1c, POC (prediabetic range)     HbA1c, POC (controlled diabetic range)    POCT glycosylated hemoglobin (Hb A1C)     Status: None   Collection Time: 09/10/23  8:44 AM  Result Value Ref Range   Hemoglobin A1C 5.4 4.0 - 5.6 %   HbA1c POC (<> result, manual entry)     HbA1c, POC (prediabetic range)     HbA1c, POC (controlled diabetic range)      Diabetic Foot Exam:  Diabetic foot exam was performed with the following findings:   No deformities, ulcerations, or other skin breakdown Normal sensation of 10g monofilament Intact posterior tibialis and dorsalis pedis pulses      PHQ2/9:    09/10/2023    8:32 AM 06/18/2023    7:56  AM 03/26/2023    8:23 AM 12/24/2022    7:45 AM 10/02/2022    8:02 AM  Depression screen PHQ 2/9  Decreased Interest 0 0 0 0 1  Down, Depressed, Hopeless 0 0 0 0 0  PHQ - 2 Score 0 0 0 0 1  Altered sleeping  0 0  1  Tired, decreased energy  0 0  1  Change in appetite  0 0  0  Feeling bad or failure about yourself   0 0  0  Trouble concentrating  0 0  0  Moving slowly or fidgety/restless  0 0  0  Suicidal thoughts  0 0  0  PHQ-9 Score  0 0  3  Difficult doing work/chores  Not difficult at all Not difficult at all      phq 9 is negative  Fall Risk:    09/10/2023    8:32 AM 03/26/2023    8:20 AM 12/24/2022    7:45 AM 10/02/2022    8:01 AM 06/24/2022    8:51 AM  Fall Risk   Falls in the past year? 0 0 0 0 0  Number falls in past yr: 0 0 0 0 0  Injury with Fall? 0 0 0 0 0  Risk for fall due to : No Fall Risks No Fall Risks No Fall Risks No Fall Risks No Fall Risks  Follow up Falls evaluation completed Falls prevention discussed;Education provided;Falls evaluation completed Falls prevention discussed Falls prevention discussed Falls prevention discussed     Assessment & Plan Recurrent cerebrovascular accidents with left hemiparesis and seizure-like episodes Recurrent strokes with left hemiparesis and seizure-like episodes. No new strokes reported. Seizure-like activity may be related to stress and fatigue. Recent MRI of the spine showed worsening condition. - Continue Plavix . - Follow up with neurologist in August.  Cervical spondylosis with radiculopathy and anterolisthesis Cervical spondylosis with multi-level facet osteoarthritis and anterolisthesis. Symptoms include cervical spine pain and tingling, worse on the left side. MRI shows degenerative disc disease from C5 to C7. - Continue gabapentin . - Discuss potential for spinal injections with neurologist in August.  Migraine and occipital headaches Migraines managed with Ubrelvy , which is effective. Occipital headaches may be  related to cervical spine issues. - Continue Ubrelvy  for migraines.  Postural orthostatic tachycardia syndrome (POTS) and dysautonomia POTS  and dysautonomia with symptoms of hypotension and fatigue. Episodes of hypotension cause exhaustion and fainting spells. - Continue midodrine . - Advise against standing up during hypotensive episodes; recommend sitting or lying down instead.  Type 2 diabetes mellitus with dyslipidemia Type 2 diabetes is well-controlled with an A1c of 5.4. Significant weight loss with Ozempic  1 mg. No hypoglycemic episodes reported. Dyslipidemia management includes rosuvastatin  10 mg, but muscle aches reported, primarily in the arms, which may be musculoskeletal rather than statin-related. Lipid panel is due as the last one was in August of last year. Triglycerides were previously high, and HDL was low. - Continue Ozempic  1 mg. - Provide 31-month supply of Ozempic . - Order lipid panel. - Consider fish oil prescription (Vascepa  or Lovaza) if triglycerides remain high. - Check CK level if muscle aches persist.  Chronic right knee pain with meniscal tear and osteoarthritis Chronic right knee pain due to meniscal tear and osteoarthritis. Pain managed with heat, Advil , and topical treatments. - Use topical Voltaren  and Tylenol  for pain management. - Consider ice application to reduce inflammation.  Chronic low back pain with lumbar spondylosis Chronic low back pain with lumbar spondylosis. - Continue current pain management strategies.  Vitamin B12 deficiency Vitamin B12 deficiency with a history of low levels. She receives monthly B12 injections but sometimes misses appointments. - Check B12 level. - Administer B12 injection after blood work.  Vitamin D  deficiency  Vitamin D  level is at the high end of normal (73) due to supplementation. She is on prescription vitamin D  once a week. - Switch to over-the-counter vitamin D  2000 IU daily. - Recheck vitamin D  level after  switching to over-the-counter supplement.  Gastritis and duodenal erosion History of gastritis and duodenal erosion.  Recurrent urinary tract infections Recurrent UTIs with symptoms of strong-smelling urine and urgency. Previous cultures showed low bacterial count. - Order urine culture. - Consider Premarin cream if culture is negative.  Insomnia Insomnia managed with Ambien . Reports better sleep with physical activity. - Continue Ambien . - Encourage physical activity to improve sleep.  Major depressive disorder, recurrent, mild Mild recurrent major depressive disorder managed with Paxil . - Continue Paxil .  Asthma (reactive airway disease) Asthma managed with Breo. Reports reactive airway disease rather than asthma. - Continue Breo.  General Health Maintenance Routine health maintenance discussed, including mammogram and foot exam. Hepatitis B titer was considered but deferred. - Schedule mammogram. - Perform foot exam. - Consider hepatitis B titer in the future.

## 2023-09-10 NOTE — Patient Instructions (Signed)
 Central Scheduling Number [id number]

## 2023-09-11 ENCOUNTER — Ambulatory Visit

## 2023-09-11 ENCOUNTER — Encounter: Payer: Self-pay | Admitting: Family Medicine

## 2023-09-11 DIAGNOSIS — E538 Deficiency of other specified B group vitamins: Secondary | ICD-10-CM

## 2023-09-11 MED ORDER — CYANOCOBALAMIN 1000 MCG/ML IJ SOLN
1000.0000 ug | Freq: Once | INTRAMUSCULAR | Status: AC
  Administered 2023-09-11: 1000 ug via INTRAMUSCULAR

## 2023-09-12 ENCOUNTER — Other Ambulatory Visit: Payer: Self-pay | Admitting: Family Medicine

## 2023-09-12 LAB — CBC WITH DIFFERENTIAL/PLATELET
Absolute Lymphocytes: 1630 {cells}/uL (ref 850–3900)
Absolute Monocytes: 420 {cells}/uL (ref 200–950)
Basophils Absolute: 42 {cells}/uL (ref 0–200)
Basophils Relative: 0.5 %
Eosinophils Absolute: 126 {cells}/uL (ref 15–500)
Eosinophils Relative: 1.5 %
HCT: 41.6 % (ref 35.0–45.0)
Hemoglobin: 14.2 g/dL (ref 11.7–15.5)
MCH: 32.8 pg (ref 27.0–33.0)
MCHC: 34.1 g/dL (ref 32.0–36.0)
MCV: 96.1 fL (ref 80.0–100.0)
MPV: 10.1 fL (ref 7.5–12.5)
Monocytes Relative: 5 %
Neutro Abs: 6182 {cells}/uL (ref 1500–7800)
Neutrophils Relative %: 73.6 %
Platelets: 264 Thousand/uL (ref 140–400)
RBC: 4.33 Million/uL (ref 3.80–5.10)
RDW: 12.6 % (ref 11.0–15.0)
Total Lymphocyte: 19.4 %
WBC: 8.4 Thousand/uL (ref 3.8–10.8)

## 2023-09-12 LAB — COMPREHENSIVE METABOLIC PANEL WITH GFR
AG Ratio: 2.4 (calc) (ref 1.0–2.5)
ALT: 14 U/L (ref 6–29)
AST: 13 U/L (ref 10–35)
Albumin: 4.6 g/dL (ref 3.6–5.1)
Alkaline phosphatase (APISO): 67 U/L (ref 37–153)
BUN/Creatinine Ratio: 22 (calc) (ref 6–22)
BUN: 24 mg/dL (ref 7–25)
CO2: 30 mmol/L (ref 20–32)
Calcium: 9.7 mg/dL (ref 8.6–10.4)
Chloride: 101 mmol/L (ref 98–110)
Creat: 1.08 mg/dL — ABNORMAL HIGH (ref 0.50–1.03)
Globulin: 1.9 g/dL (ref 1.9–3.7)
Glucose, Bld: 91 mg/dL (ref 65–99)
Potassium: 4.5 mmol/L (ref 3.5–5.3)
Sodium: 139 mmol/L (ref 135–146)
Total Bilirubin: 0.6 mg/dL (ref 0.2–1.2)
Total Protein: 6.5 g/dL (ref 6.1–8.1)
eGFR: 62 mL/min/1.73m2 (ref >=60)

## 2023-09-12 LAB — LIPID PANEL
Cholesterol: 242 mg/dL — ABNORMAL HIGH (ref <200)
HDL: 38 mg/dL — ABNORMAL LOW (ref >=50)
LDL Cholesterol (Calc): 144 mg/dL — ABNORMAL HIGH
Non-HDL Cholesterol (Calc): 204 mg/dL — ABNORMAL HIGH (ref <130)
Total CHOL/HDL Ratio: 6.4 (calc) — ABNORMAL HIGH (ref <5.0)
Triglycerides: 389 mg/dL — ABNORMAL HIGH (ref <150)

## 2023-09-12 LAB — B12 AND FOLATE PANEL
Folate: 6 ng/mL
Vitamin B-12: 344 pg/mL (ref 200–1100)

## 2023-09-12 LAB — CULTURE, URINE COMPREHENSIVE
MICRO NUMBER:: 16737308
SPECIMEN QUALITY:: ADEQUATE

## 2023-09-12 LAB — MICROALBUMIN / CREATININE URINE RATIO
Creatinine, Urine: 365 mg/dL — ABNORMAL HIGH (ref 20–275)
Microalb Creat Ratio: 6 mg/g{creat} (ref <30)
Microalb, Ur: 2.1 mg/dL

## 2023-09-12 LAB — CK: Total CK: 31 U/L (ref 21–240)

## 2023-09-12 MED ORDER — NITROFURANTOIN MONOHYD MACRO 100 MG PO CAPS: 100.0000 mg | ORAL_CAPSULE | Freq: Two times a day (BID) | ORAL | 0 refills | Status: DC

## 2023-09-15 ENCOUNTER — Ambulatory Visit: Payer: Self-pay | Admitting: Family Medicine

## 2023-11-06 ENCOUNTER — Other Ambulatory Visit: Payer: Self-pay | Admitting: Family Medicine

## 2023-11-06 ENCOUNTER — Other Ambulatory Visit: Payer: Self-pay

## 2023-11-06 ENCOUNTER — Encounter: Payer: Self-pay | Admitting: Family Medicine

## 2023-11-06 DIAGNOSIS — N644 Mastodynia: Secondary | ICD-10-CM

## 2023-11-06 DIAGNOSIS — N6320 Unspecified lump in the left breast, unspecified quadrant: Secondary | ICD-10-CM

## 2023-11-06 DIAGNOSIS — R591 Generalized enlarged lymph nodes: Secondary | ICD-10-CM

## 2023-11-07 ENCOUNTER — Ambulatory Visit
Admission: RE | Admit: 2023-11-07 | Discharge: 2023-11-07 | Disposition: A | Discharge status: Home / Self Care | Source: Ambulatory Visit | Attending: Family Medicine | Admitting: Family Medicine

## 2023-11-07 DIAGNOSIS — N6320 Unspecified lump in the left breast, unspecified quadrant: Secondary | ICD-10-CM | POA: Insufficient documentation

## 2023-11-07 DIAGNOSIS — R92323 Mammographic fibroglandular density, bilateral breasts: Secondary | ICD-10-CM | POA: Diagnosis not present

## 2023-11-07 DIAGNOSIS — N644 Mastodynia: Secondary | ICD-10-CM | POA: Diagnosis not present

## 2023-11-26 ENCOUNTER — Ambulatory Visit
Admission: RE | Admit: 2023-11-26 | Discharge: 2023-11-26 | Disposition: A | Discharge status: Home / Self Care | Source: Ambulatory Visit | Attending: Family Medicine | Admitting: Family Medicine

## 2023-11-26 DIAGNOSIS — R221 Localized swelling, mass and lump, neck: Secondary | ICD-10-CM | POA: Diagnosis not present

## 2023-11-26 DIAGNOSIS — R591 Generalized enlarged lymph nodes: Secondary | ICD-10-CM | POA: Insufficient documentation

## 2023-11-27 ENCOUNTER — Ambulatory Visit: Payer: Self-pay | Admitting: Family Medicine

## 2023-12-04 ENCOUNTER — Other Ambulatory Visit (HOSPITAL_COMMUNITY): Payer: Self-pay

## 2023-12-05 ENCOUNTER — Encounter: Payer: Self-pay | Admitting: Family Medicine

## 2023-12-05 ENCOUNTER — Ambulatory Visit: Admitting: Family Medicine

## 2023-12-05 VITALS — BP 116/68 | HR 90 | Resp 16 | Ht 68.0 in | Wt 181.8 lb

## 2023-12-05 DIAGNOSIS — I499 Cardiac arrhythmia, unspecified: Secondary | ICD-10-CM | POA: Diagnosis not present

## 2023-12-05 DIAGNOSIS — E785 Hyperlipidemia, unspecified: Secondary | ICD-10-CM

## 2023-12-05 DIAGNOSIS — F411 Generalized anxiety disorder: Secondary | ICD-10-CM

## 2023-12-05 DIAGNOSIS — Z7985 Long-term (current) use of injectable non-insulin antidiabetic drugs: Secondary | ICD-10-CM

## 2023-12-05 DIAGNOSIS — R569 Unspecified convulsions: Secondary | ICD-10-CM

## 2023-12-05 DIAGNOSIS — I639 Cerebral infarction, unspecified: Secondary | ICD-10-CM

## 2023-12-05 DIAGNOSIS — E1169 Type 2 diabetes mellitus with other specified complication: Secondary | ICD-10-CM | POA: Diagnosis not present

## 2023-12-05 DIAGNOSIS — E538 Deficiency of other specified B group vitamins: Secondary | ICD-10-CM

## 2023-12-05 DIAGNOSIS — G901 Familial dysautonomia [Riley-Day]: Secondary | ICD-10-CM | POA: Diagnosis not present

## 2023-12-05 DIAGNOSIS — I69354 Hemiplegia and hemiparesis following cerebral infarction affecting left non-dominant side: Secondary | ICD-10-CM | POA: Diagnosis not present

## 2023-12-05 DIAGNOSIS — F5104 Psychophysiologic insomnia: Secondary | ICD-10-CM

## 2023-12-05 DIAGNOSIS — G8929 Other chronic pain: Secondary | ICD-10-CM

## 2023-12-05 DIAGNOSIS — M542 Cervicalgia: Secondary | ICD-10-CM

## 2023-12-05 DIAGNOSIS — J683 Other acute and subacute respiratory conditions due to chemicals, gases, fumes and vapors: Secondary | ICD-10-CM

## 2023-12-05 LAB — POCT GLYCOSYLATED HEMOGLOBIN (HGB A1C): Hemoglobin A1C: 5.6 % (ref 4.0–5.6)

## 2023-12-05 MED ORDER — CYCLOBENZAPRINE HCL 10 MG PO TABS: 10.0000 mg | ORAL_TABLET | Freq: Every day | ORAL | 1 refills | Status: AC

## 2023-12-05 MED ORDER — PANTOPRAZOLE SODIUM 20 MG PO TBEC: 20.0000 mg | DELAYED_RELEASE_TABLET | Freq: Every day | ORAL | 1 refills | Status: AC

## 2023-12-05 MED ORDER — FLUTICASONE FUROATE-VILANTEROL 100-25 MCG/ACT IN AEPB: 1.0000 | INHALATION_SPRAY | Freq: Every day | RESPIRATORY_TRACT | 1 refills | Status: AC

## 2023-12-05 MED ORDER — CYANOCOBALAMIN 1000 MCG/ML IJ SOLN
1000.0000 ug | Freq: Once | INTRAMUSCULAR | Status: AC
  Administered 2023-12-05: 1000 ug via INTRAMUSCULAR

## 2023-12-05 MED ORDER — ALPRAZOLAM ER 1 MG PO TB24: 1.0000 mg | ORAL_TABLET | ORAL | 0 refills | Status: DC

## 2023-12-05 MED ORDER — SEMAGLUTIDE (1 MG/DOSE) 4 MG/3ML ~~LOC~~ SOPN: 1.0000 mg | PEN_INJECTOR | SUBCUTANEOUS | 0 refills | Status: DC

## 2023-12-05 MED ORDER — ZOLPIDEM TARTRATE ER 12.5 MG PO TBCR: 12.5000 mg | EXTENDED_RELEASE_TABLET | Freq: Every evening | ORAL | 0 refills | Status: DC | PRN

## 2023-12-05 NOTE — Progress Notes (Signed)
 Name: Alicia [name] Note Sula   MRN: [id number]    DOB: 1970/04/28   Date:12/05/2023       Progress Note  Subjective  Chief Complaint  Chief Complaint  Patient presents with   Medical Management of Chronic Issues   Discussed the use of AI scribe software for clinical note transcription with the patient, who gave verbal consent to proceed.  History of Present Illness Alicia [name] is a 53 year old female with dyslipidemia, type 2 diabetes, and a history of stroke who presents for a three-month follow-up.  She wears full compression socks, which are difficult to remove, and reports having a blister on her foot from walking in sandals on the beach. She experiences occasional shortness of breath but no chest pain.   She has dyslipidemia and type 2 diabetes, has made dietary changes including eliminating candy bars, and is taking Ozempic .  She denies polyphagia, polydipsia or polyuria. Her weight is trending down. No hypoglycemic episodes   She has a history of recurrent strokes, and states had an episode recently  with symptoms of projectile vomiting, confusion, dizziness, left-sided weakness, slurred speech, headache, and nausea. She reports that her speech and left-sided weakness have persisted since her strokes. She did not seek medical help during last episode due to cost of going to [facility], explained importance of calling 911 when needed. She also has episodes of seizure like activity and sees neurologist who prescribes lamictal  .  She experiences chronic insomnia and is currently on Ambien  CR 12.5 mg. She also has chronic neck pain due to degenerative spondylitis, for which she takes Flexeril . Past injections worsened her symptoms. She takes alprazolam  1 mg daily for anxiety and Paxil  for generalized anxiety disorde and MDD.  She has reactive airway disease and uses Breo, especially when working outdoors.   She has dysautonomia, affecting her daily activities, causing exhaustion and  missed work. She takes midodrine  to manage low blood pressure.    Patient Active Problem List   Diagnosis Date Noted   Dyslipidemia associated with type 2 diabetes mellitus (HCC) 09/10/2023   B12 deficiency 09/10/2023   Recurrent strokes (HCC) 09/09/2022   PVC's (premature ventricular contractions) 09/09/2022   Dysautonomia (HCC) 04/03/2022   Hemiparesis of left nondominant side as late effect of cerebral infarction (HCC) 04/03/2022   Mild episode of recurrent major depressive disorder 04/03/2022   HLA B27 (HLA B27 positive) 04/03/2022   Essential hypertension 08/30/2021   COVID-19 long hauler manifesting chronic fatigue 03/21/2020   Orthostatic hypotension 02/23/2020   Atypical chest pain 02/23/2020   Reactive airways dysfunction syndrome (HCC) 10/15/2019   Hyperfunction of pituitary gland, unspecified 10/15/2019   Calculus of gallbladder without cholecystitis without obstruction 05/06/2019   Cyst of pancreas 05/06/2019   Right kidney stone 05/06/2019   Leukocytoclastic vasculitis (HCC) 10/07/2018   Chronic insomnia 02/09/2018   History of anemia 02/09/2018   Sleep apnea 09/24/2017   History of ischemic stroke 05/30/2017   Hyperlipidemia 05/30/2017   Migraine without aura and without status migrainosus, not intractable 05/27/2017   Pain in right knee 01/21/2017   Psychogenic nonepileptic seizure    Chronic pain syndrome    Restless leg syndrome    Chronic prescription benzodiazepine use 01/20/2016   Leukocytosis 01/20/2016   Seizure-like activity (HCC)    GAD (generalized anxiety disorder) 02/14/2015   Chronic neck pain 02/14/2015   History of hysterectomy 02/14/2015   Iron deficiency anemia due to chronic blood loss 08/29/2014   Insomnia, persistent 08/14/2014  Major depression (HCC) 08/14/2014   Temporomandibular joint sounds on opening and/or closing the jaw 08/14/2014   Degenerative disc disease, lumbar 08/14/2014   Bleeding internal hemorrhoids 08/14/2014   Gastric  reflux 08/14/2014   Irritable bowel syndrome with constipation 08/14/2014   Hypertriglyceridemia 08/14/2014   Overweight 08/14/2014   Tinnitus 08/14/2014   Vitamin D  deficiency 08/14/2014   Sinus tachycardia 11/25/2012   DOE (dyspnea on exertion) 11/06/2012    Past Surgical History:  Procedure Laterality Date   ABDOMINOPLASTY  Feb. 2015   CESAREAN SECTION  2011   CHOLECYSTECTOMY N/A 05/31/2019   Procedure: LAPAROSCOPIC CHOLECYSTECTOMY WITH INTRAOPERATIVE CHOLANGIOGRAM;  Surgeon: Dessa Reyes ORN, MD;  Location: [facility] ORS;  Service: General;  Laterality: N/A;   COLONOSCOPY WITH PROPOFOL  N/A 11/26/2016   Procedure: COLONOSCOPY WITH PROPOFOL ;  Surgeon: Therisa Bi, MD;  Location: [facility] ENDOSCOPY;  Service: Gastroenterology;  Laterality: N/A;   COLONOSCOPY WITH PROPOFOL  N/A 08/08/2020   Procedure: COLONOSCOPY WITH PROPOFOL ;  Surgeon: Maryruth Ole DASEN, MD;  Location: [facility] ENDOSCOPY;  Service: Endoscopy;  Laterality: N/A;   CYSTOSCOPY  02/02/2015   Procedure: CYSTOSCOPY;  Surgeon: Lamar SHAUNNA Lesches, MD;  Location: [facility] ORS;  Service: Gynecology;;   DILATION AND CURETTAGE OF UTERUS  2003, 2005, 2008   ESOPHAGOGASTRODUODENOSCOPY N/A 05/31/2019   Procedure: ESOPHAGOGASTRODUODENOSCOPY (EGD);  Surgeon: Dessa Reyes ORN, MD;  Location: [facility] ORS;  Service: General;  Laterality: N/A;  in the O.R.   ESOPHAGOGASTRODUODENOSCOPY (EGD) WITH PROPOFOL  N/A 11/26/2016   Procedure: ESOPHAGOGASTRODUODENOSCOPY (EGD) WITH PROPOFOL ;  Surgeon: Therisa Bi, MD;  Location: [facility] ENDOSCOPY;  Service: Gastroenterology;  Laterality: N/A;   HERNIA REPAIR  1999   Umbilical   KNEE ARTHROSCOPY Right 2006   LAPAROSCOPIC BILATERAL SALPINGECTOMY Bilateral 02/02/2015   Procedure: LAPAROSCOPIC BILATERAL SALPINGECTOMY;  Surgeon: Lamar SHAUNNA Lesches, MD;  Location: [facility] ORS;  Service: Gynecology;  Laterality: Bilateral;   LAPAROSCOPIC HYSTERECTOMY N/A 02/02/2015   Procedure: HYSTERECTOMY TOTAL LAPAROSCOPIC;  Surgeon: Lamar SHAUNNA Lesches,  MD;  Location: [facility] ORS;  Service: Gynecology;  Laterality: N/A;   LOOP RECORDER INSERTION N/A 12/20/2016   Procedure: LOOP RECORDER INSERTION;  Surgeon: Inocencio Soyla Lunger, MD;  Location: [facility] INVASIVE CV LAB;  Service: Cardiovascular;  Laterality: N/A;   REDUCTION MAMMAPLASTY Bilateral December 2015   TEE WITHOUT CARDIOVERSION N/A 12/20/2016   Procedure: TRANSESOPHAGEAL ECHOCARDIOGRAM (TEE);  Surgeon: Maranda Leim DEL, MD;  Location: [facility] ENDOSCOPY;  Service: Cardiovascular;  Laterality: N/A;   TEE WITHOUT CARDIOVERSION N/A 03/29/2020   Procedure: TRANSESOPHAGEAL ECHOCARDIOGRAM (TEE);  Surgeon: Darliss Rogue, MD;  Location: [facility] ORS;  Service: Cardiovascular;  Laterality: N/A;    Family History  Problem Relation Age of Onset   Hypertension Mother    Hyperlipidemia Mother    Heart Problems Father        hole in heart and lower ventricles reversed   Prostate cancer Maternal Grandfather    Von Willebrand disease Maternal Uncle     Social History   Tobacco Use   Smoking status: Never   Smokeless tobacco: Never  Substance Use Topics   Alcohol use: No    Alcohol/week: 0.0 standard drinks of alcohol     Current Outpatient Medications:    ALPRAZolam  (XANAX  XR) 1 MG 24 hr tablet, Take 1 tablet (1 mg total) by mouth every morning., Disp: 90 tablet, Rfl: 0   clopidogrel  (PLAVIX ) 75 MG tablet, Take 1 tablet (75 mg total) by mouth daily., Disp: 90 tablet, Rfl: 1   cyclobenzaprine  (FLEXERIL ) 10 MG tablet, Take 1 tablet (10 mg  total) by mouth at bedtime., Disp: 90 tablet, Rfl: 1   Dapsone  (ACZONE ) 7.5 % GEL, Apply 1 Application topically as directed. qd to bid for acne and perioral dermatitis, Disp: 60 g, Rfl: 11   fluticasone  furoate-vilanterol (BREO ELLIPTA ) 100-25 MCG/ACT AEPB, Inhale 1 puff into the lungs daily., Disp: 180 each, Rfl: 1   gabapentin  (NEURONTIN ) 100 MG capsule, Take 1 capsule (100 mg total) by mouth 3 (three) times daily., Disp: 270 capsule, Rfl: 1   lamoTRIgine   (LAMICTAL ) 100 MG tablet, Take 100 mg by mouth 2 (two) times daily., Disp: , Rfl:    midodrine  (PROAMATINE ) 5 MG tablet, TAKE 1 TABLET BY MOUTH THREE TIMES DAILY AT 7 AM, 11 AM, AND 3 PM, Disp: 270 tablet, Rfl: 3   pantoprazole  (PROTONIX ) 20 MG tablet, Take 1 tablet (20 mg total) by mouth daily., Disp: 90 tablet, Rfl: 1   PARoxetine  (PAXIL -CR) 25 MG 24 hr tablet, TAKE 2 TABLET BY MOUTH DAILY, Disp: 180 tablet, Rfl: 1   rosuvastatin  (CRESTOR ) 10 MG tablet, Take 1 tablet (10 mg total) by mouth at bedtime., Disp: 90 tablet, Rfl: 1   Semaglutide , 1 MG/DOSE, 4 MG/3ML SOPN, Inject 1 mg as directed once a week., Disp: 9 mL, Rfl: 0   UBRELVY  100 MG TABS, Take 100 mg by mouth daily as needed (migraine). , Disp: , Rfl:    Vitamin D , Ergocalciferol , (DRISDOL ) 1.25 MG (50000 UNIT) CAPS capsule, Take 50,000 Units by mouth once a week., Disp: , Rfl:    zolpidem  (AMBIEN  CR) 12.5 MG CR tablet, Take 1 tablet (12.5 mg total) by mouth at bedtime as needed for sleep., Disp: 90 tablet, Rfl: 0   Cholecalciferol  (VITAMIN D3) 50 MCG (2000 UT) capsule, Take 1 capsule (2,000 Units total) by mouth daily. (Patient not taking: Reported on 12/05/2023), Disp: 100 capsule, Rfl: 0   nitrofurantoin , macrocrystal-monohydrate, (MACROBID ) 100 MG capsule, Take 1 capsule (100 mg total) by mouth 2 (two) times daily. (Patient not taking: Reported on 12/05/2023), Disp: 10 capsule, Rfl: 0  Allergies  Allergen Reactions   Azithromycin  Diarrhea   Penicillins Hives and Swelling    Has patient had a PCN reaction causing immediate rash, facial/tongue/throat swelling, SOB or lightheadedness with hypotension: Yes Has patient had a PCN reaction causing severe rash involving mucus membranes or skin necrosis: No Has patient had a PCN reaction that required hospitalization No Has patient had a PCN reaction occurring within the last 10 years: No If all of the above answers are NO, then may proceed with Cephalosporin use.    I personally reviewed  active problem list, medication list, allergies, family history with the patient/caregiver today.   ROS  Ten systems reviewed and is negative except as mentioned in HPI    Objective Physical Exam VITALS: BP- 116/68 MEASUREMENTS: Weight- 181.8. CONSTITUTIONAL: Patient appears well-developed and well-nourished. No distress. HEENT: Head atraumatic, normocephalic, neck supple. CARDIOVASCULAR: Normal rate, regular rhythm and normal heart sounds. No murmur heard. No BLE edema. Blister on foot. PULMONARY: Effort normal and breath sounds normal. No respiratory distress. ABDOMINAL: There is no tenderness or distention. MUSCULOSKELETAL: Normal gait. Without gross motor or sensory deficit. PSYCHIATRIC: Patient has a normal mood and affect. Behavior is normal. Judgment and thought content normal. NEUROLOGICAL: Sensation intact in feet.  Vitals:   12/05/23 0947  BP: 116/68  Pulse: 90  Resp: 16  SpO2: 99%  Weight: 181 lb 12.8 oz (82.5 kg)  Height: 5' 8 (1.727 m)    Body mass index is 27.64  kg/m.  Recent Results (from the past 2160 hours)  POCT glycosylated hemoglobin (Hb A1C)     Status: None   Collection Time: 09/10/23  8:44 AM  Result Value Ref Range   Hemoglobin A1C 5.4 4.0 - 5.6 %   HbA1c POC (<> result, manual entry)     HbA1c, POC (prediabetic range)     HbA1c, POC (controlled diabetic range)    Urine Microalbumin w/creat. ratio     Status: Abnormal   Collection Time: 09/10/23  9:41 AM  Result Value Ref Range   Creatinine, Urine 365 (H) 20 - 275 mg/dL    Comment: Verified by repeat analysis. .    Microalb, Ur 2.1 mg/dL    Comment: Reference Range Not established    Microalb Creat Ratio 6 <30 mg/g creat    Comment: . The ADA defines abnormalities in albumin  excretion as follows: SABRA Albuminuria Category        Result (mg/g creatinine) . Normal to Mildly increased   <30 Moderately increased         30-299  Severely increased           > OR = 300 . The ADA  recommends that at least two of three specimens collected within a 3-6 month period be abnormal before considering a patient to be within a diagnostic category.   Lipid panel     Status: Abnormal   Collection Time: 09/10/23  9:41 AM  Result Value Ref Range   Cholesterol 242 (H) <200 mg/dL   HDL 38 (L) > OR = 50 mg/dL   Triglycerides 610 (H) <150 mg/dL    Comment: . If a non-fasting specimen was collected, consider repeat triglyceride testing on a fasting specimen if clinically indicated.  Veatrice et al. J. of Clin. Lipidol. 2015;9:129-169. SABRA    LDL Cholesterol (Calc) 144 (H) mg/dL (calc)    Comment: Reference range: <100 . Desirable range <100 mg/dL for primary prevention;   <70 mg/dL for patients with CHD or diabetic patients  with > or = 2 CHD risk factors. SABRA LDL-C is now calculated using the Martin-Hopkins  calculation, which is a validated novel method providing  better accuracy than the Friedewald equation in the  estimation of LDL-C.  Gladis APPLETHWAITE et al. SANDREA. 7986;689(80): 2061-2068  (http://education.[facility].com/faq/FAQ164)    Total CHOL/HDL Ratio 6.4 (H) <5.0 (calc)   Non-HDL Cholesterol (Calc) 204 (H) <130 mg/dL (calc)    Comment: For patients with diabetes plus 1 major ASCVD risk  factor, treating to a non-HDL-C goal of <100 mg/dL  (LDL-C of <29 mg/dL) is considered a therapeutic  option.   B12 and Folate Panel     Status: None   Collection Time: 09/10/23  9:41 AM  Result Value Ref Range   Vitamin B-12 344 200 - 1,100 pg/mL    Comment: . Please Note: Although the reference range for vitamin B12 is [phone] pg/mL, it has been reported that between 5 and 10% of patients with values between 200 and 400 pg/mL may experience neuropsychiatric and hematologic abnormalities due to occult B12 deficiency; less than 1% of patients with values above 400 pg/mL will have symptoms. .    Folate 6.0 ng/mL    Comment:                            Reference Range  Low:           <3.4                            Borderline:    3.4-5.4                            Normal:        >5.4 .   CBC with Differential/Platelet     Status: None   Collection Time: 09/10/23  9:41 AM  Result Value Ref Range   WBC 8.4 3.8 - 10.8 Thousand/uL   RBC 4.33 3.80 - 5.10 Million/uL   Hemoglobin 14.2 11.7 - 15.5 g/dL   HCT 58.3 64.9 - 54.9 %   MCV 96.1 80.0 - 100.0 fL   MCH 32.8 27.0 - 33.0 pg   MCHC 34.1 32.0 - 36.0 g/dL    Comment: For adults, a slight decrease in the calculated MCHC value (in the range of 30 to 32 g/dL) is most likely not clinically significant; however, it should be interpreted with caution in correlation with other red cell parameters and the patient's clinical condition.    RDW 12.6 11.0 - 15.0 %   Platelets 264 140 - 400 Thousand/uL   MPV 10.1 7.5 - 12.5 fL   Neutro Abs 6,182 1,500 - 7,800 cells/uL   Absolute Lymphocytes 1,630 850 - 3,900 cells/uL   Absolute Monocytes 420 200 - 950 cells/uL   Eosinophils Absolute 126 15 - 500 cells/uL   Basophils Absolute 42 0 - 200 cells/uL   Neutrophils Relative % 73.6 %   Total Lymphocyte 19.4 %   Monocytes Relative 5.0 %   Eosinophils Relative 1.5 %   Basophils Relative 0.5 %  Comprehensive metabolic panel with GFR     Status: Abnormal   Collection Time: 09/10/23  9:41 AM  Result Value Ref Range   Glucose, Bld 91 65 - 99 mg/dL    Comment: .            Fasting reference interval .    BUN 24 7 - 25 mg/dL   Creat 8.91 (H) 9.49 - 1.03 mg/dL   eGFR 62 > OR = 60 fO/fpw/8.26f7   BUN/Creatinine Ratio 22 6 - 22 (calc)   Sodium 139 135 - 146 mmol/L   Potassium 4.5 3.5 - 5.3 mmol/L   Chloride 101 98 - 110 mmol/L   CO2 30 20 - 32 mmol/L   Calcium  9.7 8.6 - 10.4 mg/dL   Total Protein 6.5 6.1 - 8.1 g/dL   Albumin  4.6 3.6 - 5.1 g/dL   Globulin 1.9 1.9 - 3.7 g/dL (calc)   AG Ratio 2.4 1.0 - 2.5 (calc)   Total Bilirubin 0.6 0.2 - 1.2 mg/dL   Alkaline phosphatase (APISO) 67 37 -  153 U/L   AST 13 10 - 35 U/L   ALT 14 6 - 29 U/L  CK (Creatine Kinase)     Status: None   Collection Time: 09/10/23  9:41 AM  Result Value Ref Range   Total CK 31 21 - 240 U/L  CULTURE, URINE COMPREHENSIVE     Status: None   Collection Time: 09/10/23  9:41 AM  Result Value Ref Range   MICRO NUMBER: [id number]    SPECIMEN QUALITY: Adequate    Source OTHER (SPECIFY)    STATUS: FINAL    RESULT:      Mixed genital flora isolated.  These superficial bacteria are not indicative of a urinary tract infection. No further organism identification is warranted on this specimen. If clinically indicated, recollect clean-catch, mid-stream urine and transfer  immediately to Urine Culture Transport Tube.   POCT glycosylated hemoglobin (Hb A1C)     Status: None   Collection Time: 12/05/23  9:52 AM  Result Value Ref Range   Hemoglobin A1C 5.6 4.0 - 5.6 %   HbA1c POC (<> result, manual entry)     HbA1c, POC (prediabetic range)     HbA1c, POC (controlled diabetic range)      Diabetic Foot Exam:  Diabetic foot exam was performed with the following findings:   Normal sensation of 10g monofilament Intact posterior tibialis and dorsalis pedis pulses Healing blister left foot near first toe      PHQ2/9:    12/05/2023    9:36 AM 09/10/2023    8:32 AM 06/18/2023    7:56 AM 03/26/2023    8:23 AM 12/24/2022    7:45 AM  Depression screen PHQ 2/9  Decreased Interest 1 0 0 0 0  Down, Depressed, Hopeless 1 0 0 0 0  PHQ - 2 Score 2 0 0 0 0  Altered sleeping 0  0 0   Tired, decreased energy 1  0 0   Change in appetite 0  0 0   Feeling bad or failure about yourself  0  0 0   Trouble concentrating 1  0 0   Moving slowly or fidgety/restless 0  0 0   Suicidal thoughts 0  0 0   PHQ-9 Score 4  0 0   Difficult doing work/chores Somewhat difficult  Not difficult at all Not difficult at all     phq 9 is positive  Fall Risk:    12/05/2023    9:36 AM 09/10/2023    8:32 AM 03/26/2023    8:20 AM 12/24/2022     7:45 AM 10/02/2022    8:01 AM  Fall Risk   Falls in the past year? 0 0 0 0 0  Number falls in past yr: 0 0 0 0 0  Injury with Fall? 0 0 0 0 0  Risk for fall due to : No Fall Risks No Fall Risks No Fall Risks No Fall Risks No Fall Risks  Follow up Falls evaluation completed Falls evaluation completed Falls prevention discussed;Education provided;Falls evaluation completed Falls prevention discussed Falls prevention discussed      Assessment & Plan Recurrent stroke with left hemiparesis and seizure disorder Multiple strokes with persistent left-sided weakness and speech difficulties. Seizure disorder managed with Lamictal . - Continue Lamictal  100 mg twice daily. - Continue plavix  - Continue statin therapy  - Ensure follow-up with neurologist.  Dysautonomia with orthostatic hypotension Significant impact on daily activities. Requires midodrine  to maintain blood pressure and prevent syncope. - Continue midodrine  as prescribed by Dr. Fernande. - Avoid medications that lower blood pressure.  Irregular heart beat auscultation  Frequent PVCs with occasional shortness of breath. Last EKG showed normal sinus rhythm. - Perform EKG today. - Refer to Dr. Fernande, electrophysiologist, if EKG is abnormal.  Type 2 diabetes mellitus with dyslipidemia Diabetes management is effective with dietary changes and Ozempic . - Continue Ozempic  1 mg. - Reinforce dietary modifications.  Chronic neck pain with cervical spondylosis and muscle spasms Chronic neck pain due to cervical spondylosis with limited effectiveness of Flexeril . - Continue Flexeril  as needed for muscle spasms.  Generalized anxiety disorder and recurrent Major depression Anxiety managed with  alprazolam  and Paxil . History of depression noted. - Continue alprazolam  1 mg daily. - Continue Paxil  CR as prescribed. - Refill alprazolam  with next due date on November 1.  Chronic insomnia Managed with Ambien  CR 12.5 mg. Discussed risks of  tolerance with higher doses.  Reactive airway dysfunction syndrome Managed with Breo, especially for outdoor activities. - Continue Breo as needed.  Vitamin D  deficiency Managed with prescription vitamin D . Plan to transition to over-the-counter vitamin D . - Continue prescription vitamin D . - Transition to over-the-counter vitamin D  2000 IU after prescription course.  Dyslipidemia Dyslipidemia noted as a risk factor.  Gastroesophageal reflux disease GERD managed with pantoprazole . Occasional reflux symptoms noted. - Continue pantoprazole  as needed.  Blister, right foot, healing Healing blister on the right foot.

## 2023-12-15 ENCOUNTER — Other Ambulatory Visit: Payer: Self-pay

## 2023-12-15 ENCOUNTER — Emergency Department

## 2023-12-15 ENCOUNTER — Emergency Department
Admission: EM | Admit: 2023-12-15 | Discharge: 2023-12-15 | Disposition: A | Discharge status: Home / Self Care | Attending: Emergency Medicine | Admitting: Emergency Medicine

## 2023-12-15 ENCOUNTER — Encounter: Payer: Self-pay | Admitting: Emergency Medicine

## 2023-12-15 DIAGNOSIS — K5730 Diverticulosis of large intestine without perforation or abscess without bleeding: Secondary | ICD-10-CM | POA: Diagnosis not present

## 2023-12-15 DIAGNOSIS — Z8673 Personal history of transient ischemic attack (TIA), and cerebral infarction without residual deficits: Secondary | ICD-10-CM | POA: Diagnosis not present

## 2023-12-15 DIAGNOSIS — R21 Rash and other nonspecific skin eruption: Secondary | ICD-10-CM | POA: Diagnosis not present

## 2023-12-15 DIAGNOSIS — N390 Urinary tract infection, site not specified: Secondary | ICD-10-CM | POA: Diagnosis not present

## 2023-12-15 DIAGNOSIS — I10 Essential (primary) hypertension: Secondary | ICD-10-CM | POA: Insufficient documentation

## 2023-12-15 DIAGNOSIS — I959 Hypotension, unspecified: Secondary | ICD-10-CM | POA: Diagnosis not present

## 2023-12-15 DIAGNOSIS — R161 Splenomegaly, not elsewhere classified: Secondary | ICD-10-CM | POA: Diagnosis not present

## 2023-12-15 DIAGNOSIS — R109 Unspecified abdominal pain: Secondary | ICD-10-CM | POA: Diagnosis not present

## 2023-12-15 DIAGNOSIS — R509 Fever, unspecified: Secondary | ICD-10-CM | POA: Diagnosis not present

## 2023-12-15 DIAGNOSIS — R6889 Other general symptoms and signs: Secondary | ICD-10-CM | POA: Diagnosis not present

## 2023-12-15 DIAGNOSIS — R10A1 Flank pain, right side: Secondary | ICD-10-CM | POA: Diagnosis not present

## 2023-12-15 LAB — BASIC METABOLIC PANEL WITH GFR
Anion gap: 14 (ref 5–15)
BUN: 14 mg/dL (ref 6–20)
CO2: 23 mmol/L (ref 22–32)
Calcium: 9.4 mg/dL (ref 8.9–10.3)
Chloride: 103 mmol/L (ref 98–111)
Creatinine, Ser: 1.12 mg/dL — ABNORMAL HIGH (ref 0.44–1.00)
GFR, Estimated: 59 mL/min — ABNORMAL LOW (ref >60)
Glucose, Bld: 114 mg/dL — ABNORMAL HIGH (ref 70–99)
Potassium: 3.9 mmol/L (ref 3.5–5.1)
Sodium: 140 mmol/L (ref 135–145)

## 2023-12-15 LAB — URINALYSIS, ROUTINE W REFLEX MICROSCOPIC
Glucose, UA: NEGATIVE mg/dL
Hgb urine dipstick: NEGATIVE
Ketones, ur: NEGATIVE mg/dL
Leukocytes,Ua: NEGATIVE
Nitrite: NEGATIVE
Protein, ur: 30 mg/dL — AB
Specific Gravity, Urine: 1.04 — ABNORMAL HIGH (ref 1.005–1.030)
pH: 5 (ref 5.0–8.0)

## 2023-12-15 LAB — RESP PANEL BY RT-PCR (RSV, FLU A&B, COVID)  RVPGX2
Influenza A by PCR: NEGATIVE
Influenza B by PCR: NEGATIVE
Resp Syncytial Virus by PCR: NEGATIVE
SARS Coronavirus 2 by RT PCR: NEGATIVE

## 2023-12-15 LAB — LACTIC ACID, PLASMA: Lactic Acid, Venous: 1.8 mmol/L (ref 0.5–1.9)

## 2023-12-15 LAB — CBC
HCT: 41.7 % (ref 36.0–46.0)
Hemoglobin: 14.7 g/dL (ref 12.0–15.0)
MCH: 31.8 pg (ref 26.0–34.0)
MCHC: 35.3 g/dL (ref 30.0–36.0)
MCV: 90.3 fL (ref 80.0–100.0)
Platelets: 258 K/uL (ref 150–400)
RBC: 4.62 MIL/uL (ref 3.87–5.11)
RDW: 12.4 % (ref 11.5–15.5)
WBC: 11.8 K/uL — ABNORMAL HIGH (ref 4.0–10.5)
nRBC: 0 % (ref 0.0–0.2)

## 2023-12-15 MED ORDER — KETOROLAC TROMETHAMINE 30 MG/ML IJ SOLN
30.0000 mg | Freq: Once | INTRAMUSCULAR | Status: AC
  Administered 2023-12-15: 30 mg via INTRAVENOUS
  Filled 2023-12-15: qty 1

## 2023-12-15 MED ORDER — CEPHALEXIN 500 MG PO CAPS: 500.0000 mg | ORAL_CAPSULE | Freq: Two times a day (BID) | ORAL | 0 refills | Status: DC

## 2023-12-15 MED ORDER — SODIUM CHLORIDE 0.9 % IV BOLUS
500.0000 mL | Freq: Once | INTRAVENOUS | Status: AC
  Administered 2023-12-15: 500 mL via INTRAVENOUS

## 2023-12-15 MED ORDER — IOHEXOL 300 MG/ML  SOLN
100.0000 mL | Freq: Once | INTRAMUSCULAR | Status: AC | PRN
  Administered 2023-12-15: 100 mL via INTRAVENOUS

## 2023-12-15 MED ORDER — CEPHALEXIN 500 MG PO CAPS
500.0000 mg | ORAL_CAPSULE | Freq: Once | ORAL | Status: AC
  Administered 2023-12-15: 500 mg via ORAL
  Filled 2023-12-15: qty 1

## 2023-12-15 NOTE — ED Triage Notes (Signed)
 Pt in via POV, sent over from [facility].  Patient presents w/ complaints of fever, and hives across abdomen; all of which she noticed last night.  Patient also reports some urinary frequency.  NAD noted at this time.

## 2023-12-15 NOTE — ED Triage Notes (Signed)
 Pt from [facility] with c/o shakes, fever and rash across abdomen that started last night. Pt recently dx with URI.SABRA Pt BP was low at [facility] as well.

## 2023-12-15 NOTE — ED Provider Notes (Signed)
 [facility] Provider Note    Event Date/Time   First MD Initiated Contact with Patient 12/15/23 1516     (approximate)   History   Urticaria and Fever   HPI  Alicia [name] Note Alicia [name] is a 53 y.o. female with a history of CVA, hypertension who presents with rash to the abdomen which is nonpruritic, nonpainful which developed overnight.  She also had chills last night.  She reports some pain in her right flank as well.  No dysuria but does report some vaginal itching.  Sent from urgent care because of tachycardia and borderline blood pressure     Physical Exam   Triage Vital Signs: ED Triage Vitals  Encounter Vitals Group     BP 12/15/23 1410 110/69     Girls Systolic BP Percentile --      Girls Diastolic BP Percentile --      Boys Systolic BP Percentile --      Boys Diastolic BP Percentile --      Pulse Rate 12/15/23 1410 (!) 127     Resp 12/15/23 1410 16     Temp 12/15/23 1410 99.5 F (37.5 C)     Temp Source 12/15/23 1410 Oral     SpO2 12/15/23 1410 98 %     Weight 12/15/23 1412 80.7 kg (178 lb)     Height 12/15/23 1412 1.727 m (5' 8)     Head Circumference --      Peak Flow --      Pain Score 12/15/23 1412 0     Pain Loc --      Pain Education --      Exclude from Growth Chart --     Most recent vital signs: Vitals:   12/15/23 1900 12/15/23 1955  BP:  109/63  Pulse:  80  Resp:  16  Temp: 98.3 F (36.8 C) 97.7 F (36.5 C)  SpO2:  98%     General: Awake, no distress.  CV:  Good peripheral perfusion.  Resp:  Normal effort.  Abd:  No distention.  Mild right CVA tenderness, otherwise benign abdomen exam Other:  Erythema that crosses the midline of the lower abdomen and upper abdomen, splotchy but nonpruritic,    ED Results / Procedures / Treatments   Labs (all labs ordered are listed, but only abnormal results are displayed) Labs Reviewed  URINALYSIS, ROUTINE W REFLEX MICROSCOPIC - Abnormal; Notable for the following  components:      Result Value   Color, Urine AMBER (*)    APPearance HAZY (*)    Specific Gravity, Urine 1.040 (*)    Bilirubin Urine SMALL (*)    Protein, ur 30 (*)    Bacteria, UA RARE (*)    All other components within normal limits  BASIC METABOLIC PANEL WITH GFR - Abnormal; Notable for the following components:   Glucose, Bld 114 (*)    Creatinine, Ser 1.12 (*)    GFR, Estimated 59 (*)    All other components within normal limits  CBC - Abnormal; Notable for the following components:   WBC 11.8 (*)    All other components within normal limits  RESP PANEL BY RT-PCR (RSV, FLU A&B, COVID)  RVPGX2  CULTURE, BLOOD (ROUTINE X 2)  CULTURE, BLOOD (ROUTINE X 2)  URINE CULTURE  LACTIC ACID, PLASMA  LACTIC ACID, PLASMA     EKG     RADIOLOGY Chest x-ray viewed interpret by me, no pneumonia, no gas on CT  abdomen pelvis    PROCEDURES:  Critical Care performed:   Procedures   MEDICATIONS ORDERED IN ED: Medications  sodium chloride  0.9 % bolus 500 mL (0 mLs Intravenous Stopped 12/15/23 1658)  ketorolac  (TORADOL ) 30 MG/ML injection 30 mg (30 mg Intravenous Given 12/15/23 1556)  iohexol  (OMNIPAQUE ) 300 MG/ML solution 100 mL (100 mLs Intravenous Contrast Given 12/15/23 1656)  cephALEXin  (KEFLEX ) capsule 500 mg (500 mg Oral Given 12/15/23 1954)     IMPRESSION / MDM / ASSESSMENT AND PLAN / ED COURSE  I reviewed the triage vital signs and the nursing notes. Patient's presentation is most consistent with acute presentation with potential threat to life or bodily function.  Patient presents with chills tachycardia, rash as described above, does not appear to be consistent with anaphylaxis, rash is limited to the anterior abdomen.  Abdominal exam is overall benign although she does have some right CVA tenderness  Urinalysis suggestive of mild UTI, given the, [place] of the right CVA tenderness and urinalysis will send for CT abdomen pelvis, have added on lactic and blood cultures  as well, minimal elevation in white blood cell count noted.  Temperature 99.5 here  CT abdomen and pelvis is reassuring, COVID flu negative, chest x-ray without evidence of pneumonia  Patient feeling much better, heart rate has normalized, afebrile.  Discussed with her at length and husband strict return precautions, they agree with this plan and are comfortable, will treat with Keflex  p.o.      FINAL CLINICAL IMPRESSION(S) / ED DIAGNOSES   Final diagnoses:  Rash  Lower urinary tract infectious disease     Rx / DC Orders   ED Discharge Orders          Ordered    cephALEXin  (KEFLEX ) 500 MG capsule  2 times daily        12/15/23 1946             Note:  This document was prepared using Dragon voice recognition software and may include unintentional dictation errors.   Alicia Charleston, MD 12/15/23 2005

## 2023-12-16 LAB — URINE CULTURE

## 2023-12-20 DIAGNOSIS — K567 Ileus, unspecified: Secondary | ICD-10-CM | POA: Diagnosis not present

## 2023-12-20 DIAGNOSIS — R200 Anesthesia of skin: Secondary | ICD-10-CM | POA: Diagnosis not present

## 2023-12-20 DIAGNOSIS — Z79899 Other long term (current) drug therapy: Secondary | ICD-10-CM | POA: Diagnosis not present

## 2023-12-20 DIAGNOSIS — E785 Hyperlipidemia, unspecified: Secondary | ICD-10-CM | POA: Diagnosis not present

## 2023-12-20 DIAGNOSIS — R1110 Vomiting, unspecified: Secondary | ICD-10-CM | POA: Diagnosis not present

## 2023-12-20 DIAGNOSIS — Z7902 Long term (current) use of antithrombotics/antiplatelets: Secondary | ICD-10-CM | POA: Diagnosis not present

## 2023-12-20 DIAGNOSIS — I639 Cerebral infarction, unspecified: Secondary | ICD-10-CM | POA: Diagnosis not present

## 2023-12-20 DIAGNOSIS — Z7985 Long-term (current) use of injectable non-insulin antidiabetic drugs: Secondary | ICD-10-CM | POA: Diagnosis not present

## 2023-12-20 DIAGNOSIS — R29818 Other symptoms and signs involving the nervous system: Secondary | ICD-10-CM | POA: Diagnosis not present

## 2023-12-20 DIAGNOSIS — F419 Anxiety disorder, unspecified: Secondary | ICD-10-CM | POA: Diagnosis not present

## 2023-12-20 DIAGNOSIS — Z880 Allergy status to penicillin: Secondary | ICD-10-CM | POA: Diagnosis not present

## 2023-12-20 DIAGNOSIS — R519 Headache, unspecified: Secondary | ICD-10-CM | POA: Diagnosis not present

## 2023-12-20 DIAGNOSIS — R29810 Facial weakness: Secondary | ICD-10-CM | POA: Diagnosis not present

## 2023-12-20 DIAGNOSIS — Z8673 Personal history of transient ischemic attack (TIA), and cerebral infarction without residual deficits: Secondary | ICD-10-CM | POA: Diagnosis not present

## 2023-12-20 DIAGNOSIS — I10 Essential (primary) hypertension: Secondary | ICD-10-CM | POA: Diagnosis not present

## 2023-12-20 DIAGNOSIS — R29898 Other symptoms and signs involving the musculoskeletal system: Secondary | ICD-10-CM | POA: Diagnosis not present

## 2023-12-20 DIAGNOSIS — R531 Weakness: Secondary | ICD-10-CM | POA: Diagnosis not present

## 2023-12-20 DIAGNOSIS — I6621 Occlusion and stenosis of right posterior cerebral artery: Secondary | ICD-10-CM | POA: Diagnosis not present

## 2023-12-20 DIAGNOSIS — G901 Familial dysautonomia [Riley-Day]: Secondary | ICD-10-CM | POA: Diagnosis not present

## 2023-12-20 DIAGNOSIS — F430 Acute stress reaction: Secondary | ICD-10-CM | POA: Diagnosis not present

## 2023-12-20 LAB — CULTURE, BLOOD (ROUTINE X 2)
Culture: NO GROWTH
Culture: NO GROWTH
Special Requests: ADEQUATE
Special Requests: ADEQUATE

## 2023-12-21 DIAGNOSIS — R531 Weakness: Secondary | ICD-10-CM | POA: Diagnosis not present

## 2023-12-21 DIAGNOSIS — K6389 Other specified diseases of intestine: Secondary | ICD-10-CM | POA: Diagnosis not present

## 2023-12-21 DIAGNOSIS — R2990 Unspecified symptoms and signs involving the nervous system: Secondary | ICD-10-CM | POA: Diagnosis not present

## 2023-12-21 DIAGNOSIS — R1110 Vomiting, unspecified: Secondary | ICD-10-CM | POA: Diagnosis not present

## 2023-12-21 DIAGNOSIS — R200 Anesthesia of skin: Secondary | ICD-10-CM | POA: Diagnosis not present

## 2023-12-22 DIAGNOSIS — R531 Weakness: Secondary | ICD-10-CM | POA: Diagnosis not present

## 2023-12-22 DIAGNOSIS — R2990 Unspecified symptoms and signs involving the nervous system: Secondary | ICD-10-CM | POA: Diagnosis not present

## 2023-12-22 DIAGNOSIS — F430 Acute stress reaction: Secondary | ICD-10-CM | POA: Diagnosis not present

## 2023-12-23 DIAGNOSIS — F430 Acute stress reaction: Secondary | ICD-10-CM | POA: Diagnosis not present

## 2023-12-24 ENCOUNTER — Other Ambulatory Visit (HOSPITAL_COMMUNITY): Payer: Self-pay

## 2024-01-13 ENCOUNTER — Other Ambulatory Visit: Payer: Self-pay | Admitting: Family Medicine

## 2024-01-13 NOTE — Telephone Encounter (Signed)
 Requested Prescriptions  Pending Prescriptions Disp Refills   gabapentin  (NEURONTIN ) 100 MG capsule [Pharmacy Med Name: GABAPENTIN  100 MG CAPSULE] 270 capsule 1    Sig: TAKE 1 CAPSULE (100 MG TOTAL) BY MOUTH THREE TIMES DAILY.     Neurology: Anticonvulsants - gabapentin  Failed - 01/13/2024  5:38 PM      Failed - Cr in normal range and within 360 days    Creat  Date Value Ref Range Status  09/10/2023 1.08 (H) 0.50 - 1.03 mg/dL Final   Creatinine, Ser  Date Value Ref Range Status  12/15/2023 1.12 (H) 0.44 - 1.00 mg/dL Final   Creatinine, Urine  Date Value Ref Range Status  09/10/2023 365 (H) 20 - 275 mg/dL Final    Comment:    Verified by repeat analysis. SABRA Amy - Completed PHQ-2 or PHQ-9 in the last 360 days      Passed - Valid encounter within last 12 months    Recent Outpatient Visits           1 month ago Dyslipidemia associated with type 2 diabetes mellitus [facility])   [place] [facility] Glenard Mire, MD   4 months ago Dyslipidemia associated with type 2 diabetes mellitus [facility])   [place] [facility] Glenard Mire, MD   6 months ago Dyslipidemia associated with type 2 diabetes mellitus [facility])   [place] [facility] Glenard Mire, MD   9 months ago Recurrent strokes [facility])   [facility] Sowles, [name], MD       Future Appointments             In 4 months Claudene Lehmann, MD [facility]

## 2024-01-14 ENCOUNTER — Encounter: Payer: Self-pay | Admitting: Family Medicine

## 2024-02-10 DIAGNOSIS — K862 Cyst of pancreas: Secondary | ICD-10-CM | POA: Diagnosis not present

## 2024-02-10 DIAGNOSIS — Q453 Other congenital malformations of pancreas and pancreatic duct: Secondary | ICD-10-CM | POA: Diagnosis not present

## 2024-02-25 ENCOUNTER — Ambulatory Visit
Admission: RE | Admit: 2024-02-25 | Discharge: 2024-02-25 | Disposition: A | Discharge status: Home / Self Care | Attending: Family Medicine | Admitting: Family Medicine

## 2024-02-25 ENCOUNTER — Other Ambulatory Visit (HOSPITAL_COMMUNITY): Payer: Self-pay

## 2024-02-25 ENCOUNTER — Ambulatory Visit
Admission: RE | Admit: 2024-02-25 | Discharge: 2024-02-25 | Disposition: A | Discharge status: Home / Self Care | Source: Ambulatory Visit | Attending: Family Medicine | Admitting: Family Medicine

## 2024-02-25 ENCOUNTER — Telehealth: Payer: Self-pay | Admitting: Pharmacy Technician

## 2024-02-25 ENCOUNTER — Ambulatory Visit: Admitting: Family Medicine

## 2024-02-25 ENCOUNTER — Encounter: Payer: Self-pay | Admitting: Family Medicine

## 2024-02-25 VITALS — BP 110/74 | HR 98 | Resp 16 | Ht 68.0 in | Wt 173.0 lb

## 2024-02-25 DIAGNOSIS — R053 Chronic cough: Secondary | ICD-10-CM | POA: Diagnosis present

## 2024-02-25 DIAGNOSIS — Z7985 Long-term (current) use of injectable non-insulin antidiabetic drugs: Secondary | ICD-10-CM

## 2024-02-25 DIAGNOSIS — R569 Unspecified convulsions: Secondary | ICD-10-CM | POA: Diagnosis not present

## 2024-02-25 DIAGNOSIS — F330 Major depressive disorder, recurrent, mild: Secondary | ICD-10-CM | POA: Diagnosis not present

## 2024-02-25 DIAGNOSIS — G720 Drug-induced myopathy: Secondary | ICD-10-CM | POA: Diagnosis not present

## 2024-02-25 DIAGNOSIS — I69354 Hemiplegia and hemiparesis following cerebral infarction affecting left non-dominant side: Secondary | ICD-10-CM | POA: Diagnosis not present

## 2024-02-25 DIAGNOSIS — F5104 Psychophysiologic insomnia: Secondary | ICD-10-CM | POA: Diagnosis not present

## 2024-02-25 DIAGNOSIS — I639 Cerebral infarction, unspecified: Secondary | ICD-10-CM

## 2024-02-25 DIAGNOSIS — F411 Generalized anxiety disorder: Secondary | ICD-10-CM | POA: Diagnosis not present

## 2024-02-25 DIAGNOSIS — E785 Hyperlipidemia, unspecified: Secondary | ICD-10-CM | POA: Diagnosis not present

## 2024-02-25 DIAGNOSIS — E1169 Type 2 diabetes mellitus with other specified complication: Secondary | ICD-10-CM | POA: Diagnosis not present

## 2024-02-25 DIAGNOSIS — G901 Familial dysautonomia [Riley-Day]: Secondary | ICD-10-CM

## 2024-02-25 MED ORDER — DOXYCYCLINE HYCLATE 100 MG PO CAPS: 100.0000 mg | ORAL_CAPSULE | Freq: Two times a day (BID) | ORAL | 0 refills | Status: AC

## 2024-02-25 MED ORDER — PAROXETINE HCL ER 25 MG PO TB24: ORAL_TABLET | ORAL | 1 refills | Status: AC

## 2024-02-25 MED ORDER — ZOLPIDEM TARTRATE ER 12.5 MG PO TBCR: 12.5000 mg | EXTENDED_RELEASE_TABLET | Freq: Every evening | ORAL | 0 refills | Status: AC | PRN

## 2024-02-25 MED ORDER — REPATHA SURECLICK 140 MG/ML ~~LOC~~ SOAJ: 140.0000 mg | SUBCUTANEOUS | 1 refills | Status: AC

## 2024-02-25 MED ORDER — CLOPIDOGREL BISULFATE 75 MG PO TABS: 75.0000 mg | ORAL_TABLET | Freq: Every day | ORAL | 1 refills | Status: AC

## 2024-02-25 MED ORDER — SEMAGLUTIDE (1 MG/DOSE) 4 MG/3ML ~~LOC~~ SOPN: 1.0000 mg | PEN_INJECTOR | SUBCUTANEOUS | 0 refills | Status: AC

## 2024-02-25 MED ORDER — ROSUVASTATIN CALCIUM 10 MG PO TABS: 10.0000 mg | ORAL_TABLET | Freq: Every day | ORAL | 1 refills | Status: AC

## 2024-02-25 MED ORDER — BENZONATATE 100 MG PO CAPS: 100.0000 mg | ORAL_CAPSULE | Freq: Three times a day (TID) | ORAL | 0 refills | Status: AC | PRN

## 2024-02-25 MED ORDER — ALPRAZOLAM ER 1 MG PO TB24: 1.0000 mg | ORAL_TABLET | ORAL | 0 refills | Status: AC

## 2024-02-25 MED ORDER — GABAPENTIN 100 MG PO CAPS: 100.0000 mg | ORAL_CAPSULE | Freq: Three times a day (TID) | ORAL | 1 refills | Status: AC

## 2024-02-25 NOTE — Progress Notes (Signed)
 Name: Alicia [name] Note Conyer   MRN: [id number]    DOB: 1970-03-09   Date:02/25/2024       Progress Note  Subjective  Chief Complaint  Chief Complaint  Patient presents with   Medical Management of Chronic Issues   Discussed the use of AI scribe software for clinical note transcription with the patient, who gave verbal consent to proceed.  History of Present Illness Alicia [name] Note Baik is a 54 year old female with recurrent strokes and dysautonomia who presents with a chronic cough.  She has been experiencing a chronic cough for approximately six weeks. The cough is productive, with yellowish, thick phlegm, and is often worse in the mornings and sometimes at night. No fever or chills, but a sore throat preceded the cough. The cough cycles with periods of sinus congestion and temporary relief before returning. She has been using Mucinex  three times a day to manage symptoms.  In November, she experienced stroke-like symptoms, including right side weakness and left side numbness, while at her brother's house. She was taken to [facility] where an MRI and CT scans were performed, showing no acute findings but a possible minor bleed. A CT of the abdomen and pelvis revealed mildly distended loops of small bowel with gastric distension. An EEG was normal, and an MRI of the abdomen showed a T2 hyperintense cystic lesion in the pancreatic head, consistent with a cystoid adenoma, which has been stable in size.  She has a history of recurrent strokes, with the first occurring in 2007 and a major stroke in 2018. She experiences persistent left side weakness and has been treated with Plavix  and rosuvastatin . She also takes lamotrigine  for seizure-like activity and gabapentin  for nerve pain. She reports muscle pain associated with rosuvastatin  use, she has also developed myopathy with atorvastatin    She has type 2 diabetes with dyslipidemia, managed with rosuvastatin  and Ozempic . Her A1c was normal in November,  and she reports significant weight loss from 182 lbs in October to 173 lbs currently. She experiences thirst but no other symptoms of diabetes.  She has chronic neck pain due to degenerative changes, managed with Flexeril . She also has a history of migraines, which are currently under control, and uses Ubrelvy  as needed. For anxiety and major depressive disorder, she takes Paxil  and Xanax  XR, which she reports are effective.  She has dysautonomia, which affects her ability to work, causing episodes of overwhelming fatigue and inability to drive. She takes mitrogene for heart rate management and reports needing accommodations at work due to these episodes.    Patient Active Problem List   Diagnosis Date Noted   Dyslipidemia associated with type 2 diabetes mellitus (HCC) 09/10/2023   B12 deficiency 09/10/2023   Recurrent strokes (HCC) 09/09/2022   PVC's (premature ventricular contractions) 09/09/2022   Dysautonomia (HCC) 04/03/2022   Hemiparesis of left nondominant side as late effect of cerebral infarction (HCC) 04/03/2022   Mild episode of recurrent major depressive disorder 04/03/2022   HLA B27 (HLA B27 positive) 04/03/2022   Essential hypertension 08/30/2021   COVID-19 long hauler manifesting chronic fatigue 03/21/2020   Orthostatic hypotension 02/23/2020   Atypical chest pain 02/23/2020   Reactive airways dysfunction syndrome (HCC) 10/15/2019   Hyperfunction of pituitary gland, unspecified 10/15/2019   Calculus of gallbladder without cholecystitis without obstruction 05/06/2019   Cyst of pancreas 05/06/2019   Right kidney stone 05/06/2019   Leukocytoclastic vasculitis (HCC) 10/07/2018   Chronic insomnia 02/09/2018   History of anemia 02/09/2018   Sleep  apnea 09/24/2017   History of ischemic stroke 05/30/2017   Hyperlipidemia 05/30/2017   Migraine without aura and without status migrainosus, not intractable 05/27/2017   Pain in right knee 01/21/2017   Psychogenic nonepileptic  seizure    Chronic pain syndrome    Restless leg syndrome    Chronic prescription benzodiazepine use 01/20/2016   Leukocytosis 01/20/2016   Seizure-like activity (HCC)    GAD (generalized anxiety disorder) 02/14/2015   Chronic neck pain 02/14/2015   History of hysterectomy 02/14/2015   Iron deficiency anemia due to chronic blood loss 08/29/2014   Insomnia, persistent 08/14/2014   Major depression (HCC) 08/14/2014   Temporomandibular joint sounds on opening and/or closing the jaw 08/14/2014   Degenerative disc disease, lumbar 08/14/2014   Bleeding internal hemorrhoids 08/14/2014   Gastric reflux 08/14/2014   Irritable bowel syndrome with constipation 08/14/2014   Hypertriglyceridemia 08/14/2014   Overweight 08/14/2014   Tinnitus 08/14/2014   Vitamin D  deficiency 08/14/2014   Sinus tachycardia 11/25/2012   DOE (dyspnea on exertion) 11/06/2012    Past Surgical History:  Procedure Laterality Date   ABDOMINOPLASTY  Feb. 2015   CESAREAN SECTION  2011   CHOLECYSTECTOMY N/A 05/31/2019   Procedure: LAPAROSCOPIC CHOLECYSTECTOMY WITH INTRAOPERATIVE CHOLANGIOGRAM;  Surgeon: Dessa Reyes ORN, MD;  Location: [facility] ORS;  Service: General;  Laterality: N/A;   COLONOSCOPY WITH PROPOFOL  N/A 11/26/2016   Procedure: COLONOSCOPY WITH PROPOFOL ;  Surgeon: Therisa Bi, MD;  Location: [facility] ENDOSCOPY;  Service: Gastroenterology;  Laterality: N/A;   COLONOSCOPY WITH PROPOFOL  N/A 08/08/2020   Procedure: COLONOSCOPY WITH PROPOFOL ;  Surgeon: Maryruth Ole DASEN, MD;  Location: [facility] ENDOSCOPY;  Service: Endoscopy;  Laterality: N/A;   CYSTOSCOPY  02/02/2015   Procedure: CYSTOSCOPY;  Surgeon: Lamar SHAUNNA Lesches, MD;  Location: [facility] ORS;  Service: Gynecology;;   DILATION AND CURETTAGE OF UTERUS  2003, 2005, 2008   ESOPHAGOGASTRODUODENOSCOPY N/A 05/31/2019   Procedure: ESOPHAGOGASTRODUODENOSCOPY (EGD);  Surgeon: Dessa Reyes ORN, MD;  Location: [facility] ORS;  Service: General;  Laterality: N/A;  in the O.R.    ESOPHAGOGASTRODUODENOSCOPY (EGD) WITH PROPOFOL  N/A 11/26/2016   Procedure: ESOPHAGOGASTRODUODENOSCOPY (EGD) WITH PROPOFOL ;  Surgeon: Therisa Bi, MD;  Location: [facility] ENDOSCOPY;  Service: Gastroenterology;  Laterality: N/A;   HERNIA REPAIR  1999   Umbilical   KNEE ARTHROSCOPY Right 2006   LAPAROSCOPIC BILATERAL SALPINGECTOMY Bilateral 02/02/2015   Procedure: LAPAROSCOPIC BILATERAL SALPINGECTOMY;  Surgeon: Lamar SHAUNNA Lesches, MD;  Location: [facility] ORS;  Service: Gynecology;  Laterality: Bilateral;   LAPAROSCOPIC HYSTERECTOMY N/A 02/02/2015   Procedure: HYSTERECTOMY TOTAL LAPAROSCOPIC;  Surgeon: Lamar SHAUNNA Lesches, MD;  Location: [facility] ORS;  Service: Gynecology;  Laterality: N/A;   LOOP RECORDER INSERTION N/A 12/20/2016   Procedure: LOOP RECORDER INSERTION;  Surgeon: Inocencio Soyla Lunger, MD;  Location: [facility] INVASIVE CV LAB;  Service: Cardiovascular;  Laterality: N/A;   REDUCTION MAMMAPLASTY Bilateral December 2015   TEE WITHOUT CARDIOVERSION N/A 12/20/2016   Procedure: TRANSESOPHAGEAL ECHOCARDIOGRAM (TEE);  Surgeon: Maranda Leim DEL, MD;  Location: [facility] ENDOSCOPY;  Service: Cardiovascular;  Laterality: N/A;   TEE WITHOUT CARDIOVERSION N/A 03/29/2020   Procedure: TRANSESOPHAGEAL ECHOCARDIOGRAM (TEE);  Surgeon: Darliss Rogue, MD;  Location: [facility] ORS;  Service: Cardiovascular;  Laterality: N/A;    Family History  Problem Relation Age of Onset   Hypertension Mother    Hyperlipidemia Mother    Heart Problems Father        hole in heart and lower ventricles reversed   Prostate cancer Maternal Grandfather    Von Willebrand disease Maternal Uncle  Social History   Tobacco Use   Smoking status: Never   Smokeless tobacco: Never  Substance Use Topics   Alcohol use: No    Alcohol/week: 0.0 standard drinks of alcohol    Current Medications[1]  Allergies[2]  I personally reviewed active problem list, medication list, allergies, family history with the patient/caregiver today.   ROS  Ten  systems reviewed and is negative except as mentioned in HPI    Objective Physical Exam  CONSTITUTIONAL: Patient appears well-developed and well-nourished.  No distress. HEENT: Head atraumatic, normocephalic, neck supple. CARDIOVASCULAR: Normal rate, regular rhythm and normal heart sounds.  No murmur heard. No BLE edema. PULMONARY: Effort normal and breath sounds normal. No respiratory distress. ABDOMINAL: There is no tenderness or distention. MUSCULOSKELETAL: Normal gait. Without gross motor or sensory deficit. PSYCHIATRIC: Patient has a normal mood and affect. behavior is normal. Judgment and thought content normal.  Vitals:   02/25/24 0757  BP: 110/74  Pulse: 98  Resp: 16  SpO2: 98%  Weight: 173 lb (78.5 kg)  Height: 5' 8 (1.727 m)    Body mass index is 26.3 kg/m.  Recent Results (from the past 2160 hours)  POCT glycosylated hemoglobin (Hb A1C)     Status: None   Collection Time: 12/05/23  9:52 AM  Result Value Ref Range   Hemoglobin A1C 5.6 4.0 - 5.6 %   HbA1c POC (<> result, manual entry)     HbA1c, POC (prediabetic range)     HbA1c, POC (controlled diabetic range)    Basic metabolic panel     Status: Abnormal   Collection Time: 12/15/23  2:11 PM  Result Value Ref Range   Sodium 140 135 - 145 mmol/L   Potassium 3.9 3.5 - 5.1 mmol/L   Chloride 103 98 - 111 mmol/L   CO2 23 22 - 32 mmol/L   Glucose, Bld 114 (H) 70 - 99 mg/dL    Comment: Glucose reference range applies only to samples taken after fasting for at least 8 hours.   BUN 14 6 - 20 mg/dL   Creatinine, Ser 8.87 (H) 0.44 - 1.00 mg/dL   Calcium  9.4 8.9 - 10.3 mg/dL   GFR, Estimated 59 (L) >60 mL/min    Comment: (NOTE) Calculated using the CKD-EPI Creatinine Equation (2021)    Anion gap 14 5 - 15    Comment: Performed at [facility], [address] [place], [place] [address]  CBC     Status: Abnormal   Collection Time: 12/15/23  2:11 PM  Result Value Ref Range   WBC 11.8 (H) 4.0 - 10.5 K/uL    RBC 4.62 3.87 - 5.11 MIL/uL   Hemoglobin 14.7 12.0 - 15.0 g/dL   HCT 58.2 63.9 - 53.9 %   MCV 90.3 80.0 - 100.0 fL   MCH 31.8 26.0 - 34.0 pg   MCHC 35.3 30.0 - 36.0 g/dL   RDW 87.5 88.4 - 84.4 %   Platelets 258 150 - 400 K/uL   nRBC 0.0 0.0 - 0.2 %    Comment: Performed at [facility], [address] [place], [place] [address]  Urinalysis, Routine w reflex microscopic -Urine, Clean Catch     Status: Abnormal   Collection Time: 12/15/23  2:14 PM  Result Value Ref Range   Color, Urine AMBER (A) YELLOW    Comment: BIOCHEMICALS MAY BE AFFECTED BY COLOR   APPearance HAZY (A) CLEAR   Specific Gravity, Urine 1.040 (H) 1.005 - 1.030   pH 5.0  5.0 - 8.0   Glucose, UA NEGATIVE NEGATIVE mg/dL   Hgb urine dipstick NEGATIVE NEGATIVE   Bilirubin Urine SMALL (A) NEGATIVE   Ketones, ur NEGATIVE NEGATIVE mg/dL   Protein, ur 30 (A) NEGATIVE mg/dL   Nitrite NEGATIVE NEGATIVE   Leukocytes,Ua NEGATIVE NEGATIVE   RBC / HPF 0-5 0 - 5 RBC/hpf   WBC, UA 11-20 0 - 5 WBC/hpf   Bacteria, UA RARE (A) NONE SEEN   Squamous Epithelial / HPF 0-5 0 - 5 /HPF   Mucus PRESENT    Hyaline Casts, UA PRESENT     Comment: Performed at [facility], [address], [place], [place] [address]  Urine Culture     Status: Abnormal   Collection Time: 12/15/23  2:14 PM   Specimen: Urine, Clean Catch  Result Value Ref Range   Specimen Description      URINE, CLEAN CATCH Performed at [facility], [address], [place], [place] [address]    Special Requests      NONE Performed at [facility], [address] [place], [place] [address]    Culture MULTIPLE SPECIES PRESENT, SUGGEST RECOLLECTION (A)    Report Status 12/16/2023 FINAL   Lactic acid, plasma     Status: None   Collection Time: 12/15/23  3:49 PM  Result Value Ref Range   Lactic Acid, Venous 1.8 0.5 - 1.9 mmol/L    Comment: Performed at [facility], [address] [place], [place] [address]  Blood  culture (routine x 2)     Status: None   Collection Time: 12/15/23  3:49 PM   Specimen: BLOOD  Result Value Ref Range   Specimen Description BLOOD BLOOD LEFT FOREARM    Special Requests      BOTTLES DRAWN AEROBIC AND ANAEROBIC Blood Culture adequate volume   Culture      NO GROWTH 5 DAYS Performed at [facility], [address], [place], [place] [address]    Report Status 12/20/2023 FINAL   Blood culture (routine x 2)     Status: None   Collection Time: 12/15/23  3:49 PM   Specimen: BLOOD  Result Value Ref Range   Specimen Description BLOOD LEFT ANTECUBITAL    Special Requests      BOTTLES DRAWN AEROBIC AND ANAEROBIC Blood Culture adequate volume   Culture      NO GROWTH 5 DAYS Performed at [facility], [address] [place], [place] [address]    Report Status 12/20/2023 FINAL   Resp panel by RT-PCR (RSV, Flu A&B, Covid) Anterior Nasal Swab     Status: None   Collection Time: 12/15/23  5:53 PM   Specimen: Anterior Nasal Swab  Result Value Ref Range   SARS Coronavirus 2 by RT PCR NEGATIVE NEGATIVE    Comment: (NOTE) SARS-CoV-2 target nucleic acids are NOT DETECTED.  The SARS-CoV-2 RNA is generally detectable in upper respiratory specimens during the acute phase of infection. The lowest concentration of SARS-CoV-2 viral copies this assay can detect is 138 copies/mL. A negative result does not preclude SARS-Cov-2 infection and should not be used as the sole basis for treatment or other patient management decisions. A negative result may occur with  improper specimen collection/handling, submission of specimen other than nasopharyngeal swab, presence of viral mutation(s) within the areas targeted by this assay, and inadequate number of viral copies(<138 copies/mL). A negative result must be combined with clinical observations, patient history, and epidemiological information. The expected result is  Negative.  Fact Sheet for Patients:   bloggercourse.com  Fact Sheet for Healthcare Providers:  seriousbroker.it  This test is no t yet approved or cleared by the [place]  FDA and  has been authorized for detection and/or diagnosis of SARS-CoV-2 by FDA under an Emergency Use Authorization (EUA). This EUA will remain  in effect (meaning this test can be used) for the duration of the COVID-19 declaration under Section 564(b)(1) of the Act, 21 U.S.C.section 360bbb-3(b)(1), unless the authorization is terminated  or revoked sooner.       Influenza A by PCR NEGATIVE NEGATIVE   Influenza B by PCR NEGATIVE NEGATIVE    Comment: (NOTE) The Xpert Xpress SARS-CoV-2/FLU/RSV plus assay is intended as an aid in the diagnosis of influenza from Nasopharyngeal swab specimens and should not be used as a sole basis for treatment. Nasal washings and aspirates are unacceptable for Xpert Xpress SARS-CoV-2/FLU/RSV testing.  Fact Sheet for Patients: bloggercourse.com  Fact Sheet for Healthcare Providers: seriousbroker.it  This test is not yet approved or cleared by the [place]  FDA and has been authorized for detection and/or diagnosis of SARS-CoV-2 by FDA under an Emergency Use Authorization (EUA). This EUA will remain in effect (meaning this test can be used) for the duration of the COVID-19 declaration under Section 564(b)(1) of the Act, 21 U.S.C. section 360bbb-3(b)(1), unless the authorization is terminated or revoked.     Resp Syncytial Virus by PCR NEGATIVE NEGATIVE    Comment: (NOTE) Fact Sheet for Patients: bloggercourse.com  Fact Sheet for Healthcare Providers: seriousbroker.it  This test is not yet approved or cleared by the [place]  FDA and has been authorized for detection and/or diagnosis of SARS-CoV-2 by FDA under an Emergency Use Authorization (EUA).  This EUA will remain in effect (meaning this test can be used) for the duration of the COVID-19 declaration under Section 564(b)(1) of the Act, 21 U.S.C. section 360bbb-3(b)(1), unless the authorization is terminated or revoked.  Performed at [facility], [address] [place], [place] [address]     Diabetic Foot Exam:     PHQ2/9:    12/05/2023    9:36 AM 09/10/2023    8:32 AM 06/18/2023    7:56 AM 03/26/2023    8:23 AM 12/24/2022    7:45 AM  Depression screen PHQ 2/9  Decreased Interest 1 0 0 0 0  Down, Depressed, Hopeless 1 0 0 0 0  PHQ - 2 Score 2 0 0 0 0  Altered sleeping 0  0 0   Tired, decreased energy 1  0 0   Change in appetite 0  0 0   Feeling bad or failure about yourself  0  0 0   Trouble concentrating 1  0 0   Moving slowly or fidgety/restless 0  0 0   Suicidal thoughts 0  0 0   PHQ-9 Score 4   0  0    Difficult doing work/chores Somewhat difficult  Not difficult at all Not difficult at all      Data saved with a previous flowsheet row definition    phq 9 is positive  Fall Risk:    02/25/2024    7:53 AM 12/05/2023    9:36 AM 09/10/2023    8:32 AM 03/26/2023    8:20 AM 12/24/2022    7:45 AM  Fall Risk   Falls in the past year? 0 0 0 0 0  Number falls in past yr: 0 0 0 0 0  Injury with  Fall? 0 0  0  0  0   Risk for fall due to : No Fall Risks No Fall Risks No Fall Risks No Fall Risks No Fall Risks  Follow up Falls evaluation completed Falls evaluation completed Falls evaluation completed Falls prevention discussed;Education provided;Falls evaluation completed Falls prevention discussed     Data saved with a previous flowsheet row definition     Assessment & Plan Chronic cough Persisting for six weeks with yellowish, thick phlegm, worse in the mornings and at night. Likely atypical pneumonia. - Ordered chest x-ray. - Prescribed doxycycline  for 5 days. - Prescribed Tessalon  Perles for cough suppression. - Continue Mucinex  three times a  day.  Type 2 diabetes mellitus with dyslipidemia Type 2 diabetes with dyslipidemia. Recent A1c normal. Reports increased thirst. Weight decreased from 182 to 173 lbs, BMI now 26. Rosuvastatin  causing muscle pain, indicating statin myopathy. - Continue Ozempic . - Switched from rosuvastatin  to Repatha  due to statin myopathy. - Advised to obtain vouchers for Repatha  to reduce cost.  Recurrent cerebral infarctions with left hemiparesis Recurrent strokes with persistent left hemiparesis. Last episode in November with no acute findings on imaging. - Continue Plavix . - Continue lamotrigine . - Continue gabapentin  for nerve pain.  Dysautonomia Affecting daily activities and work. Requires accommodations due to episodes of overwhelming stress and inability to function. - Discuss work accommodations with employer. - Consider documentation from neurologist for work accommodations.  Seizure-like activity Managed with lamotrigine . - Continue lamotrigine .  Chronic neck pain with radiculopathy Chronic neck pain with radiculopathy, managed with Flexeril . - Continue Flexeril .  Statin myopathy Muscle pain associated with rosuvastatin  use. Plan to switch to Repatha  due to intolerance to statins. - Switched from rosuvastatin  to Repatha .  Chronic insomnia Managed with ENCR. - Continue ENCR.  Generalized anxiety disorder and recurrent major depressive disorder Managed with Paxil  and Xanax  XR. - Continue Paxil . - Continue Xanax  XR.  Migraine Headaches well-controlled with Ubrelvy , primarily triggered by hormonal changes. - Continue Ubrelvy  as needed.  Reactive airway disease Managed with Breo. Cough persists despite use. - Continue Breo.  Pancreatic cystic lesion T2 hyperintense cystic lesion in the pancreatic head, likely a cystoid adenoma. No change in size. - Continue monitoring pancreatic cystic lesion.        [1]  Current Outpatient Medications:    ALPRAZolam  (XANAX  XR) 1 MG 24  hr tablet, Take 1 tablet (1 mg total) by mouth every morning., Disp: 90 tablet, Rfl: 0   cephALEXin  (KEFLEX ) 500 MG capsule, Take 1 capsule (500 mg total) by mouth 2 (two) times daily., Disp: 14 capsule, Rfl: 0   clopidogrel  (PLAVIX ) 75 MG tablet, Take 1 tablet (75 mg total) by mouth daily., Disp: 90 tablet, Rfl: 1   cyclobenzaprine  (FLEXERIL ) 10 MG tablet, Take 1 tablet (10 mg total) by mouth at bedtime., Disp: 90 tablet, Rfl: 1   Dapsone  (ACZONE ) 7.5 % GEL, Apply 1 Application topically as directed. qd to bid for acne and perioral dermatitis, Disp: 60 g, Rfl: 11   fluticasone  furoate-vilanterol (BREO ELLIPTA ) 100-25 MCG/ACT AEPB, Inhale 1 puff into the lungs daily., Disp: 180 each, Rfl: 1   gabapentin  (NEURONTIN ) 100 MG capsule, TAKE 1 CAPSULE (100 MG TOTAL) BY MOUTH THREE TIMES DAILY., Disp: 270 capsule, Rfl: 1   lamoTRIgine  (LAMICTAL ) 100 MG tablet, Take 100 mg by mouth 2 (two) times daily., Disp: , Rfl:    midodrine  (PROAMATINE ) 5 MG tablet, TAKE 1 TABLET BY MOUTH THREE TIMES DAILY AT 7 AM, 11 AM, AND 3 PM,  Disp: 270 tablet, Rfl: 3   pantoprazole  (PROTONIX ) 20 MG tablet, Take 1 tablet (20 mg total) by mouth daily., Disp: 90 tablet, Rfl: 1   PARoxetine  (PAXIL -CR) 25 MG 24 hr tablet, TAKE 2 TABLET BY MOUTH DAILY, Disp: 180 tablet, Rfl: 1   rosuvastatin  (CRESTOR ) 10 MG tablet, Take 1 tablet (10 mg total) by mouth at bedtime., Disp: 90 tablet, Rfl: 1   Semaglutide , 1 MG/DOSE, 4 MG/3ML SOPN, Inject 1 mg as directed once a week., Disp: 9 mL, Rfl: 0   UBRELVY  100 MG TABS, Take 100 mg by mouth daily as needed (migraine). , Disp: , Rfl:    Vitamin D , Ergocalciferol , (DRISDOL ) 1.25 MG (50000 UNIT) CAPS capsule, Take 50,000 Units by mouth once a week., Disp: , Rfl:    zolpidem  (AMBIEN  CR) 12.5 MG CR tablet, Take 1 tablet (12.5 mg total) by mouth at bedtime as needed for sleep., Disp: 90 tablet, Rfl: 0 [2]  Allergies Allergen Reactions   Azithromycin  Diarrhea   Penicillins Hives and Swelling    Has  patient had a PCN reaction causing immediate rash, facial/tongue/throat swelling, SOB or lightheadedness with hypotension: Yes Has patient had a PCN reaction causing severe rash involving mucus membranes or skin necrosis: No Has patient had a PCN reaction that required hospitalization No Has patient had a PCN reaction occurring within the last 10 years: No If all of the above answers are NO, then may proceed with Cephalosporin use.

## 2024-02-25 NOTE — Telephone Encounter (Signed)
 Pharmacy Patient Advocate Encounter  //Received notification from [employer] that Prior Authorization for Ozempic  (0.25 or 0.5 MG/DOSE) 2MG /3ML pen-injectors has been APPROVED from 02/25/24 to 02/24/25. Unable to obtain price due to refill too soon rejection, last fill date 02/03/24 next available fill date 04/06/24   PA #/Case ID/Reference #: [id number]

## 2024-02-25 NOTE — Telephone Encounter (Signed)
 Pharmacy Patient Advocate Encounter   Received notification from [facility] KEY that prior authorization for Ozempic  (0.25 or 0.5 MG/DOSE) 2MG /3ML pen-injectors is due for renewal.   Insurance verification completed.   The patient is insured through [facility].  Action: PA required; PA started via CoverMyMeds. KEY [id number] . Waiting for clinical questions to populate.

## 2024-03-12 ENCOUNTER — Other Ambulatory Visit: Payer: Self-pay | Admitting: Family Medicine

## 2024-03-12 NOTE — Telephone Encounter (Signed)
 Requested medications are due for refill today.  yes  Requested medications are on the active medications list.  yes  Last refill. 03/17/2023 #270 3 rf  Future visit scheduled.   yes  Notes to clinic.  Rx signed by Dr. Fernande    Requested Prescriptions  Pending Prescriptions Disp Refills   midodrine  (PROAMATINE ) 5 MG tablet [Pharmacy Med Name: MIDODRINE  HCL 5 MG TABLET] 270 tablet 3    Sig: TAKE 1 TABLET BY MOUTH THREE TIMES DAILY (7:00 AM, 11:00 AM, & 3:00 PM)     Cardiovascular: Hypotension - midodrine  Failed - 03/12/2024 12:14 PM      Failed - Valid encounter within last 12 months    Recent Outpatient Visits           1 year ago    [facility] at [employer] [facility] [employer]. [facility] [place], Aldona BRAVO, RN   2 years ago    [facility] at [employer] [facility] [employer]. [facility] Mychart, Generic Provider   2 years ago    [facility] at [employer]. [facility] Darliss Rogue, MD      Future Appointments             In 2 months Claudene Lehmann, MD [facility]   In 2 months Sowles, [name], MD [facility], [facility]            Failed - Cr in normal range and within 365 days    Creat  Date Value Ref Range Status  09/10/2023 1.08 (H) 0.50 - 1.03 mg/dL Final   Creatinine, Ser  Date Value Ref Range Status  12/15/2023 1.12 (H) 0.44 - 1.00 mg/dL Final   Creatinine, Urine  Date Value Ref Range Status  09/10/2023 365 (H) 20 - 275 mg/dL Final    Comment:    Verified by repeat analysis. .          Passed - ALT in normal range and within 365 days    ALT  Date Value Ref Range Status  09/10/2023 14 6 - 29 U/L Final         Passed - AST in normal range and within 365 days    AST  Date Value Ref Range Status  09/10/2023 13 10 - 35 U/L Final

## 2024-03-21 ENCOUNTER — Other Ambulatory Visit: Payer: Self-pay | Admitting: Family Medicine

## 2024-03-21 DIAGNOSIS — E559 Vitamin D deficiency, unspecified: Secondary | ICD-10-CM

## 2024-03-26 ENCOUNTER — Ambulatory Visit: Admitting: Cardiology

## 2024-03-26 VITALS — BP 104/66 | HR 96 | Resp 96 | Ht 69.0 in | Wt 171.0 lb

## 2024-03-26 DIAGNOSIS — I639 Cerebral infarction, unspecified: Secondary | ICD-10-CM

## 2024-03-26 DIAGNOSIS — I4711 Inappropriate sinus tachycardia, so stated: Secondary | ICD-10-CM

## 2024-03-26 DIAGNOSIS — E782 Mixed hyperlipidemia: Secondary | ICD-10-CM

## 2024-03-26 DIAGNOSIS — R42 Dizziness and giddiness: Secondary | ICD-10-CM

## 2024-03-26 MED ORDER — EZETIMIBE 10 MG PO TABS: 10.0000 mg | ORAL_TABLET | Freq: Every day | ORAL | 3 refills | Status: AC

## 2024-03-26 MED ORDER — MIDODRINE HCL 5 MG PO TABS: ORAL_TABLET | ORAL | 3 refills | Status: AC

## 2024-03-26 NOTE — Progress Notes (Signed)
 " Cardiology Office Note:    Date:  03/26/2024   ID:  Alicia [name] Note Alicia [name], Alicia [name] 05-10-1970, MRN [id number]  PCP:  Sowles, [name], MD  [facility] Cardiologist:  Redell Cave, MD  [facility] Electrophysiologist:  None   Referring MD: Glenard Mire, MD   Chief Complaint  Patient presents with   12 month follow up    Doing okay since last visit      History of Present Illness:    Alicia [name] Note Alicia [name] is a 54 y.o. female with a hx of anxiety, hyperlipidemia, orthostasis, inappropriate sinus tach, CVA, TIA s/p ILR 2018 who presents for follow-up.    Patient overall feeling well, episodes of dizziness and elevated heart rates have improved since starting midodrine .  Denies any syncope.  Did not tolerate higher doses of Crestor , currently on 20 mg daily.  Repatha  was prescribed but cost was too high.  Prior notes Echo 07/2022 EF 60 to 65%. Cardiac monitor 09/2022 no A-fib or flutter. CT cardiac scoring 07/2022, calcium  score 0. Lexiscan  Myoview  05/2021 no significant ischemia. Patient first diagnosed with strokes in 2018.  At that time, she had dizziness, nausea, left-sided weakness.  Also has a history of seizures.  Had another episode of CVA in 2019 with left-sided arm and leg weakness.  Loop recorder was placed in 2018, check showed no evidence of A. fib or flutter. Did not tolerate beta-blocker due to orthostasis, insurance did not cover ivabradine . Echo 02/2020  EF 60-65, impaired relaxation.  Past Medical History:  Diagnosis Date   Anemia    Anxiety    Depression    Dyspnea    SINCE STROKE   GERD (gastroesophageal reflux disease)    Hyperlipidemia    Hypertension    IBS (irritable bowel syndrome)    Migraine    Previous cesarean delivery, delivered, with or without mention of antepartum condition 01/19/2012   Psychogenic nonepileptic seizure    hx/notes 12/19/2016-LAST SEIZURE IN 2019-ON NO MEDS AS OF 05-24-19   Restless leg syndrome    Sleep apnea    USES CPAP    Stroke (HCC) 12/18/2016   Acute arterial ischemic stroke, multifocal, posterior circulation /notes 12/19/2016-LEFT SIDED WEAKNESS, MEMORY FATIGUE AND TROUBLE FINDING HER WORDS [facility]    Past Surgical History:  Procedure Laterality Date   ABDOMINOPLASTY  Feb. 2015   CESAREAN SECTION  2011   CHOLECYSTECTOMY N/A 05/31/2019   Procedure: LAPAROSCOPIC CHOLECYSTECTOMY WITH INTRAOPERATIVE CHOLANGIOGRAM;  Surgeon: Dessa Reyes ORN, MD;  Location: [facility] ORS;  Service: General;  Laterality: N/A;   COLONOSCOPY WITH PROPOFOL  N/A 11/26/2016   Procedure: COLONOSCOPY WITH PROPOFOL ;  Surgeon: Therisa Bi, MD;  Location: [facility] ENDOSCOPY;  Service: Gastroenterology;  Laterality: N/A;   COLONOSCOPY WITH PROPOFOL  N/A 08/08/2020   Procedure: COLONOSCOPY WITH PROPOFOL ;  Surgeon: Maryruth Ole DASEN, MD;  Location: [facility] ENDOSCOPY;  Service: Endoscopy;  Laterality: N/A;   CYSTOSCOPY  02/02/2015   Procedure: CYSTOSCOPY;  Surgeon: Lamar SHAUNNA Lesches, MD;  Location: [facility] ORS;  Service: Gynecology;;   DILATION AND CURETTAGE OF UTERUS  2003, 2005, 2008   ESOPHAGOGASTRODUODENOSCOPY N/A 05/31/2019   Procedure: ESOPHAGOGASTRODUODENOSCOPY (EGD);  Surgeon: Dessa Reyes ORN, MD;  Location: [facility] ORS;  Service: General;  Laterality: N/A;  in the O.R.   ESOPHAGOGASTRODUODENOSCOPY (EGD) WITH PROPOFOL  N/A 11/26/2016   Procedure: ESOPHAGOGASTRODUODENOSCOPY (EGD) WITH PROPOFOL ;  Surgeon: Therisa Bi, MD;  Location: [facility] ENDOSCOPY;  Service: Gastroenterology;  Laterality: N/A;   HERNIA REPAIR  1999   Umbilical   KNEE ARTHROSCOPY Right  04/17/2004   LAPAROSCOPIC BILATERAL SALPINGECTOMY Bilateral 02/02/2015   Procedure: LAPAROSCOPIC BILATERAL SALPINGECTOMY;  Surgeon: Lamar SHAUNNA Lesches, MD;  Location: [facility] ORS;  Service: Gynecology;  Laterality: Bilateral;   LAPAROSCOPIC HYSTERECTOMY N/A 02/02/2015   Procedure: HYSTERECTOMY TOTAL LAPAROSCOPIC;  Surgeon: Lamar SHAUNNA Lesches, MD;  Location: [facility] ORS;  Service: Gynecology;  Laterality: N/A;   LOOP RECORDER  INSERTION N/A 12/20/2016   Procedure: LOOP RECORDER INSERTION;  Surgeon: Inocencio Soyla Lunger, MD;  [facility]: [facility] INVASIVE CV LAB;  Service: Cardiovascular;  Laterality: N/A;   REDUCTION MAMMAPLASTY Bilateral December 2015   TEE WITHOUT CARDIOVERSION N/A 12/20/2016   Procedure: TRANSESOPHAGEAL ECHOCARDIOGRAM (TEE);  Surgeon: Maranda Leim DEL, MD;  Location: [facility] ENDOSCOPY;  Service: Cardiovascular;  Laterality: N/A;   TEE WITHOUT CARDIOVERSION N/A 04/17/2020   Procedure: TRANSESOPHAGEAL ECHOCARDIOGRAM (TEE);  Surgeon: Darliss Rogue, MD;  Location: [facility] ORS;  Service: Cardiovascular;  Laterality: N/A;    Current Medications: Current Meds  Medication Sig   ALPRAZolam  (XANAX  XR) 1 MG 24 hr tablet Take 1 tablet (1 mg total) by mouth every morning.   clopidogrel  (PLAVIX ) 75 MG tablet Take 1 tablet (75 mg total) by mouth daily.   cyclobenzaprine  (FLEXERIL ) 10 MG tablet Take 1 tablet (10 mg total) by mouth at bedtime.   Dapsone  (ACZONE ) 7.5 % GEL Apply 1 Application topically as directed. qd to bid for acne and perioral dermatitis   ezetimibe  (ZETIA ) 10 MG tablet Take 1 tablet (10 mg total) by mouth daily.   fluticasone  furoate-vilanterol (BREO ELLIPTA ) 100-25 MCG/ACT AEPB Inhale 1 puff into the lungs daily.   gabapentin  (NEURONTIN ) 100 MG capsule Take 1 capsule (100 mg total) by mouth 3 (three) times daily.   lamoTRIgine  (LAMICTAL ) 100 MG tablet Take 100 mg by mouth 2 (two) times daily.   pantoprazole  (PROTONIX ) 20 MG tablet Take 1 tablet (20 mg total) by mouth daily.   PARoxetine  (PAXIL -CR) 25 MG 24 hr tablet TAKE 2 TABLET BY MOUTH DAILY   rosuvastatin  (CRESTOR ) 10 MG tablet Take 1 tablet (10 mg total) by mouth at bedtime.   Semaglutide , 1 MG/DOSE, 4 MG/3ML SOPN Inject 1 mg as directed once a week.   UBRELVY  100 MG TABS Take 100 mg by mouth daily as needed (migraine).    zolpidem  (AMBIEN  CR) 12.5 MG CR tablet Take 1 tablet (12.5 mg total) by mouth at bedtime as needed for sleep.   [DISCONTINUED]  midodrine  (PROAMATINE ) 5 MG tablet TAKE 1 TABLET BY MOUTH THREE TIMES DAILY AT 7 AM, 11 AM, AND 3 PM     Allergies:   Azithromycin  and Penicillins   Social History   Socioeconomic History   Marital status: Married    Spouse name: John    Number of children: 4   Years of education: Not on file   Highest education level: Associate degree: occupational, scientist, product/process development, or vocational program  Occupational History   Occupation: Agricultural Engineer: [employer]  Tobacco Use   Smoking status: Never   Smokeless tobacco: Never  Vaping Use   Vaping status: Never Used  Substance and Sexual Activity   Alcohol use: No    Alcohol/week: 0.0 standard drinks of alcohol   Drug use: No   Sexual activity: Yes    Partners: Male  Other Topics Concern   Not on file  Social History Narrative   First husband died suddenly in 04-17-14   Remarried 06/07/2016   Social Drivers of Health   Tobacco Use: Low Risk  (03/04/2024)   Received  from [facility]   Patient History    Smoking Tobacco Use: Never    Smokeless Tobacco Use: Never    Passive Exposure: Not on file  Financial Resource Strain: Low Risk  (12/15/2023)   Received from [facility]   Overall Financial Resource Strain (CARDIA)    Difficulty of Paying Living Expenses: Not hard at all  Food Insecurity: No Food Insecurity (12/20/2023)   Received from [facility]   Epic    Within the past 12 months, you worried that your food would run out before you got the money to buy more.: Never true    Within the past 12 months, the food you bought just didn't last and you didn't have money to get more.: Never true  Transportation Needs: No Transportation Needs (12/20/2023)   Received from [facility]    In the past 12 months, has lack of transportation kept you from medical appointments or from getting medications?: No    In the past 12 months, has lack of transportation kept you from meetings, work, or  from getting things needed for daily living?: No  Physical Activity: Insufficiently Active (10/01/2022)   Exercise Vital Sign    Days of Exercise per Week: 4 days    Minutes of Exercise per Session: 30 min  Stress: Stress Concern Present (12/20/2023)   Received from [facility] - Occupational Stress Questionnaire    Do you feel stress - tense, restless, nervous, or anxious, or unable to sleep at night because your mind is troubled all the time - these days?: To some extent  Social Connections: Unknown (10/01/2022)   Social Connection and Isolation Panel    Frequency of Communication with Friends and Family: More than three times a week    Frequency of Social Gatherings with Friends and Family: Twice a week    Attends Religious Services: Patient declined    Database Administrator or Organizations: No    Attends Engineer, Structural: Not on file    Marital Status: Married  Depression (PHQ2-9): Low Risk (12/05/2023)   Depression (PHQ2-9)    PHQ-2 Score: 4  Alcohol Screen: Low Risk (09/06/2021)   Alcohol Screen    Last Alcohol Screening Score (AUDIT): 0  Housing: Low Risk (12/20/2023)   Received from [facility]    In the last 12 months, was there a time when you were not able to pay the mortgage or rent on time?: No    In the past 12 months, how many times have you moved where you were living?: 0    At any time in the past 12 months, were you homeless or living in a shelter (including now)?: No  Utilities: Not At Risk (12/20/2023)   Received from [facility]    In the past 12 months has the electric, gas, oil, or water company threatened to shut off services in your home?: No  Health Literacy: Not on file     Family History: The patient's family history includes Heart Problems in her father; Hyperlipidemia in her mother; Hypertension in her mother; Prostate cancer in her maternal grandfather; Von Willebrand disease in  her maternal uncle.  ROS:   Please see the history of present illness.     All other systems reviewed and are negative.  EKGs/Labs/Other Studies Reviewed:    The following studies were reviewed today:   EKG:  EKG is  ordered today.  EKG shows normal sinus rhythm, normal ECG, heart rate 93  Recent Labs: 09/10/2023: ALT 14 12/15/2023: BUN 14; Creatinine, Ser 1.12; Hemoglobin 14.7; Platelets 258; Potassium 3.9; Sodium 140  Recent Lipid Panel    Component Value Date/Time   CHOL 242 (H) 09/10/2023 0941   CHOL 231 (H) 08/24/2021 1355   TRIG 389 (H) 09/10/2023 0941   HDL 38 (L) 09/10/2023 0941   HDL 40 08/24/2021 1355   CHOLHDL 6.4 (H) 09/10/2023 0941   VLDL 50 (H) 08/30/2021 0416   LDLCALC 144 (H) 09/10/2023 0941     Risk Assessment/Calculations:      Physical Exam:    VS:  BP 104/66 (BP Location: Left Arm, Patient Position: Sitting, Cuff Size: Normal)   Pulse 96   Resp (!) 96   Ht 5' 9 (1.753 m)   Wt 171 lb (77.6 kg)   LMP 01/15/2015   BMI 25.25 kg/m     Wt Readings from Last 3 Encounters:  03/26/24 171 lb (77.6 kg)  02/25/24 173 lb (78.5 kg)  12/15/23 178 lb (80.7 kg)     GEN:  Well nourished, well developed in no acute distress HEENT: Normal NECK: No JVD; No carotid bruits LYMPHATICS: No lymphadenopathy CARDIAC: Regular rate and rhythm RESPIRATORY:  Clear to auscultation without rales, wheezing or rhonchi  ABDOMEN: Soft, non-tender, non-distended MUSCULOSKELETAL:  No edema; No deformity  SKIN: Warm and dry NEUROLOGIC:  Alert and oriented x 3 PSYCHIATRIC:  Normal affect   ASSESSMENT:    1. Cerebrovascular accident (CVA), unspecified mechanism (HCC)   2. Inappropriate sinus tachycardia   3. Orthostatic dizziness   4. Mixed hyperlipidemia     PLAN:    In order of problems listed above:  History of CVA, TIA in the past s/p ILR.  No A-fib or flutter noted on ILR.  Continue Plavix  75 mg daily, Crestor  10 mg daily, start Zetia ..  Did not tolerate  Lipitor  in the past, tolerating low-dose Crestor . Inappropriate sinus tach, heart rate reasonable, currently off digoxin . She failed management with beta-blockers due to worsening orthostasis.  Ivabradine  not covered by insurance.   Orthostasis, dizziness, improved with midodrine .  Continue midodrine  5 mg 3 times daily. Hyperlipidemia, hypertriglyceridemia, cholesterol not controlled.  Start Zetia  10 mg daily, continue Crestor  10 mg daily.  Repeat lipid panel in 5 months.  Consider adding bempedoic acid if cholesterol still well-controlled.  Follow-up in 6 months.   Medication Adjustments/Labs and Tests Ordered: Current medicines are reviewed at length with the patient today.  Concerns regarding medicines are outlined above.  Orders Placed This Encounter  Procedures   Lipid panel   EKG 12-Lead    Meds ordered this encounter  Medications   ezetimibe  (ZETIA ) 10 MG tablet    Sig: Take 1 tablet (10 mg total) by mouth daily.    Dispense:  90 tablet    Refill:  3   midodrine  (PROAMATINE ) 5 MG tablet    Sig: TAKE 1 TABLET BY MOUTH THREE TIMES DAILY AT 7 AM, 11 AM, AND 3 PM    Dispense:  270 tablet    Refill:  3     Patient Instructions  Medication Instructions:  - START zetia  10 mg daily   *If you need a refill on your cardiac medications before your next appointment, please call your pharmacy*  Lab Work: Your provider would like for you to return in 5 months to have the following labs drawn: fasting lipid panel.  Please go to [facility] [address] ([facility]) [address], [place] [address] You do not need an appointment.  They are open from 8 am- 4:30 pm.  Lunch from 1:00 pm- 2:00 pm You DO need to be fasting.  You may also go to one of the following [facility]:  [address] [place], [place] [address] Phone: [phone] Lab hours: Mon-Fri 8 am- 5 pm    Lunch 12 pm- 1 pm  [address] [place],  [place]  [address]  [place]  Phone: [phone] Lab  hours: 7 am- 4 pm Lunch 12 pm-1 pm   [address] [place],  [place]  [address]  [place]  Phone: [phone] Lab hours: Mon-Fri 8 am- 5 pm    Lunch 12 pm- 1 pm  If you have labs (blood work) drawn today and your tests are completely normal, you will receive your results only by: MyChart Message (if you have MyChart) OR A paper copy in the mail If you have any lab test that is abnormal or we need to change your treatment, we will call you to review the results.  Testing/Procedures: No test ordered today   Follow-Up: At [facility], you and your health needs are our priority.  As part of our continuing mission to provide you with exceptional heart care, our providers are all part of one team.  This team includes your primary Cardiologist (physician) and Advanced Practice Providers or APPs (Physician Assistants and Nurse Practitioners) who all work together to provide you with the care you need, when you need it.  Your next appointment:   6 month(s)  Provider:   You may see Redell Cave, MD or one of the following Advanced Practice Providers on your designated Care Team:   Lonni Meager, NP Lesley Maffucci, PA-C Bernardino Bring, PA-C Cadence [place], PA-C Tylene Lunch, NP Barnie Hila, NP    We recommend signing up for the patient portal called MyChart.  Sign up information is provided on this After Visit Summary.  MyChart is used to connect with patients for Virtual Visits (Telemedicine).  Patients are able to view lab/test results, encounter notes, upcoming appointments, etc.  Non-urgent messages can be sent to your provider as well.   To learn more about what you can do with MyChart, go to [email or web address].              Signed, Redell Cave, MD  03/26/2024 8:56 AM    [facility] "

## 2024-03-26 NOTE — Patient Instructions (Signed)
 Medication Instructions:  - START zetia  10 mg daily   *If you need a refill on your cardiac medications before your next appointment, please call your pharmacy*  Lab Work: Your provider would like for you to return in 5 months to have the following labs drawn: fasting lipid panel.   Please go to [facility] [address] ([facility]) [address], [place] [address] You do not need an appointment.  They are open from 8 am- 4:30 pm.  Lunch from 1:00 pm- 2:00 pm You DO need to be fasting.  You may also go to one of the following [facility]:  [address] [place], [place] [address] Phone: [phone] Lab hours: Mon-Fri 8 am- 5 pm    Lunch 12 pm- 1 pm  [address] [place],  [place]  [address]  [place]  Phone: [phone] Lab hours: 7 am- 4 pm Lunch 12 pm-1 pm   [address] [place],  [place]  [address]  [place]  Phone: [phone] Lab hours: Mon-Fri 8 am- 5 pm    Lunch 12 pm- 1 pm  If you have labs (blood work) drawn today and your tests are completely normal, you will receive your results only by: MyChart Message (if you have MyChart) OR A paper copy in the mail If you have any lab test that is abnormal or we need to change your treatment, we will call you to review the results.  Testing/Procedures: No test ordered today   Follow-Up: At [facility], you and your health needs are our priority.  As part of our continuing mission to provide you with exceptional heart care, our providers are all part of one team.  This team includes your primary Cardiologist (physician) and Advanced Practice Providers or APPs (Physician Assistants and Nurse Practitioners) who all work together to provide you with the care you need, when you need it.  Your next appointment:   6 month(s)  Provider:   You may see Redell Cave, MD or one of the following Advanced Practice Providers on your designated Care Team:   Lonni Meager, NP Lesley Maffucci, PA-C Bernardino Bring,  PA-C Cadence [place], PA-C Tylene Lunch, NP Barnie Hila, NP    We recommend signing up for the patient portal called MyChart.  Sign up information is provided on this After Visit Summary.  MyChart is used to connect with patients for Virtual Visits (Telemedicine).  Patients are able to view lab/test results, encounter notes, upcoming appointments, etc.  Non-urgent messages can be sent to your provider as well.   To learn more about what you can do with MyChart, go to [email or web address].

## 2024-05-13 ENCOUNTER — Ambulatory Visit: Admitting: Dermatology

## 2024-05-17 ENCOUNTER — Ambulatory Visit: Admitting: Family Medicine
